# Patient Record
Sex: Female | Born: 1955
Health system: Southern US, Community
[De-identification: ages and names within clinical notes are randomized; demographics above are authoritative.]

## PROBLEM LIST (undated history)

## (undated) DIAGNOSIS — G8929 Other chronic pain: Secondary | ICD-10-CM

## (undated) DIAGNOSIS — I509 Heart failure, unspecified: Secondary | ICD-10-CM

## (undated) DIAGNOSIS — R109 Unspecified abdominal pain: Secondary | ICD-10-CM

## (undated) DIAGNOSIS — E119 Type 2 diabetes mellitus without complications: Secondary | ICD-10-CM

## (undated) DIAGNOSIS — M1990 Unspecified osteoarthritis, unspecified site: Secondary | ICD-10-CM

## (undated) DIAGNOSIS — M545 Low back pain, unspecified: Secondary | ICD-10-CM

## (undated) DIAGNOSIS — M1612 Unilateral primary osteoarthritis, left hip: Secondary | ICD-10-CM

## (undated) DIAGNOSIS — R37 Sexual dysfunction, unspecified: Secondary | ICD-10-CM

## (undated) DIAGNOSIS — I219 Acute myocardial infarction, unspecified: Secondary | ICD-10-CM

## (undated) DIAGNOSIS — Z0000 Encounter for general adult medical examination without abnormal findings: Secondary | ICD-10-CM

## (undated) DIAGNOSIS — I10 Essential (primary) hypertension: Secondary | ICD-10-CM

## (undated) DIAGNOSIS — N183 Chronic kidney disease, stage 3 unspecified: Secondary | ICD-10-CM

## (undated) DIAGNOSIS — IMO0001 Reserved for inherently not codable concepts without codable children: Secondary | ICD-10-CM

## (undated) DIAGNOSIS — J449 Chronic obstructive pulmonary disease, unspecified: Secondary | ICD-10-CM

## (undated) DIAGNOSIS — I739 Peripheral vascular disease, unspecified: Secondary | ICD-10-CM

## (undated) DIAGNOSIS — E7800 Pure hypercholesterolemia, unspecified: Secondary | ICD-10-CM

## (undated) DIAGNOSIS — J101 Influenza due to other identified influenza virus with other respiratory manifestations: Secondary | ICD-10-CM

## (undated) HISTORY — DX: Unilateral primary osteoarthritis, left hip: M16.12

## (undated) HISTORY — DX: Influenza due to other identified influenza virus with other respiratory manifestations: J10.1

## (undated) HISTORY — PX: EYE SURGERY: SHX253

## (undated) HISTORY — DX: Reserved for inherently not codable concepts without codable children: IMO0001

## (undated) HISTORY — PX: JOINT REPLACEMENT: SHX530

## (undated) HISTORY — PX: TUBAL LIGATION: SHX77

## (undated) HISTORY — DX: Chronic kidney disease, stage 3 unspecified: N18.30

## (undated) HISTORY — DX: Unspecified abdominal pain: R10.9

## (undated) HISTORY — DX: Encounter for general adult medical examination without abnormal findings: Z00.00

## (undated) HISTORY — DX: Sexual dysfunction, unspecified: R37

## (undated) HISTORY — PX: PERIPHERAL VASCULAR CATHETERIZATION: SHX172C

---

## 1998-01-31 ENCOUNTER — Emergency Department (HOSPITAL_COMMUNITY): Admission: EM | Admit: 1998-01-31 | Discharge: 1998-01-31 | Payer: Self-pay | Admitting: Emergency Medicine

## 1998-01-31 ENCOUNTER — Encounter: Payer: Self-pay | Admitting: Emergency Medicine

## 1998-02-26 ENCOUNTER — Ambulatory Visit (HOSPITAL_COMMUNITY): Admission: RE | Admit: 1998-02-26 | Discharge: 1998-02-26 | Payer: Self-pay | Admitting: Obstetrics

## 1998-02-26 ENCOUNTER — Other Ambulatory Visit: Admission: RE | Admit: 1998-02-26 | Discharge: 1998-02-26 | Payer: Self-pay | Admitting: Obstetrics

## 1998-03-23 ENCOUNTER — Observation Stay (HOSPITAL_COMMUNITY): Admission: AD | Admit: 1998-03-23 | Discharge: 1998-03-24 | Payer: Self-pay | Admitting: Obstetrics & Gynecology

## 1998-03-24 ENCOUNTER — Encounter: Payer: Self-pay | Admitting: Obstetrics

## 1999-06-22 ENCOUNTER — Other Ambulatory Visit: Admission: RE | Admit: 1999-06-22 | Discharge: 1999-06-22 | Payer: Self-pay | Admitting: Obstetrics

## 1999-08-25 ENCOUNTER — Ambulatory Visit (HOSPITAL_COMMUNITY): Admission: RE | Admit: 1999-08-25 | Discharge: 1999-08-25 | Payer: Self-pay | Admitting: Obstetrics

## 1999-08-25 ENCOUNTER — Encounter: Payer: Self-pay | Admitting: Obstetrics

## 2000-01-06 ENCOUNTER — Encounter (HOSPITAL_COMMUNITY): Admission: RE | Admit: 2000-01-06 | Discharge: 2000-01-21 | Payer: Self-pay | Admitting: Obstetrics

## 2000-01-10 ENCOUNTER — Encounter: Payer: Self-pay | Admitting: Obstetrics

## 2000-01-10 ENCOUNTER — Inpatient Hospital Stay (HOSPITAL_COMMUNITY): Admission: AD | Admit: 2000-01-10 | Discharge: 2000-01-10 | Payer: Self-pay | Admitting: Obstetrics

## 2000-01-20 ENCOUNTER — Inpatient Hospital Stay (HOSPITAL_COMMUNITY): Admission: RE | Admit: 2000-01-20 | Discharge: 2000-01-23 | Payer: Self-pay | Admitting: Obstetrics

## 2000-01-20 ENCOUNTER — Encounter: Payer: Self-pay | Admitting: Obstetrics

## 2000-01-20 ENCOUNTER — Encounter (INDEPENDENT_AMBULATORY_CARE_PROVIDER_SITE_OTHER): Payer: Self-pay | Admitting: Specialist

## 2001-03-08 ENCOUNTER — Emergency Department (HOSPITAL_COMMUNITY): Admission: EM | Admit: 2001-03-08 | Discharge: 2001-03-08 | Payer: Self-pay | Admitting: Emergency Medicine

## 2006-07-02 ENCOUNTER — Emergency Department (HOSPITAL_COMMUNITY): Admission: EM | Admit: 2006-07-02 | Discharge: 2006-07-02 | Payer: Self-pay | Admitting: Emergency Medicine

## 2007-10-02 ENCOUNTER — Inpatient Hospital Stay (HOSPITAL_COMMUNITY): Admission: EM | Admit: 2007-10-02 | Discharge: 2007-10-05 | Payer: Self-pay | Admitting: Emergency Medicine

## 2007-10-22 ENCOUNTER — Ambulatory Visit: Payer: Self-pay | Admitting: Family Medicine

## 2008-11-07 ENCOUNTER — Emergency Department (HOSPITAL_COMMUNITY): Admission: EM | Admit: 2008-11-07 | Discharge: 2008-11-07 | Payer: Self-pay | Admitting: Emergency Medicine

## 2010-08-08 LAB — GLUCOSE, CAPILLARY: Glucose-Capillary: 392 mg/dL — ABNORMAL HIGH (ref 70–99)

## 2010-08-08 LAB — BASIC METABOLIC PANEL
Chloride: 105 mEq/L (ref 96–112)
Creatinine, Ser: 1.04 mg/dL (ref 0.4–1.2)
GFR calc Af Amer: >60 mL/min (ref >60)
Potassium: 5.1 mEq/L (ref 3.5–5.1)
Sodium: 137 mEq/L (ref 135–145)

## 2010-08-08 LAB — URINALYSIS, ROUTINE W REFLEX MICROSCOPIC
Glucose, UA: >1000 mg/dL — AB
Leukocytes, UA: NEGATIVE
Protein, ur: NEGATIVE mg/dL
pH: 5.5 (ref 5.0–8.0)

## 2010-08-08 LAB — URINE MICROSCOPIC-ADD ON

## 2010-09-14 NOTE — H&P (Signed)
[name], [name]                ACCOUNT NO.:  [id number]   MEDICAL RECORD NO.:  [id number]          PATIENT TYPE:  INP   LOCATION:  2928                         FACILITY:  [facility]   PHYSICIAN:  Jana Hakim, M.D. DATE OF BIRTH:  [birth year]   DATE OF ADMISSION:  10/02/2007  DATE OF DISCHARGE:                              HISTORY & PHYSICAL   PRIMARY CARE PHYSICIAN:  Unassigned.   CHIEF COMPLAINT:  Blurred vision.   HISTORY OF PRESENT ILLNESS:  This is a 55 year old female presenting to  the emergency department with complaints of having dizziness and blurry  vision that she reports being worse over the past 24 hours.  The patient  reports having these symptoms off and on and reports having dysuria and  increased urinary frequency for the past month.  The patient does report  having weakness as well.  She denies having any fevers, chills, chest  pain or shortness of breath, nausea, vomiting or diarrhea.   The patient was evaluated in the emergency department and found to have  a blood sugar level of 661.  The patient was referred for admission and  placed on the IV insulin protocol and IV fluids.   PAST MEDICAL HISTORY:  None, per the patient's report.   PAST SURGICAL HISTORY:  None.   MEDICATIONS:  None.   ALLERGIES:  NO KNOWN DRUG ALLERGIES.   SOCIAL HISTORY:  Patient reports smoking rarely.  She reports drinking  three to four drinks on the weekend.  She also reports rare marijuana  usage and denies any illicit IV drug usage.   FAMILY HISTORY:  Noncontributory.   REVIEW OF SYSTEMS:  Pertinents are mentioned above.   PHYSICAL EXAMINATION FINDINGS:  This is a 55 year old well-nourished,  well-developed female in no acute distress.   VITAL SIGNS:  Temperature 98.0, blood pressure 176/91, heart rate 82,  respirations 22, O2 saturations 97% on room air.  HEENT:  Examination  normocephalic, atraumatic.  Pupils equally round and reactive to light.  Extraocular muscles  are intact.  Funduscopic benign.  Oropharynx is  clear.  NECK:  Supple full range of motion.  No thyromegaly, adenopathy or  jugular venous distention.  CARDIOVASCULAR:  Regular rate and rhythm.  No murmurs, gallops or rubs.  LUNGS:  Clear to auscultation bilaterally.  ABDOMEN:  Positive bowel sounds, soft, nontender, nondistended.  EXTREMITIES:  Without cyanosis, clubbing or edema.  NEUROLOGIC:  Nonfocal.   LABORATORY STUDIES:  CBC:  White blood cell count 5.6, hemoglobin 15.2,  hematocrit 45.5, platelets 226, neutrophils 69% lymphocytes 24%.  Sodium  131, potassium 4.0, chloride 100, bicarb 21, BUN 16, creatinine 1.1,  glucose 661.  Urine HCG negative.  Urinalysis:  Specific gravity 1.045,  urine glucose greater than 1000 and otherwise negative.  Serum acetone  level negative.  Cardiac Markers:  Myoglobin 104, CK-MB 4.3, troponin  less than 0.05.   ASSESSMENT:  This is a 55 year old female being admitted with:  1. Osmolar nonketotic hyperglycemia/new diabetes mellitus type 2.  2. Dehydration.  3. Elevated blood pressure.   PLAN:  The patient will be admitted to a  step-down unit area and will  continue on the IV insulin protocol.  IV fluids have been ordered for  fluid resuscitation.  The patient will be n.p.o. for now and her  electrolytes will be monitored.  Diabetic teaching will be ordered and  the patient will be placed on a diabetic diet, once she is able to take  p.o.  The patient will be transitioned to sliding-scale insulin coverage  and Lantus insulin therapy will also be initiated.  DVT and GI  prophylaxis have been ordered as well.  The patient's blood pressures  will be monitored and treated accordingly.      Jana Hakim, M.D.  Electronically Signed     HJ/MEDQ  D:  10/03/2007  T:  10/03/2007  Job:  [id number]

## 2010-09-14 NOTE — Discharge Summary (Signed)
[name], [name]                ACCOUNT NO.:  [id number]   MEDICAL RECORD NO.:  [id number]          PATIENT TYPE:  INP   LOCATION:  2029                         FACILITY:  [facility]   PHYSICIAN:  Rexene Alberts, M.D.    DATE OF BIRTH:  [birth year]   DATE OF ADMISSION:  10/02/2007  DATE OF DISCHARGE:  10/05/2007                               DISCHARGE SUMMARY   DISCHARGE DIAGNOSES:  1. Newly diagnosed uncontrolled type 2 diabetes mellitus.(Nonketotic      hyperglycemia with an admission glucose of 661).  2. Hypertension.  Newly diagnosed.  3. Bilateral hip arthropathy.  4. Dizziness.  5. Tobacco and alcohol use.   DISCHARGE MEDICATIONS:  1. Lantus insulin 15 units subcu each morning.  2. Glipizide 5 mg b.i.d.  3. Metformin 500 mg b.i.d.  4. Hydrochlorothiazide 25 mg daily.  5. Lisinopril 10 mg daily.  6. Trazodone 50 mg half a tablet each night as needed.  7. Vicodin 5 mg every 4 hours as needed for pain.  8. Aspirin 81 mg daily.   DISCHARGE DISPOSITION:  The patient is being discharged to home in  improved and stable condition.  She has a hospital follow-up with the  [facility] on October 22, 2007, at 2:15 p.m..   CONSULTATIONS:  None.   PROCEDURES PERFORMED:  1. CT scan of the head without contrast on October 02, 2007.  The results      revealed a normal noncontrasted head CT.  2. X-ray of the pelvis on October 02, 2007.  The results revealed advanced      bilateral hip arthropathy likely osteoarthritis.  No acute osseous      findings.   HISTORY OF PRESENT ILLNESS:  The patient is a 55 year old woman with no  significant past medical history prior to this hospitalization, who  presented to the emergency department on October 02, 2007, with a chief  complaint of blurred vision, generalized dizziness, and increased  urinary frequency.  When she was evaluated in the emergency department,  her venous glucose was found to be elevated at 661.  Her CO2 was within  normal limits at 21.   The patient was admitted for further evaluation  and management.   For additional details please see the dictated history and physical.   HOSPITAL COURSE:  1. NEWLY DIAGNOSED UNCONTROLLED TYPE 2 DIABETES MELLITUS.  The patient      was started on volume repletion with normal saline.  Subsequently,      the insulin Glucommander was started.  The patient's capillary      blood glucose was monitored frequently.  Over the course of the      first 24 hours, the insulin drip was discontinued and the patient      was started on sliding scale NovoLog as well as Lantus.  Her      CO2/bicarbonate remained within normal limits during the entire      hospital course;  she was not in DKA.  The  patient's electrolytes      remarkably remained stable throughout the entire hospitalization.   In addition to  the Lantus and sliding scale NovoLog, glipizide and  metformin were started.  The dosing of metformin and glipizide was  increased to b.i.d.  The diabetes coordinator and registered nurse  provided the patient with diabetes education and instructions on insulin  injections and home glucose monitoring.  The patient demonstrated good  understanding.  Her hemoglobin A1c was found to be quite elevated at  11.9.  The patient will be discharged to home on the medication regimen  as indicated above.  She was advised to follow up with the physician at  the [facility] as arranged.  1. HYPERTENSION.  The patient's blood pressure at the time of the      initial assessment in the emergency department was 182/93.      Throughout the hospitalization, the patient's blood pressures      remained elevated.  Therefore hydrochlorothiazide and lisinopril      were added for management.  The the lisinopril was titrated up to      10 mg daily.  The hydrochlorothiazide remains at 25 mg daily.      Further management will be deferred to the patient's new primary      care physician at the [facility].  2.  GENERALIZED DIZZINESS AND BLURRED VISION.  The patient complained      of dizziness and blurred vision at the time of the initial hospital      assessment.  These symptoms were thought to be secondary to      uncontrolled diabetes mellitus and possibly to autonomic      dysfuntion.  A CT scan of the head was ordered for evaluation and      it revealed no acute findings.  The patient's blurred vision and      dizziness subsided prior to hospital discharge.  She will need an      eye exam in the outpatient setting once her primary care has been      established.  3. CHRONIC BILATERAL HIP PAIN.  The patient complained of chronic hip      pain during the hospitalization.  She says that she has had hip      pain for a number of years, but never sought medical treatment.  X-      rays of her hips revealed significant degenerative joint changes.      There were no acute fractures or abnormalities.  The patient was      treated with as-needed Vicodin for pain.  She was advised to follow      up with her new primary care physician who may refer her to an      orthopedic physician for further evaluation and management.      Rexene Alberts, M.D.  Electronically Signed     DF/MEDQ  D:  10/05/2007  T:  10/06/2007  Job:  [id number]

## 2010-09-17 NOTE — Op Note (Signed)
[facility]  Patient:    [name], [name]                        MRN: [id number] Proc. Date: 01/22/00 Adm. Date:  [id number] Disc. Date: [id number] Attending:  Beatrix Fetters                           Operative Report  PREOPERATIVE DIAGNOSIS:       Multiparity.  POSTOPERATIVE DIAGNOSIS:      Multiparity.  OPERATION:                    Postpartum tubal ligation.  SURGEON:                      Frederico Hamman, M.D.  ASSISTANT:  ANESTHESIA:                   Epidural anesthesia.  DESCRIPTION OF PROCEDURE:     Using epidural, with the patient in the supine positive, abdomen prepped and draped, bladder emptied with straight catheter. A midline subumbilical incision was made and carried down to the fascia. The fascia clean and grasped with two Kochers. The fascia and the peritoneum opened with Mayo scissors. The left tube grasped in the mid portion with the Babcock clamp and the tube traced to the fimbria.  A 0 plain suture was placed in the mesosalpinx below the portion of tube in the clamp. This was tied and approximately one inch of the tube was transected. The procedure done in the exact fashion on the other side. Abdomen was closed in layers.  Peritoneum and fascia closed with continuous sutures of 0 Dexon and skin closed with subcuticular stitch of 3-0 plain.  The patient tolerated the procedure well and was taken to the recovery room in good condition. DD:  01/22/00 TD:  01/24/00 Job: [id number]

## 2010-09-17 NOTE — Discharge Summary (Signed)
[facility]  Patient:    [name], [name]                        MRN: [id number] Adm. Date:  [id number] Disc. Date: [id number] Attending:  Beatrix Fetters                           Discharge Summary  HISTORY OF PRESENT ILLNESS:   The patient is a 55 year old gravida 7, para 3-1-3-4, EDC of February 03, 2000, with a history of hypertension, chronic, on Norvasc 2.5 q.d.; and diabetic, who was treated with insulin during her pregnancy.  HOSPITAL COURSE:              She was brought in at 38 weeks and amniocentesis was performed which showed fetal lung maturity.  She received Cytotec and Pitocin and had a normal vaginal delivery of a 7 pound 5 ounce female with Apgars of 8 and 9.  Post delivery, the patients blood pressures were normal and her blood sugars were basically normal without insulin. She underwent a postpartum tubal on January 22, 2000, and did well.  She was discharged home on January 23, 2000, Tylenol No. 3 one q.4h. p.r.n. pain.  Hemoglobin 10.1.  FOLLOW-UP:                    She is to see me in six weeks.  DISCHARGE DIAGNOSIS:          Status post normal vaginal delivery, post induction at 38 weeks because of diabetes and postpartum tubal for multiparity. DD:  01/23/00 TD:  01/24/00 Job: [id number]

## 2011-01-27 LAB — POCT I-STAT, CHEM 8
BUN: 16
HCT: 50 — ABNORMAL HIGH
Hemoglobin: 17 — ABNORMAL HIGH
Sodium: 131 — ABNORMAL LOW
TCO2: 21

## 2011-01-27 LAB — CBC
HCT: 45.5
Hemoglobin: 13.2
Hemoglobin: 13.3
MCHC: 34.5
Platelets: 226
RBC: 4.19
RBC: 4.2
RDW: 14
WBC: 7.4

## 2011-01-27 LAB — BASIC METABOLIC PANEL
CO2: 24
CO2: 26
Calcium: 8.6
Calcium: 8.7
Calcium: 9
Calcium: 9.2
Chloride: 105
Chloride: 107
Chloride: 99
Creatinine, Ser: 0.71
Creatinine, Ser: 0.8
GFR calc Af Amer: >60
GFR calc Af Amer: >60
GFR calc Af Amer: >60
GFR calc Af Amer: >60
GFR calc Af Amer: >60
GFR calc non Af Amer: >60
GFR calc non Af Amer: >60
GFR calc non Af Amer: >60
GFR calc non Af Amer: >60
Potassium: 3.8
Potassium: 4.1
Sodium: 136
Sodium: 137
Sodium: 144

## 2011-01-27 LAB — TSH: TSH: 0.929

## 2011-01-27 LAB — DIFFERENTIAL
Basophils Absolute: 0
Basophils Relative: 0
Eosinophils Absolute: 0.1
Eosinophils Relative: 2
Lymphocytes Relative: 24

## 2011-01-27 LAB — HEMOGLOBIN A1C
Hgb A1c MFr Bld: 11.8 — ABNORMAL HIGH
Mean Plasma Glucose: 343
Mean Plasma Glucose: 346

## 2011-01-27 LAB — PHOSPHORUS: Phosphorus: 2.7

## 2011-01-27 LAB — KETONES, QUALITATIVE: Acetone, Bld: NEGATIVE

## 2011-01-27 LAB — URINALYSIS, ROUTINE W REFLEX MICROSCOPIC
Ketones, ur: NEGATIVE
Leukocytes, UA: NEGATIVE
Nitrite: NEGATIVE
Protein, ur: NEGATIVE

## 2011-01-27 LAB — MAGNESIUM: Magnesium: 2.1

## 2011-01-27 LAB — POCT CARDIAC MARKERS: Myoglobin, poc: 104

## 2011-05-10 ENCOUNTER — Emergency Department (HOSPITAL_COMMUNITY)
Admission: EM | Admit: 2011-05-10 | Discharge: 2011-05-10 | Disposition: A | Discharge status: Home / Self Care | Payer: Self-pay | Attending: Emergency Medicine | Admitting: Emergency Medicine

## 2011-05-10 ENCOUNTER — Encounter: Payer: Self-pay | Admitting: *Deleted

## 2011-05-10 ENCOUNTER — Emergency Department (HOSPITAL_COMMUNITY): Payer: Self-pay

## 2011-05-10 DIAGNOSIS — M129 Arthropathy, unspecified: Secondary | ICD-10-CM | POA: Insufficient documentation

## 2011-05-10 DIAGNOSIS — R000 Tachycardia, unspecified: Secondary | ICD-10-CM | POA: Insufficient documentation

## 2011-05-10 DIAGNOSIS — R739 Hyperglycemia, unspecified: Secondary | ICD-10-CM

## 2011-05-10 DIAGNOSIS — I10 Essential (primary) hypertension: Secondary | ICD-10-CM | POA: Insufficient documentation

## 2011-05-10 DIAGNOSIS — R05 Cough: Secondary | ICD-10-CM | POA: Insufficient documentation

## 2011-05-10 DIAGNOSIS — E119 Type 2 diabetes mellitus without complications: Secondary | ICD-10-CM | POA: Insufficient documentation

## 2011-05-10 DIAGNOSIS — R059 Cough, unspecified: Secondary | ICD-10-CM | POA: Insufficient documentation

## 2011-05-10 DIAGNOSIS — R0609 Other forms of dyspnea: Secondary | ICD-10-CM | POA: Insufficient documentation

## 2011-05-10 DIAGNOSIS — R6883 Chills (without fever): Secondary | ICD-10-CM | POA: Insufficient documentation

## 2011-05-10 DIAGNOSIS — J189 Pneumonia, unspecified organism: Secondary | ICD-10-CM | POA: Insufficient documentation

## 2011-05-10 DIAGNOSIS — R0682 Tachypnea, not elsewhere classified: Secondary | ICD-10-CM | POA: Insufficient documentation

## 2011-05-10 DIAGNOSIS — R0989 Other specified symptoms and signs involving the circulatory and respiratory systems: Secondary | ICD-10-CM | POA: Insufficient documentation

## 2011-05-10 HISTORY — DX: Essential (primary) hypertension: I10

## 2011-05-10 HISTORY — DX: Unspecified osteoarthritis, unspecified site: M19.90

## 2011-05-10 LAB — GLUCOSE, CAPILLARY: Glucose-Capillary: 443 mg/dL — ABNORMAL HIGH (ref 70–99)

## 2011-05-10 MED ORDER — METFORMIN HCL 500 MG PO TABS: 500.0000 mg | ORAL_TABLET | Freq: Two times a day (BID) | ORAL | Status: DC

## 2011-05-10 MED ORDER — AMOXICILLIN 500 MG PO CAPS
1000.0000 mg | ORAL_CAPSULE | Freq: Once | ORAL | Status: AC
  Administered 2011-05-10: 1000 mg via ORAL
  Filled 2011-05-10: qty 2

## 2011-05-10 MED ORDER — LISINOPRIL 20 MG PO TABS: 10.0000 mg | ORAL_TABLET | Freq: Every day | ORAL | Status: DC

## 2011-05-10 MED ORDER — INSULIN ASPART 100 UNIT/ML ~~LOC~~ SOLN
10.0000 [IU] | Freq: Once | SUBCUTANEOUS | Status: AC
  Administered 2011-05-10: 10 [IU] via SUBCUTANEOUS
  Filled 2011-05-10: qty 1

## 2011-05-10 MED ORDER — GLIPIZIDE 10 MG PO TABS: 5.0000 mg | ORAL_TABLET | Freq: Two times a day (BID) | ORAL | Status: DC

## 2011-05-10 MED ORDER — GLIPIZIDE 5 MG PO TABS: 5.0000 mg | ORAL_TABLET | Freq: Two times a day (BID) | ORAL | Status: DC

## 2011-05-10 MED ORDER — AMOXICILLIN 500 MG PO CAPS: 1000.0000 mg | ORAL_CAPSULE | Freq: Three times a day (TID) | ORAL | Status: AC

## 2011-05-10 MED ORDER — HYDROCHLOROTHIAZIDE 25 MG PO TABS: 25.0000 mg | ORAL_TABLET | Freq: Every day | ORAL | Status: DC

## 2011-05-10 MED ORDER — ALBUTEROL SULFATE (5 MG/ML) 0.5% IN NEBU
2.5000 mg | INHALATION_SOLUTION | Freq: Once | RESPIRATORY_TRACT | Status: AC
  Administered 2011-05-10: 2.5 mg via RESPIRATORY_TRACT
  Filled 2011-05-10: qty 0.5

## 2011-05-10 MED ORDER — ALBUTEROL SULFATE HFA 108 (90 BASE) MCG/ACT IN AERS
2.0000 | INHALATION_SPRAY | RESPIRATORY_TRACT | Status: DC | PRN
  Administered 2011-05-10: 2 via RESPIRATORY_TRACT
  Filled 2011-05-10: qty 6.7

## 2011-05-10 NOTE — ED Notes (Signed)
Patient completed Neb treatment. Patient states she is breathing better ans is with NAD at this time.

## 2011-05-10 NOTE — ED Notes (Signed)
EDP at bedside speaking with pt and family

## 2011-05-10 NOTE — ED Notes (Addendum)
C/o productive cough with generalized body aches x >81month. States slight sob with ambulation. Pt speaking in complete sentences. NAD. Pt also hypertensive but is not taking medicine.

## 2011-05-10 NOTE — ED Provider Notes (Signed)
History     CSN: [id number]  Arrival date & time 05/10/11  1249   First MD Initiated Contact with Patient 05/10/11 1332      Chief Complaint  Patient presents with  . Cough    (Consider location/radiation/quality/duration/timing/severity/associated sxs/prior treatment) Patient is a 56 y.o. female presenting with cough. The history is provided by the patient.  Cough  Her and present for the last month and is neither getting better nor worse. She initially had a subjective fever but has not had a fever for about the last 4 weeks. She occasionally has some mild chills but has not had any sweats. Cough is occasionally productive of a small amount of green sputum, but is mostly nonproductive. There has been associated dyspnea which is worse when she climbs steps. Her cough is worse at night. She's tried using a variety of over-the-counter cough and cold medications which have not given her any relief. She has not had any chest pain, heaviness, tightness, or pressure. She has not had any nausea, vomiting, diarrhea. She denies arthralgias or myalgias.  She has a history of hypertension and diabetes, but states that she has been off of her medications for over 2 years  related to not being able to afford to see a doctor. She does not report her medications were.  Past Medical History  Diagnosis Date  . Hypertension   . Diabetes mellitus   . Arthritis     No past surgical history on file.  No family history on file.  History  Substance Use Topics  . Smoking status: Not on file  . Smokeless tobacco: Not on file  . Alcohol Use: Yes    OB History    Grav Para Term Preterm Abortions TAB SAB Ect Mult Living                  Review of Systems  Respiratory: Positive for cough.   All other systems reviewed and are negative.    Allergies  Review of patient's allergies indicates no known allergies.  Home Medications   Current Outpatient Rx  Name Route Sig Dispense Refill  .  IBUPROFEN 200 MG PO TABS Oral Take 200 mg by mouth every 6 (six) hours as needed. For hip pain       BP 190/89  Pulse 101  Temp(Src) 98.3 F (36.8 C) (Oral)  Resp 24  SpO2 96%  Physical Exam  Nursing note and vitals reviewed.  56 year old female who is resting comfortably and in no acute distress. Vital signs are abnormal with mild tachycardia of 101, mild tachypnea of 24 and moderate hypertension with blood pressure 190/89. Oxygen saturation is 96% which is normal. She is afebrile. Head is normocephalic and atraumatic. PERRLA, EOMI. Oropharynx is clear. Neck is supple without adenopathy or JVD. Lungs have a few rales at the right base and a prolonged exhalation phase. There no overt wheezes or rhonchi. Heart has regular rate and rhythm without murmur. Back is nontender and chest wall is nontender. Abdomen is soft, flat, nontender without masses or hepatosplenomegaly. Extremities have no cyanosis or edema. Skin is warm and moist without rash. Neurologic: Mental status is normal, cranial nerves are intact, there no focal motor Center deficits. Psychiatric: No abnormalities of mood or affect.  ED Course  Procedures (including critical care time)   Labs Reviewed  POCT CBG MONITORING   No results found. Results for orders placed during the hospital encounter of 05/10/11  GLUCOSE, CAPILLARY  Component Value Range   Glucose-Capillary 443 (*) 70 - 99 (mg/dL)   Dg Chest 2 View  05/10/2011  *RADIOLOGY REPORT*  Clinical Data: Cough; hypertension diabetes  CHEST - 2 VIEW  Comparison: None.  Findings: There is patchy opacity within the left mid lung and right infrahilar region.  The cardiac silhouette, mediastinum, pulmonary vasculature are within normal limits. The osseous structures are unremarkable.a  IMPRESSION: There are patchy opacities within the left mid lung and right infrahilar region which are worrisome for pneumonia.  Original Report Authenticated By: Duayne Cal, M.D.      No  diagnosis found.  She feels much better after an albuterol with nebulizer treatment. Blood sugar is noted to be fairly high, so she is given a dose of insulin in the emergency department. She will need to be restarted on blood pressure and blood sugar medication. Prescriptions are written for metformin, glipizide, lisinopril, and hydrochlorothiazide. She's given an albuterol inhaler to use at home. She is given a prescription for amoxicillin for her pneumonia. Importance of followup for blood pressure and blood sugar was stressed.  MDM  Persistent cough which most likely represents bronchitis. Hypertension related to medication noncompliance. She also has medication noncompliance related to her diabetes. She will need to have a CBC checked to see how high her blood sugar is. Chest x-rays been ordered to rule out pneumonia and she will be given an albuterol nebulizer treatment to assess response. Her old records were reviewed and the last report of her medications was a hospital discharge summary from 2009 which stated that she was on Lantus insulin 15 units daily, glipizide 5 mg twice a day, metformin 500 mg twice a day, hydrochlorothiazide 25 mg daily, and lisinopril 10 mg daily. Patient was asked if this sounded correct and she states that she cannot remember what the medication names were but it sounded accurate to her.        Delora Fuel, MD [id number]

## 2011-09-14 ENCOUNTER — Emergency Department (HOSPITAL_COMMUNITY)
Admission: EM | Admit: 2011-09-14 | Discharge: 2011-09-14 | Disposition: A | Discharge status: Home / Self Care | Payer: Self-pay | Attending: Emergency Medicine | Admitting: Emergency Medicine

## 2011-09-14 ENCOUNTER — Encounter (HOSPITAL_COMMUNITY): Payer: Self-pay | Admitting: Emergency Medicine

## 2011-09-14 DIAGNOSIS — M436 Torticollis: Secondary | ICD-10-CM | POA: Insufficient documentation

## 2011-09-14 DIAGNOSIS — S30860A Insect bite (nonvenomous) of lower back and pelvis, initial encounter: Secondary | ICD-10-CM | POA: Insufficient documentation

## 2011-09-14 DIAGNOSIS — E119 Type 2 diabetes mellitus without complications: Secondary | ICD-10-CM | POA: Insufficient documentation

## 2011-09-14 DIAGNOSIS — M129 Arthropathy, unspecified: Secondary | ICD-10-CM | POA: Insufficient documentation

## 2011-09-14 DIAGNOSIS — Z79899 Other long term (current) drug therapy: Secondary | ICD-10-CM | POA: Insufficient documentation

## 2011-09-14 DIAGNOSIS — I10 Essential (primary) hypertension: Secondary | ICD-10-CM | POA: Insufficient documentation

## 2011-09-14 DIAGNOSIS — M542 Cervicalgia: Secondary | ICD-10-CM | POA: Insufficient documentation

## 2011-09-14 DIAGNOSIS — W57XXXA Bitten or stung by nonvenomous insect and other nonvenomous arthropods, initial encounter: Secondary | ICD-10-CM | POA: Insufficient documentation

## 2011-09-14 MED ORDER — IBUPROFEN 800 MG PO TABS
800.0000 mg | ORAL_TABLET | Freq: Once | ORAL | Status: AC
  Administered 2011-09-14: 800 mg via ORAL
  Filled 2011-09-14: qty 1

## 2011-09-14 MED ORDER — METHOCARBAMOL 500 MG PO TABS: 500.0000 mg | ORAL_TABLET | Freq: Four times a day (QID) | ORAL | Status: AC | PRN

## 2011-09-14 MED ORDER — DIAZEPAM 5 MG PO TABS
5.0000 mg | ORAL_TABLET | Freq: Once | ORAL | Status: AC
  Administered 2011-09-14: 5 mg via ORAL
  Filled 2011-09-14: qty 1

## 2011-09-14 MED ORDER — IBUPROFEN 600 MG PO TABS: 600.0000 mg | ORAL_TABLET | Freq: Four times a day (QID) | ORAL | Status: DC | PRN

## 2011-09-14 NOTE — ED Notes (Signed)
Family at bedside. Area in coccyx that tick was removed, intact. No inflammation noted

## 2011-09-14 NOTE — ED Notes (Signed)
Pt reports neck pain x 2 days. Pain unresponsive to OTC meds Hx of tick removal in coccyx area

## 2011-09-14 NOTE — ED Provider Notes (Signed)
Medical screening examination/treatment/procedure(s) were performed by non-physician practitioner and as supervising physician I was immediately available for consultation/collaboration. Rolland Porter, MD, Abram Sander   Janice Norrie, MD 09/14/11 1257

## 2011-09-14 NOTE — ED Provider Notes (Signed)
History     CSN: [id number]  Arrival date & time 09/14/11  1213   First MD Initiated Contact with Patient 09/14/11 1228      Chief Complaint  Patient presents with  . Tick Removal    Pt removed 2 days ago. Denies pain in coccyx -where tick   . Neck Pain    denies injury, pain x 2 days    (Consider location/radiation/quality/duration/timing/severity/associated sxs/prior treatment) Patient is a 56 y.o. female presenting with neck pain. The history is provided by the patient.  Neck Pain  This is a new problem. The current episode started yesterday. The problem occurs constantly. The problem has not changed since onset.The pain is associated with nothing (awoke with pain). There has been no fever. The pain is present in the left side. The quality of the pain is described as stabbing. The pain does not radiate. The pain is severe. The symptoms are aggravated by bending and twisting. The pain is the same all the time. Pertinent negatives include no photophobia, no visual change, no chest pain, no syncope, no numbness, no headaches, no paresis and no weakness. She has tried nothing for the symptoms.  pt also with report of tick bite to gluteal cleft 2 days ago. Tick removed by husband who reports seeing an intact insect after removal. Pt denies fever, HA, rash. No pain at the site. No prior eval.  Past Medical History  Diagnosis Date  . Hypertension   . Diabetes mellitus   . Arthritis     History reviewed. No pertinent past surgical history.  Family History  Problem Relation Age of Onset  . Diabetes Mother     History  Substance Use Topics  . Smoking status: Never Smoker   . Smokeless tobacco: Not on file  . Alcohol Use: Yes      Review of Systems  Constitutional: Negative for fever and chills.  HENT: Positive for neck pain. Negative for sore throat and neck stiffness.   Eyes: Negative for photophobia and visual disturbance.  Respiratory: Negative for shortness of breath.     Cardiovascular: Negative for chest pain and syncope.  Gastrointestinal: Negative for nausea and abdominal pain.  Musculoskeletal:       See HPI, otherwise negative  Skin: Negative for rash.       See HPI, otherwise neg  Neurological: Negative for weakness, numbness and headaches.  Psychiatric/Behavioral: Negative for confusion.    Allergies  Review of patient's allergies indicates no known allergies.  Home Medications   Current Outpatient Rx  Name Route Sig Dispense Refill  . GLIPIZIDE 5 MG PO TABS Oral Take 5 mg by mouth daily.    Marland Kitchen HYDROCHLOROTHIAZIDE 25 MG PO TABS Oral Take 1 tablet (25 mg total) by mouth daily. 30 tablet 0  . IBUPROFEN 200 MG PO TABS Oral Take 200 mg by mouth every 6 (six) hours as needed. For hip pain     . LISINOPRIL 20 MG PO TABS Oral Take 0.5 tablets (10 mg total) by mouth daily. 30 tablet 0  . METFORMIN HCL 500 MG PO TABS Oral Take 1 tablet (500 mg total) by mouth 2 (two) times daily with a meal. 60 tablet 0    BP 146/86  Pulse 98  Temp 97.7 F (36.5 C)  Resp 18  SpO2 100%  Physical Exam  Nursing note and vitals reviewed. Constitutional: She is oriented to person, place, and time. She appears well-developed and well-nourished.       Uncomfortable appearing.  HENT:  Head: Normocephalic and atraumatic.  Right Ear: External ear normal.  Left Ear: External ear normal.  Mouth/Throat: Oropharynx is clear and moist.  Eyes: Pupils are equal, round, and reactive to light.  Neck: Neck supple. Muscular tenderness present. No spinous process tenderness present. No rigidity. Decreased range of motion present.    Cardiovascular: Normal rate, regular rhythm and normal heart sounds.   No murmur heard.      Bilateral radial pulses 2+  Pulmonary/Chest: Effort normal and breath sounds normal. No respiratory distress. She has no wheezes. She has no rales.  Musculoskeletal: She exhibits no edema. Tenderness: see neck exam.  Neurological: She is alert and  oriented to person, place, and time.       5/5 and symmetric strength in BUE. Sensation intact to light touch in BUE.   Skin: Skin is warm and dry. No rash noted.       69mm area of slight erythema to gluteal cleft, no insect remnants visible. Non-tender.  Psychiatric: She has a normal mood and affect.    ED Course  Procedures (including critical care time)  Labs Reviewed - No data to display No results found.   1. Tick bite   2. Torticollis       MDM  Tick bite 2 days ago, appears to have been completely removed. No HA, fever, rash, joint pain to suggest subsequent infection. Warning signs discussed with pt, advised immediate re-eval with PCP or ED if symptoms arise.  Left sided neck pain, present on waking yesterday. Reproducible, worse with movement. No neuro deficit or midline pain. No imaging indicated. Discussed tx of muscle spasm with pt and spouse. Warning signs discussed.        Orlie Dakin, PA-C 09/14/11 1256

## 2011-09-14 NOTE — Discharge Instructions (Signed)
Deer Tick Bite Deer ticks are brown arachnids (spider family) that vary in size from as small as the head of a pin to 1/4 inch (1/2 cm) diameter. They thrive in wooded areas. Deer are the preferred host of adult deer ticks. Small rodents are the host of young ticks (nymphs). When a person walks in a field or wooded area, young and adult ticks in the surrounding grass and vegetation can attach themselves to the skin. They can suck blood for hours to days if unnoticed. Ticks are found all over the [place] Some ticks carry a specific bacteria (Borrelia burgdorferi) that causes an infection called Lyme disease. The bacteria is typically passed into a person during the blood sucking process. This happens after the tick has been attached for at least a number of hours. While ticks can be found all over the [place] those carrying the bacteria that causes Lyme disease are most common in [place]. Only a small proportion of ticks in these areas carry the Lyme disease bacteria and cause human infections. Ticks usually attach to warm spots on the body, such as the:  Head.   Back.   Neck.   Armpits.   Groin.  SYMPTOMS  Most of the time, a deer tick bite will not be felt. You may or may not see the attached tick. You may notice mild irritation or redness around the bite site. If the deer tick passes the Lyme disease bacteria to a person, a round, red rash may be noticed 2 to 3 days after the bite. The rash may be clear in the middle, like a bull's-eye or target. If not treated, other symptoms may develop several days to weeks after the onset of the rash. These symptoms may include:  New rash lesions.   Fatigue and weakness.   General ill feeling and achiness.   Chills.   Headache and neck pain.   Swollen lymph glands.   Sore muscles and joints.  5 to 15% of untreated people with Lyme disease may develop more severe illnesses after several weeks to months. This may include inflammation  of the brain lining (meningitis), nerve palsies, an abnormal heartbeat, or severe muscle and joint pain and inflammation (myositis or arthritis). DIAGNOSIS   Physical exam and medical history.   Viewing the tick if it was saved for confirmation.   Blood tests (to check or confirm the presence of Lyme disease).  TREATMENT  Most ticks do not carry disease. If found, an attached tick should be removed using tweezers. Tweezers should be placed under the body of the tick so it is removed by its attachment parts (pincers). If there are signs or symptoms of being sick, or Lyme disease is confirmed, medicines (antibiotics) that kill germs are usually prescribed. In more severe cases, antibiotics may be given through an intravenous (IV) access. HOME CARE INSTRUCTIONS   Always remove ticks with tweezers. Do not use petroleum jelly or other methods to kill or remove the tick. Slide the tweezers under the body and pull out as much as you can. If you are not sure what it is, save it in a jar and show your caregiver.   Once you remove the tick, the skin will heal on its own. Wash your hands and the affected area with water and soap. You may place a bandage on the affected area.   Take medicine as directed. You may be advised to take a full course of antibiotics.   Follow up  with your caregiver as recommended.  FINDING OUT THE RESULTS OF YOUR TEST Not all test results are available during your visit. If your test results are not back during the visit, make an appointment with your caregiver to find out the results. Do not assume everything is normal if you have not heard from your caregiver or the medical facility. It is important for you to follow up on all of your test results. PROGNOSIS  If Lyme disease is confirmed, early treatment with antibiotics is very effective. Following preventive guidelines is important since it is possible to get the disease more than once. PREVENTION   Wear long sleeves and  long pants in wooded or grassy areas. Tuck your pants into your socks.   Use an insect repellent while hiking.   Check yourself, your children, and your pets regularly for ticks after playing outside.   Clear piles of leaves or brush from your yard. Ticks might live there.  SEEK MEDICAL CARE IF:   You or your child has an oral temperature above 102 F (38.9 C).   You develop a severe headache following the bite.   You feel generally ill.   You notice a rash.   You are having trouble removing the tick.   The bite area has red skin or yellow drainage.  SEEK IMMEDIATE MEDICAL CARE IF:   Your face is weak and droopy or you have other neurological symptoms.   You have severe joint pain or weakness.  MAKE SURE YOU:   Understand these instructions.   Will watch your condition.   Will get help right away if you are not doing well or get worse.  FOR MORE INFORMATION Centers for Disease Control and Prevention: http://www.wolf.info/ American Academy [employer]icians: www.AromatherapyParty.no Document Released: 07/13/2009 Document Revised: 04/07/2011 Document Reviewed: 07/13/2009 [facility] Patient Information 2012 [place].        Torticollis, Acute You have suddenly (acutely) developed a twisted neck (torticollis). This is usually a self-limited condition. CAUSES  Acute torticollis may be caused by malposition, trauma or infection. Most commonly, acute torticollis is caused by sleeping in an awkward position. Torticollis may also be caused by the flexion, extension or twisting of the neck muscles beyond their normal position. Sometimes, the exact cause may not be known. SYMPTOMS  Usually, there is pain and limited movement of the neck. Your neck may twist to one side. DIAGNOSIS  The diagnosis is often made by physical examination. X-rays, CT scans or MRIs may be done if there is a history of trauma or concern of infection. TREATMENT  For a common, stiff neck that develops during sleep,  treatment is focused on relaxing the contracted neck muscle. Medications (including shots) may be used to treat the problem. Most cases resolve in several days. Torticollis usually responds to conservative physical therapy. If left untreated, the shortened and spastic neck muscle can cause deformities in the face and neck. Rarely, surgery is required. HOME CARE INSTRUCTIONS   Use over-the-counter and prescription medications as directed by your caregiver.   Do stretching exercises and massage the neck as directed by your caregiver.   Follow up with physical therapy if needed and as directed by your caregiver.  SEEK IMMEDIATE MEDICAL CARE IF:   You develop difficulty breathing or noisy breathing (stridor).   You drool, develop trouble swallowing or have pain with swallowing.   You develop numbness or weakness in the hands or feet.   You have changes in speech or vision.  You have problems with urination or bowel movements.   You have difficulty walking.   You have a fever.   You have increased pain.  MAKE SURE YOU:   Understand these instructions.   Will watch your condition.   Will get help right away if you are not doing well or get worse.  Document Released: 04/15/2000 Document Revised: 04/07/2011 Document Reviewed: 05/27/2009 [facility] Patient Information 2012 [place].

## 2011-09-29 ENCOUNTER — Encounter (HOSPITAL_COMMUNITY): Payer: Self-pay | Admitting: Emergency Medicine

## 2011-09-29 ENCOUNTER — Emergency Department (HOSPITAL_COMMUNITY)
Admission: EM | Admit: 2011-09-29 | Discharge: 2011-09-29 | Disposition: A | Discharge status: Home / Self Care | Payer: Self-pay | Attending: Emergency Medicine | Admitting: Emergency Medicine

## 2011-09-29 DIAGNOSIS — E119 Type 2 diabetes mellitus without complications: Secondary | ICD-10-CM | POA: Insufficient documentation

## 2011-09-29 DIAGNOSIS — Z79899 Other long term (current) drug therapy: Secondary | ICD-10-CM | POA: Insufficient documentation

## 2011-09-29 DIAGNOSIS — I10 Essential (primary) hypertension: Secondary | ICD-10-CM | POA: Insufficient documentation

## 2011-09-29 DIAGNOSIS — M129 Arthropathy, unspecified: Secondary | ICD-10-CM | POA: Insufficient documentation

## 2011-09-29 DIAGNOSIS — L0889 Other specified local infections of the skin and subcutaneous tissue: Secondary | ICD-10-CM | POA: Insufficient documentation

## 2011-09-29 DIAGNOSIS — L0390 Cellulitis, unspecified: Secondary | ICD-10-CM

## 2011-09-29 MED ORDER — DOXYCYCLINE HYCLATE 100 MG PO CAPS: 100.0000 mg | ORAL_CAPSULE | Freq: Two times a day (BID) | ORAL | Status: AC

## 2011-09-29 NOTE — Discharge Instructions (Signed)

## 2011-09-29 NOTE — ED Notes (Signed)
Pt presenting to ed with c/o bumps in her scalp pt states "I put a hot oil treatment in it yesterday but I'm not sure what it is" pt states she also got bit by a tick x 2 weeks ago but she has some redness to the area with swelling and pus.

## 2011-09-29 NOTE — ED Provider Notes (Signed)
History     CSN: [id number]  Arrival date & time 09/29/11  1803   First MD Initiated Contact with Patient 09/29/11 1823      6:29 PM HPI Patient has multiple complaints. States she was here 2 weeks ago do to a tic bite. Reports tick was removed and she was advised return for changing symptoms. States within the last few days she has noticed redness around the same area concerned for infection. Denies rash on hands are high fevers.   Also states she's here for a rash just above the bilateral ears. Reports area is extremely itchy and feels swollen. Denies change in shampoo. Reports she did have hot oil therapy in her hair but she has done this for years. Denies ear pain or swollen lymph nodes  Finally she's also here for peeling toes. Reports on her left foot, the skin on her  toes are peeling. Denies pain. Reports her toes seem more red than usual. States that weeks ago her foot was run over it she denied having any pain he did not come to the emergency department. States since then the skin has been peeling. HPI  Past Medical History  Diagnosis Date  . Hypertension   . Diabetes mellitus   . Arthritis     History reviewed. No pertinent past surgical history.  Family History  Problem Relation Age of Onset  . Diabetes Mother     History  Substance Use Topics  . Smoking status: Never Smoker   . Smokeless tobacco: Not on file  . Alcohol Use: No     ocassionally    OB History    Grav Para Term Preterm Abortions TAB SAB Ect Mult Living                  Review of Systems  Constitutional: Negative for fever and chills.  Gastrointestinal: Negative for nausea and vomiting.  Musculoskeletal: Negative for myalgias and arthralgias.  Skin: Positive for color change and rash.       Peeling skin of toes, skin infection due to tick bite, rash above ears  Neurological: Negative for dizziness, light-headedness and headaches.  All other systems reviewed and are  negative.    Allergies  Review of patient's allergies indicates no known allergies.  Home Medications   Current Outpatient Rx  Name Route Sig Dispense Refill  . GLIPIZIDE 5 MG PO TABS Oral Take 5 mg by mouth daily.    Marland Kitchen HYDROCHLOROTHIAZIDE 25 MG PO TABS Oral Take 1 tablet (25 mg total) by mouth daily. 30 tablet 0  . IBUPROFEN 600 MG PO TABS Oral Take 1 tablet (600 mg total) by mouth every 6 (six) hours as needed for pain. For neck pain 30 tablet 0  . LISINOPRIL 20 MG PO TABS Oral Take 0.5 tablets (10 mg total) by mouth daily. 30 tablet 0  . METFORMIN HCL 500 MG PO TABS Oral Take 1 tablet (500 mg total) by mouth 2 (two) times daily with a meal. 60 tablet 0    BP 150/68  Pulse 112  Temp(Src) 98.8 F (37.1 C) (Oral)  Resp 18  Ht 5\' 3"  (1.6 m)  Wt 150 lb (68.04 kg)  BMI 26.57 kg/m2  SpO2 99%  Physical Exam  Vitals reviewed. Constitutional: She is oriented to person, place, and time. Vital signs are normal. She appears well-developed and well-nourished. No distress.  HENT:  Head: Normocephalic and atraumatic.  Right Ear: External ear normal.  Left Ear: External ear normal.  Mouth/Throat: Oropharynx is clear and moist. No oropharyngeal exudate.  Eyes: Pupils are equal, round, and reactive to light.  Neck: Neck supple.  Pulmonary/Chest: Effort normal.  Neurological: She is alert and oriented to person, place, and time.  Skin: Skin is warm and dry. Rash noted. No erythema. No pallor.       Left pilonidal area has a small erythematous bumps. Surrounding erythema approximately the size of a quarter. No fluctuance, no mass, no induration. Potential infection or beginning abscess.   Inflamed area right above the ears. Right ear appears to have dry erythematous changes. Nontender, and no purulent drainage, no mass. TMs normal    Left great toe desquamated but nontender full range of motion mild erythema on plantar surface normal appearance of toe nail. Do not expect a fungal  infection.   Psychiatric: She has a normal mood and affect. Her behavior is normal.    ED Course  Procedures   MDM  Treat patient for skin infection with doxycycline. It should be sufficient to treat rash years however recommend patient followup with her primary care physician who she states she "didn't even think to call"if symptoms fail to improve. Patient voices understanding and is ready for,   Sheliah Mends, PA-C 09/29/11 1851

## 2011-10-05 NOTE — ED Provider Notes (Signed)
Medical screening examination/treatment/procedure(s) were performed by non-physician practitioner and as supervising physician I was immediately available for consultation/collaboration.  Virgel Manifold, MD 10/05/11 [phone]

## 2011-12-20 ENCOUNTER — Emergency Department (HOSPITAL_COMMUNITY): Payer: Medicaid Other

## 2011-12-20 ENCOUNTER — Emergency Department (HOSPITAL_COMMUNITY)
Admission: EM | Admit: 2011-12-20 | Discharge: 2011-12-20 | Disposition: A | Discharge status: Home / Self Care | Payer: Medicaid Other | Attending: Emergency Medicine | Admitting: Emergency Medicine

## 2011-12-20 ENCOUNTER — Encounter (HOSPITAL_COMMUNITY): Payer: Self-pay | Admitting: *Deleted

## 2011-12-20 DIAGNOSIS — R209 Unspecified disturbances of skin sensation: Secondary | ICD-10-CM | POA: Insufficient documentation

## 2011-12-20 DIAGNOSIS — M549 Dorsalgia, unspecified: Secondary | ICD-10-CM | POA: Insufficient documentation

## 2011-12-20 DIAGNOSIS — I10 Essential (primary) hypertension: Secondary | ICD-10-CM | POA: Insufficient documentation

## 2011-12-20 DIAGNOSIS — I739 Peripheral vascular disease, unspecified: Secondary | ICD-10-CM | POA: Insufficient documentation

## 2011-12-20 DIAGNOSIS — M79605 Pain in left leg: Secondary | ICD-10-CM

## 2011-12-20 DIAGNOSIS — R0989 Other specified symptoms and signs involving the circulatory and respiratory systems: Secondary | ICD-10-CM

## 2011-12-20 DIAGNOSIS — I70219 Atherosclerosis of native arteries of extremities with intermittent claudication, unspecified extremity: Secondary | ICD-10-CM

## 2011-12-20 DIAGNOSIS — M79609 Pain in unspecified limb: Secondary | ICD-10-CM | POA: Insufficient documentation

## 2011-12-20 DIAGNOSIS — E119 Type 2 diabetes mellitus without complications: Secondary | ICD-10-CM | POA: Insufficient documentation

## 2011-12-20 LAB — APTT: aPTT: 33 seconds (ref 24–37)

## 2011-12-20 LAB — CBC WITH DIFFERENTIAL/PLATELET
Basophils Absolute: 0 10*3/uL (ref 0.0–0.1)
Basophils Relative: 0 % (ref 0–1)
HCT: 34.8 % — ABNORMAL LOW (ref 36.0–46.0)
Hemoglobin: 11.5 g/dL — ABNORMAL LOW (ref 12.0–15.0)
Lymphocytes Relative: 33 % (ref 12–46)
MCHC: 33 g/dL (ref 30.0–36.0)
Monocytes Absolute: 0.5 10*3/uL (ref 0.1–1.0)
Monocytes Relative: 7 % (ref 3–12)
Neutro Abs: 4.3 10*3/uL (ref 1.7–7.7)
Neutrophils Relative %: 55 % (ref 43–77)
RDW: 14.9 % (ref 11.5–15.5)
WBC: 7.7 10*3/uL (ref 4.0–10.5)

## 2011-12-20 LAB — BASIC METABOLIC PANEL
CO2: 28 mEq/L (ref 19–32)
Chloride: 100 mEq/L (ref 96–112)
Creatinine, Ser: 1.36 mg/dL — ABNORMAL HIGH (ref 0.50–1.10)
GFR calc Af Amer: 50 mL/min — ABNORMAL LOW (ref >90)
Potassium: 4 mEq/L (ref 3.5–5.1)

## 2011-12-20 LAB — PROTIME-INR
INR: 0.99 (ref 0.00–1.49)
Prothrombin Time: 13.3 seconds (ref 11.6–15.2)

## 2011-12-20 NOTE — ED Notes (Signed)
Lt. Great toe is black where her nail was. No bleeding or sign os infection.  Top of her lt. Great toe is red.  Applied 2 X 2 and sterile gauze.   Pt. Is also has a post-op shoe

## 2011-12-20 NOTE — ED Provider Notes (Signed)
History     CSN: [id number]  Arrival date & time 12/20/11  0921   First MD Initiated Contact with Patient 12/20/11 [phone]      Chief Complaint  Patient presents with  . Leg Pain    left    (Consider location/radiation/quality/duration/timing/severity/associated sxs/prior treatment) HPI  Past Medical History  Diagnosis Date  . Hypertension   . Diabetes mellitus   . Arthritis     History reviewed. No pertinent past surgical history.  Family History  Problem Relation Age of Onset  . Diabetes Mother     History  Substance Use Topics  . Smoking status: Never Smoker   . Smokeless tobacco: Not on file  . Alcohol Use: No     ocassionally    OB History    Grav Para Term Preterm Abortions TAB SAB Ect Mult Living                  Review of Systems  Allergies  Review of patient's allergies indicates no known allergies.  Home Medications   Current Outpatient Rx  Name Route Sig Dispense Refill  . GLIPIZIDE 5 MG PO TABS Oral Take 5 mg by mouth daily.    Marland Kitchen HYDROCHLOROTHIAZIDE 25 MG PO TABS Oral Take 1 tablet (25 mg total) by mouth daily. 30 tablet 0  . IBUPROFEN 200 MG PO TABS Oral Take 200 mg by mouth every 6 (six) hours as needed. For pain    . IBUPROFEN 600 MG PO TABS Oral Take 1 tablet (600 mg total) by mouth every 6 (six) hours as needed for pain. For neck pain 30 tablet 0  . LISINOPRIL 20 MG PO TABS Oral Take 0.5 tablets (10 mg total) by mouth daily. 30 tablet 0  . METFORMIN HCL 500 MG PO TABS Oral Take 1 tablet (500 mg total) by mouth 2 (two) times daily with a meal. 60 tablet 0  . NYSTATIN-TRIAMCINOLONE 100000-0.1 UNIT/GM-% EX CREA Topical Apply 1 application topically 2 (two) times daily.    Marland Kitchen OVER THE COUNTER MEDICATION Oral Take 1 tablet by mouth daily as needed. [employer] brand allergy medication For allergies      BP 149/76  Pulse 90  Temp 98.4 F (36.9 C) (Oral)  Resp 22  SpO2 97%  Physical Exam  ED Course  Procedures (including critical  care time)  Labs Reviewed  CBC WITH DIFFERENTIAL - Abnormal; Notable for the following:    Hemoglobin 11.5 (*)     HCT 34.8 (*)     All other components within normal limits  BASIC METABOLIC PANEL - Abnormal; Notable for the following:    Glucose, Bld 193 (*)     BUN 25 (*)     Creatinine, Ser 1.36 (*)     GFR calc non Af Amer 43 (*)     GFR calc Af Amer 50 (*)     All other components within normal limits  PROTIME-INR  APTT   Dg Foot Complete Left  12/20/2011  *RADIOLOGY REPORT*  Clinical Data: Persistent pain in the great toe after injury 8 months ago.  LEFT FOOT - COMPLETE 3+ VIEW  Comparison: None.  Findings: Well corticated bifid appearing medial sesamoid of the great toe noted.  Lucency along the medial distal articular surface of the first metatarsal head is most likely due to a geode, and less likely due to infection or giant cell tumor.  No underlying fracture is observed.  No malalignment at the Lisfranc joint is identified.  Plantar calcaneal spur noted.  There is mild dorsal midfoot spurring.  IMPRESSION:  1.  Plantar calcaneal spur with mild dorsal midfoot spurring. 2.  This lesion in the medial head of the first metatarsal is probably a geode.  Gouty erosion, infectious focus, or giant cell tumor are all considered less likely diagnostic considerations.   Original Report Authenticated By: Carron Curie, M.D.      1. Left leg pain   2. Claudication   3. Peripheral vascular disease    1:47 PM Handoff from Dr. Regenia Skeeter EM resident. Patient to CDU awaiting vascular surgery consult.   Vital signs reviewed and are as follows: Filed Vitals:   12/20/11 1256  BP: 149/76  Pulse: 90  Temp:   Resp: 22   Patient had L great toenail removed recently. Told to go to ED by podiatrist who could not find pedal pulses. Patient also endorses history of claudication.   1:57 PM Exam: Gen NAD; Heart RRR, nml S1,S2, no m/r/g; Lungs CTAB; Abd soft, NT, no rebound or guarding; Ext 0/4  pedal pulses bilaterally however skin is warm, no edema, L toenail absent without signs of cellulitis or infection.   Plan: Vascular surgery to see. Dispo per recommendations.   ABI: 0.49 Right, 0.46 Left.  2:45 PM Dr. Oneida Alar has seen. Will perform angiography 12/30/11.   Patient seen prior to discharge. She is aware she will have procedure 08/30. I instructed her to take an aspirin everyday.   The patient was urged to return to the Emergency Department or see her doctor urgently with worsening pain, swelling, expanding erythema especially if it streaks away from the affected area, fever, or if they have any other concerns. Patient verbalized understanding.   MDM  Claudication, non-infected wound from recent procedure on great toe. Seen by vascular surgery and arrangements made for angiography. Wound care and return instructions given. Patient to start aspirin.         Carlisle Cater, [place] 12/20/11 [phone]

## 2011-12-20 NOTE — ED Provider Notes (Signed)
I saw and evaluated the patient, reviewed the resident's note and I agree with the findings and plan.   Julianne Rice, MD 12/20/11 [phone]

## 2011-12-20 NOTE — Consult Note (Signed)
History and exam details as above.  Pt has subacute, chronic ischemia with claudication.  She may not be able to heal the left nail bed without improved flow  Ok to d/c home.  Will schedule for arteriogram 12/30/11.  Pt given our office number  She should be on one aspirin daily.  Ruta Hinds, MD [facility] Office: [phone] Pager: [phone]

## 2011-12-20 NOTE — ED Provider Notes (Signed)
History     CSN: [id number]  Arrival date & time 12/20/11  0921   First MD Initiated Contact with Patient 12/20/11 [phone]      Chief Complaint  Patient presents with  . Leg Pain    left    (Consider location/radiation/quality/duration/timing/severity/associated sxs/prior treatment) Patient is a 56 y.o. female presenting with leg pain. The history is provided by the patient.  Leg Pain  The incident occurred more than 1 week ago. The incident occurred at home. The pain is present in the left leg and left thigh. The pain is moderate. The pain has been intermittent since onset. Associated symptoms include numbness (left lateral leg). Pertinent negatives include no muscle weakness. She reports no foreign bodies present. The symptoms are aggravated by activity.    Past Medical History  Diagnosis Date  . Hypertension   . Diabetes mellitus   . Arthritis     History reviewed. No pertinent past surgical history.  Family History  Problem Relation Age of Onset  . Diabetes Mother     History  Substance Use Topics  . Smoking status: Never Smoker   . Smokeless tobacco: Not on file  . Alcohol Use: No     ocassionally    OB History    Grav Para Term Preterm Abortions TAB SAB Ect Mult Living                  Review of Systems  Constitutional: Negative for fever and chills.  HENT: Negative.   Eyes: Negative.   Respiratory: Negative for shortness of breath.   Cardiovascular: Negative for chest pain and leg swelling.  Gastrointestinal: Negative for nausea, vomiting and abdominal pain.  Genitourinary: Negative for urgency, decreased urine volume and difficulty urinating.  Musculoskeletal: Positive for back pain (chronic).       Left leg pain when walking after about 5 min  Skin: Positive for wound (to left great toe).  Neurological: Positive for numbness (left lateral leg). Negative for weakness.  All other systems reviewed and are negative.    Allergies  Review of patient's  allergies indicates no known allergies.  Home Medications   Current Outpatient Rx  Name Route Sig Dispense Refill  . GLIPIZIDE 5 MG PO TABS Oral Take 5 mg by mouth daily.    Marland Kitchen HYDROCHLOROTHIAZIDE 25 MG PO TABS Oral Take 1 tablet (25 mg total) by mouth daily. 30 tablet 0  . IBUPROFEN 600 MG PO TABS Oral Take 1 tablet (600 mg total) by mouth every 6 (six) hours as needed for pain. For neck pain 30 tablet 0  . LISINOPRIL 20 MG PO TABS Oral Take 0.5 tablets (10 mg total) by mouth daily. 30 tablet 0  . METFORMIN HCL 500 MG PO TABS Oral Take 1 tablet (500 mg total) by mouth 2 (two) times daily with a meal. 60 tablet 0    BP 132/71  Pulse 96  Temp 98.4 F (36.9 C) (Oral)  Resp 16  SpO2 100%  Physical Exam  Constitutional: She is oriented to person, place, and time. She appears well-developed and well-nourished. No distress.  HENT:  Head: Normocephalic and atraumatic.  Right Ear: External ear normal.  Left Ear: External ear normal.  Nose: Nose normal.  Mouth/Throat: Oropharynx is clear and moist.  Neck: Neck supple.  Cardiovascular: Normal rate, regular rhythm, normal heart sounds and intact distal pulses.   Pulmonary/Chest: Effort normal and breath sounds normal. No respiratory distress. She has no wheezes. She has no rales.  Abdominal: Soft. She exhibits no distension. There is no tenderness.  Musculoskeletal: She exhibits no edema.       Feet:       Unable to doppler pulses in DP/PT in left foot, as well as popliteal. Normal femoral pulse. DP/PT pulses dopplerable in right foot. Foot is warm with normal motor and sensation  Neurological: She is alert and oriented to person, place, and time.  Skin: Skin is warm and dry. She is not diaphoretic.    ED Course  Procedures (including critical care time)  Labs Reviewed  CBC WITH DIFFERENTIAL - Abnormal; Notable for the following:    Hemoglobin 11.5 (*)     HCT 34.8 (*)     All other components within normal limits  BASIC METABOLIC  PANEL - Abnormal; Notable for the following:    Glucose, Bld 193 (*)     BUN 25 (*)     Creatinine, Ser 1.36 (*)     GFR calc non Af Amer 43 (*)     GFR calc Af Amer 50 (*)     All other components within normal limits  PROTIME-INR  APTT   Dg Foot Complete Left  12/20/2011  *RADIOLOGY REPORT*  Clinical Data: Persistent pain in the great toe after injury 8 months ago.  LEFT FOOT - COMPLETE 3+ VIEW  Comparison: None.  Findings: Well corticated bifid appearing medial sesamoid of the great toe noted.  Lucency along the medial distal articular surface of the first metatarsal head is most likely due to a geode, and less likely due to infection or giant cell tumor.  No underlying fracture is observed.  No malalignment at the Lisfranc joint is identified.  Plantar calcaneal spur noted.  There is mild dorsal midfoot spurring.  IMPRESSION:  1.  Plantar calcaneal spur with mild dorsal midfoot spurring. 2.  This lesion in the medial head of the first metatarsal is probably a geode.  Gouty erosion, infectious focus, or giant cell tumor are all considered less likely diagnostic considerations.   Original Report Authenticated By: Carron Curie, M.D.    VASCULAR LAB  PRELIMINARY  ARTERIAL  ABI completed: Bilateral ABIs in the severe loss of flow category. Waveforms on the left worse than on the right.   RIGHT    LEFT     PRESSURE  WAVEFORM   PRESSURE  WAVEFORM   BRACHIAL  164  tri  BRACHIAL  152  tri   DP    DP     AT  80  mono  AT  72  Dampened monophasic   PT  70  mono  PT  76  Dampened monophasic   PER    PER     GREAT TOE   NA  GREAT TOE   NA     RIGHT  LEFT   ABI  0.49 severe  0.46 severe      1. Left leg pain   2. Claudication   3. Peripheral vascular disease       MDM  56 yo female with DM, HTN with intermittent left lateral leg and calf pain for several weeks, only with exertion after approx 5 min. Also feels numbness/tingling. Saw podiatrist yesterday for chronic left great toe  pain/injury s/p getting foot run over 1 year ago and had part of her toenail removed and encouraged to come to ED for concern for decreased blood flow. Unable to doppler pulses in left foot but patient's foot is warm, dry. Ultrasound shows  decreased pulses (severely) but with intact flow. Severe ABIs as above. Discussed over the phone with Dr. Oneida Alar who is in [place]. They will see patient, but will be a while. Will move to CDU to await vascular disposition        Sherwood Gambler, MD 12/20/11 1304

## 2011-12-20 NOTE — ED Notes (Signed)
PT has ulcer on left foot and now has started having pain in left outer thigh with ambulating only 5 minutes.  Sent here for vascular study because doctor could not feel a good pulse.  Foot is warm and feel faint PT pulse

## 2011-12-20 NOTE — ED Notes (Signed)
Pt changed into gown.
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
## 2011-12-20 NOTE — ED Provider Notes (Signed)
Medical screening examination/treatment/procedure(s) were conducted as a shared visit with non-physician practitioner(s) and myself.  I personally evaluated the patient during the encounter   Julianne Rice, MD 12/20/11 [phone]

## 2011-12-20 NOTE — Progress Notes (Signed)
VASCULAR LAB PRELIMINARY  ARTERIAL  ABI completed:  Bilateral ABIs in the severe loss of flow category.  Waveforms on the left worse than on the right.    RIGHT    LEFT    PRESSURE WAVEFORM  PRESSURE WAVEFORM  BRACHIAL 164 tri BRACHIAL 152 tri  DP   DP    AT 80 mono AT 72 Dampened monophasic   PT 70 mono PT 76 Dampened monophasic   PER   PER    GREAT TOE  NA GREAT TOE  NA    RIGHT LEFT  ABI 0.49 severe  0.46 severe     [name], RVT 12/20/2011, 11:56 AM

## 2011-12-21 ENCOUNTER — Encounter (HOSPITAL_COMMUNITY): Payer: Self-pay | Admitting: Pharmacy Technician

## 2011-12-21 ENCOUNTER — Encounter: Payer: Self-pay | Admitting: *Deleted

## 2011-12-21 ENCOUNTER — Other Ambulatory Visit: Payer: Self-pay | Admitting: *Deleted

## 2011-12-29 ENCOUNTER — Ambulatory Visit (HOSPITAL_COMMUNITY): Admission: RE | Admit: 2011-12-29 | Payer: Self-pay | Source: Ambulatory Visit | Admitting: Vascular Surgery

## 2011-12-29 ENCOUNTER — Encounter (HOSPITAL_COMMUNITY)
Admission: RE | Disposition: A | Discharge status: Home / Self Care | Payer: Self-pay | Source: Ambulatory Visit | Attending: Vascular Surgery

## 2011-12-29 ENCOUNTER — Ambulatory Visit (HOSPITAL_COMMUNITY)
Admission: RE | Admit: 2011-12-29 | Discharge: 2011-12-29 | Disposition: A | Discharge status: Home / Self Care | Payer: Medicaid Other | Source: Ambulatory Visit | Attending: Vascular Surgery | Admitting: Vascular Surgery

## 2011-12-29 ENCOUNTER — Encounter (HOSPITAL_COMMUNITY): Admission: RE | Payer: Self-pay | Source: Ambulatory Visit

## 2011-12-29 DIAGNOSIS — E119 Type 2 diabetes mellitus without complications: Secondary | ICD-10-CM | POA: Insufficient documentation

## 2011-12-29 DIAGNOSIS — I70219 Atherosclerosis of native arteries of extremities with intermittent claudication, unspecified extremity: Secondary | ICD-10-CM | POA: Insufficient documentation

## 2011-12-29 DIAGNOSIS — I10 Essential (primary) hypertension: Secondary | ICD-10-CM | POA: Insufficient documentation

## 2011-12-29 HISTORY — PX: ABDOMINAL ANGIOGRAM: SHX5499

## 2011-12-29 LAB — GLUCOSE, CAPILLARY: Glucose-Capillary: 126 mg/dL — ABNORMAL HIGH (ref 70–99)

## 2011-12-29 LAB — POCT ACTIVATED CLOTTING TIME
Activated Clotting Time: 214 seconds
Activated Clotting Time: 219 seconds

## 2011-12-29 SURGERY — ABDOMINAL ANGIOGRAM
Anesthesia: LOCAL

## 2011-12-29 SURGERY — ABDOMINAL AORTAGRAM
Anesthesia: LOCAL

## 2011-12-29 MED ORDER — CLOPIDOGREL BISULFATE 75 MG PO TABS: 75.0000 mg | ORAL_TABLET | Freq: Every day | ORAL | Status: DC

## 2011-12-29 MED ORDER — SODIUM CHLORIDE 0.9 % IV SOLN
INTRAVENOUS | Status: DC
  Administered 2011-12-29: 09:00:00 via INTRAVENOUS

## 2011-12-29 MED ORDER — CLOPIDOGREL BISULFATE 300 MG PO TABS
ORAL_TABLET | ORAL | Status: AC
  Filled 2011-12-29: qty 1

## 2011-12-29 MED ORDER — HEPARIN SODIUM (PORCINE) 1000 UNIT/ML IJ SOLN
INTRAMUSCULAR | Status: AC
  Filled 2011-12-29: qty 1

## 2011-12-29 MED ORDER — MORPHINE SULFATE 4 MG/ML IJ SOLN: 2.0000 mg | INTRAMUSCULAR | Status: DC | PRN

## 2011-12-29 MED ORDER — ACETAMINOPHEN 325 MG PO TABS: 650.0000 mg | ORAL_TABLET | ORAL | Status: DC | PRN

## 2011-12-29 MED ORDER — CLOPIDOGREL BISULFATE 75 MG PO TABS
300.0000 mg | ORAL_TABLET | Freq: Once | ORAL | Status: AC
  Administered 2011-12-29: 300 mg via ORAL

## 2011-12-29 MED ORDER — HYDRALAZINE HCL 20 MG/ML IJ SOLN
INTRAMUSCULAR | Status: AC
  Filled 2011-12-29: qty 1

## 2011-12-29 MED ORDER — SODIUM CHLORIDE 0.9 % IV SOLN: 1.0000 mL/kg/h | INTRAVENOUS | Status: DC

## 2011-12-29 MED ORDER — ONDANSETRON HCL 4 MG/2ML IJ SOLN: 4.0000 mg | Freq: Four times a day (QID) | INTRAMUSCULAR | Status: DC | PRN

## 2011-12-29 MED ORDER — HYDRALAZINE HCL 20 MG/ML IJ SOLN
10.0000 mg | Freq: Once | INTRAMUSCULAR | Status: AC
  Administered 2011-12-29: 10 mg via INTRAVENOUS

## 2011-12-29 MED ORDER — MIDAZOLAM HCL 2 MG/2ML IJ SOLN
INTRAMUSCULAR | Status: AC
  Filled 2011-12-29: qty 2

## 2011-12-29 MED ORDER — FENTANYL CITRATE 0.05 MG/ML IJ SOLN
INTRAMUSCULAR | Status: AC
  Filled 2011-12-29: qty 2

## 2011-12-29 MED ORDER — HEPARIN (PORCINE) IN NACL 2-0.9 UNIT/ML-% IJ SOLN
INTRAMUSCULAR | Status: AC
  Filled 2011-12-29: qty 1000

## 2011-12-29 MED ORDER — LABETALOL HCL 5 MG/ML IV SOLN
10.0000 mg | Freq: Once | INTRAVENOUS | Status: AC
  Administered 2011-12-29: 10 mg via INTRAVENOUS

## 2011-12-29 MED ORDER — LIDOCAINE HCL (PF) 1 % IJ SOLN
INTRAMUSCULAR | Status: AC
  Filled 2011-12-29: qty 30

## 2011-12-29 MED ORDER — HYDRALAZINE HCL 20 MG/ML IJ SOLN
10.0000 mg | INTRAMUSCULAR | Status: DC | PRN
  Administered 2011-12-29 (×2): 10 mg via INTRAVENOUS
  Filled 2011-12-29: qty 0.5

## 2011-12-29 MED ORDER — LABETALOL HCL 5 MG/ML IV SOLN
INTRAVENOUS | Status: AC
  Filled 2011-12-29: qty 4

## 2011-12-29 MED ORDER — OXYCODONE-ACETAMINOPHEN 5-325 MG PO TABS
1.0000 | ORAL_TABLET | ORAL | Status: DC | PRN
  Administered 2011-12-29: 2 via ORAL
  Filled 2011-12-29: qty 2

## 2011-12-29 NOTE — Interval H&P Note (Signed)
[facility] [place]  History and Physical Update  The patient was interviewed and re-examined.  The patient's previous History and Physical has been reviewed and is unchanged from Dr. Nona Dell consult on: 12/20/11.  There is no change in the plan of care: Aortogram, Left leg runoff.  Adele Barthel, MD [facility] [place] Office: [phone] Pager: [phone]  12/29/2011, 9:12 AM

## 2011-12-29 NOTE — Op Note (Signed)
OPERATIVE NOTE   PROCEDURE: 1.  Right common femoral artery cannulation under ultrasound guidance 2.  Aortogram 3.  Bilateral leg runoff 4.  Subintimal angioplasty of left femoropopliteal artery (3 mm x 80 mm)  PRE-OPERATIVE DIAGNOSIS: Bilateral leg claudication  POST-OPERATIVE DIAGNOSIS: same as above   SURGEON: Adele Barthel, MD  ANESTHESIA: conscious sedation  ESTIMATED BLOOD LOSS: 50 cc  CONTRAST: 130 cc  FINDING(S):  Aorta: patent  Superior mesenteric artery: patent Celiac artery: patent Inferior mesenteric artery: patent   Right Left  RA patent patent  CIA patent patent  EIA patent patent  IIA patent patent  CFA patent patent  SFA patent, >50% stenosis patent  PFA patent but severely diseased patent but severely diseased  Pop patent Occluded in above-the-knee segment: recannulated after subintimal angioplasty, no flow limiting dissection  Trif patent Patent but 75% stenosis  AT Diseased and diminutive Diseased and diminutive, dominant runoff  Pero Diseased and diminutive Diseased and diminutive  PT Diseased and diminutive Diseased and diminutive   Both feet with faint diminutive three vessel runoff  SPECIMEN(S):  none  INDICATIONS:   [name] is a 56 y.o. female who presents with bilateral leg claudicaiton: left > right.  The patient presents for: aortogram, bilateral leg runoff, and possible intervention.  I discussed with the patient the nature of angiographic procedures, especially the limited patencies of any endovascular intervention.  The patient is aware of that the risks of an angiographic procedure include but are not limited to: bleeding, infection, access site complications, renal failure, embolization, rupture of vessel, dissection, possible need for emergent surgical intervention, possible need for surgical procedures to treat the patient's pathology, and stroke and death.  The patient is aware of the risks and agrees to  proceed.  DESCRIPTION: After full informed consent was obtained from the patient, the patient was brought back to the angiography suite.  The patient was placed supine upon the angiography table and connected to monitoring equipment.  The patient was then given conscious sedation, the amounts of which are documented in the patient's chart.  The patient was prepped and drape in the standard fashion for an angiographic procedure.  At this point, attention was turned to the right groin.  Under ultrasound guidance, the right common femoral artery will be cannulated with a 18 gauge needle.  The [facility] wire was passed up into the aorta.  The needle was exchanged for a 5-Fr sheath, which was advanced over the wire into the common femoral artery.  The dilator was then removed.  The Omniflush catheter was then loaded over the wire up to the level of L1.  The catheter was connected to the power injector circuit.  After de-airring and de-clotting the circuit, a power injector aortogram was completed.  The findings are listed above.  I pulled the catheter down to just proximal to the aortic bifurcation.  An automated bilateral leg runoff was completed.  The imaging of the left leg was somewhat degraded by pull down of the catheter into the right proximal common iliac artery.  The [facility] wire was replaced in the catheter, and using the [facility] and Crossover catheter, the left common iliac artery was selected.  The catheter and wire were advanced into the external iliac artery.  The catheter was exchanged for an end hole catheter.  A power injection was completed to reimage the above-the-knee popliteal artery.  Based on the images, I felt an attempt at recannulation of the left popliteal artery was indicated.  I loaded a Rosen wire down into the left superficial femoral artery.  The patient's right femoral sheath was exchanged for a 6-Fr Destination sheath, which was lodged in the left superficial femoral artery.  The dilator  was removed. I loaded a Navicross catheter over the wire and then the wire was exchanged for a Glidewire.  Using these two, I was able to traverse the occluded popliteal segment in the subintimal plane and re-enter into the at the knee segment of the popliteal artery.  Hand injections verified re-entry.  I exchanged the wire for a 0.018" Spartacore wire which was lodged into the left tibioperoneal trunk.  I obtained a 3 mm x 80 mm angioplasty balloon and balloon the entire occluded segment, also overlapping the entry and exit sites.  I did a completion angiogram which demonstrated a stenosis in the proximal portion of this artery.  I re-angioplastied this segment with the same 3 mm x 80 mm balloon.  The completion angiogram demonstrated <30% residual stenosis with no non-flow limiting dissection in this newly recannulated popliteal artery.  I also did another injection to demonstrate no embolization to the tibial arteries.  At this point, the balloon and wire were removed.  I pulled the sheath back into the right external iliac artery.  The sheath was aspirated.  No clots were present and the sheath was reloaded with heparinized saline.   Once [name] anticoagulation reverses, the right sheath will be removed in the holding area.    Unfortunately, she has severe tibial disease bilaterally, and I don't think any endovascular or surgical intervention on these tibial arteries will be safe.  Hopefully, the extra blood flow to the tibial arteries on the left will improve [name] chances of rehealing [name] nail bed in this leg.  COMPLICATIONS: none  CONDITION: stable   Adele Barthel, MD [facility] [place] Office: [phone] Pager: [phone]  12/29/2011, 12:08 PM
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
## 2011-12-29 NOTE — Progress Notes (Signed)
DR DICKSON NOTIFIED OF B/P AND HEART RATE AND ORDER NOTED

## 2011-12-30 ENCOUNTER — Telehealth: Payer: Self-pay | Admitting: Vascular Surgery

## 2011-12-30 LAB — POCT ACTIVATED CLOTTING TIME
Activated Clotting Time: 179 seconds
Activated Clotting Time: 194 seconds

## 2011-12-30 NOTE — Telephone Encounter (Addendum)
Message copied by Doristine Section on Fri Dec 30, 2011 10:17 AM ------      Message from: Alfonso Patten      Created: Fri Dec 30, 2011  9:50 AM                   ----- Message -----         From: Conrad Pojoaque, MD         Sent: 12/29/2011  12:28 PM           To: Patrici Ranks, Alfonso Patten, RN            ANALIYAH BOULTON      [id number]      18-Apr-1956            PROCEDURE:      1.  Right common femoral artery cannulation under ultrasound guidance      2.  Aortogram      3.  Bilateral leg runoff      4.  Subintimal angioplasty of left femoropopliteal artery (3 mm x 80 mm)            Follow-up: Dr. Oneida Alar in 2-4 weeks  notified pt. of fu appt. with dr. Oneida Alar 01/19/12 at 12:45

## 2012-01-03 ENCOUNTER — Telehealth: Payer: Self-pay

## 2012-01-03 NOTE — Telephone Encounter (Signed)
Phone call from pt.  Reports onset of a fine rash on back, thighs, and neck on Sunday 01/01/12; reports new medication, Clopidogrel 75 mg. Stated this is the only change in her medication.  States her left foot is swollen today, but denies pain in foot with weight bearing.  Advised pt. to hold the Clopidogrel at this time, and to take Benadryl 25 mg. per package directions, for drug reaction.  Will make Dr. Oneida Alar aware of symptoms.  Advised pt. she is to take Aspirin daily, according to the consult note by Dr. Oneida Alar, from hospital.  Stated she did not receive those instructions when discharged. Questioned about the strength of Aspirin?  Advised pt. will discuss symptoms and recommendation on ASA strengh w/ Dr. Oneida Alar, and call her back.

## 2012-01-04 ENCOUNTER — Telehealth: Payer: Self-pay

## 2012-01-04 NOTE — Telephone Encounter (Signed)
Message copied by Denman George on Wed Jan 04, 2012  9:36 AM ------      Message from: Conrad Falman      Created: Tue Jan 03, 2012  5:50 PM      Regarding: RE: rash after taking Clopidogrel       ASA 81 mg QD            ----- Message -----         From: Sherrye Payor, RN         Sent: 01/03/2012  12:49 PM           To: Conrad [place], MD      Subject: rash after taking Clopidogrel                            Dr. Bridgett Larsson-  This message was sent to [facility].  He responded to check with you re: the Clopidogrel.  Do you feel she should continue to take this, in light of the rash?  Also, Dr. Oneida Alar didn't answer question of ASA strength.  Can you advise?/ Kerry Dory, RN  01/03/2012 10:40 AM  Signed      Phone call from pt.  Reports onset of a fine rash on back, thighs, and neck on Sunday 01/01/12; reports new medication, Clopidogrel 75 mg. Stated this is the only change in her medication.  States her left foot is swollen today, but denies pain in foot with weight bearing.  Advised pt. to hold the Clopidogrel at this time, and to take Benadryl 25 mg. per package directions, for drug reaction.  Will make Dr. Oneida Alar aware of symptoms.  Advised pt. she is to take Aspirin daily, according to the consult note by Dr. Oneida Alar, from hospital.  Stated she did not receive those instructions when discharged. Questioned about the strength of Aspirin?  Advised pt. will discuss symptoms and recommendation on ASA strengh w/ Dr. Oneida Alar, and call her back

## 2012-01-04 NOTE — Telephone Encounter (Signed)
Rec'd order clarification from Dr. Bridgett Larsson for ASA 81 mg qd.  Phone call to pt.  Informed of recommendation for ASA of 81 mg daily.  Verbalized understanding.  Pt. stated she is having difficulty walking.  C/o pain in left hip and foot.  States left foot still swollen.  Denies redness or inflammation of left foot.  Denies fever or chills.  States foot feels warm compared to being cool prior to procedure.  States pain in left foot is in the "instep and great toe area"  Denies any drainage from great toe.  Pt. Has f/u appt. 9/19.  Advised will try to move appt. Up to 01/12/12.  Advised to elevate left foot/leg above level of heart several times/day for the swelling.  Advised to call office or go to ER if symptoms worsen/ adv. to watch for increased pain, swelling, inflammation, or open sores.  Verb. Understanding.

## 2012-01-11 ENCOUNTER — Encounter: Payer: Self-pay | Admitting: Vascular Surgery

## 2012-01-12 ENCOUNTER — Ambulatory Visit (INDEPENDENT_AMBULATORY_CARE_PROVIDER_SITE_OTHER): Payer: Self-pay | Admitting: Vascular Surgery

## 2012-01-12 ENCOUNTER — Encounter: Payer: Self-pay | Admitting: Vascular Surgery

## 2012-01-12 VITALS — BP 158/82 | HR 96 | Resp 16 | Ht 63.0 in | Wt 168.0 lb

## 2012-01-12 DIAGNOSIS — L98499 Non-pressure chronic ulcer of skin of other sites with unspecified severity: Secondary | ICD-10-CM

## 2012-01-12 DIAGNOSIS — I739 Peripheral vascular disease, unspecified: Secondary | ICD-10-CM

## 2012-01-12 DIAGNOSIS — Z48812 Encounter for surgical aftercare following surgery on the circulatory system: Secondary | ICD-10-CM

## 2012-01-12 NOTE — Addendum Note (Signed)
Addended by: Mena Goes on: 01/12/2012 03:09 PM   Modules accepted: Orders

## 2012-01-12 NOTE — Progress Notes (Signed)
Patient is a 56 year old female who recently underwent angioplasty subintimal of her left superficial femoral artery. She was noted on arteriogram to have diffuse unreconstructable tibial disease. Her procedure was done for a nonhealing wound of her left first toe. She states the pain in her left first toe is much better. Chronic medical problems include hypertension diabetes. These are currently stable. However, she currently does not have a primary care physician.  Past Medical History  Diagnosis Date  . Hypertension   . Diabetes mellitus   . Arthritis    Review of systems: She denies shortness of breath. She denies chest pain.  Physical exam:  Blood pressure 158/82, pulse 96, resp. rate 16, height 5\' 3"  (1.6 m), weight 168 lb (76.204 kg), SpO2 100.00%.  Left lower extremity: 2+ left femoral pulse absent pedal pulses left first toe nail bed has dry gangrene which is significantly improved and seems to be healing  Assessment: Healing wound left lower extremity   Plan:  Repeat arterial duplex and ABIs in 6 months. The patient was given the phone number for the [facility] to establish herself a primary care physician.  He was emphasized to the patient that she needs a long-term primary care physician to manage her diabetes.  Ruta Hinds, MD [facility] Office: [phone] Pager: [phone]

## 2012-01-19 ENCOUNTER — Ambulatory Visit: Payer: Self-pay | Admitting: Vascular Surgery

## 2012-07-12 ENCOUNTER — Ambulatory Visit: Payer: Self-pay | Admitting: Vascular Surgery

## 2012-07-19 ENCOUNTER — Ambulatory Visit: Payer: Self-pay | Admitting: Vascular Surgery

## 2012-08-15 ENCOUNTER — Encounter: Payer: Self-pay | Admitting: Vascular Surgery

## 2012-08-16 ENCOUNTER — Encounter (INDEPENDENT_AMBULATORY_CARE_PROVIDER_SITE_OTHER): Payer: Self-pay | Admitting: *Deleted

## 2012-08-16 ENCOUNTER — Encounter: Payer: Self-pay | Admitting: Vascular Surgery

## 2012-08-16 ENCOUNTER — Ambulatory Visit (INDEPENDENT_AMBULATORY_CARE_PROVIDER_SITE_OTHER): Payer: Self-pay | Admitting: Vascular Surgery

## 2012-08-16 ENCOUNTER — Other Ambulatory Visit: Payer: Self-pay | Admitting: *Deleted

## 2012-08-16 VITALS — BP 157/87 | HR 88 | Ht 63.0 in | Wt 173.9 lb

## 2012-08-16 DIAGNOSIS — L98499 Non-pressure chronic ulcer of skin of other sites with unspecified severity: Secondary | ICD-10-CM

## 2012-08-16 DIAGNOSIS — Z48812 Encounter for surgical aftercare following surgery on the circulatory system: Secondary | ICD-10-CM

## 2012-08-16 DIAGNOSIS — I739 Peripheral vascular disease, unspecified: Secondary | ICD-10-CM

## 2012-08-16 DIAGNOSIS — I7025 Atherosclerosis of native arteries of other extremities with ulceration: Secondary | ICD-10-CM | POA: Insufficient documentation

## 2012-08-16 NOTE — Progress Notes (Signed)
Patient is status post left superficial femoral artery angioplasty, partner Dr. Bridgett Larsson September 2013. This was done for nonhealing ulcer left first toe. The ulcer has now healed. She has noticed that the toe nail of her right third toe has pulled loose but so far this seems to be healing. She had been given the recommendation of a followed by primary care physician at her last office visit. Unfortunately she has not scheduled his office visit.  Chronic medical problems include hypertension diabetes and arthritis. She is able to ambulate some get some leg swelling she is on her legs for long period of time. She denies claudication or rest pain.  Past Medical History  Diagnosis Date  . Hypertension   . Diabetes mellitus   . Arthritis    History reviewed. No pertinent past surgical history.  Review of systems: She denies shortness of breath. She denies chest pain.  Physical exam: Filed Vitals:   08/16/12 1425  BP: 157/87  Pulse: 88  Height: 5\' 3"  (1.6 m)  Weight: 173 lb 14.4 oz (78.881 kg)  SpO2: 99%   Vascular: She has 2+ femoral pulses bilaterally, absent popliteal and pedal pulses. Skin: Ulceration left first toe with small area of callus but essentially healed, right third toe nail loose but no obvious ulcer beneath this  Data: She had an arterial duplex exam performed of the left leg today with bilateral ABIs. ABI on the right was 0.71 left was 0.87 the left superficial femoral artery was patent with left than 50% stenosis  Assessment: Peripheral arterial disease with patent left superficial femoral artery angioplasty and healing wound  Plan: Repeat duplex and ABIs in 3 months. Again encouraged patient to seek a primary care physician for followup of her chronic medical problems. She was given a number for [employer] today.  Ruta Hinds, MD [facility] Office: [phone] Pager: [phone]

## 2012-10-30 ENCOUNTER — Emergency Department (HOSPITAL_COMMUNITY)
Admission: EM | Admit: 2012-10-30 | Discharge: 2012-10-30 | Disposition: A | Discharge status: Home / Self Care | Payer: Self-pay | Attending: Emergency Medicine | Admitting: Emergency Medicine

## 2012-10-30 ENCOUNTER — Encounter (HOSPITAL_COMMUNITY): Payer: Self-pay | Admitting: Emergency Medicine

## 2012-10-30 ENCOUNTER — Emergency Department (HOSPITAL_COMMUNITY): Payer: Self-pay

## 2012-10-30 DIAGNOSIS — R799 Abnormal finding of blood chemistry, unspecified: Secondary | ICD-10-CM | POA: Insufficient documentation

## 2012-10-30 DIAGNOSIS — Z8739 Personal history of other diseases of the musculoskeletal system and connective tissue: Secondary | ICD-10-CM | POA: Insufficient documentation

## 2012-10-30 DIAGNOSIS — Z79899 Other long term (current) drug therapy: Secondary | ICD-10-CM | POA: Insufficient documentation

## 2012-10-30 DIAGNOSIS — R0609 Other forms of dyspnea: Secondary | ICD-10-CM | POA: Insufficient documentation

## 2012-10-30 DIAGNOSIS — R0600 Dyspnea, unspecified: Secondary | ICD-10-CM

## 2012-10-30 DIAGNOSIS — E119 Type 2 diabetes mellitus without complications: Secondary | ICD-10-CM | POA: Insufficient documentation

## 2012-10-30 DIAGNOSIS — Z8679 Personal history of other diseases of the circulatory system: Secondary | ICD-10-CM | POA: Insufficient documentation

## 2012-10-30 DIAGNOSIS — I10 Essential (primary) hypertension: Secondary | ICD-10-CM | POA: Insufficient documentation

## 2012-10-30 DIAGNOSIS — R0989 Other specified symptoms and signs involving the circulatory and respiratory systems: Secondary | ICD-10-CM | POA: Insufficient documentation

## 2012-10-30 DIAGNOSIS — R7989 Other specified abnormal findings of blood chemistry: Secondary | ICD-10-CM

## 2012-10-30 DIAGNOSIS — Z7982 Long term (current) use of aspirin: Secondary | ICD-10-CM | POA: Insufficient documentation

## 2012-10-30 HISTORY — DX: Peripheral vascular disease, unspecified: I73.9

## 2012-10-30 LAB — CBC WITH DIFFERENTIAL/PLATELET
Basophils Absolute: 0 10*3/uL (ref 0.0–0.1)
Basophils Relative: 1 % (ref 0–1)
Eosinophils Absolute: 0.1 10*3/uL (ref 0.0–0.7)
HCT: 29.9 % — ABNORMAL LOW (ref 36.0–46.0)
MCH: 28.7 pg (ref 26.0–34.0)
MCHC: 32.8 g/dL (ref 30.0–36.0)
Monocytes Absolute: 0.3 10*3/uL (ref 0.1–1.0)
Neutro Abs: 3.3 10*3/uL (ref 1.7–7.7)
RDW: 15.4 % (ref 11.5–15.5)

## 2012-10-30 LAB — BASIC METABOLIC PANEL
Calcium: 9.2 mg/dL (ref 8.4–10.5)
Chloride: 106 mEq/L (ref 96–112)
Creatinine, Ser: 1.13 mg/dL — ABNORMAL HIGH (ref 0.50–1.10)
GFR calc Af Amer: 62 mL/min — ABNORMAL LOW (ref >90)
GFR calc non Af Amer: 54 mL/min — ABNORMAL LOW (ref >90)

## 2012-10-30 LAB — PRO B NATRIURETIC PEPTIDE: Pro B Natriuretic peptide (BNP): 2655 pg/mL — ABNORMAL HIGH (ref 0–125)

## 2012-10-30 MED ORDER — SPIRONOLACTONE 25 MG PO TABS: 25.0000 mg | ORAL_TABLET | Freq: Every day | ORAL | Status: DC

## 2012-10-30 NOTE — ED Provider Notes (Signed)
History    CSN: [id number] Arrival date & time 10/30/12  1000  First MD Initiated Contact with Patient 10/30/12 1014     Chief Complaint  Patient presents with  . Shortness of Breath   (Consider location/radiation/quality/duration/timing/severity/associated sxs/prior Treatment) Patient is a 57 y.o. female presenting with shortness of breath. The history is provided by the patient. No language interpreter was used.  Shortness of Breath Severity:  Mild Associated symptoms: no abdominal pain, no chest pain, no cough, no fever, no headaches and no vomiting   Associated symptoms comment:  For the past 3 days she has had shortness of breath without cough or fever. It is worse with exertion and with lying flat, although she denies needing/using additional pillows while supine. No swelling of lower extremities. She denies chest pain.  Past Medical History  Diagnosis Date  . Hypertension   . Diabetes mellitus   . Arthritis   . PVD (peripheral vascular disease)    History reviewed. No pertinent past surgical history. Family History  Problem Relation Age of Onset  . Diabetes Mother    History  Substance Use Topics  . Smoking status: Never Smoker   . Smokeless tobacco: Never Used  . Alcohol Use: Yes     Comment: ocassionally   OB History   Grav Para Term Preterm Abortions TAB SAB Ect Mult Living                 Review of Systems  Constitutional: Negative for fever and chills.  HENT: Negative.   Respiratory: Positive for shortness of breath. Negative for cough.   Cardiovascular: Negative.  Negative for chest pain, palpitations and leg swelling.  Gastrointestinal: Negative.  Negative for vomiting and abdominal pain.  Genitourinary: Negative for dysuria.  Musculoskeletal: Negative.   Skin: Negative.   Neurological: Negative.  Negative for weakness and headaches.    Allergies  Review of patient's allergies indicates no known allergies.  Home Medications   Current  Outpatient Rx  Name  Route  Sig  Dispense  Refill  . aspirin 325 MG tablet   Oral   Take 325 mg by mouth daily.         Marland Kitchen glipiZIDE (GLUCOTROL) 5 MG tablet   Oral   Take 5 mg by mouth daily.         . hydrochlorothiazide (HYDRODIURIL) 25 MG tablet   Oral   Take 25 mg by mouth daily.         Marland Kitchen ibuprofen (ADVIL,MOTRIN) 200 MG tablet   Oral   Take 200-600 mg by mouth every 6 (six) hours as needed. For pain         . lisinopril (PRINIVIL,ZESTRIL) 10 MG tablet   Oral   Take 10 mg by mouth daily.         . metFORMIN (GLUCOPHAGE) 500 MG tablet   Oral   Take 1,000 mg by mouth 2 (two) times daily with a meal.          . Ophthalmic Irrigation Solution (EYE [place] OP)   Both Eyes   Place 1 Applicatorful into both eyes as needed (to wash eyes out).         Marland Kitchen OVER THE COUNTER MEDICATION   Oral   Take 1 tablet by mouth daily as needed. [employer] brand allergy medication For allergies          BP 180/93  Pulse 93  Temp(Src) 98.3 F (36.8 C) (Oral)  Resp 24  SpO2 100%  Physical Exam  Constitutional: She is oriented to person, place, and time. She appears well-developed and well-nourished.  HENT:  Head: Normocephalic.  Neck: Normal range of motion. Neck supple.  Cardiovascular: Normal rate and regular rhythm.   Pulmonary/Chest: Effort normal and breath sounds normal.  Abdominal: Soft. Bowel sounds are normal. There is no tenderness. There is no rebound and no guarding.  Musculoskeletal: Normal range of motion.  Neurological: She is alert and oriented to person, place, and time.  Skin: Skin is warm and dry. No rash noted.  Psychiatric: She has a normal mood and affect.    ED Course  Procedures (including critical care time) Labs Reviewed  CBC WITH DIFFERENTIAL  BASIC METABOLIC PANEL  PRO B NATRIURETIC PEPTIDE  TROPONIN I   Results for orders placed during the hospital encounter of 10/30/12  CBC WITH DIFFERENTIAL      Result Value Range   WBC 5.9  4.0  - 10.5 K/uL   RBC 3.41 (*) 3.87 - 5.11 MIL/uL   Hemoglobin 9.8 (*) 12.0 - 15.0 g/dL   HCT 29.9 (*) 36.0 - 46.0 %   MCV 87.7  78.0 - 100.0 fL   MCH 28.7  26.0 - 34.0 pg   MCHC 32.8  30.0 - 36.0 g/dL   RDW 15.4  11.5 - 15.5 %   Platelets 298  150 - 400 K/uL   Neutrophils Relative % 56  43 - 77 %   Neutro Abs 3.3  1.7 - 7.7 K/uL   Lymphocytes Relative 36  12 - 46 %   Lymphs Abs 2.1  0.7 - 4.0 K/uL   Monocytes Relative 6  3 - 12 %   Monocytes Absolute 0.3  0.1 - 1.0 K/uL   Eosinophils Relative 2  0 - 5 %   Eosinophils Absolute 0.1  0.0 - 0.7 K/uL   Basophils Relative 1  0 - 1 %   Basophils Absolute 0.0  0.0 - 0.1 K/uL  BASIC METABOLIC PANEL      Result Value Range   Sodium 143  135 - 145 mEq/L   Potassium 3.6  3.5 - 5.1 mEq/L   Chloride 106  96 - 112 mEq/L   CO2 27  19 - 32 mEq/L   Glucose, Bld 85  70 - 99 mg/dL   BUN 19  6 - 23 mg/dL   Creatinine, Ser 1.13 (*) 0.50 - 1.10 mg/dL   Calcium 9.2  8.4 - 10.5 mg/dL   GFR calc non Af Amer 54 (*) >90 mL/min   GFR calc Af Amer 62 (*) >90 mL/min  PRO B NATRIURETIC PEPTIDE      Result Value Range   Pro B Natriuretic peptide (BNP) 2655.0 (*) 0 - 125 pg/mL  TROPONIN I      Result Value Range   Troponin I <0.30  <0.30 ng/mL   Dg Chest 2 View  10/30/2012   *RADIOLOGY REPORT*  Clinical Data: Shortness of breath, diabetes  CHEST - 2 VIEW  Comparison: 05/10/2011  Findings: Cardiomediastinal silhouette is stable. No segmental infiltrate or pulmonary edema.  Linear atelectasis, scarring or old infiltrate in the lingula.  IMPRESSION: No segmental infiltrate or pulmonary edema.  Linear atelectasis, scarring or residual infiltrate in lingula.   Original Report Authenticated By: Lahoma Crocker, M.D.   No results found. No diagnosis found. 1. Dyspnea 2. Elevated BNP MDM  Patient has been comfortable in ED. NO pain, no complaint of pain at home. No pleuritic pain. Doubt ACS or PE.  Elevated BNP with symptoms that are typical for CHF (no history), although  with no EKG changes. Discussed with Dr. Terrence Dupont who will follow closely in officer. Recommended starting Aldactone. Stable for discharge.   Dewaine Oats, PA-C 10/30/12 1537

## 2012-10-30 NOTE — ED Notes (Signed)
Oxygen saturations remained at 95% (on room air) while ambulating on the unit

## 2012-10-30 NOTE — ED Notes (Signed)
Pt c/o increased SOB x 3 days with exertion and when laying flat; pt sts tightness around abd; pt denies leg swelling

## 2012-10-31 NOTE — ED Provider Notes (Signed)
Medical screening examination/treatment/procedure(s) were performed by non-physician practitioner and as supervising physician I was immediately available for consultation/collaboration.   Malvin Johns, MD 10/31/12 [phone]

## 2012-11-08 ENCOUNTER — Ambulatory Visit: Payer: No Typology Code available for payment source | Attending: Family Medicine | Admitting: Internal Medicine

## 2012-11-08 VITALS — BP 188/97 | HR 94 | Temp 99.0°F | Resp 18 | Ht 64.0 in | Wt 178.0 lb

## 2012-11-08 DIAGNOSIS — E119 Type 2 diabetes mellitus without complications: Secondary | ICD-10-CM

## 2012-11-08 DIAGNOSIS — I10 Essential (primary) hypertension: Secondary | ICD-10-CM

## 2012-11-08 LAB — LIPID PANEL
HDL: 45 mg/dL (ref >39)
LDL Cholesterol: 104 mg/dL — ABNORMAL HIGH (ref 0–99)
Total CHOL/HDL Ratio: 3.7 Ratio
Triglycerides: 95 mg/dL (ref <150)
VLDL: 19 mg/dL (ref 0–40)

## 2012-11-08 LAB — BASIC METABOLIC PANEL
Calcium: 9.2 mg/dL (ref 8.4–10.5)
Creat: 1.29 mg/dL — ABNORMAL HIGH (ref 0.50–1.10)
Sodium: 136 mEq/L (ref 135–145)

## 2012-11-08 LAB — HEMOGLOBIN A1C: Mean Plasma Glucose: 143 mg/dL — ABNORMAL HIGH (ref <117)

## 2012-11-08 MED ORDER — CARVEDILOL 3.125 MG PO TABS: 3.1250 mg | ORAL_TABLET | Freq: Two times a day (BID) | ORAL | Status: DC

## 2012-11-08 NOTE — Progress Notes (Signed)
Patient ID: [name], female   DOB: Jul 27, 1955, 57 y.o.   MRN: [id number]  Patient Demographics  [name], is a 57 y.o. female  CSN: [id number]  MRN: [id number]  DOB - 1955/08/18  Outpatient Primary MD for the patient is Irwin Brakeman, MD   With History of -  Past Medical History  Diagnosis Date  . Hypertension   . Diabetes mellitus   . Arthritis   . PVD (peripheral vascular disease)       No past surgical history on file.  in for   Chief Complaint  Patient presents with  . Establish Care  . Referral    dental, eye     HPI  [name]  is a 57 y.o. female, with history of hypertension, diverticulitis type II, PAD, was recently seen in the ER for shortness of breath and was found to have elevated proBNP, she was started on diuretics and referred to Dr. Terrence Dupont cardiologist who she saw today and got echogram done. She is now symptom-free here to establish care. For her peripheral arterial disease she follows with Dr. Oneida Alar. She is currently symptom-free with no acute issues.    Review of Systems    In addition to the HPI above,   No Fever-chills, No Headache, No changes with Vision or hearing, No problems swallowing food or Liquids, No Chest pain, Cough or Shortness of Breath, No Abdominal pain, No Nausea or Vommitting, Bowel movements are regular, No Blood in stool or Urine, No dysuria, No new skin rashes or bruises, No new joints pains-aches,  No new weakness, tingling, numbness in any extremity, No recent weight gain or loss, No polyuria, polydypsia or polyphagia, No significant Mental Stressors.  A full 10 point Review of Systems was done, except as stated above, all other Review of Systems were negative.   Social History History  Substance Use Topics  . Smoking status: Never Smoker   . Smokeless tobacco: Never Used  . Alcohol Use: Yes     Comment: ocassionally      Family History Family History  Problem Relation Age of Onset   . Diabetes Mother       Prior to Admission medications   Medication Sig Start Date End Date Taking? Authorizing Provider  aspirin 81 MG tablet Take 81 mg by mouth daily.   Yes Historical Provider, MD  glipiZIDE (GLUCOTROL) 5 MG tablet Take 5 mg by mouth daily.   Yes Historical Provider, MD  hydrochlorothiazide (HYDRODIURIL) 25 MG tablet Take 25 mg by mouth daily.   Yes Historical Provider, MD  ibuprofen (ADVIL,MOTRIN) 200 MG tablet Take 200-600 mg by mouth every 6 (six) hours as needed. For pain   Yes Historical Provider, MD  lisinopril (PRINIVIL,ZESTRIL) 10 MG tablet Take 10 mg by mouth daily.   Yes Historical Provider, MD  metFORMIN (GLUCOPHAGE) 500 MG tablet Take 1,000 mg by mouth 2 (two) times daily with a meal.    Yes Historical Provider, MD  spironolactone (ALDACTONE) 25 MG tablet Take 1 tablet (25 mg total) by mouth daily. 10/30/12  Yes Shari A Upstill, PA-C  carvedilol (COREG) 3.125 MG tablet Take 1 tablet (3.125 mg total) by mouth 2 (two) times daily with a meal. 11/08/12   Thurnell Lose, MD  Ophthalmic Irrigation Solution (EYE [place] OP) Place 1 Applicatorful into both eyes as needed (to wash eyes out).    Historical Provider, MD  OVER THE COUNTER MEDICATION Take 1 tablet by mouth daily as needed. [employer] brand  allergy medication For allergies    Historical Provider, MD    No Known Allergies  Physical Exam  Vitals  Blood pressure 188/97, pulse 94, temperature 99 F (37.2 C), temperature source Oral, resp. rate 18, height 5\' 4"  (1.626 m), weight 178 lb (80.74 kg), SpO2 97.00%.   1. General middle-aged African American female sitting on office examination table lying in bed in NAD,     2. Normal affect and insight, Not Suicidal or Homicidal, Awake Alert, Oriented X 3.  3. No F.N deficits, ALL C.Nerves Intact, Strength 5/5 all 4 extremities, Sensation intact all 4 extremities, Plantars down going.  4. Ears and Eyes appear Normal, Conjunctivae clear, PERRLA. Moist Oral  Mucosa. Poor dentition multiple teeth missing.  5. Supple Neck, No JVD, No cervical lymphadenopathy appriciated, No Carotid Bruits.  6. Symmetrical Chest wall movement, Good air movement bilaterally, CTAB.  7. RRR, No Gallops, Rubs or Murmurs, No Parasternal Heave.  8. Positive Bowel Sounds, Abdomen Soft, Non tender, No organomegaly appriciated,No rebound -guarding or rigidity.  9.  No Cyanosis, Normal Skin Turgor, No Skin Rash or Bruise.  10. Good muscle tone,  joints appear normal , no effusions, Normal ROM.  11. No Palpable Lymph Nodes in Neck or Axillae     Data Review  CBC No results found for this basename: WBC, HGB, HCT, PLT, MCV, MCH, MCHC, RDW, NEUTRABS, LYMPHSABS, MONOABS, EOSABS, BASOSABS, BANDABS, BANDSABD,  in the last 168 hours ------------------------------------------------------------------------------------------------------------------  Chemistries  No results found for this basename: NA, K, CL, CO2, GLUCOSE, BUN, CREATININE, GFRCGP, CALCIUM, MG, AST, ALT, ALKPHOS, BILITOT,  in the last 168 hours ------------------------------------------------------------------------------------------------------------------ estimated creatinine clearance is 57.8 ml/min (by C-G formula based on Cr of 1.13). ------------------------------------------------------------------------------------------------------------------ No results found for this basename: TSH, T4TOTAL, FREET3, T3FREE, THYROIDAB,  in the last 72 hours   Coagulation profile No results found for this basename: INR, PROTIME,  in the last 168 hours ------------------------------------------------------------------------------------------------------------------- No results found for this basename: DDIMER,  in the last 72 hours -------------------------------------------------------------------------------------------------------------------  Cardiac Enzymes No results found for this basename: CK, CKMB, TROPONINI,  MYOGLOBIN,  in the last 168 hours ------------------------------------------------------------------------------------------------------------------ No components found with this basename: POCBNP,    ---------------------------------------------------------------------------------------------------------------  Urinalysis    Component Value Date/Time   COLORURINE YELLOW 11/07/2008 1733   APPEARANCEUR CLEAR 11/07/2008 1733   LABSPEC 1.036* 11/07/2008 1733   PHURINE 5.5 11/07/2008 1733   GLUCOSEU >1000* 11/07/2008 [place] 11/07/2008 [place] 11/07/2008 1733   KETONESUR NEGATIVE 11/07/2008 1733   PROTEINUR NEGATIVE 11/07/2008 1733   UROBILINOGEN 0.2 11/07/2008 1733   NITRITE NEGATIVE 11/07/2008 1733   LEUKOCYTESUR NEGATIVE 11/07/2008 1733       Assessment and plan   Diabetes mellitus type 2. Continue home regimen, check A1c.   Hypertension. Started on Coreg. Continue home dose lisinopril, HCTZ and Aldactone.   Recent ED visit with shortness of breath and elevated proBNP much better with diuretics which will be continued, Coreg added, she is following with cardiologist Dr. Terrence Dupont had had an echogram done today we'll try to get the echo report from his office.   Poor dentition referred to dentist.     Routine health maintenance.  Screening labs. CBC noted from last visit, I have ordered BMP, TSH, A1c, needs fasting lipid panel next visit.  Colonoscopy, Mammogram, Pap smear reference made  Immunizations clinic is out of a shot it should be provided to her next visit along with fasting lipid panel next visit  Thurnell Lose M.D on 11/08/2012 at 1:35 PM

## 2012-11-08 NOTE — Progress Notes (Signed)
Patient presents to establish care and have referral for dental work and eye exam.

## 2012-11-09 ENCOUNTER — Telehealth: Payer: Self-pay

## 2012-11-09 NOTE — Telephone Encounter (Signed)
Patient aware to stop medications appt given to follow up in offic

## 2012-11-09 NOTE — Telephone Encounter (Signed)
Message copied by Dorothe Pea on Fri Nov 09, 2012  9:45 AM ------      Message from: [facility], [place]      Created: Fri Nov 09, 2012  9:24 AM       He has patient to stop lisinopril, Aldactone and Motrin immediately. To come back within a week to discuss lab results. ------

## 2012-11-09 NOTE — Progress Notes (Signed)
Quick Note:  He has patient to stop lisinopril, Aldactone and Motrin immediately. To come back within a week to discuss lab results. ______

## 2012-11-15 ENCOUNTER — Encounter (INDEPENDENT_AMBULATORY_CARE_PROVIDER_SITE_OTHER): Payer: No Typology Code available for payment source | Admitting: *Deleted

## 2012-11-15 ENCOUNTER — Ambulatory Visit: Payer: No Typology Code available for payment source | Admitting: Family Medicine

## 2012-11-15 DIAGNOSIS — Z48812 Encounter for surgical aftercare following surgery on the circulatory system: Secondary | ICD-10-CM

## 2012-11-15 DIAGNOSIS — I739 Peripheral vascular disease, unspecified: Secondary | ICD-10-CM

## 2012-11-22 ENCOUNTER — Encounter: Payer: Self-pay | Admitting: Family Medicine

## 2012-11-22 ENCOUNTER — Ambulatory Visit: Payer: No Typology Code available for payment source | Attending: Family Medicine | Admitting: Family Medicine

## 2012-11-22 VITALS — BP 160/89 | HR 91 | Temp 98.2°F | Resp 16 | Ht 63.0 in | Wt 169.2 lb

## 2012-11-22 DIAGNOSIS — N183 Chronic kidney disease, stage 3 unspecified: Secondary | ICD-10-CM | POA: Insufficient documentation

## 2012-11-22 DIAGNOSIS — E119 Type 2 diabetes mellitus without complications: Secondary | ICD-10-CM

## 2012-11-22 DIAGNOSIS — I119 Hypertensive heart disease without heart failure: Secondary | ICD-10-CM

## 2012-11-22 DIAGNOSIS — N189 Chronic kidney disease, unspecified: Secondary | ICD-10-CM

## 2012-11-22 LAB — COMPLETE METABOLIC PANEL WITH GFR
ALT: 8 U/L (ref 0–35)
AST: 11 U/L (ref 0–37)
Albumin: 4 g/dL (ref 3.5–5.2)
Alkaline Phosphatase: 97 U/L (ref 39–117)
Calcium: 9.7 mg/dL (ref 8.4–10.5)
Chloride: 107 mEq/L (ref 96–112)
Potassium: 5 mEq/L (ref 3.5–5.3)
Sodium: 137 mEq/L (ref 135–145)

## 2012-11-22 LAB — GLUCOSE, POCT (MANUAL RESULT ENTRY): POC Glucose: 200 mg/dl — AB (ref 70–99)

## 2012-11-22 NOTE — Progress Notes (Signed)
Pt here for f/u post blood work Cardiology report needs reviewed with pt. Last cbg checked lastTthursay  Last A1C 11/08/12

## 2012-11-22 NOTE — Progress Notes (Signed)
Quick Note:  Please inform patient that kidney function has gotten worse. Have patient drink extra fluids over the next few days and recheck BMP in 1 week. I want patient to cut her metformin dose by 50% for safety. Please have patient reduce metformin dose to 500 mg po BID down from 1000 mg po BID. Please withhold from taking any lisinopril until the kidney function has stabilized and we have told her to take the lisinopril again. Please advise patient to avoid all NSAID medications.   Gerlene Fee, MD, CDE, [place], [place]   ______

## 2012-11-22 NOTE — Progress Notes (Signed)
Patient ID: [name], female   DOB: Feb 07, 1956, 57 y.o.   MRN: [id number]  CC: follow up   HPI: Pt is presenting to follow up . She had a recent ECHO and it showed LVH and EF 45-49%.  Pt is trying to better control her DM. She had a recent CHF exacerbation.  She was temporarily taken off her lisinopril.  PT has a long history of poorly controlled and difficult to control Hypertension.  She says it is better today than it has been in a long time.  She is followed by cardiology Dr. Irven Shelling office.  Pt denies CP, SOB and edema in legs/ feet.    No Known Allergies Past Medical History  Diagnosis Date  . Hypertension   . Diabetes mellitus   . Arthritis   . PVD (peripheral vascular disease)    Current Outpatient Prescriptions on File Prior to Visit  Medication Sig Dispense Refill  . aspirin 81 MG tablet Take 81 mg by mouth daily.      . carvedilol (COREG) 3.125 MG tablet Take 1 tablet (3.125 mg total) by mouth 2 (two) times daily with a meal.  60 tablet  3  . glipiZIDE (GLUCOTROL) 5 MG tablet Take 5 mg by mouth daily.      . metFORMIN (GLUCOPHAGE) 500 MG tablet Take 1,000 mg by mouth 2 (two) times daily with a meal.       . spironolactone (ALDACTONE) 25 MG tablet Take 1 tablet (25 mg total) by mouth daily.  14 tablet  0  . Ophthalmic Irrigation Solution (EYE [place] OP) Place 1 Applicatorful into both eyes as needed (to wash eyes out).       No current facility-administered medications on file prior to visit.   Family History  Problem Relation Age of Onset  . Diabetes Mother    History   Social History  . Marital Status: Married    Spouse Name: N/A    Number of Children: N/A  . Years of Education: N/A   Occupational History  . Not on file.   Social History Main Topics  . Smoking status: Never Smoker   . Smokeless tobacco: Never Used  . Alcohol Use: Yes     Comment: ocassionally  . Drug Use: No  . Sexually Active: Not on file   Other Topics Concern  . Not on file    Social History Narrative  . No narrative on file    Review of Systems  Constitutional: Negative for fever, chills, diaphoresis, activity change, appetite change and fatigue.  HENT: Negative for ear pain, nosebleeds, congestion, facial swelling, rhinorrhea, neck pain, neck stiffness and ear discharge.   Eyes: Negative for pain, discharge, redness, itching and visual disturbance.  Respiratory: Negative for cough, choking, chest tightness, shortness of breath, wheezing and stridor.   Cardiovascular: Negative for chest pain, palpitations and leg swelling.  Gastrointestinal: Negative for abdominal distention.  Genitourinary: Negative for dysuria, urgency, frequency, hematuria, flank pain, decreased urine volume, difficulty urinating and dyspareunia.  Musculoskeletal: Negative for back pain, joint swelling, arthralgias and gait problem.  Neurological: Negative for dizziness, tremors, seizures, syncope, facial asymmetry, speech difficulty, weakness, light-headedness, numbness and headaches.  Hematological: Negative for adenopathy. Does not bruise/bleed easily.  Psychiatric/Behavioral: Negative for hallucinations, behavioral problems, confusion, dysphoric mood, decreased concentration and agitation.    Objective:   Filed Vitals:   11/22/12 1323  BP: 160/89  Pulse: 91  Temp: 98.2 F (36.8 C)  Resp: 16    Physical Exam  Constitutional: Appears well-developed and well-nourished. No distress.  HENT: Normocephalic. External right and left ear normal. Oropharynx is clear and moist.  Eyes: Conjunctivae and EOM are normal. PERRLA, no scleral icterus.  Neck: Normal ROM. Neck supple. No JVD. No tracheal deviation. No thyromegaly.  CVS: RRR, S1/S2 +, no murmurs, no gallops, no carotid bruit.  Pulmonary: Effort and breath sounds normal, no stridor, rhonchi, wheezes, rales.  Abdominal: Soft. BS +,  no distension, tenderness, rebound or guarding.  Musculoskeletal: Normal range of motion. No edema  and no tenderness.  Lymphadenopathy: No lymphadenopathy noted, cervical, inguinal. Neuro: Alert. Normal reflexes, muscle tone coordination. No cranial nerve deficit. Skin: Skin is warm and dry. No rash noted. Not diaphoretic. No erythema. No pallor.  Psychiatric: Normal mood and affect. Behavior, judgment, thought content normal.   Lab Results  Component Value Date   WBC 5.9 10/30/2012   HGB 9.8* 10/30/2012   HCT 29.9* 10/30/2012   MCV 87.7 10/30/2012   PLT 298 10/30/2012   Lab Results  Component Value Date   CREATININE 1.29* 11/08/2012   BUN 25* 11/08/2012   NA 136 11/08/2012   K 4.4 11/08/2012   CL 103 11/08/2012   CO2 24 11/08/2012    Lab Results  Component Value Date   HGBA1C 6.6* 11/08/2012   Lipid Panel     Component Value Date/Time   CHOL 168 11/08/2012 1331   TRIG 95 11/08/2012 1331   HDL 45 11/08/2012 1331   CHOLHDL 3.7 11/08/2012 1331   VLDL 19 11/08/2012 1331   LDLCALC 104* 11/08/2012 1331       Assessment and plan:   Patient Active Problem List   Diagnosis Date Noted  . LVH (left ventricular hypertrophy) due to hypertensive disease 11/22/2012  . DM2 (diabetes mellitus, type 2) 11/08/2012  . HTN (hypertension) 11/08/2012  . Atherosclerosis of native arteries of the extremities with ulceration(440.23) 08/16/2012   Pt is very concerned about her CKD when we discussed her lab results today.  Recheck renal function today.    Pt needs to be restarted on lisinopril eventually when renal function is stable for renal protection and cardiac protection.  Will check renal function and get her restarted on low dose ACEI when appropriate. Monitor diuretic use (currently she is only on spironolatone).     REviewed ECHO report with patient today.  The patient verbalized understanding.    HTN, suboptimally controlled but improving per patient.  Continue working closely with cards to improve.   Discussed metformin contraindications with patient and may have to discontinue if renal  function gets much worse.  Also, may end up decreasing dose by 50% depending on results of today's lab tests. The patient verbalized understanding.    RTC in 3 months  The patient was given clear instructions to go to ER or return to medical center if symptoms don't improve, worsen or new problems develop.  The patient verbalized understanding.  The patient was told to call to get any lab results if not heard anything in the next week.    Gerlene Fee, MD, CDE, [place], [place]

## 2012-11-22 NOTE — Patient Instructions (Addendum)
Hypertension As your heart beats, it forces blood through your arteries. This force is your blood pressure. If the pressure is too high, it is called hypertension (HTN) or high blood pressure. HTN is dangerous because you may have it and not know it. High blood pressure may mean that your heart has to work harder to pump blood. Your arteries may be narrow or stiff. The extra work puts you at risk for heart disease, stroke, and other problems.  Blood pressure consists of two numbers, a higher number over a lower, 110/72, for example. It is stated as "110 over 72." The ideal is below 120 for the top number (systolic) and under 80 for the bottom (diastolic). Write down your blood pressure today. You should pay close attention to your blood pressure if you have certain conditions such as:  Heart failure.  Prior heart attack.  Diabetes  Chronic kidney disease.  Prior stroke.  Multiple risk factors for heart disease. To see if you have HTN, your blood pressure should be measured while you are seated with your arm held at the level of the heart. It should be measured at least twice. A one-time elevated blood pressure reading (especially in the Emergency Department) does not mean that you need treatment. There may be conditions in which the blood pressure is different between your right and left arms. It is important to see your caregiver soon for a recheck. Most people have essential hypertension which means that there is not a specific cause. This type of high blood pressure may be lowered by changing lifestyle factors such as:  Stress.  Smoking.  Lack of exercise.  Excessive weight.  Drug/tobacco/alcohol use.  Eating less salt. Most people do not have symptoms from high blood pressure until it has caused damage to the body. Effective treatment can often prevent, delay or reduce that damage. TREATMENT  When a cause has been identified, treatment for high blood pressure is directed at the  cause. There are a large number of medications to treat HTN. These fall into several categories, and your caregiver will help you select the medicines that are best for you. Medications may have side effects. You should review side effects with your caregiver. If your blood pressure stays high after you have made lifestyle changes or started on medicines,   Your medication(s) may need to be changed.  Other problems may need to be addressed.  Be certain you understand your prescriptions, and know how and when to take your medicine.  Be sure to follow up with your caregiver within the time frame advised (usually within two weeks) to have your blood pressure rechecked and to review your medications.  If you are taking more than one medicine to lower your blood pressure, make sure you know how and at what times they should be taken. Taking two medicines at the same time can result in blood pressure that is too low. SEEK IMMEDIATE MEDICAL CARE IF:  You develop a severe headache, blurred or changing vision, or confusion.  You have unusual weakness or numbness, or a faint feeling.  You have severe chest or abdominal pain, vomiting, or breathing problems. MAKE SURE YOU:   Understand these instructions.  Will watch your condition.  Will get help right away if you are not doing well or get worse. Document Released: 04/18/2005 Document Revised: 07/11/2011 Document Reviewed: 12/07/2007 [facility] Patient Information 2014 [place]. Chronic Kidney Disease Chronic kidney disease occurs when the kidneys are damaged over a long period.  The kidneys are two organs that lie on either side of the spine between the middle of the back and the front of the abdomen. The kidneys:   Remove wastes and extra water from the blood.   Produce important hormones. These help keep bones strong, regulate blood pressure, and help create red blood cells.   Balance the fluids and chemicals in the blood and  tissues. A small amount of kidney damage may not cause problems, but a large amount of damage may make it difficult or impossible for the kidneys to work the way they should. If steps are not taken to slow down the kidney damage or stop it from getting worse, the kidneys may stop working permanently. Most of the time, chronic kidney disease does not go away. However, it can often be controlled, and those with the disease can usually live normal lives. CAUSES  The most common causes of chronic kidney disease are diabetes and high blood pressure (hypertension). Chronic kidney disease may also be caused by:   Diseases that cause kidneys' filters to become inflamed.   Diseases that affect the immune system.   Genetic diseases.   Medicines that damage the kidneys, such as anti-inflammatory medicines.  Poisoning or exposure to toxic substances.   A reoccurring kidney or urinary infection.   A problem with urine flow. This may be caused by:   Cancer.   Kidney stones.   An enlarged prostate in males. SYMPTOMS  Because the kidney damage in chronic kidney disease occurs slowly, symptoms develop slowly and may not be obvious until the kidney damage becomes severe. A person may have a kidney disease for years without showing any symptoms. Symptoms can include:   Swelling (edema) of the legs, ankles, or feet.   Tiredness (lethargy).   Nausea or vomiting.   Confusion.   Problems with urination, such as:   Decreased urine production.   Frequent urination, especially at night.   Frequent accidents in children who are potty trained.   Muscle twitches and cramps.   Shortness of breath.  Weakness.   Persistent itchiness.   Loss of appetite.  Metallic taste in the mouth.  Trouble sleeping.  Slowed development in children.  Short stature in children. DIAGNOSIS  Chronic kidney disease may be detected and diagnosed by tests, including blood, urine, imaging,  or kidney biopsy tests.  TREATMENT  Most chronic kidney diseases cannot be cured. Treatment usually involves relieving symptoms and preventing or slowing the progression of the disease. Treatment may include:   A special diet. You may need to avoid alcohol and foods thatare salty and high in potassium.   Medicines. These may:   Lower blood pressure.   Relieve anemia.   Relieve swelling.   Protect the bones. HOME CARE INSTRUCTIONS   Follow your prescribed diet.   Only take over-the-counter or prescription medicines as directed by your caregiver.  Do not take any new medicines (prescription, over-the-counter, or nutritional supplements) unless approved by your caregiver. Many medicines can worsen your kidney damage or need to have the dose adjusted.   Quit smoking if you are a smoker. Talk to your caregiver about a smoking cessation program.   Keep all follow-up appointments as directed by your caregiver. SEEK IMMEDIATE MEDICAL CARE IF:  Your symptoms get worse or you develop new symptoms.   You develop symptoms of end-stage kidney disease. These include:   Headaches.   Abnormally dark or light skin.   Numbness in the hands or feet.  Easy bruising.   Frequent hiccups.   Menstruation stops.   You have a fever.   You have decreased urine production.   You havepain or bleeding when urinating. MAKE SURE YOU:  Understand these instructions.  Will watch your condition.  Will get help right away if you are not doing well or get worse. FOR MORE INFORMATION  American Association of Kidney Patients: BombTimer.gl National Kidney Foundation: www.kidney.[place]: https://mathis.com/ [facility]: www.lifeoptions.org and www.kidneyschool.org Document Released: 01/26/2008 Document Revised: 04/04/2012 Document Reviewed: 12/16/2011 [facility] Patient Information 2014 [place], [place].

## 2012-11-23 ENCOUNTER — Telehealth: Payer: Self-pay | Admitting: *Deleted

## 2012-11-23 NOTE — Telephone Encounter (Signed)
11/23/12 Patient made aware of lab results cut Metformin dose by 50%. Take Metformin 500mg  Bid and to withhold Lisinopril Per Dr. Wynetta Emery. Appointment schedule for 11/29/12 BMP  P[facility] BSN [facility]

## 2012-11-27 ENCOUNTER — Encounter: Payer: Self-pay | Admitting: Vascular Surgery

## 2012-11-28 ENCOUNTER — Ambulatory Visit (INDEPENDENT_AMBULATORY_CARE_PROVIDER_SITE_OTHER): Payer: No Typology Code available for payment source | Admitting: Vascular Surgery

## 2012-11-28 ENCOUNTER — Encounter: Payer: Self-pay | Admitting: Vascular Surgery

## 2012-11-28 VITALS — BP 172/90 | HR 98 | Resp 20 | Ht 63.0 in | Wt 170.0 lb

## 2012-11-28 DIAGNOSIS — I739 Peripheral vascular disease, unspecified: Secondary | ICD-10-CM

## 2012-11-28 NOTE — Progress Notes (Signed)
Patient is a 58 year old female who previously underwent left superficial femoral artery stenting in August of 2013 by my partner Dr. Bridgett Larsson. This was done for a nonhealing wound of her left first toe. The toe has now completely healed. She is not smoking. She denies claudication or rest pain symptoms. She does complain of some occasional burning and tingling in her left thigh. She has been seeing Dr. Terrence Dupont for what sounds like cardiomegaly and hypertension.  Chronic medical problems include the above plus diabetes. 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 She is on aspirin.  Past Medical History  Diagnosis Date  . Hypertension   . Diabetes mellitus   . Arthritis   . PVD (peripheral vascular disease)    Review of systems: She become short of breath with minimal exertion. She denies chest pain.  Current Outpatient Prescriptions on File Prior to Visit  Medication Sig Dispense Refill  . aspirin 81 MG tablet Take 81 mg by mouth daily.      . carvedilol (COREG) 3.125 MG tablet Take 1 tablet (3.125 mg total) by mouth 2 (two) times daily with a meal.  60 tablet  3  . glipiZIDE (GLUCOTROL) 5 MG tablet Take 5 mg by mouth daily.      . metFORMIN (GLUCOPHAGE) 500 MG tablet Take 1,000 mg by mouth 2 (two) times daily with a meal.       . Ophthalmic Irrigation Solution (EYE [place] OP) Place 1 Applicatorful into both eyes as needed (to wash eyes out).      [place] spironolactone (ALDACTONE) 25 MG tablet Take 1 tablet (25 mg total) by mouth daily.  14 tablet  0  . traMADol (ULTRAM) 50 MG tablet Take 50 mg by mouth every 6 (six) hours as needed for pain.       No current facility-administered medications on file prior to visit.     Physical exam:  Filed Vitals:   11/28/12 1449  BP: 172/90  Pulse: 98  Resp: 20  Height: 5\' 3"  (1.6 m)  Weight: 170 lb (77.111 kg)    Vascular: She has 2+ femoral pulses bilaterally, absent popliteal and pedal pulses.  Skin: Ulceration left first toe completely healed  with jagged irregular toe nail  Data: She had an arterial duplex exam performed of the left leg today with bilateral ABIs. ABI on the right was 0.72 left was 0.92 the left superficial femoral artery was patent with more than 50% stenosis indicating some possible progression of prior 9 stenosis  Assessment: Peripheral arterial disease with patent left superficial femoral artery angioplasty but suggestion of narrowing  Plan: Aortogram with bilateral lower extremity runoff possible intervention 12/21/2012. Risks benefits possible complications and procedure details were explained the patient today including but not limited to bleeding infection vessel injury. She understands and agrees to proceed.  Ruta Hinds, MD  [facility]  Office: [phone]  Pager: [phone]

## 2012-11-29 ENCOUNTER — Other Ambulatory Visit: Payer: No Typology Code available for payment source

## 2012-11-30 ENCOUNTER — Other Ambulatory Visit: Payer: Self-pay

## 2012-12-07 ENCOUNTER — Encounter (HOSPITAL_COMMUNITY): Payer: Self-pay | Admitting: Pharmacy Technician

## 2012-12-17 ENCOUNTER — Ambulatory Visit (INDEPENDENT_AMBULATORY_CARE_PROVIDER_SITE_OTHER): Payer: No Typology Code available for payment source | Admitting: Obstetrics & Gynecology

## 2012-12-17 ENCOUNTER — Encounter: Payer: Self-pay | Admitting: Obstetrics & Gynecology

## 2012-12-17 VITALS — BP 170/95 | HR 81 | Temp 97.9°F | Ht 63.0 in | Wt 166.3 lb

## 2012-12-17 DIAGNOSIS — Z01419 Encounter for gynecological examination (general) (routine) without abnormal findings: Secondary | ICD-10-CM

## 2012-12-17 NOTE — Patient Instructions (Addendum)
Pap Test A Pap test is a procedure done in a clinic office to evaluate cells that are on the surface of the cervix. The cervix is the lower portion of the uterus and upper portion of the vagina. For some women, the cervical region has the potential to form cancer. With consistent evaluations by your caregiver, this type of cancer can be prevented.  If a Pap test is abnormal, it is most often a result of a previous exposure to human papillomavirus (HPV). HPV is a virus that can infect the cells of the cervix and cause dysplasia. Dysplasia is where the cells no longer look normal. If a woman has been diagnosed with high-grade or severe dysplasia, they are at higher risk of developing cervical cancer. People diagnosed with low-grade dysplasia should still be seen by their caregiver because there is a small chance that low-grade dysplasia could develop into cancer.  LET YOUR CAREGIVER KNOW ABOUT:  Recent sexually transmitted infection (STI) you have had.  Any new sex partners you have had.  History of previous abnormal Pap tests results.  History of previous cervical procedures you have had (colposcopy, biopsy, loop electrosurgical excision procedure [LEEP]).  Concerns you have had regarding unusual vaginal discharge.  History of pelvic pain.  Your use of birth control. BEFORE THE PROCEDURE  Ask your caregiver when to schedule your Pap test. It is best not to be on your period if your caregiver uses a wooden spatula to collect cells or applies cells to a glass slide. Newer techniques are not so sensitive to the timing of a menstrual cycle.  Do not douche or have sexual intercourse for 24 hours before the test.   Do not use vaginal creams or tampons for 24 hours before the test.   Empty your bladder just before the test to lessen any discomfort.  PROCEDURE You will lie on an exam table with your feet in stirrups. A warm metal or plastic instrument (speculum) is placed in your vagina. This  instrument allows your caregiver to see the inside of your vagina and look at your cervix. A small, plastic brush or wooden spatula is then used to collect cervical cells. These cells are placed in a lab specimen container. The cells are looked at under a microscope. A specialist will determine if the cells are normal.  AFTER THE PROCEDURE Make sure to get your test results.If your results come back abnormal, you may need further testing.  Document Released: 07/09/2002 Document Revised: 07/11/2011 Document Reviewed: 04/14/2011 [facility] Patient Information 2014 [place], [place]. Mammogram Tips Healthy women should begin getting mammograms every year or two once they reach age 1, and once a year when they reach age 33. Here are tips:  Find an experienced, high-volume center with accomplished radiologists. You can ask for their credentials.  Ask to see the certificate showing the center is approved by the U.S. Food and Drug Administration.  Use the same center regularly, so it is easier to compare your new mammograms with your old ones.  Bring a list of places you have had mammograms, dates, biopsies or other breast treatments. Bring old mammograms with you or have them sent to your primary caregiver.  Describe any breast problems to your caregiver or the person doing the mammogram. Be ready to give past surgeries, birth control pills, hormone use, breast implants, growths, moles, breast scars and family or personal history of breast cancer.  Call your doctor or center to check on the mammogram if you hear nothing  within 10 days. Do not assume everything was normal.  To protect your privacy, the mammogram results cannot be given over the phone or to anyone but you.  Radiation from a mammogram is very low and does not pose a radiation risk.  Mammograms can detect breast problems other than breast cancer.  You may be asked stand or sit in front of the X-ray machine.  Two small plastic or  glass plates are placed around the breast when taking the X-ray.  If you are menstruating, schedule your mammogram a week after your menstrual period.  Do not wear deodorants, powder or perfume when getting a mammogram.  Wash your breasts and under your arms before getting a mammogram.  Wear cloths that are easy for you to undress and dress.  Arrive at the center at least 15 minutes before the mammogram is scheduled.  There may be slight discomfort during the mammogram, but it goes away shortly after the test.  Try to relax as much as possible during the mammogram.  Talk to your caregiver if you do not understand the results of the mammogram.  Follow the recommendations of your caregiver regarding further tests and treatments if needed.  Get a second opinion if you are concerned or question the results of the mammogram, further tests or treatment if needed.  Continue with monthly self-breast exams and yearly caregiver exams even if the mammogram is normal.  Your caregiver may recommend getting a mammogram before age 64 and more often if you are at high risk for developing breast cancer. Document Released: 08/04/2005 Document Revised: 07/11/2011 Document Reviewed: 04/11/2008 [facility] Patient Information 2014 [place].

## 2012-12-17 NOTE — Progress Notes (Signed)
Patient ID: [name], female   DOB: 16-Nov-1955, 57 y.o.   MRN: [id number] Subjective:     [name] is a 57 y.o. female here for a routine exam.  She is s/p SVD x 7 Current complaints: none. Patient reports that her last PAP was greater than 5 years previously. Pt denies incontinence.   Gynecologic History No LMP recorded. Patient is postmenopausal. Contraception: none Last Pap: >5years ago. Results were: normal Last mammogram: unknown. Results were: normal  Obstetric History OB History  No data available     The following portions of the patient's history were reviewed and updated as appropriate: allergies, current medications, past family history, past medical history, past social history, past surgical history and problem list.  Review of Systems Pertinent items are noted in HPI.    Objective:    BP 170/95  Pulse 81  Temp(Src) 97.9 F (36.6 C) (Oral)  Ht 5\' 3"  (1.6 m)  Wt 166 lb 4.8 oz (75.433 kg)  BMI 29.47 kg/m2  General Appearance:    Alert, cooperative, no distress, appears stated age                 Neck:   Supple, symmetrical, trachea midline, no adenopathy;    thyroid:  no enlargement/tenderness/nodules; no carotid   bruit or JVD  Back:     Symmetric, no curvature, ROM normal, no CVA tenderness  Lungs:     Clear to auscultation bilaterally, respirations unlabored  Chest Wall:    No tenderness or deformity   Heart:    Regular rate and rhythm, S1 and S2 normal, no murmur, rub   or gallop  Breast Exam:    No tenderness, masses, or nipple abnormality  Abdomen:     Soft, non-tender, bowel sounds active all four quadrants,    no masses, no organomegaly  Genitalia:    Normal female without lesion, discharge or tenderness  GU: EGBUS: no lesions Vagina: no blood in vault Cervix: no lesion; no mucopurulent d/c Uterus: small, mobile; prolapsed grade II Adnexa: no masses; non tender       Extremities:   Extremities normal, atraumatic, no cyanosis or  edema  Pulses:   2+ and symmetric all extremities  Skin:   Skin color, texture, turgor normal, no rashes or lesions  Lymph nodes:   Cervical, supraclavicular, and axillary nodes normal         Assessment:    Healthy female exam.  Pt scheduled for surgery in the next month    Plan:    Mammogram ordered.   F/u in 1 year or sooner prn

## 2012-12-20 ENCOUNTER — Ambulatory Visit (HOSPITAL_COMMUNITY)
Admission: RE | Admit: 2012-12-20 | Discharge: 2012-12-20 | Disposition: A | Discharge status: Home / Self Care | Payer: No Typology Code available for payment source | Source: Ambulatory Visit | Attending: Obstetrics & Gynecology | Admitting: Obstetrics & Gynecology

## 2012-12-20 ENCOUNTER — Other Ambulatory Visit: Payer: Self-pay | Admitting: Obstetrics & Gynecology

## 2012-12-20 DIAGNOSIS — Z1231 Encounter for screening mammogram for malignant neoplasm of breast: Secondary | ICD-10-CM | POA: Insufficient documentation

## 2012-12-20 DIAGNOSIS — Z01419 Encounter for gynecological examination (general) (routine) without abnormal findings: Secondary | ICD-10-CM

## 2012-12-21 ENCOUNTER — Telehealth: Payer: Self-pay | Admitting: Vascular Surgery

## 2012-12-21 ENCOUNTER — Other Ambulatory Visit: Payer: Self-pay | Admitting: *Deleted

## 2012-12-21 ENCOUNTER — Ambulatory Visit (HOSPITAL_COMMUNITY)
Admission: RE | Admit: 2012-12-21 | Discharge: 2012-12-21 | Disposition: A | Discharge status: Home / Self Care | Payer: No Typology Code available for payment source | Source: Ambulatory Visit | Attending: Vascular Surgery | Admitting: Vascular Surgery

## 2012-12-21 ENCOUNTER — Encounter (HOSPITAL_COMMUNITY)
Admission: RE | Disposition: A | Discharge status: Home / Self Care | Payer: Self-pay | Source: Ambulatory Visit | Attending: Vascular Surgery

## 2012-12-21 DIAGNOSIS — Z79899 Other long term (current) drug therapy: Secondary | ICD-10-CM | POA: Insufficient documentation

## 2012-12-21 DIAGNOSIS — Z7982 Long term (current) use of aspirin: Secondary | ICD-10-CM | POA: Insufficient documentation

## 2012-12-21 DIAGNOSIS — I10 Essential (primary) hypertension: Secondary | ICD-10-CM | POA: Insufficient documentation

## 2012-12-21 DIAGNOSIS — I70219 Atherosclerosis of native arteries of extremities with intermittent claudication, unspecified extremity: Secondary | ICD-10-CM

## 2012-12-21 DIAGNOSIS — Z888 Allergy status to other drugs, medicaments and biological substances status: Secondary | ICD-10-CM | POA: Insufficient documentation

## 2012-12-21 DIAGNOSIS — E119 Type 2 diabetes mellitus without complications: Secondary | ICD-10-CM | POA: Insufficient documentation

## 2012-12-21 DIAGNOSIS — I7092 Chronic total occlusion of artery of the extremities: Secondary | ICD-10-CM | POA: Insufficient documentation

## 2012-12-21 DIAGNOSIS — I739 Peripheral vascular disease, unspecified: Secondary | ICD-10-CM

## 2012-12-21 DIAGNOSIS — M129 Arthropathy, unspecified: Secondary | ICD-10-CM | POA: Insufficient documentation

## 2012-12-21 HISTORY — PX: ABDOMINAL AORTAGRAM: SHX5454

## 2012-12-21 LAB — POCT I-STAT, CHEM 8
BUN: 34 mg/dL — ABNORMAL HIGH (ref 6–23)
Calcium, Ion: 1.24 mmol/L — ABNORMAL HIGH (ref 1.12–1.23)
Glucose, Bld: 159 mg/dL — ABNORMAL HIGH (ref 70–99)
TCO2: 23 mmol/L (ref 0–100)

## 2012-12-21 SURGERY — ABDOMINAL AORTAGRAM
Anesthesia: LOCAL

## 2012-12-21 MED ORDER — DOCUSATE SODIUM 100 MG PO CAPS: 100.0000 mg | ORAL_CAPSULE | Freq: Every day | ORAL | Status: DC

## 2012-12-21 MED ORDER — LABETALOL HCL 5 MG/ML IV SOLN: 10.0000 mg | INTRAVENOUS | Status: DC | PRN

## 2012-12-21 MED ORDER — PHENOL 1.4 % MT LIQD: 1.0000 | OROMUCOSAL | Status: DC | PRN

## 2012-12-21 MED ORDER — SODIUM CHLORIDE 0.9 % IV SOLN
INTRAVENOUS | Status: DC
  Administered 2012-12-21: 1000 mL via INTRAVENOUS

## 2012-12-21 MED ORDER — GUAIFENESIN-DM 100-10 MG/5ML PO SYRP: 15.0000 mL | ORAL_SOLUTION | ORAL | Status: DC | PRN

## 2012-12-21 MED ORDER — MAGNESIUM SULFATE 40 MG/ML IJ SOLN: 2.0000 g | Freq: Once | INTRAMUSCULAR | Status: DC | PRN

## 2012-12-21 MED ORDER — PANTOPRAZOLE SODIUM 40 MG PO TBEC: 40.0000 mg | DELAYED_RELEASE_TABLET | Freq: Every day | ORAL | Status: DC

## 2012-12-21 MED ORDER — METOPROLOL TARTRATE 1 MG/ML IV SOLN: 2.0000 mg | INTRAVENOUS | Status: DC | PRN

## 2012-12-21 MED ORDER — DEXTROSE 5 % IV SOLN: 1.5000 g | Freq: Two times a day (BID) | INTRAVENOUS | Status: DC

## 2012-12-21 MED ORDER — HYDRALAZINE HCL 20 MG/ML IJ SOLN: 10.0000 mg | INTRAMUSCULAR | Status: DC | PRN

## 2012-12-21 MED ORDER — ACETAMINOPHEN 325 MG RE SUPP: 325.0000 mg | RECTAL | Status: DC | PRN

## 2012-12-21 MED ORDER — ACETAMINOPHEN 325 MG PO TABS: 325.0000 mg | ORAL_TABLET | ORAL | Status: DC | PRN

## 2012-12-21 MED ORDER — POTASSIUM CHLORIDE CRYS ER 20 MEQ PO TBCR: 20.0000 meq | EXTENDED_RELEASE_TABLET | Freq: Once | ORAL | Status: DC | PRN

## 2012-12-21 MED ORDER — LIDOCAINE HCL (PF) 1 % IJ SOLN
INTRAMUSCULAR | Status: AC
  Filled 2012-12-21: qty 30

## 2012-12-21 MED ORDER — ONDANSETRON HCL 4 MG/2ML IJ SOLN: 4.0000 mg | Freq: Four times a day (QID) | INTRAMUSCULAR | Status: DC | PRN

## 2012-12-21 MED ORDER — ALUM & MAG HYDROXIDE-SIMETH 200-200-20 MG/5ML PO SUSP: 15.0000 mL | ORAL | Status: DC | PRN

## 2012-12-21 MED ORDER — SODIUM CHLORIDE 0.45 % IV SOLN
INTRAVENOUS | Status: DC
  Administered 2012-12-21: 500 mL via INTRAVENOUS

## 2012-12-21 MED ORDER — LABETALOL HCL 5 MG/ML IV SOLN
INTRAVENOUS | Status: AC
  Filled 2012-12-21: qty 4

## 2012-12-21 MED ORDER — HEPARIN (PORCINE) IN NACL 2-0.9 UNIT/ML-% IJ SOLN
INTRAMUSCULAR | Status: AC
  Filled 2012-12-21: qty 500

## 2012-12-21 MED ORDER — MORPHINE SULFATE 10 MG/ML IJ SOLN: 2.0000 mg | INTRAMUSCULAR | Status: DC | PRN

## 2012-12-21 NOTE — Telephone Encounter (Signed)
Message copied by Georgiann Mccoy on Fri Dec 21, 2012  9:59 AM ------      Message from: Elam Dutch      Created: Fri Dec 21, 2012  8:27 AM       Aortogram with left lower extremity runoff            She needs a follow up office visit with arterial duplex in 6 months.            Charles ------

## 2012-12-21 NOTE — Interval H&P Note (Signed)
History and Physical Interval Note:  12/21/2012 7:33 AM  [name]  has presented today for surgery, with the diagnosis of PVD  The various methods of treatment have been discussed with the patient and family. After consideration of risks, benefits and other options for treatment, the patient has consented to  Procedure(s): ABDOMINAL AORTAGRAM (N/A) as a surgical intervention .  The patient's history has been reviewed, patient examined, no change in status, stable for surgery.  I have reviewed the patient's chart and labs.  Questions were answered to the patient's satisfaction.     Jermisha Hoffart E

## 2012-12-21 NOTE — H&P (View-Only) (Signed)
Patient is a 58 year old female who previously underwent left superficial femoral artery stenting in August of 2013 by my partner Dr. Bridgett Larsson. This was done for a nonhealing wound of her left first toe. The toe has now completely healed. She is not smoking. She denies claudication or rest pain symptoms. She does complain of some occasional burning and tingling in her left thigh. She has been seeing Dr. Terrence Dupont for what sounds like cardiomegaly and hypertension.  Chronic medical problems include the above plus diabetes. 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 She is on aspirin.  Past Medical History  Diagnosis Date  . Hypertension   . Diabetes mellitus   . Arthritis   . PVD (peripheral vascular disease)    Review of systems: She become short of breath with minimal exertion. She denies chest pain.  Current Outpatient Prescriptions on File Prior to Visit  Medication Sig Dispense Refill  . aspirin 81 MG tablet Take 81 mg by mouth daily.      . carvedilol (COREG) 3.125 MG tablet Take 1 tablet (3.125 mg total) by mouth 2 (two) times daily with a meal.  60 tablet  3  . glipiZIDE (GLUCOTROL) 5 MG tablet Take 5 mg by mouth daily.      . metFORMIN (GLUCOPHAGE) 500 MG tablet Take 1,000 mg by mouth 2 (two) times daily with a meal.       . Ophthalmic Irrigation Solution (EYE [facility] OP) Place 1 Applicatorful into both eyes as needed (to wash eyes out).      Marland Kitchen spironolactone (ALDACTONE) 25 MG tablet Take 1 tablet (25 mg total) by mouth daily.  14 tablet  0  . traMADol (ULTRAM) 50 MG tablet Take 50 mg by mouth every 6 (six) hours as needed for pain.       No current facility-administered medications on file prior to visit.     Physical exam:  Filed Vitals:   11/28/12 1449  BP: 172/90  Pulse: 98  Resp: 20  Height: 5\' 3"  (1.6 m)  Weight: 170 lb (77.111 kg)    Vascular: She has 2+ femoral pulses bilaterally, absent popliteal and pedal pulses.  Skin: Ulceration left first toe completely healed  with jagged irregular toe nail  Data: She had an arterial duplex exam performed of the left leg today with bilateral ABIs. ABI on the right was 0.72 left was 0.92 the left superficial femoral artery was patent with more than 50% stenosis indicating some possible progression of prior 9 stenosis  Assessment: Peripheral arterial disease with patent left superficial femoral artery angioplasty but suggestion of narrowing  Plan: Aortogram with bilateral lower extremity runoff possible intervention 12/21/2012. Risks benefits possible complications and procedure details were explained the patient today including but not limited to bleeding infection vessel injury. She understands and agrees to proceed.  Ruta Hinds, MD  [facility]  Office: [phone]  Pager: [phone]

## 2012-12-21 NOTE — Op Note (Signed)
[facility]           [facility]   Procedure: Aortogram with right lower extremity runoff  Preoperative diagnosis: Claudication  Postoperative diagnosis: Same  Anesthesia Local  Operative details: After obtaining informed consent, the patient was taken to the [facility]. The patient was placed in supine position on the Angio table. Both groins were prepped and draped in usual sterile fashion. Local anesthesia was infiltrated over the right common femoral artery. Initially, ultrasound was used to identify the common femoral artery    An introducer needle was used to cannulate the right common femoral artery and 035 versacore wire threaded into the abdominal aorta under fluoroscopic guidance. Next a 5 French sheath is placed over the guidewire in the right common femoral artery. A 5 French pigtail catheter was placed over the guidewire into the abdominal aorta and abdominal aortogram was obtained. The infrarenal abdominal aorta is patent. The left and right common iliac arteries are patent. The left external and internal iliac arteries are patent. The pigtail catheter was pulled in to the aortic bifurcation. A magnified view of the pelvis was obtained in AP and LAO projection to confirm that there was no significant left common iliac stenosis.      In order to get increased opacification of the tibials a 5 Fr crossover catheter was brought up on the field.  The crossover catheter was used to selectively catheterize the left common iliac artery and the guidewire advanced into the left distal external iliac artery. The crossover catheter was removed and replaced with a 5 French straight catheter. Angiogram was then performed the left lower extremity.   Next the 5 French straight catheter was removed over a guidewire. The 5 French sheath was then connected to obtain a left lower extremity runoff.  The left common femoral artery is patent. The left profunda femoris artery is patent  but small and is blunted distally suggesting diabetic disease. The left superficial femoral artery is patent. There is a 90% stenosis at the adductor hiatus. This is short and focal. The distal superficial femoral artery and above-knee popliteal artery is small and diffusely diseased. The below-knee popliteal artery is patent but small. There is severe tibial vessel occlusive disease below this. The posterior tibial and anterior tibial arteries are occluded. The peroneal has several areas of skip type patency. However it is a very diseased vessel but does fill the foot distally.  At this point it was decided not to intervene on the left superficial femoral artery stenosis. The patient currently does not have limb threatening ischemia by symptoms or by loss of tissue. She also has an allergy to Plavix and cannot take this. We will continue to follow her clinically. If she develops significant rest pain or a nonhealing wound consideration could be given for superficial femoral artery angioplasty with stenting would probably not be likely due to the small size of the vessel. The native superficial femoral artery is approximately 5 mm in diameter. She also has very poor runoff.  The 5 Fr sheath was left in place to be pulled in the holding area. The patient tolerated the procedure well and there were no complications. Patient was taken to the holding area in stable condition.  Operative findings: 90% left superficial femoral artery stenosis with severe tibial disease occlusion of the anterior and posterior tibial arteries with segmental occlusion of a very diseased peroneal artery   Operative management: The patient currently does not  have limb threatening ischemia by symptoms or by loss of tissue.  She also has an allergy to Plavix and cannot take this. We will continue to follow her clinically. If she develops significant rest pain or a nonhealing wound consideration could be given for superficial femoral artery  angioplasty with stenting would probably not be likely due to the small size of the vessel. The native superficial femoral artery is approximately 5 mm in diameter. She also has very poor runoff.   Ruta Hinds, MD [facility] Office: [phone] Pager: [phone]

## 2012-12-28 HISTORY — PX: ABDOMINAL ANGIOGRAM: SHX5705

## 2013-01-23 ENCOUNTER — Encounter: Payer: Self-pay | Admitting: *Deleted

## 2013-02-22 ENCOUNTER — Encounter: Payer: Self-pay | Admitting: Internal Medicine

## 2013-02-22 ENCOUNTER — Ambulatory Visit: Payer: No Typology Code available for payment source | Attending: Family Medicine | Admitting: Internal Medicine

## 2013-02-22 VITALS — BP 201/113 | HR 87 | Temp 98.0°F | Resp 16 | Ht 63.0 in | Wt 163.0 lb

## 2013-02-22 DIAGNOSIS — E119 Type 2 diabetes mellitus without complications: Secondary | ICD-10-CM

## 2013-02-22 DIAGNOSIS — N189 Chronic kidney disease, unspecified: Secondary | ICD-10-CM

## 2013-02-22 DIAGNOSIS — M1610 Unilateral primary osteoarthritis, unspecified hip: Secondary | ICD-10-CM | POA: Insufficient documentation

## 2013-02-22 DIAGNOSIS — M1612 Unilateral primary osteoarthritis, left hip: Secondary | ICD-10-CM | POA: Insufficient documentation

## 2013-02-22 DIAGNOSIS — I10 Essential (primary) hypertension: Secondary | ICD-10-CM

## 2013-02-22 DIAGNOSIS — M169 Osteoarthritis of hip, unspecified: Secondary | ICD-10-CM

## 2013-02-22 LAB — CMP AND LIVER
Alkaline Phosphatase: 182 U/L — ABNORMAL HIGH (ref 39–117)
BUN: 23 mg/dL (ref 6–23)
Bilirubin, Direct: 0.1 mg/dL (ref 0.0–0.3)
Creat: 1.18 mg/dL — ABNORMAL HIGH (ref 0.50–1.10)
Glucose, Bld: 171 mg/dL — ABNORMAL HIGH (ref 70–99)
Indirect Bilirubin: 0.3 mg/dL (ref 0.0–0.9)
Sodium: 138 mEq/L (ref 135–145)
Total Bilirubin: 0.4 mg/dL (ref 0.3–1.2)
Total Protein: 8.4 g/dL — ABNORMAL HIGH (ref 6.0–8.3)

## 2013-02-22 LAB — HEMOGLOBIN A1C: Hgb A1c MFr Bld: 8.5 % — ABNORMAL HIGH (ref <5.7)

## 2013-02-22 MED ORDER — ACETAMINOPHEN-CODEINE #3 300-30 MG PO TABS: 1.0000 | ORAL_TABLET | ORAL | Status: DC | PRN

## 2013-02-22 MED ORDER — CLONIDINE HCL 0.1 MG PO TABS
0.1000 mg | ORAL_TABLET | Freq: Once | ORAL | Status: AC
  Administered 2013-02-22: 0.1 mg via ORAL

## 2013-02-22 MED ORDER — CARVEDILOL 6.25 MG PO TABS: 6.2500 mg | ORAL_TABLET | Freq: Two times a day (BID) | ORAL | Status: DC

## 2013-02-22 NOTE — Progress Notes (Signed)
Patient ID: RACQUEL LANZO, female   DOB: 07/20/1955, 57 y.o.   MRN: [id number] Patient Demographics  Lateshia Tomlinson, is a 57 y.o. female  [id number]  [id number]  DOB - 1955/12/18  Chief Complaint  Patient presents with  . Follow-up        Subjective:   Arkie Martucci is a 57 y.o. female here today for a follow up visit. Patient claims she continues to have pain in her left hip, she said it feels like bone-on-bone, and this makes her blood pressure go up because she's been very severe pain. No history of fall. She wonders if we could add additional pain meds.  Patient has No headache, No chest pain, No abdominal pain - No Nausea, No new weakness tingling or numbness, No Cough - SOB.  ALLERGIES: Allergies  Allergen Reactions  . Plavix [Clopidogrel Bisulfate] Rash    PAST MEDICAL HISTORY: Past Medical History  Diagnosis Date  . Hypertension   . Diabetes mellitus   . Arthritis   . PVD (peripheral vascular disease)     MEDICATIONS AT HOME: Prior to Admission medications   Medication Sig Start Date End Date Taking? Authorizing Provider  aspirin 81 MG tablet Take 81 mg by mouth daily.   Yes Historical Provider, MD  carvedilol (COREG) 6.25 MG tablet Take 1 tablet (6.25 mg total) by mouth 2 (two) times daily with a meal. 02/22/13  Yes Angelica Chessman, MD  glipiZIDE (GLUCOTROL) 5 MG tablet Take 5 mg by mouth daily.   Yes Historical Provider, MD  metFORMIN (GLUCOPHAGE) 500 MG tablet Take 500 mg by mouth 2 (two) times daily with a meal.    Yes Historical Provider, MD  spironolactone (ALDACTONE) 25 MG tablet Take 1 tablet (25 mg total) by mouth daily. 10/30/12  Yes Shari A Upstill, PA-C  traMADol (ULTRAM) 50 MG tablet Take 50 mg by mouth every 6 (six) hours as needed for pain.   Yes Historical Provider, MD  acetaminophen-codeine (TYLENOL #3) 300-30 MG per tablet Take 1 tablet by mouth every 4 (four) hours as needed for pain. 02/22/13   Angelica Chessman, MD  OVER THE COUNTER  MEDICATION Apply 1 drop to eye daily as needed. Eye drops for redness    Historical Provider, MD     Objective:   Filed Vitals:   02/22/13 1013  BP: 201/113  Pulse: 87  Temp: 98 F (36.7 C)  TempSrc: Oral  Resp: 16  Height: 5\' 3"  (1.6 m)  Weight: 163 lb (73.936 kg)  SpO2: 100%    Exam General appearance : Awake, alert, not in any distress. Speech Clear. Not toxic looking HEENT: Atraumatic and Normocephalic, pupils equally reactive to light and accomodation Neck: supple, no JVD. No cervical lymphadenopathy.  Chest:Good air entry bilaterally, no added sounds  CVS: S1 S2 regular, no murmurs.  Abdomen: Bowel sounds present, Non tender and not distended with no gaurding, rigidity or rebound. Extremities: B/L Lower Ext shows no edema, both legs are warm to touch Neurology: Awake alert, and oriented X 3, CN II-XII intact, Non focal Skin:No Rash Wounds:N/A   Data Review   CBC No results found for this basename: WBC, HGB, HCT, PLT, MCV, MCH, MCHC, RDW, NEUTRABS, LYMPHSABS, MONOABS, EOSABS, BASOSABS, BANDABS, BANDSABD,  in the last 168 hours  Chemistries   No results found for this basename: NA, K, CL, CO2, GLUCOSE, BUN, CREATININE, GFRCGP, CALCIUM, MG, AST, ALT, ALKPHOS, BILITOT,  in the last 168 hours ------------------------------------------------------------------------------------------------------------------ No results found for this basename: HGBA1C,  in the last 72 hours ------------------------------------------------------------------------------------------------------------------ No results found for this basename: CHOL, HDL, LDLCALC, TRIG, CHOLHDL, LDLDIRECT,  in the last 72 hours ------------------------------------------------------------------------------------------------------------------ No results found for this basename: TSH, T4TOTAL, FREET3, T3FREE, THYROIDAB,  in the last 72  hours ------------------------------------------------------------------------------------------------------------------ No results found for this basename: VITAMINB12, FOLATE, FERRITIN, TIBC, IRON, RETICCTPCT,  in the last 72 hours  Coagulation profile  No results found for this basename: INR, PROTIME,  in the last 168 hours    Assessment & Plan   Patient Active Problem List   Diagnosis Date Noted  . Osteoarthritis of left hip 02/22/2013  . PVD (peripheral vascular disease) 11/28/2012  . LVH (left ventricular hypertrophy) due to hypertensive disease 11/22/2012  . CKD (chronic kidney disease) 11/22/2012  . DM2 (diabetes mellitus, type 2) 11/08/2012  . HTN (hypertension) 11/08/2012  . Atherosclerosis of native arteries of the extremities with ulceration(440.23) 08/16/2012     Plan: Repeat the following tests:  Comprehensive metabolic panel  Hemoglobin A1c  Urine microalbumin  Urine microalbumin/creatinine ratio  Add Tylenol No. 3 one tablet by mouth every 4 hour when necessary pain to tramadol for arthritis Patient has been extensively counseled about pain  Accelerated hypertension   Give patient clonidine tablet 0.1 mg by mouth now, repeat blood pressure in 30 minutes  Increase carvedilol tablet to 6.25 mg by mouth twice a day  Patient has been extensively counseled about hypertension and blood pressure goal, we discussed complications of uncontrolled hypertension Patient was counseled extensively about medication compliance Patient may benefit from orthopedic evaluation and possible hip replacement, but patient has no insurance Patient advised to avoid NSAIDs in view of the kidney failure  Health Maintenance -Pap Smear: Done recently normal -Mammogram: Done recently normal -Vaccinations:  -Influenza already given  Follow up in 2 weeks for blood pressure check  The patient was given clear instructions to go to ER or return to medical center if symptoms don't  improve, worsen or new problems develop. The patient verbalized understanding. The patient was told to call to get lab results if they haven't heard anything in the next week.    Angelica Chessman, MD, [place], [place], [place] [facility] [place], [place]   02/22/2013, 10:43 AM

## 2013-02-22 NOTE — Patient Instructions (Signed)
Diabetes and Exercise Regular exercise is important and can help:   Control blood glucose (sugar).  Decrease blood pressure.    Control blood lipids (cholesterol, triglycerides).  Improve overall health. BENEFITS FROM EXERCISE Improved fitness.DASH Diet The DASH diet stands for "Dietary Approaches to Stop Hypertension." It is a healthy eating plan that has been shown to reduce high blood pressure (hypertension) in as little as 14 days, while also possibly providing other significant health benefits. These other health benefits include reducing the risk of breast cancer after menopause and reducing the risk of type 2 diabetes, heart disease, colon cancer, and stroke. Health benefits also include weight loss and slowing kidney failure in patients with chronic kidney disease.  DIET GUIDELINES Limit salt (sodium). Your diet should contain less than 1500 mg of sodium daily. Limit refined or processed carbohydrates. Your diet should include mostly whole grains. Desserts and added sugars should be used sparingly. Include small amounts of heart-healthy fats. These types of fats include nuts, oils, and tub margarine. Limit saturated and trans fats. These fats have been shown to be harmful in the body. CHOOSING FOODS  The following food groups are based on a 2000 calorie diet. See your Registered Dietitian for individual calorie needs. Grains and Grain Products (6 to 8 servings daily) Eat More Often: Whole-wheat bread, brown rice, whole-grain or wheat pasta, quinoa, popcorn without added fat or salt (air popped). Eat Less Often: White bread, white pasta, white rice, cornbread. Vegetables (4 to 5 servings daily) Eat More Often: Fresh, frozen, and canned vegetables. Vegetables may be raw, steamed, roasted, or grilled with a minimal amount of fat. Eat Less Often/Avoid: Creamed or fried vegetables. Vegetables in a cheese sauce. Fruit (4 to 5 servings daily) Eat More Often: All fresh, canned (in natural  juice), or frozen fruits. Dried fruits without added sugar. One hundred percent fruit juice ( cup [237 mL] daily). Eat Less Often: Dried fruits with added sugar. Canned fruit in light or heavy syrup. [employer], Fish, and Poultry (2 servings or less daily. One serving is 3 to 4 oz [85-114 g]). Eat More Often: Ninety percent or leaner ground beef, tenderloin, sirloin. Round cuts of beef, chicken breast, [place] breast. All fish. Grill, bake, or broil your meat. Nothing should be fried. Eat Less Often/Avoid: Fatty cuts of meat, [place], or chicken leg, thigh, or wing. Fried cuts of meat or fish. Dairy (2 to 3 servings) Eat More Often: Low-fat or fat-free milk, low-fat plain or light yogurt, reduced-fat or part-skim cheese. Eat Less Often/Avoid: Milk (whole, 2%).Whole milk yogurt. Full-fat cheeses. Nuts, Seeds, and Legumes (4 to 5 servings per week) Eat More Often: All without added salt. Eat Less Often/Avoid: Salted nuts and seeds, canned beans with added salt. Fats and Sweets (limited) Eat More Often: Vegetable oils, tub margarines without trans fats, sugar-free gelatin. Mayonnaise and salad dressings. Eat Less Often/Avoid: Coconut oils, palm oils, butter, stick margarine, cream, half and half, cookies, candy, pie. FOR MORE INFORMATION The Dash Diet Eating Plan: www.dashdiet.org Document Released: 04/07/2011 Document Revised: 07/11/2011 Document Reviewed: 04/07/2011 [facility] Patient Information 2014 [place], [place]. Hypertension As your heart beats, it forces blood through your arteries. This force is your blood pressure. If the pressure is too high, it is called hypertension (HTN) or high blood pressure. HTN is dangerous because you may have it and not know it. High blood pressure may mean that your heart has to work harder to pump blood. Your arteries may be narrow or stiff. The extra work  puts you at risk for heart disease, stroke, and other problems.  Blood pressure consists of two numbers, a  higher number over a lower, 110/72, for example. It is stated as "110 over 72." The ideal is below 120 for the top number (systolic) and under 80 for the bottom (diastolic). Write down your blood pressure today. You should pay close attention to your blood pressure if you have certain conditions such as: Heart failure. Prior heart attack. Diabetes Chronic kidney disease. Prior stroke. Multiple risk factors for heart disease. To see if you have HTN, your blood pressure should be measured while you are seated with your arm held at the level of the heart. It should be measured at least twice. A one-time elevated blood pressure reading (especially in the Emergency Department) does not mean that you need treatment. There may be conditions in which the blood pressure is different between your right and left arms. It is important to see your caregiver soon for a recheck. Most people have essential hypertension which means that there is not a specific cause. This type of high blood pressure may be lowered by changing lifestyle factors such as: Stress. Smoking. Lack of exercise. Excessive weight. Drug/tobacco/alcohol use. Eating less salt. Most people do not have symptoms from high blood pressure until it has caused damage to the body. Effective treatment can often prevent, delay or reduce that damage. TREATMENT  When a cause has been identified, treatment for high blood pressure is directed at the cause. There are a large number of medications to treat HTN. These fall into several categories, and your caregiver will help you select the medicines that are best for you. Medications may have side effects. You should review side effects with your caregiver. If your blood pressure stays high after you have made lifestyle changes or started on medicines,  Your medication(s) may need to be changed. Other problems may need to be addressed. Be certain you understand your prescriptions, and know how and when to  take your medicine. Be sure to follow up with your caregiver within the time frame advised (usually within two weeks) to have your blood pressure rechecked and to review your medications. If you are taking more than one medicine to lower your blood pressure, make sure you know how and at what times they should be taken. Taking two medicines at the same time can result in blood pressure that is too low. SEEK IMMEDIATE MEDICAL CARE IF: You develop a severe headache, blurred or changing vision, or confusion. You have unusual weakness or numbness, or a faint feeling. You have severe chest or abdominal pain, vomiting, or breathing problems. MAKE SURE YOU:  Understand these instructions. Will watch your condition. Will get help right away if you are not doing well or get worse. Document Released: 04/18/2005 Document Revised: 07/11/2011 Document Reviewed: 12/07/2007 [facility] Patient Information 2014 [place].    Improved flexibility.  Improved endurance.  Increased bone density.  Weight control.  Increased muscle strength.  Decreased body fat.  Improvement of the body's use of insulin, a hormone.  Increased insulin sensitivity.  Reduction of insulin needs.  Reduced stress and tension.  Helps you feel better. People with diabetes who add exercise to their lifestyle gain additional benefits, including:  Weight loss.  Reduced appetite.  Improvement of the body's use of blood glucose.  Decreased risk factors for heart disease:  Lowering of cholesterol and triglycerides.  Raising the level of good cholesterol (high-density lipoproteins, HDL).  Lowering blood sugar.  Decreased blood pressure. TYPE 1 DIABETES AND EXERCISE  Exercise will usually lower your blood glucose.  If blood glucose is greater than 240 mg/dl, check urine ketones. If ketones are present, do not exercise.  Location of the insulin injection sites may need to be adjusted with exercise. Avoid  injecting insulin into areas of the body that will be exercised. For example, avoid injecting insulin into:  The arms when playing tennis.  The legs when jogging. For more information, discuss this with your caregiver.  Keep a record of:  Food intake.  Type and amount of exercise.  Expected peak times of insulin action.  Blood glucose levels. Do this before, during, and after exercise. Review your records with your caregiver. This will help you to develop guidelines for adjusting food intake and insulin amounts.  TYPE 2 DIABETES AND EXERCISE  Regular physical activity can help control blood glucose.  Exercise is important because it may:  Increase the body's sensitivity to insulin.  Improve blood glucose control.  Exercise reduces the risk of heart disease. It decreases serum cholesterol and triglycerides. It also lowers blood pressure.  Those who take insulin or oral hypoglycemic agents should watch for signs of hypoglycemia. These signs include dizziness, shaking, sweating, chills, and confusion.  Body water is lost during exercise. It must be replaced. This will help to avoid loss of body fluids (dehydration) or heat stroke. Be sure to talk to your caregiver before starting an exercise program to make sure it is safe for you. Remember, any activity is better than none.  Document Released: 07/09/2003 Document Revised: 07/11/2011 Document Reviewed: 10/23/2008 [facility] Patient Information 2014 [place], [place].

## 2013-02-22 NOTE — Progress Notes (Signed)
Pt is here for a f/u visit Pt is here for diabetes and HTN management. Pt reports that she is still having hip and leg pain. Pain is a 10, nagging, aching, constant and she has been dealing with this pain for 10 years.

## 2013-02-23 LAB — MICROALBUMIN / CREATININE URINE RATIO
Creatinine, Urine: 114.4 mg/dL
Microalb Creat Ratio: 27.8 mg/g (ref 0.0–30.0)
Microalb, Ur: 3.18 mg/dL — ABNORMAL HIGH (ref 0.00–1.89)

## 2013-04-18 ENCOUNTER — Other Ambulatory Visit: Payer: Self-pay | Admitting: Vascular Surgery

## 2013-04-18 DIAGNOSIS — I739 Peripheral vascular disease, unspecified: Secondary | ICD-10-CM

## 2013-05-16 ENCOUNTER — Other Ambulatory Visit (HOSPITAL_COMMUNITY): Payer: Self-pay | Admitting: Cardiology

## 2013-05-16 DIAGNOSIS — R079 Chest pain, unspecified: Secondary | ICD-10-CM

## 2013-05-17 ENCOUNTER — Encounter (HOSPITAL_COMMUNITY)
Admission: RE | Admit: 2013-05-17 | Discharge: 2013-05-17 | Disposition: A | Discharge status: Home / Self Care | Payer: No Typology Code available for payment source | Source: Ambulatory Visit | Attending: Cardiology | Admitting: Cardiology

## 2013-05-17 ENCOUNTER — Other Ambulatory Visit: Payer: Self-pay

## 2013-05-17 DIAGNOSIS — R079 Chest pain, unspecified: Secondary | ICD-10-CM | POA: Insufficient documentation

## 2013-05-17 MED ORDER — TECHNETIUM TC 99M SESTAMIBI - CARDIOLITE
30.0000 | Freq: Once | INTRAVENOUS | Status: AC | PRN
  Administered 2013-05-17: 11:00:00 30 via INTRAVENOUS

## 2013-05-17 MED ORDER — TECHNETIUM TC 99M SESTAMIBI - CARDIOLITE
10.0000 | Freq: Once | INTRAVENOUS | Status: AC | PRN
  Administered 2013-05-17: 10:00:00 10 via INTRAVENOUS

## 2013-05-17 MED ORDER — REGADENOSON 0.4 MG/5ML IV SOLN: 0.4000 mg | Freq: Once | INTRAVENOUS | Status: AC

## 2013-05-17 MED ORDER — REGADENOSON 0.4 MG/5ML IV SOLN
INTRAVENOUS | Status: AC
  Administered 2013-05-17: 0.4 mg via INTRAVENOUS
  Filled 2013-05-17: qty 5

## 2013-05-29 ENCOUNTER — Ambulatory Visit: Payer: Medicaid Other | Admitting: Internal Medicine

## 2013-06-26 ENCOUNTER — Encounter: Payer: Self-pay | Admitting: Vascular Surgery

## 2013-06-27 ENCOUNTER — Ambulatory Visit: Payer: Self-pay | Admitting: Vascular Surgery

## 2013-06-27 ENCOUNTER — Encounter (HOSPITAL_COMMUNITY): Payer: No Typology Code available for payment source

## 2013-06-27 ENCOUNTER — Other Ambulatory Visit (HOSPITAL_COMMUNITY): Payer: Self-pay

## 2013-07-06 ENCOUNTER — Emergency Department (HOSPITAL_COMMUNITY): Payer: No Typology Code available for payment source

## 2013-07-06 ENCOUNTER — Encounter (HOSPITAL_COMMUNITY): Payer: Self-pay | Admitting: Emergency Medicine

## 2013-07-06 DIAGNOSIS — M129 Arthropathy, unspecified: Secondary | ICD-10-CM | POA: Diagnosis present

## 2013-07-06 DIAGNOSIS — J189 Pneumonia, unspecified organism: Secondary | ICD-10-CM | POA: Diagnosis present

## 2013-07-06 DIAGNOSIS — J9600 Acute respiratory failure, unspecified whether with hypoxia or hypercapnia: Secondary | ICD-10-CM | POA: Diagnosis not present

## 2013-07-06 DIAGNOSIS — Z7982 Long term (current) use of aspirin: Secondary | ICD-10-CM

## 2013-07-06 DIAGNOSIS — E119 Type 2 diabetes mellitus without complications: Secondary | ICD-10-CM | POA: Diagnosis present

## 2013-07-06 DIAGNOSIS — I5043 Acute on chronic combined systolic (congestive) and diastolic (congestive) heart failure: Principal | ICD-10-CM | POA: Diagnosis present

## 2013-07-06 DIAGNOSIS — I509 Heart failure, unspecified: Secondary | ICD-10-CM | POA: Diagnosis present

## 2013-07-06 DIAGNOSIS — I10 Essential (primary) hypertension: Secondary | ICD-10-CM | POA: Diagnosis present

## 2013-07-06 DIAGNOSIS — I739 Peripheral vascular disease, unspecified: Secondary | ICD-10-CM | POA: Diagnosis present

## 2013-07-06 LAB — BASIC METABOLIC PANEL
BUN: 18 mg/dL (ref 6–23)
CALCIUM: 9.4 mg/dL (ref 8.4–10.5)
CHLORIDE: 103 meq/L (ref 96–112)
CO2: 22 meq/L (ref 19–32)
Creatinine, Ser: 1.11 mg/dL — ABNORMAL HIGH (ref 0.50–1.10)
GFR calc Af Amer: 63 mL/min — ABNORMAL LOW (ref >90)
GFR calc non Af Amer: 54 mL/min — ABNORMAL LOW (ref >90)
Glucose, Bld: 208 mg/dL — ABNORMAL HIGH (ref 70–99)
POTASSIUM: 4.2 meq/L (ref 3.7–5.3)
SODIUM: 138 meq/L (ref 137–147)

## 2013-07-06 LAB — CBC
HEMATOCRIT: 28.5 % — AB (ref 36.0–46.0)
HEMOGLOBIN: 9.1 g/dL — AB (ref 12.0–15.0)
MCH: 28.4 pg (ref 26.0–34.0)
MCHC: 31.9 g/dL (ref 30.0–36.0)
MCV: 89.1 fL (ref 78.0–100.0)
Platelets: 235 10*3/uL (ref 150–400)
RBC: 3.2 MIL/uL — AB (ref 3.87–5.11)
RDW: 15.5 % (ref 11.5–15.5)
WBC: 7.8 10*3/uL (ref 4.0–10.5)

## 2013-07-06 LAB — I-STAT TROPONIN, ED: Troponin i, poc: 0.01 ng/mL (ref 0.00–0.08)

## 2013-07-06 NOTE — ED Notes (Signed)
Pt placed on 2L O2 

## 2013-07-06 NOTE — ED Notes (Signed)
Pt c/o difficulty breathing, cp, coughing x 2 wks.  sts just getting worse.

## 2013-07-07 ENCOUNTER — Encounter (HOSPITAL_COMMUNITY): Payer: Self-pay | Admitting: *Deleted

## 2013-07-07 ENCOUNTER — Inpatient Hospital Stay (HOSPITAL_COMMUNITY)
Admission: EM | Admit: 2013-07-07 | Discharge: 2013-07-09 | DRG: 291 | Disposition: A | Discharge status: Home / Self Care | Payer: No Typology Code available for payment source | Attending: Internal Medicine | Admitting: Internal Medicine

## 2013-07-07 DIAGNOSIS — I160 Hypertensive urgency: Secondary | ICD-10-CM

## 2013-07-07 DIAGNOSIS — M1612 Unilateral primary osteoarthritis, left hip: Secondary | ICD-10-CM

## 2013-07-07 DIAGNOSIS — I739 Peripheral vascular disease, unspecified: Secondary | ICD-10-CM

## 2013-07-07 DIAGNOSIS — N189 Chronic kidney disease, unspecified: Secondary | ICD-10-CM

## 2013-07-07 DIAGNOSIS — I5042 Chronic combined systolic (congestive) and diastolic (congestive) heart failure: Secondary | ICD-10-CM | POA: Diagnosis present

## 2013-07-07 DIAGNOSIS — I5023 Acute on chronic systolic (congestive) heart failure: Secondary | ICD-10-CM

## 2013-07-07 DIAGNOSIS — I369 Nonrheumatic tricuspid valve disorder, unspecified: Secondary | ICD-10-CM

## 2013-07-07 DIAGNOSIS — I5041 Acute combined systolic (congestive) and diastolic (congestive) heart failure: Secondary | ICD-10-CM

## 2013-07-07 DIAGNOSIS — I509 Heart failure, unspecified: Secondary | ICD-10-CM

## 2013-07-07 DIAGNOSIS — R509 Fever, unspecified: Secondary | ICD-10-CM

## 2013-07-07 DIAGNOSIS — I10 Essential (primary) hypertension: Secondary | ICD-10-CM | POA: Diagnosis present

## 2013-07-07 DIAGNOSIS — L98499 Non-pressure chronic ulcer of skin of other sites with unspecified severity: Secondary | ICD-10-CM

## 2013-07-07 DIAGNOSIS — J189 Pneumonia, unspecified organism: Secondary | ICD-10-CM

## 2013-07-07 DIAGNOSIS — E119 Type 2 diabetes mellitus without complications: Secondary | ICD-10-CM | POA: Diagnosis present

## 2013-07-07 DIAGNOSIS — I119 Hypertensive heart disease without heart failure: Secondary | ICD-10-CM

## 2013-07-07 LAB — URINE MICROSCOPIC-ADD ON

## 2013-07-07 LAB — URINALYSIS, ROUTINE W REFLEX MICROSCOPIC
Bilirubin Urine: NEGATIVE
GLUCOSE, UA: NEGATIVE mg/dL
KETONES UR: NEGATIVE mg/dL
Nitrite: NEGATIVE
PH: 5 (ref 5.0–8.0)
PROTEIN: NEGATIVE mg/dL
Specific Gravity, Urine: 1.012 (ref 1.005–1.030)
Urobilinogen, UA: 0.2 mg/dL (ref 0.0–1.0)

## 2013-07-07 LAB — GLUCOSE, CAPILLARY
GLUCOSE-CAPILLARY: 251 mg/dL — AB (ref 70–99)
GLUCOSE-CAPILLARY: 132 mg/dL — AB (ref 70–99)
Glucose-Capillary: 135 mg/dL — ABNORMAL HIGH (ref 70–99)
Glucose-Capillary: 90 mg/dL (ref 70–99)

## 2013-07-07 LAB — HEMOGLOBIN A1C
HEMOGLOBIN A1C: 7.5 % — AB (ref <5.7)
Mean Plasma Glucose: 169 mg/dL — ABNORMAL HIGH (ref <117)

## 2013-07-07 LAB — PRO B NATRIURETIC PEPTIDE: Pro B Natriuretic peptide (BNP): 4184 pg/mL — ABNORMAL HIGH (ref 0–125)

## 2013-07-07 MED ORDER — FUROSEMIDE 10 MG/ML IJ SOLN
40.0000 mg | Freq: Once | INTRAMUSCULAR | Status: AC
  Administered 2013-07-07: 40 mg via INTRAVENOUS
  Filled 2013-07-07: qty 4

## 2013-07-07 MED ORDER — TRAMADOL HCL 50 MG PO TABS: 50.0000 mg | ORAL_TABLET | Freq: Four times a day (QID) | ORAL | Status: DC | PRN

## 2013-07-07 MED ORDER — PNEUMOCOCCAL VAC POLYVALENT 25 MCG/0.5ML IJ INJ
0.5000 mL | INJECTION | INTRAMUSCULAR | Status: AC
  Administered 2013-07-08: 0.5 mL via INTRAMUSCULAR
  Filled 2013-07-07 (×2): qty 0.5

## 2013-07-07 MED ORDER — SODIUM CHLORIDE 0.9 % IV SOLN
Freq: Once | INTRAVENOUS | Status: AC
  Administered 2013-07-07: 01:00:00 via INTRAVENOUS

## 2013-07-07 MED ORDER — IPRATROPIUM BROMIDE 0.02 % IN SOLN
0.5000 mg | Freq: Once | RESPIRATORY_TRACT | Status: AC
  Administered 2013-07-07: 0.5 mg via RESPIRATORY_TRACT
  Filled 2013-07-07: qty 2.5

## 2013-07-07 MED ORDER — CARVEDILOL 6.25 MG PO TABS
6.2500 mg | ORAL_TABLET | Freq: Two times a day (BID) | ORAL | Status: DC
  Administered 2013-07-07 – 2013-07-08 (×3): 6.25 mg via ORAL
  Filled 2013-07-07 (×5): qty 1

## 2013-07-07 MED ORDER — ONDANSETRON HCL 4 MG/2ML IJ SOLN: 4.0000 mg | Freq: Four times a day (QID) | INTRAMUSCULAR | Status: DC | PRN

## 2013-07-07 MED ORDER — DEXTROSE 5 % IV SOLN
4.0000 mg/h | INTRAVENOUS | Status: DC
  Administered 2013-07-07: 4 mg/h via INTRAVENOUS
  Filled 2013-07-07: qty 25

## 2013-07-07 MED ORDER — SODIUM CHLORIDE 0.9 % IJ SOLN: 3.0000 mL | Freq: Two times a day (BID) | INTRAMUSCULAR | Status: DC

## 2013-07-07 MED ORDER — ACETAMINOPHEN-CODEINE #3 300-30 MG PO TABS: 1.0000 | ORAL_TABLET | ORAL | Status: DC | PRN

## 2013-07-07 MED ORDER — SODIUM CHLORIDE 0.9 % IJ SOLN: 3.0000 mL | INTRAMUSCULAR | Status: DC | PRN

## 2013-07-07 MED ORDER — ASPIRIN EC 81 MG PO TBEC
81.0000 mg | DELAYED_RELEASE_TABLET | Freq: Every day | ORAL | Status: DC
  Administered 2013-07-07 – 2013-07-09 (×3): 81 mg via ORAL
  Filled 2013-07-07 (×3): qty 1

## 2013-07-07 MED ORDER — SPIRONOLACTONE 25 MG PO TABS
25.0000 mg | ORAL_TABLET | Freq: Every day | ORAL | Status: DC
  Administered 2013-07-07 – 2013-07-09 (×3): 25 mg via ORAL
  Filled 2013-07-07 (×3): qty 1

## 2013-07-07 MED ORDER — LISINOPRIL 10 MG PO TABS
10.0000 mg | ORAL_TABLET | Freq: Every day | ORAL | Status: DC
  Administered 2013-07-07 – 2013-07-08 (×2): 10 mg via ORAL
  Filled 2013-07-07 (×3): qty 1

## 2013-07-07 MED ORDER — GLIPIZIDE 5 MG PO TABS
5.0000 mg | ORAL_TABLET | Freq: Every day | ORAL | Status: DC
  Administered 2013-07-07 – 2013-07-09 (×3): 5 mg via ORAL
  Filled 2013-07-07 (×4): qty 1

## 2013-07-07 MED ORDER — ACETAMINOPHEN 325 MG PO TABS: 650.0000 mg | ORAL_TABLET | ORAL | Status: DC | PRN

## 2013-07-07 MED ORDER — SODIUM CHLORIDE 0.9 % IV SOLN: 250.0000 mL | INTRAVENOUS | Status: DC | PRN

## 2013-07-07 MED ORDER — INSULIN ASPART 100 UNIT/ML ~~LOC~~ SOLN
0.0000 [IU] | Freq: Three times a day (TID) | SUBCUTANEOUS | Status: DC
  Administered 2013-07-07: 2 [IU] via SUBCUTANEOUS
  Administered 2013-07-08 – 2013-07-09 (×2): 3 [IU] via SUBCUTANEOUS

## 2013-07-07 MED ORDER — FUROSEMIDE 10 MG/ML IJ SOLN
40.0000 mg | Freq: Two times a day (BID) | INTRAMUSCULAR | Status: DC
  Administered 2013-07-07: 40 mg via INTRAVENOUS
  Filled 2013-07-07 (×3): qty 4

## 2013-07-07 MED ORDER — ALBUTEROL SULFATE (2.5 MG/3ML) 0.083% IN NEBU
2.5000 mg | INHALATION_SOLUTION | Freq: Once | RESPIRATORY_TRACT | Status: AC
  Administered 2013-07-07: 2.5 mg via RESPIRATORY_TRACT
  Filled 2013-07-07: qty 3

## 2013-07-07 MED ORDER — ACETAMINOPHEN 325 MG PO TABS
650.0000 mg | ORAL_TABLET | Freq: Once | ORAL | Status: AC
  Administered 2013-07-07: 650 mg via ORAL
  Filled 2013-07-07: qty 2

## 2013-07-07 MED ORDER — INSULIN DETEMIR 100 UNIT/ML ~~LOC~~ SOLN
10.0000 [IU] | Freq: Every day | SUBCUTANEOUS | Status: DC
  Administered 2013-07-07 – 2013-07-08 (×2): 10 [IU] via SUBCUTANEOUS
  Filled 2013-07-07 (×3): qty 0.1

## 2013-07-07 MED ORDER — DEXTROSE 5 % IV SOLN
500.0000 mg | Freq: Every day | INTRAVENOUS | Status: DC
  Administered 2013-07-07: 500 mg via INTRAVENOUS
  Filled 2013-07-07 (×2): qty 500

## 2013-07-07 MED ORDER — DEXTROSE 5 % IV SOLN
1.0000 g | Freq: Every day | INTRAVENOUS | Status: DC
  Administered 2013-07-07: 1 g via INTRAVENOUS
  Filled 2013-07-07 (×2): qty 10

## 2013-07-07 MED ORDER — HEPARIN SODIUM (PORCINE) 5000 UNIT/ML IJ SOLN
5000.0000 [IU] | Freq: Three times a day (TID) | INTRAMUSCULAR | Status: DC
  Administered 2013-07-07 – 2013-07-09 (×7): 5000 [IU] via SUBCUTANEOUS
  Filled 2013-07-07 (×10): qty 1

## 2013-07-07 MED ORDER — DEXTROSE 5 % IV SOLN
1.0000 g | Freq: Once | INTRAVENOUS | Status: AC
  Administered 2013-07-07: 1 g via INTRAVENOUS
  Filled 2013-07-07: qty 10

## 2013-07-07 MED ORDER — METFORMIN HCL 500 MG PO TABS
500.0000 mg | ORAL_TABLET | Freq: Two times a day (BID) | ORAL | Status: DC
  Administered 2013-07-07: 500 mg via ORAL
  Filled 2013-07-07 (×3): qty 1

## 2013-07-07 MED ORDER — NITROGLYCERIN IN D5W 200-5 MCG/ML-% IV SOLN: 5.0000 ug/min | INTRAVENOUS | Status: DC

## 2013-07-07 MED ORDER — INSULIN ASPART 100 UNIT/ML ~~LOC~~ SOLN
3.0000 [IU] | Freq: Three times a day (TID) | SUBCUTANEOUS | Status: DC
  Administered 2013-07-07 – 2013-07-09 (×3): 3 [IU] via SUBCUTANEOUS

## 2013-07-07 MED ORDER — INFLUENZA VAC SPLIT QUAD 0.5 ML IM SUSP
0.5000 mL | INTRAMUSCULAR | Status: AC
  Administered 2013-07-08: 0.5 mL via INTRAMUSCULAR
  Filled 2013-07-07: qty 0.5

## 2013-07-07 MED ORDER — DEXTROSE 5 % IV SOLN
500.0000 mg | Freq: Once | INTRAVENOUS | Status: DC
  Administered 2013-07-07: 500 mg via INTRAVENOUS

## 2013-07-07 MED ORDER — LABETALOL HCL 5 MG/ML IV SOLN
20.0000 mg | Freq: Once | INTRAVENOUS | Status: AC
  Administered 2013-07-07: 20 mg via INTRAVENOUS
  Filled 2013-07-07: qty 4

## 2013-07-07 MED ORDER — INSULIN ASPART 100 UNIT/ML ~~LOC~~ SOLN
0.0000 [IU] | Freq: Every day | SUBCUTANEOUS | Status: DC
  Administered 2013-07-08: 3 [IU] via SUBCUTANEOUS

## 2013-07-07 NOTE — H&P (Signed)
[facility] History and Physical  [name] [id number] DOB: October 03, 1955 DOA: 07/07/2013  Referring physician: EDP PCP: Irwin Brakeman, MD   Chief Complaint: SOB   HPI: [name] is a 58 y.o. female who presents to the ED with 2 week history of worsening cough and SOB.  Symptoms have been associated with worsening peripheral edema as well patient states.  Patient has a history of CHF with admissions for this in the past.  Last echo was late last year with slightly reduced EF.  In ED her BP was quite high initially at 197/96, this improved after labetalol.  Her SOB improved after lasix and O2 therapy.  She was noted to be febrile to 102.5, but   Review of Systems: Systems reviewed.  As above, otherwise negative  Past Medical History  Diagnosis Date  . Hypertension   . Diabetes mellitus   . Arthritis   . PVD (peripheral vascular disease)    Past Surgical History  Procedure Laterality Date  . Abdominal angiogram  12/28/2012   Social History:  reports that she has never smoked. She has never used smokeless tobacco. She reports that she does not drink alcohol or use illicit drugs.  Allergies  Allergen Reactions  . Plavix [Clopidogrel Bisulfate] Rash    Family History  Problem Relation Age of Onset  . Diabetes Mother      Prior to Admission medications   Medication Sig Start Date End Date Taking? Authorizing Provider  acetaminophen-codeine (TYLENOL #3) 300-30 MG per tablet Take 1 tablet by mouth every 4 (four) hours as needed for pain. 02/22/13   Angelica Chessman, MD  aspirin 81 MG tablet Take 81 mg by mouth daily.    Historical Provider, MD  carvedilol (COREG) 6.25 MG tablet Take 1 tablet (6.25 mg total) by mouth 2 (two) times daily with a meal. 02/22/13   Angelica Chessman, MD  glipiZIDE (GLUCOTROL) 5 MG tablet Take 5 mg by mouth daily.    Historical Provider, MD  metFORMIN (GLUCOPHAGE) 500 MG tablet Take 500 mg by mouth 2 (two) times daily with a meal.      Historical Provider, MD  OVER THE COUNTER MEDICATION Apply 1 drop to eye daily as needed. Eye drops for redness    Historical Provider, MD  spironolactone (ALDACTONE) 25 MG tablet Take 1 tablet (25 mg total) by mouth daily. 10/30/12   Shari A Upstill, PA-C  traMADol (ULTRAM) 50 MG tablet Take 50 mg by mouth every 6 (six) hours as needed for pain.    Historical Provider, MD   Physical Exam: Filed Vitals:   07/07/13 0314  BP: 170/77  Pulse: 85  Temp: 99 F (37.2 C)  Resp: 18    BP 170/77  Pulse 85  Temp(Src) 99 F (37.2 C) (Oral)  Resp 18  Ht 5\' 3"  (1.6 m)  Wt 80.2 kg (176 lb 12.9 oz)  BMI 31.33 kg/m2  SpO2 96%  General Appearance:    Alert, oriented, no distress, appears stated age  Head:    Normocephalic, atraumatic  Eyes:    PERRL, EOMI, sclera non-icteric        Nose:   Nares without drainage or epistaxis. Mucosa, turbinates normal  Throat:   Moist mucous membranes. Oropharynx without erythema or exudate.  Neck:   Supple. No carotid bruits.  No thyromegaly.  No lymphadenopathy.   Back:     No CVA tenderness, no spinal tenderness  Lungs:     b rhonchi  Chest wall:  No tenderness to palpitation  Heart:    Regular rate and rhythm without murmurs, gallops, rubs  Abdomen:     Soft, non-tender, nondistended, normal bowel sounds, no organomegaly  Genitalia:    deferred  Rectal:    deferred  Extremities:   No clubbing, cyanosis or edema.  Pulses:   2+ and symmetric all extremities  Skin:   Skin color, texture, turgor normal, no rashes or lesions  Lymph nodes:   Cervical, supraclavicular, and axillary nodes normal  Neurologic:   CNII-XII intact. Normal strength, sensation and reflexes      throughout    Labs on Admission:  Basic Metabolic Panel:  Recent Labs Lab 07/06/13 2205  NA 138  K 4.2  CL 103  CO2 22  GLUCOSE 208*  BUN 18  CREATININE 1.11*  CALCIUM 9.4   Liver Function Tests: No results found for this basename: AST, ALT, ALKPHOS, BILITOT, PROT, ALBUMIN,   in the last 168 hours No results found for this basename: LIPASE, AMYLASE,  in the last 168 hours No results found for this basename: AMMONIA,  in the last 168 hours CBC:  Recent Labs Lab 07/06/13 2205  WBC 7.8  HGB 9.1*  HCT 28.5*  MCV 89.1  PLT 235   Cardiac Enzymes: No results found for this basename: CKTOTAL, CKMB, CKMBINDEX, TROPONINI,  in the last 168 hours  BNP (last 3 results)  Recent Labs  10/30/12 1207 07/07/13 0008  PROBNP 2655.0* 4184.0*   CBG:  Recent Labs Lab 07/07/13 0230  GLUCAP 251*    Radiological Exams on Admission: Dg Chest 2 View (if Patient Has Fever And/or Copd)  07/06/2013   CLINICAL DATA:  Cough with shortness of breath for 2 weeks.  EXAM: CHEST  2 VIEW  COMPARISON:  NM MYOCAR MULTI w/SPECT w/WALL MOTION / EF dated 05/17/2013; DG CHEST 2 VIEW dated 10/30/2012  FINDINGS: Cardiomegaly. Bilateral pulmonary opacities associated with small effusions and fluid in the fissures consistent with congestive heart failure. No pneumothorax. Negative osseous structures. Worsening aeration from priors.  IMPRESSION: Cardiomegaly with early CHF.  Bilateral pneumonia is less favored.   Electronically Signed   By: [name] M.D.   On: 07/06/2013 23:00    EKG: Independently reviewed.  Assessment/Plan Principal Problem:   Acute on chronic systolic CHF (congestive heart failure) Active Problems:   DM2 (diabetes mellitus, type 2)   HTN (hypertension)   Fever, unspecified   1. Acute on chronic systolic CHF - patient improved with BP control and lasix, continue lasix BID, on heart failure pathway.  Given that her SBP is nearly 200 and she has worsening peripheral edema as well as the radiologists read of the X ray, do believe that the patient's primary cause of SOB is CHF. 2. Fever - Because we cannot rule out PNA completely however and the patient has an impressive fever of 102.5.  Have started her on CAP coverage for now.  Sputum cultures ordered.  UA is also  pending at this time. 3. DM2 - continue home PO meds 4. HTN - continue home meds, used 1 dose of labetalol to bring it under control this evening, add PRN labetalol or hydralazine if needed.    Code Status: Full Code  Family Communication: Family is sleeping at bedside right now Disposition Plan: Admit to inpatient   Time spent: 70 min  Thanvi Blincoe M. [facility] Pager [phone]  If 7AM-7PM, please contact the day team taking care of the patient [email or web address] Password [facility] 07/07/2013,  4:18 AM

## 2013-07-07 NOTE — Progress Notes (Signed)
Echo Lab  2D Echocardiogram completed.  [name], RDCS 07/07/2013 11:14 AM

## 2013-07-07 NOTE — Progress Notes (Signed)
[facility] PROGRESS NOTE Interim History: 58 y.o. female who presents to the ED with 2 week history of worsening cough and SOB. Symptoms have been associated with worsening peripheral edema as well patient states  [facility] Weights   07/07/13 0314 07/07/13 0417  Weight: 80.2 kg (176 lb 12.9 oz) 80.2 kg (176 lb 12.9 oz)        Intake/Output Summary (Last 24 hours) at 07/07/13 1015 Last data filed at 07/07/13 [id number]  Gross per 24 hour  Intake    240 ml  Output   1200 ml  Net   -960 ml     Assessment/Plan: Acute respiratory failure due to Acute on chronic systolic CHF (congestive heart failure) and CAP: - start lasix ggt, cont spironolactone.  - monitor electrolytes, strict I and O's low sodium diet. - cont rocephin and azithro started on 3.7.2015. - cultures pending.  HTN (hypertension) - high due to vol overload. - cont coreg, start ACE-I. - cont spironolactone.  DM2 (diabetes mellitus, type 2) - d/c metformin start lantus Plus SSI.    Code Status: Full Code  Family Communication: Family is sleeping at bedside right now  Disposition Plan: Admit to inpatient     Consultants:  none  Procedures: ECHO: pending  Antibiotics:  Rocephin and azithro 3.8.2015  HPI/Subjective: complaining of cough and SOB.  Objective: Filed Vitals:   07/07/13 0129 07/07/13 0149 07/07/13 0314 07/07/13 0417  BP: 166/86 126/102 170/77   Pulse: 86 86 85   Temp:   99 F (37.2 C)   TempSrc:   Oral   Resp: 16  18   Height:   5\' 3"  (1.6 m)   Weight:   80.2 kg (176 lb 12.9 oz) 80.2 kg (176 lb 12.9 oz)  SpO2: 98% 95% 96%      Exam:  General: Alert, awake, oriented x3, in no acute distress.  HEENT: No bruits, no goiter. +JVD Heart: Regular rate and rhythm, 1+ lower extremity edema. Lungs: Good air movement, crackles diffuse Abdomen: Soft, nontender, nondistended, positive bowel sounds.    Data Reviewed: Basic Metabolic Panel:  Recent Labs Lab 07/06/13 2205  NA 138    K 4.2  CL 103  CO2 22  GLUCOSE 208*  BUN 18  CREATININE 1.11*  CALCIUM 9.4   Liver Function Tests: No results found for this basename: AST, ALT, ALKPHOS, BILITOT, PROT, ALBUMIN,  in the last 168 hours No results found for this basename: LIPASE, AMYLASE,  in the last 168 hours No results found for this basename: AMMONIA,  in the last 168 hours CBC:  Recent Labs Lab 07/06/13 2205  WBC 7.8  HGB 9.1*  HCT 28.5*  MCV 89.1  PLT 235   Cardiac Enzymes: No results found for this basename: CKTOTAL, CKMB, CKMBINDEX, TROPONINI,  in the last 168 hours BNP (last 3 results)  Recent Labs  10/30/12 1207 07/07/13 0008  PROBNP 2655.0* 4184.0*   CBG:  Recent Labs Lab 07/07/13 0230  GLUCAP 251*    No results found for this or any previous visit (from the past 240 hour(s)).   Studies: Dg Chest 2 View (if Patient Has Fever And/or Copd)  07/06/2013   CLINICAL DATA:  Cough with shortness of breath for 2 weeks.  EXAM: CHEST  2 VIEW  COMPARISON:  NM MYOCAR MULTI w/SPECT w/WALL MOTION / EF dated 05/17/2013; DG CHEST 2 VIEW dated 10/30/2012  FINDINGS: Cardiomegaly. Bilateral pulmonary opacities associated with small effusions and fluid in the fissures consistent with congestive  heart failure. No pneumothorax. Negative osseous structures. Worsening aeration from priors.  IMPRESSION: Cardiomegaly with early CHF.  Bilateral pneumonia is less favored.   Electronically Signed   By: Rolla Flatten M.D.   On: 07/06/2013 23:00    Scheduled Meds: . aspirin EC  81 mg Oral Daily  . azithromycin (ZITHROMAX) 500 MG IVPB  500 mg Intravenous Once  . azithromycin  500 mg Intravenous QHS  . carvedilol  6.25 mg Oral BID WC  . cefTRIAXone (ROCEPHIN)  IV  1 g Intravenous QHS  . furosemide  40 mg Intravenous BID  . glipiZIDE  5 mg Oral Q breakfast  . heparin  5,000 Units Subcutaneous 3 times per day  . [START ON 07/08/2013] influenza vac split quadrivalent PF  0.5 mL Intramuscular Tomorrow-1000  . metFORMIN  500  mg Oral BID WC  . [START ON 07/08/2013] pneumococcal 23 valent vaccine  0.5 mL Intramuscular Tomorrow-1000  . sodium chloride  3 mL Intravenous Q12H  . spironolactone  25 mg Oral Daily   Continuous Infusions:    Charlynne Cousins  [facility] Pager [phone] If 8PM-8AM, please contact night-coverage at [email or web address], password [facility] 07/07/2013, 10:15 AM  LOS: 0 days

## 2013-07-07 NOTE — ED Notes (Signed)
Care handoff was at 0150, but due to time change timestamp is an hour ahead.

## 2013-07-07 NOTE — ED Notes (Signed)
RN verified students assessment.

## 2013-07-07 NOTE — ED Provider Notes (Signed)
CSN: [id number]     Arrival date & time 07/06/13  2125 History   First MD Initiated Contact with Patient 07/07/13 0021     Chief Complaint  Patient presents with  . Shortness of Breath  . Cough     (Consider location/radiation/quality/duration/timing/severity/associated sxs/prior Treatment) HPI 58 year old female presents to emergency room from home with complaint of shortness of breath.  She reports that she has had dyspnea on exertion, worsening cough over the last 2 weeks.  Tonight she is noted to have fever of 102.  She is unsure if she has had fever up to this day.  Patient has history of hypertension, diabetes, and peripheral vascular disease.  She, reports she's taking her medications as prescribed.  She reports that she has been told by Dr. Terrence Dupont that she has congestive heart failure.  She is on Aldactone for this.  She denies any chest pain.  Cough is nonproductive.  No sick contacts.  She reports she's has increasing swelling in her legs and abdomen over the last 2 weeks.  She has some mild orthopnea. Past Medical History  Diagnosis Date  . Hypertension   . Diabetes mellitus   . Arthritis   . PVD (peripheral vascular disease)    Past Surgical History  Procedure Laterality Date  . Abdominal angiogram  12/28/2012   Family History  Problem Relation Age of Onset  . Diabetes Mother    History  Substance Use Topics  . Smoking status: Never Smoker   . Smokeless tobacco: Never Used  . Alcohol Use: No     Comment: ocassionally   OB History   Grav Para Term Preterm Abortions TAB SAB Ect Mult Living                 Review of Systems   See History of Present Illness; otherwise all other systems are reviewed and negative  Allergies  Plavix  Home Medications   Current Outpatient Rx  Name  Route  Sig  Dispense  Refill  . acetaminophen-codeine (TYLENOL #3) 300-30 MG per tablet   Oral   Take 1 tablet by mouth every 4 (four) hours as needed for pain.   30 tablet   0    . aspirin 81 MG tablet   Oral   Take 81 mg by mouth daily.         . carvedilol (COREG) 6.25 MG tablet   Oral   Take 1 tablet (6.25 mg total) by mouth 2 (two) times daily with a meal.   60 tablet   3   . glipiZIDE (GLUCOTROL) 5 MG tablet   Oral   Take 5 mg by mouth daily.         . metFORMIN (GLUCOPHAGE) 500 MG tablet   Oral   Take 500 mg by mouth 2 (two) times daily with a meal.          . OVER THE COUNTER MEDICATION   Ophthalmic   Apply 1 drop to eye daily as needed. Eye drops for redness         . spironolactone (ALDACTONE) 25 MG tablet   Oral   Take 1 tablet (25 mg total) by mouth daily.   14 tablet   0   . traMADol (ULTRAM) 50 MG tablet   Oral   Take 50 mg by mouth every 6 (six) hours as needed for pain.          BP 197/96  Pulse 87  Temp(Src) 100.4 F (38  C) (Oral)  Resp 22  SpO2 99% Physical Exam  Nursing note and vitals reviewed. Constitutional: She is oriented to person, place, and time. She appears well-developed and well-nourished.  HENT:  Head: Normocephalic and atraumatic.  Nose: Nose normal.  Mouth/Throat: Oropharynx is clear and moist.  Eyes: Conjunctivae and EOM are normal. Pupils are equal, round, and reactive to light.  Neck: Normal range of motion. Neck supple. No JVD present. No tracheal deviation present. No thyromegaly present.  Cardiovascular: Normal rate, regular rhythm, normal heart sounds and intact distal pulses.  Exam reveals no gallop and no friction rub.   No murmur heard. Pulmonary/Chest: Effort normal. No stridor. No respiratory distress. She has wheezes. She has rales. She exhibits no tenderness.  Abdominal: Soft. Bowel sounds are normal. She exhibits no distension and no mass. There is no tenderness. There is no rebound and no guarding.  Musculoskeletal: Normal range of motion. She exhibits edema (1+ to shin). She exhibits no tenderness.  Lymphadenopathy:    She has no cervical adenopathy.  Neurological: She is alert  and oriented to person, place, and time. She exhibits normal muscle tone. Coordination normal.  Skin: Skin is warm and dry. No rash noted. No erythema. No pallor.  Psychiatric: She has a normal mood and affect. Her behavior is normal. Judgment and thought content normal.    ED Course  Procedures (including critical care time) Labs Review Labs Reviewed  CBC - Abnormal; Notable for the following:    RBC 3.20 (*)    Hemoglobin 9.1 (*)    HCT 28.5 (*)    All other components within normal limits  BASIC METABOLIC PANEL - Abnormal; Notable for the following:    Glucose, Bld 208 (*)    Creatinine, Ser 1.11 (*)    GFR calc non Af Amer 54 (*)    GFR calc Af Amer 63 (*)    All other components within normal limits  PRO B NATRIURETIC PEPTIDE - Abnormal; Notable for the following:    Pro B Natriuretic peptide (BNP) 4184.0 (*)    All other components within normal limits  URINALYSIS, ROUTINE W REFLEX MICROSCOPIC  I-STAT TROPOININ, ED   Imaging Review Dg Chest 2 View (if Patient Has Fever And/or Copd)  07/06/2013   CLINICAL DATA:  Cough with shortness of breath for 2 weeks.  EXAM: CHEST  2 VIEW  COMPARISON:  NM MYOCAR MULTI w/SPECT w/WALL MOTION / EF dated 05/17/2013; DG CHEST 2 VIEW dated 10/30/2012  FINDINGS: Cardiomegaly. Bilateral pulmonary opacities associated with small effusions and fluid in the fissures consistent with congestive heart failure. No pneumothorax. Negative osseous structures. Worsening aeration from priors.  IMPRESSION: Cardiomegaly with early CHF.  Bilateral pneumonia is less favored.   Electronically Signed   By: Rolla Flatten M.D.   On: 07/06/2013 23:00     EKG Interpretation   Date/Time:  Saturday July 06 2013 21:35:17 EST Ventricular Rate:  88 PR Interval:  154 QRS Duration: 98 QT Interval:  366 QTC Calculation: 442 R Axis:   32 Text Interpretation:  Sinus rhythm with Premature atrial complexes Left  ventricular hypertrophy with repolarization abnormality Abnormal  ECG Since  last tracing rate slower Confirmed by Baily Hovanec  MD, Emanuele Mcwhirter ([id number]) on 07/07/2013  12:43:54 AM      MDM   Final diagnoses:  CAP (community acquired pneumonia)  CHF (congestive heart failure)  Hypertensive urgency    58 year old female with worsening shortness of breath and cough.  Chest x-ray suggestive of congestive heart  failure, but patient also with fever of 102, and may have atypical presentation of bilateral pneumonia.  BNP is elevated.  She is hypertensive.  Will treat both possible causes, getting antibiotics for an underlying infection and also start Lasix and give labetalol for her hypertensive urgency.  Case discussed with hospitalist who will admit    Kalman Drape, MD 07/07/13 [phone]

## 2013-07-07 NOTE — Progress Notes (Signed)
See nursing notes.

## 2013-07-08 DIAGNOSIS — N189 Chronic kidney disease, unspecified: Secondary | ICD-10-CM

## 2013-07-08 LAB — GLUCOSE, CAPILLARY
GLUCOSE-CAPILLARY: 114 mg/dL — AB (ref 70–99)
GLUCOSE-CAPILLARY: 98 mg/dL (ref 70–99)
Glucose-Capillary: 173 mg/dL — ABNORMAL HIGH (ref 70–99)
Glucose-Capillary: 255 mg/dL — ABNORMAL HIGH (ref 70–99)

## 2013-07-08 LAB — BASIC METABOLIC PANEL
BUN: 19 mg/dL (ref 6–23)
CHLORIDE: 98 meq/L (ref 96–112)
CO2: 28 meq/L (ref 19–32)
CREATININE: 1.32 mg/dL — AB (ref 0.50–1.10)
Calcium: 9 mg/dL (ref 8.4–10.5)
GFR calc Af Amer: 51 mL/min — ABNORMAL LOW (ref >90)
GFR calc non Af Amer: 44 mL/min — ABNORMAL LOW (ref >90)
GLUCOSE: 111 mg/dL — AB (ref 70–99)
POTASSIUM: 3.2 meq/L — AB (ref 3.7–5.3)
Sodium: 142 mEq/L (ref 137–147)

## 2013-07-08 MED ORDER — CARVEDILOL 12.5 MG PO TABS
12.5000 mg | ORAL_TABLET | Freq: Two times a day (BID) | ORAL | Status: DC
  Administered 2013-07-08 – 2013-07-09 (×2): 12.5 mg via ORAL
  Filled 2013-07-08 (×4): qty 1

## 2013-07-08 MED ORDER — LEVOFLOXACIN 750 MG PO TABS
750.0000 mg | ORAL_TABLET | Freq: Every day | ORAL | Status: DC
Start: 2013-07-08 — End: 2013-07-09
  Administered 2013-07-08: 750 mg via ORAL
  Filled 2013-07-08 (×2): qty 1

## 2013-07-08 MED ORDER — FUROSEMIDE 40 MG PO TABS: 40.0000 mg | ORAL_TABLET | Freq: Every day | ORAL | Status: DC

## 2013-07-08 MED ORDER — FUROSEMIDE 20 MG PO TABS
20.0000 mg | ORAL_TABLET | Freq: Every day | ORAL | Status: DC
  Administered 2013-07-08: 20 mg via ORAL
  Filled 2013-07-08 (×2): qty 1

## 2013-07-08 MED ORDER — POTASSIUM CHLORIDE CRYS ER 20 MEQ PO TBCR
40.0000 meq | EXTENDED_RELEASE_TABLET | Freq: Two times a day (BID) | ORAL | Status: AC
  Administered 2013-07-08 (×2): 40 meq via ORAL
  Filled 2013-07-08 (×2): qty 2

## 2013-07-08 NOTE — Progress Notes (Signed)
Pt continues on IV Lasix drip with large amount of U.O., tolerating well, VSS, NAD noted. CHF education given and patient needs more education about CHF, remains Alert and oriented times 4, able to communicate needs, daughter at bedside, will continue to monitor

## 2013-07-08 NOTE — Progress Notes (Signed)
Pt a/o, no c/o pain, pt watched heart failure video, education done on fluid intake and sodium, lasix gtt d/c'd no on po lasix, Foley d/c'd and tele d/c'd, pt stable

## 2013-07-08 NOTE — Progress Notes (Signed)
[facility] PROGRESS NOTE Interim History: 58 y.o. female who presents to the ED with 2 week history of worsening cough and SOB. Symptoms have been associated with worsening peripheral edema as well patient states  Filed Weights   07/07/13 0417 07/08/13 0551 07/08/13 0558  Weight: 80.2 kg (176 lb 12.9 oz) 78.68 kg (173 lb 7.3 oz) 78.26 kg (172 lb 8.5 oz)        Intake/Output Summary (Last 24 hours) at 07/08/13 0951 Last data filed at 07/08/13 [id number]  Gross per 24 hour  Intake 1352.13 ml  Output   3425 ml  Net -2072.87 ml     Assessment/Plan: Acute respiratory failure due to Acute on chronic systolic CHF (congestive heart failure) and CAP: - change to oral alsix, cont spironolactone.  - monitor electrolytes as needed, strict I and O's low sodium diet. - cont rocephin and azithro started on 3.7.2015. Deescalate to levaquin. - cultures negative. D/c foley and telemtry.  HTN (hypertension) - high due to vol overload. - cont coreg, start ACE-I. - cont spironolactone.  DM2 (diabetes mellitus, type 2) - d/c metformin start lantus Plus SSI.    Code Status: Full Code  Family Communication: Family is sleeping at bedside right now  Disposition Plan: Admit to inpatient     Consultants:  none  Procedures: ECHO: pending  Antibiotics:  Rocephin and azithro 3.8.2015  HPI/Subjective: SOB improved.  Objective: Filed Vitals:   07/07/13 1940 07/08/13 0551 07/08/13 0558 07/08/13 0945  BP: 156/75 167/75 143/88 134/64  Pulse: 90 84 78 81  Temp: 99.6 F (37.6 C) 98.6 F (37 C) 97.3 F (36.3 C)   TempSrc: Oral Oral Oral   Resp: 18 18 18    Height:      Weight:  78.68 kg (173 lb 7.3 oz) 78.26 kg (172 lb 8.5 oz)   SpO2: 96% 97% 94% 97%     Exam:  General: Alert, awake, oriented x3, in no acute distress.  HEENT: No bruits, no goiter. - JVD Heart: Regular rate and rhythm. Lungs: Good air movement, clear. Abdomen: Soft, nontender, nondistended, positive bowel  sounds.    Data Reviewed: Basic Metabolic Panel:  Recent Labs Lab 07/06/13 2205 07/08/13 0536  NA 138 142  K 4.2 3.2*  CL 103 98  CO2 22 28  GLUCOSE 208* 111*  BUN 18 19  CREATININE 1.11* 1.32*  CALCIUM 9.4 9.0   Liver Function Tests: No results found for this basename: AST, ALT, ALKPHOS, BILITOT, PROT, ALBUMIN,  in the last 168 hours No results found for this basename: LIPASE, AMYLASE,  in the last 168 hours No results found for this basename: AMMONIA,  in the last 168 hours CBC:  Recent Labs Lab 07/06/13 2205  WBC 7.8  HGB 9.1*  HCT 28.5*  MCV 89.1  PLT 235   Cardiac Enzymes: No results found for this basename: CKTOTAL, CKMB, CKMBINDEX, TROPONINI,  in the last 168 hours BNP (last 3 results)  Recent Labs  10/30/12 1207 07/07/13 0008  PROBNP 2655.0* 4184.0*   CBG:  Recent Labs Lab 07/07/13 0230 07/07/13 1214 07/07/13 1603 07/07/13 2116 07/08/13 0556  GLUCAP 251* 135* 90 132* 114*    No results found for this or any previous visit (from the past 240 hour(s)).   Studies: Dg Chest 2 View (if Patient Has Fever And/or Copd)  07/06/2013   CLINICAL DATA:  Cough with shortness of breath for 2 weeks.  EXAM: CHEST  2 VIEW  COMPARISON:  NM MYOCAR MULTI w/SPECT  w/WALL MOTION / EF dated 05/17/2013; DG CHEST 2 VIEW dated 10/30/2012  FINDINGS: Cardiomegaly. Bilateral pulmonary opacities associated with small effusions and fluid in the fissures consistent with congestive heart failure. No pneumothorax. Negative osseous structures. Worsening aeration from priors.  IMPRESSION: Cardiomegaly with early CHF.  Bilateral pneumonia is less favored.   Electronically Signed   By: Rolla Flatten M.D.   On: 07/06/2013 23:00    Scheduled Meds: . aspirin EC  81 mg Oral Daily  . azithromycin (ZITHROMAX) 500 MG IVPB  500 mg Intravenous Once  . azithromycin  500 mg Intravenous QHS  . carvedilol  6.25 mg Oral BID WC  . cefTRIAXone (ROCEPHIN)  IV  1 g Intravenous QHS  . glipiZIDE  5 mg  Oral Q breakfast  . heparin  5,000 Units Subcutaneous 3 times per day  . influenza vac split quadrivalent PF  0.5 mL Intramuscular Tomorrow-1000  . insulin aspart  0-15 Units Subcutaneous TID WC  . insulin aspart  0-5 Units Subcutaneous QHS  . insulin aspart  3 Units Subcutaneous TID WC  . insulin detemir  10 Units Subcutaneous QHS  . lisinopril  10 mg Oral Daily  . pneumococcal 23 valent vaccine  0.5 mL Intramuscular Tomorrow-1000  . potassium chloride  40 mEq Oral BID  . spironolactone  25 mg Oral Daily   Continuous Infusions: . furosemide (LASIX) infusion 4 mg/hr (07/07/13 1158)     Charlynne Cousins  [facility] Pager [phone] If 8PM-8AM, please contact night-coverage at [email or web address], password [facility] 07/08/2013, 9:51 AM  LOS: 1 day

## 2013-07-08 NOTE — Care Management Note (Signed)
    Page 1 of 1   07/08/2013     2:08:17 PM   CARE MANAGEMENT NOTE 07/08/2013  Patient:  [name], [name]   Account Number:  [id number]  Date Initiated:  07/08/2013  Documentation initiated by:  [facility]  Subjective/Objective Assessment:   58 y.o. female who presents to the ED with 2 week history of worsening cough and SOB.  Symptoms have been associated with worsening peripheral edema as well patient states. //Home with spouse     Action/Plan:   Lasix gtts//Offer HH services   Anticipated DC Date:  07/11/2013   Anticipated DC Plan:  [place]  CM consult      Choice offered to / List presented to:             Status of service:  In process, will continue to follow Medicare Important Message given?   (If response is "NO", the following Medicare IM given date fields will be blank) Date Medicare IM given:   Date Additional Medicare IM given:    Discharge Disposition:    Per UR Regulation:    If discussed at Long Length of Stay Meetings, dates discussed:    Comments:

## 2013-07-08 NOTE — Progress Notes (Signed)
Pt c/o tingling in left leg, Dr. Venetia Constable notified, pt up ambulating with family with walker, pt stable

## 2013-07-09 DIAGNOSIS — I5041 Acute combined systolic (congestive) and diastolic (congestive) heart failure: Secondary | ICD-10-CM

## 2013-07-09 LAB — GLUCOSE, CAPILLARY
GLUCOSE-CAPILLARY: 190 mg/dL — AB (ref 70–99)
GLUCOSE-CAPILLARY: 107 mg/dL — AB (ref 70–99)

## 2013-07-09 LAB — BASIC METABOLIC PANEL
BUN: 31 mg/dL — AB (ref 6–23)
CO2: 24 mEq/L (ref 19–32)
CREATININE: 1.75 mg/dL — AB (ref 0.50–1.10)
Calcium: 9 mg/dL (ref 8.4–10.5)
Chloride: 100 mEq/L (ref 96–112)
GFR calc Af Amer: 36 mL/min — ABNORMAL LOW (ref >90)
GFR, EST NON AFRICAN AMERICAN: 31 mL/min — AB (ref >90)
Glucose, Bld: 240 mg/dL — ABNORMAL HIGH (ref 70–99)
POTASSIUM: 4.4 meq/L (ref 3.7–5.3)
Sodium: 140 mEq/L (ref 137–147)

## 2013-07-09 MED ORDER — LEVOFLOXACIN 750 MG PO TABS: 750.0000 mg | ORAL_TABLET | ORAL | Status: DC

## 2013-07-09 MED ORDER — LISINOPRIL 10 MG PO TABS: 10.0000 mg | ORAL_TABLET | Freq: Every day | ORAL | Status: DC

## 2013-07-09 NOTE — Progress Notes (Signed)
Pt being dc to home with family, pt given dc instructions, pt verbalized understanding, pt given educational material on diabetic diet and heart failure, pt leaving via wheelchair with family, pt stable

## 2013-07-09 NOTE — Plan of Care (Signed)
Problem: Food- and Nutrition-Related Knowledge Deficit (NB-1.1) Goal: Nutrition education Formal process to instruct or train a patient/client in a skill or to impart knowledge to help patients/clients voluntarily manage or modify food choices and eating behavior to maintain or improve health. Outcome: Completed/Met Date Met:  07/09/13  RD consulted for nutrition education regarding diabetes and CHF.  Per pt she was not following any diet guidelines PTA, but is ready to make changes to her diet. Pt plans to attend education classes at [facility] after discharge.     Lab Results  Component Value Date    HGBA1C 7.5* 07/07/2013    RD provided "Carbohydrate Counting for People with Diabetes" handout from the Academy of Nutrition and Dietetics. Discussed different food groups and their effects on blood sugar, emphasizing carbohydrate-containing foods. Provided list of carbohydrates and recommended serving sizes of common foods.  Discussed importance of controlled and consistent carbohydrate intake throughout the day. Provided examples of ways to balance meals/snacks and encouraged intake of high-fiber, whole grain complex carbohydrates. Teach back method used.    RD provided "Low Sodium Nutrition Therapy" handout from the Academy of Nutrition and Dietetics. Reviewed patient's dietary recall. Provided examples on ways to decrease sodium intake in diet. Discouraged intake of processed foods and use of salt shaker. Encouraged fresh fruits and vegetables as well as whole grain sources of carbohydrates to maximize fiber intake.   RD discussed why it is important for patient to adhere to diet recommendations, and emphasized the role of fluids, foods to avoid, and importance of weighing self daily.   Teach back method used.   Expect good compliance.  Body mass index is 29.02 kg/(m^2). Pt meets criteria for overweight based on current BMI.   Current diet order is Heart Health/CHO Modified, patient is  consuming approximately 75% of meals at this time. Labs and medications reviewed. No further nutrition interventions warranted at this time. RD contact information provided. If additional nutrition issues arise, please re-consult RD.  [place], [place], [place] Pager [phone] After Hours Pager

## 2013-07-09 NOTE — Progress Notes (Signed)
Inpatient Diabetes Program Recommendations  AACE/ADA: New Consensus Statement on Inpatient Glycemic Control (2013)  Target Ranges:  Prepandial:   less than 140 mg/dL      Peak postprandial:   less than 180 mg/dL (1-2 hours)      Critically ill patients:  140 - 180 mg/dL   Reason for Visit: Consult received from MD for Outpatient Diabetes Education  Diabetes history: Type 2 diabetes Outpatient Diabetes medications: Metformin Current orders for Inpatient glycemic control: Levemir 10 units, Meal coverage 3 units tid, Moderate correction tid, and HS correction  Note:  Diabetes Coordinator consult received to arrange for OP Diabetes Education follow-up after discharge.  Patient receptive to participating in Outpatient Diabetes Education.  Referral [facility] to [facility].  Patient aware that she will receive a telephone call to arrange appointment for outpatient diabetes education.  Thank you.  Bettyjo Lundblad S. Marcelline Mates, RN, CNS, CDE Inpatient Diabetes Program, team pager [phone]

## 2013-07-09 NOTE — Progress Notes (Signed)
The patient was given dietary tips for the purpose of managing her diabetes and heart failure.  She also requested a dietary consult.  The RN placed the per protocol order.

## 2013-07-09 NOTE — Discharge Summary (Signed)
.   Physician Discharge Summary  [name] RAQ:[id number] DOB: [birth year] DOA: 07/07/2013  PCP: Angelica Chessman, MD  Admit date: 07/07/2013 Discharge date: 07/09/2013  Time spent:35 minutes  Recommendations for Outpatient Follow-up:  1. Follow up with cardiology in 1 week, b-met at that time. 2. Titrate medications as needed. 3. Follow up with PCP in 2 week. Titrate diabetes medication as tolerate it.  BNP    Component Value Date/Time   PROBNP 4184.0* 07/07/2013 0008   Filed Weights   07/08/13 0551 07/08/13 0558 07/09/13 0551  Weight: 78.68 kg (173 lb 7.3 oz) 78.26 kg (172 lb 8.5 oz) 74.299 kg (163 lb 12.8 oz)     Discharge Diagnoses:  Principal Problem:   Acute on chronic systolic CHF (congestive heart failure) Active Problems:   DM2 (diabetes mellitus, type 2)   HTN (hypertension)   Fever, unspecified   CAP (community acquired pneumonia)   Discharge Condition: Stable  Diet recommendation: low sodium diet    History of present illness:  58 y.o. female who presents to the ED with 2 week history of worsening cough and SOB. Symptoms have been associated with worsening peripheral edema as well patient states. Patient has a history of CHF with admissions for this in the past. Last echo was late last year with slightly reduced EF   Hospital Course:  Acute respiratory failure due to Acute on chronic combined CHF (congestive heart failure) and CAP:  - WStarted on IV lasix, one her breathing improved and JVD resolved. - Change to oral lasix, cont spironolactone.  - Placed her on Strict I and O's low sodium diet.  - will follow up with cardiology check b-met at time of appointment. - started on  rocephin and azithro started on 3.7.2015. Deescalate to Levaquin, will cont for 3 additional days. - cultures negative.  HTN (hypertension)  - High due to vol overload. As she diureses her Bp improved. - Cont coreg and ACE-I.  Titrate as tolerate it as an inpatinet, will  need further titration as an outpatient. - Cont spironolactone. b-me tin 1 week.  DM2 (diabetes mellitus, type 2)  - d/c metformin start lantus Plus SSI on admission. - once stable change to her oral metformin. - will follow up with PCP and re-evalate CBG's monitoring. - OP diabetes education.   Procedures:  CXR  Consultations:  none  Discharge Exam: Filed Vitals:   07/09/13 0938  BP: 174/81  Pulse: 80  Temp:   Resp:     General: A&O x3 Cardiovascular: RRR Respiratory: good air movement CTA B/L  Discharge Instructions      Discharge Orders   Future Appointments Provider Department Dept Phone   07/17/2013 4:00 PM Candee Furbish, MD [place] [phone]   08/01/2013 9:30 AM [facility] [place] [facility] [address] [phone]   08/01/2013 10:00 AM [facility] [place] [facility] [address] [phone]   08/01/2013 10:45 AM Elam Dutch, MD [facility] [phone]   Future Orders Complete By Expires   Diet - low sodium heart healthy  As directed    Increase activity slowly  As directed        Medication List         aspirin 81 MG tablet  Take 81 mg by mouth daily.     carvedilol 25 MG tablet  Commonly known as:  COREG  Take 25 mg by mouth 2 (two) times daily with a meal.  glipiZIDE 5 MG tablet  Commonly known as:  GLUCOTROL  Take 5 mg by mouth daily.     HYDROcodone-acetaminophen 5-325 MG per tablet  Commonly known as:  NORCO/VICODIN  Take 1-2 tablets by mouth every 6 (six) hours as needed for moderate pain.     hydroxypropyl methylcellulose 2.5 % ophthalmic solution  Commonly known as:  ISOPTO TEARS  Place 1 drop into both eyes daily as needed for dry eyes.     levofloxacin 750 MG tablet  Commonly known as:  LEVAQUIN  Take 1 tablet (750 mg total) by mouth every other day.  Start taking on:  07/11/2013     lisinopril 20 MG tablet  Commonly known as:  PRINIVIL,ZESTRIL  Take  20 mg by mouth 2 (two) times daily.     metFORMIN 500 MG tablet  Commonly known as:  GLUCOPHAGE  Take 1,000 mg by mouth 2 (two) times daily with a meal.     NYQUIL COLD & FLU PO  Take 10 mLs by mouth 2 (two) times daily as needed (For sympoms of cough and congestion).     spironolactone 25 MG tablet  Commonly known as:  ALDACTONE  Take 1 tablet (25 mg total) by mouth daily.       Allergies  Allergen Reactions  . Plavix [Clopidogrel Bisulfate] Rash   Follow-up Information   Follow up with Kirk Ruths, MD In 1 week. (hospital follow up)    Specialty:  Cardiology   Contact information:   [address] [place] [address] [phone]        The results of significant diagnostics from this hospitalization (including imaging, microbiology, ancillary and laboratory) are listed below for reference.    Significant Diagnostic Studies: Dg Chest 2 View (if Patient Has Fever And/or Copd)  07/06/2013   CLINICAL DATA:  Cough with shortness of breath for 2 weeks.  EXAM: CHEST  2 VIEW  COMPARISON:  NM MYOCAR MULTI w/SPECT w/WALL MOTION / EF dated 05/17/2013; DG CHEST 2 VIEW dated 10/30/2012  FINDINGS: Cardiomegaly. Bilateral pulmonary opacities associated with small effusions and fluid in the fissures consistent with congestive heart failure. No pneumothorax. Negative osseous structures. Worsening aeration from priors.  IMPRESSION: Cardiomegaly with early CHF.  Bilateral pneumonia is less favored.   Electronically Signed   By: Rolla Flatten M.D.   On: 07/06/2013 23:00    Microbiology: No results found for this or any previous visit (from the past 240 hour(s)).   Labs: Basic Metabolic Panel:  Recent Labs Lab 07/06/13 2205 07/08/13 0536 07/09/13 0338  NA 138 142 140  K 4.2 3.2* 4.4  CL 103 98 100  CO2 '22 28 24  ' GLUCOSE 208* 111* 240*  BUN 18 19 31*  CREATININE 1.11* 1.32* 1.75*  CALCIUM 9.4 9.0 9.0   Liver Function Tests: No results found for this basename: AST,  ALT, ALKPHOS, BILITOT, PROT, ALBUMIN,  in the last 168 hours No results found for this basename: LIPASE, AMYLASE,  in the last 168 hours No results found for this basename: AMMONIA,  in the last 168 hours CBC:  Recent Labs Lab 07/06/13 2205  WBC 7.8  HGB 9.1*  HCT 28.5*  MCV 89.1  PLT 235   Cardiac Enzymes: No results found for this basename: CKTOTAL, CKMB, CKMBINDEX, TROPONINI,  in the last 168 hours BNP: BNP (last 3 results)  Recent Labs  10/30/12 1207 07/07/13 0008  PROBNP 2655.0* 4184.0*   CBG:  Recent Labs Lab 07/08/13 0556  07/08/13 1041 07/08/13 1617 07/08/13 2104 07/09/13 0541  GLUCAP 114* 173* 98 255* 190*       Signed:  Charlynne Cousins  [facility] 07/09/2013, 9:41 AM

## 2013-07-12 ENCOUNTER — Ambulatory Visit: Payer: No Typology Code available for payment source | Admitting: Internal Medicine

## 2013-07-17 ENCOUNTER — Encounter: Payer: Self-pay | Admitting: Cardiology

## 2013-07-17 ENCOUNTER — Ambulatory Visit (INDEPENDENT_AMBULATORY_CARE_PROVIDER_SITE_OTHER): Payer: No Typology Code available for payment source | Admitting: Cardiology

## 2013-07-17 VITALS — BP 120/70 | HR 78 | Ht 63.0 in | Wt 158.0 lb

## 2013-07-17 DIAGNOSIS — I739 Peripheral vascular disease, unspecified: Secondary | ICD-10-CM

## 2013-07-17 DIAGNOSIS — E1165 Type 2 diabetes mellitus with hyperglycemia: Secondary | ICD-10-CM

## 2013-07-17 DIAGNOSIS — E1122 Type 2 diabetes mellitus with diabetic chronic kidney disease: Secondary | ICD-10-CM | POA: Insufficient documentation

## 2013-07-17 DIAGNOSIS — E1151 Type 2 diabetes mellitus with diabetic peripheral angiopathy without gangrene: Secondary | ICD-10-CM

## 2013-07-17 DIAGNOSIS — E1159 Type 2 diabetes mellitus with other circulatory complications: Secondary | ICD-10-CM

## 2013-07-17 DIAGNOSIS — I509 Heart failure, unspecified: Secondary | ICD-10-CM

## 2013-07-17 DIAGNOSIS — I798 Other disorders of arteries, arterioles and capillaries in diseases classified elsewhere: Secondary | ICD-10-CM

## 2013-07-17 DIAGNOSIS — I5022 Chronic systolic (congestive) heart failure: Secondary | ICD-10-CM | POA: Insufficient documentation

## 2013-07-17 DIAGNOSIS — D649 Anemia, unspecified: Secondary | ICD-10-CM | POA: Insufficient documentation

## 2013-07-17 HISTORY — DX: Anemia, unspecified: D64.9

## 2013-07-17 LAB — BASIC METABOLIC PANEL
BUN: 59 mg/dL — ABNORMAL HIGH (ref 6–23)
CO2: 21 mEq/L (ref 19–32)
CREATININE: 3.32 mg/dL — AB (ref 0.50–1.10)
Calcium: 10.1 mg/dL (ref 8.4–10.5)
Chloride: 104 mEq/L (ref 96–112)
Glucose, Bld: 112 mg/dL — ABNORMAL HIGH (ref 70–99)
Potassium: 6 mEq/L — ABNORMAL HIGH (ref 3.5–5.3)
Sodium: 136 mEq/L (ref 135–145)

## 2013-07-17 NOTE — Patient Instructions (Signed)
Your physician recommends that you return for lab work today for bmet.  Your physician recommends that you schedule a follow-up appointment with Dr. Terrence Dupont in 2 weeks. He is your cardiologist.

## 2013-07-17 NOTE — Progress Notes (Signed)
Cardiologist: Dr. Terrence Dupont  [address] [place], [place]  [address] Phone: [phone] Fax:  [phone]  Date:  07/17/2013   ID:  [name], DOB 06/26/1955, MRN [id number]  PCP:  Angelica Chessman, MD   History of Present Illness: [name] is a 58 y.o. female here for the evaluation of shortness of breath. She was in the hospital on 07/09/13 and was admitted with a presumed diagnosis of congestive heart failure and community acquired pneumonia. Antibiotics were given. IV Lasix was utilized and breathing was improved.  An echocardiogram was performed on 07/07/13 and demonstrated an ejection fraction of 30-35%. She has comorbidities of diabetes, hypertension, PVD - Dr. Oneida Alar.   Her breathing is much better. She does feel tired overall. She had some minor nausea yesterday. She finished her antibiotic yesterday. She is here today likely by accidental scheduling. I'm sure her hospital team did not realize that she sees Dr. Terrence Dupont. He has been following her for some time. He has been titrating her medications. I have not personally met her previously.  Wt Readings from Last 3 Encounters:  07/17/13 158 lb (71.668 kg)  07/09/13 163 lb 12.8 oz (74.299 kg)  02/22/13 163 lb (73.936 kg)     Past Medical History  Diagnosis Date  . Hypertension   . Diabetes mellitus   . Arthritis   . PVD (peripheral vascular disease)     Past Surgical History  Procedure Laterality Date  . Abdominal angiogram  12/28/2012    Current Outpatient Prescriptions  Medication Sig Dispense Refill  . aspirin 81 MG tablet Take 81 mg by mouth daily.      . carvedilol (COREG) 25 MG tablet Take 25 mg by mouth 2 (two) times daily with a meal.      . DM-Doxylamine-Acetaminophen (NYQUIL COLD & FLU PO) Take 10 mLs by mouth 2 (two) times daily as needed (For sympoms of cough and congestion).      Marland Kitchen glipiZIDE (GLUCOTROL) 5 MG tablet Take 5 mg by mouth daily.      Marland Kitchen HYDROcodone-acetaminophen  (NORCO/VICODIN) 5-325 MG per tablet Take 1-2 tablets by mouth every 6 (six) hours as needed for moderate pain.      . hydroxypropyl methylcellulose (ISOPTO TEARS) 2.5 % ophthalmic solution Place 1 drop into both eyes daily as needed for dry eyes.      Marland Kitchen levofloxacin (LEVAQUIN) 750 MG tablet Take 1 tablet (750 mg total) by mouth every other day.  3 tablet  0  . lisinopril (PRINIVIL,ZESTRIL) 20 MG tablet Take 20 mg by mouth 2 (two) times daily.      . metFORMIN (GLUCOPHAGE) 500 MG tablet Take 1,000 mg by mouth 2 (two) times daily with a meal.       . spironolactone (ALDACTONE) 25 MG tablet Take 1 tablet (25 mg total) by mouth daily.  14 tablet  0   No current facility-administered medications for this visit.    Allergies:    Allergies  Allergen Reactions  . Plavix [Clopidogrel Bisulfate] Rash    Social History:  The patient  reports that she has never smoked. She has never used smokeless tobacco. She reports that she does not drink alcohol or use illicit drugs.   Family History  Problem Relation Age of Onset  . Diabetes Mother     ROS:  Please see the history of present illness.   Minor nausea, improved shortness of breath, occasional chest tightness.  All other systems reviewed  and negative.   PHYSICAL EXAM: VS:  BP 120/70  Pulse 78  Ht _0  (1.6 m)  Wt 158 lb (71.668 kg)  BMI 28.00 kg/m2 Well nourished, well developed, in no acute distress HEENT: normal, [place]/AT, EOMI Neck: no JVD, normal carotid upstroke, no bruit Cardiac:  normal S1, S2; RRR; no murmur Lungs:  clear to auscultation bilaterally, no wheezing, rhonchi or rales Abd: soft, nontender, no hepatomegaly, no bruits Ext: no edema, 2+ distal pulses Skin: warm and dry GU: deferred Neuro: no focal abnormalities noted, AAO x 3 mildly tired appearing BNP (last 3 results)  Recent Labs  10/30/12 1207 07/07/13 0008  PROBNP 2655.0* 4184.0*    EKG:  None today Echocardiogram 07/07/13: - Left ventricle: The cavity size  was normal. Wall thickness was increased in a pattern of moderate LVH. Systolic function was moderately to severely reduced. The estimated ejection fraction was in the range of 30% to 35%. Diffuse hypokinesis. Moderate hypokinesis. Features are consistent with a pseudonormal left ventricular filling pattern, with concomitant abnormal relaxation and increased filling pressure (grade 2 diastolic dysfunction). - Aortic valve: Mild to moderate regurgitation. - Mitral valve: Moderately calcified annulus. Normal thickness leaflets . Mild regurgitation. - Left atrium: The atrium was mildly to moderately dilated. - Right atrium: The atrium was mildly dilated. - Tricuspid valve: Moderate regurgitation. - Pulmonary arteries: Systolic pressure was mildly to moderately increased. PA peak pressure: 4m Hg (S).   Nuclear stress test 05/17/13-EF calculated 46%, limited exam due to gut uptake.  ASSESSMENT AND PLAN:  1. Chronic systolic heart failure-her creatinine was 1.1 on 07/06/13 and 1.75 on 07/09/13. Checking a basic metabolic profile today. She is not on furosemide. She is on Aldactone. She has had prior workup with Dr. HTerrence Dupont 2. Anemia-hemoglobin previously 9.1. Continue to monitor. 3. Peripheral vascular disease-she sees Dr. FOneida Alar 4. I will make sure that she has a followup appointment with Dr. HTerrence Dupont  Signed, MCandee Furbish MD [facility] 07/17/2013 5:06 PM

## 2013-07-18 ENCOUNTER — Telehealth: Payer: Self-pay | Admitting: *Deleted

## 2013-07-18 ENCOUNTER — Telehealth: Payer: Self-pay | Admitting: Cardiology

## 2013-07-18 MED ORDER — SODIUM POLYSTYRENE SULFONATE PO POWD: Freq: Once | ORAL | Status: DC

## 2013-07-18 NOTE — Telephone Encounter (Signed)
Colletta Maryland returns call with much better price & she has found pt can get medication today She will call pt with this information Horton Chin RN

## 2013-07-18 NOTE — Telephone Encounter (Signed)
Pharmacist, Colletta Maryland, calls b/c of the price for kayexalate & availability. Price: powder form is  $90.00           Liquid    Form is $75.40  Availability: not until tomorrow.  Will forward to Dr. Marlou Porch & return call to pharmacy  Horton Chin RN

## 2013-07-18 NOTE — Telephone Encounter (Signed)
[employer] called stating that she found a better option for the kayexalate that would only cost the patient $26.09. Please call them back to let them know if this would work. Thanks, MI

## 2013-07-18 NOTE — Telephone Encounter (Signed)
New message    Sodium polystyrene is 90.00 for the pt.  Is there something cheaper she can get?   Pt is also on spironolactone and lisinopril---which both raise calcium.  Are you aware of this?

## 2013-07-18 NOTE — Telephone Encounter (Signed)
Dr. Marlou Porch is aware of the current bmp results & would like pt to stop spironolactone & lisinopril. Send lab results to Dr. Terrence Dupont & have pt follow-up in next couple of days with Dr. Terrence Dupont.  I have called pt & she will stop lisinopril & spironolactone today. She has not yet taken meds. She has not called Dr. Zenia Resides office for an appt.  Dr. Marlou Porch has talked with Dr. Terrence Dupont. They will see her tomorrow & recheck labs. Dr. Zenia Resides office will call her with the appt. He would also like for her to take a 1 time dose of Kaexyelate 30gm today.  Pt is aware & will pick up kayexalate today. Horton Chin RN

## 2013-07-31 ENCOUNTER — Encounter: Payer: Self-pay | Admitting: Vascular Surgery

## 2013-08-01 ENCOUNTER — Ambulatory Visit (INDEPENDENT_AMBULATORY_CARE_PROVIDER_SITE_OTHER): Payer: No Typology Code available for payment source | Admitting: Vascular Surgery

## 2013-08-01 ENCOUNTER — Encounter: Payer: Self-pay | Admitting: Vascular Surgery

## 2013-08-01 ENCOUNTER — Ambulatory Visit (INDEPENDENT_AMBULATORY_CARE_PROVIDER_SITE_OTHER)
Admission: RE | Admit: 2013-08-01 | Discharge: 2013-08-01 | Disposition: A | Discharge status: Home / Self Care | Payer: No Typology Code available for payment source | Source: Ambulatory Visit | Attending: Vascular Surgery | Admitting: Vascular Surgery

## 2013-08-01 ENCOUNTER — Ambulatory Visit (HOSPITAL_COMMUNITY)
Admission: RE | Admit: 2013-08-01 | Discharge: 2013-08-01 | Disposition: A | Discharge status: Home / Self Care | Payer: No Typology Code available for payment source | Source: Ambulatory Visit | Attending: Vascular Surgery | Admitting: Vascular Surgery

## 2013-08-01 VITALS — BP 176/75 | HR 72 | Ht 63.0 in | Wt 158.0 lb

## 2013-08-01 DIAGNOSIS — I739 Peripheral vascular disease, unspecified: Secondary | ICD-10-CM

## 2013-08-01 NOTE — Progress Notes (Signed)
Patient is a 58 year old female who previously underwent left superficial femoral artery stenting in August of 2013 by my partner Dr. Bridgett Larsson. This was done for a nonhealing wound of her left first toe. The toe has now completely healed. She is not smoking. She denies claudication or rest pain symptoms. She does complain of some occasional burning and tingling in her left thigh. She has been seeing Dr. Terrence Dupont for what sounds like cardiomegaly and hypertension.  Chronic medical problems include the above plus diabetes. 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 She is on aspirin. She is awaiting hip replacement in the near future.    Past Medical History   Diagnosis  Date   .  Hypertension     .  Diabetes mellitus     .  Arthritis     .  PVD (peripheral vascular disease)      Review of systems: She become short of breath with minimal exertion. She denies chest pain.    Current Outpatient Prescriptions on File Prior to Visit   Medication  Sig  Dispense  Refill   .  aspirin 81 MG tablet  Take 81 mg by mouth daily.         .  carvedilol (COREG) 3.125 MG tablet  Take 1 tablet (3.125 mg total) by mouth 2 (two) times daily with a meal.   60 tablet   3   .  glipiZIDE (GLUCOTROL) 5 MG tablet  Take 5 mg by mouth daily.         .  metFORMIN (GLUCOPHAGE) 500 MG tablet  Take 1,000 mg by mouth 2 (two) times daily with a meal.          .  Ophthalmic Irrigation Solution (EYE [place] OP)  Place 1 Applicatorful into both eyes as needed (to wash eyes out).         Marland Kitchen  spironolactone (ALDACTONE) 25 MG tablet  Take 1 tablet (25 mg total) by mouth daily.   14 tablet   0   .  traMADol (ULTRAM) 50 MG tablet  Take 50 mg by mouth every 6 (six) hours as needed for pain.            No current facility-administered medications on file prior to visit.      Physical exam:     Filed Vitals:   08/01/13 1118  BP: 176/75  Pulse: 72  Height: 5\' 3"  (1.6 m)  Weight: 158 lb (71.668 kg)  SpO2: 100%   Vascular: She has  2+ femoral pulses bilaterally, absent popliteal and pedal pulses.   Skin: Ulceration left first toe completely healed with jagged irregular toe nail Neck: Left-sided carotid bruit no right bruit Chest: Clear to auscultation bilaterally Cardiac: Regular rate rhythm without murmur  Data: She had an arterial duplex exam performed of the left leg today with bilateral ABIs. ABI on the right was 0.6 left was 0.7 the left superficial femoral artery was patent with more than 50% stenosis indicating some possible progression of prior stenosis.    Assessment: Peripheral arterial disease with patent left superficial femoral artery angioplasty but suggestion of narrowing  Plan: Aortogram with bilateral lower extremity runoff from 12/21/2012. She has unreconstructable tibial artery occlusive disease. She did have some narrowing of the superficial femoral artery. However, I think any further interventions in the left superficial femoral artery was not really have benefit to her downstream circulation. We'll continue to see her intermittently for repeat ABIs in 6 months. New left  side carotid bruit we'll evaluate with carotid duplex next week. If moderate carotid stenosis we'll get serial followup carotid duplex scans. If severe stenosis we'll consider intervention for that.  Ruta Hinds, MD   [facility]   Office: [phone]   Pager: [phone]

## 2013-08-06 ENCOUNTER — Other Ambulatory Visit: Payer: Self-pay | Admitting: Orthopedic Surgery

## 2013-08-08 ENCOUNTER — Ambulatory Visit (HOSPITAL_COMMUNITY)
Admission: RE | Admit: 2013-08-08 | Discharge: 2013-08-08 | Disposition: A | Discharge status: Home / Self Care | Payer: No Typology Code available for payment source | Source: Ambulatory Visit | Attending: Vascular Surgery | Admitting: Vascular Surgery

## 2013-08-08 ENCOUNTER — Other Ambulatory Visit: Payer: Self-pay | Admitting: Vascular Surgery

## 2013-08-08 DIAGNOSIS — R0989 Other specified symptoms and signs involving the circulatory and respiratory systems: Secondary | ICD-10-CM

## 2013-08-08 DIAGNOSIS — I739 Peripheral vascular disease, unspecified: Secondary | ICD-10-CM | POA: Insufficient documentation

## 2013-08-09 ENCOUNTER — Encounter (HOSPITAL_COMMUNITY): Payer: Self-pay | Admitting: Pharmacy Technician

## 2013-08-13 ENCOUNTER — Encounter (HOSPITAL_COMMUNITY): Payer: Self-pay

## 2013-08-13 ENCOUNTER — Ambulatory Visit (HOSPITAL_COMMUNITY)
Admission: RE | Admit: 2013-08-13 | Discharge: 2013-08-13 | Disposition: A | Discharge status: Home / Self Care | Payer: No Typology Code available for payment source | Source: Ambulatory Visit | Attending: Orthopedic Surgery | Admitting: Orthopedic Surgery

## 2013-08-13 ENCOUNTER — Ambulatory Visit: Payer: No Typology Code available for payment source

## 2013-08-13 ENCOUNTER — Encounter (HOSPITAL_COMMUNITY)
Admission: RE | Admit: 2013-08-13 | Discharge: 2013-08-13 | Disposition: A | Discharge status: Home / Self Care | Payer: No Typology Code available for payment source | Source: Ambulatory Visit | Attending: Orthopedic Surgery | Admitting: Orthopedic Surgery

## 2013-08-13 DIAGNOSIS — Z01818 Encounter for other preprocedural examination: Secondary | ICD-10-CM | POA: Insufficient documentation

## 2013-08-13 HISTORY — DX: Heart failure, unspecified: I50.9

## 2013-08-13 LAB — URINE MICROSCOPIC-ADD ON

## 2013-08-13 LAB — BASIC METABOLIC PANEL
BUN: 30 mg/dL — AB (ref 6–23)
CALCIUM: 9.9 mg/dL (ref 8.4–10.5)
CHLORIDE: 100 meq/L (ref 96–112)
CO2: 22 mEq/L (ref 19–32)
CREATININE: 1.18 mg/dL — AB (ref 0.50–1.10)
GFR calc non Af Amer: 51 mL/min — ABNORMAL LOW (ref >90)
GFR, EST AFRICAN AMERICAN: 59 mL/min — AB (ref >90)
Glucose, Bld: 238 mg/dL — ABNORMAL HIGH (ref 70–99)
Potassium: 4.5 mEq/L (ref 3.7–5.3)
Sodium: 137 mEq/L (ref 137–147)

## 2013-08-13 LAB — SURGICAL PCR SCREEN
MRSA, PCR: NEGATIVE
STAPHYLOCOCCUS AUREUS: NEGATIVE

## 2013-08-13 LAB — CBC WITH DIFFERENTIAL/PLATELET
BASOS ABS: 0 10*3/uL (ref 0.0–0.1)
BASOS PCT: 0 % (ref 0–1)
Eosinophils Absolute: 0.2 10*3/uL (ref 0.0–0.7)
Eosinophils Relative: 3 % (ref 0–5)
HEMATOCRIT: 36.6 % (ref 36.0–46.0)
Hemoglobin: 12.1 g/dL (ref 12.0–15.0)
LYMPHS PCT: 37 % (ref 12–46)
Lymphs Abs: 2.5 10*3/uL (ref 0.7–4.0)
MCH: 28.3 pg (ref 26.0–34.0)
MCHC: 33.1 g/dL (ref 30.0–36.0)
MCV: 85.7 fL (ref 78.0–100.0)
MONO ABS: 0.4 10*3/uL (ref 0.1–1.0)
Monocytes Relative: 5 % (ref 3–12)
Neutro Abs: 3.8 10*3/uL (ref 1.7–7.7)
Neutrophils Relative %: 55 % (ref 43–77)
PLATELETS: 296 10*3/uL (ref 150–400)
RBC: 4.27 MIL/uL (ref 3.87–5.11)
RDW: 14.7 % (ref 11.5–15.5)
WBC: 6.9 10*3/uL (ref 4.0–10.5)

## 2013-08-13 LAB — URINALYSIS, ROUTINE W REFLEX MICROSCOPIC
BILIRUBIN URINE: NEGATIVE
HGB URINE DIPSTICK: NEGATIVE
Ketones, ur: NEGATIVE mg/dL
Nitrite: NEGATIVE
Protein, ur: NEGATIVE mg/dL
Specific Gravity, Urine: 1.03 (ref 1.005–1.030)
Urobilinogen, UA: 0.2 mg/dL (ref 0.0–1.0)
pH: 5 (ref 5.0–8.0)

## 2013-08-13 LAB — PROTIME-INR
INR: 0.93 (ref 0.00–1.49)
PROTHROMBIN TIME: 12.3 s (ref 11.6–15.2)

## 2013-08-13 LAB — TYPE AND SCREEN
ABO/RH(D): A POS
ANTIBODY SCREEN: NEGATIVE

## 2013-08-13 LAB — ABO/RH: ABO/RH(D): A POS

## 2013-08-13 LAB — APTT: aPTT: 31 seconds (ref 24–37)

## 2013-08-13 NOTE — Pre-Procedure Instructions (Signed)
[name]  08/13/2013   Your procedure is scheduled on:  Monday, April 20th   Report to [place]  2 * 3 at 8:00 AM.   Call this number if you have problems the morning of surgery: [phone]   Remember:   Do not eat food or drink liquids after midnight Sunday.   Take these medicines the morning of surgery with A SIP OF WATER: Carvedilol, Hydrocodone   Do not wear jewelry, make-up or nail polish.  Do not wear lotions, powders, or perfumes. You may NOT wear deodorant.  Do not shave underarms & legs 48 hours prior to surgery.   Do not bring valuables to the hospital.  [facility] is not responsible for any belongings or valuables.               Contacts, dentures or bridgework may not be worn into surgery.  Leave suitcase in the car. After surgery it may be brought to your room.  For patients admitted to the hospital, discharge time is determined by your treatment team.   Name and phone number of your driver:    Special Instructions: "Preparing for Surgery" instruction sheet   Please read over the following fact sheets that you were given: Pain Booklet, Coughing and Deep Breathing, Blood Transfusion Information, MRSA Information and Surgical Site Infection Prevention

## 2013-08-13 NOTE — Progress Notes (Signed)
Tried calling Dr. Damita Dunnings office (5:18)  To make him aware of UA results.  [id number].  Receptionist took message to have him look into Epic and ua.  He may try calling [facility] tomorrow (wed).Marland KitchenMarland KitchenDA

## 2013-08-14 NOTE — Progress Notes (Signed)
Spoke with Amy at Dr. Shaune Spittle office regarding abnormal UA.

## 2013-08-16 NOTE — H&P (Signed)
TOTAL HIP ADMISSION H&P  Patient is admitted for left total hip arthroplasty.  Subjective:  Chief Complaint: left hip pain  HPI: [name], 58 y.o. female, has a history of pain and functional disability in the left hip(s) due to arthritis and patient has failed non-surgical conservative treatments for greater than 12 weeks to include NSAID's and/or analgesics, flexibility and strengthening excercises, use of assistive devices and activity modification.  Onset of symptoms was gradual starting >10 years ago with gradually worsening course since that time.The patient noted no past surgery on the left hip(s).  Patient currently rates pain in the left hip at 10 out of 10 with activity. Patient has night pain, worsening of pain with activity and weight bearing, pain that interfers with activities of daily living and pain with passive range of motion. Patient has evidence of subchondral sclerosis, periarticular osteophytes and joint space narrowing by imaging studies. This condition presents safety issues increasing the risk of falls.   There is no current active infection.  Patient Active Problem List   Diagnosis Date Noted  . Diabetes mellitus with peripheral vascular disease 07/17/2013  . Anemia 07/17/2013  . Chronic systolic heart failure [id number]  . Acute combined systolic and diastolic CHF, NYHA class 2 07/07/2013  . Fever, unspecified 07/07/2013  . CAP (community acquired pneumonia) 07/07/2013  . Osteoarthritis of left hip 02/22/2013  . PVD (peripheral vascular disease) 11/28/2012  . LVH (left ventricular hypertrophy) due to hypertensive disease 11/22/2012  . CKD (chronic kidney disease) 11/22/2012  . DM2 (diabetes mellitus, type 2) 11/08/2012  . HTN (hypertension) 11/08/2012  . Atherosclerosis of native arteries of the extremities with ulceration(440.23) 08/16/2012   Past Medical History  Diagnosis Date  . Hypertension   . Diabetes mellitus   . Arthritis   . PVD (peripheral  vascular disease)   . CHF (congestive heart failure)     2014...@ [facility]    Past Surgical History  Procedure Laterality Date  . Abdominal angiogram  12/28/2012  . Tubal ligation      No prescriptions prior to admission   Allergies  Allergen Reactions  . Plavix [Clopidogrel Bisulfate] Rash    History  Substance Use Topics  . Smoking status: Never Smoker   . Smokeless tobacco: Never Used  . Alcohol Use: No     Comment: ocassionally    Family History  Problem Relation Age of Onset  . Diabetes Mother      Review of Systems  HENT: Negative.   Eyes: Negative.   Respiratory: Negative.   Cardiovascular: Negative.   Gastrointestinal: Negative.   Genitourinary: Negative.   Musculoskeletal: Positive for joint pain.  Skin: Negative.   Neurological: Negative.   Psychiatric/Behavioral: Positive for depression.    Objective:  Physical Exam  Constitutional: She is oriented to person, place, and time. She appears well-developed and well-nourished.  HENT:  Head: Normocephalic and atraumatic.  Eyes: Pupils are equal, round, and reactive to light.  Neck: Normal range of motion. Neck supple.  Cardiovascular: Intact distal pulses.   Respiratory: Effort normal.  Musculoskeletal:  The patient has maybe 5 of internal, external rotation of each hip, 10 flexion contractures and pain to any attempts of range of motion of the hips.  Her skin is intact.  She has normal sensation to her legs minutes sensation of the foot pads.  Neurological: She is alert and oriented to person, place, and time.  Skin: Skin is warm and dry.  Psychiatric: She has a normal mood and affect.  Her behavior is normal. Judgment and thought content normal.    Vital signs in last 24 hours:    Labs:   Estimated body mass index is 28.00 kg/(m^2) as calculated from the following:   Height as of 08/01/13: 5\' 3"  (1.6 m).   Weight as of 08/01/13: 71.668 kg (158 lb).   Imaging Review Radiographs:  X-rays were  ordered, performed, and interpreted by me today included; AP pelvis and crosstable lateral of both hips shows complete obliteration of the joint spaces bone-on-bone with 20% collapse of both femoral heads.  Assessment/Plan:  End stage arthritis, left hip(s)  The patient history, physical examination, clinical judgement of the provider and imaging studies are consistent with end stage degenerative joint disease of the left hip(s) and total hip arthroplasty is deemed medically necessary. The treatment options including medical management, injection therapy, arthroscopy and arthroplasty were discussed at length. The risks and benefits of total hip arthroplasty were presented and reviewed. The risks due to aseptic loosening, infection, stiffness, dislocation/subluxation,  thromboembolic complications and other imponderables were discussed.  The patient acknowledged the explanation, agreed to proceed with the plan and consent was signed. Patient is being admitted for inpatient treatment for surgery, pain control, PT, OT, prophylactic antibiotics, VTE prophylaxis, progressive ambulation and ADL's and discharge planning.The patient is planning to be discharged to skilled nursing facility

## 2013-08-18 DIAGNOSIS — M1612 Unilateral primary osteoarthritis, left hip: Secondary | ICD-10-CM

## 2013-08-18 HISTORY — DX: Unilateral primary osteoarthritis, left hip: M16.12

## 2013-08-18 MED ORDER — CEFAZOLIN SODIUM-DEXTROSE 2-3 GM-% IV SOLR: 2.0000 g | INTRAVENOUS | Status: DC

## 2013-08-18 MED ORDER — CHLORHEXIDINE GLUCONATE 4 % EX LIQD
60.0000 mL | Freq: Once | CUTANEOUS | Status: DC
  Filled 2013-08-18: qty 60

## 2013-08-18 MED ORDER — DEXTROSE-NACL 5-0.45 % IV SOLN: INTRAVENOUS | Status: DC

## 2013-08-19 ENCOUNTER — Encounter (HOSPITAL_COMMUNITY)
Admission: RE | Disposition: A | Discharge status: Skilled Nursing Facility | Payer: Self-pay | Source: Ambulatory Visit | Attending: Orthopedic Surgery

## 2013-08-19 ENCOUNTER — Encounter (HOSPITAL_COMMUNITY): Payer: Self-pay | Admitting: *Deleted

## 2013-08-19 ENCOUNTER — Inpatient Hospital Stay (HOSPITAL_COMMUNITY)
Admission: RE | Admit: 2013-08-19 | Discharge: 2013-08-22 | DRG: 469 | Disposition: A | Discharge status: Skilled Nursing Facility | Payer: No Typology Code available for payment source | Source: Ambulatory Visit | Attending: Orthopedic Surgery | Admitting: Orthopedic Surgery

## 2013-08-19 ENCOUNTER — Inpatient Hospital Stay (HOSPITAL_COMMUNITY): Payer: No Typology Code available for payment source | Admitting: Anesthesiology

## 2013-08-19 ENCOUNTER — Inpatient Hospital Stay (HOSPITAL_COMMUNITY): Payer: No Typology Code available for payment source

## 2013-08-19 ENCOUNTER — Encounter (HOSPITAL_COMMUNITY): Payer: No Typology Code available for payment source | Admitting: Anesthesiology

## 2013-08-19 DIAGNOSIS — M169 Osteoarthritis of hip, unspecified: Principal | ICD-10-CM | POA: Diagnosis present

## 2013-08-19 DIAGNOSIS — I798 Other disorders of arteries, arterioles and capillaries in diseases classified elsewhere: Secondary | ICD-10-CM | POA: Diagnosis present

## 2013-08-19 DIAGNOSIS — I509 Heart failure, unspecified: Secondary | ICD-10-CM | POA: Diagnosis present

## 2013-08-19 DIAGNOSIS — N189 Chronic kidney disease, unspecified: Secondary | ICD-10-CM | POA: Diagnosis present

## 2013-08-19 DIAGNOSIS — I5041 Acute combined systolic (congestive) and diastolic (congestive) heart failure: Secondary | ICD-10-CM | POA: Diagnosis present

## 2013-08-19 DIAGNOSIS — I739 Peripheral vascular disease, unspecified: Secondary | ICD-10-CM | POA: Diagnosis present

## 2013-08-19 DIAGNOSIS — M1610 Unilateral primary osteoarthritis, unspecified hip: Principal | ICD-10-CM | POA: Diagnosis present

## 2013-08-19 DIAGNOSIS — I130 Hypertensive heart and chronic kidney disease with heart failure and stage 1 through stage 4 chronic kidney disease, or unspecified chronic kidney disease: Secondary | ICD-10-CM | POA: Diagnosis present

## 2013-08-19 DIAGNOSIS — M1612 Unilateral primary osteoarthritis, left hip: Secondary | ICD-10-CM | POA: Diagnosis present

## 2013-08-19 DIAGNOSIS — E119 Type 2 diabetes mellitus without complications: Secondary | ICD-10-CM | POA: Diagnosis present

## 2013-08-19 DIAGNOSIS — E1159 Type 2 diabetes mellitus with other circulatory complications: Secondary | ICD-10-CM | POA: Diagnosis present

## 2013-08-19 DIAGNOSIS — Z888 Allergy status to other drugs, medicaments and biological substances status: Secondary | ICD-10-CM

## 2013-08-19 DIAGNOSIS — I2510 Atherosclerotic heart disease of native coronary artery without angina pectoris: Secondary | ICD-10-CM | POA: Diagnosis present

## 2013-08-19 DIAGNOSIS — D649 Anemia, unspecified: Secondary | ICD-10-CM | POA: Diagnosis present

## 2013-08-19 HISTORY — PX: TOTAL HIP ARTHROPLASTY: SHX124

## 2013-08-19 LAB — HEMOGLOBIN A1C
HEMOGLOBIN A1C: 9.3 % — AB (ref <5.7)
Mean Plasma Glucose: 220 mg/dL — ABNORMAL HIGH (ref <117)

## 2013-08-19 LAB — GLUCOSE, CAPILLARY
GLUCOSE-CAPILLARY: 182 mg/dL — AB (ref 70–99)
Glucose-Capillary: 191 mg/dL — ABNORMAL HIGH (ref 70–99)
Glucose-Capillary: 223 mg/dL — ABNORMAL HIGH (ref 70–99)
Glucose-Capillary: 172 mg/dL — ABNORMAL HIGH (ref 70–99)

## 2013-08-19 SURGERY — ARTHROPLASTY, HIP, TOTAL,POSTERIOR APPROACH
Anesthesia: General | Site: Hip | Laterality: Left

## 2013-08-19 MED ORDER — METOCLOPRAMIDE HCL 10 MG PO TABS: 5.0000 mg | ORAL_TABLET | Freq: Three times a day (TID) | ORAL | Status: DC | PRN

## 2013-08-19 MED ORDER — PHENYLEPHRINE HCL 10 MG/ML IJ SOLN
10.0000 mg | INTRAVENOUS | Status: DC | PRN
  Administered 2013-08-19: 25 ug/min via INTRAVENOUS

## 2013-08-19 MED ORDER — HYDROMORPHONE HCL PF 1 MG/ML IJ SOLN
0.5000 mg | Freq: Once | INTRAMUSCULAR | Status: AC
  Administered 2013-08-19: 0.5 mg via INTRAVENOUS

## 2013-08-19 MED ORDER — ROCURONIUM BROMIDE 50 MG/5ML IV SOLN
INTRAVENOUS | Status: AC
  Filled 2013-08-19: qty 1

## 2013-08-19 MED ORDER — GLYCOPYRROLATE 0.2 MG/ML IJ SOLN
INTRAMUSCULAR | Status: AC
  Filled 2013-08-19: qty 2

## 2013-08-19 MED ORDER — GLIPIZIDE 5 MG PO TABS
5.0000 mg | ORAL_TABLET | Freq: Two times a day (BID) | ORAL | Status: DC
  Administered 2013-08-19 – 2013-08-22 (×6): 5 mg via ORAL
  Filled 2013-08-19 (×8): qty 1

## 2013-08-19 MED ORDER — HYPROMELLOSE (GONIOSCOPIC) 2.5 % OP SOLN: 1.0000 [drp] | Freq: Every day | OPHTHALMIC | Status: DC | PRN

## 2013-08-19 MED ORDER — MORPHINE SULFATE 10 MG/ML IJ SOLN
INTRAMUSCULAR | Status: DC | PRN
  Administered 2013-08-19 (×3): 2.5 mg via INTRAVENOUS

## 2013-08-19 MED ORDER — ROCURONIUM BROMIDE 100 MG/10ML IV SOLN
INTRAVENOUS | Status: DC | PRN
  Administered 2013-08-19: 40 mg via INTRAVENOUS

## 2013-08-19 MED ORDER — ONDANSETRON HCL 4 MG/2ML IJ SOLN
INTRAMUSCULAR | Status: AC
  Filled 2013-08-19: qty 2

## 2013-08-19 MED ORDER — ASPIRIN EC 325 MG PO TBEC
325.0000 mg | DELAYED_RELEASE_TABLET | Freq: Every day | ORAL | Status: DC
  Administered 2013-08-20 – 2013-08-22 (×3): 325 mg via ORAL
  Filled 2013-08-19 (×5): qty 1

## 2013-08-19 MED ORDER — 0.9 % SODIUM CHLORIDE (POUR BTL) OPTIME
TOPICAL | Status: DC | PRN
  Administered 2013-08-19: 1000 mL

## 2013-08-19 MED ORDER — METHOCARBAMOL 100 MG/ML IJ SOLN
500.0000 mg | Freq: Once | INTRAVENOUS | Status: AC
  Administered 2013-08-19: 500 mg via INTRAVENOUS
  Filled 2013-08-19: qty 5

## 2013-08-19 MED ORDER — LIDOCAINE HCL (CARDIAC) 20 MG/ML IV SOLN
INTRAVENOUS | Status: AC
  Filled 2013-08-19: qty 5

## 2013-08-19 MED ORDER — MENTHOL 3 MG MT LOZG: 1.0000 | LOZENGE | OROMUCOSAL | Status: DC | PRN

## 2013-08-19 MED ORDER — FENTANYL CITRATE 0.05 MG/ML IJ SOLN
INTRAMUSCULAR | Status: DC | PRN
  Administered 2013-08-19: 100 ug via INTRAVENOUS
  Administered 2013-08-19: 50 ug via INTRAVENOUS
  Administered 2013-08-19: 100 ug via INTRAVENOUS

## 2013-08-19 MED ORDER — BUPIVACAINE-EPINEPHRINE 0.5% -1:200000 IJ SOLN
INTRAMUSCULAR | Status: DC | PRN
  Administered 2013-08-19: 10 mL

## 2013-08-19 MED ORDER — ACETAMINOPHEN 650 MG RE SUPP: 650.0000 mg | Freq: Four times a day (QID) | RECTAL | Status: DC | PRN

## 2013-08-19 MED ORDER — MIDAZOLAM HCL 5 MG/5ML IJ SOLN
INTRAMUSCULAR | Status: DC | PRN
  Administered 2013-08-19: 1 mg via INTRAVENOUS

## 2013-08-19 MED ORDER — ONDANSETRON HCL 4 MG PO TABS: 4.0000 mg | ORAL_TABLET | Freq: Four times a day (QID) | ORAL | Status: DC | PRN

## 2013-08-19 MED ORDER — MAGNESIUM CITRATE PO SOLN
1.0000 | Freq: Once | ORAL | Status: AC | PRN
Start: 2013-08-19 — End: 2013-08-19

## 2013-08-19 MED ORDER — LACTATED RINGERS IV SOLN
INTRAVENOUS | Status: DC | PRN
  Administered 2013-08-19: 09:00:00 via INTRAVENOUS

## 2013-08-19 MED ORDER — PHENOL 1.4 % MT LIQD: 1.0000 | OROMUCOSAL | Status: DC | PRN

## 2013-08-19 MED ORDER — PROPOFOL 10 MG/ML IV BOLUS
INTRAVENOUS | Status: DC | PRN
  Administered 2013-08-19: 110 mg via INTRAVENOUS

## 2013-08-19 MED ORDER — PROPOFOL 10 MG/ML IV BOLUS
INTRAVENOUS | Status: AC
  Filled 2013-08-19: qty 20

## 2013-08-19 MED ORDER — NEOSTIGMINE METHYLSULFATE 1 MG/ML IJ SOLN
INTRAMUSCULAR | Status: AC
  Filled 2013-08-19: qty 10

## 2013-08-19 MED ORDER — ONDANSETRON HCL 4 MG/2ML IJ SOLN: 4.0000 mg | Freq: Four times a day (QID) | INTRAMUSCULAR | Status: DC | PRN

## 2013-08-19 MED ORDER — DIPHENHYDRAMINE HCL 12.5 MG/5ML PO ELIX: 12.5000 mg | ORAL_SOLUTION | ORAL | Status: DC | PRN

## 2013-08-19 MED ORDER — MORPHINE SULFATE 10 MG/ML IJ SOLN
INTRAMUSCULAR | Status: AC
  Filled 2013-08-19: qty 1

## 2013-08-19 MED ORDER — BUPIVACAINE-EPINEPHRINE (PF) 0.5% -1:200000 IJ SOLN
INTRAMUSCULAR | Status: AC
  Filled 2013-08-19: qty 10

## 2013-08-19 MED ORDER — LIDOCAINE HCL (CARDIAC) 20 MG/ML IV SOLN
INTRAVENOUS | Status: DC | PRN
  Administered 2013-08-19: 40 mg via INTRAVENOUS

## 2013-08-19 MED ORDER — LACTATED RINGERS IV SOLN
INTRAVENOUS | Status: DC
  Administered 2013-08-19: 10 mL/h via INTRAVENOUS

## 2013-08-19 MED ORDER — METOCLOPRAMIDE HCL 5 MG/ML IJ SOLN: 5.0000 mg | Freq: Three times a day (TID) | INTRAMUSCULAR | Status: DC | PRN

## 2013-08-19 MED ORDER — DOCUSATE SODIUM 100 MG PO CAPS
100.0000 mg | ORAL_CAPSULE | Freq: Two times a day (BID) | ORAL | Status: DC
  Administered 2013-08-20 – 2013-08-22 (×5): 100 mg via ORAL
  Filled 2013-08-19 (×8): qty 1

## 2013-08-19 MED ORDER — BISACODYL 5 MG PO TBEC: 5.0000 mg | DELAYED_RELEASE_TABLET | Freq: Every day | ORAL | Status: DC | PRN

## 2013-08-19 MED ORDER — CARVEDILOL 25 MG PO TABS
25.0000 mg | ORAL_TABLET | Freq: Two times a day (BID) | ORAL | Status: DC
  Administered 2013-08-19 – 2013-08-22 (×6): 25 mg via ORAL
  Filled 2013-08-19 (×8): qty 1

## 2013-08-19 MED ORDER — MIDAZOLAM HCL 2 MG/2ML IJ SOLN
INTRAMUSCULAR | Status: AC
  Filled 2013-08-19: qty 2

## 2013-08-19 MED ORDER — PHENYLEPHRINE HCL 10 MG/ML IJ SOLN
INTRAMUSCULAR | Status: AC
  Filled 2013-08-19: qty 1

## 2013-08-19 MED ORDER — INSULIN ASPART 100 UNIT/ML ~~LOC~~ SOLN
0.0000 [IU] | Freq: Three times a day (TID) | SUBCUTANEOUS | Status: DC
  Administered 2013-08-19: 3 [IU] via SUBCUTANEOUS
  Administered 2013-08-20: 15 [IU] via SUBCUTANEOUS
  Administered 2013-08-20: 2 [IU] via SUBCUTANEOUS
  Administered 2013-08-21: 3 [IU] via SUBCUTANEOUS
  Administered 2013-08-21: 11 [IU] via SUBCUTANEOUS
  Administered 2013-08-21: 8 [IU] via SUBCUTANEOUS
  Administered 2013-08-22 (×2): 5 [IU] via SUBCUTANEOUS

## 2013-08-19 MED ORDER — HYDROMORPHONE HCL PF 1 MG/ML IJ SOLN
INTRAMUSCULAR | Status: AC
  Administered 2013-08-19: 13:00:00
  Filled 2013-08-19: qty 1

## 2013-08-19 MED ORDER — METHOCARBAMOL 100 MG/ML IJ SOLN
500.0000 mg | Freq: Four times a day (QID) | INTRAVENOUS | Status: DC | PRN
  Filled 2013-08-19: qty 5

## 2013-08-19 MED ORDER — METHOCARBAMOL 500 MG PO TABS
500.0000 mg | ORAL_TABLET | Freq: Four times a day (QID) | ORAL | Status: DC | PRN
  Administered 2013-08-19 – 2013-08-21 (×3): 500 mg via ORAL
  Filled 2013-08-19 (×4): qty 1

## 2013-08-19 MED ORDER — NEOSTIGMINE METHYLSULFATE 1 MG/ML IJ SOLN
INTRAMUSCULAR | Status: DC | PRN
  Administered 2013-08-19: 3 mg via INTRAVENOUS

## 2013-08-19 MED ORDER — OXYCODONE HCL 5 MG PO TABS
5.0000 mg | ORAL_TABLET | ORAL | Status: DC | PRN
  Administered 2013-08-19 – 2013-08-21 (×8): 10 mg via ORAL
  Filled 2013-08-19 (×8): qty 2

## 2013-08-19 MED ORDER — KCL IN DEXTROSE-NACL 20-5-0.45 MEQ/L-%-% IV SOLN
INTRAVENOUS | Status: DC
  Administered 2013-08-19: 125 mL/h via INTRAVENOUS
  Administered 2013-08-20 (×2): via INTRAVENOUS
  Filled 2013-08-19 (×12): qty 1000

## 2013-08-19 MED ORDER — HYDROMORPHONE HCL PF 1 MG/ML IJ SOLN
1.0000 mg | INTRAMUSCULAR | Status: DC | PRN
  Administered 2013-08-19 (×2): 1 mg via INTRAVENOUS
  Filled 2013-08-19 (×2): qty 1

## 2013-08-19 MED ORDER — ACETAMINOPHEN 325 MG PO TABS
650.0000 mg | ORAL_TABLET | Freq: Four times a day (QID) | ORAL | Status: DC | PRN
  Administered 2013-08-21: 650 mg via ORAL
  Filled 2013-08-19: qty 2

## 2013-08-19 MED ORDER — ONDANSETRON HCL 4 MG/2ML IJ SOLN
INTRAMUSCULAR | Status: DC | PRN
  Administered 2013-08-19: 4 mg via INTRAVENOUS

## 2013-08-19 MED ORDER — FENTANYL CITRATE 0.05 MG/ML IJ SOLN
INTRAMUSCULAR | Status: AC
  Filled 2013-08-19: qty 5

## 2013-08-19 MED ORDER — GLYCOPYRROLATE 0.2 MG/ML IJ SOLN
INTRAMUSCULAR | Status: DC | PRN
  Administered 2013-08-19: 0.4 mg via INTRAVENOUS

## 2013-08-19 MED ORDER — ARTIFICIAL TEARS OP OINT
TOPICAL_OINTMENT | OPHTHALMIC | Status: DC | PRN
  Administered 2013-08-19: 1 via OPHTHALMIC

## 2013-08-19 MED ORDER — ARTIFICIAL TEARS OP OINT
TOPICAL_OINTMENT | OPHTHALMIC | Status: AC
  Filled 2013-08-19: qty 3.5

## 2013-08-19 MED ORDER — SENNOSIDES-DOCUSATE SODIUM 8.6-50 MG PO TABS: 1.0000 | ORAL_TABLET | Freq: Every evening | ORAL | Status: DC | PRN

## 2013-08-19 MED ORDER — CEFAZOLIN SODIUM-DEXTROSE 2-3 GM-% IV SOLR
INTRAVENOUS | Status: AC
  Administered 2013-08-19: 2 g via INTRAVENOUS
  Filled 2013-08-19: qty 50

## 2013-08-19 SURGICAL SUPPLY — 52 items
BLADE SAW SGTL 18X1.27X75 (BLADE) ×2 IMPLANT
BRUSH FEMORAL CANAL (MISCELLANEOUS) IMPLANT
CAPT HIP PF COP ×1 IMPLANT
COVER BACK TABLE 24X17X13 BIG (DRAPES) IMPLANT
COVER SURGICAL LIGHT HANDLE (MISCELLANEOUS) ×4 IMPLANT
DRAPE ORTHO SPLIT 77X108 STRL (DRAPES) ×2
DRAPE PROXIMA HALF (DRAPES) ×2 IMPLANT
DRAPE SURG ORHT 6 SPLT 77X108 (DRAPES) ×1 IMPLANT
DRAPE U-SHAPE 47X51 STRL (DRAPES) ×2 IMPLANT
DRILL BIT 7/64X5 (BIT) ×2 IMPLANT
DRSG AQUACEL AG ADV 3.5X10 (GAUZE/BANDAGES/DRESSINGS) ×2 IMPLANT
DURAPREP 26ML APPLICATOR (WOUND CARE) ×2 IMPLANT
ELECT BLADE 4.0 EZ CLEAN MEGAD (MISCELLANEOUS)
ELECT REM PT RETURN 9FT ADLT (ELECTROSURGICAL) ×2
ELECTRODE BLDE 4.0 EZ CLN MEGD (MISCELLANEOUS) IMPLANT
ELECTRODE REM PT RTRN 9FT ADLT (ELECTROSURGICAL) ×1 IMPLANT
GAUZE XEROFORM 1X8 LF (GAUZE/BANDAGES/DRESSINGS) ×2 IMPLANT
GLOVE BIO SURGEON STRL SZ7.5 (GLOVE) ×2 IMPLANT
GLOVE BIO SURGEON STRL SZ8.5 (GLOVE) ×4 IMPLANT
GLOVE BIOGEL PI IND STRL 8 (GLOVE) ×2 IMPLANT
GLOVE BIOGEL PI IND STRL 9 (GLOVE) ×1 IMPLANT
GLOVE BIOGEL PI INDICATOR 8 (GLOVE) ×2
GLOVE BIOGEL PI INDICATOR 9 (GLOVE) ×1
GOWN STRL REUS W/ TWL LRG LVL3 (GOWN DISPOSABLE) ×2 IMPLANT
GOWN STRL REUS W/ TWL XL LVL3 (GOWN DISPOSABLE) ×3 IMPLANT
GOWN STRL REUS W/TWL LRG LVL3 (GOWN DISPOSABLE) ×4
GOWN STRL REUS W/TWL XL LVL3 (GOWN DISPOSABLE) ×6
HANDPIECE INTERPULSE COAX TIP (DISPOSABLE)
HOOD PEEL AWAY FACE SHEILD DIS (HOOD) ×4 IMPLANT
KIT BASIN OR (CUSTOM PROCEDURE TRAY) ×2 IMPLANT
KIT ROOM TURNOVER OR (KITS) ×2 IMPLANT
MANIFOLD NEPTUNE II (INSTRUMENTS) ×2 IMPLANT
NEEDLE 22X1 1/2 (OR ONLY) (NEEDLE) ×2 IMPLANT
NS IRRIG 1000ML POUR BTL (IV SOLUTION) ×2 IMPLANT
PACK TOTAL JOINT (CUSTOM PROCEDURE TRAY) ×2 IMPLANT
PAD ARMBOARD 7.5X6 YLW CONV (MISCELLANEOUS) ×4 IMPLANT
PASSER SUT SWANSON 36MM LOOP (INSTRUMENTS) ×2 IMPLANT
PRESSURIZER FEMORAL UNIV (MISCELLANEOUS) IMPLANT
SET HNDPC FAN SPRY TIP SCT (DISPOSABLE) IMPLANT
SUT ETHIBOND 2 V 37 (SUTURE) ×2 IMPLANT
SUT VIC AB 0 CTB1 27 (SUTURE) ×2 IMPLANT
SUT VIC AB 1 CTX 36 (SUTURE) ×2
SUT VIC AB 1 CTX36XBRD ANBCTR (SUTURE) ×1 IMPLANT
SUT VIC AB 2-0 CTB1 (SUTURE) ×2 IMPLANT
SUT VIC AB 3-0 SH 27 (SUTURE) ×2
SUT VIC AB 3-0 SH 27X BRD (SUTURE) ×1 IMPLANT
SYR CONTROL 10ML LL (SYRINGE) ×2 IMPLANT
TOWEL OR 17X24 6PK STRL BLUE (TOWEL DISPOSABLE) ×2 IMPLANT
TOWEL OR 17X26 10 PK STRL BLUE (TOWEL DISPOSABLE) ×2 IMPLANT
TOWER CARTRIDGE SMART MIX (DISPOSABLE) IMPLANT
TRAY FOLEY CATH 14FR (SET/KITS/TRAYS/PACK) IMPLANT
WATER STERILE IRR 1000ML POUR (IV SOLUTION) ×8 IMPLANT

## 2013-08-19 NOTE — Anesthesia Postprocedure Evaluation (Signed)
  Anesthesia Post-op Note  Patient: [name]  Procedure(s) Performed: Procedure(s): TOTAL HIP ARTHROPLASTY (Left)  Patient Location: PACU  Anesthesia Type:General  Level of Consciousness: awake, alert  and oriented  Airway and Oxygen Therapy: Patient Spontanous Breathing and Patient connected to nasal cannula oxygen  Post-op Pain: mild  Post-op Assessment: Post-op Vital signs reviewed and Patient's Cardiovascular Status Stable  Post-op Vital Signs: stable  Last Vitals:  Filed Vitals:   08/19/13 1147  BP:   Pulse:   Temp: 36.3 C  Resp:     Complications: No apparent anesthesia complications

## 2013-08-19 NOTE — Progress Notes (Signed)
Dr Zenia Resides office called and fax request sent for last office visit.

## 2013-08-19 NOTE — Progress Notes (Signed)
Utilization review completed.  

## 2013-08-19 NOTE — Progress Notes (Addendum)
Dr Zenia Resides office called for last office visit.Sates to send fax request over.

## 2013-08-19 NOTE — Interval H&P Note (Signed)
History and Physical Interval Note:  08/19/2013 8:46 AM  Lonia Blood  has presented today for surgery, with the diagnosis of OSTEOARTHRITIS LEFT HIP  The various methods of treatment have been discussed with the patient and family. After consideration of risks, benefits and other options for treatment, the patient has consented to  Procedure(s): TOTAL HIP ARTHROPLASTY (Left) as a surgical intervention .  The patient's history has been reviewed, patient examined, no change in status, stable for surgery.  I have reviewed the patient's chart and labs.  Questions were answered to the patient's satisfaction.     Kerin Salen

## 2013-08-19 NOTE — Op Note (Signed)
OPERATIVE REPORT    DATE OF PROCEDURE:  08/19/2013       PREOPERATIVE DIAGNOSIS:  OSTEOARTHRITIS LEFT HIP                                                          POSTOPERATIVE DIAGNOSIS:  OSTEOARTHRITIS LEFT HIP                                                           PROCEDURE:  L total hip arthroplasty using a 52 mm DePuy Pinnacle  Cup, [employer], 10-degree polyethylene liner index superior  and posterior, a +0 36 mm ceramic head, a 20x15x36x150 SROM stem, 20Fsm Sleeve   SURGEON: Kerin Salen    ASSISTANT:   Kerry Hough. [employer]  (present throughout entire procedure and necessary for timely completion of the procedure)   ANESTHESIA: General BLOOD LOSS: 200 FLUID REPLACEMENT: 1500 crystalloid DRAINS: Foley Catheter URINE OUTPUT: 0000000 COMPLICATIONS: none    INDICATIONS FOR PROCEDURE: A 58 y.o. year-old With  [place]   for 5 years, x-rays show bone-on-bone arthritic changes. Despite conservative measures with observation, anti-inflammatory medicine, narcotics, use of a cane, has severe unremitting pain and can ambulate only a few blocks before resting.  Patient desires elective L total hip arthroplasty to decrease pain and increase function. The risks, benefits, and alternatives were discussed at length including but not limited to the risks of infection, bleeding, nerve injury, stiffness, blood clots, the need for revision surgery, cardiopulmonary complications, among others, and they were willing to proceed. Questions answered     PROCEDURE IN DETAIL: The patient was identified by armband,  received preoperative IV antibiotics in the holding area at [facility], taken to the operating room , appropriate anesthetic monitors  were attached and general endotracheal anesthesia induced. Foley catheter was inserted. Pt was rolled into the R lateral decubitus position and fixed there with a Stulberg Mark II pelvic clamp.  The L lower extremity was  then prepped and draped  in the usual sterile fashion from the ankle to the hemipelvis. A time-out  procedure was performed. The skin along the lateral hip and thigh  infiltrated with 10 mL of 0.5% Marcaine and epinephrine solution. We  then made a posterolateral approach to the hip. With a #10 blade, a 17 cm  incision was made through the skin and subcutaneous tissue down to the level of the  IT band. Small bleeders were identified and cauterized. The IT band was cut in  line with skin incision exposing the greater trochanter. A Cobra retractor was placed between the gluteus minimus and the superior hip joint capsule, and a spiked Cobra between the quadratus femoris and the inferior hip joint capsule. This isolated the short  external rotators and piriformis tendons. These were tagged with a #2 Ethibond  suture and cut off their insertion on the intertrochanteric crest. The posterior  capsule was then developed into an acetabular-based flap from Posterior Superior off of the acetabulum out over the femoral neck and back posterior inferior to the acetabular rim. This flap was tagged with two #2  Ethibond sutures and retracted protecting the sciatic nerve. This exposed the arthritic femoral head and osteophytes. The hip was then flexed and internally rotated, dislocating the femoral head and a standard neck cut performed 1 fingerbreadth above the lesser trochanter.  A spiked Cobra was placed in the cotyloid notch and a Hohmann retractor was then used to lever the femur anteriorly off of the anterior pelvic column. A posterior-inferior wing retractor was placed at the junction of the acetabulum and the ischium completing the acetabular exposure.We then removed the peripheral osteophytes and labrum from the acetabulum. We then reamed the acetabulum up to 51 mm with basket reamers obtaining good coverage in all quadrants. We then irrigated with normal  saline solution and hammered into place a 52 mm pinnacle  cup in 45  degrees of abduction and about 20 degrees of anteversion. More  peripheral osteophytes removed and a trial 10-degree liner placed with the  index superior-posterior. The hip was then flexed and internally rotated exposing the  proximal femur, which was entered with the initiating reamer followed by  the axial reamers up to a 15.5 mm full depth and 65mm partial depth. We then conically reamed to 41F to the correct depth for a 42 base neck. The calcar was milled to 41Fsm. A trial cone and stem was inserted in the 25 degrees anteversion, with a +0 9mm trial head. Trial reduction was then performed and required a +36 neck, excellent stability was noted with at 90 of flexion with 75 of internal rotation and then full extension with maximal external rotation. The hip could not be dislocated in full extension. The knee could easily flex  to about 130 degrees. We also stretched the abductors at this point,  because of the preexisting adductor contractures. All trial components  were then removed. The acetabulum was irrigated out with normal saline  solution. A titanium Apex [facility] was then screwed into place  followed by a 10-degree polyethylene liner index superior-posterior. On  the femoral side a 41Fsm ZTT1 sleeve was hammered into place, followed by a 20x15x36x160 SROM stem in 25 degrees of anteversion. At this point, a +0 36 mm ceramic head was  hammered on the stem. The hip was reduced. We checked our stability  one more time and found it to be excellent. The wound was once again  thoroughly irrigated out with normal saline solution pulse lavage. The  capsular flap and short external rotators were repaired back to the  intertrochanteric crest through drill holes with a #2 Ethibond suture.  The IT band was closed with running 1 Vicryl suture. The subcutaneous  tissue with 0 and 2-0 undyed Vicryl suture and the skin with running  interlocking 3-0 nylon suture. Dressing of Xeroform  and Mepilex was  then applied. The patient was then unclamped, rolled supine, awaken extubated and taken to recovery room without difficulty in stable condition.   Kerin Salen 08/19/2013, 11:10 AM

## 2013-08-19 NOTE — Transfer of Care (Signed)
Immediate Anesthesia Transfer of Care Note  Patient: [name]  Procedure(s) Performed: Procedure(s): TOTAL HIP ARTHROPLASTY (Left)  Patient Location: PACU  Anesthesia Type:General  Level of Consciousness: awake, alert , oriented and patient cooperative  Airway & Oxygen Therapy: Patient Spontanous Breathing and Patient connected to nasal cannula oxygen  Post-op Assessment: Report given to PACU RN and Post -op Vital signs reviewed and stable  Post vital signs: Reviewed and stable  Complications: No apparent anesthesia complications

## 2013-08-19 NOTE — Anesthesia Preprocedure Evaluation (Addendum)
Anesthesia Evaluation  Patient identified by MRN, date of birth, ID band Patient awake    Reviewed: Allergy & Precautions, H&P , NPO status , Patient's Chart, lab work & pertinent test results  Airway Mallampati: II TM Distance: >3 FB Neck ROM: Full    Dental  (+) Teeth Intact, Poor Dentition, Dental Advisory Given   Pulmonary    Pulmonary exam normal       Cardiovascular hypertension, Pt. on medications and Pt. on home beta blockers + Peripheral Vascular Disease and +CHF Rate:Normal  07-07-13 2D ECHO Left ventricle:  The cavity size was normal. Wall thickness was increased in a pattern of moderate LVH. Systolic function was moderately to severely reduced. The estimated ejection fraction was in the range of 30% to 35%. Diffuse hypokinesis.  Regional wall motion abnormalities:   Moderate hypokinesis. Early diastolic septal annular tissue Doppler  06-Jul-2013 21:35:17 [facility]  Sinus rhythm with Premature atrial complexes Left ventricular hypertrophy with repolarization abnormality  velocities Ea were abnormal. Features are consistent with a pseudonormal left ventricular filling pattern, with concomitant abnormal relaxation and increased filling pressure (grade 2 diastolic dysfunction).   Neuro/Psych    GI/Hepatic   Endo/Other  diabetes, Poorly Controlled, Type 2, Oral Hypoglycemic Agents  Renal/GU Renal InsufficiencyRenal disease     Musculoskeletal  (+) Arthritis -, Osteoarthritis,    Abdominal   Peds  Hematology  (+) anemia ,   Anesthesia Other Findings   Reproductive/Obstetrics                      Anesthesia Physical Anesthesia Plan  ASA: III  Anesthesia Plan: General   Post-op Pain Management:    Induction: Intravenous  Airway Management Planned: Oral ETT  Additional Equipment:   Intra-op Plan:   Post-operative Plan: Extubation in OR  Informed Consent:   Plan  Discussed with:   Anesthesia Plan Comments:         Anesthesia Quick Evaluation

## 2013-08-19 NOTE — Progress Notes (Signed)
Dr Zenia Resides office called with request for last office visit. Office staff states that they will remind the Dr.

## 2013-08-20 ENCOUNTER — Encounter (HOSPITAL_COMMUNITY): Payer: Self-pay | Admitting: Orthopedic Surgery

## 2013-08-20 ENCOUNTER — Encounter: Payer: No Typology Code available for payment source | Attending: Internal Medicine

## 2013-08-20 LAB — CBC
HCT: 27.7 % — ABNORMAL LOW (ref 36.0–46.0)
Hemoglobin: 9 g/dL — ABNORMAL LOW (ref 12.0–15.0)
MCH: 28 pg (ref 26.0–34.0)
MCHC: 32.5 g/dL (ref 30.0–36.0)
MCV: 86.3 fL (ref 78.0–100.0)
Platelets: 223 10*3/uL (ref 150–400)
RBC: 3.21 MIL/uL — ABNORMAL LOW (ref 3.87–5.11)
RDW: 14.8 % (ref 11.5–15.5)
WBC: 8.1 10*3/uL (ref 4.0–10.5)

## 2013-08-20 LAB — BASIC METABOLIC PANEL
BUN: 26 mg/dL — ABNORMAL HIGH (ref 6–23)
CO2: 21 mEq/L (ref 19–32)
CREATININE: 1.35 mg/dL — AB (ref 0.50–1.10)
Calcium: 8.6 mg/dL (ref 8.4–10.5)
Chloride: 99 mEq/L (ref 96–112)
GFR calc Af Amer: 50 mL/min — ABNORMAL LOW (ref >90)
GFR, EST NON AFRICAN AMERICAN: 43 mL/min — AB (ref >90)
GLUCOSE: 340 mg/dL — AB (ref 70–99)
Potassium: 5.3 mEq/L (ref 3.7–5.3)
SODIUM: 133 meq/L — AB (ref 137–147)

## 2013-08-20 LAB — GLUCOSE, CAPILLARY
GLUCOSE-CAPILLARY: 99 mg/dL (ref 70–99)
Glucose-Capillary: 363 mg/dL — ABNORMAL HIGH (ref 70–99)
Glucose-Capillary: 145 mg/dL — ABNORMAL HIGH (ref 70–99)

## 2013-08-20 NOTE — Progress Notes (Addendum)
Pt has only voided 30 mL since foley removal from 0700 this morning. Previous shift RN performed bladder scan which showed 425 mL urine in bladder at 1900. Pt requested to use bedside commode at this time in order to avoid in and out cath, but was unable to void. Educated pt on reason/need to perform in and out cath. Pt compliant with this intervention. 500 mL urine removed from bladder at this time. IV previously saline locked. Fluids restarted at [facility]. PO fluids encouraged. Nursing will continue to monitor.

## 2013-08-20 NOTE — Evaluation (Signed)
Physical Therapy Evaluation Patient Details Name: [name] MRN: [id number] DOB: 1955/08/23 Today's Date: 08/20/2013   History of Present Illness  Pt is s/r Left posterior THR.  Clinical Impression  Pt presents with dependencies in mobility and Independence secondary to L THR. Pt is limited today due to significant pain. Pt agreeable to work with therapy. Pt reported she may not be able to count on her family to assist her at home. Discussed the possibility of SNF placement if patient is slow to progress. Right now my recommendation is for D/C home with HHPT and PT to reassess d/c recommendation on the next visit. Pt would benefit from continued skilled PT in the acute setting to focus on increasing independence with mobility.    Follow Up Recommendations Home health PT    Equipment Recommendations  Rolling walker with 5" wheels    Recommendations for Other Services OT consult     Precautions / Restrictions Precautions Precautions: Posterior Hip Precaution Booklet Issued: Yes (comment) Precaution Comments: Reviewed Posterior THP. Pt distracted by pain so will need reinforcement. Restrictions LLE Weight Bearing: Weight bearing as tolerated      Mobility  Bed Mobility Overal bed mobility: Needs Assistance Bed Mobility: Supine to Sit     Supine to sit: Mod assist     General bed mobility comments: cues for technique, multiple breaks to recover from pain with movement, cues to look at therapist to redirect attention and calm pt down with pursed lip breathing. therapist assisted with movement of L LE.  Transfers Overall transfer level: Needs assistance Equipment used: Rolling walker (2 wheeled) Transfers: Sit to/from [facility] Sit to Stand: Mod assist Stand pivot transfers: Min assist       General transfer comment: cues for hand placement for improved safety and independence. cues for sequencing with RW for pivot transfer to  chair.  Ambulation/Gait                Stairs            Wheelchair Mobility    Modified Rankin (Stroke Patients Only)       Balance Overall balance assessment: Needs assistance   Sitting balance-Leahy Scale: Good     Standing balance support: Bilateral upper extremity supported Standing balance-Leahy Scale: Fair                               Pertinent Vitals/Pain     Home Living Family/patient expects to be discharged to:: Private residence Living Arrangements: Spouse/significant other;Children Available Help at Discharge: Available PRN/intermittently Type of Home: House Home Access: Level entry     Home Layout: One level Home Equipment: Bedside commode;Walker - 2 wheels Additional Comments: has a RW, but pt reports she needs a better one.    Prior Function Level of Independence: Independent               Hand Dominance        Extremity/Trunk Assessment   Upper Extremity Assessment: Defer to OT evaluation           Lower Extremity Assessment: LLE deficits/detail   LLE Deficits / Details: significant pain limiting LE movement during functional mobility. strength 2/5 grossly  Cervical / Trunk Assessment: Normal  Communication   Communication: No difficulties  Cognition Arousal/Alertness: Awake/alert Behavior During Therapy: Restless (due to pain) Overall Cognitive Status: Within Functional Limits for tasks assessed  General Comments      Exercises Total Joint Exercises Ankle Circles/Pumps: AROM;Strengthening;Left;10 reps;Supine      Assessment/Plan    PT Assessment Patient needs continued PT services  PT Diagnosis Difficulty walking;Acute pain   PT Problem List Decreased strength;Decreased activity tolerance;Decreased balance;Decreased mobility;Decreased knowledge of use of DME;Decreased safety awareness;Decreased knowledge of precautions;Pain  PT Treatment Interventions DME  instruction;Gait training;Functional mobility training;Therapeutic activities;Therapeutic exercise;Balance training;Patient/family education   PT Goals (Current goals can be found in the Care Plan section) Acute Rehab PT Goals Patient Stated Goal: To have less pain and go home. PT Goal Formulation: With patient Time For Goal Achievement: 08/27/13 Potential to Achieve Goals: Good    Frequency 7X/week   Barriers to discharge        Co-evaluation               End of Session Equipment Utilized During Treatment: Gait belt Activity Tolerance: Patient limited by pain Patient left: in chair;with call bell/phone within reach;with family/visitor present Nurse Communication: Mobility status         Time: [id number] PT Time Calculation (min): 29 min   Charges:   PT Evaluation $Initial PT Evaluation Tier I: 1 Procedure PT Treatments $Therapeutic Activity: 23-37 mins   PT G Codes:          Lelon Mast 08/20/2013, 10:17 AM

## 2013-08-20 NOTE — Progress Notes (Signed)
Inpatient Diabetes Program Recommendations  AACE/ADA: New Consensus Statement on Inpatient Glycemic Control (2013)  Target Ranges:  Prepandial:   less than 140 mg/dL      Peak postprandial:   less than 180 mg/dL (1-2 hours)      Critically ill patients:  140 - 180 mg/dL   Reason for Visit: Hyperglycemia  Diabetes history: Type 2 Outpatient Diabetes medications: Glucotrol 5 mg BID Current orders for Inpatient glycemic control: Glucotrol 5 mg BID, Moderate Novolog correction tid  Results for MIXTLI, TOWLES (MRN [id number]) as of 08/20/2013 10:48  Ref. Range 08/19/2013 08:21 08/19/2013 12:07 08/19/2013 16:16 08/19/2013 21:53 08/20/2013 06:47  Glucose-Capillary Latest Range: 70-99 mg/dL 191 (H) 223 (H) 182 (H) 172 (H) 363 (H)   Note:  A1C is 9.3 indicating mean glucose of 220 mg/dl prior to admission.  (Previous A1C was 7.5 on 07/17/13).  Brief visit with patient in room.  Patient states she has just had pain medication and obviously trying to rest.   States she was supposed to go to classes at [facility], but had to cancel because of surgery.  States she needs to call to reschedule as soon as she can.  When asked regarding the name of her PCP, states that she needs another one in the [facility].  Told her I would request a Care Management consult regarding this.  CBG's sub-optimal.  Request MD consider adding Lantus 15 units either daily or at [facility] and consider consulting a Hospitalist regarding glucose management.  Also needs to follow-up with a medical physician as soon as possible after discharge regarding glycemic control.  May need to take insulin at discharge to manage diabetes.  Thank you.  Konni Kesinger S. Marcelline Mates, RN, MSN, CDE Inpatient Diabetes Program, team pager [phone]

## 2013-08-20 NOTE — Plan of Care (Signed)
Problem: Consults Goal: Diagnosis- Total Joint Replacement Outcome: Completed/Met Date Met:  08/20/13 Primary Total Hip Left

## 2013-08-20 NOTE — Progress Notes (Signed)
Patient ID: [name], female   DOB: 08-17-1955, 58 y.o.   MRN: [id number] PATIENT ID: [name]  MRN: [id number]  DOB/AGE:  22-Mar-1956 / 58 y.o.  1 Day Post-Op Procedure(s) (LRB): TOTAL HIP ARTHROPLASTY (Left)    PROGRESS NOTE Subjective: Patient is alert, oriented,no Nausea, no Vomiting, yes passing gas, no Bowel Movement. Taking PO well. Denies SOB, Chest or Calf Pain. Using Incentive Spirometer, PAS in place. Ambulate WBAT in room Patient reports pain as 3 on 0-10 scale  .    Objective: Vital signs in last 24 hours: Filed Vitals:   08/19/13 2336 08/20/13 0000 08/20/13 0400 08/20/13 0524  BP: 126/54   121/47  Pulse: 88   88  Temp:    99.2 F (37.3 C)  TempSrc:      Resp:  16 16 16   Height:      Weight:      SpO2:  98% 100% 94%      Intake/Output from previous day: I/O last 3 completed shifts: In: [id number] [P.O.:240; I.V.:1300] Out: 525 [Urine:325; Blood:200]   Intake/Output this shift: Total I/O In: -  Out: 350 [Urine:350]   LABORATORY DATA:  Recent Labs  08/19/13 1207 08/19/13 1616 08/19/13 2153 08/20/13 0555  WBC  --   --   --  8.1  HGB  --   --   --  9.0*  HCT  --   --   --  27.7*  PLT  --   --   --  223  GLUCAP 223* 182* 172*  --     Examination: Neurologically intact ABD soft Neurovascular intact Sensation intact distally Intact pulses distally Dorsiflexion/Plantar flexion intact Incision: no drainage No cellulitis present Compartment soft} XR AP&Lat of hip shows well placed\fixed THA  Assessment:   1 Day Post-Op Procedure(s) (LRB): TOTAL HIP ARTHROPLASTY (Left) ADDITIONAL DIAGNOSIS:  Diabetes, Hypertension, Renal Insufficiency Chronic and PVD, CAD, CHF  Plan: PT/OT WBAT, THA  posterior precautions  DVT Prophylaxis: SCDx72 hrs, ASA 325 mg BID x 2 weeks  DISCHARGE PLAN: Home  DISCHARGE NEEDS: HHPT, HHRN, CPM, Walker and 3-in-1 comode seat

## 2013-08-20 NOTE — Evaluation (Signed)
Occupational Therapy Evaluation Patient Details Name: [name] MRN: [id number] DOB: 11/16/1955 Today's Date: 08/20/2013    History of Present Illness Pt is s/p Left posterior THR.   Clinical Impression   Pt is a 58 y/o female s/p Left posterior THR w/ pain as a limiting factor. She demonstrates deficits in areas of ADL, functional mobility and transfers related to this. She was educated in Role of OT and should benefit from acute rehab to assist in maximizing independence w/ ADL's prior to returning home w/ PRN family assist.     Follow Up Recommendations  Home health OT;Supervision/Assistance - 24 hour    Equipment Recommendations  Other (comment) (Cont to assess in functional context)    Recommendations for Other Services       Precautions / Restrictions Precautions Precautions: Posterior Hip Precaution Booklet Issued: Yes (comment) Precaution Comments: Reviewed Posterior THP. Pt distracted by pain so will need reinforcement. Restrictions Weight Bearing Restrictions: Yes LLE Weight Bearing: Weight bearing as tolerated      Mobility Bed Mobility Overal bed mobility: Needs Assistance Bed Mobility: Sit to Supine     Supine to sit: Mod assist Sit to supine: Mod assist   General bed mobility comments: cues for technique, multiple breaks to recover from pain with movement, therapist assisted with movement of L LE.  Transfers Overall transfer level: Needs assistance Equipment used: Rolling walker (2 wheeled) Transfers: Sit to/from [facility] Sit to Stand: Min assist Stand pivot transfers: Min assist       General transfer comment: cues for hand placement for improved safety and independence. cues for sequencing with RW for transfer from chair back to bed.    Balance Overall balance assessment: Needs assistance Sitting-balance support: Feet supported Sitting balance-Leahy Scale: Good     Standing balance support: Bilateral upper extremity  supported Standing balance-Leahy Scale: Fair                              ADL Overall ADL's : Needs assistance/impaired     Grooming: Wash/dry hands;Wash/dry face;Oral care;Set up;Sitting (Simulated)   Upper Body Bathing: Set up;Sitting (Simulated)   Lower Body Bathing: Minimal assistance;Moderate assistance;Sit to/from stand (Simulated) Lower Body Bathing Details (indicate cue type and reason):  (Pt reports that she required PRN assist from her daughter prior to this surgery, pain currently limiting performance.) Upper Body Dressing : Set up;Sitting   Lower Body Dressing: Adhering to hip precautions;Sit to/from stand;Minimal assistance;Moderate assistance   Toilet Transfer: Minimal assistance;BSC;Cueing for sequencing;Adhering to hip precautions;RW;Cueing for safety (Simulated toilet transfer (transferred from chair took multiple steps w/ RW to EOB.)   Toileting- Clothing Manipulation and Hygiene: Minimal assistance;Sit to/from stand;Adhering to hip precautions       Functional mobility during ADLs: Minimal assistance;Cueing for safety;Cueing for sequencing;Rolling walker General ADL Comments:  (Pt overall min-mod A LB ADL's. pt reports that she required PRN A from her daughter prior to this surgery and should benefit from a/e education to assist with increased independence.)     Vision  No changes from baseline per pt report.                   Perception     Praxis      Pertinent Vitals/Pain Pt reports 9/10 L hip pain, and states that RN gave medication prior to OT arrival. Repositioned in bed and resting after assessment.     Hand Dominance Left   Extremity/Trunk  Assessment Upper Extremity Assessment Upper Extremity Assessment: Overall WFL for tasks assessed   Lower Extremity Assessment Lower Extremity Assessment: Defer to PT evaluation LLE Deficits / Details: significant pain limiting LE movement during functional mobility. strength 2/5  grossly LLE: Unable to fully assess due to pain   Cervical / Trunk Assessment Cervical / Trunk Assessment: Normal   Communication Communication Communication: No difficulties   Cognition Arousal/Alertness: Awake/alert Behavior During Therapy: Restless (due to pain) Overall Cognitive Status: Within Functional Limits for tasks assessed                     General Comments       Exercises       Shoulder Instructions      Home Living Family/patient expects to be discharged to:: Private residence Living Arrangements: Spouse/significant other;Children Available Help at Discharge: Available PRN/intermittently Type of Home: House Home Access: Level entry     Home Layout: One level     Bathroom Shower/Tub: Tub/shower unit Shower/tub characteristics: Architectural technologist: Standard Bathroom Accessibility: Yes How Accessible: Accessible via walker Home Equipment: Bedside commode;Walker - 2 wheels   Additional Comments: has a RW, but pt reports she needs a better one.      Prior Functioning/Environment Level of Independence: Needs assistance  Gait / Transfers Assistance Needed: Pt reports that she used RW at home and amb short distances I'ly, used scooter in stores (when shopping/grocery stores etc). ADL's / Homemaking Assistance Needed: Pt reports that her daughter assisted w/ LB dressing PRN prior to this surgery. Daughter is in school.        OT Diagnosis: Generalized weakness;Acute pain   OT Problem List: Decreased activity tolerance;Decreased knowledge of precautions;Decreased knowledge of use of DME or AE;Pain   OT Treatment/Interventions: Self-care/ADL training;DME and/or AE instruction;Patient/family education;Therapeutic activities    OT Goals(Current goals can be found in the care plan section) Acute Rehab OT Goals Patient Stated Goal: Decreased pain and return home Time For Goal Achievement: 08/27/13 Potential to Achieve Goals: Good  OT Frequency:  Min 2X/week   Barriers to D/C:            Co-evaluation              End of Session Equipment Utilized During Treatment: Gait belt;Rolling walker  Activity Tolerance: Patient limited by pain Patient left: in bed;with call bell/phone within reach;with bed alarm set;with family/visitor present   Time: [id number] OT Time Calculation (min): 21 min Charges:  OT General Charges $OT Visit: 1 Procedure OT Evaluation $Initial OT Evaluation Tier I: 1 Procedure OT Treatments $Therapeutic Activity: 8-22 mins G-Codes:    Amy B Barnhill 05-Sep-2013, 11:21 AM

## 2013-08-21 LAB — CBC
HEMATOCRIT: 25.4 % — AB (ref 36.0–46.0)
HEMOGLOBIN: 8.2 g/dL — AB (ref 12.0–15.0)
MCH: 27.7 pg (ref 26.0–34.0)
MCHC: 32.3 g/dL (ref 30.0–36.0)
MCV: 85.8 fL (ref 78.0–100.0)
Platelets: 190 10*3/uL (ref 150–400)
RBC: 2.96 MIL/uL — AB (ref 3.87–5.11)
RDW: 14.9 % (ref 11.5–15.5)
WBC: 10.4 10*3/uL (ref 4.0–10.5)

## 2013-08-21 LAB — GLUCOSE, CAPILLARY
GLUCOSE-CAPILLARY: 293 mg/dL — AB (ref 70–99)
Glucose-Capillary: 339 mg/dL — ABNORMAL HIGH (ref 70–99)
Glucose-Capillary: 196 mg/dL — ABNORMAL HIGH (ref 70–99)
Glucose-Capillary: 166 mg/dL — ABNORMAL HIGH (ref 70–99)

## 2013-08-21 MED ORDER — ASPIRIN EC 325 MG PO TBEC: 325.0000 mg | DELAYED_RELEASE_TABLET | Freq: Two times a day (BID) | ORAL | Status: DC

## 2013-08-21 MED ORDER — METHOCARBAMOL 500 MG PO TABS: 500.0000 mg | ORAL_TABLET | Freq: Two times a day (BID) | ORAL | Status: DC

## 2013-08-21 MED ORDER — OXYCODONE-ACETAMINOPHEN 5-325 MG PO TABS: 1.0000 | ORAL_TABLET | ORAL | Status: DC | PRN

## 2013-08-21 MED ORDER — HYDROCODONE-ACETAMINOPHEN 5-325 MG PO TABS
1.0000 | ORAL_TABLET | ORAL | Status: DC | PRN
  Administered 2013-08-21: 2 via ORAL
  Administered 2013-08-21: 1 via ORAL
  Administered 2013-08-21: 2 via ORAL
  Filled 2013-08-21: qty 1
  Filled 2013-08-21 (×2): qty 2

## 2013-08-21 NOTE — Care Management Note (Signed)
CARE MANAGEMENT NOTE 08/21/2013  Patient:  [name], [name]   Account Number:  [id number]  Date Initiated:  08/21/2013  Documentation initiated by:  Ricki Miller  Subjective/Objective Assessment:   58 yr old female s/p left total hip arthroplasty.     Action/Plan:   Case manager spoke with patient concerning Home Health needs at discharge. patient preoperatively setup with [facility]. Patient wants SNF, social worker is aware. CM will follow   Anticipated DC Date:     Anticipated DC Plan:           Choice offered to / List presented to:             Status of service:  In process, will continue to follow

## 2013-08-21 NOTE — Progress Notes (Signed)
PATIENT ID: [name]  MRN: [id number]  DOB/AGE:  12-02-1955 / 58 y.o.  2 Days Post-Op Procedure(s) (LRB): TOTAL HIP ARTHROPLASTY (Left)    PROGRESS NOTE Subjective: Patient is alert, oriented,no Nausea, no Vomiting, yes passing gas, no Bowel Movement. Taking PO well. Denies SOB, Chest or Calf Pain. Using Incentive Spirometer, PAS in place. Ambulate WBAT with pt working on sit to stand and moving to seat in room Patient reports pain as 9 on 0-10 scale . Pt has had difficulty voiding and in and out cath was used.  Pt was encouraged to drink fluids.       Objective: Vital signs in last 24 hours: Filed Vitals:   08/20/13 2000 08/21/13 0000 08/21/13 0400 08/21/13 0520  BP:    123/62  Pulse:    103  Temp:    101.1 F (38.4 C)  TempSrc:      Resp: $Remo'18 18 18 18  'FCbsd$ Height:      Weight:      SpO2: 95% 95% 95% 96%      Intake/Output from previous day: I/O last 3 completed shifts: In: 1968.8 [P.O.:300; I.V.:1668.8] Out: 1580 [Urine:1580]   Intake/Output this shift:     LABORATORY DATA:  Recent Labs  08/20/13 0555  08/20/13 1143 08/20/13 1604 08/21/13 0445 08/21/13 0627  WBC 8.1  --   --   --  10.4  --   HGB 9.0*  --   --   --  8.2*  --   HCT 27.7*  --   --   --  25.4*  --   PLT 223  --   --   --  190  --   NA 133*  --   --   --   --   --   K 5.3  --   --   --   --   --   CL 99  --   --   --   --   --   CO2 21  --   --   --   --   --   BUN 26*  --   --   --   --   --   CREATININE 1.35*  --   --   --   --   --   GLUCOSE 340*  --   --   --   --   --   GLUCAP  --   < > 99 145*  --  339*  CALCIUM 8.6  --   --   --   --   --   < > = values in this interval not displayed.  Examination: Neurologically intact Neurovascular intact Sensation intact distally Intact pulses distally Dorsiflexion/Plantar flexion intact Incision: dressing C/D/I and no drainage No cellulitis present Compartment soft} XR AP&Lat of hip shows well placed\fixed THA  Assessment:   2 Days  Post-Op Procedure(s) (LRB): TOTAL HIP ARTHROPLASTY (Left) ADDITIONAL DIAGNOSIS:Diabetes, Hypertension, Renal Insufficiency Chronic and PVD, CAD, CHF  Plan: PT/OT WBAT, THA  posterior precautions  DVT Prophylaxis: SCDx72 hrs, ASA 325 mg BID x 2 weeks  DISCHARGE PLAN: Home when pt able to void and PT goals met  DISCHARGE NEEDS: HHPT, HHRN, Walker and 3-in-1 comode seat

## 2013-08-21 NOTE — Progress Notes (Addendum)
Clinical Social Work Department CLINICAL SOCIAL WORK PLACEMENT NOTE 08/21/2013  Patient:  [name], [name]  Account Number:  [id number] Admit date:  08/19/2013  Clinical Social Worker:  Berton Mount, Latanya Presser  Date/time:  08/21/2013 03:11 PM  Clinical Social Work is seeking post-discharge placement for this patient at the following level of care:   SKILLED NURSING   (*CSW will update this form in Epic as items are completed)   08/21/2013  Patient/family provided with [place] Department of Clinical Social Work's list of facilities offering this level of care within the geographic area requested by the patient (or if unable, by the patient's family).  08/21/2013  Patient/family informed of their freedom to choose among providers that offer the needed level of care, that participate in Medicare, Medicaid or managed care program needed by the patient, have an available bed and are willing to accept the patient.  08/21/2013  Patient/family informed of [facility]' ownership interest in [facility], as well as of the fact that they are under no obligation to receive care at this facility.  PASARR submitted to EDS on 08/21/2013 PASARR number received from EDS on 08/21/2013  FL2 transmitted to all facilities in geographic area requested by pt/family on  08/21/2013 FL2 transmitted to all facilities within larger geographic area on   Patient informed that his/her managed care company has contracts with or will negotiate with  certain facilities, including the following:     Patient/family informed of bed offers received:  08/22/2013 Patient chooses bed at [facility] Physician recommends and patient chooses bed at    Patient to be transferred to [facility] on  08/22/2013 Patient to be transferred to facility by [facility]  The following physician request were entered in Epic:   Additional Comments:   [place], [place]

## 2013-08-21 NOTE — Progress Notes (Signed)
Inpatient Diabetes Program Recommendations  AACE/ADA: New Consensus Statement on Inpatient Glycemic Control (2013)  Target Ranges:  Prepandial:   less than 140 mg/dL      Peak postprandial:   less than 180 mg/dL (1-2 hours)      Critically ill patients:  140 - 180 mg/dL  Inpatient Diabetes Program Recommendations Insulin - Basal: Please consider addiiton of lantus starting with 15 units daily or HS. Also please add the HS correction scale to regimen. Glucose at 0500 this am at 359 mg/dl.  Thank you, Rosita Kea, RN, CNS, Diabetes Coordinator [phone])

## 2013-08-21 NOTE — Progress Notes (Signed)
Clinical Social Work Department BRIEF PSYCHOSOCIAL ASSESSMENT 08/21/2013  Patient:  [name], [name]     Account Number:  [id number]     Admit date:  08/19/2013  Clinical Social Worker:  Adair Laundry  Date/Time:  08/21/2013 03:45 AM  Referred by:  Physician  Date Referred:  08/21/2013 Referred for  SNF Placement   Other Referral:   Interview type:  Patient Other interview type:    PSYCHOSOCIAL DATA Living Status:  HUSBAND Admitted from facility:   Level of care:   Primary support name:  [name] [phone] Primary support relationship to patient:  SPOUSE Degree of support available:   Pt reports having relatively good support    CURRENT CONCERNS Current Concerns  Post-Acute Placement   Other Concerns:    SOCIAL WORK ASSESSMENT / PLAN CSW aware of PT recommendation for [facility] with 24 hr supervision. CSW visited pt room and spoke briefly with pt. During visit, pt was conversating appropriately but was extremely tired and continued to drift in and out of sleep. CSW spoke with pt about recommendation. Pt reports that her husband is home with her however she is not sure he will be able to provide the assistance needed. Pt also had a 57 year old daughter at home. CSW spoke with pt about SNF referral process including insurance authorization. Pt verbalized understanding and informed she would like to dc to facility. CSW explained that CSW would send referral to all of [place], but it is not guaranteed that insurance will provide authorization. Pt was understanding of this.    CSW aware that pt possibly being impacted by medication during conversation. Plan will be to refer pt to SNF today but to also speak with pt about SNF again tomorrow morning in hopes that pt is more alert during conversation.   Assessment/plan status:  Psychosocial Support/Ongoing Assessment of Needs Other assessment/ plan:   Information/referral to community resources:   SNF list to be provided with  bed offers    PATIENT'S/FAMILY'S RESPONSE TO PLAN OF CARE: Pt is wanting to dc to SNF at this time for ST rehab       Sharon, [place]

## 2013-08-21 NOTE — Progress Notes (Signed)
Pt still has not voided since last bladder scan and in and out cath. Bladder scan showed 441 mL urine in bladder. In and out cath performed; 500 mL urine removed. Fluids encouraged. Nursing will continue to monitor.

## 2013-08-21 NOTE — Progress Notes (Addendum)
Physical Therapy Treatment Patient Details Name: [name] MRN: [id number] DOB: 02/28/1956 Today's Date: 08-22-13    History of Present Illness Pt is s/r Left posterior THR.    PT Comments    Pt able to perform multiple SPT today at min-mod A level, continues to be limited by L hip pain, unable to perform gait this session due to pain.  Will need 24 hour physical assistance at d/c, if this is unavailable SNF may be appropriate.  Follow Up Recommendations  Home health PT, SNF     Equipment Recommendations  Rolling walker with 5" wheels    Recommendations for Other Services       Precautions / Restrictions Precautions Precautions: Posterior Hip Precaution Booklet Issued: Yes (comment) Restrictions LLE Weight Bearing: Weight bearing as tolerated    Mobility  Bed Mobility Overal bed mobility: Needs Assistance       Supine to sit: Max assist     General bed mobility comments: needs assist for B LEs and trunk, increased time and cuing throughout  Transfers Overall transfer level: Needs assistance Equipment used: Rolling walker (2 wheeled)   Sit to Stand: Mod assist Stand pivot transfers: Min assist       General transfer comment: pt performed sit to stand with mod A for bed and BSC, SPT x 2 for training with min A, cues for safety with RW use  Ambulation/Gait                 Stairs            Wheelchair Mobility    Modified Rankin (Stroke Patients Only)       Balance     Sitting balance-Leahy Scale: Good       Standing balance-Leahy Scale: Fair Standing balance comment: requires min A for standing to perform hygiene after toileting, cues for maintaining precautions (not bending too far)                    Cognition Arousal/Alertness: Awake/alert Behavior During Therapy: Anxious Overall Cognitive Status: Within Functional Limits for tasks assessed                      Exercises      General Comments        Pertinent Vitals/Pain Pt c/o L hip pain with mobility, eases with rest and repositioning    Home Living                      Prior Function            PT Goals (current goals can now be found in the care plan section) Progress towards PT goals: Progressing toward goals    Frequency  7X/week    PT Plan Current plan remains appropriate    Co-evaluation             End of Session Equipment Utilized During Treatment: Gait belt Activity Tolerance: Patient tolerated treatment well Patient left: in chair;with call bell/phone within reach;with nursing/sitter in room     Time: [id number] PT Time Calculation (min): 26 min  Charges:  $Therapeutic Activity: 23-37 mins                    G Codes:      Kennith Gain 2013/08/22, 11:11 AM

## 2013-08-21 NOTE — Progress Notes (Signed)
OT Cancellation Note  Patient Details Name: [name] MRN: [id number] DOB: 30-Oct-1955   Cancelled Treatment:    Reason Eval/Treat Not Completed: Patient declined, no reason specified. Pt asleep when OT arrived. OT woke pt to complete session and pt declined due to fatigue. OT will follow-up with pt for safety prior to d/c.   Juluis Rainier [id number] 08/21/2013, 1:25 PM

## 2013-08-22 DIAGNOSIS — E119 Type 2 diabetes mellitus without complications: Secondary | ICD-10-CM | POA: Diagnosis present

## 2013-08-22 LAB — GLUCOSE, CAPILLARY
GLUCOSE-CAPILLARY: 211 mg/dL — AB (ref 70–99)
Glucose-Capillary: 213 mg/dL — ABNORMAL HIGH (ref 70–99)

## 2013-08-22 LAB — CBC
HCT: 24.2 % — ABNORMAL LOW (ref 36.0–46.0)
Hemoglobin: 7.9 g/dL — ABNORMAL LOW (ref 12.0–15.0)
MCH: 28 pg (ref 26.0–34.0)
MCHC: 32.6 g/dL (ref 30.0–36.0)
MCV: 85.8 fL (ref 78.0–100.0)
PLATELETS: 189 10*3/uL (ref 150–400)
RBC: 2.82 MIL/uL — ABNORMAL LOW (ref 3.87–5.11)
RDW: 14.8 % (ref 11.5–15.5)
WBC: 10.6 10*3/uL — ABNORMAL HIGH (ref 4.0–10.5)

## 2013-08-22 NOTE — Care Management Note (Signed)
CARE MANAGEMENT NOTE 08/22/2013  Patient:  [name], [name]   Account Number:  [id number]  Date Initiated:  08/21/2013  Documentation initiated by:  Ricki Miller  Subjective/Objective Assessment:   58 yr old female s/p left total hip arthroplasty.     Action/Plan:   Case manager spoke with patient concerning Home Health needs at discharge. patient preoperatively setup with [facility]. Patient wants SNF, social worker is aware. CM will follow  Patient going to [facility].   Anticipated DC Date:  08/22/2013   Anticipated DC Plan:  SKILLED NURSING FACILITY  In-house referral  Clinical Social Worker      DC Planning Services  CM consult      Choice offered to / List presented to:             Status of service:  Completed, signed off Medicare Important Message given?   (If response is "NO", the following Medicare IM given date fields will be blank) Date Medicare IM given:   Date Additional Medicare IM given:    Discharge Disposition:  [place]  Per UR Regulation:    If discussed at Long Length of Stay Meetings, dates discussed

## 2013-08-22 NOTE — Progress Notes (Signed)
Attempted to see pt. Pt going to SNF at 1:00 per nursing by way of ambulance and is currently eating lunch in prep to leave.  Will check back of d/c does not happen. Jinger Neighbors, [place] [phone]

## 2013-08-22 NOTE — Progress Notes (Signed)
CSW Armed forces technical officer) spoke with pt and provided with bed offers. Pt has chosen to [employer] [facility] to [facility] and requesting non-emergent ambulance transport. CSW prepared pt dc packet and placed with shadow chart. CSW arranged non-emergent ambulance transport at 1pm. Pt, pt family, pt nurse, and facility informed. CSW signing off.   [name], [place]

## 2013-08-22 NOTE — Progress Notes (Signed)
Agree with SPTA.    Jeydan Barner, PT [phone]  

## 2013-08-22 NOTE — Discharge Summary (Signed)
Patient ID: [name] MRN: [id number] DOB/AGE: 01-Nov-1955 58 y.o.  Admit date: 08/19/2013 Discharge date: 08/22/2013  Admission Diagnoses:  Principal Problem:   Arthritis of left hip Active Problems:   Diabetes   Discharge Diagnoses:  Same  Past Medical History  Diagnosis Date  . Hypertension   . Diabetes mellitus   . Arthritis   . PVD (peripheral vascular disease)   . CHF (congestive heart failure)     2014...@ [facility]    Surgeries: Procedure(s): [place] on 08/19/2013   Consultants:    Discharged Condition: Improved  Hospital Course: HYDEIA OSHITA is an 58 y.o. female who was admitted 08/19/2013 for operative treatment ofArthritis of left hip. Patient has severe unremitting pain that affects sleep, daily activities, and work/hobbies. After pre-op clearance the patient was taken to the operating room on 08/19/2013 and underwent  Procedure(s): TOTAL HIP ARTHROPLASTY.    Patient was given perioperative antibiotics: Anti-infectives   Start     Dose/Rate Route Frequency Ordered Stop   08/19/13 0821  ceFAZolin (ANCEF) 2-3 GM-% IVPB SOLR    Comments:  Scronce, Trina   : cabinet override      08/19/13 0821 08/19/13 0946   08/18/13 1138  ceFAZolin (ANCEF) IVPB 2 g/50 mL premix  Status:  Discontinued     2 g 100 mL/hr over 30 Minutes Intravenous On call to O.R. 08/18/13 1138 08/19/13 1519       Patient was given sequential compression devices, early ambulation, and chemoprophylaxis to prevent DVT.  Patient benefited maximally from hospital stay and there were no complications.    Recent vital signs: Patient Vitals for the past 24 hrs:  BP Temp Temp src Pulse Resp SpO2  08/22/13 0516 134/55 mmHg 97.7 F (36.5 C) - 81 18 96 %  08/21/13 2027 130/55 mmHg 99.4 F (37.4 C) - 86 18 98 %  08/21/13 1600 - - - - 18 100 %  08/21/13 1330 138/55 mmHg 98 F (36.7 C) - 87 16 -  08/21/13 1159 - - - - 16 -  08/21/13 0759 - - - - 18 -  08/21/13 0757 117/64 mmHg  99.3 F (37.4 C) Oral 84 16 96 %     Recent laboratory studies:  Recent Labs  08/20/13 0555 08/21/13 0445 08/22/13 0500  WBC 8.1 10.4 10.6*  HGB 9.0* 8.2* 7.9*  HCT 27.7* 25.4* 24.2*  PLT 223 190 189  NA 133*  --   --   K 5.3  --   --   CL 99  --   --   CO2 21  --   --   BUN 26*  --   --   CREATININE 1.35*  --   --   GLUCOSE 340*  --   --   CALCIUM 8.6  --   --      Discharge Medications:     Medication List    STOP taking these medications       aspirin 81 MG tablet  Replaced by:  aspirin EC 325 MG tablet     HYDROcodone-acetaminophen 5-325 MG per tablet  Commonly known as:  NORCO/VICODIN      TAKE these medications       aspirin EC 325 MG tablet  Take 1 tablet (325 mg total) by mouth 2 (two) times daily.     carvedilol 25 MG tablet  Commonly known as:  COREG  Take 25 mg by mouth 2 (two) times daily with a meal.  glipiZIDE 5 MG tablet  Commonly known as:  GLUCOTROL  Take 5 mg by mouth 2 (two) times daily.     hydroxypropyl methylcellulose 2.5 % ophthalmic solution  Commonly known as:  ISOPTO TEARS  Place 1 drop into both eyes daily as needed for dry eyes.     methocarbamol 500 MG tablet  Commonly known as:  ROBAXIN  Take 1 tablet (500 mg total) by mouth 2 (two) times daily with a meal.     oxyCODONE-acetaminophen 5-325 MG per tablet  Commonly known as:  ROXICET  Take 1 tablet by mouth every 4 (four) hours as needed.        Diagnostic Studies: Dg Chest 2 View  08/13/2013   CLINICAL DATA:  Preop for hip replacement surgery.  Hypertension.  EXAM: CHEST  2 VIEW  COMPARISON:  07/06/2013  FINDINGS: Cardiac silhouette is normal in size. Mediastinum is normal in contour and caliber. There is discoid atelectasis in the left upper lobe lingula. Lungs are otherwise clear. No pleural effusion. No pneumothorax.  The bony thorax is intact.  IMPRESSION: No active cardiopulmonary disease.   Electronically Signed   By: Lajean Manes M.D.   On: 08/13/2013 16:26    Dg Pelvis Portable  08/19/2013   CLINICAL DATA:  Postop left hip.  EXAM: PORTABLE PELVIS 1-2 VIEWS  COMPARISON:  None.  FINDINGS: Patient status post total left hip replacement with good anatomic alignment. DJD and/or avascular right hip noted. Peripheral vascular calcification present.  IMPRESSION: Status post left hip replacement with good anatomic alignment on both AP views.   Electronically Signed   By: Marcello Moores  Register   On: 08/19/2013 14:26    Disposition: 01-Home or Self Care      Discharge Orders   Future Appointments Provider Department Dept Phone   08/27/2013 5:30 PM Ndm-Nmch Dm Core Class 3 [facility] [phone]   01/31/2014 10:00 AM Mc-Cv Us4 [place] [facility] [address] [id number]   01/31/2014 10:40 AM Sharmon Leyden Nickel, NP [facility] [phone]   Future Orders Complete By Expires   Call MD / Call 911  As directed    Change dressing  As directed    Constipation Prevention  As directed    Diet - low sodium heart healthy  As directed    Driving restrictions  As directed    Follow the hip precautions as taught in Physical Therapy  As directed    Increase activity slowly as tolerated  As directed    Patient may shower  As directed       Follow-up Information   Follow up with Kerin Salen, MD.   Specialty:  Orthopedic Surgery   Contact information:   [place] [address] [phone]       Follow up with Kerin Salen, MD In 1 week.   Specialty:  Orthopedic Surgery   Contact information:   [place] [id number] [phone]        Signed: Kerin Salen 08/22/2013, 7:35 AM

## 2013-08-22 NOTE — Progress Notes (Signed)
Physical Therapy Treatment Patient Details Name: [name] MRN: [id number] DOB: 03/01/1956 Today's Date: 08/22/2013    History of Present Illness Pt is s/r Left posterior THR.    PT Comments    Patient was on EOB prior to therapy. Daughter present in room. Patient c/o dizziness and appeared lethargic. Spoke to nurse about status and continue recommend d/c to  ST SNF this afternoon for supervision with safety so that patient can progress with goals. Patient required increased time to complete tasks due to lethargy and fatigue. Patient would benefit from increased skilled interventions to maximize independence.    Follow Up Recommendations  SNF     Equipment Recommendations  Rolling walker with 5" wheels    Recommendations for Other Services       Precautions / Restrictions Precautions Precautions: Posterior Hip Restrictions LLE Weight Bearing: Weight bearing as tolerated    Mobility  Bed Mobility               General bed mobility comments: Patient was EOB prior to therapy.  Transfers Overall transfer level: Needs assistance Equipment used: Rolling walker (2 wheeled) Transfers: Sit to/from [facility] Sit to Stand: Mod assist Stand pivot transfers: Min assist       General transfer comment: Patient required cues for hand placement to upright posture. Sit to stand x 2 after first attempt unsuccessful.   Ambulation/Gait                 Stairs            Wheelchair Mobility    Modified Rankin (Stroke Patients Only)       Balance                                    Cognition Arousal/Alertness: Lethargic Behavior During Therapy: Flat affect Overall Cognitive Status: Impaired/Different from baseline Area of Impairment: Safety/judgement;Awareness               General Comments: Patient lethargic throughout session, unable to complete sentences. Daughter present states different from baseline.     Exercises Total Joint Exercises Ankle Circles/Pumps: AROM;Strengthening;10 reps;Seated;Both Long Arc Quad: AROM;Seated;Both;10 reps    General Comments        Pertinent Vitals/Pain Patient did not appear in acute distress.     Home Living                      Prior Function            PT Goals (current goals can now be found in the care plan section) Progress towards PT goals: Not progressing toward goals - comment    Frequency       PT Plan Discharge plan needs to be updated    Co-evaluation             End of Session Equipment Utilized During Treatment: Gait belt Activity Tolerance: Patient limited by lethargy Patient left: in chair;with call bell/phone within reach;with family/visitor present     Time: [id number] PT Time Calculation (min): 23 min  Charges:                       G Codes:      [place], SPTA 08/22/2013, 12:18 PM

## 2013-08-22 NOTE — Progress Notes (Signed)
Patient ID: [name], female   DOB: January 14, 1956, 58 y.o.   MRN: [id number] PATIENT ID: [name]  MRN: [id number]  DOB/AGE:  [birth year] / 58 y.o.  3 Days Post-Op Procedure(s) (LRB): TOTAL HIP ARTHROPLASTY (Left)    PROGRESS NOTE Subjective: Patient is alert, oriented,no Nausea, no Vomiting, yes passing gas, no Bowel Movement. Taking PO well. Denies SOB, Chest or Calf Pain. Using Incentive Spirometer, PAS in place. Ambulate Bed to chair transfers, weightbearing as tolerated.  So far Patient reports pain as 3 on 0-10 scale  .    Objective: Vital signs in last 24 hours: Filed Vitals:   08/21/13 1330 08/21/13 1600 08/21/13 2027 08/22/13 0516  BP: 138/55  130/55 134/55  Pulse: 87  86 81  Temp: 98 F (36.7 C)  99.4 F (37.4 C) 97.7 F (36.5 C)  TempSrc:      Resp: 16 18 18 18   Height:      Weight:      SpO2:  100% 98% 96%      Intake/Output from previous day: I/O last 3 completed shifts: In: 2941.5 [P.O.:600; I.V.:2341.5] Out: 2900 [Urine:2900]   Intake/Output this shift:     LABORATORY DATA:  Recent Labs  08/20/13 0555  08/21/13 0445  08/21/13 1625 08/21/13 2136 08/22/13 0500 08/22/13 0624  WBC 8.1  --  10.4  --   --   --  10.6*  --   HGB 9.0*  --  8.2*  --   --   --  7.9*  --   HCT 27.7*  --  25.4*  --   --   --  24.2*  --   PLT 223  --  190  --   --   --  189  --   NA 133*  --   --   --   --   --   --   --   K 5.3  --   --   --   --   --   --   --   CL 99  --   --   --   --   --   --   --   CO2 21  --   --   --   --   --   --   --   BUN 26*  --   --   --   --   --   --   --   CREATININE 1.35*  --   --   --   --   --   --   --   GLUCOSE 340*  --   --   --   --   --   --   --   GLUCAP  --   < >  --   < > 293* 166*  --  211*  CALCIUM 8.6  --   --   --   --   --   --   --   < > = values in this interval not displayed.  Examination: Neurologically intact ABD soft Neurovascular intact Sensation intact distally Intact pulses  distally Dorsiflexion/Plantar flexion intact Incision: no drainage No cellulitis present Compartment soft} XR AP&Lat of hip shows well placed\fixed THA, No pretibial edema  Assessment:   3 Days Post-Op Procedure(s) (LRB): TOTAL HIP ARTHROPLASTY (Left) ADDITIONAL DIAGNOSIS:  Acute Blood Loss Anemia, Diabetes, Hypertension and congestive heart failure, chronic kidney disease, peripheral vascular disease  Plan: PT/OT WBAT,  THA  posterior precautions  DVT Prophylaxis: SCDx72 hrs, ASA 325 mg BID x 2 weeks  DISCHARGE PLAN: Skilled Nursing Facility/Rehab,Should be able to transfer to skilled nursing facility today  DISCHARGE NEEDS: HHPT, HHRN, CPM, Walker and 3-in-1 comode seat

## 2013-08-27 ENCOUNTER — Other Ambulatory Visit: Payer: Self-pay | Admitting: *Deleted

## 2013-08-27 ENCOUNTER — Encounter: Payer: Self-pay | Admitting: Vascular Surgery

## 2013-08-27 ENCOUNTER — Ambulatory Visit: Payer: No Typology Code available for payment source

## 2013-08-27 ENCOUNTER — Telehealth: Payer: Self-pay | Admitting: Vascular Surgery

## 2013-08-27 DIAGNOSIS — I6529 Occlusion and stenosis of unspecified carotid artery: Secondary | ICD-10-CM

## 2013-08-27 NOTE — Telephone Encounter (Signed)
Message copied by Gena Fray on Tue Aug 27, 2013 10:39 AM ------      Message from: Elam Dutch      Created: Tue Aug 27, 2013  7:34 AM      Regarding: RE: Results Report       She can get a follow up carotid duplex bilaterally at her next visit when we look at her legs            Thanks            Juanda Crumble      ----- Message -----         From: Gena Fray         Sent: 08/26/2013   3:49 PM           To: Elam Dutch, MD      Subject: Results Report                                           Dr Oneida Alar,            Ms Gohde saw you on 08/01/13, at that visit you ordered a carotid duplex, and indicated to put the results on your desk for review. Ms Suleman had the duplex on 08/08/13. Do we need to send her a results letter and schedule a protocol appointment? I did not see any indication that she has been contacted with results in EPIC.            Thanks,      Hinton Dyer       ------

## 2013-10-11 ENCOUNTER — Ambulatory Visit: Payer: No Typology Code available for payment source | Attending: Orthopedic Surgery

## 2013-10-28 ENCOUNTER — Ambulatory Visit: Payer: No Typology Code available for payment source | Admitting: Physical Therapy

## 2013-11-06 ENCOUNTER — Ambulatory Visit: Payer: No Typology Code available for payment source | Attending: Orthopedic Surgery

## 2013-11-19 ENCOUNTER — Other Ambulatory Visit: Payer: Self-pay | Admitting: Orthopedic Surgery

## 2013-12-02 ENCOUNTER — Encounter (HOSPITAL_COMMUNITY): Payer: Self-pay | Admitting: Pharmacy Technician

## 2013-12-06 ENCOUNTER — Other Ambulatory Visit (HOSPITAL_COMMUNITY): Payer: Self-pay | Admitting: *Deleted

## 2013-12-06 ENCOUNTER — Inpatient Hospital Stay (HOSPITAL_COMMUNITY)
Admission: RE | Admit: 2013-12-06 | Discharge: 2013-12-06 | Disposition: A | Discharge status: Home / Self Care | Payer: No Typology Code available for payment source | Source: Ambulatory Visit

## 2013-12-06 NOTE — Progress Notes (Signed)
Pt did not show up for PAT appt, called her and she stated that she forgot that she had this appt and asked to be rescheduled for next week. I told her that I would have one of the schedulers to call her to reschedule.

## 2013-12-06 NOTE — Pre-Procedure Instructions (Signed)
[name]  12/06/2013   Your procedure is scheduled on:  Monday, December 16, 2013 at 10:05 AM.   Report to [facility] Entrance "A" Admitting Office at 8:00 AM.   Call this number if you have problems the morning of surgery: [phone]   Remember:   Do not eat food or drink liquids after midnight Sunday, 12/15/13.   Take these medicines the morning of surgery with A SIP OF WATER: carvedilol (COREG),  HYDROcodone-acetaminophen (NORCO/VICODIN) - if needed.  Stop Aspirin as of Monday, 12/09/13.  Do not take your diabetic medicine day of surgery.    Do not wear jewelry, make-up or nail polish.  Do not wear lotions, powders, or perfumes. You may wear deodorant.  Do not shave 48 hours prior to surgery.   Do not bring valuables to the hospital.  [facility] is not responsible                  for any belongings or valuables.               Contacts, dentures or bridgework may not be worn into surgery.  Leave suitcase in the car. After surgery it may be brought to your room.  For patients admitted to the hospital, discharge time is determined by your                treatment team.         Special Instructions: [name] - Preparing for Surgery  Before surgery, you can play an important role.  Because skin is not sterile, your skin needs to be as free of germs as possible.  You can reduce the number of germs on you skin by washing with CHG (chlorahexidine gluconate) soap before surgery.  CHG is an antiseptic cleaner which kills germs and bonds with the skin to continue killing germs even after washing.  Please DO NOT use if you have an allergy to CHG or antibacterial soaps.  If your skin becomes reddened/irritated stop using the CHG and inform your nurse when you arrive at Short Stay.  Do not shave (including legs and underarms) for at least 48 hours prior to the first CHG shower.  You may shave your face.  Please follow these instructions carefully:   1.  Shower with CHG  Soap the night before surgery and the                                morning of Surgery.  2.  If you choose to wash your hair, wash your hair first as usual with your       normal shampoo.  3.  After you shampoo, rinse your hair and body thoroughly to remove the                      Shampoo.  4.  Use CHG as you would any other liquid soap.  You can apply chg directly       to the skin and wash gently with scrungie or a clean washcloth.  5.  Apply the CHG Soap to your body ONLY FROM THE NECK DOWN.        Do not use on open wounds or open sores.  Avoid contact with your eyes, ears, mouth and genitals (private parts).  Wash genitals (private parts) with your normal soap.  6.  Wash thoroughly, paying special attention to the  area where your surgery        will be performed.  7.  Thoroughly rinse your body with warm water from the neck down.  8.  DO NOT shower/wash with your normal soap after using and rinsing off       the CHG Soap.  9.  Pat yourself dry with a clean towel.            10.  Wear clean pajamas.            11.  Place clean sheets on your bed the night of your first shower and do not        sleep with pets.  Day of Surgery  Do not apply any lotions the morning of surgery.  Please wear clean clothes to the hospital/surgery center.     Please read over the following fact sheets that you were given: Pain Booklet, Coughing and Deep Breathing, Blood Transfusion Information, MRSA Information and Surgical Site Infection Prevention

## 2013-12-09 ENCOUNTER — Other Ambulatory Visit: Payer: Self-pay | Admitting: Obstetrics & Gynecology

## 2013-12-09 DIAGNOSIS — Z1231 Encounter for screening mammogram for malignant neoplasm of breast: Secondary | ICD-10-CM

## 2013-12-16 ENCOUNTER — Inpatient Hospital Stay (HOSPITAL_COMMUNITY)
Admission: RE | Admit: 2013-12-16 | Payer: No Typology Code available for payment source | Source: Ambulatory Visit | Admitting: Orthopedic Surgery

## 2013-12-16 ENCOUNTER — Encounter (HOSPITAL_COMMUNITY): Admission: RE | Payer: Self-pay | Source: Ambulatory Visit

## 2013-12-16 SURGERY — ARTHROPLASTY, HIP, TOTAL,POSTERIOR APPROACH
Anesthesia: Choice | Laterality: Right

## 2013-12-24 ENCOUNTER — Ambulatory Visit (HOSPITAL_COMMUNITY)
Admission: RE | Admit: 2013-12-24 | Discharge: 2013-12-24 | Disposition: A | Discharge status: Home / Self Care | Payer: No Typology Code available for payment source | Source: Ambulatory Visit | Attending: Obstetrics & Gynecology | Admitting: Obstetrics & Gynecology

## 2013-12-24 DIAGNOSIS — Z1231 Encounter for screening mammogram for malignant neoplasm of breast: Secondary | ICD-10-CM | POA: Insufficient documentation

## 2014-01-30 ENCOUNTER — Encounter: Payer: Self-pay | Admitting: Family

## 2014-01-31 ENCOUNTER — Ambulatory Visit: Payer: No Typology Code available for payment source | Admitting: Family

## 2014-01-31 ENCOUNTER — Other Ambulatory Visit (HOSPITAL_COMMUNITY): Payer: No Typology Code available for payment source

## 2014-01-31 ENCOUNTER — Encounter (HOSPITAL_COMMUNITY): Payer: No Typology Code available for payment source

## 2014-04-10 ENCOUNTER — Encounter (HOSPITAL_COMMUNITY): Payer: Self-pay | Admitting: Vascular Surgery

## 2015-01-16 ENCOUNTER — Other Ambulatory Visit: Payer: Self-pay | Admitting: Obstetrics & Gynecology

## 2015-01-16 DIAGNOSIS — Z1231 Encounter for screening mammogram for malignant neoplasm of breast: Secondary | ICD-10-CM

## 2015-01-22 ENCOUNTER — Ambulatory Visit (HOSPITAL_COMMUNITY): Payer: PRIVATE HEALTH INSURANCE | Attending: Obstetrics & Gynecology

## 2015-02-04 ENCOUNTER — Inpatient Hospital Stay: Admission: RE | Admit: 2015-02-04 | Payer: No Typology Code available for payment source | Source: Ambulatory Visit

## 2015-02-24 IMAGING — CR DG PORTABLE PELVIS
2 series · 2 of 2 positions shown · non-contrast
Comparison: None.

CLINICAL DATA: Postop left hip.

EXAM:
PORTABLE PELVIS 1-2 VIEWS

[AP (1 of 2)]
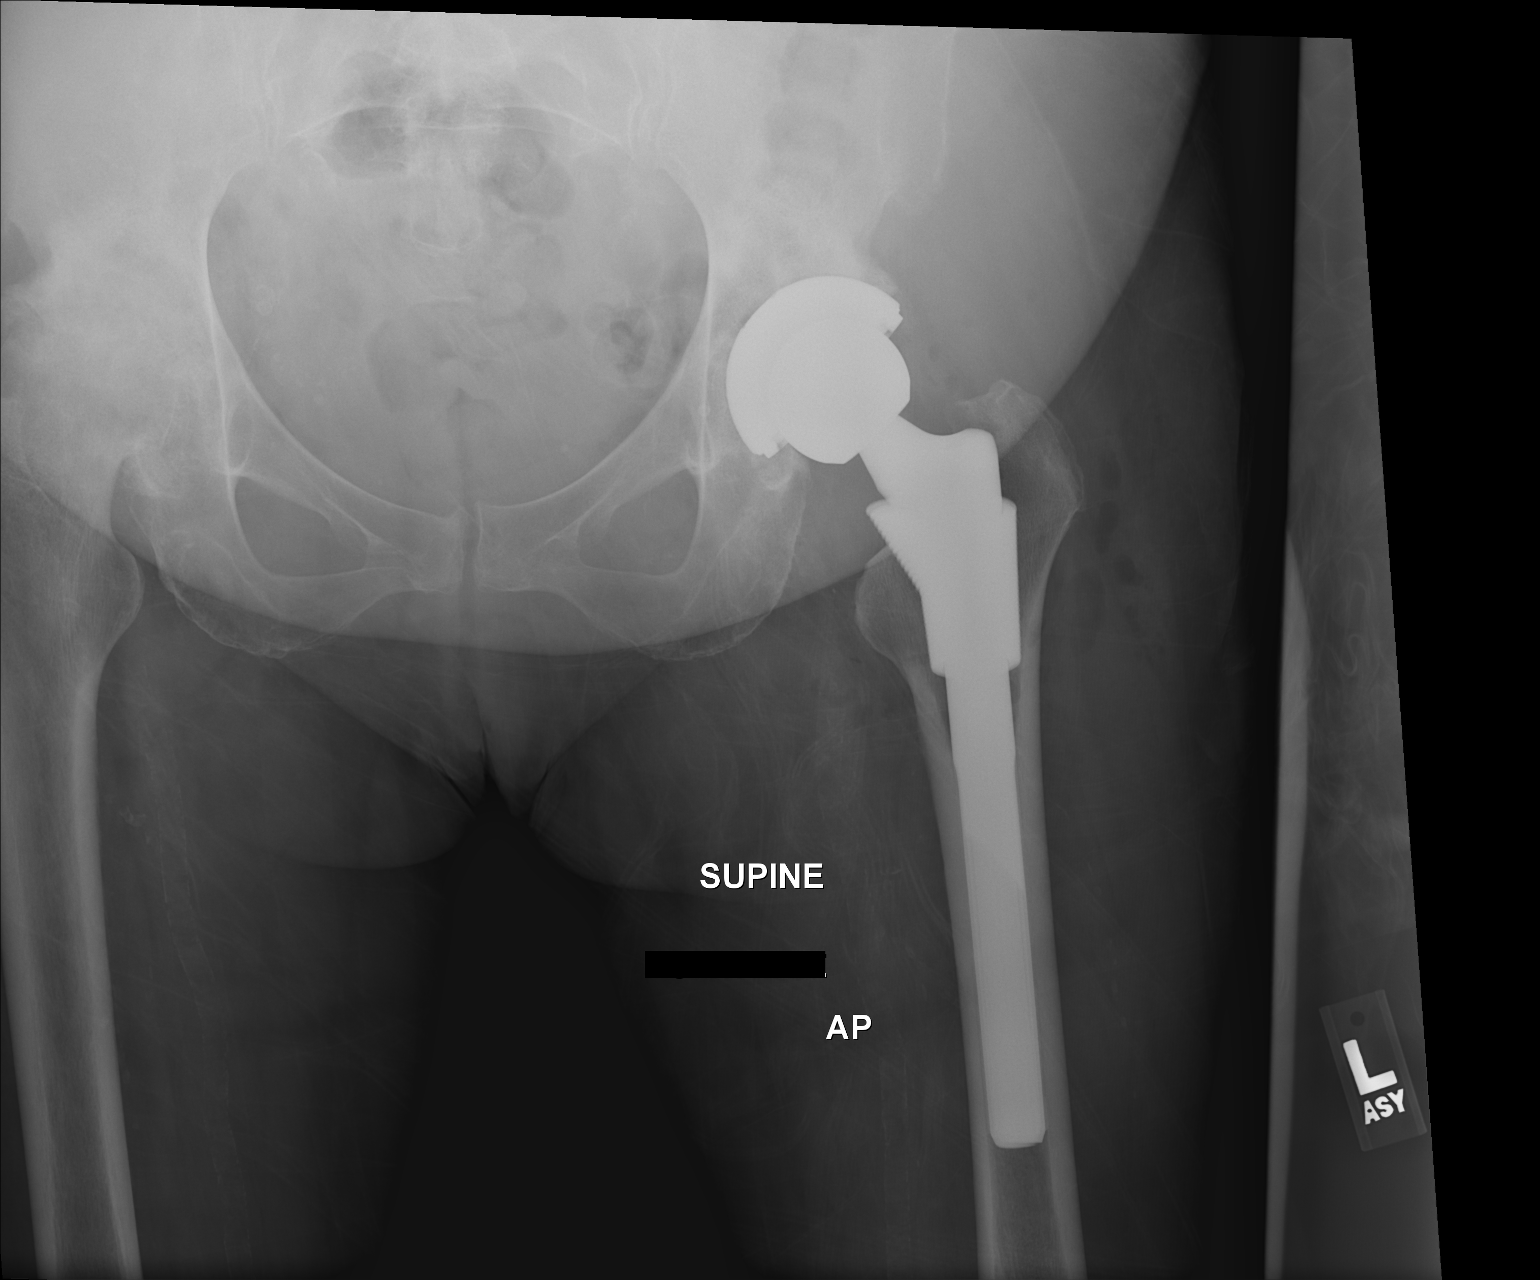

[AP (2 of 2)]
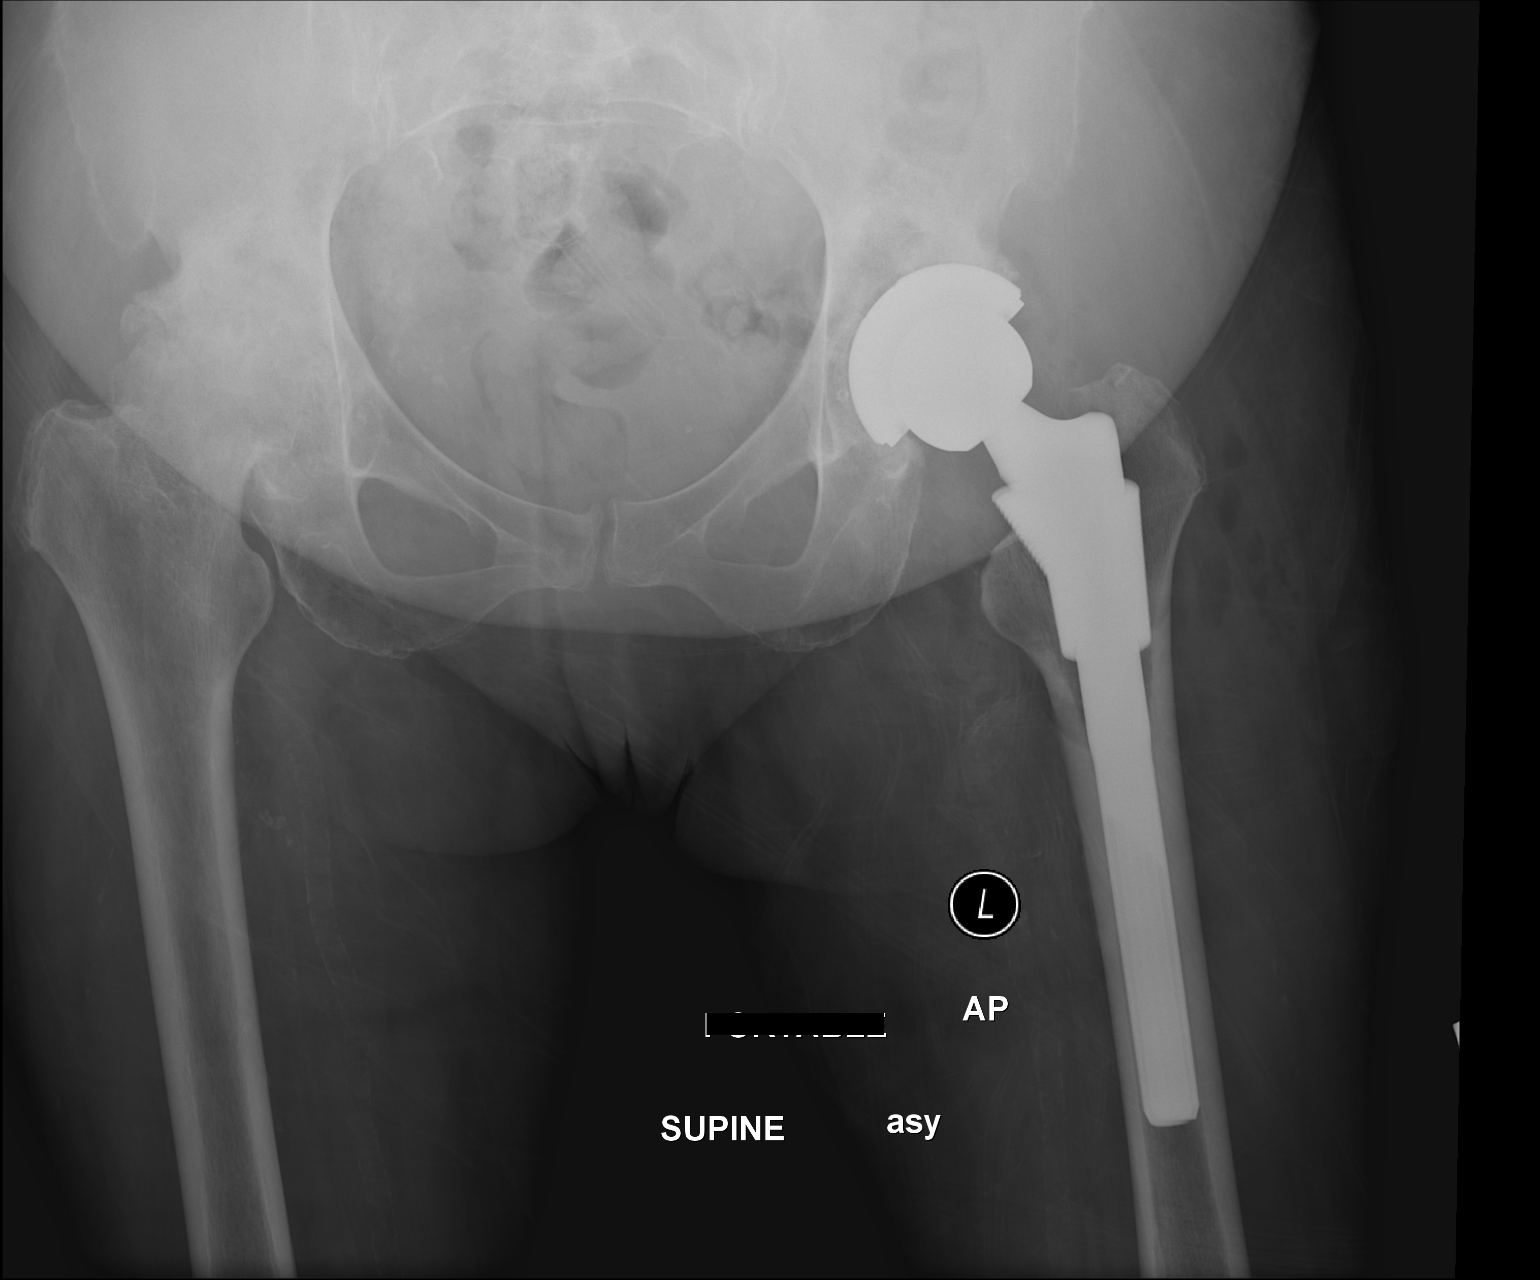

[2 of 2 positions shown; findings below may reference images not displayed]

FINDINGS: Patient status post total left hip replacement with good anatomic
alignment. DJD and/or avascular right hip noted. Peripheral vascular
calcification present.
IMPRESSION: Status post left hip replacement with good anatomic alignment on
both AP views.

## 2015-06-08 ENCOUNTER — Encounter: Payer: Self-pay | Admitting: Internal Medicine

## 2015-06-08 ENCOUNTER — Ambulatory Visit (INDEPENDENT_AMBULATORY_CARE_PROVIDER_SITE_OTHER): Payer: BLUE CROSS/BLUE SHIELD | Admitting: Internal Medicine

## 2015-06-08 VITALS — BP 151/72 | HR 72 | Temp 97.5°F | Ht 63.0 in | Wt 177.1 lb

## 2015-06-08 DIAGNOSIS — I130 Hypertensive heart and chronic kidney disease with heart failure and stage 1 through stage 4 chronic kidney disease, or unspecified chronic kidney disease: Secondary | ICD-10-CM

## 2015-06-08 DIAGNOSIS — IMO0002 Reserved for concepts with insufficient information to code with codable children: Secondary | ICD-10-CM

## 2015-06-08 DIAGNOSIS — I10 Essential (primary) hypertension: Secondary | ICD-10-CM

## 2015-06-08 DIAGNOSIS — E1122 Type 2 diabetes mellitus with diabetic chronic kidney disease: Secondary | ICD-10-CM | POA: Diagnosis not present

## 2015-06-08 DIAGNOSIS — N183 Chronic kidney disease, stage 3 (moderate): Secondary | ICD-10-CM

## 2015-06-08 DIAGNOSIS — E1151 Type 2 diabetes mellitus with diabetic peripheral angiopathy without gangrene: Secondary | ICD-10-CM | POA: Diagnosis not present

## 2015-06-08 DIAGNOSIS — E113413 Type 2 diabetes mellitus with severe nonproliferative diabetic retinopathy with macular edema, bilateral: Secondary | ICD-10-CM

## 2015-06-08 DIAGNOSIS — E1165 Type 2 diabetes mellitus with hyperglycemia: Secondary | ICD-10-CM

## 2015-06-08 DIAGNOSIS — I5042 Chronic combined systolic (congestive) and diastolic (congestive) heart failure: Secondary | ICD-10-CM

## 2015-06-08 DIAGNOSIS — Z7984 Long term (current) use of oral hypoglycemic drugs: Secondary | ICD-10-CM

## 2015-06-08 DIAGNOSIS — J0100 Acute maxillary sinusitis, unspecified: Secondary | ICD-10-CM

## 2015-06-08 DIAGNOSIS — R5381 Other malaise: Secondary | ICD-10-CM

## 2015-06-08 DIAGNOSIS — R05 Cough: Secondary | ICD-10-CM

## 2015-06-08 DIAGNOSIS — J3489 Other specified disorders of nose and nasal sinuses: Secondary | ICD-10-CM

## 2015-06-08 LAB — GLUCOSE, CAPILLARY
GLUCOSE-CAPILLARY: 598 mg/dL — AB (ref 65–99)
Glucose-Capillary: 501 mg/dL — ABNORMAL HIGH (ref 65–99)

## 2015-06-08 LAB — BASIC METABOLIC PANEL
Anion gap: 14 (ref 5–15)
BUN: 26 mg/dL — ABNORMAL HIGH (ref 6–20)
CHLORIDE: 98 mmol/L — AB (ref 101–111)
CO2: 24 mmol/L (ref 22–32)
CREATININE: 1.6 mg/dL — AB (ref 0.44–1.00)
Calcium: 9.2 mg/dL (ref 8.9–10.3)
GFR calc non Af Amer: 34 mL/min — ABNORMAL LOW (ref >60)
GFR, EST AFRICAN AMERICAN: 40 mL/min — AB (ref >60)
GLUCOSE: 695 mg/dL — AB (ref 65–99)
Potassium: 4.2 mmol/L (ref 3.5–5.1)
Sodium: 136 mmol/L (ref 135–145)

## 2015-06-08 LAB — HM DIABETES EYE EXAM

## 2015-06-08 LAB — POCT GLYCOSYLATED HEMOGLOBIN (HGB A1C): Hemoglobin A1C: 12.8

## 2015-06-08 MED ORDER — AMLODIPINE BESYLATE 5 MG PO TABS: 5.0000 mg | ORAL_TABLET | Freq: Every day | ORAL | Status: DC

## 2015-06-08 MED ORDER — SODIUM CHLORIDE 0.9 % IV BOLUS (SEPSIS)
500.0000 mL | Freq: Once | INTRAVENOUS | Status: AC
  Administered 2015-06-08: 500 mL via INTRAVENOUS

## 2015-06-08 MED ORDER — INSULIN ASPART 100 UNIT/ML ~~LOC~~ SOLN
5.0000 [IU] | Freq: Once | SUBCUTANEOUS | Status: AC
  Administered 2015-06-08: 5 [IU] via SUBCUTANEOUS

## 2015-06-08 MED ORDER — METFORMIN HCL 500 MG PO TABS: 500.0000 mg | ORAL_TABLET | Freq: Every day | ORAL | Status: DC

## 2015-06-08 NOTE — Progress Notes (Signed)
Subjective:   Patient ID: [name] female   DOB: July 12, 1955 60 y.o.   MRN: [id number]  HPI: [name] is a 60 y.o. female w/ PMHx of HTN, DM type II, PVD, and chronic combined CHF, presents to the clinic today for a new patient visit, mostly for management of her DM type II. Patient has not seen a PCP in some time and states that her cardiologist has previously been managing her other medical issues. Today, she complains of symptoms of sinus congestion, post-nasal drip, and mild dry cough. No recent fever, chills, or vomiting, has had some intermittent nausea. States that her husband was recently treated for pneumonia and now she is worried she has it as well. No SOB. SpO2 100% on RA.   Has only been taking glipizide for her DM and states that her CBG's have been in the 200-300's at home when she checks every other day or so. She denies every having symptoms of hypoglycemia (no tremulousness, diaphoresis, nausea, palpitations, etc). On exam today, patient had CBG in the 500's, did admit to some mild blurry vision, but otherwise felt okay.   Patient does not smoke, drink alcohol, or use recreational drugs. Lives with her husband who accompanied her to her visit.   Past Medical History  Diagnosis Date  . Hypertension   . Arthritis   . PVD (peripheral vascular disease) ([place])   . CHF (congestive heart failure) ([place])     2014...@ [facility]  . Diabetes mellitus     diagnosed 2001   Family History  Problem Relation Age of Onset  . Diabetes Mother     Current Outpatient Prescriptions  Medication Sig Dispense Refill  . amLODipine (NORVASC) 5 MG tablet Take 1 tablet (5 mg total) by mouth daily.    Marland Kitchen aspirin EC 81 MG tablet Take 81 mg by mouth daily.    . carvedilol (COREG) 25 MG tablet Take 25 mg by mouth 2 (two) times daily with a meal.    . glipiZIDE (GLUCOTROL) 10 MG tablet Take 10 mg by mouth 2 (two) times daily.    Marland Kitchen HYDROcodone-acetaminophen (NORCO/VICODIN) 5-325 MG per  tablet Take 1 tablet by mouth 4 (four) times daily as needed for moderate pain.    . metFORMIN (GLUCOPHAGE) 500 MG tablet Take 1 tablet (500 mg total) by mouth daily with breakfast. 30 tablet 2   No current facility-administered medications for this visit.    Review of Systems: General: Positive for blurry vision. Denies fever, chills, diaphoresis, appetite change and fatigue.  Respiratory: Positive for dry cough, sinus congestion. Denies SOB, DOE, and wheezing.   Cardiovascular: Denies chest pain and palpitations.  Gastrointestinal: Positive for nausea. Denies vomiting, abdominal pain, and diarrhea.  Genitourinary: Denies dysuria, increased frequency, and flank pain. Endocrine: Positive for polyuria. Denies hot or cold intolerance, and polydipsia. Musculoskeletal: Denies myalgias, back pain, joint swelling, arthralgias and gait problem.  Skin: Denies pallor, rash and wounds.  Neurological: Denies dizziness, seizures, syncope, weakness, lightheadedness, numbness and headaches.  Psychiatric/Behavioral: Denies mood changes, and sleep disturbances.  Objective:   Physical Exam: Filed Vitals:   06/08/15 1034  BP: 151/72  Pulse: 72  Temp: 97.5 F (36.4 C)  TempSrc: Oral  Height: 5\' 3"  (1.6 m)  Weight: 177 lb 1.6 oz (80.332 kg)  SpO2: 100%    General: Overweight AA female, alert, cooperative, NAD. HEENT: PERRL, EOMI. Moist mucus membranes Neck: Full range of motion without pain, supple, no lymphadenopathy or carotid bruits Lungs:  Clear to ascultation bilaterally, normal work of respiration, no wheezes, rales, rhonchi Heart: RRR, no murmurs, gallops, or rubs Abdomen: Soft, non-tender, non-distended, BS + Extremities: No cyanosis, clubbing, or edema Neurologic: Alert & oriented x3, cranial nerves II-XII intact, strength grossly intact, sensation intact to light touch   Assessment & Plan:   Please see problem based assessment and plan.

## 2015-06-08 NOTE — Patient Instructions (Addendum)
1. Please make a follow up appointment for 1 week.   2. Please take all medications as previously prescribed with the following changes:  Start taking Metformin 500 mg daily.   Check your blood sugars 3 times daily and don't forget to bring your meter next time you come to clinic.   You will also meet with Debera Lat, the diabetes educator, during your next appointment  We will likely need to add Lantus insulin as well to better control your blood sugar.   Continue taking your Coreg + Amlodipine for blood pressure.   BRING YOUR MEDICATIONS WITH YOU NEXT TIME YOU COME TO CLINIC.   3. If you have worsening of your symptoms or new symptoms arise, please call the clinic [phone]), or go to the ER immediately if symptoms are severe.

## 2015-06-09 DIAGNOSIS — J019 Acute sinusitis, unspecified: Secondary | ICD-10-CM | POA: Insufficient documentation

## 2015-06-09 DIAGNOSIS — M1611 Unilateral primary osteoarthritis, right hip: Secondary | ICD-10-CM | POA: Insufficient documentation

## 2015-06-09 NOTE — Assessment & Plan Note (Signed)
Patient with sinus pressure, congestion, post nasal drip, dry cough, and general malaise. Suspect that she has sinusitis. Patient was concerned that she has a pneumonia given that her husband recently had a pneumonia, however, patient with non-productive cough, no fever, SOB. Chest clear to auscultation. Sinusitis may also be contributing to severe elevation in blood sugars as well.  -OTC symptomatic relief for now -If patient not improved and worsened cough and pulmonary type symptoms, will consider further pneumonia workup.

## 2015-06-09 NOTE — Assessment & Plan Note (Addendum)
Patient with ECHO as recently as 06/2013 shows EF of 30-35% and grade 2 diastolic dysfunction. Has not seen cardiologist or any physician in some time. Continues to take Coreg, ASA, and Norvasc. Was previously taken off of her ARB (was previously taking Losartan 100 mg daily). Given her worsening renal function. Patient only has symptoms of mild DOE with exertion, however, her severe right hip arthritis interferes with her activity more than her CHF symptoms. Appears euvolemic on exam, lungs sound clear. Did not address her CHF much today given her significant elevation of her glucose, however, future plan as below: -Will address further at next visit. Will need a repeat ECHO (have not ordered this yet). After establishing a baseline Cr, may need to add back at least low dose ARB. Is also candidate for Spironolactone given her previous ECHO, but will determine after repeat ECHO. May also need low dose Lasix as well.  -Would advise follow up with cardiologist

## 2015-06-09 NOTE — Assessment & Plan Note (Addendum)
BP Readings from Last 3 Encounters:  06/08/15 151/72  08/22/13 134/55  08/13/13 162/90    Lab Results  Component Value Date   NA 136 06/08/2015   K 4.2 06/08/2015   CREATININE 1.60* 06/08/2015    Assessment: Blood pressure control:  BP mildly elevated  Comments: BP elevated today, did not bring her medications, is not aware of the dosages of her medications. It appears that she takes Coreg 25 mg bid + Norvasc 5 mg daily, but she does not really know. Will address further at her next visit in 1 week.   Plan: Medications:  Will likely need to increase Norvasc to 10 mg at next visit if she is not taking this already. Would also probably benefit from even a very low dose ARB, but will determine if this is reasonable based on her repeat BMP.  Educational resources provided: brochure Self management tools provided:   Other plans: RTC in 1 week, repeat BMP.

## 2015-06-09 NOTE — Assessment & Plan Note (Addendum)
Lab Results  Component Value Date   HGBA1C 12.8 06/08/2015   HGBA1C 9.3* 08/19/2013   HGBA1C 7.5* 07/07/2013     Assessment: Diabetes control:  POOR control Comments: Patient has not been to PCP in some time, only taking glipizide 10 mg bid with meals. Says she checks her CBG's intermittently but they usually run in the 200-300 range. No symptoms of hypoglycemia. On exam today, CBG of 598 initially, 695 on BMP. No acidosis, normal AG. Patient took her glipizide during her visit. Given 5 units of Novolog (insulin naive), and 1L NS bolus. CBG improved to 500 after 45 minutes of receiving Novolog. BMP shows Cr of 1.60, GFR of 40. Suspect she has mild AKI, but is borderline for Metformin use.   Plan: Medications:  continue current medications; Glipizide 10 mg bid. ADD Metformin 500 mg daily for now. IS GOING TO NEED BASAL INSULIN, will likely start at next clinic visit.  Home glucose monitoring: Frequency:  every other day Instruction/counseling given: reminded to bring blood glucose meter & log to each visit, reminded to bring medications to each visit, discussed foot care, discussed the need for weight loss and discussed diet Educational resources provided: brochure (denies ) Self management tools provided:   Other plans: Patient to return in 1 week. Advised her to check her blood sugars 3 times daily prior to her next visit. Will meet with diabetes educator, repeat BMP. If Cr improved, can increase Metformin to 500 mg bid. Add Lantus (or 70/30) at next visit, will likely start with 10 units. Will also have to slowly decrease Glipizide at a much later time. If Cr does not improve (ie: if 1.6 is her newest baseline), will leave Metformin at 500 mg daily, add higher dose of Lantus. Will need very close follow up for renal function given borderline GFR. Received eye exam today.

## 2015-06-09 NOTE — Assessment & Plan Note (Signed)
Unclear what baseline Cr is, but more likely in the 1.3 range. Cr 1.6 today. HCO3 normal, no other significant electrolyte abnormalities.  -Follow closely, repeat BMP in 1 week. If Cr back to previous baseline, can consider adding back low dose ARB for renal benefit, sCHF, and BP control.

## 2015-06-11 NOTE — Progress Notes (Signed)
Internal Medicine Clinic Attending  Case discussed with Dr. Jones at the time of the visit.  We reviewed the resident's history and exam and pertinent patient test results.  I agree with the assessment, diagnosis, and plan of care documented in the resident's note.  

## 2015-06-12 ENCOUNTER — Encounter: Payer: Self-pay | Admitting: Dietician

## 2015-06-12 NOTE — Addendum Note (Signed)
Addended byCorky Sox on: 06/12/2015 04:53 PM   Modules accepted: Orders

## 2015-06-15 ENCOUNTER — Ambulatory Visit (INDEPENDENT_AMBULATORY_CARE_PROVIDER_SITE_OTHER): Payer: BLUE CROSS/BLUE SHIELD | Admitting: Dietician

## 2015-06-15 ENCOUNTER — Encounter: Payer: Self-pay | Admitting: Internal Medicine

## 2015-06-15 ENCOUNTER — Ambulatory Visit (INDEPENDENT_AMBULATORY_CARE_PROVIDER_SITE_OTHER): Payer: BLUE CROSS/BLUE SHIELD | Admitting: Internal Medicine

## 2015-06-15 VITALS — BP 157/71 | HR 72 | Temp 97.9°F | Ht 63.0 in | Wt 177.8 lb

## 2015-06-15 DIAGNOSIS — E1165 Type 2 diabetes mellitus with hyperglycemia: Secondary | ICD-10-CM | POA: Diagnosis not present

## 2015-06-15 DIAGNOSIS — I10 Essential (primary) hypertension: Secondary | ICD-10-CM

## 2015-06-15 DIAGNOSIS — Z7984 Long term (current) use of oral hypoglycemic drugs: Secondary | ICD-10-CM

## 2015-06-15 DIAGNOSIS — E1151 Type 2 diabetes mellitus with diabetic peripheral angiopathy without gangrene: Secondary | ICD-10-CM

## 2015-06-15 DIAGNOSIS — IMO0002 Reserved for concepts with insufficient information to code with codable children: Secondary | ICD-10-CM

## 2015-06-15 DIAGNOSIS — Z713 Dietary counseling and surveillance: Secondary | ICD-10-CM

## 2015-06-15 DIAGNOSIS — E1122 Type 2 diabetes mellitus with diabetic chronic kidney disease: Secondary | ICD-10-CM | POA: Diagnosis not present

## 2015-06-15 DIAGNOSIS — N189 Chronic kidney disease, unspecified: Secondary | ICD-10-CM | POA: Diagnosis not present

## 2015-06-15 DIAGNOSIS — I5042 Chronic combined systolic (congestive) and diastolic (congestive) heart failure: Secondary | ICD-10-CM | POA: Diagnosis not present

## 2015-06-15 DIAGNOSIS — N183 Chronic kidney disease, stage 3 (moderate): Secondary | ICD-10-CM

## 2015-06-15 DIAGNOSIS — I130 Hypertensive heart and chronic kidney disease with heart failure and stage 1 through stage 4 chronic kidney disease, or unspecified chronic kidney disease: Secondary | ICD-10-CM

## 2015-06-15 DIAGNOSIS — Z0000 Encounter for general adult medical examination without abnormal findings: Secondary | ICD-10-CM

## 2015-06-15 DIAGNOSIS — E113299 Type 2 diabetes mellitus with mild nonproliferative diabetic retinopathy without macular edema, unspecified eye: Secondary | ICD-10-CM | POA: Insufficient documentation

## 2015-06-15 LAB — GLUCOSE, CAPILLARY: Glucose-Capillary: 213 mg/dL — ABNORMAL HIGH (ref 65–99)

## 2015-06-15 MED ORDER — GLUCOSE BLOOD VI STRP: ORAL_STRIP | Status: DC

## 2015-06-15 MED ORDER — SPIRONOLACTONE 25 MG PO TABS: 12.5000 mg | ORAL_TABLET | Freq: Every day | ORAL | Status: DC

## 2015-06-15 MED ORDER — METFORMIN HCL 500 MG PO TABS: 500.0000 mg | ORAL_TABLET | Freq: Two times a day (BID) | ORAL | Status: DC

## 2015-06-15 MED ORDER — AMLODIPINE BESYLATE 10 MG PO TABS: 10.0000 mg | ORAL_TABLET | Freq: Every day | ORAL | Status: DC

## 2015-06-15 NOTE — Patient Instructions (Signed)
Make a follow up in 4 weeks  Try to eat 3 times a day and limit the size of your serving of starch and sweets including fruit and dairy.   Eat more non starchy vegetables than starchy ones like potatoes, dried beans, sweet potatoes, rice, grits.   Call me with any questions.  Butch Penny [phone]

## 2015-06-15 NOTE — Progress Notes (Signed)
  Medical Nutrition Therapy:  Appt start time: 1500 end time:  1600. Visit # 1  Assessment:  Primary concerns today: meter and meal planning   Preferred Learning Style: No preference indicated  Learning Readiness: Ready vs. Contemplating  ANTHROPOMETRICS: weight-177#, height-63", BMI-31.5- obese class 1 WEIGHT HISTORY:need to assess at future visit SLEEP:need to assess at future visit MEDICATIONS:metformin and glipizide at maximum doses as of today. Patient able to state this BLOOD SUGAR: not A1C is 12.8 DIETARY INTAKE: Usual eating pattern includes 2-3 meals and 2-3 snacks per day. Everyday foods include grits, potatoes.  Avoided foods include tries to limit fried foods   24-hr recall not done today as patient had many questions she wanted answered Beverages: need to assess at future visit   Usual physical activity: need to assess at future visit   Progress Towards Goal(s):  In progress.   Nutritional Diagnosis:  NB-1.6 Limited adherence to nutrition-related recommendations As related to lack of support, self confidence and possilby competing priorities.  As evidenced by her verbalized knowledge and indication that she does not have the support she needs. .    Intervention:  Nutrition education about meter and meal planning Coordination of care: needs strips, meter and  and lancet Rxs sent in for ascencia contour next meter,   agree with starting insulin   Teaching Method Utilized: Visual,,Auditory, Hands on Handouts given during visit include:sample meter, what to eat  Barriers to learning/adherence to lifestyle change: support, she says it is hard to do what you know is right when others are eating things you shouldn't;t eat in front of you Demonstrated degree of understanding via:  Teach Back   Monitoring/Evaluation:  Dietary intake, exercise, meter, and body weight in 4 week(s).

## 2015-06-15 NOTE — Progress Notes (Signed)
   Subjective:   Patient ID: NYKEISHA SHUMATE female   DOB: August 20, 1955 60 y.o.   MRN: [id number]  HPI: Ms. SHAPARIS HERBOLD is a 60 y.o. female w/ PMHx of HTN, DM type II, PVD, and chronic combined CHF, presents to the clinic today for follow up regarding her DM type II. Patient was seen for her first visit in [facility] clinic last week at which time she had CBG in the 500's. Today, she is feeling better, has been taking Metformin 500 mg daily and Glipizide 10 mg bid. Has been checking her blood sugars at home 2-3 times daily and her range has typically been from the 180's to 300. This is a decent improvement from before. Still reluctant about starting insulin. Denies any symptoms of hypoglycemia. No blurry vision, polyuria, or polydipsia. Has also checked her BP at home, has been in the [id number] systolic.    Current Outpatient Prescriptions  Medication Sig Dispense Refill  . amLODipine (NORVASC) 5 MG tablet Take 1 tablet (5 mg total) by mouth daily.    Marland Kitchen aspirin EC 81 MG tablet Take 81 mg by mouth daily.    . carvedilol (COREG) 25 MG tablet Take 25 mg by mouth 2 (two) times daily with a meal.    . glipiZIDE (GLUCOTROL) 10 MG tablet Take 10 mg by mouth 2 (two) times daily.    Marland Kitchen HYDROcodone-acetaminophen (NORCO/VICODIN) 5-325 MG per tablet Take 1 tablet by mouth 4 (four) times daily as needed for moderate pain.    . metFORMIN (GLUCOPHAGE) 500 MG tablet Take 1 tablet (500 mg total) by mouth daily with breakfast. 30 tablet 2   No current facility-administered medications for this visit.   Review of Systems  General: Denies fever, diaphoresis, appetite change, and fatigue.  Respiratory: Denies SOB, cough, and wheezing.   Cardiovascular: Denies chest pain and palpitations.  Gastrointestinal: Denies nausea, vomiting, abdominal pain, and diarrhea Musculoskeletal: Denies myalgias, arthralgias, back pain, and gait problem.  Neurological: Denies dizziness, syncope, weakness, lightheadedness, and headaches.    Psychiatric/Behavioral: Denies mood changes, sleep disturbance, and agitation.   Objective:   Physical Exam: Filed Vitals:   06/15/15 1417  BP: 157/71  Pulse: 72  Temp: 97.9 F (36.6 C)  TempSrc: Oral  Height: 5\' 3"  (1.6 m)  Weight: 177 lb 12.8 oz (80.65 kg)  SpO2: 100%    General: Overweight AA female, alert, cooperative, NAD. HEENT: PERRL, EOMI. Moist mucus membranes Neck: Full range of motion without pain, supple, no lymphadenopathy or carotid bruits Lungs: Clear to ascultation bilaterally, normal work of respiration, no wheezes, rales, rhonchi Heart: RRR, no murmurs, gallops, or rubs Abdomen: Soft, non-tender, non-distended, BS + Extremities: No cyanosis, clubbing, or edema Neurologic: Alert & oriented x3, cranial nerves II-XII intact, strength grossly intact, sensation intact to light touch   Assessment & Plan:   Please see problem based assessment and plan.

## 2015-06-15 NOTE — Patient Instructions (Signed)
1. Please follow up in 1 week.   2. Please take all medications as previously prescribed with the following changes:  Increase Metformin to 500 mg TWICE DAILY  Start Spironolactone 12.5 mg daily.   PLEASE KEEP TRACK OF BLOOD SUGARS 3 TIMES DAILY.   3. If you have worsening of your symptoms or new symptoms arise, please call the clinic [phone]), or go to the ER immediately if symptoms are severe.

## 2015-06-16 ENCOUNTER — Other Ambulatory Visit: Payer: Self-pay

## 2015-06-16 DIAGNOSIS — Z0000 Encounter for general adult medical examination without abnormal findings: Secondary | ICD-10-CM | POA: Insufficient documentation

## 2015-06-16 HISTORY — DX: Encounter for general adult medical examination without abnormal findings: Z00.00

## 2015-06-16 LAB — BMP8+ANION GAP
ANION GAP: 20 mmol/L — AB (ref 10.0–18.0)
BUN/Creatinine Ratio: 18 (ref 9–23)
BUN: 20 mg/dL (ref 6–24)
CALCIUM: 9.6 mg/dL (ref 8.7–10.2)
CO2: 19 mmol/L (ref 18–29)
CREATININE: 1.1 mg/dL — AB (ref 0.57–1.00)
Chloride: 99 mmol/L (ref 96–106)
GFR calc Af Amer: 64 mL/min/{1.73_m2} (ref >59)
GFR, EST NON AFRICAN AMERICAN: 55 mL/min/{1.73_m2} — AB (ref >59)
Glucose: 185 mg/dL — ABNORMAL HIGH (ref 65–99)
Potassium: 4.3 mmol/L (ref 3.5–5.2)
SODIUM: 138 mmol/L (ref 134–144)

## 2015-06-16 MED ORDER — BAYER CONTOUR NEXT EZ W/DEVICE KIT: PACK | Status: DC

## 2015-06-16 MED ORDER — BAYER MICROLET LANCETS MISC: Status: DC

## 2015-06-16 MED ORDER — GLUCOSE BLOOD VI STRP: ORAL_STRIP | Status: DC

## 2015-06-16 NOTE — Assessment & Plan Note (Signed)
Plan for Urine microalb/Cr, foot exam, Hep C, HIV, and discuss flu shot at next clinic visit.

## 2015-06-16 NOTE — Telephone Encounter (Signed)
Mix up with supplies and meter sent yesterday, per Butch Penny these are correct testing supplies.  Can you send update to [facility]?

## 2015-06-16 NOTE — Assessment & Plan Note (Signed)
Referral given for Ophtho. Will see Dr. Katy Fitch later this month.

## 2015-06-16 NOTE — Assessment & Plan Note (Signed)
BP continues to be elevated.  -Given her EF of 30% and elevated blood pressure, will start Spironolactone 12/5 mg daily for BP control and mortality benefit given her systolic dysfunction. Cr improved today, 1.10, K 4.3.  -Return in 1 week for follow up, check BMP again at that time, can likely increase Spironolactone to 25 mg daily.

## 2015-06-16 NOTE — Assessment & Plan Note (Addendum)
Lab Results  Component Value Date   HGBA1C 12.8 06/08/2015   HGBA1C 9.3* 08/19/2013   HGBA1C 7.5* 07/07/2013     Assessment: Diabetes control:  CBG's improved since last visit.  Comments: Taking Glipizide 10 mg bid with meals. Started Metformin 500 mg daily. Chose low dose given her Cr of 1.6 on her visit last week. Looks as if her blood sugars have improved somewhat given her log book readings at home. Average is about 210. Needs new strips for her meter.   Plan: Medications:  Increase Metformin to 500 mg bid. Cr improved to 1.1, this is a normal GFR. Continue Glipizide as above.  Home glucose monitoring: Frequency:  tid Instruction/counseling given: reminded to get eye exam, reminded to bring blood glucose meter & log to each visit, reminded to bring medications to each visit, discussed foot care, discussed the need for weight loss and discussed diet Educational resources provided: brochure, other (see comments) (appointment with Diabetes Educator) Self management tools provided:   Other plans: Scheduled to see diabetes educator today to further discuss diet.  -Retinal camera suggestive of NPDR. Referral given for ophthalmology, will see Dr. Katy Fitch later this month.  -Return in 1 week -Recheck BMP at that visit. If Cr continues to be normal, can likely increase Metformin to 1000 mg bid if patient is tolerating well.  -Patient somewhat reluctant to start insulin, however, still a likely possibility that she may require at least basal insulin. Will hold off for now given good compliance with checking sugars, slight improvement with Metformin 500 daily, and patient motivation to improve diet and physical activity.  -Check urine microalbumin/Cr + foot exam at next clinic visit.

## 2015-06-16 NOTE — Assessment & Plan Note (Signed)
BP Readings from Last 3 Encounters:  06/15/15 157/71  06/08/15 151/72  08/22/13 134/55    Lab Results  Component Value Date   NA 138 06/15/2015   K 4.3 06/15/2015   CREATININE 1.10* 06/15/2015    Assessment: Blood pressure control:  Elevated Progress toward BP goal:  unable to assess Comments: Patient brought her medications today, appears that she takes Norvasc 10 mg daily and Coreg 25 mg bid. Given her relatively labile renal function, don't think that ACEI/ARB is reasonable choice at this time, although she would gain significant benefit given CHF, DM type II, and CKD. Will have to follow renal function closely and considering adding low dose ARB in the future.   Plan: Medications:  continue current medications; Norvasc 10 mg daily + Coreg 25 mg bid. ADD Spironolactone 12.5 mg daily for now given BP and systolic CHF benefit (EF [id number]).  Educational resources provided: brochure Self management tools provided:   Other plans: RTC in 1 week. Repeat BMP. Plan to start low dose ARB at some point in the future if renal function stabilizes and a good baseline is established.

## 2015-06-16 NOTE — Assessment & Plan Note (Signed)
Cr improved to 1.1, likely due to improvement in blood sugars and less osmotic diuresis related to very high blood sugars. Will need close monitoring of renal function while no Metformin. -RTC in 1 week, check BMP at that time.

## 2015-06-18 NOTE — Progress Notes (Signed)
Internal Medicine Clinic Attending  Case discussed with Dr. Jones soon after the resident saw the patient.  We reviewed the resident's history and exam and pertinent patient test results.  I agree with the assessment, diagnosis, and plan of care documented in the resident's note. 

## 2015-06-22 ENCOUNTER — Ambulatory Visit (INDEPENDENT_AMBULATORY_CARE_PROVIDER_SITE_OTHER): Payer: BLUE CROSS/BLUE SHIELD | Admitting: Internal Medicine

## 2015-06-22 ENCOUNTER — Encounter: Payer: Self-pay | Admitting: Internal Medicine

## 2015-06-22 VITALS — BP 150/74 | HR 67 | Temp 97.9°F | Ht 63.0 in | Wt 175.4 lb

## 2015-06-22 DIAGNOSIS — E113299 Type 2 diabetes mellitus with mild nonproliferative diabetic retinopathy without macular edema, unspecified eye: Secondary | ICD-10-CM

## 2015-06-22 DIAGNOSIS — E1165 Type 2 diabetes mellitus with hyperglycemia: Principal | ICD-10-CM

## 2015-06-22 DIAGNOSIS — I130 Hypertensive heart and chronic kidney disease with heart failure and stage 1 through stage 4 chronic kidney disease, or unspecified chronic kidney disease: Secondary | ICD-10-CM | POA: Diagnosis not present

## 2015-06-22 DIAGNOSIS — N189 Chronic kidney disease, unspecified: Secondary | ICD-10-CM

## 2015-06-22 DIAGNOSIS — Z0000 Encounter for general adult medical examination without abnormal findings: Secondary | ICD-10-CM

## 2015-06-22 DIAGNOSIS — E1151 Type 2 diabetes mellitus with diabetic peripheral angiopathy without gangrene: Secondary | ICD-10-CM | POA: Diagnosis not present

## 2015-06-22 DIAGNOSIS — E1122 Type 2 diabetes mellitus with diabetic chronic kidney disease: Secondary | ICD-10-CM

## 2015-06-22 DIAGNOSIS — Z7984 Long term (current) use of oral hypoglycemic drugs: Secondary | ICD-10-CM

## 2015-06-22 DIAGNOSIS — I10 Essential (primary) hypertension: Secondary | ICD-10-CM

## 2015-06-22 DIAGNOSIS — IMO0002 Reserved for concepts with insufficient information to code with codable children: Secondary | ICD-10-CM

## 2015-06-22 DIAGNOSIS — Z23 Encounter for immunization: Secondary | ICD-10-CM

## 2015-06-22 DIAGNOSIS — I5042 Chronic combined systolic (congestive) and diastolic (congestive) heart failure: Secondary | ICD-10-CM

## 2015-06-22 LAB — GLUCOSE, CAPILLARY: Glucose-Capillary: 100 mg/dL — ABNORMAL HIGH (ref 65–99)

## 2015-06-22 NOTE — Patient Instructions (Signed)
1. Please schedule a follow up appointment for 2 weeks.   2. Please take all medications as previously prescribed.  GOOD JOB!  3. If you have worsening of your symptoms or new symptoms arise, please call the clinic [phone]), or go to the ER immediately if symptoms are severe.  You have done a great job in taking all your medications. Please continue to do this.

## 2015-06-22 NOTE — Progress Notes (Signed)
   Subjective:   Patient ID: [name] female   DOB: [birth year] 60 y.o.   MRN: [id number]  HPI: [name] is a 60 y.o. female w/ PMHx of HTN, DM type II, PVD, and chronic combined CHF, presents to the clinic today for follow up regarding her DM type II. Patient is doing extremely well today, says her sugars are controlled for the first time she can remember. Average CBG's are in the low 100's, she feels great, eating better, tolerating her medications. No symptoms of hypoglycemia, no CBG's measured that were low. Taking medications as prescribed. Scheduled for ophthalmology appointment tomorrow.   Current Outpatient Prescriptions  Medication Sig Dispense Refill  . amLODipine (NORVASC) 10 MG tablet Take 1 tablet (10 mg total) by mouth daily.    Marland Kitchen aspirin EC 81 MG tablet Take 81 mg by mouth daily.    Marland Kitchen BAYER MICROLET LANCETS lancets Check blood sugar three times a day. ICD 10: E11.51 100 each 12  . Blood Glucose Monitoring Suppl (CONTOUR NEXT EZ MONITOR) w/Device KIT Check blood sugar three times a day. ICD 10: E11.51 1 kit 0  . carvedilol (COREG) 25 MG tablet Take 25 mg by mouth 2 (two) times daily with a meal.    . glipiZIDE (GLUCOTROL) 10 MG tablet Take 10 mg by mouth 2 (two) times daily.    Marland Kitchen glucose blood (BAYER CONTOUR NEXT TEST) test strip Check blood sugar three times a day. ICD 10: E11.51 100 each 12  . glucose blood (RELION CONFIRM/MICRO TEST) test strip Please check blood sugar 3 times daily ICD10 code: E11.51 100 each 12  . HYDROcodone-acetaminophen (NORCO/VICODIN) 5-325 MG per tablet Take 1 tablet by mouth 4 (four) times daily as needed for moderate pain.    . metFORMIN (GLUCOPHAGE) 500 MG tablet Take 1 tablet (500 mg total) by mouth 2 (two) times daily with a meal. 30 tablet 2  . spironolactone (ALDACTONE) 25 MG tablet Take 0.5 tablets (12.5 mg total) by mouth daily. 30 tablet 2   No current facility-administered medications for this visit.   Review of  Systems  General: Denies fever, diaphoresis, appetite change, and fatigue.  Respiratory: Denies SOB, cough, and wheezing.   Cardiovascular: Denies chest pain and palpitations.  Gastrointestinal: Denies nausea, vomiting, abdominal pain, and diarrhea Musculoskeletal: Denies myalgias, arthralgias, back pain, and gait problem.  Neurological: Denies dizziness, syncope, weakness, lightheadedness, and headaches.  Psychiatric/Behavioral: Denies mood changes, sleep disturbance, and agitation.   Objective:   Physical Exam: Filed Vitals:   06/22/15 1456  BP: 150/74  Pulse: 67  Temp: 97.9 F (36.6 C)  TempSrc: Oral  Height: '5\' 3"'$  (1.6 m)  Weight: 175 lb 6.4 oz (79.561 kg)  SpO2: 100%    General: Overweight AA female, alert, cooperative, NAD. HEENT: PERRL, EOMI. Moist mucus membranes Neck: Full range of motion without pain, supple, no lymphadenopathy or carotid bruits Lungs: Clear to ascultation bilaterally, normal work of respiration, no wheezes, rales, rhonchi Heart: RRR, no murmurs, gallops, or rubs Abdomen: Soft, non-tender, non-distended, BS + Extremities: No cyanosis, clubbing, or edema Neurologic: Alert & oriented x3, cranial nerves II-XII intact, strength grossly intact, sensation intact to light touch   Assessment & Plan:   Please see problem based assessment and plan.

## 2015-06-23 LAB — MICROALBUMIN / CREATININE URINE RATIO
CREATININE, UR: 219 mg/dL
MICROALB/CREAT RATIO: 8.7 mg/g creat (ref 0.0–30.0)
Microalbumin, Urine: 19 ug/mL

## 2015-06-23 LAB — HM DIABETES EYE EXAM

## 2015-06-23 NOTE — Progress Notes (Signed)
Internal Medicine Clinic Attending  Case discussed with Dr. Jones soon after the resident saw the patient.  We reviewed the resident's history and exam and pertinent patient test results.  I agree with the assessment, diagnosis, and plan of care documented in the resident's note. 

## 2015-06-23 NOTE — Assessment & Plan Note (Signed)
Given flu shot today, urine microalb/cr.

## 2015-06-23 NOTE — Assessment & Plan Note (Signed)
Scheduled for eye appointment on 06/23/15 to further assess NPDR.

## 2015-06-23 NOTE — Assessment & Plan Note (Signed)
No changes today given significant improvement in blood sugar control. Patient tolerating medications, CBG's in the low 100's on average. Feels great.  -Continue Metformin 500 mg bid. Will hopefully be able to maximize this dose in the future based on renal function.  -Continue Glipizide 10 mg bid -RTC in 2 weeks for follow up. Check BMP at that time.

## 2015-06-23 NOTE — Assessment & Plan Note (Signed)
BP Readings from Last 3 Encounters:  06/22/15 150/74  06/15/15 157/71  06/08/15 151/72    Lab Results  Component Value Date   NA 138 06/15/2015   K 4.3 06/15/2015   CREATININE 1.10* 06/15/2015    Assessment: Blood pressure control:  Mild elevation Progress toward BP goal:   Improved Comments: Started Spironolactone 12.5 mg daily last visit given low EF and poor control on previous regimen.   Plan: Medications:  continue current medications today; Spironolactone 12.5 mg daily, Coreg 25 mg bid, and Norvasc 10 mg daily.  Other plans: May be able to increase Spironolactone to 25 mg daily at next visit. Will check BMP at that time. Ideally, she should be on an ACEI or ARB given her combined CHF, however, her CKD has been rather labile in the recent past. No changes today given significant improvement in other medical problems, will optimize BP control at next visit.

## 2015-06-29 MED ORDER — METFORMIN HCL 500 MG PO TABS: 500.0000 mg | ORAL_TABLET | Freq: Two times a day (BID) | ORAL | Status: DC

## 2015-06-29 NOTE — Addendum Note (Signed)
Addended byCorky Sox on: 06/29/2015 11:40 AM   Modules accepted: Orders

## 2015-07-01 ENCOUNTER — Encounter: Payer: Self-pay | Admitting: *Deleted

## 2015-07-13 ENCOUNTER — Ambulatory Visit (INDEPENDENT_AMBULATORY_CARE_PROVIDER_SITE_OTHER): Payer: BLUE CROSS/BLUE SHIELD | Admitting: Dietician

## 2015-07-13 VITALS — Wt 171.4 lb

## 2015-07-13 DIAGNOSIS — E1165 Type 2 diabetes mellitus with hyperglycemia: Secondary | ICD-10-CM | POA: Diagnosis not present

## 2015-07-13 DIAGNOSIS — E1151 Type 2 diabetes mellitus with diabetic peripheral angiopathy without gangrene: Secondary | ICD-10-CM

## 2015-07-13 DIAGNOSIS — Z713 Dietary counseling and surveillance: Secondary | ICD-10-CM

## 2015-07-13 DIAGNOSIS — Z7984 Long term (current) use of oral hypoglycemic drugs: Secondary | ICD-10-CM

## 2015-07-13 DIAGNOSIS — IMO0002 Reserved for concepts with insufficient information to code with codable children: Secondary | ICD-10-CM

## 2015-07-13 DIAGNOSIS — Z6830 Body mass index (BMI) 30.0-30.9, adult: Secondary | ICD-10-CM | POA: Diagnosis not present

## 2015-07-13 NOTE — Patient Instructions (Signed)
Please make a follow up appointment in late April or May.   Your blood sugars are excellent.  Your weight is decreased by 4 pound sin 3 weeks! Way to go!!  See if you can find something to help you eat less french fries at [facility]- can you take a snack- like applesauces, lite peaches.  Call anytime for questions or concerns.  Butch Penny  [phone]

## 2015-07-13 NOTE — Progress Notes (Signed)
  Medical Nutrition Therapy:  Appt start time: [id number] end time:  1615. Visit # 2  Assessment:  Primary concerns today: meter and meal planning  [name] is here with her 59 year old daughter. She has been chekcing her blood sugar regulary and trying to eat smaller portions and more fruits and vegetables. Her children and spouse like to eat fast food, so she eats it with them. She thinks maybe the french fries are what increase her blood sugar later in the day.   ANTHROPOMETRICS: weight-171.4#, height-63", BMI-30.4- obese class 1 WEIGHT HISTORY:need to assess at future visit SLEEP:need to assess at future visit MEDICATIONS:metformin and glipizide at maximum doses as of today.  BLOOD SUGAR: 142 today in the am.  DIETARY INTAKE: Usual eating pattern includes 2-3 meals and 2-3 snacks per day. Everyday foods include grits, potatoes.  Avoided foods include tries to limit fried foods   1 fruit per day,  Lunch fast food- [employer]- chicken sandwich or double cheese burger and small fry that she doesn't;t finish  with water  a salad for an afternoon snack  Dinner yesterday: Oxtails, with gravy, brown rice and green beans, water Beverages: water  Usual physical activity: more activity now that her blood sugars are not high and she feels better, but her right hip is painful so this limits her weight bearing activity.   Progress Towards Goal(s):  In progress.   Nutritional Diagnosis:  NB-1.6 Limited adherence to nutrition-related recommendations As related to lack of support, self confidence and possilby competing priorities is improving  As evidenced by her verbalized knowledge and indication that she is willing to say "no" more, find ways to care for herself and her blood sugar improvements and weight loss.    Intervention:  Nutrition education about how to interpret meter download, how to care for feet, kidney, lower blood pressure and reinforcement of meal planning. Began conversation about  support- Coordination of care: thrid medication may be indicated if future A1C > 7%  Teaching Method Utilized: Visual,,Auditory, Hands on Handouts given during visit include:sample meter, what to eat  Barriers to learning/adherence to lifestyle change: support.  Demonstrated degree of understanding via:  Teach Back   Monitoring/Evaluation:  Dietary intake, exercise, meter, and body weight 4-6 weeks.

## 2015-07-20 ENCOUNTER — Encounter: Payer: Self-pay | Admitting: Internal Medicine

## 2015-07-20 ENCOUNTER — Ambulatory Visit: Payer: BLUE CROSS/BLUE SHIELD | Admitting: Internal Medicine

## 2015-07-20 ENCOUNTER — Ambulatory Visit (INDEPENDENT_AMBULATORY_CARE_PROVIDER_SITE_OTHER): Payer: BLUE CROSS/BLUE SHIELD | Admitting: Internal Medicine

## 2015-07-20 VITALS — BP 119/55 | HR 77 | Temp 98.8°F | Ht 63.0 in | Wt 176.7 lb

## 2015-07-20 DIAGNOSIS — I10 Essential (primary) hypertension: Secondary | ICD-10-CM

## 2015-07-20 DIAGNOSIS — I5042 Chronic combined systolic (congestive) and diastolic (congestive) heart failure: Secondary | ICD-10-CM

## 2015-07-20 DIAGNOSIS — I110 Hypertensive heart disease with heart failure: Secondary | ICD-10-CM | POA: Diagnosis not present

## 2015-07-20 DIAGNOSIS — E1151 Type 2 diabetes mellitus with diabetic peripheral angiopathy without gangrene: Secondary | ICD-10-CM

## 2015-07-20 DIAGNOSIS — E113292 Type 2 diabetes mellitus with mild nonproliferative diabetic retinopathy without macular edema, left eye: Secondary | ICD-10-CM | POA: Diagnosis not present

## 2015-07-20 DIAGNOSIS — R0981 Nasal congestion: Secondary | ICD-10-CM | POA: Insufficient documentation

## 2015-07-20 DIAGNOSIS — E1136 Type 2 diabetes mellitus with diabetic cataract: Secondary | ICD-10-CM

## 2015-07-20 DIAGNOSIS — E1165 Type 2 diabetes mellitus with hyperglycemia: Principal | ICD-10-CM

## 2015-07-20 DIAGNOSIS — Z0000 Encounter for general adult medical examination without abnormal findings: Secondary | ICD-10-CM

## 2015-07-20 DIAGNOSIS — IMO0002 Reserved for concepts with insufficient information to code with codable children: Secondary | ICD-10-CM

## 2015-07-20 DIAGNOSIS — Z7984 Long term (current) use of oral hypoglycemic drugs: Secondary | ICD-10-CM

## 2015-07-20 DIAGNOSIS — E113299 Type 2 diabetes mellitus with mild nonproliferative diabetic retinopathy without macular edema, unspecified eye: Secondary | ICD-10-CM

## 2015-07-20 LAB — GLUCOSE, CAPILLARY: GLUCOSE-CAPILLARY: 70 mg/dL (ref 65–99)

## 2015-07-20 MED ORDER — GUAIFENESIN ER 600 MG PO TB12: 600.0000 mg | ORAL_TABLET | Freq: Two times a day (BID) | ORAL | Status: DC

## 2015-07-20 MED ORDER — ATORVASTATIN CALCIUM 40 MG PO TABS: 40.0000 mg | ORAL_TABLET | Freq: Every day | ORAL | Status: DC

## 2015-07-20 NOTE — Progress Notes (Signed)
Subjective:   Patient ID: [name] female   DOB: [birth year] 60 y.o.   MRN: [id number]  HPI: [name] is a 60 y.o. female w/ PMHx of HTN, DM type II, PVD, and chronic combined CHF, presents to the clinic today for follow up regarding her DM type II. Patient says she thinks she is getting a bit of a cold form her daughter who was recently ill. She admits to some mild nasal congestion and a dry cough, but otherwise has no significant complaints. Says her DM is well controlled currently, her CBG's are better than they have been in years. Per review of her blood glucose, her average sugars are 114 in the AM (fasting), 135 midday, and 148 in the evening. She is currently taking glipizide 10 mg bid + Metformin 500 mg bid. 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 She has been taking her BP medications without any issues. BP well controlled today.   Current Outpatient Prescriptions  Medication Sig Dispense Refill  . amLODipine (NORVASC) 10 MG tablet Take 1 tablet (10 mg total) by mouth daily.    Marland Kitchen aspirin EC 81 MG tablet Take 81 mg by mouth daily.    Marland Kitchen BAYER MICROLET LANCETS lancets Check blood sugar three times a day. ICD 10: E11.51 100 each 12  . Blood Glucose Monitoring Suppl (CONTOUR NEXT EZ MONITOR) w/Device KIT Check blood sugar three times a day. ICD 10: E11.51 1 kit 0  . carvedilol (COREG) 25 MG tablet Take 25 mg by mouth 2 (two) times daily with a meal.    . glipiZIDE (GLUCOTROL) 10 MG tablet Take 10 mg by mouth 2 (two) times daily.    Marland Kitchen glucose blood (BAYER CONTOUR NEXT TEST) test strip Check blood sugar three times a day. ICD 10: E11.51 100 each 12  . glucose blood (RELION CONFIRM/MICRO TEST) test strip Please check blood sugar 3 times daily ICD10 code: E11.51 100 each 12  . HYDROcodone-acetaminophen (NORCO/VICODIN) 5-325 MG per tablet Take 1 tablet by  mouth 4 (four) times daily as needed for moderate pain.    . metFORMIN (GLUCOPHAGE) 500 MG tablet Take 1 tablet (500 mg total) by mouth 2 (two) times daily with a meal. 60 tablet 5  . spironolactone (ALDACTONE) 25 MG tablet Take 0.5 tablets (12.5 mg total) by mouth daily. 30 tablet 2   No current facility-administered medications for this visit.   Review of Systems  General: Denies fever, diaphoresis, appetite change, and fatigue.  Respiratory: Positive for mild dry cough. Denies SOB, and wheezing.   Cardiovascular: Denies chest pain and palpitations.  Gastrointestinal: Denies nausea, vomiting, abdominal pain, and diarrhea Musculoskeletal: Denies myalgias, arthralgias, back pain, and gait problem.  Neurological: Denies dizziness, syncope, weakness, lightheadedness, and headaches.  Psychiatric/Behavioral: Denies mood changes, sleep disturbance, and agitation.   Objective:   Physical Exam: Filed Vitals:   07/20/15 1611  BP: 119/55  Pulse: 77  Temp: 98.8 F (37.1 C)  TempSrc: Oral  Height: '5\' 3"'  (1.6 m)  Weight: 176 lb 11.2 oz (80.151 kg)  SpO2: 97%    General: Overweight AA female, alert, cooperative, NAD. HEENT: PERRL, EOMI. Moist mucus membranes Neck: Full range of motion without pain, supple, no lymphadenopathy or carotid bruits Lungs: Clear to ascultation bilaterally, normal work of respiration, no wheezes, rales, rhonchi Heart: RRR, no murmurs, gallops, or  rubs Abdomen: Soft, non-tender, non-distended, BS + Extremities: No cyanosis, clubbing, or edema Neurologic: Alert & oriented x3, cranial nerves II-XII intact, strength grossly intact, sensation intact to light touch   Assessment & Plan:   Please see problem based assessment and plan.

## 2015-07-20 NOTE — Patient Instructions (Signed)
1. Please make a follow up appointment for 6 weeks.   2. Please take all medications as previously prescribed with the following changes:  Start taking Lipitor 40 mg once at night.   Make sure you eat with your Glipizide.   Take Mucinex for your congestion.   3. If you have worsening of your symptoms or new symptoms arise, please call the clinic [phone]), or go to the ER immediately if symptoms are severe.  You have done a great job in taking all your medications. Please continue to do this.

## 2015-07-21 LAB — BMP8+ANION GAP
ANION GAP: 16 mmol/L (ref 10.0–18.0)
BUN/Creatinine Ratio: 22 (ref 9–23)
BUN: 31 mg/dL — ABNORMAL HIGH (ref 6–24)
CALCIUM: 9.2 mg/dL (ref 8.7–10.2)
CO2: 19 mmol/L (ref 18–29)
CREATININE: 1.39 mg/dL — AB (ref 0.57–1.00)
Chloride: 104 mmol/L (ref 96–106)
GFR calc Af Amer: 48 mL/min/{1.73_m2} — ABNORMAL LOW (ref >59)
GFR, EST NON AFRICAN AMERICAN: 42 mL/min/{1.73_m2} — AB (ref >59)
Glucose: 84 mg/dL (ref 65–99)
Potassium: 4.3 mmol/L (ref 3.5–5.2)
Sodium: 139 mmol/L (ref 134–144)

## 2015-07-21 NOTE — Assessment & Plan Note (Signed)
Please referral for screening colonoscopy (has never had). Also, ASCVD risk calls for high intensity statin. Started Lipitor 40 mg qhs.

## 2015-07-21 NOTE — Assessment & Plan Note (Signed)
CBG's very well controlled currently.  -Continue Metformin 500 mg bid. Will need to closely monitor renal function while she is no this given her borderline GFR. No AG on BMP, Cr at baseline.  -Continue Glipizide 10 mg bid. May need to decrease dose in the near future. Had one CBG of 68 in the evening over the past 2-3 weeks, says it was because she didn't eat a big meal. May be appropriate to change to 10 in AM 5 in PM if she has any more lows.  -RTC in 6 weeks for follow up

## 2015-07-21 NOTE — Assessment & Plan Note (Signed)
Has some sinus congestion and a mild dry cough. Says she thinks she caught a cold from her daughter. No other complaints.  -Guaifenesin bid prn

## 2015-07-21 NOTE — Assessment & Plan Note (Signed)
BP Readings from Last 3 Encounters:  07/20/15 119/55  06/22/15 150/74  06/15/15 157/71    Lab Results  Component Value Date   NA 139 07/20/2015   K 4.3 07/20/2015   CREATININE 1.39* 07/20/2015    Assessment: Blood pressure control:  Well controlled Progress toward BP goal:   At goal Comments: Current regimen: Coreg 25 mg bid, Norvasc 10 mg daily, and Spironolactone 12.5 mg daily. Cr stable, K normal, BP controlled. Added spironolactone at last visit.   Plan: Medications:  continue current medications Other plans: Recheck BMP at next visit to ensure normal electrolytes and renal function

## 2015-07-21 NOTE — Progress Notes (Signed)
Internal Medicine Clinic Attending  Case discussed with Dr. Jones at the time of the visit.  We reviewed the resident's history and exam and pertinent patient test results.  I agree with the assessment, diagnosis, and plan of care documented in the resident's note.  

## 2015-07-21 NOTE — Assessment & Plan Note (Signed)
Appears euvolemic on exam. Taking Coreg 25 mg bid, Norvasc 10 mg daily, Lipitor, ASA, and Spironolactone 12.5 mg daily. Cr slightly increased from previous but still at baseline. BP controlled.  -Continue medications as above.

## 2015-07-21 NOTE — Assessment & Plan Note (Signed)
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 Following with Dr. Herbert Deaner. Also having cataracts repaired by Dr. Katy Fitch in the near future as well. Requested records.

## 2015-08-17 ENCOUNTER — Telehealth: Payer: Self-pay | Admitting: Pharmacist

## 2015-08-17 NOTE — Telephone Encounter (Signed)
Calling to follow up after starting atorvastatin 07/20/15. Patient reports tolerability and states he has no concerns at this time. Consistent refills per [facility]

## 2015-08-26 ENCOUNTER — Encounter: Payer: Self-pay | Admitting: *Deleted

## 2015-09-03 ENCOUNTER — Encounter: Payer: BLUE CROSS/BLUE SHIELD | Admitting: Internal Medicine

## 2015-09-08 ENCOUNTER — Telehealth: Payer: Self-pay | Admitting: Dietician

## 2015-09-08 NOTE — Telephone Encounter (Signed)
I think all of those things sound okay. She is trying very hard and she should be proud of herself for that.   Natasha Bence, MD PGY-3, Internal Medicine Pager: [phone]

## 2015-09-08 NOTE — Telephone Encounter (Signed)
[name] called to ask if the detox "program she started (tea and healthy eating) is okay to do: Using Tea yogi dandelion cleansing tea- 2 bags 2-3 times a day with no sugar, is eating some (Ate a kale salad with ken's [place] dressing, guacamole) Sugars were in the 200s,  "a little high", had been eating differently due to stress causing her blood sugars to be higher, this am it was 140s, she hopes to keep it mostly in the 100s  per Dr. Ronnald Ramp recommendation.  She is also taking Desma Mcgregor brand turmeric 538 mg one daily that the pharmacist told her was okay and also taking cinnamon daily in coffee or tea or as a capsule- this seems to be helping with pain, tylenol was not helping...  Cut back testing because of cost 64$.  Discussed importance of eating well balanced meals three time a day during her detox,  checking blood sugars especially if she feels funny, carrying treatment for low blood sugar with her at all times ( last A1C was > 12%) and bringing in the bottles of supplements to her next office visit. She verbalized understanding.

## 2015-10-14 ENCOUNTER — Encounter: Payer: BLUE CROSS/BLUE SHIELD | Admitting: Internal Medicine

## 2015-10-14 ENCOUNTER — Encounter: Payer: BLUE CROSS/BLUE SHIELD | Admitting: Dietician

## 2015-10-28 ENCOUNTER — Encounter: Payer: BLUE CROSS/BLUE SHIELD | Admitting: Internal Medicine

## 2015-10-28 ENCOUNTER — Encounter: Payer: Self-pay | Admitting: Internal Medicine

## 2015-12-17 ENCOUNTER — Other Ambulatory Visit: Payer: Self-pay | Admitting: *Deleted

## 2015-12-17 DIAGNOSIS — I5042 Chronic combined systolic (congestive) and diastolic (congestive) heart failure: Secondary | ICD-10-CM

## 2015-12-17 DIAGNOSIS — I10 Essential (primary) hypertension: Secondary | ICD-10-CM

## 2015-12-21 MED ORDER — SPIRONOLACTONE 25 MG PO TABS: 12.5000 mg | ORAL_TABLET | Freq: Every day | ORAL | 2 refills | Status: DC

## 2016-01-14 ENCOUNTER — Encounter: Payer: Self-pay | Admitting: Internal Medicine

## 2016-01-19 ENCOUNTER — Telehealth: Payer: Self-pay | Admitting: Dietician

## 2016-01-19 NOTE — Telephone Encounter (Signed)
Spoke with patient: she does not want to come in to see a doctor and is not caring for herself because she lost her insurance. She is sharing some of her husband's diabetes medicines and would like to work something out so she can take care of her diabetes. She plans to apply for Medicaid soon. I informed her that we have a hospital write off that is like insurances for those without insurance and a new low cost medication plan  Diabetes pay needs to apply for medicaid, no social security anymore.   Will ask financial counselor to call her and mail her the financial counselor information.

## 2016-01-27 ENCOUNTER — Ambulatory Visit: Payer: BLUE CROSS/BLUE SHIELD

## 2016-02-01 ENCOUNTER — Ambulatory Visit: Payer: Self-pay

## 2016-02-02 ENCOUNTER — Encounter: Payer: Self-pay | Admitting: *Deleted

## 2016-02-22 ENCOUNTER — Telehealth: Payer: Self-pay | Admitting: Internal Medicine

## 2016-02-22 NOTE — Telephone Encounter (Signed)
APT. REMINDER CALL, LMTCB °

## 2016-02-23 ENCOUNTER — Ambulatory Visit: Payer: Self-pay

## 2016-03-08 ENCOUNTER — Ambulatory Visit (INDEPENDENT_AMBULATORY_CARE_PROVIDER_SITE_OTHER): Payer: Self-pay | Admitting: Internal Medicine

## 2016-03-08 ENCOUNTER — Encounter: Payer: Self-pay | Admitting: Internal Medicine

## 2016-03-08 ENCOUNTER — Ambulatory Visit: Payer: Self-pay | Admitting: Pharmacist

## 2016-03-08 ENCOUNTER — Telehealth: Payer: Self-pay

## 2016-03-08 VITALS — BP 120/77 | HR 71 | Temp 98.2°F | Wt 167.8 lb

## 2016-03-08 DIAGNOSIS — E1151 Type 2 diabetes mellitus with diabetic peripheral angiopathy without gangrene: Secondary | ICD-10-CM

## 2016-03-08 DIAGNOSIS — IMO0002 Reserved for concepts with insufficient information to code with codable children: Secondary | ICD-10-CM

## 2016-03-08 DIAGNOSIS — I10 Essential (primary) hypertension: Secondary | ICD-10-CM

## 2016-03-08 DIAGNOSIS — L989 Disorder of the skin and subcutaneous tissue, unspecified: Secondary | ICD-10-CM

## 2016-03-08 DIAGNOSIS — I5042 Chronic combined systolic (congestive) and diastolic (congestive) heart failure: Secondary | ICD-10-CM

## 2016-03-08 DIAGNOSIS — Z598 Other problems related to housing and economic circumstances: Secondary | ICD-10-CM

## 2016-03-08 DIAGNOSIS — Z0000 Encounter for general adult medical examination without abnormal findings: Secondary | ICD-10-CM

## 2016-03-08 DIAGNOSIS — Z23 Encounter for immunization: Secondary | ICD-10-CM

## 2016-03-08 DIAGNOSIS — M25571 Pain in right ankle and joints of right foot: Secondary | ICD-10-CM

## 2016-03-08 DIAGNOSIS — E1165 Type 2 diabetes mellitus with hyperglycemia: Secondary | ICD-10-CM

## 2016-03-08 DIAGNOSIS — Z599 Problem related to housing and economic circumstances, unspecified: Secondary | ICD-10-CM | POA: Insufficient documentation

## 2016-03-08 MED ORDER — CARVEDILOL 25 MG PO TABS: 25.0000 mg | ORAL_TABLET | Freq: Two times a day (BID) | ORAL | 0 refills | Status: DC

## 2016-03-08 MED ORDER — ATORVASTATIN CALCIUM 40 MG PO TABS: 40.0000 mg | ORAL_TABLET | Freq: Every day | ORAL | 0 refills | Status: DC

## 2016-03-08 MED ORDER — METFORMIN HCL 500 MG PO TABS: 500.0000 mg | ORAL_TABLET | Freq: Every day | ORAL | 0 refills | Status: DC

## 2016-03-08 MED ORDER — TRAMADOL HCL 50 MG PO TABS: 50.0000 mg | ORAL_TABLET | Freq: Two times a day (BID) | ORAL | 0 refills | Status: DC | PRN

## 2016-03-08 MED FILL — CARVEDILOL 25 MG TABLET: 25 | 30 days supply | Qty: 60 | Fill #0

## 2016-03-08 MED FILL — traMADol HCL 50 MG TABS: 50 | 5 days supply | Qty: 10 | Fill #0

## 2016-03-08 MED FILL — ATORVASTATIN 40 MG TABLET: 40 | 30 days supply | Qty: 30 | Fill #0

## 2016-03-08 MED FILL — metFORMIN HCL 500 MG TABS: 500 | 30 days supply | Qty: 30 | Fill #0

## 2016-03-08 NOTE — Progress Notes (Signed)
   CC: right fifth toe pain  HPI:  [name] is a 60 y.o. woman with history noted below that presents to the internal medicine clinic for right fifth toe pain that started 3 months ago. She denies any trauma to the area and first noticed swelling over her right fifth toe 3 months ago that opened up and developed a scab. The wound has not been draining since the scab appeared. She has tried Neosporin and epsom salt bath with little benefit.  She has associated pain to the toe that is intermittent and throbbing in sensation.  She also states she has not been taking her medications for 1 month due to cost.  Past Medical History:  Diagnosis Date  . Arthritis   . CHF (congestive heart failure) ([place])    2014...@ [facility]  . Diabetes mellitus    diagnosed 2001  . Hypertension   . PVD (peripheral vascular disease) ([place])     Review of Systems:  Review of Systems  Constitutional: Negative for fever.  Cardiovascular: Negative for leg swelling.  Musculoskeletal: Negative for falls.  Skin: Negative for itching.  All other systems reviewed and are negative.    Physical Exam:  There were no vitals filed for this visit. Physical Exam  Constitutional: She is well-developed, well-nourished, and in no distress.  Cardiovascular: Normal rate, regular rhythm and normal heart sounds.  Exam reveals no gallop and no friction rub.   No murmur heard. Pulmonary/Chest: Effort normal and breath sounds normal. No respiratory distress. She has no wheezes. She has no rales.  Musculoskeletal: She exhibits no edema.  Skin:  Brown irregular overgrowth of the 5th right toe.  No drainage.  Tender to palpation     Assessment & Plan:   See encounters tab for problem based medical decision making.   Patient seen with Dr. Daryll Drown

## 2016-03-08 NOTE — Patient Instructions (Addendum)
[name], It was a pleasure meeting you today. A referral to dermatology has been made for your toe lesion Please follow up with your primary care doctor for your appointment on 11/29

## 2016-03-08 NOTE — Progress Notes (Signed)
Internal Medicine Clinic Attending  I saw and evaluated the patient.  I personally confirmed the key portions of the history and exam documented by Dr. Heber Cornwall and I reviewed pertinent patient test results.  The assessment, diagnosis, and plan were formulated together and I agree with the documentation in the resident's note.  The area on her toe is very interesting.  My main differential is non healing wound due to her vascular disease and uncontrolled DM.  This could also be an atypical wart vs a skin cancer.  She has limited access to specialists, will refer to dermatology at this time for possible biopsy.

## 2016-03-08 NOTE — Telephone Encounter (Signed)
Pt called upset that she had to pay $21.00 for tramadol at [facility], states she has an orange card but has not rec'd the actual card yet, she states she had to borrow the $21.00 from her husband and does not have the money to pay him back. Dr Maudie Mercury, do we need to reimburse her?

## 2016-03-08 NOTE — Assessment & Plan Note (Signed)
Assessment: Financial difficulties in obtaining prescription medications Patient has the orange card. She states that she has been out of her medications for 1 month due to cost.  Plan -In office visit with Dr. Maudie Mercury to help with financial options for medications.

## 2016-03-08 NOTE — Telephone Encounter (Signed)
Please call pt back regarding meds.  

## 2016-03-08 NOTE — Assessment & Plan Note (Addendum)
Assessment: Right fifth toe lesion Patient has a 3 month history of toe lesion. She stated that she had swelling in the right fifth toe that opened up, drained that later scabbed over. The area is brown in color with irregular borders. She has associated pain that is intermittent. Patient has a history of diabetes and peripheral vascular disease. With her chronic medical conditions there is concern that she might be having a non healing wound.  However, the area is dry, non draining or erythematous.  Other thoughts for the differential is a wart and melanoma.    Plan - Referral to dermatology - Tramadol 50mg  BID for 5 days

## 2016-03-10 NOTE — Progress Notes (Signed)
S: [name] is a 60 y.o. female reports for help with medication management. Patient did not bring medication bottles. Patient is accompanied by family, who assist at home with medication management.  Allergies  Allergen Reactions  . Plavix [Clopidogrel Bisulfate] Rash   Medication Sig  aspirin EC 81 MG tablet Take 81 mg by mouth daily.  atorvastatin (LIPITOR) 40 MG tablet Take 1 tablet (40 mg total) by mouth daily. IM program  BAYER MICROLET LANCETS lancets Check blood sugar three times a day. ICD 10: E11.51  Blood Glucose Monitoring Suppl (CONTOUR NEXT EZ MONITOR) w/Device KIT Check blood sugar three times a day. ICD 10: E11.51  carvedilol (COREG) 25 MG tablet Take 1 tablet (25 mg total) by mouth 2 (two) times daily with a meal. IM program  Cinnamon 500 MG capsule Take 500 mg by mouth daily.  glucose blood (BAYER CONTOUR NEXT TEST) test strip Check blood sugar three times a day. ICD 10: E11.51  glucose blood (RELION CONFIRM/MICRO TEST) test strip Please check blood sugar 3 times daily ICD10 code: E11.51  guaiFENesin (MUCINEX) 600 MG 12 hr tablet Take 1 tablet (600 mg total) by mouth 2 (two) times daily.  metFORMIN (GLUCOPHAGE) 500 MG tablet Take 1 tablet (500 mg total) by mouth daily. IM program  traMADol (ULTRAM) 50 MG tablet Take 1 tablet (50 mg total) by mouth every 12 (twelve) hours as needed.  Turmeric 500 MG TABS Take by mouth.   Past Medical History:  Diagnosis Date  . Arthritis   . CHF (congestive heart failure) ([place])    2014...@ [facility]  . Diabetes mellitus    diagnosed 2001  . Hypertension   . PVD (peripheral vascular disease) ([place])    Social History   Social History  . Marital status: Married    Spouse name: N/A  . Number of children: N/A  . Years of education: 12,college   Social History Main Topics  . Smoking status: Never Smoker  . Smokeless tobacco: Never Used  . Alcohol use No     Comment: ocassionally  . Drug use: No  . Sexual activity: Yes   Other  Topics Concern  . Not on file   Social History Narrative  . No narrative on file   Family History  Problem Relation Age of Onset  . Diabetes Mother     O:    Component Value Date/Time   CHOL 168 11/08/2012 1331   HDL 45 11/08/2012 1331   TRIG 95 11/08/2012 1331   AST 13 02/22/2013 1046   ALT 10 02/22/2013 1046   NA 139 07/20/2015 1647   K 4.3 07/20/2015 1647   CL 104 07/20/2015 1647   CO2 19 07/20/2015 1647   GLUCOSE 84 07/20/2015 1647   GLUCOSE 695 (HH) 06/08/2015 1100   HGBA1C 12.8 06/08/2015 1105   HGBA1C 9.3 (H) 08/19/2013 1712   BUN 31 (H) 07/20/2015 1647   CREATININE 1.39 (H) 07/20/2015 1647   CREATININE 3.32 (H) 07/17/2013 1717   CALCIUM 9.2 07/20/2015 1647   GFRAA 48 (L) 07/20/2015 1647   GFRAA 49 (L) 11/22/2012 1342   WBC 10.6 (H) 08/22/2013 0500   HGB 7.9 (L) 08/22/2013 0500   HCT 24.2 (L) 08/22/2013 0500   PLT 189 08/22/2013 0500   TSH 0.492 11/08/2012 1331   Ht Readings from Last 2 Encounters:  07/20/15 5' 3" (1.6 m)  06/22/15 5' 3" (1.6 m)   Wt Readings from Last 2 Encounters:  03/08/16 167 lb 12.8 oz (76.1  kg)  07/20/15 176 lb 11.2 oz (80.2 kg)   There is no height or weight on file to calculate BMI. BP Readings from Last 3 Encounters:  03/08/16 120/77  07/20/15 (!) 119/55  06/22/15 (!) 150/74   A/P:  Patient states she has not been taking any medications.  Last medication taken was carvedilol: ran out of this medication x 2 weeks ago.  Restarting all medications with exception of spironolactone, amlodipine, glipizide, and hydrocodone/APAP pending follow-up lab monitoring and upcoming PCP appointment (BP today 120/77 on no medications and history of wavering renal function). Metformin was re-started at 500 mg daily.  Patient states she has been working on dietary modification for HTN and DM management.  Would recommend adding an ACE-I or ARB at follow up if BMET WNL/stable for CHF and if tolerated.  An after visit summary was provided and  patient advised to follow up with PCP or sooner if any changes in condition or questions regarding medications arise. I will continue to assist in the care of this patient as needed   The patient and family verbalized understanding of information provided by repeating back concepts discussed.   30 minutes spent face-to-face with the patient during the encounter. 50% of time spent on education. 50% of time was spent on assessment and plan.

## 2016-03-16 ENCOUNTER — Encounter: Payer: BLUE CROSS/BLUE SHIELD | Admitting: Internal Medicine

## 2016-03-29 ENCOUNTER — Telehealth: Payer: Self-pay | Admitting: Internal Medicine

## 2016-03-29 NOTE — Telephone Encounter (Signed)
APT. REMINDER CALL, LMTCB °

## 2016-03-30 ENCOUNTER — Encounter: Payer: Self-pay | Admitting: Internal Medicine

## 2016-03-30 ENCOUNTER — Ambulatory Visit (INDEPENDENT_AMBULATORY_CARE_PROVIDER_SITE_OTHER): Payer: Self-pay | Admitting: Internal Medicine

## 2016-03-30 ENCOUNTER — Ambulatory Visit (INDEPENDENT_AMBULATORY_CARE_PROVIDER_SITE_OTHER): Payer: Self-pay | Admitting: Dietician

## 2016-03-30 VITALS — BP 150/79 | HR 79 | Temp 97.5°F | Wt 167.6 lb

## 2016-03-30 DIAGNOSIS — L989 Disorder of the skin and subcutaneous tissue, unspecified: Secondary | ICD-10-CM

## 2016-03-30 DIAGNOSIS — E11621 Type 2 diabetes mellitus with foot ulcer: Secondary | ICD-10-CM

## 2016-03-30 DIAGNOSIS — E1165 Type 2 diabetes mellitus with hyperglycemia: Secondary | ICD-10-CM

## 2016-03-30 DIAGNOSIS — IMO0002 Reserved for concepts with insufficient information to code with codable children: Secondary | ICD-10-CM

## 2016-03-30 DIAGNOSIS — E1151 Type 2 diabetes mellitus with diabetic peripheral angiopathy without gangrene: Secondary | ICD-10-CM

## 2016-03-30 DIAGNOSIS — L97511 Non-pressure chronic ulcer of other part of right foot limited to breakdown of skin: Secondary | ICD-10-CM

## 2016-03-30 DIAGNOSIS — Z7984 Long term (current) use of oral hypoglycemic drugs: Secondary | ICD-10-CM

## 2016-03-30 DIAGNOSIS — Z713 Dietary counseling and surveillance: Secondary | ICD-10-CM

## 2016-03-30 DIAGNOSIS — Z9114 Patient's other noncompliance with medication regimen: Secondary | ICD-10-CM

## 2016-03-30 DIAGNOSIS — Z794 Long term (current) use of insulin: Secondary | ICD-10-CM

## 2016-03-30 DIAGNOSIS — I10 Essential (primary) hypertension: Secondary | ICD-10-CM

## 2016-03-30 DIAGNOSIS — Z0000 Encounter for general adult medical examination without abnormal findings: Secondary | ICD-10-CM

## 2016-03-30 LAB — POCT GLYCOSYLATED HEMOGLOBIN (HGB A1C)

## 2016-03-30 LAB — GLUCOSE, CAPILLARY: GLUCOSE-CAPILLARY: 290 mg/dL — AB (ref 65–99)

## 2016-03-30 MED ORDER — AMLODIPINE-ATORVASTATIN 10-40 MG PO TABS: 1.0000 | ORAL_TABLET | Freq: Every day | ORAL | 6 refills | Status: DC

## 2016-03-30 MED ORDER — INSULIN DETEMIR 100 UNIT/ML ~~LOC~~ SOLN: 15.0000 [IU] | Freq: Every day | SUBCUTANEOUS | 11 refills | Status: DC

## 2016-03-30 MED ORDER — TRAMADOL HCL 50 MG PO TABS: 50.0000 mg | ORAL_TABLET | Freq: Two times a day (BID) | ORAL | 0 refills | Status: DC | PRN

## 2016-03-30 MED ORDER — INSULIN DETEMIR 100 UNIT/ML ~~LOC~~ SOLN: 15.0000 [IU] | Freq: Every day | SUBCUTANEOUS | Status: DC

## 2016-03-30 MED ORDER — METFORMIN HCL 1000 MG PO TABS: 1000.0000 mg | ORAL_TABLET | Freq: Two times a day (BID) | ORAL | 6 refills | Status: DC

## 2016-03-30 MED ORDER — CARVEDILOL 12.5 MG PO TABS
12.5000 mg | ORAL_TABLET | Freq: Two times a day (BID) | ORAL | 6 refills | Status: DC
Start: 2016-03-30 — End: 2016-04-22

## 2016-03-30 MED FILL — CARVEDILOL 12.5 MG TABLET: 12.5 | 30 days supply | Qty: 60 | Fill #0

## 2016-03-30 MED FILL — ATORVASTATIN 40 MG TABLET: 40 | 30 days supply | Qty: 30 | Fill #0

## 2016-03-30 MED FILL — AMLODIPINE BESYLATE 10 MG T: 10 | 30 days supply | Qty: 30 | Fill #0

## 2016-03-30 NOTE — Progress Notes (Signed)
   CC: diabetes and htn  HPI:  [name] is a 60 y.o. with past medical history as outlined below who presents to clinic for DM and hypertension follow-up. She has not been compliant with all of her meds secondary to cost. She just obtained the [facility] chart however states that she cannot afford the $4 or $10 co-pay. She does not work and lives with her husband and daughter. She was also last seen in clinic for a right fifth toe ulcer that has remained unchanged. She was not able to follow up with wound care clinic secondary to co-pay cost. Please see problem list for further details of her chronic medical conditions.  Past Medical History:  Diagnosis Date  . Arthritis   . CHF (congestive heart failure) ([place])    2014...@ [facility]  . Diabetes mellitus    diagnosed 2001  . Hypertension   . PVD (peripheral vascular disease) ([place])     Review of Systems:  Denies nausea, vomiting, fevers, orthopnea, leg swelling, blood in her stool, diarrhea. She does have constipation and her last bowel movement was yesterday.  Physical Exam:  Vitals:   03/30/16 0938  BP: (!) 150/79  Pulse: 79  Temp: 97.5 F (36.4 C)  TempSrc: Oral  SpO2: 100%  Weight: 167 lb 9.6 oz (76 kg)   Physical Exam  Constitutional: She is oriented to person, place, and time. She appears well-developed and well-nourished. No distress.  HENT:  Head: Normocephalic and atraumatic.  Eyes: EOM are normal.  Cardiovascular: Normal rate, regular rhythm and normal heart sounds.  Exam reveals no gallop and no friction rub.   No murmur heard. Pulmonary/Chest: Effort normal and breath sounds normal. No respiratory distress. She has no wheezes. She has no rales.  Abdominal: Soft. Bowel sounds are normal. She exhibits no distension. There is no tenderness. There is no rebound and no guarding.  Musculoskeletal: She exhibits no edema.  Neurological: She is alert and oriented to person, place, and time.  Skin: Skin is warm and dry. No  rash noted. No erythema. No pallor.    Assessment & Plan:   See Encounters Tab for problem based charting.  Patient discussed with Dr. Angelia Mould

## 2016-03-30 NOTE — Assessment & Plan Note (Signed)
Patient was seen on November 7 for a right fifth toe ulcer that has been present for the past 4 months. Since she was last seen her ulcer has remained stable without any purulent drainage. Tramadol that was prescribed last visit help control pain. On exam ulcer has a clean erythematous base with white patches and no signs of infection. She denies fevers and chills. She does have onychomycosis of her fifth toe.  - Refill tramadol 50 mg twice a day - Educated on better diabetic control for improved wound healing - Advised to make an appointment with wound care clinic now that she has the [facility] card -Advised to wash ulcer daily with Dial soap and apply Neosporin daily.

## 2016-03-30 NOTE — Assessment & Plan Note (Signed)
Will need to get Tdap at next Visit in one month.

## 2016-03-30 NOTE — Progress Notes (Signed)
Diabetes Self-Management Education  Visit Type:  Follow-up  Appt. Start Time: 1100 Appt. End Time: 1275  03/30/2016  [name], identified by name and date of birth, is a 60 y.o. female with a diagnosis of Diabetes:  .Type 2   ASSESSMENT  There were no vitals taken for this visit. There is no height or weight on file to calculate BMI.       Diabetes Self-Management Education - 03/30/16 1300      Health Coping   How would you rate your overall health? Fair     Psychosocial Assessment   Patient Belief/Attitude about Diabetes Motivated to manage diabetes   Self-care barriers Lack of material resources   Self-management support Doctor's office;Family;CDE visits   Patient Concerns Medication;Monitoring   Special Needs None   Preferred Learning Style No preference indicated   Learning Readiness Ready     Pre-Education Assessment   Patient understands the diabetes disease and treatment process. Demonstrates understanding / competency   Patient understands incorporating nutritional management into lifestyle. Needs Review   Patient undertands incorporating physical activity into lifestyle. Demonstrates understanding / competency   Patient understands using medications safely. Needs Instruction   Patient understands monitoring blood glucose, interpreting and using results Demonstrates understanding / competency   Patient understands prevention, detection, and treatment of acute complications. Needs Review   Patient understands prevention, detection, and treatment of chronic complications. Demonstrates understanding / competency   Patient understands how to develop strategies to address psychosocial issues. Demonstrates understanding / competency   Patient understands how to develop strategies to promote health/change behavior. Demonstrates understanding / competency     Complications   Last HgB A1C per patient/outside source 14 %   How often do you check your blood sugar? 0  times/day (not testing)   Number of hypoglycemic episodes per month 0   Number of hyperglycemic episodes per week 100   Can you tell when your blood sugar is high? Yes   What do you do if your blood sugar is high? --  drink water, eat less   Have you had a dilated eye exam in the past 12 months? Yes   Have you had a dental exam in the past 12 months? No   Are you checking your feet? Yes     Patient Education   Medications Taught/reviewed insulin injection, site rotation, insulin storage and needle disposal.;Reviewed patients medication for diabetes, action, purpose, timing of dose and side effects.   Monitoring Taught/discussed recording of test results and interpretation of SMBG.   Acute complications Taught treatment of hypoglycemia - the 15 rule.     Individualized Goals (developed by patient)   Medications take my medication as prescribed     Outcomes   Program Status Not Completed     Subsequent Visit   Since your last visit have you continued or begun to take your medications as prescribed? No   Since your last visit have you had your blood pressure checked? No   Since your last visit have you experienced any weight changes? No change   Since your last visit, are you checking your blood glucose at least once a day? No      Learning Objective:  Patient will have a greater understanding of diabetes self-management. Patient education plan is to attend individual and/or group sessions per assessed needs and concerens  My plan to support myself in continuing these changes to care for my diabetes is to attend or contact:   Diabetes  Support Groups ?Type 1 diabetes support group Type 2 diabetes support group : 2nd Monday of every month from 6-7 PM at [address] [facility] conference room [phone]  Other ? Add other local support resources -doctor's office, CDE, Dietitian, pharmacist, church Journals ? Diabetes Forecast- [phone]-  Shin[employer].fr ? Diabetes Self-Management- [phone]- www.diabetesselfmanagement.com ? Other:   Let your educator know if you need help accessing the websites.  Plan:   Patient Instructions  Please inject 15 units LEVEMIR daily about the same time   Check your blood sugar  every morning to know how the medicine is working, can check your blood sugar  Later in the day but those readings may be affected by what you eat and your activity.   Store the insulin you are NOT using in the refrigerator where it will not freeze.(Do not store the insulin you are using in the refrigerator.)  Throw your vial of  Levemir away after 42 days  It is important to be consistent with where you inject insulin: Inject in the same part of the body most fo the time, inject about 1 " apart within this part of the body.    Throw away sharpes (pen needles, lancets and syringes) in a hard plastic( best if not see through) container with a screw on lid.   Call for questions- Butch Penny [phone]    Expected Outcomes:  Demonstrated interest in learning. Expect positive outcomesGood  Air traffic controller provided: Support group flyer  If problems or questions, patient to contact team via:  Phone  Future DSME appointment: - 4-6 wks4 weeks

## 2016-03-30 NOTE — Assessment & Plan Note (Signed)
Lab Results  Component Value Date   HGBA1C >14.0 03/30/2016   HGBA1C 12.8 06/08/2015   HGBA1C 9.3 (H) 08/19/2013     Assessment: Diabetes control:  uncontrolled Progress toward A1C goal:   not at goal Comments: Patient does not have her glucometer with her. She has been noncompliant with her medications. She has previously been on insulin during her prior pregnancy.  Plan: Medications: Increase metformin from 500 mg to 1000 mg twice a day (cautioned on GI side effects ) and started on Levemir 15 units daily. She was given 1 month supply of syringes. Insulin education was provided by Butch Penny. Other plans: Follow-up in one month

## 2016-03-30 NOTE — Patient Instructions (Signed)
Please inject 15 units LEVEMIR daily about the same time   Check your blood sugar  every morning to know how the medicine is working, can check your blood sugar  Later in the day but those readings may be affected by what you eat and your activity.   Store the insulin you are NOT using in the refrigerator where it will not freeze.(Do not store the insulin you are using in the refrigerator.)  Throw your vial of  Levemir away after 42 days  It is important to be consistent with where you inject insulin: Inject in the same part of the body most fo the time, inject about 1 " apart within this part of the body.    Throw away sharpes (pen needles, lancets and syringes) in a hard plastic( best if not see through) container with a screw on lid.   Call for questions- Butch Penny [phone]

## 2016-03-30 NOTE — Assessment & Plan Note (Signed)
BP Readings from Last 3 Encounters:  03/30/16 (!) 150/79  03/08/16 120/77  07/20/15 (!) 119/55    Lab Results  Component Value Date   NA 139 07/20/2015   K 4.3 07/20/2015   CREATININE 1.39 (H) 07/20/2015    Assessment: Blood pressure control:  uncontrolled Progress toward BP goal:   not goal Comments:Patient has not been taking any of her blood pressure medications. She stopped taking Coreg 25 mg twice a day because it caused her fatigue. She follows with Dr. Terrence Dupont w cardiology. Plan: Medications: Condensed medications to improve affordability. Started on caduet 10-40mg  and decrease Coreg to 12.5 mg twice a day. She brought in her bottle Coreg 25 mg and pharmacy was able to cut all the pills for. Other plans: Follow up in 1 month

## 2016-04-04 ENCOUNTER — Telehealth: Payer: Self-pay

## 2016-04-04 NOTE — Telephone Encounter (Signed)
Could not sleep last night because of foot pain. Wants to pick up all of her medicine today at one time [facility]. She has the money to pick them up and wants to be sure she gets them.   Also, Levemir is helping her sugar gradually decrease. She is not as tired and is feeling better. It is much better  It was 260s last week first thing in the morning and today was 206.

## 2016-04-04 NOTE — Telephone Encounter (Signed)
I have called it to pharm

## 2016-04-04 NOTE — Telephone Encounter (Signed)
Pt states tramadol is not at the pharmacy. Please call pt back.  

## 2016-04-04 NOTE — Telephone Encounter (Signed)
[facility] does not have a new Rx for tramadol. Will ask Triage nurse to call it in and notify patiet when done.

## 2016-04-04 NOTE — Telephone Encounter (Signed)
I placed an order for tramadol to be called in to her pharmacy at her visit. It may not have been called in. Can you please check. Thank you!  Dr. Hulen Luster

## 2016-04-05 NOTE — Progress Notes (Signed)
Internal Medicine Clinic Attending  Case discussed with Dr. Truong soon after the resident saw the patient.  We reviewed the resident's history and exam and pertinent patient test results.  I agree with the assessment, diagnosis, and plan of care documented in the resident's note. 

## 2016-04-13 ENCOUNTER — Other Ambulatory Visit: Payer: Self-pay | Admitting: Dietician

## 2016-04-13 NOTE — Telephone Encounter (Signed)
Called patient about injecting insulin. She says it is going well and her blood sugar is in the 100s and some 200s. She says she needs a refill of metformin, but her chart shows 6 refills.  called [facility] pharmacy- they have no refills- her chart shows 6 refills to be phoned in. Will ask triage nurse to phone them in and have them ready for patient.

## 2016-04-14 ENCOUNTER — Other Ambulatory Visit: Payer: Self-pay | Admitting: Internal Medicine

## 2016-04-14 DIAGNOSIS — E1165 Type 2 diabetes mellitus with hyperglycemia: Principal | ICD-10-CM

## 2016-04-14 DIAGNOSIS — IMO0002 Reserved for concepts with insufficient information to code with codable children: Secondary | ICD-10-CM

## 2016-04-14 DIAGNOSIS — E1151 Type 2 diabetes mellitus with diabetic peripheral angiopathy without gangrene: Secondary | ICD-10-CM

## 2016-04-14 MED FILL — metFORMIN HCL 1000 MG TABS: 1000 | 30 days supply | Qty: 60 | Fill #0

## 2016-04-14 MED FILL — traMADol HCL 50 MG TABS: 50 | 5 days supply | Qty: 10 | Fill #0

## 2016-04-14 NOTE — Telephone Encounter (Signed)
Rx phoned into pharmacy, pt aware.Alyssa Hidden Cassady12/14/20172:28 PM

## 2016-04-14 NOTE — Telephone Encounter (Signed)
Patient is asking about her prescription for metformin.Note a request pending for Dr. Hulen Luster to refill it. Her prescription written 03/30/16 had 6 refills  Will ask triage if they can call that in.

## 2016-04-14 NOTE — Telephone Encounter (Signed)
Metformin 500mg  once daily- was changed to Metformin 1000mg  twice a day by pcp, but pharmacy never received the order.  Rx phoned into pharmacy, pt aware, phone call complete.Alyssa Hidden Cassady12/14/20172:26 PM

## 2016-04-19 ENCOUNTER — Telehealth: Payer: Self-pay | Admitting: Internal Medicine

## 2016-04-19 NOTE — Telephone Encounter (Signed)
APT. REMINDER CALL, LMTCB °

## 2016-04-20 ENCOUNTER — Ambulatory Visit (HOSPITAL_COMMUNITY)
Admission: RE | Admit: 2016-04-20 | Discharge: 2016-04-20 | Disposition: A | Discharge status: Home / Self Care | Payer: Self-pay | Source: Ambulatory Visit | Attending: Internal Medicine | Admitting: Internal Medicine

## 2016-04-20 ENCOUNTER — Encounter: Payer: Self-pay | Admitting: Internal Medicine

## 2016-04-20 ENCOUNTER — Encounter (HOSPITAL_COMMUNITY): Payer: Self-pay

## 2016-04-20 ENCOUNTER — Ambulatory Visit (INDEPENDENT_AMBULATORY_CARE_PROVIDER_SITE_OTHER): Payer: Self-pay | Admitting: Internal Medicine

## 2016-04-20 ENCOUNTER — Encounter (INDEPENDENT_AMBULATORY_CARE_PROVIDER_SITE_OTHER): Payer: Self-pay

## 2016-04-20 ENCOUNTER — Other Ambulatory Visit: Payer: Self-pay | Admitting: Internal Medicine

## 2016-04-20 VITALS — BP 135/68 | HR 80 | Temp 97.8°F | Ht 63.0 in | Wt 169.4 lb

## 2016-04-20 DIAGNOSIS — E1151 Type 2 diabetes mellitus with diabetic peripheral angiopathy without gangrene: Secondary | ICD-10-CM

## 2016-04-20 DIAGNOSIS — L989 Disorder of the skin and subcutaneous tissue, unspecified: Secondary | ICD-10-CM

## 2016-04-20 DIAGNOSIS — Z0000 Encounter for general adult medical examination without abnormal findings: Secondary | ICD-10-CM

## 2016-04-20 DIAGNOSIS — Z794 Long term (current) use of insulin: Secondary | ICD-10-CM

## 2016-04-20 DIAGNOSIS — E1165 Type 2 diabetes mellitus with hyperglycemia: Secondary | ICD-10-CM

## 2016-04-20 DIAGNOSIS — IMO0002 Reserved for concepts with insufficient information to code with codable children: Secondary | ICD-10-CM

## 2016-04-20 DIAGNOSIS — M7989 Other specified soft tissue disorders: Secondary | ICD-10-CM | POA: Insufficient documentation

## 2016-04-20 DIAGNOSIS — Z23 Encounter for immunization: Secondary | ICD-10-CM

## 2016-04-20 MED ORDER — TRAMADOL HCL 50 MG PO TABS: 50.0000 mg | ORAL_TABLET | Freq: Two times a day (BID) | ORAL | 0 refills | Status: DC | PRN

## 2016-04-20 MED ORDER — INSULIN DETEMIR 100 UNIT/ML ~~LOC~~ SOLN: 20.0000 [IU] | Freq: Every day | SUBCUTANEOUS | 11 refills | Status: DC

## 2016-04-20 MED FILL — LEVEMIR 100 UNITS/ML VIAL: 100 | 28 days supply | Qty: 10 | Fill #0

## 2016-04-20 MED FILL — traMADol HCL 50 MG TABS: 50 | 5 days supply | Qty: 10 | Fill #0

## 2016-04-20 NOTE — Progress Notes (Signed)
   CC: rt toe ulcer  HPI:  [name] is a 60 y.o. with past medical history as outlined below who presents to clinic for follow-up of her right toe ulcer.  Past Medical History:  Diagnosis Date  . Arthritis   . CHF (congestive heart failure) ([place])    2014...@ [facility]  . Diabetes mellitus    diagnosed 2001  . Hypertension   . PVD (peripheral vascular disease) ([place])     Review of Systems:  Denies fevers, chills, dizziness.  Physical Exam:  Vitals:   04/20/16 1442  BP: 135/68  Weight: 169 lb 6.4 oz (76.8 kg)   Physical Exam  Constitutional: appears well-developed and well-nourished. No distress.  HENT:  Head: Normocephalic and atraumatic.  Nose: Nose normal.  Cardiovascular: Normal rate, regular rhythm and normal heart sounds.  Exam reveals no gallop and no friction rub.   No murmur heard. Pulmonary/Chest: Effort normal and breath sounds normal. No respiratory distress.  has no wheezes.no rales.  Abdominal: Soft. Bowel sounds are normal.  exhibits no distension. There is no tenderness. There is no rebound and no guarding.  Neurological: alert and oriented to person, place, and time.  Skin: Right fourth toe digit with dark brown/black scab on the medial side that goes above her toenail. Able to express pus when pressing down on the scab. Edges appear clean. Negative for surrounding erythema.   Assessment & Plan:   See Encounters Tab for problem based charting.  Patient seen with Dr. Dareen Piano

## 2016-04-20 NOTE — Patient Instructions (Signed)
Start taking 20 units of Levemir every night.

## 2016-04-21 NOTE — Assessment & Plan Note (Signed)
Assessment: pt's rt 5th toe wound appears to be worsening as she has scab like debris extending over her toe nail and pus beneath it. She is not having any systemic systems. She has been soaking her foot daily and washing it with dial soap and using neosporin. She has not been able to get into wound clinic yet.   Plan: Xray done today negative for osteomyelitis. Allgyn dressing applied to wound, she was instructed to leave it on for one week and then change dressing. She was given enough for 2 weeks. RTC in 2 weeks to re evaluate wound.

## 2016-04-21 NOTE — Assessment & Plan Note (Signed)
Assessment: Patient currently on metformin 1000 mg twice a day and Levemir 15 units. Glucometer report ranges from 154-394.  Plan: Increase Levemir to 20 units daily. Continue metformin 1000 twice a day

## 2016-04-21 NOTE — Progress Notes (Signed)
Internal Medicine Clinic Attending  I saw and evaluated the patient.  I personally confirmed the key portions of the history and exam documented by Dr. Truong and I reviewed pertinent patient test results.  The assessment, diagnosis, and plan were formulated together and I agree with the documentation in the resident's note.  

## 2016-04-21 NOTE — Assessment & Plan Note (Signed)
Given Tdap Vaccination today.

## 2016-04-22 ENCOUNTER — Other Ambulatory Visit: Payer: Self-pay | Admitting: Pharmacist

## 2016-04-22 DIAGNOSIS — I10 Essential (primary) hypertension: Secondary | ICD-10-CM

## 2016-04-22 DIAGNOSIS — E1165 Type 2 diabetes mellitus with hyperglycemia: Secondary | ICD-10-CM

## 2016-04-22 DIAGNOSIS — E1151 Type 2 diabetes mellitus with diabetic peripheral angiopathy without gangrene: Secondary | ICD-10-CM

## 2016-04-22 DIAGNOSIS — IMO0002 Reserved for concepts with insufficient information to code with codable children: Secondary | ICD-10-CM

## 2016-04-22 MED ORDER — METFORMIN HCL 1000 MG PO TABS: 1000.0000 mg | ORAL_TABLET | Freq: Two times a day (BID) | ORAL | 6 refills | Status: DC

## 2016-04-22 MED ORDER — INSULIN DETEMIR 100 UNIT/ML ~~LOC~~ SOLN: 20.0000 [IU] | Freq: Every day | SUBCUTANEOUS | 11 refills | Status: DC

## 2016-04-22 MED ORDER — ASPIRIN EC 81 MG PO TBEC: 81.0000 mg | DELAYED_RELEASE_TABLET | Freq: Every day | ORAL | 11 refills | Status: DC

## 2016-04-22 MED ORDER — AMLODIPINE-ATORVASTATIN 10-40 MG PO TABS: 1.0000 | ORAL_TABLET | Freq: Every day | ORAL | 6 refills | Status: DC

## 2016-04-22 MED ORDER — CARVEDILOL 12.5 MG PO TABS: 12.5000 mg | ORAL_TABLET | Freq: Two times a day (BID) | ORAL | 6 refills | Status: DC

## 2016-04-26 MED FILL — AMLODIPINE BESYLATE 10 MG T: 10 | 30 days supply | Qty: 30 | Fill #0

## 2016-04-26 MED FILL — ATORVASTATIN 40 MG TABLET: 40 | 30 days supply | Qty: 30 | Fill #0

## 2016-04-26 MED FILL — CARVEDILOL 12.5 MG TABLET: 12.5 | 30 days supply | Qty: 60 | Fill #0

## 2016-04-26 MED FILL — metFORMIN HCL 1000 MG TABS: 1000 | 30 days supply | Qty: 60 | Fill #0

## 2016-04-26 MED FILL — LEVEMIR 100 UNITS/ML VIAL: 100 | 50 days supply | Qty: 10 | Fill #0

## 2016-04-27 ENCOUNTER — Other Ambulatory Visit: Payer: Self-pay | Admitting: Internal Medicine

## 2016-04-27 DIAGNOSIS — L989 Disorder of the skin and subcutaneous tissue, unspecified: Secondary | ICD-10-CM

## 2016-05-06 MED FILL — traMADol HCL 50 MG TABS: 50 | 5 days supply | Qty: 10 | Fill #0

## 2016-05-10 MED FILL — TRIAMCINOLONE 0.1% CREAM: 0.1 | 10 days supply | Qty: 15 | Fill #0

## 2016-05-10 MED FILL — DOXYCYCLINE HYCLATE 100 MG: 100 | 14 days supply | Qty: 28 | Fill #0

## 2016-05-17 ENCOUNTER — Telehealth: Payer: Self-pay | Admitting: Internal Medicine

## 2016-05-17 DIAGNOSIS — E1165 Type 2 diabetes mellitus with hyperglycemia: Principal | ICD-10-CM

## 2016-05-17 DIAGNOSIS — E1151 Type 2 diabetes mellitus with diabetic peripheral angiopathy without gangrene: Secondary | ICD-10-CM

## 2016-05-17 DIAGNOSIS — IMO0002 Reserved for concepts with insufficient information to code with codable children: Secondary | ICD-10-CM

## 2016-05-17 NOTE — Telephone Encounter (Signed)
Pls call patient back about where she can get her Strips and Needles from @ a cheap price.  Patient had spoke to Butch Penny about this but, she is out of the office today and needs help.

## 2016-05-20 NOTE — Telephone Encounter (Signed)
Spoke with [name], she just got off the phone with the health department who will provide her with a meter and strips and syringes today. Prescriptions for these were phoned in so she can get them right away.  Request faxed prescriptions

## 2016-05-25 ENCOUNTER — Ambulatory Visit (INDEPENDENT_AMBULATORY_CARE_PROVIDER_SITE_OTHER): Payer: Self-pay | Admitting: Internal Medicine

## 2016-05-25 ENCOUNTER — Encounter: Payer: Self-pay | Admitting: Internal Medicine

## 2016-05-25 VITALS — BP 148/77 | HR 78 | Temp 98.1°F | Wt 170.1 lb

## 2016-05-25 DIAGNOSIS — E1165 Type 2 diabetes mellitus with hyperglycemia: Secondary | ICD-10-CM

## 2016-05-25 DIAGNOSIS — Z79899 Other long term (current) drug therapy: Secondary | ICD-10-CM

## 2016-05-25 DIAGNOSIS — E1151 Type 2 diabetes mellitus with diabetic peripheral angiopathy without gangrene: Secondary | ICD-10-CM

## 2016-05-25 DIAGNOSIS — IMO0002 Reserved for concepts with insufficient information to code with codable children: Secondary | ICD-10-CM

## 2016-05-25 DIAGNOSIS — E11621 Type 2 diabetes mellitus with foot ulcer: Secondary | ICD-10-CM

## 2016-05-25 DIAGNOSIS — L989 Disorder of the skin and subcutaneous tissue, unspecified: Secondary | ICD-10-CM

## 2016-05-25 DIAGNOSIS — I10 Essential (primary) hypertension: Secondary | ICD-10-CM

## 2016-05-25 DIAGNOSIS — L97519 Non-pressure chronic ulcer of other part of right foot with unspecified severity: Secondary | ICD-10-CM

## 2016-05-25 MED ORDER — LISINOPRIL 20 MG PO TABS: 20.0000 mg | ORAL_TABLET | Freq: Every day | ORAL | 11 refills | Status: DC

## 2016-05-25 MED ORDER — "INSULIN SYRINGE-NEEDLE U-100 31G X 15/64"" 0.3 ML MISC": 12 refills | Status: DC

## 2016-05-25 MED ORDER — BLOOD GLUCOSE METER KIT: PACK | 0 refills | Status: DC

## 2016-05-25 MED FILL — LISINOPRIL 20 MG TABLET: 20 | 30 days supply | Qty: 30 | Fill #0

## 2016-05-25 NOTE — Assessment & Plan Note (Signed)
Assessment: Patient did not bring in her glucometer. States CBGs ranging 79- 110's. Last a1c >14 two months ago. States she has been eating healthier.   Plan: f/u 1 month for a1c. Counseled on diet. Instructed to bring glucometer to next visit.

## 2016-05-25 NOTE — Patient Instructions (Signed)
Start taking lisinopril 20mg  once daily for your blood pressure

## 2016-05-25 NOTE — Assessment & Plan Note (Signed)
Assessment: Patient has been seen by Dr. Allyson Sabal with dermatology. She was given a course of antibiotics which helped heel ulcer. She is no longer requiring tramadol for pain.  Plan: Right fifth digit ulcer is healing well without any signs of infection. We'll continue to monitor. She has an appointment with Dr. Allyson Sabal next week.

## 2016-05-25 NOTE — Progress Notes (Signed)
   CC: foot ulcer  HPI:  [name] is a 61 y.o. with past medical history as outlined below who presents to clinic for follow-up of right foot ulcer. Please see problem list for further details.  Past Medical History:  Diagnosis Date  . Arthritis   . CHF (congestive heart failure) ([place])    2014...@ [facility]  . Diabetes mellitus    diagnosed 2001  . Hypertension   . PVD (peripheral vascular disease) ([place])     Review of Systems:  Positive for acid reflux. Negative for foot pain.   Physical Exam:  Vitals:   05/25/16 1352  BP: (!) 148/77  Pulse: 78  Temp: 98.1 F (36.7 C)  TempSrc: Oral  SpO2: 99%  Weight: 170 lb 1.6 oz (77.2 kg)   Physical Exam  Constitutional: oriented to person, place, and time. appears well-developed and well-nourished. No distress.  HENT:  Head: Normocephalic and atraumatic.  Nose: Nose normal.  Cardiovascular: Normal rate, regular rhythm and normal heart sounds.  Exam reveals no gallop and no friction rub.   No murmur heard. Pulmonary/Chest: Effort normal and breath sounds normal. No respiratory distress.  has no wheezes.no rales.  Skin: rt 5th digit with healing ulcer around nail base, no d/c expressed, non tender to palpation, necrotic debris from prior visit is gone, 5th digit toe nail is healthy  Assessment & Plan:   See Encounters Tab for problem based charting.  Patient discussed with Dr. Lynnae January

## 2016-05-25 NOTE — Assessment & Plan Note (Signed)
Assessment: BP today is elevated. She is on norvasc 10mg  and Coreg 12.5 mg twice a day.  Plan: We'll start lisinopril 20 mg daily. Follow-up in one month for BMP.

## 2016-05-28 NOTE — Progress Notes (Signed)
Internal Medicine Clinic Attending  Case discussed with Dr. Truong at the time of the visit.  We reviewed the resident's history and exam and pertinent patient test results.  I agree with the assessment, diagnosis, and plan of care documented in the resident's note.  

## 2016-05-31 MED FILL — DOXYCYCLINE HYCLATE 100 MG: 100 | 14 days supply | Qty: 28 | Fill #0

## 2016-06-09 MED FILL — AMLODIPINE BESYLATE 10 MG T: 10 | 30 days supply | Qty: 30 | Fill #1

## 2016-06-22 ENCOUNTER — Telehealth: Payer: Self-pay | Admitting: Dietician

## 2016-06-22 NOTE — Telephone Encounter (Signed)
[name] says she does not have money to buy insulin syringes. She requests samples to get her by until next month. She hopes to pick them up today. 30 syringes were put at front desk for her.

## 2016-06-29 ENCOUNTER — Encounter: Payer: Self-pay | Admitting: Internal Medicine

## 2016-06-29 ENCOUNTER — Ambulatory Visit (INDEPENDENT_AMBULATORY_CARE_PROVIDER_SITE_OTHER): Payer: Self-pay | Admitting: Internal Medicine

## 2016-06-29 VITALS — BP 160/67 | HR 80 | Temp 98.2°F | Wt 171.7 lb

## 2016-06-29 DIAGNOSIS — IMO0002 Reserved for concepts with insufficient information to code with codable children: Secondary | ICD-10-CM

## 2016-06-29 DIAGNOSIS — Z79899 Other long term (current) drug therapy: Secondary | ICD-10-CM

## 2016-06-29 DIAGNOSIS — E1151 Type 2 diabetes mellitus with diabetic peripheral angiopathy without gangrene: Secondary | ICD-10-CM

## 2016-06-29 DIAGNOSIS — J302 Other seasonal allergic rhinitis: Secondary | ICD-10-CM

## 2016-06-29 DIAGNOSIS — I10 Essential (primary) hypertension: Secondary | ICD-10-CM

## 2016-06-29 DIAGNOSIS — E1165 Type 2 diabetes mellitus with hyperglycemia: Secondary | ICD-10-CM

## 2016-06-29 DIAGNOSIS — Z794 Long term (current) use of insulin: Secondary | ICD-10-CM

## 2016-06-29 LAB — POCT GLYCOSYLATED HEMOGLOBIN (HGB A1C): Hemoglobin A1C: 9

## 2016-06-29 LAB — GLUCOSE, CAPILLARY: Glucose-Capillary: 147 mg/dL — ABNORMAL HIGH (ref 65–99)

## 2016-06-29 MED ORDER — LORATADINE 10 MG PO TABS: 10.0000 mg | ORAL_TABLET | Freq: Every day | ORAL | 2 refills | Status: DC

## 2016-06-29 MED ORDER — LISINOPRIL 40 MG PO TABS: 40.0000 mg | ORAL_TABLET | Freq: Every day | ORAL | 11 refills | Status: DC

## 2016-06-29 MED ORDER — SITAGLIPTIN PHOS-METFORMIN HCL 50-1000 MG PO TABS: 1.0000 | ORAL_TABLET | Freq: Two times a day (BID) | ORAL | 11 refills | Status: DC

## 2016-06-29 MED FILL — LISINOPRIL 40 MG TABLET: 40 | 30 days supply | Qty: 30 | Fill #0

## 2016-06-29 MED FILL — JANUMET 50-1,000 MG TABLET: 50-1000 | 30 days supply | Qty: 60 | Fill #0

## 2016-06-29 NOTE — Progress Notes (Signed)
   CC: DM   HPI:  [name] is a 61 y.o. with PMHx as outlined below who presents to clinic for diabetes follow up. Please see problem list for further details of patient's chronic medical issues.   Past Medical History:  Diagnosis Date  . Arthritis   . CHF (congestive heart failure) ([place])    2014...@ [facility]  . Diabetes mellitus    diagnosed 2001  . Hypertension   . PVD (peripheral vascular disease) ([place])     Review of Systems:  Positive for foot numbness, rhinorrhea, teary eyes, scratchy throat, sneezing, and cough. Denies fevers, chills, night sweats.  Physical Exam:  Vitals:   06/29/16 1431  BP: (!) 160/67  Pulse: 80  Temp: 98.2 F (36.8 C)  TempSrc: Oral  SpO2: 98%  Weight: 171 lb 11.2 oz (77.9 kg)   Physical Exam  Constitutional: appears well-developed and well-nourished. No distress.  HENT:  Head: Normocephalic and atraumatic.  Nose: Nose normal.  Cardiovascular: Normal rate, regular rhythm and normal heart sounds.  Exam reveals no gallop and no friction rub.   No murmur heard. Pulmonary/Chest: Effort normal and breath sounds normal. No respiratory distress.  has no wheezes.no rales.  Abdominal: Soft. Bowel sounds are normal.  exhibits no distension. There is no tenderness. There is no rebound and no guarding.  Neurological: alert and oriented to person, place, and time.  Skin: rt 5th digit healing well, some very minimal/scant light green pus around wound edge easily wiped off and unable to express anymore when pressing down, non TTP, 1+ DP pulses b/l.    Assessment & Plan:   See Encounters Tab for problem based charting.  Patient discussed with Dr. Evette Doffing

## 2016-06-29 NOTE — Patient Instructions (Signed)
Start taking lisinopril 40mg  daily for your blood pressure.   Start taking Janumet 50-1000mg  daily for your diabetes. Once you pick this medication up stop taking the large metformin pill.   Start taking Claritin for allergies.

## 2016-06-30 DIAGNOSIS — J302 Other seasonal allergic rhinitis: Secondary | ICD-10-CM | POA: Insufficient documentation

## 2016-06-30 LAB — BMP8+ANION GAP
ANION GAP: 19 mmol/L — AB (ref 10.0–18.0)
BUN/Creatinine Ratio: 19 (ref 9–23)
BUN: 22 mg/dL (ref 6–24)
CALCIUM: 9.6 mg/dL (ref 8.7–10.2)
CO2: 21 mmol/L (ref 18–29)
CREATININE: 1.18 mg/dL — AB (ref 0.57–1.00)
Chloride: 102 mmol/L (ref 96–106)
GFR calc Af Amer: 58 mL/min/{1.73_m2} — ABNORMAL LOW (ref >59)
GFR, EST NON AFRICAN AMERICAN: 51 mL/min/{1.73_m2} — AB (ref >59)
Glucose: 149 mg/dL — ABNORMAL HIGH (ref 65–99)
Potassium: 4.6 mmol/L (ref 3.5–5.2)
Sodium: 142 mmol/L (ref 134–144)

## 2016-06-30 NOTE — Assessment & Plan Note (Signed)
Assessment: Patient's blood pressure is elevated. She is on Norvasc 10 milligrams, Coreg 12.5 mg twice a day, and lisinopril 20 mg.     plan: Increase lisinopril to 40 mg daily. Checking bmet today was stable. Follow-up in for recheck his metabolic panel again at that visit.

## 2016-06-30 NOTE — Progress Notes (Signed)
Internal Medicine Clinic Attending  Case discussed with Dr. Truong at the time of the visit.  We reviewed the resident's history and exam and pertinent patient test results.  I agree with the assessment, diagnosis, and plan of care documented in the resident's note.  

## 2016-06-30 NOTE — Assessment & Plan Note (Signed)
Assessment: Patient reporting symptoms of seasonal allergies sneezing, rhinorrhea, tearing of her eye, and cough. Denies any fevers night sweats or chills.   Plan: Given Rx for Claritin

## 2016-06-30 NOTE — Assessment & Plan Note (Addendum)
Assessment: Hemoglobin A1c is 9 today. She is on Levemir 20 units daily and metformin 1000 mg twice a day. Her glucometer ranges from 106 257. Her creatinine clearance is 63.  Plan: Start on Januvia 100 mg once daily. Follow-up in 3 months. Foot exam done today.

## 2016-07-06 ENCOUNTER — Telehealth: Payer: Self-pay

## 2016-07-06 MED FILL — AMLODIPINE BESYLATE 10 MG T: 10 | 30 days supply | Qty: 30 | Fill #2

## 2016-07-06 MED FILL — LEVEMIR 100 UNITS/ML VIAL: 100 | 42 days supply | Qty: 10 | Fill #1

## 2016-07-06 MED FILL — CARVEDILOL 12.5 MG TABLET: 12.5 | 30 days supply | Qty: 60 | Fill #1

## 2016-07-06 MED FILL — ATORVASTATIN 40 MG TABLET: 40 | 30 days supply | Qty: 30 | Fill #1

## 2016-07-06 NOTE — Telephone Encounter (Signed)
Patient was told at pharmacy that her insulin should be discarded after 3 weeks. She was calling to verify as she was told by this writer that levemir should be discarded after 42 days after opening. She wanted to know what happened if she took it past the 42 days. We discussed that she may see a rise in her blood sugar but otherwise, injecting insulin that is past its discard date does not usually cause problems. She verbalized understanding and is having her levemir refilled and says he blood sugars have been great.

## 2016-07-06 NOTE — Telephone Encounter (Signed)
Sending to donnap. For advice

## 2016-07-06 NOTE — Telephone Encounter (Signed)
Needs to speak with a nurse about meds. Please call pt back.  

## 2016-07-25 MED FILL — LISINOPRIL 40 MG TABLET: 40 | 30 days supply | Qty: 30 | Fill #1

## 2016-07-25 MED FILL — JANUMET 50-1,000 MG TABLET: 50-1000 | 30 days supply | Qty: 60 | Fill #1

## 2016-07-30 ENCOUNTER — Emergency Department (HOSPITAL_COMMUNITY): Payer: Self-pay

## 2016-07-30 ENCOUNTER — Encounter (HOSPITAL_COMMUNITY): Payer: Self-pay | Admitting: Emergency Medicine

## 2016-07-30 ENCOUNTER — Emergency Department (HOSPITAL_COMMUNITY)
Admission: EM | Admit: 2016-07-30 | Discharge: 2016-07-30 | Disposition: A | Discharge status: Home / Self Care | Payer: Self-pay | Attending: Emergency Medicine | Admitting: Emergency Medicine

## 2016-07-30 DIAGNOSIS — Y92511 Restaurant or cafe as the place of occurrence of the external cause: Secondary | ICD-10-CM | POA: Insufficient documentation

## 2016-07-30 DIAGNOSIS — W108XXA Fall (on) (from) other stairs and steps, initial encounter: Secondary | ICD-10-CM | POA: Insufficient documentation

## 2016-07-30 DIAGNOSIS — Y999 Unspecified external cause status: Secondary | ICD-10-CM | POA: Insufficient documentation

## 2016-07-30 DIAGNOSIS — I5042 Chronic combined systolic (congestive) and diastolic (congestive) heart failure: Secondary | ICD-10-CM | POA: Insufficient documentation

## 2016-07-30 DIAGNOSIS — Z7982 Long term (current) use of aspirin: Secondary | ICD-10-CM | POA: Insufficient documentation

## 2016-07-30 DIAGNOSIS — T148XXA Other injury of unspecified body region, initial encounter: Secondary | ICD-10-CM

## 2016-07-30 DIAGNOSIS — E1122 Type 2 diabetes mellitus with diabetic chronic kidney disease: Secondary | ICD-10-CM | POA: Insufficient documentation

## 2016-07-30 DIAGNOSIS — I130 Hypertensive heart and chronic kidney disease with heart failure and stage 1 through stage 4 chronic kidney disease, or unspecified chronic kidney disease: Secondary | ICD-10-CM | POA: Insufficient documentation

## 2016-07-30 DIAGNOSIS — Z794 Long term (current) use of insulin: Secondary | ICD-10-CM | POA: Insufficient documentation

## 2016-07-30 DIAGNOSIS — W19XXXA Unspecified fall, initial encounter: Secondary | ICD-10-CM

## 2016-07-30 DIAGNOSIS — S39012A Strain of muscle, fascia and tendon of lower back, initial encounter: Secondary | ICD-10-CM | POA: Insufficient documentation

## 2016-07-30 DIAGNOSIS — Y939 Activity, unspecified: Secondary | ICD-10-CM | POA: Insufficient documentation

## 2016-07-30 DIAGNOSIS — Z96642 Presence of left artificial hip joint: Secondary | ICD-10-CM | POA: Insufficient documentation

## 2016-07-30 DIAGNOSIS — E11319 Type 2 diabetes mellitus with unspecified diabetic retinopathy without macular edema: Secondary | ICD-10-CM | POA: Insufficient documentation

## 2016-07-30 DIAGNOSIS — N189 Chronic kidney disease, unspecified: Secondary | ICD-10-CM | POA: Insufficient documentation

## 2016-07-30 MED ORDER — HYDROCODONE-ACETAMINOPHEN 5-325 MG PO TABS
1.0000 | ORAL_TABLET | Freq: Once | ORAL | Status: AC
  Administered 2016-07-30: 1 via ORAL
  Filled 2016-07-30: qty 1

## 2016-07-30 NOTE — ED Provider Notes (Signed)
[facility] DEPT Provider Note   CSN: [id number] Arrival date & time: 07/30/16  1638     History   Chief Complaint Chief Complaint  Patient presents with  . Fall    HPI [name] is a 61 y.o. female.  HPI  61 year old female with history of insulin-dependent DM 2, HTN, CHF, who presents status post mechanical fall that occurred yesterday. Patient states that she was leaving a [place] when she missed the curb and fell, landing on her right side. Endorses hitting her head but denies loss of consciousness. Reports pain in her right hip, that is exacerbated by walking, as well as in her lower back. Denies headache, neck pain, pain in the remainder of her extremities. She is on 81 mg aspirin daily but does not take blood thinners. She has been able to ambulate but with discomfort.  Past Medical History:  Diagnosis Date  . Arthritis   . CHF (congestive heart failure) ([place])    2014...@ [facility]  . Diabetes mellitus    diagnosed 2001  . Hypertension   . PVD (peripheral vascular disease) [facility])     Patient Active Problem List   Diagnosis Date Noted  . Seasonal allergies 06/30/2016  . Skin lesion of foot 03/08/2016  . Financial difficulties 03/08/2016  . Healthcare maintenance 06/16/2015  . Nonproliferative diabetic retinopathy associated with type 2 diabetes mellitus ([place]) 06/15/2015  . Osteoarthritis of right hip 06/09/2015  . Arthritis of left hip 08/18/2013  . DM (diabetes mellitus) type II uncontrolled, periph vascular disorder ([place]) 07/17/2013  . Anemia 07/17/2013  . Chronic combined systolic and diastolic CHF, NYHA class 2 ([place]) 07/07/2013  . PVD (peripheral vascular disease) 11/28/2012  . CKD (chronic kidney disease) 11/22/2012  . HTN (hypertension) 11/08/2012  . Atherosclerosis of native arteries of the extremities with ulceration ([place]) 08/16/2012    Past Surgical History:  Procedure Laterality Date  . ABDOMINAL ANGIOGRAM  12/28/2012  . ABDOMINAL ANGIOGRAM  N/A 12/29/2011   Procedure: ABDOMINAL ANGIOGRAM;  Surgeon: Conrad Waucoma, MD;  Location: [facility] C[facility] LAB;  Service: Cardiovascular;  Laterality: N/A;  . ABDOMINAL AORTAGRAM N/A 12/21/2012   Procedure: ABDOMINAL Maxcine Ham;  Surgeon: Elam Dutch, MD;  Location: [facility] CATH LAB;  Service: Cardiovascular;  Laterality: N/A;  . TOTAL HIP ARTHROPLASTY Left 08/19/2013   Procedure: TOTAL HIP ARTHROPLASTY;  Surgeon: Kerin Salen, MD;  Location: [facility];  Service: Orthopedics;  Laterality: Left;  . TUBAL LIGATION      OB History    No data available       Home Medications    Prior to Admission medications   Medication Sig Start Date End Date Taking? Authorizing Provider  amLODipine (NORVASC) 10 MG tablet Take 10 mg by mouth daily. 07/06/16  Yes Historical Provider, MD  aspirin EC 81 MG tablet Take 1 tablet (81 mg total) by mouth daily. IM program, [facility] 04/22/16  Yes Norman Herrlich, MD  atorvastatin (LIPITOR) 40 MG tablet Take 40 mg by mouth daily. 07/06/16  Yes Historical Provider, MD  BAYER MICROLET LANCETS lancets Check blood sugar three times a day. ICD 10: E11.51 06/16/15  Yes Corky Sox, MD  blood glucose meter kit and supplies Check blood sugar two times a day 05/25/16  Yes Norman Herrlich, MD  Blood Glucose Monitoring Suppl (CONTOUR NEXT EZ MONITOR) w/Device KIT Check blood sugar three times a day. ICD 10: E11.51 06/16/15  Yes Corky Sox, MD  carvedilol (COREG) 12.5 MG tablet Take  1 tablet (12.5 mg total) by mouth 2 (two) times daily with a meal. IM program, [facility] 04/22/16  Yes Denton Brick, MD  Cinnamon 500 MG capsule Take 500 mg by mouth 3 (three) times a week.    Yes Historical Provider, MD  glucose blood (BAYER CONTOUR NEXT TEST) test strip Check blood sugar three times a day. ICD 10: E11.51 06/16/15  Yes Courtney Paris, MD  glucose blood (RELION CONFIRM/MICRO TEST) test strip Please check blood sugar 3 times daily ICD10 code: E11.51 06/15/15  Yes Courtney Paris, MD  insulin detemir (LEVEMIR)  100 UNIT/ML injection Inject 0.2 mLs (20 Units total) into the skin at bedtime. IM program, [facility] 04/22/16  Yes Denton Brick, MD  Insulin Syringe-Needle U-100 31G X 15/64" 0.3 ML MISC Use to take insulin one time a day 05/25/16  Yes Denton Brick, MD  lisinopril (PRINIVIL,ZESTRIL) 40 MG tablet Take 1 tablet (40 mg total) by mouth daily. 06/29/16 06/29/17 Yes Denton Brick, MD  loratadine (CLARITIN) 10 MG tablet Take 1 tablet (10 mg total) by mouth daily. Patient taking differently: Take 10 mg by mouth daily as needed for allergies.  06/29/16 06/29/17 Yes Denton Brick, MD  sitaGLIPtin-metformin (JANUMET) 50-1000 MG tablet Take 1 tablet by mouth 2 (two) times daily with a meal. 06/29/16  Yes Denton Brick, MD  triamcinolone cream (KENALOG) 0.1 % Apply 1 application topically daily as needed for irritation. 05/10/16  Yes Historical Provider, MD  amLODipine-atorvastatin (CADUET) 10-40 MG tablet Take 1 tablet by mouth daily. IM program, [facility] Patient not taking: Reported on 07/30/2016 04/22/16   Denton Brick, MD  metFORMIN (GLUCOPHAGE) 1000 MG tablet Take 1 tablet (1,000 mg total) by mouth 2 (two) times daily with a meal. IM program, [facility] Patient not taking: Reported on 07/30/2016 04/22/16   Denton Brick, MD    Family History Family History  Problem Relation Age of Onset  . Diabetes Mother     Social History Social History  Substance Use Topics  . Smoking status: Never Smoker  . Smokeless tobacco: Never Used  . Alcohol use No     Comment: ocassionally     Allergies   Plavix [clopidogrel bisulfate]   Review of Systems Review of Systems  Constitutional: Negative for chills and fever.  HENT: Negative for facial swelling.   Eyes: Negative for visual disturbance.  Respiratory: Negative for cough and shortness of breath.   Cardiovascular: Negative for chest pain.  Gastrointestinal: Negative for abdominal pain, nausea and vomiting.  Genitourinary: Negative for flank pain.    Musculoskeletal: Positive for back pain. Negative for arthralgias (R hip), gait problem, joint swelling, myalgias, neck pain and neck stiffness.  Skin: Negative for wound.  Neurological: Negative for dizziness, syncope, weakness, light-headedness, numbness and headaches.  Psychiatric/Behavioral: Negative for agitation, behavioral problems and confusion.     Physical Exam Updated Vital Signs BP 137/70   Pulse 80   Temp 98.1 F (36.7 C) (Oral)   Resp (!) 22   Ht 5\' 3"  (1.6 m)   Wt 75.8 kg   SpO2 99%   BMI 29.58 kg/m   Physical Exam  Constitutional: She is oriented to person, place, and time. She appears well-developed and well-nourished. No distress.  HENT:  Head: Normocephalic and atraumatic.  Right Ear: External ear normal.  Left Ear: External ear normal.  Mouth/Throat: Oropharynx is clear and moist.  Eyes: Conjunctivae and EOM are normal. Pupils are equal, round,  and reactive to light.  Neck: Normal range of motion. Neck supple.  Cardiovascular: Normal rate, regular rhythm, normal heart sounds and intact distal pulses.   No murmur heard. Pulmonary/Chest: Effort normal and breath sounds normal. No respiratory distress. She has no wheezes. She has no rales. She exhibits no tenderness.  Abdominal: Soft. Bowel sounds are normal. She exhibits no distension. There is no tenderness. There is no guarding.  Musculoskeletal: She exhibits no edema.  TTP of the midline lower lumbar spine. No midline TTP of the C or T spine. TTP of the R hip, though ROM is intact but with some discomfort. Extremities otherwise atraumatic. Pelvis stable.   Neurological: She is alert and oriented to person, place, and time. She exhibits normal muscle tone.  Face symmetric, tongue midline. 5/5 strength in the proximal and distal upper and lower extremities bilaterally, with intact sensation to light touch.   Skin: Skin is warm and dry.  Psychiatric: She has a normal mood and affect.  Nursing note and vitals  reviewed.    ED Treatments / Results  Labs (all labs ordered are listed, but only abnormal results are displayed) Labs Reviewed - No data to display  EKG  EKG Interpretation None       Radiology Dg Lumbar Spine Complete  Result Date: 07/30/2016 CLINICAL DATA:  Pt states she was coming out of the door down the steps of a restaurant yesterday afternoon when something was on the steps which caused her to slip and fall. Pt states she fell backwards and fell down the steps and injured right side of her body and her back. Pt having right hip pain and lower back pain. Pt was able to successfully invert her feet for pelvis images. EXAM: LUMBAR SPINE - COMPLETE 4+ VIEW COMPARISON:  None. FINDINGS: No fracture. No spondylolisthesis. Mild loss of disc height from the lower thoracic spine through L3-L4. Moderate loss disc height at L4-L5. Small endplate osteophytes noted throughout the visualized spine. The bones are demineralized. There is a partly imaged left hip arthroplasty, well aligned. There are scattered vascular calcifications. Soft tissues are otherwise unremarkable. IMPRESSION: 1. No fracture, spondylolisthesis or acute finding. Electronically Signed   By: Lajean Manes M.D.   On: 07/30/2016 17:41   Dg Pelvis 1-2 Views  Result Date: 07/30/2016 CLINICAL DATA:  [place] backwards down steps. Right hip and low back pain. Initial encounter. EXAM: PELVIS - 1-2 VIEW COMPARISON:  08/19/2013 FINDINGS: A left total hip arthroplasty is again seen and appears normally located on these AP images without evidence of periprosthetic fracture. The right femoral head is also approximated with the acetabulum. Advanced right hip osteoarthrosis is again seen. No acute pelvic fracture or diastasis is identified. Vascular calcifications are noted. IMPRESSION: 1. No acute osseous abnormality identified. 2. Advanced right hip osteoarthrosis.  Prior left hip arthroplasty. Electronically Signed   By: Logan Bores M.D.   On:  07/30/2016 17:41    Procedures Procedures (including critical care time)  Medications Ordered in ED Medications  HYDROcodone-acetaminophen (NORCO/VICODIN) 5-325 MG per tablet 1 tablet (1 tablet Oral Given 07/30/16 1803)     Initial Impression / Assessment and Plan / ED Course  I have reviewed the triage vital signs and the nursing notes.  Pertinent labs & imaging results that were available during my care of the patient were reviewed by me and considered in my medical decision making (see chart for details).     Generally well appearing. Afebrile and hemodynamically stable. Patient endorses hitting  her head but did not lose consciousness. She is now over 24 hours out from her time of injury and has no neurologic deficits, so no indication for CT head at this time. She denies headache.  Patient has tenderness to palpation over the lumbar spine and right hip, but with full range of motion. X-rays of the pelvis and lumbar spine obtained that show significant osteoarthritis of the right hip, but no fractures or malalignment.  Patient is able to bear weight and ambulate. Suspect muscle strain. Patient counseled to perform range of motion exercises, take ibuprofen and Tylenol for pain relief, and apply heating packs for symptomatic relief. Recommend follow-up with her PCP in one week if symptoms have not improved. She was discharged in stable condition.  Care of patient overseen by my attending, Dr. Tomi Bamberger.  Final Clinical Impressions(s) / ED Diagnoses   Final diagnoses:  Fall, initial encounter  Muscle strain    New Prescriptions New Prescriptions   No medications on file     Zipporah Plants, MD 07/30/16 1805    Dorie Rank, MD 08/01/16 [phone]

## 2016-07-30 NOTE — ED Triage Notes (Addendum)
Pt states she was coming out of the door down the steps of a restaurant yesterday afternoon when something was on the steps which caused her to slip and fall. Pt states she fell backwards and fell down the steps and injured right side of her body and her back. Pt reports when she fell back, the bun on her head protected her from hitting her head hard. Pt reports pain in her back and right hip.

## 2016-07-30 NOTE — ED Notes (Signed)
Patient transported to X-ray 

## 2016-08-03 ENCOUNTER — Encounter: Payer: Self-pay | Admitting: Dietician

## 2016-08-03 ENCOUNTER — Telehealth: Payer: Self-pay | Admitting: Dietician

## 2016-08-03 MED FILL — ATORVASTATIN 40 MG TABLET: 40 | 30 days supply | Qty: 30 | Fill #2

## 2016-08-03 MED FILL — AMLODIPINE BESYLATE 10 MG T: 10 | 30 days supply | Qty: 30 | Fill #3

## 2016-08-03 NOTE — Telephone Encounter (Signed)
Alls asking for syringes to take her levemir. She cannot afford them in addition to her other medications. Will leave at least 30 syringes at front desk for her to pick up

## 2016-08-29 MED FILL — LISINOPRIL 40 MG TABLET: 40 | 30 days supply | Qty: 30 | Fill #2

## 2016-09-02 MED FILL — JANUMET 50-1,000 MG TABLET: 50-1000 | 30 days supply | Qty: 60 | Fill #2

## 2016-09-02 MED FILL — ATORVASTATIN 40 MG TABLET: 40 | 30 days supply | Qty: 30 | Fill #3

## 2016-09-07 ENCOUNTER — Encounter: Payer: Self-pay | Admitting: Internal Medicine

## 2016-09-09 ENCOUNTER — Ambulatory Visit: Payer: Self-pay

## 2016-09-09 MED FILL — AMLODIPINE BESYLATE 10 MG T: 10 | 30 days supply | Qty: 30 | Fill #4

## 2016-09-19 MED FILL — CARVEDILOL 12.5 MG TABLET: 12.5 | 30 days supply | Qty: 60 | Fill #2

## 2016-09-29 ENCOUNTER — Ambulatory Visit: Payer: Self-pay

## 2016-09-29 MED FILL — ATORVASTATIN 40 MG TABLET: 40 | 30 days supply | Qty: 30 | Fill #4

## 2016-09-29 MED FILL — LISINOPRIL 40 MG TAB: 40 | 30 days supply | Qty: 30 | Fill #3

## 2016-09-29 MED FILL — LEVEMIR 100 UNITS/ML VIAL: 100 | 42 days supply | Qty: 10 | Fill #2

## 2016-10-11 ENCOUNTER — Encounter: Payer: Self-pay | Admitting: Internal Medicine

## 2016-10-11 ENCOUNTER — Ambulatory Visit (INDEPENDENT_AMBULATORY_CARE_PROVIDER_SITE_OTHER): Payer: Self-pay | Admitting: Internal Medicine

## 2016-10-11 ENCOUNTER — Encounter: Payer: Self-pay | Admitting: *Deleted

## 2016-10-11 VITALS — BP 133/67 | HR 83 | Temp 98.1°F | Wt 173.8 lb

## 2016-10-11 DIAGNOSIS — E1165 Type 2 diabetes mellitus with hyperglycemia: Principal | ICD-10-CM

## 2016-10-11 DIAGNOSIS — IMO0002 Reserved for concepts with insufficient information to code with codable children: Secondary | ICD-10-CM

## 2016-10-11 DIAGNOSIS — I5042 Chronic combined systolic (congestive) and diastolic (congestive) heart failure: Secondary | ICD-10-CM

## 2016-10-11 DIAGNOSIS — R109 Unspecified abdominal pain: Secondary | ICD-10-CM | POA: Insufficient documentation

## 2016-10-11 DIAGNOSIS — Z794 Long term (current) use of insulin: Secondary | ICD-10-CM

## 2016-10-11 DIAGNOSIS — G8929 Other chronic pain: Secondary | ICD-10-CM

## 2016-10-11 DIAGNOSIS — Z96642 Presence of left artificial hip joint: Secondary | ICD-10-CM

## 2016-10-11 DIAGNOSIS — E1151 Type 2 diabetes mellitus with diabetic peripheral angiopathy without gangrene: Secondary | ICD-10-CM

## 2016-10-11 DIAGNOSIS — Z0000 Encounter for general adult medical examination without abnormal findings: Secondary | ICD-10-CM

## 2016-10-11 HISTORY — DX: Unspecified abdominal pain: R10.9

## 2016-10-11 LAB — GLUCOSE, CAPILLARY: Glucose-Capillary: 145 mg/dL — ABNORMAL HIGH (ref 65–99)

## 2016-10-11 LAB — POCT GLYCOSYLATED HEMOGLOBIN (HGB A1C): Hemoglobin A1C: 7.1

## 2016-10-11 NOTE — Assessment & Plan Note (Signed)
Due for hep c and HIV but prefers to wait until her next BMET to do this. Referral for mammogram placed. She states she had an eye exam this year already.

## 2016-10-11 NOTE — Assessment & Plan Note (Signed)
Pt has chronic left flank pain. She does not have associated dysuria or fever. She had a left hip replacement in 2015 and a left sided limp. She occasionally uses a cane. On exam she does not have any spinal tenderness and her left leg is shorter than her right.   - referral for PT and podiatry for foot insoles.

## 2016-10-11 NOTE — Assessment & Plan Note (Signed)
A1c 7.1 today. She is on levemir 20 units and janumet BID. She checks her CBGs daily and only has a few reading from this month in the mid 100's to low 200's. She reports around 4 hypoglycemic episodes but none of them were recent this month or last.   - continue current DM regimen - referral placed for podiatry for nail trimmings - f/u in 1 month

## 2016-10-11 NOTE — Assessment & Plan Note (Signed)
Pt has hx of CHF w/ last ECHO on file EF of 30-35%. She previously paid out of pocket to see Dr. Terrence Dupont. She is asymptomatic and euvolemic today. Will refer to cardiology for ischemic workup.

## 2016-10-11 NOTE — Progress Notes (Signed)
   CC: DM  HPI:  [name] is a 61 y.o. with PMHx as outlined below who presents to clinic for diabetes follow up. Please see problem list for further details of patient's chronic medical issues.   Past Medical History:  Diagnosis Date  . Arthritis   . CHF (congestive heart failure) ([place])    2014...@ [facility]  . Diabetes mellitus    diagnosed 2001  . Hypertension   . PVD (peripheral vascular disease) ([place])     Review of Systems:  + for left flank pain that is chronic, denies dysuria, abd pain, fevers, and saddle anesthesia.   Physical Exam:  Vitals:   10/11/16 1527  BP: 133/67  Pulse: 83  Temp: 98.1 F (36.7 C)  TempSrc: Oral  SpO2: 100%  Weight: 173 lb 12.8 oz (78.8 kg)   General: well nourished, no acute distress Lungs: CTAB, no wheeizng Cardiac: RRR, no murmurs/r/g GI: abd non TTP, active bowel sounds MSK: TTP of left flank region, left sided limp, left LE shorter than right  Assessment & Plan:   See Encounters Tab for problem based charting.  Patient discussed with Dr. Lynnae January

## 2016-10-14 NOTE — Progress Notes (Signed)
Internal Medicine Clinic Attending  Case discussed with Dr. Truong at the time of the visit.  We reviewed the resident's history and exam and pertinent patient test results.  I agree with the assessment, diagnosis, and plan of care documented in the resident's note.  

## 2016-10-17 MED FILL — AMLODIPINE BESYLATE 10 MG T: 10 | 30 days supply | Qty: 30 | Fill #5

## 2016-10-19 ENCOUNTER — Encounter: Payer: Self-pay | Admitting: *Deleted

## 2016-10-20 ENCOUNTER — Other Ambulatory Visit: Payer: Self-pay | Admitting: Pharmacist

## 2016-10-20 DIAGNOSIS — E1165 Type 2 diabetes mellitus with hyperglycemia: Principal | ICD-10-CM

## 2016-10-20 DIAGNOSIS — E1151 Type 2 diabetes mellitus with diabetic peripheral angiopathy without gangrene: Secondary | ICD-10-CM

## 2016-10-20 DIAGNOSIS — IMO0002 Reserved for concepts with insufficient information to code with codable children: Secondary | ICD-10-CM

## 2016-10-20 MED ORDER — SITAGLIPTIN PHOS-METFORMIN HCL 50-1000 MG PO TABS: 1.0000 | ORAL_TABLET | Freq: Two times a day (BID) | ORAL | 11 refills | Status: DC

## 2016-10-20 MED FILL — JANUMET 50-1,000 MG TABLET: 50-1000 | 30 days supply | Qty: 60 | Fill #0

## 2016-10-20 NOTE — Progress Notes (Signed)
Re-sent Janumet through IM program.

## 2016-10-24 ENCOUNTER — Ambulatory Visit: Payer: Self-pay | Attending: Podiatry | Admitting: Podiatry

## 2016-10-24 DIAGNOSIS — B359 Dermatophytosis, unspecified: Secondary | ICD-10-CM

## 2016-10-24 DIAGNOSIS — M79674 Pain in right toe(s): Secondary | ICD-10-CM

## 2016-10-24 DIAGNOSIS — E119 Type 2 diabetes mellitus without complications: Secondary | ICD-10-CM

## 2016-10-24 DIAGNOSIS — M79675 Pain in left toe(s): Secondary | ICD-10-CM

## 2016-10-26 ENCOUNTER — Encounter: Payer: Self-pay | Admitting: Physical Therapy

## 2016-10-26 ENCOUNTER — Ambulatory Visit: Payer: No Typology Code available for payment source | Attending: Internal Medicine | Admitting: Physical Therapy

## 2016-10-26 DIAGNOSIS — M545 Low back pain, unspecified: Secondary | ICD-10-CM

## 2016-10-26 DIAGNOSIS — M25552 Pain in left hip: Secondary | ICD-10-CM | POA: Insufficient documentation

## 2016-10-26 DIAGNOSIS — G8929 Other chronic pain: Secondary | ICD-10-CM | POA: Insufficient documentation

## 2016-10-26 DIAGNOSIS — M25551 Pain in right hip: Secondary | ICD-10-CM

## 2016-10-26 DIAGNOSIS — R262 Difficulty in walking, not elsewhere classified: Secondary | ICD-10-CM | POA: Insufficient documentation

## 2016-10-26 DIAGNOSIS — M25651 Stiffness of right hip, not elsewhere classified: Secondary | ICD-10-CM

## 2016-10-26 MED FILL — CARVEDILOL 12.5 MG TABLET: 12.5 | 30 days supply | Qty: 60 | Fill #3

## 2016-10-27 NOTE — Progress Notes (Signed)
Subjective:    Patient ID: [name], female   DOB: 61 y.o.   MRN: [id number]   **Seen at the [facility]**  HPI 61 year old female presents the office today for concerns of thick, painful, elongated tenderness that she cannot trim herself and they're getting irritated with shoes. She is in the nails are curling in. She denies any recent injury or trauma to her feet denies any open sores other small scrape on the right fourth toe which she put Neosporin on and this helped. She denies a numbness or tingling. She has no other complaints.   Review of Systems  All other systems reviewed and are negative.       Objective:  Physical Exam General: AAO x3, NAD  Dermatological: Nails are hypertrophic, dystrophic, brittle, discolored, elongated 10. No surrounding redness or drainage. Tenderness nails 1-5 bilaterally.Small superficial abrasion to the dorsal aspect of the right fourth toe which is almost completely healed. There is no edema, erythema, drainage or pus any clinical signs of infection this area. No open lesions or pre-ulcerative lesions are identified today.  Vascular: Dorsalis Pedis artery and Posterior Tibial artery pedal pulses are 1/4 bilateral. There is no pain with calf compression, swelling, warmth, erythema.   Neruologic: Grossly intact via light touch bilateral.  Protective threshold with Semmes Wienstein monofilament intact to all pedal sites bilateral.   Musculoskeletal: Hammertoes are present. Muscular strength 5/5 in all groups tested bilateral.     Assessment:     61 year old female with symptomatic onychomycosis    Plan:     -Treatment options discussed including all alternatives, risks, and complications -Etiology of symptoms were discussed -Nails debrided 10 without complications or bleeding. -Daily foot inspection -Continue small amount of interbody ointment to the right fourth toe daily. Nonhealed next 2 weeks to call the  office. Monitor for any signs or symptoms of infection. -Follow-up in 3 months or sooner if any problems arise. In the meantime, encouraged to call the office with any questions, concerns, change in symptoms.   Celesta Gentile, DPM

## 2016-10-27 NOTE — Therapy (Signed)
[place], [place], [address] Phone: [phone]   Fax:  [phone]  Physical Therapy Evaluation  Patient Details  Name: [name] MRN: [id number] Date of Birth: 1956/01/07 Referring Provider: Dr Georga Kaufmann   Encounter Date: 10/26/2016      PT End of Session - 10/27/16 1116    Visit Number 1   Number of Visits 16   Date for PT Re-Evaluation 12/22/16   Authorization Type CAFA    PT Start Time 1415   PT Stop Time 1500   PT Time Calculation (min) 45 min   Activity Tolerance Patient tolerated treatment well   Behavior During Therapy [facility] for tasks assessed/performed      Past Medical History:  Diagnosis Date  . Arthritis   . CHF (congestive heart failure) ([place])    2014...@ [facility]  . Diabetes mellitus    diagnosed 2001  . Hypertension   . PVD (peripheral vascular disease) ([place])     Past Surgical History:  Procedure Laterality Date  . ABDOMINAL ANGIOGRAM  12/28/2012  . ABDOMINAL ANGIOGRAM N/A 12/29/2011   Procedure: ABDOMINAL ANGIOGRAM;  Surgeon: Conrad Waurika, MD;  [facility] C[facility] LAB;  Service: Cardiovascular;  Laterality: N/A;  . ABDOMINAL AORTAGRAM N/A 12/21/2012   Procedure: ABDOMINAL Maxcine Ham;  Surgeon: Elam Dutch, MD;  Location: [facility] CATH LAB;  Service: Cardiovascular;  Laterality: N/A;  . TOTAL HIP ARTHROPLASTY Left 08/19/2013   Procedure: TOTAL HIP ARTHROPLASTY;  Surgeon: Kerin Salen, MD;  Location: [place];  Service: Orthopedics;  Laterality: Left;  . TUBAL LIGATION      There were no vitals filed for this visit.       Subjective Assessment - 10/26/16 1431    Currently in Pain? Yes   Pain Score 6    Pain Location Hip   Pain Orientation Right   Pain Descriptors / Indicators Aching   Pain Type Acute pain   Pain Radiating Towards into the buttcock    Pain Onset More than a month ago   Pain Frequency Constant   Aggravating Factors  standing and walking    Pain Relieving  Factors rest, standing and walkin g   Effect of Pain on Daily Activities difficulty perfroming ADL's             [facility] PT Assessment - 10/27/16 0001      Assessment   Medical Diagnosis left hip and back pain    Referring Provider Dr Georga Kaufmann    Onset Date/Surgical Date --  imncreased over the past few years    Hand Dominance Right   Next MD Visit Nothing Scheduled    Prior Therapy Had rehab for her left hip      Precautions   Precautions Fall   Precaution Comments Patient feels like at times she has to hold the walls. She has a cane but preffers not to use it      Restrictions   Weight Bearing Restrictions No   Other Position/Activity Restrictions N     Balance Screen   Has the patient fallen in the past 6 months No   Has the patient had a decrease in activity level because of a fear of falling?  No   Is the patient reluctant to leave their home because of a fear of falling?  No     Home Environment   Living Environment Private residence   Living Arrangements Spouse/significant other   Additional Comments No steps  into the house     Prior Function   Level of Independence Independent   Vocation Unemployed   Leisure walking      Cognition   Overall Cognitive Status Within Functional Limits for tasks assessed   Attention Focused   Focused Attention Appears intact   Memory Appears intact   Awareness Appears intact   Problem Solving Appears intact     Observation/Other Assessments   Focus on Therapeutic Outcomes (FOTO)  Not given      Sensation   Light Touch Appears Intact     Coordination   Gross Motor Movements are Fluid and Coordinated Yes   Fine Motor Movements are Fluid and Coordinated Yes     Posture/Postural Control   Posture Comments Right hip elevation;      AROM   Overall AROM Comments significant pain with active right hip flexion and left     PROM   Left Hip Extension 80 degrees but pain at end range      Strength   Strength Assessment  Site Hip;Knee   Right/Left Hip Right;Left   Right Hip Flexion 3/5   Right Hip ABduction 3/5   Right Hip ADduction 3+/5   Left Hip Flexion 3+/5   Left Hip ABduction 4/5   Left Hip ADduction 4/5     Right Hip   Right Hip Flexion 50  degrees with pain      Palpation   Palpation comment tenderness to palpation in the right and left hip      Transfers   Comments needs to use hands to stand up      Ambulation/Gait   Gait Comments abduction of the right hi. Decreased single leg stance and stability on the right. Right hip trendelenburg; Decreased right hip flexion            Objective measurements completed on examination: See above findings.          [place] Adult PT Treatment/Exercise - 10/27/16 0001      Exercises   Exercises Knee/Hip     Lumbar Exercises: Stretches   Single Knee to Chest Stretch Limitations with a towel 3x15 seconds      Knee/Hip Exercises: Seated   Long Arc Quad Limitations 2x10      Knee/Hip Exercises: Supine   Quad Sets Limitations 2x10      Manual Therapy   Manual therapy comments gentle LAD on the right side                PT Education - 10/27/16 1116    Education provided Yes   Education Details improtance of improving movements and strength, HEP    Person(s) Educated Patient   Methods Explanation;Demonstration;Verbal cues;Tactile cues;Handout   Comprehension Verbalized understanding;Returned demonstration;Verbal cues required;Tactile cues required;Need further instruction          PT Short Term Goals - 10/27/16 1129      PT SHORT TERM GOAL #1   Title Patient will demsotrate a good core contraction    Time 4   Period Weeks   Status New     PT SHORT TERM GOAL #2   Title Patient will increase right and left hip flexion by 20 degrees bilateral   Time 4   Period Weeks   Status New     PT SHORT TERM GOAL #3   Title Patient will demsotrate 4/4 gross bilateral lower extremity strength    Time 4   Period Weeks    Status New  PT SHORT TERM GOAL #4   Title Patient will be independent with initial HEP    Time 4   Period Weeks   Status New           PT Long Term Goals - 10/27/16 1130      PT LONG TERM GOAL #1   Title Patient will transfer sit to stand without hands and without pain    Time 8   Period Weeks   Status New     PT LONG TERM GOAL #2   Title Patient will ambualte 3000' with LRAD without pain    Time 8   Period Weeks   Status New     PT LONG TERM GOAL #3   Title Patient will stand for 30 minutes without pain    Time 8   Period Weeks   Status New                Plan - 10/27/16 1119    Clinical Impression Statement Patient is a 61 year old female with Left sided hip pain and low back pain. She needs a hip replacement on the right side but can not get it. She presents with right and left leg weakness. She has signiifcant limitations in right LE single leg stance stability. She also had a functional leg length discrepency with the right being significantly shorter then the left. Therapy put a lift in her shoe. She had improved gait pattern using the left and a cane on the left side. She was encouraged to do so at home. She would benefit from skilled therapy to improve the mobility and stability of both legs. Caustion will be taken secondary to degneration of the right hip. She was seen for a moderate complexity evlaluation.    History and Personal Factors relevant to plan of care: CHF, diabetes, left hip replacement; needs right hip replacement    Clinical Presentation Evolving   Clinical Presentation due to: increasing pain on the left; evolving instability on the right    Clinical Decision Making Moderate   Rehab Potential Good   PT Frequency 2x / week   PT Duration 8 weeks   PT Treatment/Interventions ADLs/Self Care Home Management;Gait training;Stair training;Iontophoresis 4mg /ml Dexamethasone;Electrical Stimulation;Cryotherapy;Moist Heat;Ultrasound;Therapeutic  activities;Therapeutic exercise;Patient/family education;Neuromuscular re-education;Splinting;Taping;Manual techniques;Passive range of motion   PT Next Visit Plan assess tolerance to heel lift; PROM of both hips; soft tissue mobilization of the left side of the back; add light clamshells; add balkl squeeze; work on abdominal bracing;    PT Home Exercise Plan single knee to chest with towel; lateral trunk rotation; quad sets, glut sets; LAQ    Consulted and Agree with Plan of Care Patient      Patient will benefit from skilled therapeutic intervention in order to improve the following deficits and impairments:  Abnormal gait, Decreased range of motion, Difficulty walking, Increased muscle spasms, Decreased endurance, Decreased activity tolerance, Pain, Decreased strength, Postural dysfunction  Visit Diagnosis: Pain in left hip - Plan: PT plan of care cert/re-cert  Chronic left-sided low back pain without sciatica - Plan: PT plan of care cert/re-cert  Pain in right hip - Plan: PT plan of care cert/re-cert  Stiffness of right hip, not elsewhere classified - Plan: PT plan of care cert/re-cert  Difficulty in walking, not elsewhere classified - Plan: PT plan of care cert/re-cert     Problem List Patient Active Problem List   Diagnosis Date Noted  . Left flank pain 10/11/2016  . Seasonal allergies 06/30/2016  .  Skin lesion of foot 03/08/2016  . Financial difficulties 03/08/2016  . Healthcare maintenance 06/16/2015  . Nonproliferative diabetic retinopathy associated with type 2 diabetes mellitus ([place]) 06/15/2015  . Osteoarthritis of right hip 06/09/2015  . Arthritis of left hip 08/18/2013  . DM (diabetes mellitus) type II uncontrolled, periph vascular disorder ([place]) 07/17/2013  . Anemia 07/17/2013  . Chronic combined systolic and diastolic CHF, NYHA class 2 ([place]) 07/07/2013  . PVD (peripheral vascular disease) 11/28/2012  . CKD (chronic kidney disease) 11/22/2012  . HTN (hypertension)  11/08/2012  . Atherosclerosis of native arteries of the extremities with ulceration ([place]) 08/16/2012    Carney Living PT DPT  10/27/2016, 11:36 AM  [facility] [address] [place], [place], [address] Phone: [phone]   Fax:  [phone]  Name: [name] MRN: [id number] Date of Birth: Oct 17, 1955

## 2016-11-01 ENCOUNTER — Ambulatory Visit: Payer: No Typology Code available for payment source | Attending: Internal Medicine | Admitting: Physical Therapy

## 2016-11-01 ENCOUNTER — Encounter: Payer: Self-pay | Admitting: Physical Therapy

## 2016-11-01 DIAGNOSIS — M545 Low back pain, unspecified: Secondary | ICD-10-CM

## 2016-11-01 DIAGNOSIS — M25552 Pain in left hip: Secondary | ICD-10-CM | POA: Insufficient documentation

## 2016-11-01 DIAGNOSIS — M25651 Stiffness of right hip, not elsewhere classified: Secondary | ICD-10-CM | POA: Insufficient documentation

## 2016-11-01 DIAGNOSIS — M25551 Pain in right hip: Secondary | ICD-10-CM | POA: Insufficient documentation

## 2016-11-01 DIAGNOSIS — R262 Difficulty in walking, not elsewhere classified: Secondary | ICD-10-CM | POA: Insufficient documentation

## 2016-11-01 DIAGNOSIS — G8929 Other chronic pain: Secondary | ICD-10-CM | POA: Insufficient documentation

## 2016-11-01 NOTE — Patient Instructions (Signed)
Issued from exercise drawer Pelvic tilt 5-10 x 1 second to 5 second holds Clams with tilt 5-10 X Pause vs holding Keep breathing

## 2016-11-01 NOTE — Therapy (Signed)
[place], [place], [address] Phone: [phone]   Fax:  [phone]  Physical Therapy Treatment  Patient Details  Name: [name] MRN: [id number] Date of Birth: [birth year] Referring Provider: Dr Georga Kaufmann   Encounter Date: 11/01/2016      PT End of Session - 11/01/16 0937    Visit Number 2   Number of Visits 16   Date for PT Re-Evaluation 12/22/16   PT Start Time 0848   PT Stop Time 0930   PT Time Calculation (min) 42 min   Activity Tolerance Treatment limited secondary to medical complications (Comment)   Behavior During Therapy [facility] for tasks assessed/performed      Past Medical History:  Diagnosis Date  . Arthritis   . CHF (congestive heart failure) ([place])    2014...@ [facility]  . Diabetes mellitus    diagnosed 2001  . Hypertension   . PVD (peripheral vascular disease) ([place])     Past Surgical History:  Procedure Laterality Date  . ABDOMINAL ANGIOGRAM  12/28/2012  . ABDOMINAL ANGIOGRAM N/A 12/29/2011   Procedure: ABDOMINAL ANGIOGRAM;  Surgeon: Conrad [place], MD;  Location: [facility] CATH LAB;  Service: Cardiovascular;  Laterality: N/A;  . ABDOMINAL AORTAGRAM N/A 12/21/2012   Procedure: ABDOMINAL Maxcine Ham;  Surgeon: Elam Dutch, MD;  Location: [facility] CATH LAB;  Service: Cardiovascular;  Laterality: N/A;  . TOTAL HIP ARTHROPLASTY Left 08/19/2013   Procedure: TOTAL HIP ARTHROPLASTY;  Surgeon: Kerin Salen, MD;  Location: [place];  Service: Orthopedics;  Laterality: Left;  . TUBAL LIGATION      There were no vitals filed for this visit.      Subjective Assessment - 11/01/16 0851    Subjective Heel lift helped decrease pain and is more steady.   Currently in Pain? Yes   Pain Score 6    Pain Location Hip   Pain Orientation Right;Left   Pain Descriptors / Indicators Numbness;Aching   Pain Type Acute pain   Pain Radiating Towards into buttocks   Pain Frequency Constant   Aggravating Factors   walking standing , sitting or laying in one position too long   Pain Relieving Factors changing position, heel lift,  tries rubs but they do not get deep enough.   Effect of Pain on Daily Activities difficulty with ADL's, limited walking                         [facility] Adult PT Treatment/Exercise - 11/01/16 0001      Ambulation/Gait   Gait Comments knee buckeled with gait X1 at end of session,  she says it used to happen more often.      Self-Care   Self-Care --  sleeping with pillows between knees. Log roll with pillow     Lumbar Exercises: Stretches   Pelvic Tilt 5 reps   Pelvic Tilt Limitations HEP, good contraction abdominals     Lumbar Exercises: Supine   Glut Set 5 reps   Clam 10 reps   Clam Limitations 1 set with red band,  HEO no bands   Other Supine Lumbar Exercises right leg lengthener 5 X     Knee/Hip Exercises: Supine   Heel Slides 10 reps   Heel Slides Limitations limited ROM   Other Supine Knee/Hip Exercises clams with and without band 10 x each,  band uncomfortable  HEP     Knee/Hip Exercises: Sidelying   Clams 10 X  with  pillow between knees     Manual Therapy   Manual Therapy --  PROMright hip in sidelying, gentle   Manual therapy comments illiopsoas released both.,  tension relaxed in back, taught patient how to do                PT Education - 11/01/16 0918    Education provided Yes   Education Details HEP   Person(s) Educated Patient   Methods Explanation;Tactile cues;Verbal cues;Handout   Comprehension Verbalized understanding;Returned demonstration          PT Short Term Goals - 11/01/16 0942      PT SHORT TERM GOAL #1   Title Patient will demsotrate a good core contraction    Baseline nice pelvic tilt   Time 4   Period Weeks   Status On-going     PT SHORT TERM GOAL #2   Title Patient will increase right and left hip flexion by 20 degrees bilateral   Baseline not yet improved    Time 4   Period Weeks    Status On-going     PT SHORT TERM GOAL #3   Title Patient will demsotrate 4/4 gross bilateral lower extremity strength    Time 4   Period Weeks   Status Unable to assess     PT SHORT TERM GOAL #4   Title Patient will be independent with initial HEP    Baseline independent with exercises so far   Time 4   Period Weeks   Status On-going           PT Long Term Goals - 10/27/16 1130      PT LONG TERM GOAL #1   Title Patient will transfer sit to stand without hands and without pain    Time 8   Period Weeks   Status New     PT LONG TERM GOAL #2   Title Patient will ambualte 3000' with LRAD without pain    Time 8   Period Weeks   Status New     PT LONG TERM GOAL #3   Title Patient will stand for 30 minutes without pain    Time 8   Period Weeks   Status New               Plan - 11/01/16 0937    Clinical Impression Statement Heel lift helpful.  Less pain with walking post session.  Right knee buckeled X1 after session,  Patient able to recover independently. No pain in supine post session.  care takent to avoid right hip pain increase during session.   PT Treatment/Interventions ADLs/Self Care Home Management;Gait training;Stair training;Iontophoresis 4mg /ml Dexamethasone;Electrical Stimulation;Cryotherapy;Moist Heat;Ultrasound;Therapeutic activities;Therapeutic exercise;Patient/family education;Neuromuscular re-education;Splinting;Taping;Manual techniques;Passive range of motion   PT Next Visit Plan ; PROM of both hips; soft tissue mobilization of the left side of the back; add light clamshells; add balkl squeeze; work on abdominal bracing;    PT Home Exercise Plan single knee to chest with towel; lateral trunk rotation; quad sets, glut sets; LAQ clams, tilt   Consulted and Agree with Plan of Care Patient      Patient will benefit from skilled therapeutic intervention in order to improve the following deficits and impairments:  Abnormal gait, Decreased range of  motion, Difficulty walking, Increased muscle spasms, Decreased endurance, Decreased activity tolerance, Pain, Decreased strength, Postural dysfunction  Visit Diagnosis: Pain in left hip  Chronic left-sided low back pain without sciatica  Pain in right hip  Stiffness of right hip,  not elsewhere classified  Difficulty in walking, not elsewhere classified     Problem List Patient Active Problem List   Diagnosis Date Noted  . Left flank pain 10/11/2016  . Seasonal allergies 06/30/2016  . Skin lesion of foot 03/08/2016  . Financial difficulties 03/08/2016  . Healthcare maintenance 06/16/2015  . Nonproliferative diabetic retinopathy associated with type 2 diabetes mellitus ([place]) 06/15/2015  . Osteoarthritis of right hip 06/09/2015  . Arthritis of left hip 08/18/2013  . DM (diabetes mellitus) type II uncontrolled, periph vascular disorder ([place]) 07/17/2013  . Anemia 07/17/2013  . Chronic combined systolic and diastolic CHF, NYHA class 2 ([place]) 07/07/2013  . PVD (peripheral vascular disease) 11/28/2012  . CKD (chronic kidney disease) 11/22/2012  . HTN (hypertension) 11/08/2012  . Atherosclerosis of native arteries of the extremities with ulceration ([place]) 08/16/2012    HARRIS,KAREN PTA 11/01/2016, 9:45 AM  [place], [place], [address] Phone: [phone]   Fax:  [phone]  Name: JANALEE GROBE MRN: [id number] Date of Birth: 1955/10/07

## 2016-11-07 MED FILL — LISINOPRIL 40 MG TAB: 40 | 30 days supply | Qty: 30 | Fill #4

## 2016-11-10 ENCOUNTER — Ambulatory Visit (INDEPENDENT_AMBULATORY_CARE_PROVIDER_SITE_OTHER): Payer: No Typology Code available for payment source | Admitting: Interventional Cardiology

## 2016-11-10 ENCOUNTER — Ambulatory Visit: Payer: No Typology Code available for payment source | Admitting: Physical Therapy

## 2016-11-10 ENCOUNTER — Encounter: Payer: Self-pay | Admitting: Interventional Cardiology

## 2016-11-10 VITALS — BP 126/72 | HR 82 | Ht 63.0 in | Wt 172.2 lb

## 2016-11-10 DIAGNOSIS — M25651 Stiffness of right hip, not elsewhere classified: Secondary | ICD-10-CM

## 2016-11-10 DIAGNOSIS — I10 Essential (primary) hypertension: Secondary | ICD-10-CM

## 2016-11-10 DIAGNOSIS — R262 Difficulty in walking, not elsewhere classified: Secondary | ICD-10-CM

## 2016-11-10 DIAGNOSIS — M545 Low back pain, unspecified: Secondary | ICD-10-CM

## 2016-11-10 DIAGNOSIS — M25552 Pain in left hip: Secondary | ICD-10-CM

## 2016-11-10 DIAGNOSIS — I739 Peripheral vascular disease, unspecified: Secondary | ICD-10-CM

## 2016-11-10 DIAGNOSIS — I5042 Chronic combined systolic (congestive) and diastolic (congestive) heart failure: Secondary | ICD-10-CM

## 2016-11-10 DIAGNOSIS — I779 Disorder of arteries and arterioles, unspecified: Secondary | ICD-10-CM

## 2016-11-10 DIAGNOSIS — M25551 Pain in right hip: Secondary | ICD-10-CM

## 2016-11-10 DIAGNOSIS — G8929 Other chronic pain: Secondary | ICD-10-CM

## 2016-11-10 DIAGNOSIS — I7025 Atherosclerosis of native arteries of other extremities with ulceration: Secondary | ICD-10-CM

## 2016-11-10 NOTE — Progress Notes (Signed)
Cardiology Office Note    Date:  11/10/2016   ID:  [name], DOB [birth year], MRN [id number]  PCP:  Melanee Spry, MD  Cardiologist: Sinclair Grooms, MD   Chief Complaint  Patient presents with  . Congestive Heart Failure    History of Present Illness:  [name] is a 61 y.o. female former patient of Dr. Terrence Dupont and referred by Dr. Julious Oka for ischemic evaluation in the setting of chronic combined systolic and diastolic heart failure and diabetes mellitus.  The patient most to have longitudinally care for her already diagnosed cardiac condition. She has a history of chronic combined systolic and diastolic heart failure. She has been previously cared for by Dr. Terrence Dupont. She has peripheral arterial disease with occlusion of left lower extremity and right lower extremity vessels with reconstitution. She has been seen by [facility]. Also known to have less than 60% stenosis in both carotids.  She denies orthopnea, PND, and exertional chest pain. She is not able to walk very for our with much pace because of leg pain that she attributes to arthritis in her hips. She has not had palpitations or syncope.  Greater than   Past Medical History:  Diagnosis Date  . Arthritis   . CHF (congestive heart failure) ([place])    2014...@ [facility]  . Diabetes mellitus    diagnosed 2001  . Hypertension   . PVD (peripheral vascular disease) ([place])     Past Surgical History:  Procedure Laterality Date  . ABDOMINAL ANGIOGRAM  12/28/2012  . ABDOMINAL ANGIOGRAM N/A 12/29/2011   Procedure: ABDOMINAL ANGIOGRAM;  Surgeon: Conrad Poweshiek, MD;  Location: [facility] CATH LAB;  Service: Cardiovascular;  Laterality: N/A;  . ABDOMINAL AORTAGRAM N/A 12/21/2012   Procedure: ABDOMINAL Maxcine Ham;  Surgeon: Elam Dutch, MD;  Location: [facility] CATH LAB;  Service: Cardiovascular;  Laterality: N/A;  . TOTAL HIP ARTHROPLASTY Left 08/19/2013   Procedure: TOTAL HIP ARTHROPLASTY;  Surgeon: Kerin Salen, MD;  Location: [facility];  Service: Orthopedics;  Laterality: Left;  . TUBAL LIGATION      Current Medications: Outpatient Medications Prior to Visit  Medication Sig Dispense Refill  . amLODipine (NORVASC) 10 MG tablet Take 10 mg by mouth daily.  6  . aspirin EC 81 MG tablet Take 1 tablet (81 mg total) by mouth daily. IM program, hope fund 30 tablet 11  . atorvastatin (LIPITOR) 40 MG tablet Take 40 mg by mouth daily.  6  . BAYER MICROLET LANCETS lancets Check blood sugar three times a day. ICD 10: E11.51 100 each 12  . blood glucose meter kit and supplies Check blood sugar two times a day 1 each 0  . Blood Glucose Monitoring Suppl (CONTOUR NEXT EZ MONITOR) w/Device KIT Check blood sugar three times a day. ICD 10: E11.51 1 kit 0  . carvedilol (COREG) 12.5 MG tablet Take 1 tablet (12.5 mg total) by mouth 2 (two) times daily with a meal. IM program, hope fund 60 tablet 6  . glucose blood (BAYER CONTOUR NEXT TEST) test strip Check blood sugar three times a day. ICD 10: E11.51 100 each 12  . insulin detemir (LEVEMIR) 100 UNIT/ML injection Inject 0.2 mLs (20 Units total) into the skin at bedtime. IM program, hope fund 10 mL 11  . Insulin Syringe-Needle U-100 31G X 15/64" 0.3 ML MISC Use to take insulin one time a day 100 each 12  . lisinopril (PRINIVIL,ZESTRIL) 40 MG tablet Take 1 tablet (40  mg total) by mouth daily. 30 tablet 11  . loratadine (CLARITIN) 10 MG tablet Take 1 tablet (10 mg total) by mouth daily. (Patient taking differently: Take 10 mg by mouth daily as needed for allergies. ) 30 tablet 2  . sitaGLIPtin-metformin (JANUMET) 50-1000 MG tablet Take 1 tablet by mouth 2 (two) times daily with a meal. IM program (Patient taking differently: Take 2 tablets by mouth 2 (two) times daily with a meal. IM program) 60 tablet 11  . triamcinolone cream (KENALOG) 0.1 % Apply 1 application topically daily as needed for irritation.  1  . Cinnamon 500 MG capsule Take 500 mg by mouth 3 (three) times a week.     Marland Kitchen glucose blood  (RELION CONFIRM/MICRO TEST) test strip Please check blood sugar 3 times daily ICD10 code: E11.51 (Patient not taking: Reported on 11/10/2016) 100 each 12   No facility-administered medications prior to visit.      Allergies:   Plavix [clopidogrel bisulfate]   Social History   Social History  . Marital status: Married    Spouse name: N/A  . Number of children: N/A  . Years of education: 12,college   Social History Main Topics  . Smoking status: Never Smoker  . Smokeless tobacco: Never Used  . Alcohol use No     Comment: ocassionally  . Drug use: No  . Sexual activity: Yes   Other Topics Concern  . None   Social History Narrative  . None     Family History:  The patient's family history includes Diabetes in her mother.   ROS:   Please see the history of present illness.    She complains of cough, abdominal pain, back pain, left and right hip arthritis, recent chills, excessive fatigue, constipation, anxiety, difficulty walking, and balance issues.  All other systems reviewed and are negative.   PHYSICAL EXAM:   VS:  BP 126/72 (BP Location: Right Arm)   Pulse 82   Ht _0  (1.6 m)   Wt 172 lb 3.2 oz (78.1 kg)   BMI 30.50 kg/m    GEN: Well nourished, well developed, in no acute distress  HEENT: normal  Neck: no JVD, carotid bruits, or masses Cardiac: RRR; no murmurs, rubs, or gallops,no edema  Respiratory:  clear to auscultation bilaterally, normal work of breathing GI: soft, nontender, nondistended, + BS MS: no deformity or atrophy  Skin: warm and dry, no rash Neuro:  Alert and Oriented x 3, Strength and sensation are intact Psych: euthymic mood, full affect  Wt Readings from Last 3 Encounters:  11/10/16 172 lb 3.2 oz (78.1 kg)  10/11/16 173 lb 12.8 oz (78.8 kg)  07/30/16 167 lb (75.8 kg)      Studies/Labs Reviewed:   EKG:  EKG  Normal sinus rhythm with left ventricular hypertrophy and strain. Poor wave progression. When compared to prior tracings, no  significant change has occurred.  Recent Labs: 06/29/2016: BUN 22; Creatinine, Ser 1.18; Potassium 4.6; Sodium 142   Lipid Panel    Component Value Date/Time   CHOL 168 11/08/2012 1331   TRIG 95 11/08/2012 1331   HDL 45 11/08/2012 1331   CHOLHDL 3.7 11/08/2012 1331   VLDL 19 11/08/2012 1331   LDLCALC 104 (H) 11/08/2012 1331    Additional studies/ records that were reviewed today include:  Echocardiogram 06/2013:  Study Conclusions  - Left ventricle: The cavity size was normal. Wall thickness was increased in a pattern of moderate LVH. Systolic function was moderately to severely reduced.  The estimated ejection fraction was in the range of 30% to 35%. Diffuse hypokinesis. Moderate hypokinesis. Features are consistent with a pseudonormal left ventricular filling pattern, with concomitant abnormal relaxation and increased filling pressure (grade 2 diastolic dysfunction). - Aortic valve: Mild to moderate regurgitation. - Mitral valve: Moderately calcified annulus. Normal thickness leaflets . Mild regurgitation. - Left atrium: The atrium was mildly to moderately dilated. - Right atrium: The atrium was mildly dilated. - Tricuspid valve: Moderate regurgitation. - Pulmonary arteries: Systolic pressure was mildly to moderately increased. PA peak pressure: 42m Hg (S).  ASSESSMENT:    1. Chronic combined systolic and diastolic CHF, NYHA class 2 ([place]   2. Essential hypertension   3. Atherosclerosis of native arteries of the extremities with ulceration ([place]   4. Bilateral carotid artery disease (HCC)      PLAN:  In order of problems listed above:  1. 2-D Doppler echocardiogram will be done to reassess LV function and we will perform a Lexiscan Myoview to exclude CAD as a cause for the patient's systolic dysfunction. 2. Excellent control. 3. I recommended walking to help improve collateral flow. The claudication is followed by [facility]. 4. 3 years ago less than  60% bilateral carotid obstruction was noted. Will repeat imaging.  No change in current medical regimen is recommended. We will obtain the above studies and have the patient back in 4-6 weeks to discuss long-term management.    Medication Adjustments/Labs and Tests Ordered: Current medicines are reviewed at length with the patient today.  Concerns regarding medicines are outlined above.  Medication changes, Labs and Tests ordered today are listed in the Patient Instructions below. There are no Patient Instructions on file for this visit.   Signed, HSinclair Grooms MD  11/10/2016 4:01 PM    [facility] [address] [place]   [id number]: [phone] Fax: [phone]

## 2016-11-10 NOTE — Patient Instructions (Signed)
Medication Instructions:  None  Labwork: None  Testing/Procedures: Your physician has requested that you have an echocardiogram. Echocardiography is a painless test that uses sound waves to create images of your heart. It provides your doctor with information about the size and shape of your heart and how well your heart's chambers and valves are working. This procedure takes approximately one hour. There are no restrictions for this procedure.  Your physician has requested that you have a lexiscan myoview. For further information please visit [email or web address]. Please follow instruction sheet, as given.  Your physician has requested that you have a carotid duplex. This test is an ultrasound of the carotid arteries in your neck. It looks at blood flow through these arteries that supply the brain with blood. Allow one hour for this exam. There are no restrictions or special instructions.   Follow-Up: Your physician recommends that you schedule a follow-up appointment in: 4-6 weeks with Dr. Tamala Julian.    Any Other Special Instructions Will Be Listed Below (If Applicable).     If you need a refill on your cardiac medications before your next appointment, please call your pharmacy.

## 2016-11-10 NOTE — Therapy (Signed)
[place], [place], [address] Phone: [phone]   Fax:  [phone]  Physical Therapy Treatment  Patient Details  Name: [name] MRN: [id number] Date of Birth: [birth year] Referring Provider: Dr Georga Kaufmann   Encounter Date: 11/10/2016      PT End of Session - 11/10/16 0937    Visit Number 3   Number of Visits 16   Date for PT Re-Evaluation 12/22/16   Authorization Type CAFA    PT Start Time 0930   PT Stop Time 1015   PT Time Calculation (min) 45 min      Past Medical History:  Diagnosis Date  . Arthritis   . CHF (congestive heart failure) ([place])    2014...@ [facility]  . Diabetes mellitus    diagnosed 2001  . Hypertension   . PVD (peripheral vascular disease) ([place])     Past Surgical History:  Procedure Laterality Date  . ABDOMINAL ANGIOGRAM  12/28/2012  . ABDOMINAL ANGIOGRAM N/A 12/29/2011   Procedure: ABDOMINAL ANGIOGRAM;  Surgeon: Conrad Royston, MD;  Location: [facility] CATH LAB;  Service: Cardiovascular;  Laterality: N/A;  . ABDOMINAL AORTAGRAM N/A 12/21/2012   Procedure: ABDOMINAL Maxcine Ham;  Surgeon: Elam Dutch, MD;  Location: [facility] CATH LAB;  Service: Cardiovascular;  Laterality: N/A;  . TOTAL HIP ARTHROPLASTY Left 08/19/2013   Procedure: TOTAL HIP ARTHROPLASTY;  Surgeon: Kerin Salen, MD;  Location: [facility];  Service: Orthopedics;  Laterality: Left;  . TUBAL LIGATION      There were no vitals filed for this visit.      Subjective Assessment - 11/10/16 0936    Subjective I want to walk better    Currently in Pain? Yes   Pain Score 6    Pain Location Hip   Pain Orientation Right;Left   Pain Descriptors / Indicators Aching   Aggravating Factors  prolonged on feet or positions   Pain Relieving Factors change positions, rest after being on feet             [facility] PT Assessment - 11/10/16 0001      Right Hip   Right Hip Flexion 78  left 90                       [facility] Adult PT Treatment/Exercise - 11/10/16 0001      Lumbar Exercises: Stretches   Single Knee to Chest Stretch Limitations with a towel 3x15 seconds      Lumbar Exercises: Supine   Ab Set 10 reps   AB Set Limitations Transverse abdominal training.    Glut Set 10 reps   Glut Set Limitations hook lying into pelvic tilt    Clam 20 reps   Clam Limitations red band, abdominal draw in    Bent Knee Raise 20 reps   Bent Knee Raise Limitations with abdominal draw in   Bridge Limitations glute set with mini bridge x 10    Other Supine Lumbar Exercises ball squueze with abdominal draw in x 10      Knee/Hip Exercises: Stretches   Other Knee/Hip Stretches W stretch for IR, Butterfly stretch for ER 3 x 20 sec each      Knee/Hip Exercises: Seated   Long Arc Quad Limitations 2x10      Knee/Hip Exercises: Supine   Quad Sets Limitations 10 reps each    Short Arc [employer] 20 reps;Both   Straight Leg Raises 10 reps   Straight  Leg Raises Limitations left only      Manual Therapy   Manual therapy comments PROM right hip flexion, Ir, Er                 PT Education - 11/10/16 1007    Education provided Yes   Education Details HEP   Person(s) Educated Patient   Methods Explanation;Handout   Comprehension Verbalized understanding          PT Short Term Goals - 11/10/16 0945      PT SHORT TERM GOAL #1   Title Patient will demsotrate a good core contraction    Baseline began Tra training today   Time 4   Period Weeks   Status On-going     PT SHORT TERM GOAL #2   Title Patient will increase right and left hip flexion by 20 degrees bilateral   Baseline improved from 50 to 78 supine   Time 4   Period Weeks   Status Achieved     PT SHORT TERM GOAL #3   Title Patient will demsotrate 4/5 gross bilateral lower extremity strength    Time 4   Period Weeks   Status Unable to assess     PT SHORT TERM GOAL #4   Title Patient will be independent with initial HEP    Baseline  independent with exercises so far   Time 4   Period Weeks   Status Achieved           PT Long Term Goals - 10/27/16 1130      PT LONG TERM GOAL #1   Title Patient will transfer sit to stand without hands and without pain    Time 8   Period Weeks   Status New     PT LONG TERM GOAL #2   Title Patient will ambualte 3000' with LRAD without pain    Time 8   Period Weeks   Status New     PT LONG TERM GOAL #3   Title Patient will stand for 30 minutes without pain    Time 8   Period Weeks   Status New               Plan - 11/10/16 1194    Clinical Impression Statement Independent with intial HEP. Hip flexion improved. STG #2, #4 met. Began Transverse abdominal training with good contraction. Updated HEP to include core. Pt felt looser at end of session. Still needs to steady herself to prevent right knee buckling before attempting to walk after therex.    PT Next Visit Plan ; PROM of both hips; soft tissue mobilization of the left side of the back; add light clamshells; add balkl squeeze; work on abdominal bracing;    PT Home Exercise Plan single knee to chest with towel; lateral trunk rotation; quad sets, glut sets; LAQ clams, tilt, Transverse abdominal contraction with clams and bent knee raises.    Consulted and Agree with Plan of Care Patient      Patient will benefit from skilled therapeutic intervention in order to improve the following deficits and impairments:     Visit Diagnosis: Pain in left hip  Chronic left-sided low back pain without sciatica  Pain in right hip  Stiffness of right hip, not elsewhere classified  Difficulty in walking, not elsewhere classified     Problem List Patient Active Problem List   Diagnosis Date Noted  . Left flank pain 10/11/2016  . Seasonal allergies 06/30/2016  . Skin  lesion of foot 03/08/2016  . Financial difficulties 03/08/2016  . Healthcare maintenance 06/16/2015  . Nonproliferative diabetic retinopathy  associated with type 2 diabetes mellitus ([place]) 06/15/2015  . Osteoarthritis of right hip 06/09/2015  . Arthritis of left hip 08/18/2013  . DM (diabetes mellitus) type II uncontrolled, periph vascular disorder ([place]) 07/17/2013  . Anemia 07/17/2013  . Chronic combined systolic and diastolic CHF, NYHA class 2 ([place]) 07/07/2013  . PVD (peripheral vascular disease) 11/28/2012  . CKD (chronic kidney disease) 11/22/2012  . HTN (hypertension) 11/08/2012  . Atherosclerosis of native arteries of the extremities with ulceration ([place]) 08/16/2012    Dorene Ar, PTA 11/10/2016, 10:28 AM  [place], [place], [address] Phone: [phone]   Fax:  [phone]  Name: [name] MRN: [id number] Date of Birth: [birth year]

## 2016-11-14 MED FILL — ATORVASTATIN 40 MG TABLET: 40 | 30 days supply | Qty: 30 | Fill #5

## 2016-11-15 ENCOUNTER — Other Ambulatory Visit: Payer: Self-pay | Admitting: Internal Medicine

## 2016-11-15 ENCOUNTER — Ambulatory Visit (HOSPITAL_COMMUNITY)
Admission: RE | Admit: 2016-11-15 | Discharge: 2016-11-15 | Disposition: A | Discharge status: Home / Self Care | Payer: No Typology Code available for payment source | Source: Ambulatory Visit | Attending: Cardiovascular Disease | Admitting: Cardiovascular Disease

## 2016-11-15 DIAGNOSIS — I739 Peripheral vascular disease, unspecified: Secondary | ICD-10-CM

## 2016-11-15 DIAGNOSIS — Z1231 Encounter for screening mammogram for malignant neoplasm of breast: Secondary | ICD-10-CM

## 2016-11-15 DIAGNOSIS — I6523 Occlusion and stenosis of bilateral carotid arteries: Secondary | ICD-10-CM

## 2016-11-15 DIAGNOSIS — I779 Disorder of arteries and arterioles, unspecified: Secondary | ICD-10-CM | POA: Insufficient documentation

## 2016-11-16 ENCOUNTER — Ambulatory Visit: Payer: No Typology Code available for payment source | Admitting: Physical Therapy

## 2016-11-16 DIAGNOSIS — M25551 Pain in right hip: Secondary | ICD-10-CM

## 2016-11-16 DIAGNOSIS — M25651 Stiffness of right hip, not elsewhere classified: Secondary | ICD-10-CM

## 2016-11-16 DIAGNOSIS — M25552 Pain in left hip: Secondary | ICD-10-CM

## 2016-11-16 DIAGNOSIS — M545 Low back pain, unspecified: Secondary | ICD-10-CM

## 2016-11-16 DIAGNOSIS — G8929 Other chronic pain: Secondary | ICD-10-CM

## 2016-11-16 MED FILL — AMLODIPINE BESYLATE 10 MG T: 10 | 30 days supply | Qty: 30 | Fill #6

## 2016-11-16 NOTE — Therapy (Signed)
[place], [place], [address] Phone: [phone]   Fax:  [phone]  Physical Therapy Treatment  Patient Details  Name: [name] MRN: [id number] Date of Birth: [birth year] Referring Provider: Dr Georga Kaufmann   Encounter Date: 11/16/2016      PT End of Session - 11/16/16 1028    Visit Number 4   Number of Visits 16   Date for PT Re-Evaluation 12/22/16   Authorization Type CAFA    PT Start Time 1016   PT Stop Time 1059   PT Time Calculation (min) 43 min   Activity Tolerance Patient tolerated treatment well   Behavior During Therapy [facility] for tasks assessed/performed      Past Medical History:  Diagnosis Date  . Arthritis   . CHF (congestive heart failure) ([place])    2014...@ [facility]  . Diabetes mellitus    diagnosed 2001  . Hypertension   . PVD (peripheral vascular disease) ([place])     Past Surgical History:  Procedure Laterality Date  . ABDOMINAL ANGIOGRAM  12/28/2012  . ABDOMINAL ANGIOGRAM N/A 12/29/2011   Procedure: ABDOMINAL ANGIOGRAM;  Surgeon: Conrad [place], MD;  Location: [facility] CATH LAB;  Service: Cardiovascular;  Laterality: N/A;  . ABDOMINAL AORTAGRAM N/A 12/21/2012   Procedure: ABDOMINAL Maxcine Ham;  Surgeon: Elam Dutch, MD;  Location: [facility] CATH LAB;  Service: Cardiovascular;  Laterality: N/A;  . TOTAL HIP ARTHROPLASTY Left 08/19/2013   Procedure: TOTAL HIP ARTHROPLASTY;  Surgeon: Kerin Salen, MD;  Location: [facility];  Service: Orthopedics;  Laterality: Left;  . TUBAL LIGATION      There were no vitals filed for this visit.      Subjective Assessment - 11/16/16 1021    Subjective Patient feels like it is getting a little better. She really feels a deifference when she does not wear the heel lift. She contiues to use a cane.    Pertinent History Left total hip replacement, DMII, CHF,    Limitations Standing;Walking;Lifting   Currently in Pain? Yes   Pain Score 6    Pain Location Hip    Pain Descriptors / Indicators Aching   Pain Type Acute pain   Pain Onset More than a month ago   Pain Frequency Constant   Aggravating Factors  standing, walking, movement of the hip    Pain Relieving Factors changing positions   Effect of Pain on Daily Activities Difficulty with ADL's                                  PT Education - 11/16/16 1023    Education provided Yes   Education Details continue with HEP    Person(s) Educated Patient   Methods Explanation;Handout   Comprehension Verbalized understanding          PT Short Term Goals - 11/10/16 0945      PT SHORT TERM GOAL #1   Title Patient will demsotrate a good core contraction    Baseline began Tra training today   Time 4   Period Weeks   Status On-going     PT SHORT TERM GOAL #2   Title Patient will increase right and left hip flexion by 20 degrees bilateral   Baseline improved from 50 to 78 supine   Time 4   Period Weeks   Status Achieved     PT SHORT TERM GOAL #3   Title  Patient will demsotrate 4/5 gross bilateral lower extremity strength    Time 4   Period Weeks   Status Unable to assess     PT SHORT TERM GOAL #4   Title Patient will be independent with initial HEP    Baseline independent with exercises so far   Time 4   Period Weeks   Status Achieved           PT Long Term Goals - 10/27/16 1130      PT LONG TERM GOAL #1   Title Patient will transfer sit to stand without hands and without pain    Time 8   Period Weeks   Status New     PT LONG TERM GOAL #2   Title Patient will ambualte 3000' with LRAD without pain    Time 8   Period Weeks   Status New     PT LONG TERM GOAL #3   Title Patient will stand for 30 minutes without pain    Time 8   Period Weeks   Status New               Plan - 11/16/16 2022    History and Personal Factors relevant to plan of care: CHF diabetes, left hip replacement, needs right hip replacement    Clinical  Presentation Evolving   Clinical Presentation due to: increasing pain on the left; eveloving pain on the right    Clinical Decision Making Moderate   Rehab Potential Good   PT Frequency 2x / week   PT Duration 8 weeks   PT Treatment/Interventions ADLs/Self Care Home Management;Gait training;Stair training;Iontophoresis 4mg /ml Dexamethasone;Electrical Stimulation;Cryotherapy;Moist Heat;Ultrasound;Therapeutic activities;Therapeutic exercise;Patient/family education;Neuromuscular re-education;Splinting;Taping;Manual techniques;Passive range of motion   PT Next Visit Plan Continue to work on exercises to stregthen bilateral lower extremitys. Continue to work on hip mobility.     PT Home Exercise Plan single knee to chest with towel; lateral trunk rotation; quad sets, glut sets; LAQ clams, tilt, Transverse abdominal contraction with clams and bent knee raises.    Consulted and Agree with Plan of Care Patient      Patient will benefit from skilled therapeutic intervention in order to improve the following deficits and impairments:  Abnormal gait, Decreased range of motion, Difficulty walking, Increased muscle spasms, Decreased endurance, Decreased activity tolerance, Pain, Decreased strength, Postural dysfunction  Visit Diagnosis: Pain in right hip  Chronic left-sided low back pain without sciatica  Pain in left hip  Stiffness of right hip, not elsewhere classified     Problem List Patient Active Problem List   Diagnosis Date Noted  . Bilateral carotid artery disease ([place]) 11/10/2016  . Left flank pain 10/11/2016  . Seasonal allergies 06/30/2016  . Skin lesion of foot 03/08/2016  . Financial difficulties 03/08/2016  . Healthcare maintenance 06/16/2015  . Nonproliferative diabetic retinopathy associated with type 2 diabetes mellitus ([place]) 06/15/2015  . Osteoarthritis of right hip 06/09/2015  . Arthritis of left hip 08/18/2013  . DM (diabetes mellitus) type II uncontrolled, periph  vascular disorder ([place]) 07/17/2013  . Anemia 07/17/2013  . Chronic combined systolic and diastolic CHF, NYHA class 2 ([place]) 07/07/2013  . PVD (peripheral vascular disease) 11/28/2012  . CKD (chronic kidney disease) 11/22/2012  . HTN (hypertension) 11/08/2012  . Atherosclerosis of native arteries of the extremities with ulceration ([place]) 08/16/2012    Carney Living PT DPT  11/16/2016, 8:24 PM  [facility] [address] [place], [place], [address] Phone: [phone]  Fax:  [phone]  Name: [name] MRN: [id number] Date of Birth: Nov 12, 1955

## 2016-11-22 ENCOUNTER — Telehealth (HOSPITAL_COMMUNITY): Payer: Self-pay | Admitting: *Deleted

## 2016-11-22 NOTE — Telephone Encounter (Signed)
Patient given detailed instructions per Myocardial Perfusion Study Information Sheet for the test on 11/23/16. Patient notified to arrive 15 minutes early and that it is imperative to arrive on time for appointment to keep from having the test rescheduled.  If you need to cancel or reschedule your appointment, please call the office within 24 hours of your appointment. . Patient verbalized understanding. Kirstie Peri

## 2016-11-23 ENCOUNTER — Telehealth: Payer: Self-pay | Admitting: Interventional Cardiology

## 2016-11-23 ENCOUNTER — Ambulatory Visit: Payer: No Typology Code available for payment source | Admitting: Physical Therapy

## 2016-11-23 ENCOUNTER — Encounter: Payer: Self-pay | Admitting: Physical Therapy

## 2016-11-23 DIAGNOSIS — M545 Low back pain, unspecified: Secondary | ICD-10-CM

## 2016-11-23 DIAGNOSIS — M25651 Stiffness of right hip, not elsewhere classified: Secondary | ICD-10-CM

## 2016-11-23 DIAGNOSIS — M25552 Pain in left hip: Secondary | ICD-10-CM

## 2016-11-23 DIAGNOSIS — M25551 Pain in right hip: Secondary | ICD-10-CM

## 2016-11-23 DIAGNOSIS — G8929 Other chronic pain: Secondary | ICD-10-CM

## 2016-11-23 NOTE — Therapy (Signed)
[place], [place], [address] Phone: [phone]   Fax:  [phone]  Physical Therapy Treatment  Patient Details  Name: [name] MRN: [id number] Date of Birth: Feb 26, 1956 Referring Provider: Dr Georga Kaufmann   Encounter Date: 11/23/2016      PT End of Session - 11/23/16 1045    Visit Number 5   Number of Visits 16   Date for PT Re-Evaluation 12/22/16   Authorization Type CAFA    PT Start Time 1015   PT Stop Time 1111   PT Time Calculation (min) 56 min   Activity Tolerance Patient tolerated treatment well   Behavior During Therapy [facility] for tasks assessed/performed      Past Medical History:  Diagnosis Date  . Arthritis   . CHF (congestive heart failure) ([place])    2014...@ [facility]  . Diabetes mellitus    diagnosed 2001  . Hypertension   . PVD (peripheral vascular disease) ([place])     Past Surgical History:  Procedure Laterality Date  . ABDOMINAL ANGIOGRAM  12/28/2012  . ABDOMINAL ANGIOGRAM N/A 12/29/2011   Procedure: ABDOMINAL ANGIOGRAM;  Surgeon: Conrad Penuelas, MD;  [facility];  Service: Cardiovascular;  Laterality: N/A;  . ABDOMINAL AORTAGRAM N/A 12/21/2012   Procedure: ABDOMINAL Maxcine Ham;  Surgeon: Elam Dutch, MD;  Location: [facility] CATH LAB;  Service: Cardiovascular;  Laterality: N/A;  . TOTAL HIP ARTHROPLASTY Left 08/19/2013   Procedure: TOTAL HIP ARTHROPLASTY;  Surgeon: Kerin Salen, MD;  Location: [place];  Service: Orthopedics;  Laterality: Left;  . TUBAL LIGATION      There were no vitals filed for this visit.      Subjective Assessment - 11/23/16 1023    Subjective Patient had been holding her grandson the last few days and it has made her hip sore. She feels like the lift in her she may be breaking down but she has only had it for a few weeks. Therapy will assess.    Pertinent History Left total hip replacement, DMII, CHF,    Limitations Standing;Walking;Lifting    Currently in Pain? Yes   Pain Location Hip   Pain Orientation Right   Pain Descriptors / Indicators Aching   Pain Radiating Towards raidaiting form anterior hip to the lateral hip    Pain Onset More than a month ago   Pain Frequency Constant   Aggravating Factors  standing, ealking, movement of the hip    Pain Relieving Factors changing positions    Effect of Pain on Daily Activities difficutly with ADL;s                          [facility] Adult PT Treatment/Exercise - 11/23/16 0001      Lumbar Exercises: Stretches   Single Knee to Chest Stretch Limitations with a towel 3x15 seconds tried prior to manual therapy and the patient had pain; tried after with improved range and decreased pain. Patient advised to try to keep new range at home.    Pelvic Tilt Limitations 10 x 5 sec hold good technique noted.      Lumbar Exercises: Supine   Ab Set 10 reps   AB Set Limitations Transverse abdominal training.    Glut Set 10 reps   Glut Set Limitations hook lying into pelvic tilt    Clam 20 reps   Clam Limitations red band, abdominal draw in    Bent Knee Raise 20 reps  Bent Knee Raise Limitations with abdominal draw in   Bridge Limitations glute set with mini bridge x 10    Other Supine Lumbar Exercises ball squueze with abdominal draw in x 10    Other Supine Lumbar Exercises right leg lengthener 5 X     Manual Therapy   Manual therapy comments PROM right hip flexion, Ir, Er; posterior hip mobilization and  LAD to improve knee mobility.                 PT Education - 11/23/16 1039    Education provided Yes   Education Details symptom mangement, improtance of strengthening   Person(s) Educated Patient   Methods Explanation;Handout   Comprehension Verbalized understanding          PT Short Term Goals - 11/23/16 1311      PT SHORT TERM GOAL #1   Title Patient will demsotrate a good core contraction    Baseline excellent posteriro pelvic tilt demonstrated  today    Time 4   Period Weeks   Status On-going     PT SHORT TERM GOAL #2   Title Patient will increase right and left hip flexion by 20 degrees bilateral   Baseline improved from 50 to 78 supine   Time 4   Period Weeks   Status On-going     PT SHORT TERM GOAL #3   Title Patient will demsotrate 4/5 gross bilateral lower extremity strength    Time 4   Period Weeks   Status On-going     PT SHORT TERM GOAL #4   Title Patient will be independent with initial HEP    Baseline independent with exercises so far   Time 4   Period Weeks   Status On-going           PT Long Term Goals - 10/27/16 1130      PT LONG TERM GOAL #1   Title Patient will transfer sit to stand without hands and without pain    Time 8   Period Weeks   Status New     PT LONG TERM GOAL #2   Title Patient will ambualte 3000' with LRAD without pain    Time 8   Period Weeks   Status New     PT LONG TERM GOAL #3   Title Patient will stand for 30 minutes without pain    Time 8   Period Weeks   Status New               Plan - 11/23/16 1302    Clinical Impression Statement Therapy checked the patients heel lift. The height of the lift is intact butit appears her foot is twisting the front portion. It is still doing its job. Shecontinues to have anterior hip pain with straight flexion and lateral hip pain when her hip is lfexed with abduction, which is how it wasnts to go. Therapy continues to work on manual therapy to improve her hip mobility.    History and Personal Factors relevant to plan of care: CHF    Clinical Presentation Evolving   Clinical Presentation due to: increasing left hip pain    Clinical Decision Making Moderate   Rehab Potential Good   PT Frequency 2x / week   PT Duration 8 weeks   PT Treatment/Interventions ADLs/Self Care Home Management;Gait training;Stair training;Iontophoresis 4mg /ml Dexamethasone;Electrical Stimulation;Cryotherapy;Moist Heat;Ultrasound;Therapeutic  activities;Therapeutic exercise;Patient/family education;Neuromuscular re-education;Splinting;Taping;Manual techniques;Passive range of motion   PT Next Visit Plan Continue to work on  exercises to stregthen bilateral lower extremitys. Continue to work on hip mobility.     PT Home Exercise Plan single knee to chest with towel; lateral trunk rotation; quad sets, glut sets; LAQ clams, tilt, Transverse abdominal contraction with clams and bent knee raises.    Consulted and Agree with Plan of Care Patient      Patient will benefit from skilled therapeutic intervention in order to improve the following deficits and impairments:  Abnormal gait, Decreased range of motion, Difficulty walking, Increased muscle spasms, Decreased endurance, Decreased activity tolerance, Pain, Decreased strength, Postural dysfunction  Visit Diagnosis: Pain in right hip  Chronic left-sided low back pain without sciatica  Pain in left hip  Stiffness of right hip, not elsewhere classified     Problem List Patient Active Problem List   Diagnosis Date Noted  . Bilateral carotid artery disease ([place]) 11/10/2016  . Left flank pain 10/11/2016  . Seasonal allergies 06/30/2016  . Skin lesion of foot 03/08/2016  . Financial difficulties 03/08/2016  . Healthcare maintenance 06/16/2015  . Nonproliferative diabetic retinopathy associated with type 2 diabetes mellitus ([place]) 06/15/2015  . Osteoarthritis of right hip 06/09/2015  . Arthritis of left hip 08/18/2013  . DM (diabetes mellitus) type II uncontrolled, periph vascular disorder ([place]) 07/17/2013  . Anemia 07/17/2013  . Chronic combined systolic and diastolic CHF, NYHA class 2 ([place]) 07/07/2013  . PVD (peripheral vascular disease) 11/28/2012  . CKD (chronic kidney disease) 11/22/2012  . HTN (hypertension) 11/08/2012  . Atherosclerosis of native arteries of the extremities with ulceration ([place]) 08/16/2012    Carney Living PT DPT  11/23/2016, 1:13 PM  [facility] [address] [place], [place], [address] Phone: [phone]   Fax:  [phone]  Name: [name] MRN: [id number] Date of Birth: June 19, 1955

## 2016-11-23 NOTE — Telephone Encounter (Signed)
Pt calling about medication instructions for Myoview tomorrow.  States she received call yesterday but she was driving so she just wanted to be sure of instructions.  Reviewed these with her.  Pt verbalized understanding and was appreciative for call.

## 2016-11-23 NOTE — Telephone Encounter (Signed)
New message    Pt is calling about appt tomorrow. She has some questions about her medication before her appt.

## 2016-11-24 ENCOUNTER — Ambulatory Visit (HOSPITAL_COMMUNITY): Payer: No Typology Code available for payment source | Attending: Cardiology

## 2016-11-24 ENCOUNTER — Ambulatory Visit (HOSPITAL_BASED_OUTPATIENT_CLINIC_OR_DEPARTMENT_OTHER): Payer: No Typology Code available for payment source

## 2016-11-24 DIAGNOSIS — I083 Combined rheumatic disorders of mitral, aortic and tricuspid valves: Secondary | ICD-10-CM | POA: Insufficient documentation

## 2016-11-24 DIAGNOSIS — I5042 Chronic combined systolic (congestive) and diastolic (congestive) heart failure: Secondary | ICD-10-CM

## 2016-11-24 DIAGNOSIS — I110 Hypertensive heart disease with heart failure: Secondary | ICD-10-CM | POA: Insufficient documentation

## 2016-11-24 DIAGNOSIS — R0609 Other forms of dyspnea: Secondary | ICD-10-CM | POA: Insufficient documentation

## 2016-11-24 DIAGNOSIS — R079 Chest pain, unspecified: Secondary | ICD-10-CM | POA: Insufficient documentation

## 2016-11-24 DIAGNOSIS — R9439 Abnormal result of other cardiovascular function study: Secondary | ICD-10-CM | POA: Insufficient documentation

## 2016-11-24 LAB — MYOCARDIAL PERFUSION IMAGING
CHL CUP NUCLEAR SDS: 8
CHL CUP RESTING HR STRESS: 71 {beats}/min
CSEPPHR: 88 {beats}/min
LHR: 0.31
LV dias vol: 82 mL (ref 46–106)
LVSYSVOL: 36 mL
SRS: 5
SSS: 13
TID: 1.09

## 2016-11-24 LAB — ECHOCARDIOGRAM COMPLETE
HEIGHTINCHES: 63 in
WEIGHTICAEL: 2752 [oz_av]

## 2016-11-24 MED ORDER — REGADENOSON 0.4 MG/5ML IV SOLN
0.4000 mg | Freq: Once | INTRAVENOUS | Status: AC
  Administered 2016-11-24: 0.4 mg via INTRAVENOUS

## 2016-11-24 MED ORDER — TECHNETIUM TC 99M TETROFOSMIN IV KIT
33.0000 | PACK | Freq: Once | INTRAVENOUS | Status: AC | PRN
  Administered 2016-11-24: 33 via INTRAVENOUS
  Filled 2016-11-24: qty 33

## 2016-11-24 MED ORDER — TECHNETIUM TC 99M TETROFOSMIN IV KIT
10.8000 | PACK | Freq: Once | INTRAVENOUS | Status: AC | PRN
  Administered 2016-11-24: 10.8 via INTRAVENOUS
  Filled 2016-11-24: qty 11

## 2016-11-25 ENCOUNTER — Telehealth: Payer: Self-pay | Admitting: Interventional Cardiology

## 2016-11-25 NOTE — Telephone Encounter (Signed)
Follow up    Pt is returning call about test results.

## 2016-11-25 NOTE — Telephone Encounter (Signed)
Follow Up:     Returning your call, concerning her test results. 

## 2016-11-28 MED FILL — LEVEMIR 100 UNITS/ML VIAL: 100 | 42 days supply | Qty: 10 | Fill #3

## 2016-11-28 MED FILL — JANUMET 50-1,000 MG TABLET: 50-1000 | 30 days supply | Qty: 60 | Fill #1

## 2016-11-28 NOTE — Telephone Encounter (Signed)
Left message for patient to call back  

## 2016-11-28 NOTE — Telephone Encounter (Signed)
Follow Up:    Returning [name]'s call from [place] her Stress Test and Echo results.

## 2016-11-29 NOTE — Telephone Encounter (Signed)
Patient made aware of results. Patient verbalizes understanding.  

## 2016-11-29 NOTE — Telephone Encounter (Signed)
-----   Message from [name], MD sent at 11/25/2016 11:35 AM EDT ----- Let the patient know the heart pumping function has improved. The ejection fraction is now greater than 50%. There is mild pulmonary hypertension. The aortic valve has calcification but no significant obstruction. There is mild pulmonary hypertension. Overall the study is improved compared to prior study done in 2015. A copy will be sent to Melanee Spry, MD

## 2016-11-29 NOTE — Telephone Encounter (Signed)
-----   Message from [name], MD sent at 11/25/2016 11:36 AM EDT ----- Let the patient know the nuclear study suggests mild reduction in blood flow on the lateral inferior wall. With improvement in heart function, this can be easily treated with medication. Overall good news. A copy will be sent to Melanee Spry, MD

## 2016-11-30 ENCOUNTER — Ambulatory Visit: Payer: No Typology Code available for payment source | Attending: Internal Medicine | Admitting: Physical Therapy

## 2016-11-30 ENCOUNTER — Encounter: Payer: Self-pay | Admitting: Physical Therapy

## 2016-11-30 DIAGNOSIS — G8929 Other chronic pain: Secondary | ICD-10-CM | POA: Insufficient documentation

## 2016-11-30 DIAGNOSIS — R262 Difficulty in walking, not elsewhere classified: Secondary | ICD-10-CM | POA: Insufficient documentation

## 2016-11-30 DIAGNOSIS — M25651 Stiffness of right hip, not elsewhere classified: Secondary | ICD-10-CM | POA: Insufficient documentation

## 2016-11-30 DIAGNOSIS — M545 Low back pain, unspecified: Secondary | ICD-10-CM

## 2016-11-30 DIAGNOSIS — M25551 Pain in right hip: Secondary | ICD-10-CM

## 2016-11-30 DIAGNOSIS — M25552 Pain in left hip: Secondary | ICD-10-CM | POA: Insufficient documentation

## 2016-11-30 NOTE — Therapy (Addendum)
[place], [place], [address] Phone: [phone]   Fax:  [phone]  Physical Therapy Treatment  Patient Details  Name: [name] MRN: [id number] Date of Birth: [birth year] Referring Provider: Dr Georga Kaufmann   Encounter Date: 11/30/2016      PT End of Session - 11/30/16 1529    Visit Number 6   Number of Visits 16   Date for PT Re-Evaluation 12/22/16   Authorization Type CAFA    PT Start Time 1013   PT Stop Time 1107   PT Time Calculation (min) 54 min   Activity Tolerance Patient tolerated treatment well   Behavior During Therapy [facility] for tasks assessed/performed      Past Medical History:  Diagnosis Date  . Arthritis   . CHF (congestive heart failure) ([place])    2014...@ [facility]  . Diabetes mellitus    diagnosed 2001  . Hypertension   . PVD (peripheral vascular disease) ([place])     Past Surgical History:  Procedure Laterality Date  . ABDOMINAL ANGIOGRAM  12/28/2012  . ABDOMINAL ANGIOGRAM N/A 12/29/2011   Procedure: ABDOMINAL ANGIOGRAM;  Surgeon: Conrad Acme, MD;  Location: [facility] CATH LAB;  Service: Cardiovascular;  Laterality: N/A;  . ABDOMINAL AORTAGRAM N/A 12/21/2012   Procedure: ABDOMINAL Maxcine Ham;  Surgeon: Elam Dutch, MD;  Location: [facility] CATH LAB;  Service: Cardiovascular;  Laterality: N/A;  . TOTAL HIP ARTHROPLASTY Left 08/19/2013   Procedure: TOTAL HIP ARTHROPLASTY;  Surgeon: Kerin Salen, MD;  Location: [facility];  Service: Orthopedics;  Laterality: Left;  . TUBAL LIGATION      There were no vitals filed for this visit.      Subjective Assessment - 11/30/16 1047    Subjective Patient has been lifting her grandson over the weekend. She is now having increased pain in her left lower back and into her hip. She feels like now that she is sore she is having more problems with her back.    Pertinent History Left total hip replacement, DMII, CHF,    Limitations Standing;Walking;Lifting    Currently in Pain? Yes   Pain Score 7    Pain Location Back   Pain Orientation Left   Pain Descriptors / Indicators Aching   Pain Type Acute pain   Pain Onset More than a month ago   Pain Frequency Constant   Aggravating Factors  standing, walking, movemenmtn of the hip.    Pain Relieving Factors changing positions    Effect of Pain on Daily Activities difficulty with ADL's                          [facility] Adult PT Treatment/Exercise - 11/30/16 0001      Lumbar Exercises: Stretches   Single Knee to Chest Stretch Limitations with a towel 3x15 seconds tried prior to manual therapy and the patient had pain; tried after with improved range and decreased pain. Patient advised to try to keep new range at home.    Pelvic Tilt Limitations 10 x 5 sec hold good technique noted.    Piriformis Stretch Limitations left 2x30 sec      Lumbar Exercises: Seated   Other Seated Lumbar Exercises seated PPT 2x10; PPT with ball squeeze 2x10; PPT with clam shell;      Knee/Hip Exercises: Seated   Long Arc Quad Limitations 3x10      Manual Therapy   Manual therapy comments PROM right hip flexion,  Ir, Er; posterior hip mobilization and  LAD to improve knee mobility. Soft tissue mobilization to the lumbar spine; Gluteal roll out.                 PT Education - 11/30/16 1524    Education provided Yes   Education Details reviewed exercises she can do in sitting    Person(s) Educated Patient   Methods Explanation;Demonstration;Verbal cues;Tactile cues;Handout   Comprehension Verbalized understanding;Returned demonstration;Need further instruction          PT Short Term Goals - 11/23/16 1311      PT SHORT TERM GOAL #1   Title Patient will demsotrate a good core contraction    Baseline excellent posteriro pelvic tilt demonstrated today    Time 4   Period Weeks   Status On-going     PT SHORT TERM GOAL #2   Title Patient will increase right and left hip flexion by 20 degrees  bilateral   Baseline improved from 50 to 78 supine   Time 4   Period Weeks   Status On-going     PT SHORT TERM GOAL #3   Title Patient will demsotrate 4/5 gross bilateral lower extremity strength    Time 4   Period Weeks   Status On-going     PT SHORT TERM GOAL #4   Title Patient will be independent with initial HEP    Baseline independent with exercises so far   Time 4   Period Weeks   Status On-going           PT Long Term Goals - 10/27/16 1130      PT LONG TERM GOAL #1   Title Patient will transfer sit to stand without hands and without pain    Time 8   Period Weeks   Status New     PT LONG TERM GOAL #2   Title Patient will ambualte 3000' with LRAD without pain    Time 8   Period Weeks   Status New     PT LONG TERM GOAL #3   Title Patient will stand for 30 minutes without pain    Time 8   Period Weeks   Status New               Plan - 11/30/16 1530    Clinical Impression Statement Patient was limited by left back and hip pain today. Therapy reviewed light strething and self soft tissue mobilization activity with the patient. She flet better after the session. She is somewqhat discouraged by the fact she is not walking much better with her right leg. With her advanced arthritis therapy educated her on the fact that she will have difficulty walking perfectly. All we are trying to do is improve her gait and pain leve;l with gait . She was given exercises she can do seated at home and therapy reviewed core stabilization and breathing.    Clinical Presentation Evolving   Clinical Decision Making Moderate   Rehab Potential Good   PT Frequency 2x / week   PT Duration 8 weeks   PT Treatment/Interventions ADLs/Self Care Home Management;Gait training;Stair training;Iontophoresis 4mg /ml Dexamethasone;Electrical Stimulation;Cryotherapy;Moist Heat;Ultrasound;Therapeutic activities;Therapeutic exercise;Patient/family education;Neuromuscular  re-education;Splinting;Taping;Manual techniques;Passive range of motion   PT Next Visit Plan Continue to work on exercises to stregthen bilateral lower extremitys. Continue to work on hip mobility.     PT Home Exercise Plan single knee to chest with towel; lateral trunk rotation; quad sets, glut sets; LAQ clams, tilt, Transverse abdominal  contraction with clams and bent knee raises.    Consulted and Agree with Plan of Care Patient      Patient will benefit from skilled therapeutic intervention in order to improve the following deficits and impairments:  Abnormal gait, Decreased range of motion, Difficulty walking, Increased muscle spasms, Decreased endurance, Decreased activity tolerance, Pain, Decreased strength, Postural dysfunction  Visit Diagnosis: Pain in right hip  Chronic left-sided low back pain without sciatica  Pain in left hip  Stiffness of right hip, not elsewhere classified  Difficulty in walking, not elsewhere classified     Problem List Patient Active Problem List   Diagnosis Date Noted  . Bilateral carotid artery disease ([place]) 11/10/2016  . Left flank pain 10/11/2016  . Seasonal allergies 06/30/2016  . Skin lesion of foot 03/08/2016  . Financial difficulties 03/08/2016  . Healthcare maintenance 06/16/2015  . Nonproliferative diabetic retinopathy associated with type 2 diabetes mellitus ([place]) 06/15/2015  . Osteoarthritis of right hip 06/09/2015  . Arthritis of left hip 08/18/2013  . DM (diabetes mellitus) type II uncontrolled, periph vascular disorder ([place]) 07/17/2013  . Anemia 07/17/2013  . Chronic combined systolic and diastolic CHF, NYHA class 2 ([place]) 07/07/2013  . PVD (peripheral vascular disease) 11/28/2012  . CKD (chronic kidney disease) 11/22/2012  . HTN (hypertension) 11/08/2012  . Atherosclerosis of native arteries of the extremities with ulceration ([place]) 08/16/2012    Carney Living PT DPT  11/30/2016, 3:58 PM  [facility] [address] [place], [place], [address] Phone: [phone]   Fax:  [phone]  Name: [name] MRN: [id number] Date of Birth: 14-Oct-1955

## 2016-12-02 MED FILL — CARVEDILOL 12.5 MG TABLET: 12.5 | 30 days supply | Qty: 60 | Fill #4

## 2016-12-05 ENCOUNTER — Telehealth: Payer: Self-pay | Admitting: General Practice

## 2016-12-05 NOTE — Telephone Encounter (Signed)
Pt called requesting information to how schedule an appt with Dr. Earleen Newport (podiatry) please call her back when you get chance

## 2016-12-06 NOTE — Telephone Encounter (Signed)
I spoke to patient she is going to contact her [facility] so they ca refer them to Podiatry clinic

## 2016-12-07 ENCOUNTER — Encounter: Payer: No Typology Code available for payment source | Admitting: Physical Therapy

## 2016-12-08 ENCOUNTER — Encounter: Payer: Self-pay | Admitting: Physical Therapy

## 2016-12-08 ENCOUNTER — Ambulatory Visit: Payer: No Typology Code available for payment source | Admitting: Physical Therapy

## 2016-12-08 DIAGNOSIS — M545 Low back pain, unspecified: Secondary | ICD-10-CM

## 2016-12-08 DIAGNOSIS — M25651 Stiffness of right hip, not elsewhere classified: Secondary | ICD-10-CM

## 2016-12-08 DIAGNOSIS — M25552 Pain in left hip: Secondary | ICD-10-CM

## 2016-12-08 DIAGNOSIS — G8929 Other chronic pain: Secondary | ICD-10-CM

## 2016-12-08 DIAGNOSIS — R262 Difficulty in walking, not elsewhere classified: Secondary | ICD-10-CM

## 2016-12-08 DIAGNOSIS — M25551 Pain in right hip: Secondary | ICD-10-CM

## 2016-12-08 NOTE — Therapy (Signed)
[place], [place], [address] Phone: [phone]   Fax:  [phone]  Physical Therapy Treatment  Patient Details  Name: [name] MRN: [id number] Date of Birth: Dec 03, 1955 Referring Provider: Dr Georga Kaufmann   Encounter Date: 12/08/2016      PT End of Session - 12/08/16 1608    Visit Number 7   Number of Visits 16   Date for PT Re-Evaluation 12/22/16   Authorization Type CAFA    PT Start Time 0800   PT Stop Time 0842   PT Time Calculation (min) 42 min   Activity Tolerance Patient tolerated treatment well   Behavior During Therapy [facility] for tasks assessed/performed      Past Medical History:  Diagnosis Date  . Arthritis   . CHF (congestive heart failure) ([place])    2014...@ [facility]  . Diabetes mellitus    diagnosed 2001  . Hypertension   . PVD (peripheral vascular disease) ([place])     Past Surgical History:  Procedure Laterality Date  . ABDOMINAL ANGIOGRAM  12/28/2012  . ABDOMINAL ANGIOGRAM N/A 12/29/2011   Procedure: ABDOMINAL ANGIOGRAM;  Surgeon: Conrad Wells Branch, MD;  Location: [facility] CATH LAB;  Service: Cardiovascular;  Laterality: N/A;  . ABDOMINAL AORTAGRAM N/A 12/21/2012   Procedure: ABDOMINAL Maxcine Ham;  Surgeon: Elam Dutch, MD;  Location: [facility] CATH LAB;  Service: Cardiovascular;  Laterality: N/A;  . TOTAL HIP ARTHROPLASTY Left 08/19/2013   Procedure: TOTAL HIP ARTHROPLASTY;  Surgeon: Kerin Salen, MD;  Location: [place];  Service: Orthopedics;  Laterality: Left;  . TUBAL LIGATION      There were no vitals filed for this visit.      Subjective Assessment - 12/08/16 0805    Subjective Patient reports her pain in her hip is at about a 6/10 today. she still has some pain when she moves certain ways. She has been using the tennis ball for soft tissue relelase.    Pertinent History Left total hip replacement, DMII, CHF,    Limitations Standing;Walking;Lifting                          [facility] Adult PT Treatment/Exercise - 12/08/16 0001      Lumbar Exercises: Stretches   Single Knee to Chest Stretch Limitations with a towel 3x15 seconds tried prior to manual therapy and the patient had pain; tried after with improved range and decreased pain. Patient advised to try to keep new range at home.    Lower Trunk Rotation Limitations x10    Pelvic Tilt Limitations 10 x 5 sec hold good technique noted.    Piriformis Stretch Limitations bilateral 2x30 sec      Lumbar Exercises: Seated   Other Seated Lumbar Exercises seated PPT 2x10; PPT with ball squeeze 2x10; PPT with clam shell;      Lumbar Exercises: Supine   Ab Set 10 reps   AB Set Limitations Transverse abdominal training.    Clam 20 reps   Clam Limitations red band, abdominal draw in    Bent Knee Raise 20 reps   Bent Knee Raise Limitations with abdominal draw in   Bridge Limitations glute set with mini bridge x 10      Manual Therapy   Manual therapy comments PROM right hip flexion, Ir, Er; posterior hip mobilization and  LAD to improve knee mobility. Soft tissue mobilization to the lumbar spine; Gluteal roll out.  PT Short Term Goals - 11/23/16 1311      PT SHORT TERM GOAL #1   Title Patient will demsotrate a good core contraction    Baseline excellent posteriro pelvic tilt demonstrated today    Time 4   Period Weeks   Status On-going     PT SHORT TERM GOAL #2   Title Patient will increase right and left hip flexion by 20 degrees bilateral   Baseline improved from 50 to 78 supine   Time 4   Period Weeks   Status On-going     PT SHORT TERM GOAL #3   Title Patient will demsotrate 4/5 gross bilateral lower extremity strength    Time 4   Period Weeks   Status On-going     PT SHORT TERM GOAL #4   Title Patient will be independent with initial HEP    Baseline independent with exercises so far   Time 4   Period Weeks   Status On-going            PT Long Term Goals - 10/27/16 1130      PT LONG TERM GOAL #1   Title Patient will transfer sit to stand without hands and without pain    Time 8   Period Weeks   Status New     PT LONG TERM GOAL #2   Title Patient will ambualte 3000' with LRAD without pain    Time 8   Period Weeks   Status New     PT LONG TERM GOAL #3   Title Patient will stand for 30 minutes without pain    Time 8   Period Weeks   Status New               Plan - 12/08/16 1603    Clinical Impression Statement Significant improvement in back pain compared to last visit.  The patient continues to have some right hip pain but it has improved. Hip flexion and ER have improved hip IR is still very limited.    Clinical Presentation Evolving   Clinical Decision Making Moderate   Rehab Potential Good   PT Frequency 2x / week   PT Duration 8 weeks   PT Treatment/Interventions ADLs/Self Care Home Management;Gait training;Stair training;Iontophoresis 4mg /ml Dexamethasone;Electrical Stimulation;Cryotherapy;Moist Heat;Ultrasound;Therapeutic activities;Therapeutic exercise;Patient/family education;Neuromuscular re-education;Splinting;Taping;Manual techniques;Passive range of motion   PT Next Visit Plan Continue to work on exercises to stregthen bilateral lower extremitys. Continue to work on hip mobility.     PT Home Exercise Plan single knee to chest with towel; lateral trunk rotation; quad sets, glut sets; LAQ clams, tilt, Transverse abdominal contraction with clams and bent knee raises.    Consulted and Agree with Plan of Care Patient      Patient will benefit from skilled therapeutic intervention in order to improve the following deficits and impairments:  Abnormal gait, Decreased range of motion, Difficulty walking, Increased muscle spasms, Decreased endurance, Decreased activity tolerance, Pain, Decreased strength, Postural dysfunction  Visit Diagnosis: Pain in right hip  Chronic left-sided low  back pain without sciatica  Pain in left hip  Stiffness of right hip, not elsewhere classified  Difficulty in walking, not elsewhere classified     Problem List Patient Active Problem List   Diagnosis Date Noted  . Bilateral carotid artery disease ([place]) 11/10/2016  . Left flank pain 10/11/2016  . Seasonal allergies 06/30/2016  . Skin lesion of foot 03/08/2016  . Financial difficulties 03/08/2016  . Healthcare maintenance 06/16/2015  . Nonproliferative  diabetic retinopathy associated with type 2 diabetes mellitus ([place]) 06/15/2015  . Osteoarthritis of right hip 06/09/2015  . Arthritis of left hip 08/18/2013  . DM (diabetes mellitus) type II uncontrolled, periph vascular disorder ([place]) 07/17/2013  . Anemia 07/17/2013  . Chronic combined systolic and diastolic CHF, NYHA class 2 ([place]) 07/07/2013  . PVD (peripheral vascular disease) 11/28/2012  . CKD (chronic kidney disease) 11/22/2012  . HTN (hypertension) 11/08/2012  . Atherosclerosis of native arteries of the extremities with ulceration ([place]) 08/16/2012    Carney Living  PT DPT  12/08/2016, 4:09 PM  [facility] [address] [place], [place], [address] Phone: [phone]   Fax:  [phone]  Name: REED EIFERT MRN: [id number] Date of Birth: 1955-12-25

## 2016-12-12 ENCOUNTER — Ambulatory Visit: Payer: No Typology Code available for payment source | Admitting: Physical Therapy

## 2016-12-12 DIAGNOSIS — M545 Low back pain, unspecified: Secondary | ICD-10-CM

## 2016-12-12 DIAGNOSIS — M25551 Pain in right hip: Secondary | ICD-10-CM

## 2016-12-12 DIAGNOSIS — M25552 Pain in left hip: Secondary | ICD-10-CM

## 2016-12-12 DIAGNOSIS — R262 Difficulty in walking, not elsewhere classified: Secondary | ICD-10-CM

## 2016-12-12 DIAGNOSIS — G8929 Other chronic pain: Secondary | ICD-10-CM

## 2016-12-12 DIAGNOSIS — M25651 Stiffness of right hip, not elsewhere classified: Secondary | ICD-10-CM

## 2016-12-12 MED FILL — LISINOPRIL 40 MG TAB: 40 | 30 days supply | Qty: 30 | Fill #5

## 2016-12-12 MED FILL — ATORVASTATIN 40 MG TABLET: 40 | 30 days supply | Qty: 30 | Fill #6

## 2016-12-12 NOTE — Therapy (Signed)
[place], [place], [address] Phone: [phone]   Fax:  [phone]  Physical Therapy Treatment  Patient Details  Name: [name] MRN: [id number] Date of Birth: 31-May-1955 Referring Provider: Dr Georga Kaufmann   Encounter Date: 12/12/2016      PT End of Session - 12/12/16 1225    Visit Number 8   Number of Visits 16   Date for PT Re-Evaluation 12/22/16   Authorization Type CAFA    PT Start Time 0935   PT Stop Time 1015   PT Time Calculation (min) 40 min   Activity Tolerance Patient tolerated treatment well   Behavior During Therapy [facility] for tasks assessed/performed      Past Medical History:  Diagnosis Date  . Arthritis   . CHF (congestive heart failure) ([place])    2014...@ [facility]  . Diabetes mellitus    diagnosed 2001  . Hypertension   . PVD (peripheral vascular disease) ([place])     Past Surgical History:  Procedure Laterality Date  . ABDOMINAL ANGIOGRAM  12/28/2012  . ABDOMINAL ANGIOGRAM N/A 12/29/2011   Procedure: ABDOMINAL ANGIOGRAM;  Surgeon: Conrad Fitzgerald, MD;  Location: [facility] CATH LAB;  Service: Cardiovascular;  Laterality: N/A;  . ABDOMINAL AORTAGRAM N/A 12/21/2012   Procedure: ABDOMINAL Maxcine Ham;  Surgeon: Elam Dutch, MD;  Location: [facility] CATH LAB;  Service: Cardiovascular;  Laterality: N/A;  . TOTAL HIP ARTHROPLASTY Left 08/19/2013   Procedure: TOTAL HIP ARTHROPLASTY;  Surgeon: Kerin Salen, MD;  Location: [place];  Service: Orthopedics;  Laterality: Left;  . TUBAL LIGATION      There were no vitals filed for this visit.      Subjective Assessment - 12/12/16 0939    Subjective This morning my back is about 7/10.    Currently in Pain? Yes   Pain Score 7    Pain Location Back   Pain Orientation Left   Pain Descriptors / Indicators Aching   Pain Type Chronic pain   Pain Onset More than a month ago   Pain Frequency Constant   Aggravating Factors  stand, walk, activity    Pain  Relieving Factors hard to get any relief, rest    Pain Score 7   Pain Location Hip   Pain Orientation Right   Pain Type Chronic pain   Pain Onset More than a month ago   Pain Frequency Constant            [facility] PT Assessment - 12/12/16 0001      Strength   Right Hip Flexion 3+/5   Right Hip ABduction 3/5   Left Hip Flexion 4/5   Left Hip ABduction 3+/5                     [facility] Adult PT Treatment/Exercise - 12/12/16 0001      Self-Care   Self-Care Posture;Other Self-Care Comments   Posture seated   Other Self-Care Comments  sexual positons for back pain      Lumbar Exercises: Stretches   Single Knee to Chest Stretch 2 reps;20 seconds   Lower Trunk Rotation 10 seconds   Lower Trunk Rotation Limitations x10 incr ROM    Pelvic Tilt Limitations 10 x 5 sec hold good technique noted.      Lumbar Exercises: Supine   Clam 20 reps   Clam Limitations red band    Bent Knee Raise 20 reps   Bent Knee Raise Limitations red band  Knee/Hip Exercises: Seated   Knee/Hip Flexion march on balance disc    Other Seated Knee/Hip Exercises seated pelvic tilts x 10 on disc ,upperr trunk rotation x 3 each side core stability and stretching , lateral flexion each side x 3      Knee/Hip Exercises: Sidelying   Hip ABduction Strengthening;Both;2 sets;10 reps   Clams x 20 each leg                 PT Education - 12/12/16 1012    Education provided Yes   Education Details sex with back pain, hip strengthening    Person(s) Educated Patient   Methods Handout;Explanation   Comprehension Verbalized understanding          PT Short Term Goals - 12/12/16 1226      PT SHORT TERM GOAL #1   Title Patient will demsotrate a good core contraction    Baseline with PPT    Status Achieved     PT SHORT TERM GOAL #2   Title Patient will increase right and left hip flexion by 20 degrees bilateral   Status Unable to assess     PT SHORT TERM GOAL #3   Title Patient will  demsotrate 4/5 gross bilateral lower extremity strength    Baseline hip abd 3/5 - 3+/5 and flex 4-/5   Status Partially Met     PT SHORT TERM GOAL #4   Title Patient will be independent with initial HEP    Status Achieved           PT Long Term Goals - 12/12/16 1228      PT LONG TERM GOAL #1   Title Patient will transfer sit to stand without hands and without pain    Status On-going     PT LONG TERM GOAL #2   Title Patient will ambualte 3000' with LRAD without pain    Status On-going     PT LONG TERM GOAL #3   Title Patient will stand for 30 minutes without pain    Status On-going               Plan - 12/12/16 0941    Clinical Impression Statement Patient is 1/2 muscle grade stronger in hips, although still very limited 3/5 in abduction. She could benefit from using a walked to avoid minimize back pain.    PT Next Visit Plan Continue to work on exercises to stregthen bilateral lower extremitys. Continue to work on hip mobility.  Try walker.    PT Home Exercise Plan single knee to chest with towel; lateral trunk rotation; quad sets, glut sets; LAQ clams, tilt, Transverse abdominal contraction with clams and bent knee raises.    Consulted and Agree with Plan of Care Patient      Patient will benefit from skilled therapeutic intervention in order to improve the following deficits and impairments:  Abnormal gait, Decreased range of motion, Difficulty walking, Increased muscle spasms, Decreased endurance, Decreased activity tolerance, Pain, Decreased strength, Postural dysfunction  Visit Diagnosis: Pain in right hip  Pain in left hip  Chronic left-sided low back pain without sciatica  Stiffness of right hip, not elsewhere classified  Difficulty in walking, not elsewhere classified     Problem List Patient Active Problem List   Diagnosis Date Noted  . Bilateral carotid artery disease ([place]) 11/10/2016  . Left flank pain 10/11/2016  . Seasonal allergies  06/30/2016  . Skin lesion of foot 03/08/2016  . Financial difficulties 03/08/2016  .  Healthcare maintenance 06/16/2015  . Nonproliferative diabetic retinopathy associated with type 2 diabetes mellitus ([place]) 06/15/2015  . Osteoarthritis of right hip 06/09/2015  . Arthritis of left hip 08/18/2013  . DM (diabetes mellitus) type II uncontrolled, periph vascular disorder ([place]) 07/17/2013  . Anemia 07/17/2013  . Chronic combined systolic and diastolic CHF, NYHA class 2 ([place]) 07/07/2013  . PVD (peripheral vascular disease) 11/28/2012  . CKD (chronic kidney disease) 11/22/2012  . HTN (hypertension) 11/08/2012  . Atherosclerosis of native arteries of the extremities with ulceration ([place]) 08/16/2012    PAA,[name] 12/12/2016, 12:46 PM  [place], [place], [address] Phone: [phone]   Fax:  [phone]  Name: [name] MRN: [id number] Date of Birth: 01-05-1956  Raeford Razor, PT 12/12/16 12:47 PM Phone: [phone] Fax: [phone]

## 2016-12-12 NOTE — Patient Instructions (Signed)
Abduction: Clam (Eccentric) - Side-Lying    Lie on side with knees bent. Lift top knee, keeping feet together. Keep trunk steady. Slowly lower for 3-5 seconds. _10__ reps per set, _2-3__ sets per day, __5 days per week. Add ___ lbs when you achieve ___ repetitions.  http://ecce.exer.us/65   Copyright  VHI. All rights reserved.    Abduction: Side Leg Lift (Eccentric) - Side-Lying    Lie on side. Lift top leg slightly higher than shoulder level. Keep top leg straight with body, toes pointing forward. Slowly lower for 3-5 seconds. _10__ reps per set, __2-3_ sets per day, ___5 days per week.   http://ecce.exer.us/63   Copyright  VHI. All rights reserved.

## 2016-12-14 ENCOUNTER — Other Ambulatory Visit: Payer: Self-pay | Admitting: *Deleted

## 2016-12-14 ENCOUNTER — Encounter: Payer: Self-pay | Admitting: Physical Therapy

## 2016-12-14 ENCOUNTER — Ambulatory Visit: Payer: No Typology Code available for payment source | Admitting: Physical Therapy

## 2016-12-14 DIAGNOSIS — M545 Low back pain, unspecified: Secondary | ICD-10-CM

## 2016-12-14 DIAGNOSIS — G8929 Other chronic pain: Secondary | ICD-10-CM

## 2016-12-14 DIAGNOSIS — R262 Difficulty in walking, not elsewhere classified: Secondary | ICD-10-CM

## 2016-12-14 DIAGNOSIS — M25651 Stiffness of right hip, not elsewhere classified: Secondary | ICD-10-CM

## 2016-12-14 DIAGNOSIS — M25551 Pain in right hip: Secondary | ICD-10-CM

## 2016-12-14 DIAGNOSIS — M25552 Pain in left hip: Secondary | ICD-10-CM

## 2016-12-14 MED ORDER — AMLODIPINE BESYLATE 10 MG PO TABS: 10.0000 mg | ORAL_TABLET | Freq: Every day | ORAL | 1 refills | Status: DC

## 2016-12-14 NOTE — Therapy (Signed)
[place], [place], [address] Phone: [phone]   Fax:  [phone]  Physical Therapy Treatment  Patient Details  Name: [name] MRN: [id number] Date of Birth: Feb 10, 1956 Referring Provider: Dr Georga Kaufmann   Encounter Date: 12/14/2016      PT End of Session - 12/14/16 1306    Visit Number 9   Number of Visits 16   Date for PT Re-Evaluation 12/22/16   PT Start Time 1017   PT Stop Time 1100   PT Time Calculation (min) 43 min   Activity Tolerance Patient tolerated treatment well   Behavior During Therapy [facility] for tasks assessed/performed      Past Medical History:  Diagnosis Date  . Arthritis   . CHF (congestive heart failure) ([place])    2014...@ [facility]  . Diabetes mellitus    diagnosed 2001  . Hypertension   . PVD (peripheral vascular disease) ([place])     Past Surgical History:  Procedure Laterality Date  . ABDOMINAL ANGIOGRAM  12/28/2012  . ABDOMINAL ANGIOGRAM N/A 12/29/2011   Procedure: ABDOMINAL ANGIOGRAM;  Surgeon: Conrad [place], MD;  Location: [facility] CATH LAB;  Service: Cardiovascular;  Laterality: N/A;  . ABDOMINAL AORTAGRAM N/A 12/21/2012   Procedure: ABDOMINAL Maxcine Ham;  Surgeon: Elam Dutch, MD;  Location: [facility] CATH LAB;  Service: Cardiovascular;  Laterality: N/A;  . TOTAL HIP ARTHROPLASTY Left 08/19/2013   Procedure: TOTAL HIP ARTHROPLASTY;  Surgeon: Kerin Salen, MD;  Location: [place];  Service: Orthopedics;  Laterality: Left;  . TUBAL LIGATION      There were no vitals filed for this visit.      Subjective Assessment - 12/14/16 1211    Subjective My back is OK. My hip is 8/10   Currently in Pain? No/denies   Pain Location Back   Pain Score 8   Pain Location Hip   Pain Orientation Right   Pain Type Chronic pain   Pain Frequency Constant   Aggravating Factors  getting up from soft low surfaces.  walking                         [facility] Adult PT  Treatment/Exercise - 12/14/16 0001      Ambulation/Gait   Ambulation/Gait Yes   Assistive device --  front wheeled walker   Gait Pattern Step-to pattern;Step-through pattern   Gait Comments transistion for sit to stand education,  use of walker,  adjusted walker     Knee/Hip Exercises: Aerobic   Nustep 6 minutes     Knee/Hip Exercises: Seated   Ball Squeeze 10 X 2 sets, pillow   Clamshell with TheraBand Red  10 x 2 sets   Knee/Hip Flexion march   Other Seated Knee/Hip Exercises seated / good posture practice.   Hamstring Curl 10 reps;2 sets   Hamstring Limitations red band                PT Education - 12/14/16 1304    Education provided Yes   Education Details Gait training - walker   Person(s) Educated Patient;Child(ren)   Methods Explanation;Demonstration;Verbal cues   Comprehension Verbalized understanding;Returned demonstration;Need further instruction          PT Short Term Goals - 12/12/16 1226      PT SHORT TERM GOAL #1   Title Patient will demsotrate a good core contraction    Baseline with PPT    Status Achieved  PT SHORT TERM GOAL #2   Title Patient will increase right and left hip flexion by 20 degrees bilateral   Status Unable to assess     PT SHORT TERM GOAL #3   Title Patient will demsotrate 4/5 gross bilateral lower extremity strength    Baseline hip abd 3/5 - 3+/5 and flex 4-/5   Status Partially Met     PT SHORT TERM GOAL #4   Title Patient will be independent with initial HEP    Status Achieved           PT Long Term Goals - 12/12/16 1228      PT LONG TERM GOAL #1   Title Patient will transfer sit to stand without hands and without pain    Status On-going     PT LONG TERM GOAL #2   Title Patient will ambualte 3000' with LRAD without pain    Status On-going     PT LONG TERM GOAL #3   Title Patient will stand for 30 minutes without pain    Status On-going               Plan - 12/14/16 1306    Clinical  Impression Statement Patient has less antalgic gait with use of walker.  A walker that was donated was given to patient.  She was so happy and thankful she had tears of joy and gave me a big hug.  No back pain today .  Patient noted increased walking speed with decreased pain in hip.  She continues to need gait instruction for sit to stand techniques ( hand placements)   PT Treatment/Interventions ADLs/Self Care Home Management;Gait training;Stair training;Iontophoresis 27m/ml Dexamethasone;Electrical Stimulation;Cryotherapy;Moist Heat;Ultrasound;Therapeutic activities;Therapeutic exercise;Patient/family education;Neuromuscular re-education;Splinting;Taping;Manual techniques;Passive range of motion   PT Next Visit Plan Continue to work on exercises to stregthen bilateral lower extremitys. Continue to work on hip mobility. Continue walker training. .    PT Home Exercise Plan single knee to chest with towel; lateral trunk rotation; quad sets, glut sets; LAQ clams, tilt, Transverse abdominal contraction with clams and bent knee raises.    Consulted and Agree with Plan of Care Patient;Family member/caregiver   Family Member Consulted Daughter      Patient will benefit from skilled therapeutic intervention in order to improve the following deficits and impairments:  Abnormal gait, Decreased range of motion, Difficulty walking, Increased muscle spasms, Decreased endurance, Decreased activity tolerance, Pain, Decreased strength, Postural dysfunction  Visit Diagnosis: Pain in right hip  Pain in left hip  Chronic left-sided low back pain without sciatica  Stiffness of right hip, not elsewhere classified  Difficulty in walking, not elsewhere classified     Problem List Patient Active Problem List   Diagnosis Date Noted  . Bilateral carotid artery disease ([place] 11/10/2016  . Left flank pain 10/11/2016  . Seasonal allergies 06/30/2016  . Skin lesion of foot 03/08/2016  . Financial difficulties  03/08/2016  . Healthcare maintenance 06/16/2015  . Nonproliferative diabetic retinopathy associated with type 2 diabetes mellitus ([place] 06/15/2015  . Osteoarthritis of right hip 06/09/2015  . Arthritis of left hip 08/18/2013  . DM (diabetes mellitus) type II uncontrolled, periph vascular disorder ([place] 07/17/2013  . Anemia 07/17/2013  . Chronic combined systolic and diastolic CHF, NYHA class 2 ([place] 07/07/2013  . PVD (peripheral vascular disease) 11/28/2012  . CKD (chronic kidney disease) 11/22/2012  . HTN (hypertension) 11/08/2012  . Atherosclerosis of native arteries of the extremities with ulceration ([place] 08/16/2012    HARRIS,KAREN PTA  12/14/2016, 1:10 PM  [place], [place], [address] Phone: [phone]   Fax:  [phone]  Name: [name] MRN: [id number] Date of Birth: 11/18/1955

## 2016-12-15 MED FILL — AMLODIPINE BESYLATE 10 MG T: 10 | 90 days supply | Qty: 90 | Fill #0

## 2016-12-15 NOTE — Progress Notes (Signed)
Cardiology Office Note    Date:  12/16/2016   ID:  [name], DOB [birth year], MRN [id number]  PCP:  Alyssa Spry, MD  Cardiologist: Sinclair Grooms, MD   Chief Complaint  Patient presents with  . Congestive Heart Failure  . Coronary Artery Disease    History of Present Illness:  [name] is a 61 y.o. female chronic diastolic heart failure, PAD with claudication, and essential hypertension.  Reevaluation with echocardiography and myocardial scintigraphy demonstrates improvement in left ventricular function back to the normal range. Her ejection fraction is greater than or equal to 60%. Pulmonary pressures have decreased from nearly 50 mmHg down to the mid 30 to meter mercury range. She has a small region of ischemia on her myocardial perfusion images. This correlates with a recommendations of under perfusion and wall motion abnormality noted on the January 2015 study.  She has no specific cardiac complaints. Her only issue today is that she has a dry hacking cough that is gone on now for nearly 3 months. She denies orthopnea, PND, and chest pain. She has not had hemoptysis.  Past Medical History:  Diagnosis Date  . Arthritis   . CHF (congestive heart failure) ([place])    2014...@ [facility]  . Diabetes mellitus    diagnosed 2001  . Hypertension   . PVD (peripheral vascular disease) ([place])     Past Surgical History:  Procedure Laterality Date  . ABDOMINAL ANGIOGRAM  12/28/2012  . ABDOMINAL ANGIOGRAM N/A 12/29/2011   Procedure: ABDOMINAL ANGIOGRAM;  Surgeon: Conrad [place], MD;  Location: [facility] CATH LAB;  Service: Cardiovascular;  Laterality: N/A;  . ABDOMINAL AORTAGRAM N/A 12/21/2012   Procedure: ABDOMINAL Maxcine Ham;  Surgeon: Elam Dutch, MD;  Location: [facility] CATH LAB;  Service: Cardiovascular;  Laterality: N/A;  . TOTAL HIP ARTHROPLASTY Left 08/19/2013   Procedure: TOTAL HIP ARTHROPLASTY;  Surgeon: Kerin Salen, MD;  Location: [facility];  Service: Orthopedics;   Laterality: Left;  . TUBAL LIGATION      Current Medications: Outpatient Medications Prior to Visit  Medication Sig Dispense Refill  . amLODipine (NORVASC) 10 MG tablet Take 1 tablet (10 mg total) by mouth daily. 90 tablet 1  . aspirin EC 81 MG tablet Take 1 tablet (81 mg total) by mouth daily. IM program, hope fund 30 tablet 11  . atorvastatin (LIPITOR) 40 MG tablet Take 40 mg by mouth daily.  6  . BAYER MICROLET LANCETS lancets Check blood sugar three times a day. ICD 10: E11.51 100 each 12  . blood glucose meter kit and supplies Check blood sugar two times a day 1 each 0  . Blood Glucose Monitoring Suppl (CONTOUR NEXT EZ MONITOR) w/Device KIT Check blood sugar three times a day. ICD 10: E11.51 1 kit 0  . carvedilol (COREG) 12.5 MG tablet Take 1 tablet (12.5 mg total) by mouth 2 (two) times daily with a meal. IM program, hope fund 60 tablet 6  . glucose blood (BAYER CONTOUR NEXT TEST) test strip Check blood sugar three times a day. ICD 10: E11.51 100 each 12  . insulin detemir (LEVEMIR) 100 UNIT/ML injection Inject 0.2 mLs (20 Units total) into the skin at bedtime. IM program, hope fund 10 mL 11  . Insulin Syringe-Needle U-100 31G X 15/64" 0.3 ML MISC Use to take insulin one time a day 100 each 12  . loratadine (CLARITIN) 10 MG tablet Take 1 tablet (10 mg total) by mouth daily. (Patient taking differently: Take  10 mg by mouth daily as needed for allergies. ) 30 tablet 2  . sitaGLIPtin-metformin (JANUMET) 50-1000 MG tablet Take 1 tablet by mouth 2 (two) times daily with a meal. IM program (Patient taking differently: Take 2 tablets by mouth 2 (two) times daily with a meal. IM program) 60 tablet 11  . triamcinolone cream (KENALOG) 0.1 % Apply 1 application topically daily as needed for irritation.  1  . lisinopril (PRINIVIL,ZESTRIL) 40 MG tablet Take 1 tablet (40 mg total) by mouth daily. 30 tablet 11   No facility-administered medications prior to visit.      Allergies:   Plavix  [clopidogrel bisulfate]   Social History   Social History  . Marital status: Married    Spouse name: N/A  . Number of children: N/A  . Years of education: 12,college   Social History Main Topics  . Smoking status: Never Smoker  . Smokeless tobacco: Never Used  . Alcohol use No     Comment: ocassionally  . Drug use: No  . Sexual activity: Yes   Other Topics Concern  . None   Social History Narrative  . None     Family History:  The patient's family history includes Diabetes in her mother.   ROS:   Please see the history of present illness.    Recent chills, leg swelling, PND, orthopnea, shortness of breath with activity, dizziness, easy bruising, difficulty with balance, constipation, nausea, vomiting, irregular heartbeat, skipped heartbeat, chest pressure, and fatigue.  All other systems reviewed and are negative.   PHYSICAL EXAM:   VS:  BP 120/66 (BP Location: Left Arm)   Pulse 80   Ht '5\' 3"'  (1.6 m)   Wt 171 lb 9.6 oz (77.8 kg)   BMI 30.40 kg/m    GEN: Well nourished, well developed, in no acute distress  HEENT: normal  Neck: no JVD, carotid bruits, or masses Cardiac: RRR; no murmurs, rubs, or gallops,no edema  Respiratory:  clear to auscultation bilaterally, normal work of breathing GI: soft, nontender, nondistended, + BS MS: no deformity or atrophy  Skin: warm and dry, no rash Neuro:  Alert and Oriented x 3, Strength and sensation are intact Psych: euthymic mood, full affect  Wt Readings from Last 3 Encounters:  12/16/16 171 lb 9.6 oz (77.8 kg)  11/24/16 172 lb (78 kg)  11/10/16 172 lb 3.2 oz (78.1 kg)      Studies/Labs Reviewed:   EKG:  EKG  Not repeated.  Recent Labs: 06/29/2016: BUN 22; Creatinine, Ser 1.18; Potassium 4.6; Sodium 142   Lipid Panel    Component Value Date/Time   CHOL 168 11/08/2012 1331   TRIG 95 11/08/2012 1331   HDL 45 11/08/2012 1331   CHOLHDL 3.7 11/08/2012 1331   VLDL 19 11/08/2012 1331   LDLCALC 104 (H) 11/08/2012  1331    Additional studies/ records that were reviewed today include:  Stress nuclear study 11/26/16: Study Highlights     Nuclear stress EF: 57%.  There was no ST segment deviation noted during stress.  Defect 1: There is a medium defect of mild severity present in the basal inferolateral, mid inferolateral and apical lateral location.  This is a low risk study.  Findings consistent with ischemia.   Low risk stress nuclear study with a moderate area of ischemia in the distribution of a posterolateral ventricular branch of the RCA or LCx. Normal left ventricular regional and global systolic function.     Echocardiography 11/24/2016:  Study Conclusions  -  Left ventricle: The cavity size was normal. Systolic function was   normal. Wall motion was normal; there were no regional wall   motion abnormalities. Features are consistent with a pseudonormal   left ventricular filling pattern, with concomitant abnormal   relaxation and increased filling pressure (grade 2 diastolic   dysfunction). Doppler parameters are consistent with high   ventricular filling pressure. - Aortic valve: Moderate focal calcification involving the   noncoronary cusp. There was mild regurgitation. Valve area (VTI):   2.34 cm^2. Valve area (Vmax): 1.61 cm^2. Valve area (Vmean): 1.91   cm^2. Regurgitation pressure half-time: 442 ms. - Mitral valve: Severely calcified annulus. Mildly thickened   leaflets . There was mild regurgitation. - Left atrium: The atrium was mildly dilated. - Tricuspid valve: There was mild regurgitation. - Pulmonic valve: There was trivial regurgitation. - Pulmonary arteries: PA peak pressure: 39 mm Hg (S).  Impressions:  - Compared to prior echo, LVF has normalized and PASP has decreased   from 55mHg to 367mg. The right ventricular systolic pressure   was increased consistent with mild pulmonary hypertension.   ASSESSMENT:    1. Essential hypertension   2. Chronic  combined systolic and diastolic CHF, NYHA class 2 ([facility]  3. ACE-inhibitor cough   4. DM (diabetes mellitus) type II uncontrolled, periph vascular disorder ([facility]  5. Bilateral carotid artery disease ([facility]  6. Atherosclerosis of native arteries of the extremities with ulceration ([facility]     PLAN:  In order of problems listed above:  1. Excellent blood pressure control on current regimen although as noted on problem #6, the patient has a chronic cough and nasal drainage progressing over the past 3 months. Lisinopril was started earlier this she. This is probably related to ACE inhibitor therapy. We will discontinue the Ace and start losartan 100 mg per day. Basic metabolic panel in 2-3 weeks when she has an appointment in the hypertension clinic to ensure that we have continued blood pressure control. 2. No evidence of volume overload. Echo and recent myocardial perfusion imaging demonstrated LV function is now back in the normal range with EF of 60%. Continue current medical regimen although we are switching from an ACE to an ARB. 3. As already noted, no indication changes being made. 4. The patient does likely have coronary obstructive disease. The nuclear study shows evidence of inferior ischemia. This distribution previously have wall motion abnormality 3 years ago. That has now improved. In absence of symptoms, no further evaluation is indicated. It is possible that there is total occlusion of either the right or distal circumflex with collaterals. Risk factor modification and daily aspirin is recommended 5. [facility] in 4 months. Blood pressure clinic in 2-4 weeks to assure continued blood pressure control after switching ACE--> ARB. A be met will be done at that time.  Medication Adjustments/Labs and Tests Ordered: Current medicines are reviewed at length with the patient today.  Concerns regarding medicines are outlined  above.  Medication changes, Labs and Tests ordered today are listed in the Patient Instructions below. Patient Instructions  Medication Instructions:  1) STOP Lisinopril 2) START Losartan 10048mnce daily  Labwork: Your physician recommends that you return for lab work at time of your Hypertension Clinic appointment.   Testing/Procedures: None  Follow-Up: Your physician recommends that you schedule a follow-up appointment in: 2-3 weeks with Hypertension Clinic.  Your physician recommends that you schedule a follow-up appointment  in: 4 months with Dr. Tamala Julian.     Any Other Special Instructions Will Be Listed Below (If Applicable).     If you need a refill on your cardiac medications before your next appointment, please call your pharmacy.      Signed, Sinclair Grooms, MD  12/16/2016 11:31 AM    [facility] [place], [place], [place]  [address] Phone: [phone]; Fax: [phone]

## 2016-12-16 ENCOUNTER — Ambulatory Visit (INDEPENDENT_AMBULATORY_CARE_PROVIDER_SITE_OTHER): Payer: No Typology Code available for payment source | Admitting: Interventional Cardiology

## 2016-12-16 ENCOUNTER — Encounter (INDEPENDENT_AMBULATORY_CARE_PROVIDER_SITE_OTHER): Payer: Self-pay

## 2016-12-16 ENCOUNTER — Encounter: Payer: Self-pay | Admitting: Interventional Cardiology

## 2016-12-16 VITALS — BP 120/66 | HR 80 | Ht 63.0 in | Wt 171.6 lb

## 2016-12-16 DIAGNOSIS — I10 Essential (primary) hypertension: Secondary | ICD-10-CM

## 2016-12-16 DIAGNOSIS — E1165 Type 2 diabetes mellitus with hyperglycemia: Secondary | ICD-10-CM

## 2016-12-16 DIAGNOSIS — R05 Cough: Secondary | ICD-10-CM

## 2016-12-16 DIAGNOSIS — IMO0002 Reserved for concepts with insufficient information to code with codable children: Secondary | ICD-10-CM

## 2016-12-16 DIAGNOSIS — I779 Disorder of arteries and arterioles, unspecified: Secondary | ICD-10-CM

## 2016-12-16 DIAGNOSIS — I5042 Chronic combined systolic (congestive) and diastolic (congestive) heart failure: Secondary | ICD-10-CM

## 2016-12-16 DIAGNOSIS — I739 Peripheral vascular disease, unspecified: Secondary | ICD-10-CM

## 2016-12-16 DIAGNOSIS — I2510 Atherosclerotic heart disease of native coronary artery without angina pectoris: Secondary | ICD-10-CM

## 2016-12-16 DIAGNOSIS — E1151 Type 2 diabetes mellitus with diabetic peripheral angiopathy without gangrene: Secondary | ICD-10-CM

## 2016-12-16 DIAGNOSIS — T464X5A Adverse effect of angiotensin-converting-enzyme inhibitors, initial encounter: Secondary | ICD-10-CM

## 2016-12-16 MED ORDER — LOSARTAN POTASSIUM 100 MG PO TABS: 100.0000 mg | ORAL_TABLET | Freq: Every day | ORAL | 3 refills | Status: DC

## 2016-12-16 MED FILL — LOSARTAN POTASSIUM 100 MG T: 100 | 30 days supply | Qty: 30 | Fill #0

## 2016-12-16 NOTE — Patient Instructions (Signed)
Medication Instructions:  1) STOP Lisinopril 2) START Losartan 100mg  once daily  Labwork: Your physician recommends that you return for lab work at time of your Hypertension Clinic appointment.   Testing/Procedures: None  Follow-Up: Your physician recommends that you schedule a follow-up appointment in: 2-3 weeks with Hypertension Clinic.  Your physician recommends that you schedule a follow-up appointment in: 4 months with Dr. Tamala Julian.     Any Other Special Instructions Will Be Listed Below (If Applicable).     If you need a refill on your cardiac medications before your next appointment, please call your pharmacy.

## 2016-12-19 ENCOUNTER — Ambulatory Visit: Payer: No Typology Code available for payment source | Admitting: Physical Therapy

## 2016-12-19 DIAGNOSIS — G8929 Other chronic pain: Secondary | ICD-10-CM

## 2016-12-19 DIAGNOSIS — M25552 Pain in left hip: Secondary | ICD-10-CM

## 2016-12-19 DIAGNOSIS — M545 Low back pain, unspecified: Secondary | ICD-10-CM

## 2016-12-19 DIAGNOSIS — M25551 Pain in right hip: Secondary | ICD-10-CM

## 2016-12-19 DIAGNOSIS — R262 Difficulty in walking, not elsewhere classified: Secondary | ICD-10-CM

## 2016-12-19 DIAGNOSIS — M25651 Stiffness of right hip, not elsewhere classified: Secondary | ICD-10-CM

## 2016-12-19 NOTE — Therapy (Signed)
[place], [place], [address] Phone: [phone]   Fax:  [phone]  Physical Therapy Treatment  Patient Details  Name: [name] MRN: [id number] Date of Birth: 02/12/1956 Referring Provider: Dr Georga Kaufmann   Encounter Date: 12/19/2016      PT End of Session - 12/19/16 1022    Visit Number 10   Number of Visits 16   Date for PT Re-Evaluation 12/22/16   Authorization Type CAFA    PT Start Time 1015   PT Stop Time 1100   PT Time Calculation (min) 45 min      Past Medical History:  Diagnosis Date  . Arthritis   . CHF (congestive heart failure) ([place])    2014...@ [facility]  . Diabetes mellitus    diagnosed 2001  . Hypertension   . PVD (peripheral vascular disease) ([place])     Past Surgical History:  Procedure Laterality Date  . ABDOMINAL ANGIOGRAM  12/28/2012  . ABDOMINAL ANGIOGRAM N/A 12/29/2011   Procedure: ABDOMINAL ANGIOGRAM;  Surgeon: Conrad [place], MD;  Location: [facility] CATH LAB;  Service: Cardiovascular;  Laterality: N/A;  . ABDOMINAL AORTAGRAM N/A 12/21/2012   Procedure: ABDOMINAL Maxcine Ham;  Surgeon: Elam Dutch, MD;  Location: [facility] CATH LAB;  Service: Cardiovascular;  Laterality: N/A;  . TOTAL HIP ARTHROPLASTY Left 08/19/2013   Procedure: TOTAL HIP ARTHROPLASTY;  Surgeon: Kerin Salen, MD;  Location: [facility];  Service: Orthopedics;  Laterality: Left;  . TUBAL LIGATION      There were no vitals filed for this visit.      Subjective Assessment - 12/19/16 1020    Subjective Back is much better with the walker.    Currently in Pain? Yes   Pain Score 0-No pain   Pain Location Back   Pain Score 6   Pain Location Hip   Pain Orientation Right   Pain Descriptors / Indicators Sore   Aggravating Factors  everytime I move    Pain Relieving Factors exercises, laying down             [facility] PT Assessment - 12/19/16 0001      Observation/Other Assessments   Focus on Therapeutic Outcomes  (FOTO)  NT     AROM   Overall AROM Comments c/o pain with right hip flexion AROM    Right/Left Hip Right;Left   Right Hip Flexion 80  85 PROM   Left Hip Flexion 92  95 PROM                              [facility] Adult PT Treatment/Exercise - 12/19/16 0001      Lumbar Exercises: Stretches   Pelvic Tilt Limitations 10 x 5 sec hold good technique noted.      Lumbar Exercises: Supine   Clam 20 reps   Clam Limitations red band an abdominal draw in    Bent Knee Raise 20 reps   Bent Knee Raise Limitations with abdominal draw in   Bridge Limitations glute set with mini bridge x 10    Straight Leg Raise 10 reps   Straight Leg Raises Limitations both   Other Supine Lumbar Exercises ball squueze with abdominal draw in x 10      Knee/Hip Exercises: Aerobic   Nustep 6 minutes     Knee/Hip Exercises: Seated   Sit to Sand without UE support;10 reps  2 sets  Knee/Hip Exercises: Sidelying   Clams x 20 each leg , reverse x 20                   PT Short Term Goals - 12/19/16 1037      PT SHORT TERM GOAL #1   Title Patient will demsotrate a good core contraction    Baseline with PPT    Time 4   Period Weeks   Status Achieved     PT SHORT TERM GOAL #2   Baseline improved from 50 to 80 supine   Time 4   Period Weeks   Status Achieved     PT SHORT TERM GOAL #3   Title Patient will demsotrate 4/5 gross bilateral lower extremity strength    Baseline hip abd 3/5 - 3+/5 and flex 4-/5   Time 4   Period Weeks   Status Partially Met     PT SHORT TERM GOAL #4   Title Patient will be independent with initial HEP    Baseline independent with exercises so far 3 x per week    Time 4   Period Weeks   Status Achieved           PT Long Term Goals - 12/19/16 1040      PT LONG TERM GOAL #1   Title Patient will transfer sit to stand without hands and without pain    Baseline a little pain, able to rise without hands, with difficulty   Time 8   Period  Weeks   Status Partially Met     PT LONG TERM GOAL #2   Title Patient will ambualte 3000' with LRAD without pain    Time 8   Period Weeks   Status Unable to assess     PT LONG TERM GOAL #3   Title Patient will stand for 30 minutes without pain    Baseline 8 minutes   Time 8   Period Weeks   Status On-going               Plan - 12/19/16 1026    Clinical Impression Statement Pt reports standing tolerance 8 minutes. She is able to rise from mat table without UE but has some difficulty. She notes a decrease in back pain since having RW and reports 0/10 back pain today. Rifght hip AROM improved. STG#2 met. She was able to rise from mat table without UE support however has some pain in hip. LTG# 1 partially met. SHe is interested in continuing Physical Therapy and will see primary PT next visit to discuss renewal of POC.    PT Next Visit Plan Continue to work on exercises to stregthen bilateral lower extremitys. Continue to work on hip mobility. Continue walker training. .    PT Home Exercise Plan single knee to chest with towel; lateral trunk rotation; quad sets, glut sets; LAQ clams, tilt, Transverse abdominal contraction with clams and bent knee raises.    Consulted and Agree with Plan of Care Patient;Family member/caregiver   Family Member Consulted Daughter      Patient will benefit from skilled therapeutic intervention in order to improve the following deficits and impairments:  Abnormal gait, Decreased range of motion, Difficulty walking, Increased muscle spasms, Decreased endurance, Decreased activity tolerance, Pain, Decreased strength, Postural dysfunction  Visit Diagnosis: Pain in right hip  Pain in left hip  Chronic left-sided low back pain without sciatica  Stiffness of right hip, not elsewhere classified  Difficulty in walking, not  elsewhere classified     Problem List Patient Active Problem List   Diagnosis Date Noted  . Bilateral carotid artery disease  ([place]) 11/10/2016  . Left flank pain 10/11/2016  . Seasonal allergies 06/30/2016  . Skin lesion of foot 03/08/2016  . Financial difficulties 03/08/2016  . Healthcare maintenance 06/16/2015  . Nonproliferative diabetic retinopathy associated with type 2 diabetes mellitus ([place]) 06/15/2015  . Osteoarthritis of right hip 06/09/2015  . Arthritis of left hip 08/18/2013  . DM (diabetes mellitus) type II uncontrolled, periph vascular disorder ([place]) 07/17/2013  . Anemia 07/17/2013  . Chronic combined systolic and diastolic CHF, NYHA class 2 ([place]) 07/07/2013  . CKD (chronic kidney disease) 11/22/2012  . HTN (hypertension) 11/08/2012  . Atherosclerosis of native arteries of the extremities with ulceration ([place]) 08/16/2012    Dorene Ar, PTA 12/19/2016, 1:27 PM  [facility] [address] [place], [place], [address] Phone: [phone]   Fax:  [phone]  Name: [name] MRN: [id number] Date of Birth: 1955/12/01

## 2016-12-21 ENCOUNTER — Encounter: Payer: Self-pay | Admitting: Physical Therapy

## 2016-12-21 ENCOUNTER — Ambulatory Visit: Payer: No Typology Code available for payment source | Admitting: Physical Therapy

## 2016-12-21 DIAGNOSIS — R262 Difficulty in walking, not elsewhere classified: Secondary | ICD-10-CM

## 2016-12-21 DIAGNOSIS — M25552 Pain in left hip: Secondary | ICD-10-CM

## 2016-12-21 DIAGNOSIS — M545 Low back pain, unspecified: Secondary | ICD-10-CM

## 2016-12-21 DIAGNOSIS — M25651 Stiffness of right hip, not elsewhere classified: Secondary | ICD-10-CM

## 2016-12-21 DIAGNOSIS — G8929 Other chronic pain: Secondary | ICD-10-CM

## 2016-12-21 DIAGNOSIS — M25551 Pain in right hip: Secondary | ICD-10-CM

## 2016-12-21 NOTE — Therapy (Signed)
[place], [place], [address] Phone: [phone]   Fax:  [phone]  Physical Therapy Treatment  Patient Details  Name: [name] MRN: [id number] Date of Birth: February 16, 1956 Referring Provider: Dr Georga Kaufmann   Encounter Date: 12/21/2016      PT End of Session - 12/21/16 1017    Visit Number 11   Number of Visits 16   Date for PT Re-Evaluation 01/18/17   Authorization Type CAFA    PT Start Time 1015   PT Stop Time 1055   PT Time Calculation (min) 40 min   Activity Tolerance Patient tolerated treatment well   Behavior During Therapy [facility] for tasks assessed/performed      Past Medical History:  Diagnosis Date  . Arthritis   . CHF (congestive heart failure) ([place])    2014...@ [facility]  . Diabetes mellitus    diagnosed 2001  . Hypertension   . PVD (peripheral vascular disease) ([place])     Past Surgical History:  Procedure Laterality Date  . ABDOMINAL ANGIOGRAM  12/28/2012  . ABDOMINAL ANGIOGRAM N/A 12/29/2011   Procedure: ABDOMINAL ANGIOGRAM;  Surgeon: Conrad Northmoor, MD;  Location: [facility] CATH LAB;  Service: Cardiovascular;  Laterality: N/A;  . ABDOMINAL AORTAGRAM N/A 12/21/2012   Procedure: ABDOMINAL Maxcine Ham;  Surgeon: Elam Dutch, MD;  Location: [facility] CATH LAB;  Service: Cardiovascular;  Laterality: N/A;  . TOTAL HIP ARTHROPLASTY Left 08/19/2013   Procedure: TOTAL HIP ARTHROPLASTY;  Surgeon: Kerin Salen, MD;  Location: [facility];  Service: Orthopedics;  Laterality: Left;  . TUBAL LIGATION      There were no vitals filed for this visit.      Subjective Assessment - 12/21/16 1019    Subjective Patient feels like she can take some weight off her legs using the walker. She has been able to walk further without pain.    Pertinent History Left total hip replacement, DMII, CHF,    Limitations Standing;Walking;Lifting   Currently in Pain? Yes   Pain Score 0-No pain   Pain Location Back   Pain  Descriptors / Indicators Aching   Pain Onset More than a month ago            [facility] PT Assessment - 12/21/16 0001      Observation/Other Assessments   Focus on Therapeutic Outcomes (FOTO)  NT     AROM   Overall AROM Comments c/o pain with right hip flexion AROM    Right Hip Flexion 80  85 PROM   Left Hip Flexion 92  95 PROM                     [facility] Adult PT Treatment/Exercise - 12/21/16 0001      Lumbar Exercises: Stretches   Lower Trunk Rotation Limitations x10    Pelvic Tilt Limitations 10 x 5 sec hold good technique noted.      Lumbar Exercises: Supine   Clam 20 reps   Clam Limitations red band an abdominal draw in    Bent Knee Raise 20 reps   Bent Knee Raise Limitations with abdominal draw in   Other Supine Lumbar Exercises ball squeeze with abdominal draw in x 10      Knee/Hip Exercises: Aerobic   Nustep 5 minutes     Knee/Hip Exercises: Sidelying   Clams x 20 each leg , reverse x 20  PT Education - 12/21/16 1041    Education provided Yes   Education Details reviewed HEP and technique with exercises    Person(s) Educated Patient;Child(ren)   Methods Explanation;Demonstration;Verbal cues   Comprehension Verbalized understanding;Verbal cues required;Returned demonstration          PT Short Term Goals - 12/21/16 1526      PT SHORT TERM GOAL #1   Title Patient will demsotrate a good core contraction    Baseline with PPT    Time 4   Period Weeks   Status Achieved     PT SHORT TERM GOAL #2   Title Patient will increase right and left hip flexion by 20 degrees bilateral   Baseline improved from 50 to 80 supine   Time 4   Period Weeks   Status Achieved     PT SHORT TERM GOAL #3   Title Patient will demsotrate 4/5 gross bilateral lower extremity strength    Baseline hip abd 3/5 - 3+/5 and flex 4-/5   Time 4   Period Weeks   Status Partially Met     PT SHORT TERM GOAL #4   Title Patient will be independent  with initial HEP    Baseline independent with exercises so far 3 x per week    Time 4   Period Weeks   Status Achieved           PT Long Term Goals - 12/21/16 1526      PT LONG TERM GOAL #1   Title Patient will transfer sit to stand without hands and without pain    Baseline a little pain, able to rise without hands, with difficulty   Time 8   Status Partially Met     PT LONG TERM GOAL #2   Title Patient will ambualte 3000' with LRAD without pain    Baseline walking with minimal pain with walker    Time 8   Period Weeks   Status On-going     PT LONG TERM GOAL #3   Title Patient will stand for 30 minutes without pain    Baseline 15   Time 8   Period Weeks   Status On-going               Plan - 12/21/16 1403    Clinical Impression Statement Patient has made good progress considering how much pain she was in when she first came in. She is walking with a walker and that seems to be giving therapy a good window to strengthen without significant pain. She had no pain today.    Clinical Presentation Evolving   Clinical Decision Making Moderate   Rehab Potential Good   PT Frequency 2x / week   PT Duration 8 weeks   PT Treatment/Interventions ADLs/Self Care Home Management;Gait training;Stair training;Iontophoresis 27m/ml Dexamethasone;Electrical Stimulation;Cryotherapy;Moist Heat;Ultrasound;Therapeutic activities;Therapeutic exercise;Patient/family education;Neuromuscular re-education;Splinting;Taping;Manual techniques;Passive range of motion   PT Next Visit Plan Continue to work on exercises to stregthen bilateral lower extremitys. Continue to work on hip mobility. Continue walker training. .    PT Home Exercise Plan single knee to chest with towel; lateral trunk rotation; quad sets, glut sets; LAQ clams, tilt, Transverse abdominal contraction with clams and bent knee raises.    Consulted and Agree with Plan of Care Patient   Family Member Consulted Daughter       Patient will benefit from skilled therapeutic intervention in order to improve the following deficits and impairments:  Abnormal gait, Decreased range of motion, Difficulty  walking, Increased muscle spasms, Decreased endurance, Decreased activity tolerance, Pain, Decreased strength, Postural dysfunction  Visit Diagnosis: Pain in right hip - Plan: PT plan of care cert/re-cert  Pain in left hip - Plan: PT plan of care cert/re-cert  Chronic left-sided low back pain without sciatica - Plan: PT plan of care cert/re-cert  Stiffness of right hip, not elsewhere classified - Plan: PT plan of care cert/re-cert  Difficulty in walking, not elsewhere classified - Plan: PT plan of care cert/re-cert     Problem List Patient Active Problem List   Diagnosis Date Noted  . Bilateral carotid artery disease ([place]) 11/10/2016  . Left flank pain 10/11/2016  . Seasonal allergies 06/30/2016  . Skin lesion of foot 03/08/2016  . Financial difficulties 03/08/2016  . Healthcare maintenance 06/16/2015  . Nonproliferative diabetic retinopathy associated with type 2 diabetes mellitus ([place]) 06/15/2015  . Osteoarthritis of right hip 06/09/2015  . Arthritis of left hip 08/18/2013  . DM (diabetes mellitus) type II uncontrolled, periph vascular disorder ([place]) 07/17/2013  . Anemia 07/17/2013  . Chronic combined systolic and diastolic CHF, NYHA class 2 ([place]) 07/07/2013  . CKD (chronic kidney disease) 11/22/2012  . HTN (hypertension) 11/08/2012  . Atherosclerosis of native arteries of the extremities with ulceration ([place]) 08/16/2012    Carney Living PT DPT  12/21/2016, 3:31 PM  [facility] [address] [place], [place], [address] Phone: [phone]   Fax:  [phone]  Name: CHALESE PEACH MRN: [id number] Date of Birth: Sep 07, 1955

## 2016-12-26 ENCOUNTER — Ambulatory Visit: Payer: No Typology Code available for payment source | Admitting: Physical Therapy

## 2016-12-26 MED FILL — JANUMET 50-1,000 MG TABLET: 50-1000 | 30 days supply | Qty: 60 | Fill #2

## 2016-12-28 ENCOUNTER — Encounter: Payer: No Typology Code available for payment source | Admitting: Physical Therapy

## 2016-12-28 ENCOUNTER — Ambulatory Visit: Payer: No Typology Code available for payment source | Admitting: Physical Therapy

## 2016-12-28 DIAGNOSIS — M25651 Stiffness of right hip, not elsewhere classified: Secondary | ICD-10-CM

## 2016-12-28 DIAGNOSIS — R262 Difficulty in walking, not elsewhere classified: Secondary | ICD-10-CM

## 2016-12-28 DIAGNOSIS — M545 Low back pain: Secondary | ICD-10-CM

## 2016-12-28 DIAGNOSIS — G8929 Other chronic pain: Secondary | ICD-10-CM

## 2016-12-28 DIAGNOSIS — M25551 Pain in right hip: Secondary | ICD-10-CM

## 2016-12-28 DIAGNOSIS — M25552 Pain in left hip: Secondary | ICD-10-CM

## 2016-12-29 NOTE — Therapy (Addendum)
[facility] [address] [place], [place], [address] Phone: [phone]   Fax:  [phone]  Physical Therapy Treatment  Patient Details  Name: [name] MRN: [id number] Date of Birth: 12/19/1955 Referring Provider: Dr Dian Truong   Encounter Date: 12/28/2016      PT End of Session - 12/29/16 0913    Visit Number 12   Number of Visits 16   Date for PT Re-Evaluation 01/18/17   Authorization Type CAFA    PT Start Time 1632   PT Stop Time 1713   PT Time Calculation (min) 41 min   Activity Tolerance Patient tolerated treatment well   Behavior During Therapy [facility] for tasks assessed/performed      Past Medical History:  Diagnosis Date  . Arthritis   . CHF (congestive heart failure) ([facility])    2014...@ [facility]  . Diabetes mellitus    diagnosed 2001  . Hypertension   . PVD (peripheral vascular disease) ([facility])     Past Surgical History:  Procedure Laterality Date  . ABDOMINAL ANGIOGRAM  12/28/2012  . ABDOMINAL ANGIOGRAM N/A 12/29/2011   Procedure: ABDOMINAL ANGIOGRAM;  Surgeon: Brian L Chen, MD;  Location: [facility] CATH LAB;  Service: Cardiovascular;  Laterality: N/A;  . ABDOMINAL AORTAGRAM N/A 12/21/2012   Procedure: ABDOMINAL AORTAGRAM;  Surgeon: Charles E Fields, MD;  Location: [facility] CATH LAB;  Service: Cardiovascular;  Laterality: N/A;  . TOTAL HIP ARTHROPLASTY Left 08/19/2013   Procedure: TOTAL HIP ARTHROPLASTY;  Surgeon: Frank J Rowan, MD;  Location: [facility] OR;  Service: Orthopedics;  Laterality: Left;  . TUBAL LIGATION      There were no vitals filed for this visit.      Subjective Assessment - 12/29/16 0903    Subjective Patient comes in today with her cane. She reports she is feleing so good that she has not had touse her walker today. Minimal pain in her right hip today and no pain right now. Her Cafa will expore on 9/8. She was advised to apply for more if she can.    Limitations Standing;Walking;Lifting   Currently in  Pain? Yes   Pain Score 0-No pain                         [facility] Adult PT Treatment/Exercise - 12/29/16 0001      Lumbar Exercises: Stretches   Pelvic Tilt Limitations 10 x 5 sec hold good technique noted.      Lumbar Exercises: Supine   Clam 20 reps   Clam Limitations red band an abdominal draw in    Bent Knee Raise 20 reps   Bent Knee Raise Limitations with abdominal draw in   Bridge Limitations glute set with mini bridge x 10    Straight Leg Raise 10 reps   Straight Leg Raises Limitations both   Other Supine Lumbar Exercises ball squeeze with abdominal draw in x 10      Knee/Hip Exercises: Aerobic   Nustep 5 minutes     Knee/Hip Exercises: Sidelying   Clams x 20 each leg , reverse x 20      Manual Therapy   Manual therapy comments PROM right hip flexion, Ir, Er; posterior hip mobilization and  LAD to improve knee mobility. Soft tissue mobilization to the lumbar spine; Gluteal roll out.                 PT Education - 12/29/16 0911    Education provided Yes     Education Details reviewed HEP    Person(s) Educated Patient;Child(ren);Spouse   Methods Explanation;Demonstration   Comprehension Verbalized understanding;Returned demonstration;Verbal cues required          PT Short Term Goals - 12/21/16 1526      PT SHORT TERM GOAL #1   Title Patient will demsotrate a good core contraction    Baseline with PPT    Time 4   Period Weeks   Status Achieved     PT SHORT TERM GOAL #2   Title Patient will increase right and left hip flexion by 20 degrees bilateral   Baseline improved from 50 to 80 supine   Time 4   Period Weeks   Status Achieved     PT SHORT TERM GOAL #3   Title Patient will demsotrate 4/5 gross bilateral lower extremity strength    Baseline hip abd 3/5 - 3+/5 and flex 4-/5   Time 4   Period Weeks   Status Partially Met     PT SHORT TERM GOAL #4   Title Patient will be independent with initial HEP    Baseline independent with  exercises so far 3 x per week    Time 4   Period Weeks   Status Achieved           PT Long Term Goals - 12/21/16 1526      PT LONG TERM GOAL #1   Title Patient will transfer sit to stand without hands and without pain    Baseline a little pain, able to rise without hands, with difficulty   Time 8   Status Partially Met     PT LONG TERM GOAL #2   Title Patient will ambualte 3000' with LRAD without pain    Baseline walking with minimal pain with walker    Time 8   Period Weeks   Status On-going     PT LONG TERM GOAL #3   Title Patient will stand for 30 minutes without pain    Baseline 15   Time 8   Period Weeks   Status On-going               Plan - 12/29/16 0913    Clinical Impression Statement Patient will have 1 more visit beofre her CAFA expires. She was advised if she hopes to continue to make sure she fills out the paper work. She is making good progress. She had no pain with exercises today. She is back to walking with the cane but was advised if she needs to to use the walker. Therapy will continue with manual therapy to decrease compression in her hip and back. She has severe arthritis in her right hip. She is likely nearing max potential but may benefit from 2-3 more weeks of therapy.    Clinical Presentation Evolving   Clinical Decision Making Moderate   PT Frequency 2x / week   PT Duration 8 weeks   PT Treatment/Interventions ADLs/Self Care Home Management;Gait training;Stair training;Iontophoresis 76m/ml Dexamethasone;Electrical Stimulation;Cryotherapy;Moist Heat;Ultrasound;Therapeutic activities;Therapeutic exercise;Patient/family education;Neuromuscular re-education;Splinting;Taping;Manual techniques;Passive range of motion   PT Next Visit Plan Continue to work on exercises to stregthen bilateral lower extremitys. Continue to work on hip mobility. Continue walker training. .    PT Home Exercise Plan single knee to chest with towel; lateral trunk rotation;  quad sets, glut sets; LAQ clams, tilt, Transverse abdominal contraction with clams and bent knee raises.    Consulted and Agree with Plan of Care Patient   Family Member Consulted Daughter  Patient will benefit from skilled therapeutic intervention in order to improve the following deficits and impairments:  Abnormal gait, Decreased range of motion, Difficulty walking, Increased muscle spasms, Decreased endurance, Decreased activity tolerance, Pain, Decreased strength, Postural dysfunction  Visit Diagnosis: Pain in right hip  Pain in left hip  Chronic left-sided low back pain without sciatica  Stiffness of right hip, not elsewhere classified  Difficulty in walking, not elsewhere classified     Problem List Patient Active Problem List   Diagnosis Date Noted  . Bilateral carotid artery disease ([facility]) 11/10/2016  . Left flank pain 10/11/2016  . Seasonal allergies 06/30/2016  . Skin lesion of foot 03/08/2016  . Financial difficulties 03/08/2016  . Healthcare maintenance 06/16/2015  . Nonproliferative diabetic retinopathy associated with type 2 diabetes mellitus ([facility]) 06/15/2015  . Osteoarthritis of right hip 06/09/2015  . Arthritis of left hip 08/18/2013  . DM (diabetes mellitus) type II uncontrolled, periph vascular disorder ([facility]) 07/17/2013  . Anemia 07/17/2013  . Chronic combined systolic and diastolic CHF, NYHA class 2 ([facility]) 07/07/2013  . CKD (chronic kidney disease) 11/22/2012  . HTN (hypertension) 11/08/2012  . Atherosclerosis of native arteries of the extremities with ulceration ([facility]) 08/16/2012     J  PT DPT 12/29/2016, 10:23 AM  [facility] [address] [place], [place], [address] Phone: [phone]   Fax:  [phone]  Name: Joslynne L Hertenstein MRN: [id number] Date of Birth: 11/08/1955   

## 2017-01-03 ENCOUNTER — Encounter: Payer: No Typology Code available for payment source | Admitting: Physical Therapy

## 2017-01-05 ENCOUNTER — Ambulatory Visit: Payer: Self-pay | Attending: Internal Medicine | Admitting: Physical Therapy

## 2017-01-05 ENCOUNTER — Ambulatory Visit (INDEPENDENT_AMBULATORY_CARE_PROVIDER_SITE_OTHER): Payer: No Typology Code available for payment source | Admitting: Pharmacist

## 2017-01-05 ENCOUNTER — Encounter: Payer: No Typology Code available for payment source | Admitting: Physical Therapy

## 2017-01-05 ENCOUNTER — Other Ambulatory Visit: Payer: No Typology Code available for payment source | Admitting: *Deleted

## 2017-01-05 VITALS — BP 120/68 | Wt 169.0 lb

## 2017-01-05 DIAGNOSIS — I5042 Chronic combined systolic (congestive) and diastolic (congestive) heart failure: Secondary | ICD-10-CM

## 2017-01-05 DIAGNOSIS — I10 Essential (primary) hypertension: Secondary | ICD-10-CM

## 2017-01-05 LAB — BASIC METABOLIC PANEL
BUN/Creatinine Ratio: 16 (ref 9–23)
BUN: 20 mg/dL (ref 6–24)
CALCIUM: 9.2 mg/dL (ref 8.7–10.2)
CO2: 20 mmol/L (ref 20–29)
CREATININE: 1.23 mg/dL — AB (ref 0.57–1.00)
Chloride: 106 mmol/L (ref 96–106)
GFR calc Af Amer: 55 mL/min/{1.73_m2} — ABNORMAL LOW (ref >59)
GFR calc non Af Amer: 48 mL/min/{1.73_m2} — ABNORMAL LOW (ref >59)
GLUCOSE: 127 mg/dL — AB (ref 65–99)
POTASSIUM: 4.2 mmol/L (ref 3.5–5.2)
SODIUM: 144 mmol/L (ref 134–144)

## 2017-01-05 MED FILL — CARVEDILOL 12.5 MG TABLET: 12.5 | 30 days supply | Qty: 60 | Fill #5

## 2017-01-05 NOTE — Progress Notes (Signed)
Patient ID: [name]                 DOB: [birth year]                      MRN: [id number]      HPI: [name] is a 61 y.o. female referred by Dr. Tamala Julian to HTN clinic. PMH is significant for chronic diastolic heart failure, PAD with claudication, DM, CAD, and essential hypertension.   Recent stress nuclear study revealed a moderate area of ischemia in the distribution of a posterolateral ventricular branch of the RCA or LCx and normal left ventricular regional and global systolic function. 11/24/16 Echo demonstrates LV improvement and normalization of >/= 60.   At her last OV with Dr. Tamala Julian, her blood pressure was well-controlled at 120/66 mmHg. At this time, patient developed a dry cough after initiating lisinopril earlier this year. Lisinopril was discontinued and Losartan 100 mg was initiated. BMET scheduled for today at HTN clinic.  --- Today in clinic she has no specific complaints. She states that her cough has almost completely subsided, and is improving everyday. She is able to sleep better at night and the itching in her throat has subsided. She denies dizziness, headaches, or falls/unsteady gait since the medication change.  Current HTN meds:  Amlodipine 10 mg daily Carvedilol 12.43m twice daily   Losartan 100 mg daily  Previously tried: lisinopril 40 mg daily (cough)  BP goal: <130/80 mmHg  Family History: The patient's family history includes Diabetes in her grandmother (Dad's mother). Mother significant for epilepsy. Father had hypertension.  Social History: Married, Social smoker in 20's, never smokeless tobacco user.  Diet: Very rarely adds salt to her foods, states she adds spices to her foods instead of salt. Has stopped drinking coffee within the last 6 months. Lean meats (baked chicken), fish, and vegetables (green beans, zucchini, squash), white rice. Very rarely eats fast food.  Exercise: Limited with right hip pain, attends physical therapy 1-2x per week  (left hip replacement, with plans to replace right hip when get coverage)  Home BP readings: Does not take blood pressure at home due to not having a cuff.  Wt Readings from Last 3 Encounters:  12/16/16 171 lb 9.6 oz (77.8 kg)  11/24/16 172 lb (78 kg)  11/10/16 172 lb 3.2 oz (78.1 kg)   BP Readings from Last 3 Encounters:  12/16/16 120/66  11/10/16 126/72  10/11/16 133/67   Pulse Readings from Last 3 Encounters:  12/16/16 80  11/10/16 82  10/11/16 83    Renal function: CrCl cannot be calculated (Patient's most recent lab result is older than the maximum 21 days allowed.).  Past Medical History:  Diagnosis Date  . Arthritis   . [place] ([place]) [place]    2014...@ [facility]  . Diabetes mellitus    diagnosed 2001  . Hypertension   . PVD (peripheral vascular disease) [place]     Current Outpatient Prescriptions on File Prior to Visit  Medication Sig Dispense Refill  . amLODipine (NORVASC) 10 MG tablet Take 1 tablet (10 mg total) by mouth daily. 90 tablet 1  . aspirin EC 81 MG tablet Take 1 tablet (81 mg total) by mouth daily. IM program, hope fund 30 tablet 11  . atorvastatin (LIPITOR) 40 MG tablet Take 40 mg by mouth daily.  6  . BAYER MICROLET LANCETS lancets Check blood sugar three times a day. ICD 10: E11.51 100 each 12  .  blood glucose meter kit and supplies Check blood sugar two times a day 1 each 0  . Blood Glucose Monitoring Suppl (CONTOUR NEXT EZ MONITOR) w/Device KIT Check blood sugar three times a day. ICD 10: E11.51 1 kit 0  . carvedilol (COREG) 12.5 MG tablet Take 1 tablet (12.5 mg total) by mouth 2 (two) times daily with a meal. IM program, hope fund 60 tablet 6  . glucose blood (BAYER CONTOUR NEXT TEST) test strip Check blood sugar three times a day. ICD 10: E11.51 100 each 12  . insulin detemir (LEVEMIR) 100 UNIT/ML injection Inject 0.2 mLs (20 Units total) into the skin at bedtime. IM program, hope fund 10 mL 11  . Insulin Syringe-Needle U-100 31G X  15/64" 0.3 ML MISC Use to take insulin one time a day 100 each 12  . loratadine (CLARITIN) 10 MG tablet Take 1 tablet (10 mg total) by mouth daily. (Patient taking differently: Take 10 mg by mouth daily as needed for allergies. ) 30 tablet 2  . losartan (COZAAR) 100 MG tablet Take 1 tablet (100 mg total) by mouth daily. 90 tablet 3  . sitaGLIPtin-metformin (JANUMET) 50-1000 MG tablet Take 1 tablet by mouth 2 (two) times daily with a meal. IM program (Patient taking differently: Take 2 tablets by mouth 2 (two) times daily with a meal. IM program) 60 tablet 11  . triamcinolone cream (KENALOG) 0.1 % Apply 1 application topically daily as needed for irritation.  1   No current facility-administered medications on file prior to visit.     Allergies  Allergen Reactions  . Plavix [Clopidogrel Bisulfate] Rash     Assessment/Plan: Hypertension: Blood pressure of 120/68 mmHg is at goal of </= 130/80 mmHg today in HTN Clinic. She is tolerating the Losartan 100 mg well and her cough has subsided. With no complaints and no issues with compliance, will continue current regimen.   Patient was encouraged to continue limiting her salt intake, and to continue going to physical therapy throughout the week. She will continue to follow-up with Dr. Tamala Julian.    L. Kyung Rudd, PharmD, [place] PGY1 Pharmacy Resident [place]

## 2017-01-05 NOTE — Patient Instructions (Addendum)
It was nice to meet you today. Your blood pressure today was 120/68 mmHg and is at goal of less than or equal to 130/80 mmHg. Please continue your current mediations.  Continue to watch your salt intake in your diet and continue your exercise with physical therapy. Follow-up with Dr. Tamala Julian and HTN Clinic as needed.

## 2017-01-06 NOTE — Progress Notes (Signed)
Patient was seen with [name], [facility] resident and I agree with the above assessment and plan.   Thank you, Lelan Pons. [place], [place]  [address] [place],  [address]  Phone: [phone]; Fax: [phone] 01/06/2017 7:37 AM

## 2017-01-09 ENCOUNTER — Encounter: Payer: No Typology Code available for payment source | Admitting: Physical Therapy

## 2017-01-10 ENCOUNTER — Encounter: Payer: No Typology Code available for payment source | Admitting: Physical Therapy

## 2017-01-10 MED FILL — LEVEMIR 100 UNITS/ML VIAL: 100 | 30 days supply | Qty: 10 | Fill #4

## 2017-01-11 ENCOUNTER — Encounter: Payer: No Typology Code available for payment source | Admitting: Physical Therapy

## 2017-01-12 ENCOUNTER — Ambulatory Visit: Payer: Self-pay | Admitting: Physical Therapy

## 2017-01-16 ENCOUNTER — Encounter: Payer: No Typology Code available for payment source | Admitting: Physical Therapy

## 2017-01-17 ENCOUNTER — Encounter: Payer: No Typology Code available for payment source | Admitting: Physical Therapy

## 2017-01-18 ENCOUNTER — Encounter: Payer: No Typology Code available for payment source | Admitting: Physical Therapy

## 2017-01-18 ENCOUNTER — Encounter: Payer: Self-pay | Admitting: Interventional Cardiology

## 2017-01-19 ENCOUNTER — Encounter: Payer: No Typology Code available for payment source | Admitting: Physical Therapy

## 2017-01-23 ENCOUNTER — Encounter: Payer: No Typology Code available for payment source | Admitting: Physical Therapy

## 2017-01-24 ENCOUNTER — Encounter: Payer: No Typology Code available for payment source | Admitting: Physical Therapy

## 2017-01-25 ENCOUNTER — Encounter: Payer: No Typology Code available for payment source | Admitting: Physical Therapy

## 2017-01-26 ENCOUNTER — Ambulatory Visit (INDEPENDENT_AMBULATORY_CARE_PROVIDER_SITE_OTHER): Payer: No Typology Code available for payment source | Admitting: Internal Medicine

## 2017-01-26 ENCOUNTER — Ambulatory Visit: Payer: No Typology Code available for payment source | Admitting: Pharmacist

## 2017-01-26 ENCOUNTER — Encounter: Payer: Self-pay | Admitting: Internal Medicine

## 2017-01-26 VITALS — BP 127/67 | HR 78 | Temp 98.1°F | Ht 63.0 in | Wt 170.8 lb

## 2017-01-26 DIAGNOSIS — E1165 Type 2 diabetes mellitus with hyperglycemia: Secondary | ICD-10-CM

## 2017-01-26 DIAGNOSIS — I10 Essential (primary) hypertension: Secondary | ICD-10-CM

## 2017-01-26 DIAGNOSIS — E1151 Type 2 diabetes mellitus with diabetic peripheral angiopathy without gangrene: Secondary | ICD-10-CM

## 2017-01-26 DIAGNOSIS — M79675 Pain in left toe(s): Secondary | ICD-10-CM

## 2017-01-26 DIAGNOSIS — Z1211 Encounter for screening for malignant neoplasm of colon: Secondary | ICD-10-CM

## 2017-01-26 DIAGNOSIS — M79674 Pain in right toe(s): Secondary | ICD-10-CM

## 2017-01-26 DIAGNOSIS — IMO0002 Reserved for concepts with insufficient information to code with codable children: Secondary | ICD-10-CM

## 2017-01-26 DIAGNOSIS — M1612 Unilateral primary osteoarthritis, left hip: Secondary | ICD-10-CM

## 2017-01-26 DIAGNOSIS — E113299 Type 2 diabetes mellitus with mild nonproliferative diabetic retinopathy without macular edema, unspecified eye: Secondary | ICD-10-CM

## 2017-01-26 DIAGNOSIS — Z23 Encounter for immunization: Secondary | ICD-10-CM

## 2017-01-26 DIAGNOSIS — Z0000 Encounter for general adult medical examination without abnormal findings: Secondary | ICD-10-CM

## 2017-01-26 LAB — GLUCOSE, CAPILLARY: Glucose-Capillary: 187 mg/dL — ABNORMAL HIGH (ref 65–99)

## 2017-01-26 LAB — POCT GLYCOSYLATED HEMOGLOBIN (HGB A1C): Hemoglobin A1C: 6.5

## 2017-01-26 MED ORDER — CARVEDILOL 12.5 MG PO TABS: 12.5000 mg | ORAL_TABLET | Freq: Two times a day (BID) | ORAL | 6 refills | Status: DC

## 2017-01-26 MED ORDER — GLUCOSE BLOOD VI STRP: ORAL_STRIP | 12 refills | Status: DC

## 2017-01-26 MED ORDER — ATORVASTATIN CALCIUM 40 MG PO TABS: 40.0000 mg | ORAL_TABLET | Freq: Every day | ORAL | 3 refills | Status: DC

## 2017-01-26 MED ORDER — DICLOFENAC SODIUM 1 % TD GEL: 2.0000 g | Freq: Four times a day (QID) | TRANSDERMAL | 1 refills | Status: DC

## 2017-01-26 MED FILL — CARVEDILOL 12.5 MG TABLET: 12.5 | 30 days supply | Qty: 60 | Fill #0

## 2017-01-26 MED FILL — ATORVASTATIN 40 MG TABLET: 40 | 30 days supply | Qty: 30 | Fill #0

## 2017-01-26 MED FILL — DICLOFENAC SODIUM 1% GEL: 1 | 12 days supply | Qty: 100 | Fill #0

## 2017-01-26 NOTE — Assessment & Plan Note (Addendum)
Patient participating in PT for right and left hip pain/arthritis. Has completed a total of 12 sessions. She states the PT has helped her pain and she enjoys it and will continue therapy.   Plan: -Voltaren gel QID as needed for pain -Aspercreme OTC with trolamine salicylate if Voltaren gel is too expensive

## 2017-01-26 NOTE — Assessment & Plan Note (Addendum)
Patient states she has been experiencing pain in her toes due to nail overgrowth and nail thickness. She is requesting referral to podiatry.   Plan: -Podiatry referral

## 2017-01-26 NOTE — Assessment & Plan Note (Signed)
Most recent ECHO 10/2016: - Left ventricle: The cavity size was normal. Systolic function was  normal. Wall motion was normal; there were no regional wall motion abnormalities. Features are consistent with a pseudonormal   left ventricular filling pattern, with concomitant abnormal relaxation and increased filling pressure (grade 2 diastolic dysfunction). Doppler parameters are consistent with high ventricular filling pressure. - Aortic valve: Moderate focal calcification involving the noncoronary cusp. There was mild regurgitation. - Mitral valve: Severely calcified annulus. Mildly thickened leaflets . There was mild regurgitation. - Left atrium: The atrium was mildly dilated. - Tricuspid valve: There was mild regurgitation. - Pulmonic valve: There was trivial regurgitation. - Pulmonary arteries: PA peak pressure: 39 mm Hg (S). Impressions: - Compared to prior echo, LVF has normalized and PASP has decreased   from 2mmHg to 51mmHg. The right ventricular systolic pressure   was increased consistent with mild pulmonary hypertension. Lexiscan 10/2016: There is a medium defect of mild severity present in the basal inferolateral, mid inferolateral and apical lateral location. The defect is reversible. Consistent with ischemia in the distribution of a posterolateral ventricular branch of the RCA or LCx    Overall Study Impression Myocardial perfusion is abnormal. Findings consistent with ischemia. This is a low risk study. Overall left ventricular systolic function was normal. LV cavity size is normal. Nuclear stress EF: 57%. that was performed on 1/16/2015Compared to the prior study, there are changes.

## 2017-01-26 NOTE — Assessment & Plan Note (Signed)
U/S Carotid Duplex 10/2016: Heterogeneous plaque, bilaterally. 1-39% bilateral ICA stenosis. >50% stenosis in the ECA, bilaterally. Normal subclavian arteries, bilaterally. Patent vertebral arteries with antegrade flow.

## 2017-01-26 NOTE — Patient Instructions (Signed)
[name],   It was a pleasure meeting you today.   Congratulations on your great A1C and blood pressure!  I have refilled your requested medications.   Decrease Levemir to 15 units at night and continue to check you blood sugar in the morning and at night before meals.

## 2017-01-26 NOTE — Assessment & Plan Note (Signed)
Yearly eye exam had no evidence of diabetic retinopathy.

## 2017-01-26 NOTE — Assessment & Plan Note (Addendum)
Received flu vaccination 01/26/2017. Mammogram referral is placed. GI referral placed for colonoscopy. Patient has never had colonoscopy.

## 2017-01-26 NOTE — Assessment & Plan Note (Signed)
A1c 6.5 from 7.1. Patient reports AM BG between 80-90, with a few occurrences of BG in the 70s with symptoms of jitteriness and weakness, which is relieved by eating something. She is currently taking Levemir 20 units at bed time and Janumet BID.   Plan: -Decrease Levemir to 15 units at bed time -Continue Janumet BID -RTC 3-4 months

## 2017-01-26 NOTE — Assessment & Plan Note (Signed)
BP Readings from Last 3 Encounters:  01/26/17 127/67  01/05/17 120/68  12/16/16 120/66  Patient's BP at goal on current regimen: Losartan 100 mg, Amlodipine 10 mg, Carvedilol 12.5 mg BID.   Continue Losartan 100 mg, Amlodipine 10 mg, Carvedilol 12.5 mg BID.

## 2017-01-26 NOTE — Progress Notes (Signed)
   CC: Type 2 diabetes, Hypertension  HPI:  [name] is a 61 y.o. female with past medical history as documented below presenting for type 2 diabetes and hypertension follow up. She has no acute complaints today. Please see encounter based charting for in depth description of the patients chronic medical problems.   Past Medical History:  Diagnosis Date  . Arthritis   . CHF (congestive heart failure) ([place])    2014...@ [facility]  . Diabetes mellitus    diagnosed 2001  . Hypertension   . PVD (peripheral vascular disease) ([place])    Review of Systems:   Review of Systems  Constitutional: Negative for chills and fever.  Respiratory: Negative for shortness of breath.   Cardiovascular: Negative for chest pain.  Gastrointestinal: Negative for nausea and vomiting.  Musculoskeletal: Positive for joint pain.    Physical Exam:  Vitals:   01/26/17 1600 01/26/17 1604  BP: (!) 148/65 127/67  Pulse: 81 78  Temp: 98.1 F (36.7 C)   TempSrc: Oral   SpO2: 100%   Weight: 170 lb 12.8 oz (77.5 kg)   Height: 5\' 3"  (1.6 m)    General: Sitting comfortably, NAD HEENT: [place]/AT, EOMI, no scleral icterus, no cervical adenopathy, no thyromegaly  Cardiac: RRR, No R/M/G appreciated Pulm: normal effort, CTAB, no rales/wheezes/rhonci Ext: extremities well perfused, no peripheral edema Neuro: alert and oriented X3, cranial nerves II-XII grossly intact   Assessment & Plan:   See Encounters Tab for problem based charting.  Patient seen with Dr. Rebeca Alert

## 2017-01-27 NOTE — Progress Notes (Signed)
Internal Medicine Clinic Attending  I saw and evaluated the patient.  I personally confirmed the key portions of the history and exam documented by Dr. Aggie Hacker and I reviewed pertinent patient test results.  The assessment, diagnosis, and plan were formulated together and I agree with the documentation in the resident's note.  A1C now down below 7, having some lows in the mornings down into the 70s, will decrease levemir from 20 to 15, otherwise doing well. Congratulated her on her success.  Oda Kilts, MD

## 2017-01-27 NOTE — Addendum Note (Signed)
Addended by: Hulan Fray on: 01/27/2017 10:32 AM   Modules accepted: Orders

## 2017-01-30 MED FILL — JANUMET 50-1,000 MG TABLET: 50-1000 | 30 days supply | Qty: 60 | Fill #3

## 2017-02-01 ENCOUNTER — Ambulatory Visit: Payer: No Typology Code available for payment source | Admitting: Pharmacist

## 2017-02-01 ENCOUNTER — Ambulatory Visit: Payer: Self-pay | Admitting: Physical Therapy

## 2017-02-02 ENCOUNTER — Ambulatory Visit: Payer: No Typology Code available for payment source | Admitting: Pharmacist

## 2017-02-06 ENCOUNTER — Ambulatory Visit: Payer: Self-pay | Attending: Internal Medicine | Admitting: Sports Medicine

## 2017-02-06 DIAGNOSIS — B351 Tinea unguium: Secondary | ICD-10-CM

## 2017-02-06 DIAGNOSIS — M79675 Pain in left toe(s): Secondary | ICD-10-CM

## 2017-02-06 DIAGNOSIS — E1151 Type 2 diabetes mellitus with diabetic peripheral angiopathy without gangrene: Secondary | ICD-10-CM

## 2017-02-06 DIAGNOSIS — M79674 Pain in right toe(s): Secondary | ICD-10-CM

## 2017-02-06 DIAGNOSIS — IMO0002 Reserved for concepts with insufficient information to code with codable children: Secondary | ICD-10-CM

## 2017-02-06 DIAGNOSIS — E1165 Type 2 diabetes mellitus with hyperglycemia: Secondary | ICD-10-CM

## 2017-02-06 NOTE — Progress Notes (Signed)
Subjective: [name] is a 61 y.o. female patient with history of diabetes who presents to [facility] today complaining of long, painful nails  while ambulating in shoes; unable to trim. Patient states that the glucose reading this morning was 114 mg/dl. Patient denies any new changes in medication or new problems. Patient was diagnosed over 10 years ago with diabetes and states that occasionally she gets some numbness and tingling to toes. Denies any other pedal complaints.  Patient Active Problem List   Diagnosis Date Noted  . Pain in toes of both feet 01/26/2017  . Bilateral carotid artery disease ([place]) 11/10/2016  . Left flank pain 10/11/2016  . Seasonal allergies 06/30/2016  . Skin lesion of foot 03/08/2016  . Financial difficulties 03/08/2016  . Healthcare maintenance 06/16/2015  . Nonproliferative diabetic retinopathy associated with type 2 diabetes mellitus ([place]) 06/15/2015  . Osteoarthritis of right hip 06/09/2015  . Arthritis of left hip 08/18/2013  . DM (diabetes mellitus) type II uncontrolled, periph vascular disorder ([place]) 07/17/2013  . Anemia 07/17/2013  . Chronic combined systolic and diastolic CHF, NYHA class 2 ([place]) 07/07/2013  . CKD (chronic kidney disease) 11/22/2012  . HTN (hypertension) 11/08/2012  . Atherosclerosis of native arteries of the extremities with ulceration ([place]) 08/16/2012   Current Outpatient Prescriptions on File Prior to Visit  Medication Sig Dispense Refill  . amLODipine (NORVASC) 10 MG tablet Take 1 tablet (10 mg total) by mouth daily. 90 tablet 1  . aspirin EC 81 MG tablet Take 1 tablet (81 mg total) by mouth daily. IM program, hope fund 30 tablet 11  . atorvastatin (LIPITOR) 40 MG tablet Take 1 tablet (40 mg total) by mouth daily. IM program 30 tablet 3  . BAYER MICROLET LANCETS lancets Check blood sugar three times a day. ICD 10: E11.51 100 each 12  . blood glucose meter kit and supplies Check blood sugar two times a day 1 each 0  . Blood  Glucose Monitoring Suppl (CONTOUR NEXT EZ MONITOR) w/Device KIT Check blood sugar three times a day. ICD 10: E11.51 1 kit 0  . carvedilol (COREG) 12.5 MG tablet Take 1 tablet (12.5 mg total) by mouth 2 (two) times daily with a meal. IM program, hope fund 60 tablet 6  . diclofenac sodium (VOLTAREN) 1 % GEL Apply 2 g topically 4 (four) times daily. 1 Tube 1  . glucose blood (BAYER CONTOUR NEXT TEST) test strip Check blood sugar three times a day. ICD 10: E11.51 100 each 12  . insulin detemir (LEVEMIR) 100 UNIT/ML injection Inject 0.2 mLs (20 Units total) into the skin at bedtime. IM program, hope fund 10 mL 11  . Insulin Syringe-Needle U-100 31G X 15/64" 0.3 ML MISC Use to take insulin one time a day 100 each 12  . loratadine (CLARITIN) 10 MG tablet Take 1 tablet (10 mg total) by mouth daily. (Patient taking differently: Take 10 mg by mouth daily as needed for allergies. ) 30 tablet 2  . losartan (COZAAR) 100 MG tablet Take 1 tablet (100 mg total) by mouth daily. 90 tablet 3  . sitaGLIPtin-metformin (JANUMET) 50-1000 MG tablet Take 1 tablet by mouth 2 (two) times daily with a meal. IM program (Patient taking differently: Take 2 tablets by mouth 2 (two) times daily with a meal. IM program) 60 tablet 11  . triamcinolone cream (KENALOG) 0.1 % Apply 1 application topically daily as needed for irritation.  1   No current facility-administered medications on file prior to visit.  Allergies  Allergen Reactions  . Plavix [Clopidogrel Bisulfate] Rash    Recent Results (from the past 2160 hour(s))  ECHOCARDIOGRAM COMPLETE     Status: None   Collection Time: 11/24/16 10:47 AM  Result Value Ref Range   Weight 2,752 oz   Height 63.000 in  Myocardial Perfusion Imaging     Status: None   Collection Time: 11/24/16 12:32 PM  Result Value Ref Range   Rest HR 71 bpm   Rest BP 147/80 mmHg   Peak HR 88 bpm   Peak BP 159/76 mmHg   SSS 13    SRS 5    SDS 8    LHR 0.31    TID 1.09    LV sys vol 36 mL    LV dias vol 82 46 - 106 mL  Basic Metabolic Panel (BMET)     Status: Abnormal   Collection Time: 01/05/17 11:03 AM  Result Value Ref Range   Glucose 127 (H) 65 - 99 mg/dL   BUN 20 6 - 24 mg/dL   Creatinine, Ser 1.23 (H) 0.57 - 1.00 mg/dL   GFR calc non Af Amer 48 (L) >59 mL/min/1.73   GFR calc Af Amer 55 (L) >59 mL/min/1.73   BUN/Creatinine Ratio 16 9 - 23   Sodium 144 134 - 144 mmol/L   Potassium 4.2 3.5 - 5.2 mmol/L   Chloride 106 96 - 106 mmol/L   CO2 20 20 - 29 mmol/L   Calcium 9.2 8.7 - 10.2 mg/dL  Glucose, capillary     Status: Abnormal   Collection Time: 01/26/17  4:18 PM  Result Value Ref Range   Glucose-Capillary 187 (H) 65 - 99 mg/dL  POCT HgB A1C (CPT 83036)     Status: None   Collection Time: 01/26/17  4:26 PM  Result Value Ref Range   Hemoglobin A1C 6.5     Objective: General: Patient is awake, alert, and oriented x 3 and in no acute distress.  Integument: Skin is warm, dry and supple bilateral. Nails are tender, long, thickened and  dystrophic with subungual debris, consistent with onychomycosis, 1-5 bilateral. No signs of infection. No open lesions or preulcerative lesions present bilateral. Remaining integument unremarkable.  Vasculature:  Dorsalis Pedis pulse 2/4 bilateral. Posterior Tibial pulse  1/4 bilateral.  Capillary fill time <3 sec 1-5 bilateral. Positive hair growth to the level of the digits. Temperature gradient within normal limits. No varicosities present bilateral. No edema present bilateral.   Neurology: The patient has intact sensation via light touch. No Babinski sign present bilateral.   Musculoskeletal: No symptomatic pedal deformities noted bilateral. Muscular strength 5/5 in all lower extremity muscular groups bilateral without pain on range of motion . No tenderness with calf compression bilateral.  Assessment and Plan: Problem List Items Addressed This Visit      Cardiovascular and Mediastinum   DM (diabetes mellitus) type II  uncontrolled, periph vascular disorder ([place]) - Primary    Other Visit Diagnoses    Pain due to onychomycosis of toenails of both feet          -Examined patient. -Discussed and educated patient on diabetic foot care, especially with  regards to the vascular, neurological and musculoskeletal systems.  -Stressed the importance of good glycemic control and the detriment of not  controlling glucose levels in relation to the foot. -Mechanically debrided all nails 1-5 bilateral using sterile nail nipper and filed with dremel without incident  -Answered all patient questions -Patient to return  in  3 months for at risk foot care -Recommend OTC fungi-nail for thick nails and Aspercreme and vit B complex for tingling and burning to feet.  Landis Martins, DPM

## 2017-02-07 ENCOUNTER — Ambulatory Visit: Payer: No Typology Code available for payment source | Admitting: Pharmacist

## 2017-02-08 ENCOUNTER — Ambulatory Visit: Payer: Self-pay | Attending: Internal Medicine | Admitting: Physical Therapy

## 2017-02-08 DIAGNOSIS — M25551 Pain in right hip: Secondary | ICD-10-CM | POA: Insufficient documentation

## 2017-02-08 DIAGNOSIS — G8929 Other chronic pain: Secondary | ICD-10-CM | POA: Insufficient documentation

## 2017-02-08 DIAGNOSIS — R262 Difficulty in walking, not elsewhere classified: Secondary | ICD-10-CM | POA: Insufficient documentation

## 2017-02-08 DIAGNOSIS — M25552 Pain in left hip: Secondary | ICD-10-CM | POA: Insufficient documentation

## 2017-02-08 DIAGNOSIS — M545 Low back pain: Secondary | ICD-10-CM | POA: Insufficient documentation

## 2017-02-08 DIAGNOSIS — M25651 Stiffness of right hip, not elsewhere classified: Secondary | ICD-10-CM | POA: Insufficient documentation
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
## 2017-02-14 ENCOUNTER — Ambulatory Visit: Payer: Self-pay | Admitting: Physical Therapy

## 2017-02-14 DIAGNOSIS — R262 Difficulty in walking, not elsewhere classified: Secondary | ICD-10-CM

## 2017-02-14 DIAGNOSIS — G8929 Other chronic pain: Secondary | ICD-10-CM

## 2017-02-14 DIAGNOSIS — M545 Low back pain, unspecified: Secondary | ICD-10-CM

## 2017-02-14 DIAGNOSIS — M25551 Pain in right hip: Secondary | ICD-10-CM

## 2017-02-14 DIAGNOSIS — M25651 Stiffness of right hip, not elsewhere classified: Secondary | ICD-10-CM

## 2017-02-14 DIAGNOSIS — M25552 Pain in left hip: Secondary | ICD-10-CM

## 2017-02-14 MED FILL — LOSARTAN POTASSIUM 100 MG T: 100 | 30 days supply | Qty: 30 | Fill #1

## 2017-02-14 NOTE — Therapy (Signed)
[place], [place], [address] Phone: [phone]   Fax:  [phone]  Physical Therapy Treatment  Patient Details  Name: [name] MRN: [id number] Date of Birth: [birth year] Referring Provider: Dr Georga Kaufmann   Encounter Date: 02/14/2017      PT End of Session - 02/14/17 1513    Visit Number 14   Number of Visits 25   Date for PT Re-Evaluation 03/22/17   Authorization Type CAFA    PT Start Time 1445   Activity Tolerance Patient tolerated treatment well   Behavior During Therapy [facility] for tasks assessed/performed      Past Medical History:  Diagnosis Date  . Arthritis   . CHF (congestive heart failure) ([place])    2014...@ [facility]  . Diabetes mellitus    diagnosed 2001  . Hypertension   . PVD (peripheral vascular disease) ([place])     Past Surgical History:  Procedure Laterality Date  . ABDOMINAL ANGIOGRAM  12/28/2012  . ABDOMINAL ANGIOGRAM N/A 12/29/2011   Procedure: ABDOMINAL ANGIOGRAM;  Surgeon: Conrad Perry, MD;  Location: [facility] CATH LAB;  Service: Cardiovascular;  Laterality: N/A;  . ABDOMINAL AORTAGRAM N/A 12/21/2012   Procedure: ABDOMINAL Maxcine Ham;  Surgeon: Elam Dutch, MD;  Location: [facility] CATH LAB;  Service: Cardiovascular;  Laterality: N/A;  . TOTAL HIP ARTHROPLASTY Left 08/19/2013   Procedure: TOTAL HIP ARTHROPLASTY;  Surgeon: Kerin Salen, MD;  Location: [place];  Service: Orthopedics;  Laterality: Left;  . TUBAL LIGATION      There were no vitals filed for this visit.      Subjective Assessment - 02/14/17 1442    Subjective Still staying with daughter.  She is walking a bit better.     Currently in Pain? Yes   Pain Score 6    Pain Location Back   Pain Orientation Lower   Pain Type Chronic pain   Pain Onset More than a month ago   Pain Frequency Intermittent   Aggravating Factors  lifting, standing, walking, sitting    Pain Relieving Factors changing positions , Tylenol                           [facility] Adult PT Treatment/Exercise - 02/14/17 0001      Lumbar Exercises: Seated   Other Seated Lumbar Exercises Core exercises on stability ball: pelvic tilt, arm lifts, leg lifts, trunk rotation, and heel/toe taps      Lumbar Exercises: Supine   Heel Slides 10 reps   Heel Slides Limitations hamstring curl with black ball    Bridge 10 reps   Bridge Limitations x 2 sets with ball    Straight Leg Raise 10 reps   Straight Leg Raises Limitations both with legs on ball    Other Supine Lumbar Exercises LTR with ball twists x 10      Knee/Hip Exercises: Aerobic   Nustep 6 min L 1, UE and LE      Knee/Hip Exercises: Seated   Sit to Sand 2 sets;without UE support  1 set with band, 1 set with ball      Modalities   Modalities --     Moist Heat Therapy   Number Minutes Moist Heat --   Moist Heat Location --     Manual Therapy   Manual Therapy Passive ROM   Passive ROM Rt. hip ER and IR, flexion to tolerance    Manual Traction Rt.  LE long axis distraction                 PT Education - 02/14/17 1449    Education provided Yes   Education Details core strength and stability with ball    Person(s) Educated Patient   Methods Explanation   Comprehension Verbalized understanding          PT Short Term Goals - 02/08/17 1500      PT SHORT TERM GOAL #1   Title Patient will demonstrate a good core contraction for further core training.    Time 4   Period Weeks   Status Achieved     PT SHORT TERM GOAL #2   Title Patient will increase right and left hip flexion by 20 degrees bilateral   Status Achieved     PT SHORT TERM GOAL #3   Title Patient will demonstrate 4/5 gross bilateral lower extremity strength    Status Partially Met     PT SHORT TERM GOAL #4   Title Patient will be independent with initial HEP    Status Achieved           PT Long Term Goals - 02/14/17 1514      PT LONG TERM GOAL #1   Title Patient will  transfer sit to stand without hands and without pain    Status Partially Met     PT LONG TERM GOAL #2   Title Patient will ambulate  3000' with LRAD with pain < 4/10.     Status On-going     PT LONG TERM GOAL #3   Title Patient will be able to walk for 20 min with min increase in pain, min fatigue.    Status On-going     PT LONG TERM GOAL #4   Title Pt will be able to demo hip abduction to 4/5 on each leg for improved gait stabiilty.    Status On-going               Plan - 02/14/17 1529    Clinical Impression Statement Pt worked on core and hip strength using therapy props.  Tightness in bilateral hips is significant.  She did not complain of worsnening pain during session.     PT Next Visit Plan Strengthen hips, core, Continue to work on hip mobility. Continue gait.    PT Home Exercise Plan single knee to chest with towel; lateral trunk rotation; quad sets, glut sets; LAQ clams, tilt, Transverse abdominal contraction with clams and bent knee raises.    Consulted and Agree with Plan of Care Patient      Patient will benefit from skilled therapeutic intervention in order to improve the following deficits and impairments:  Abnormal gait, Decreased range of motion, Difficulty walking, Increased muscle spasms, Decreased endurance, Decreased activity tolerance, Pain, Decreased strength, Postural dysfunction  Visit Diagnosis: Pain in right hip  Pain in left hip  Chronic left-sided low back pain without sciatica  Stiffness of right hip, not elsewhere classified  Difficulty in walking, not elsewhere classified     Problem List Patient Active Problem List   Diagnosis Date Noted  . Pain in toes of both feet 01/26/2017  . Bilateral carotid artery disease ([place]) 11/10/2016  . Left flank pain 10/11/2016  . Seasonal allergies 06/30/2016  . Skin lesion of foot 03/08/2016  . Financial difficulties 03/08/2016  . Healthcare maintenance 06/16/2015  . Nonproliferative diabetic  retinopathy associated with type 2 diabetes mellitus ([place]) 06/15/2015  . Osteoarthritis  of right hip 06/09/2015  . Arthritis of left hip 08/18/2013  . DM (diabetes mellitus) type II uncontrolled, periph vascular disorder ([place]) 07/17/2013  . Anemia 07/17/2013  . Chronic combined systolic and diastolic CHF, NYHA class 2 ([place]) 07/07/2013  . CKD (chronic kidney disease) 11/22/2012  . HTN (hypertension) 11/08/2012  . Atherosclerosis of native arteries of the extremities with ulceration ([place]) 08/16/2012    Isys Tietje 02/14/2017, 3:32 PM  [facility] [address] [place], [place], [address] Phone: [phone]   Fax:  [phone]  Name: [name] MRN: [id number] Date of Birth: August 14, 1955  Raeford Razor, PT 02/14/17 3:33 PM Phone: [phone] Fax: [phone]

## 2017-02-21 ENCOUNTER — Ambulatory Visit: Payer: Self-pay | Admitting: Physical Therapy

## 2017-02-21 ENCOUNTER — Encounter: Payer: Self-pay | Admitting: Physical Therapy

## 2017-02-21 DIAGNOSIS — M25551 Pain in right hip: Secondary | ICD-10-CM

## 2017-02-21 DIAGNOSIS — G8929 Other chronic pain: Secondary | ICD-10-CM

## 2017-02-21 DIAGNOSIS — M25651 Stiffness of right hip, not elsewhere classified: Secondary | ICD-10-CM

## 2017-02-21 DIAGNOSIS — M545 Low back pain, unspecified: Secondary | ICD-10-CM

## 2017-02-21 DIAGNOSIS — R262 Difficulty in walking, not elsewhere classified: Secondary | ICD-10-CM

## 2017-02-21 DIAGNOSIS — M25552 Pain in left hip: Secondary | ICD-10-CM

## 2017-02-21 NOTE — Therapy (Signed)
[place], [place], [address] Phone: [phone]   Fax:  [phone]  Physical Therapy Treatment  Patient Details  Name: [name] MRN: [id number] Date of Birth: May 26, 1955 Referring Provider: Dr Georga Kaufmann   Encounter Date: 02/21/2017      PT End of Session - 02/21/17 1620    Visit Number 15   Number of Visits 25   Date for PT Re-Evaluation 03/22/17   PT Start Time 6962   PT Stop Time 1615   PT Time Calculation (min) 45 min   Activity Tolerance Patient tolerated treatment well   Behavior During Therapy [facility] for tasks assessed/performed      Past Medical History:  Diagnosis Date  . Arthritis   . CHF (congestive heart failure) ([place])    2014...@ [facility]  . Diabetes mellitus    diagnosed 2001  . Hypertension   . PVD (peripheral vascular disease) ([place])     Past Surgical History:  Procedure Laterality Date  . ABDOMINAL ANGIOGRAM  12/28/2012  . ABDOMINAL ANGIOGRAM N/A 12/29/2011   Procedure: ABDOMINAL ANGIOGRAM;  Surgeon: Conrad Holyrood, MD;  Location: [facility] CATH LAB;  Service: Cardiovascular;  Laterality: N/A;  . ABDOMINAL AORTAGRAM N/A 12/21/2012   Procedure: ABDOMINAL Maxcine Ham;  Surgeon: Elam Dutch, MD;  Location: [facility] CATH LAB;  Service: Cardiovascular;  Laterality: N/A;  . TOTAL HIP ARTHROPLASTY Left 08/19/2013   Procedure: TOTAL HIP ARTHROPLASTY;  Surgeon: Kerin Salen, MD;  Location: [place];  Service: Orthopedics;  Laterality: Left;  . TUBAL LIGATION      There were no vitals filed for this visit.      Subjective Assessment - 02/21/17 1527    Subjective I am sleeping on the couch for now.  My new place should be here by Halloween.  My hip on the right is not going to get better until I get the surgery.  Uses less Tylenol. ,  She was using more when she first started PT.   Currently in Pain? Yes   Pain Score 6   varies   Pain Location --  tense   Pain Descriptors / Indicators  Tightness  stiff   Pain Radiating Towards both hips and thighs   Aggravating Factors  getting up from sitting , i have to loosen. ,  laying in bed   Pain Relieving Factors change of position,  Tylenol   Effect of Pain on Daily Activities difficulty ADL's,  sleeping.   Pain Location Hip   Pain Orientation Right   Pain Descriptors / Indicators Sore;Numbness   Pain Radiating Towards lateral thighs   Aggravating Factors  laying down at night,  sitting longer during the day,     Pain Relieving Factors Tylenol   Effect of Pain on Daily Activities sleeping difficult. ,  sitting  sometimes limited to 10 minutes(  not consistant)            [facility] PT Assessment - 02/21/17 0001      AROM   Right Hip Flexion 98   Left Hip Flexion 105                     [facility] Adult PT Treatment/Exercise - 02/21/17 0001      Lumbar Exercises: Seated   Other Seated Lumbar Exercises Core exercises on stability ball: pelvic tilt, arm lifts, leg lifts, trunk rotation, and heel/toe taps   and bouncing     Lumbar Exercises: Supine  Heel Slides 10 reps   Heel Slides Limitations hamstring curl with black ball    Bridge 10 reps   Bridge Limitations x 2 sets with ball   Work out burn   Straight Leg Raise 10 reps   Straight Leg Raises Limitations legs on black ball left  with lift off,  right with LAQ,     Other Supine Lumbar Exercises LTR with ball twists x 10   close SBA      Knee/Hip Exercises: Aerobic   Nustep 6 .5  min L 1, UE and LE                   PT Short Term Goals - 02/21/17 1623      PT SHORT TERM GOAL #3   Title Patient will demonstrate 4/5 gross bilateral lower extremity strength    Time 4   Period Weeks   Status Unable to assess     PT SHORT TERM GOAL #4   Title Patient will be independent with initial HEP    Time 4   Period Weeks   Status Achieved           PT Long Term Goals - 02/21/17 1624      PT LONG TERM GOAL #1   Title Patient will transfer  sit to stand without hands and without pain    Baseline a little pain, able to rise without hands, with min difficulty   Time 8   Period Weeks   Status Partially Met     PT LONG TERM GOAL #2   Title Patient will ambulate  3000' with LRAD with pain < 4/10.     Baseline no walker today,  , walking painful.   Time 8   Period Weeks   Status On-going     PT LONG TERM GOAL #3   Title Patient will be able to walk for 20 min with min increase in pain, min fatigue.    Time 6   Period Weeks   Status Unable to assess     PT LONG TERM GOAL #4   Title Pt will be able to demo hip abduction to 4/5 on each leg for improved gait stabiilty.    Time 6   Period Weeks   Status Unable to assess               Plan - 02/21/17 1621    Clinical Impression Statement Paient continues to make gradual gains with hip ROM:   98 right,  105 Left.  patient " felt the exercise burn" intermitantly during exercise.  She declined the need for modalities at end of session.     PT Treatment/Interventions ADLs/Self Care Home Management;Gait training;Stair training;Iontophoresis 67m/ml Dexamethasone;Electrical Stimulation;Cryotherapy;Moist Heat;Ultrasound;Therapeutic activities;Therapeutic exercise;Patient/family education;Neuromuscular re-education;Splinting;Taping;Manual techniques;Passive range of motion;Balance training;DME Instruction   PT Next Visit Plan Strengthen hips, core, Continue to work on hip mobility. Continue gait. Check specific goals,  MMT. hips   PT Home Exercise Plan single knee to chest with towel; lateral trunk rotation; quad sets, glut sets; LAQ clams, tilt, Transverse abdominal contraction with clams and bent knee raises.    Consulted and Agree with Plan of Care Patient      Patient will benefit from skilled therapeutic intervention in order to improve the following deficits and impairments:     Visit Diagnosis: Pain in right hip  Pain in left hip  Chronic left-sided low back pain  without sciatica  Stiffness of right hip, not elsewhere  classified  Difficulty in walking, not elsewhere classified     Problem List Patient Active Problem List   Diagnosis Date Noted  . Pain in toes of both feet 01/26/2017  . Bilateral carotid artery disease ([place]) 11/10/2016  . Left flank pain 10/11/2016  . Seasonal allergies 06/30/2016  . Skin lesion of foot 03/08/2016  . Financial difficulties 03/08/2016  . Healthcare maintenance 06/16/2015  . Nonproliferative diabetic retinopathy associated with type 2 diabetes mellitus ([place]) 06/15/2015  . Osteoarthritis of right hip 06/09/2015  . Arthritis of left hip 08/18/2013  . DM (diabetes mellitus) type II uncontrolled, periph vascular disorder ([place]) 07/17/2013  . Anemia 07/17/2013  . Chronic combined systolic and diastolic CHF, NYHA class 2 ([place]) 07/07/2013  . CKD (chronic kidney disease) 11/22/2012  . HTN (hypertension) 11/08/2012  . Atherosclerosis of native arteries of the extremities with ulceration ([place]) 08/16/2012    Mashanda Ishibashi PTA 02/21/2017, 4:26 PM  [place], [place], [address] Phone: [phone]   Fax:  [phone]  Name: MACAIAH MANGAL MRN: [id number] Date of Birth: [birth year]

## 2017-02-23 ENCOUNTER — Ambulatory Visit: Payer: Self-pay | Admitting: Physical Therapy

## 2017-02-23 MED FILL — CARVEDILOL 12.5 MG TABLET: 12.5 | 30 days supply | Qty: 60 | Fill #1

## 2017-02-23 MED FILL — ATORVASTATIN 40 MG TABLET: 40 | 30 days supply | Qty: 30 | Fill #1

## 2017-02-27 ENCOUNTER — Encounter: Payer: Self-pay | Admitting: Physical Therapy

## 2017-02-27 ENCOUNTER — Ambulatory Visit: Payer: Self-pay | Admitting: Physical Therapy

## 2017-02-27 DIAGNOSIS — M545 Low back pain, unspecified: Secondary | ICD-10-CM

## 2017-02-27 DIAGNOSIS — G8929 Other chronic pain: Secondary | ICD-10-CM

## 2017-02-27 DIAGNOSIS — M25551 Pain in right hip: Secondary | ICD-10-CM

## 2017-02-27 DIAGNOSIS — M25651 Stiffness of right hip, not elsewhere classified: Secondary | ICD-10-CM

## 2017-02-27 DIAGNOSIS — R262 Difficulty in walking, not elsewhere classified: Secondary | ICD-10-CM

## 2017-02-27 DIAGNOSIS — M25552 Pain in left hip: Secondary | ICD-10-CM

## 2017-02-27 NOTE — Therapy (Signed)
[facility] [address] [place], [place], [address] Phone: [phone]   Fax:  [phone]  Physical Therapy Treatment  Patient Details  Name: [name] MRN: [id number] Date of Birth: 07/24/1955 Referring Provider: Dr Dian Truong   Encounter Date: 02/27/2017      PT End of Session - 02/27/17 1601    Visit Number 16   Number of Visits 25   Date for PT Re-Evaluation 03/22/17   PT Start Time 1503   PT Stop Time 1547   PT Time Calculation (min) 44 min   Activity Tolerance Patient tolerated treatment well   Behavior During Therapy WFL for tasks assessed/performed      Past Medical History:  Diagnosis Date  . Arthritis   . CHF (congestive heart failure) ([facility])    2014...@ [facility]  . Diabetes mellitus    diagnosed 2001  . Hypertension   . PVD (peripheral vascular disease) ([facility])     Past Surgical History:  Procedure Laterality Date  . ABDOMINAL ANGIOGRAM  12/28/2012  . ABDOMINAL ANGIOGRAM N/A 12/29/2011   Procedure: ABDOMINAL ANGIOGRAM;  Surgeon: Brian L Chen, MD;  Location: [facility] CATH LAB;  Service: Cardiovascular;  Laterality: N/A;  . ABDOMINAL AORTAGRAM N/A 12/21/2012   Procedure: ABDOMINAL AORTAGRAM;  Surgeon: Charles E Fields, MD;  Location: [facility] CATH LAB;  Service: Cardiovascular;  Laterality: N/A;  . TOTAL HIP ARTHROPLASTY Left 08/19/2013   Procedure: TOTAL HIP ARTHROPLASTY;  Surgeon: Frank J Rowan, MD;  Location: [facility] OR;  Service: Orthopedics;  Laterality: Left;  . TUBAL LIGATION      There were no vitals filed for this visit.      Subjective Assessment - 02/27/17 1508    Subjective I am about the same.  I want to do what we did last time with the ball.  Less numbness noted.   Currently in Pain? Yes   Pain Score 6    Pain Location Back   Pain Orientation Lower   Pain Descriptors / Indicators Tightness   Pain Type Chronic pain   Pain Radiating Towards both hips and thighs.   Pain Frequency Intermittent    Aggravating Factors  getting up from sitting,  Moving around after getting up,  laying in bed   Pain Relieving Factors change of position   Effect of Pain on Daily Activities Difficult with ADL's   Pain Score 6   Pain Location Hip   Pain Orientation Right   Pain Descriptors / Indicators Sore   Pain Type Chronic pain   Pain Frequency Constant   Aggravating Factors  laying on them,  longer sitting   Pain Relieving Factors Tylenol   Effect of Pain on Daily Activities sleeping difficult,  limited sitting                         [facility] Adult PT Treatment/Exercise - 02/27/17 0001      Ambulation/Gait   Pre-Gait Activities wall slides facing wall,  single and double weight bearing,  Moderate cues and mod assist for terminal knee control.  She was able to control knee after cues/ practice.   Gait Comments gait training with / without cane.  her cane is 5 inches too tall,  Yoga walking.  shorter steps,  keeping knees SLIGHTLY FLEXED.  Cue to slow her speed.  She wore a pair of wedges, 1 inch heel with open heel.  Heel would slip on occasion.  Advised to wear tennis shoes   for safety.    gait after instruction showed less upper trunk lean from side to side.  She had no pain with walking. at end of session.     Lumbar Exercises: Stretches   Double Knee to Chest Stretch Limitations 10 x legs on ball.   Lower Trunk Rotation Limitations 10 x 5 seconds     Lumbar Exercises: Supine   Bent Knee Raise 10 reps   Bent Knee Raise Limitations lifted from ball, monitored for low bck posture,  cued to engage abdominals for entire movement.   Bridge 10 reps   Straight Leg Raises Limitations legs on red ball                PT Education - 02/27/17 1601    Education provided Yes   Education Details gait training   Person(s) Educated Patient   Methods Explanation;Demonstration;Tactile cues;Verbal cues   Comprehension Verbalized understanding;Returned demonstration;Need further  instruction          PT Short Term Goals - 02/27/17 1605      PT SHORT TERM GOAL #1   Title Patient will demonstrate a good core contraction for further core training.    Time 4   Period Weeks   Status Achieved     PT SHORT TERM GOAL #2   Title Patient will increase right and left hip flexion by 20 degrees bilateral   Time 4   Period Weeks   Status Achieved     PT SHORT TERM GOAL #3   Title Patient will demonstrate 4/5 gross bilateral lower extremity strength    Baseline hip extension improving.  Able to lift hips off mat 4 inches.   Time 4   Period Weeks   Status On-going     PT SHORT TERM GOAL #4   Title Patient will be independent with initial HEP    Baseline independent with exercises.     Time 4   Period Weeks   Status Achieved           PT Long Term Goals - 02/21/17 1624      PT LONG TERM GOAL #1   Title Patient will transfer sit to stand without hands and without pain    Baseline a little pain, able to rise without hands, with min difficulty   Time 8   Period Weeks   Status Partially Met     PT LONG TERM GOAL #2   Title Patient will ambulate  3000' with LRAD with pain < 4/10.     Baseline no Alyssa today,  , walking painful.   Time 8   Period Weeks   Status On-going     PT LONG TERM GOAL #3   Title Patient will be able to walk for 20 min with min increase in pain, min fatigue.    Time 6   Period Weeks   Status Unable to assess     PT LONG TERM GOAL #4   Title Pt will be able to demo hip abduction to 4/5 on each leg for improved gait stabiilty.    Time 6   Period Weeks   Status Unable to assess               Plan - 02/27/17 1601    Clinical Impression Statement Gait training emphasis today.  Upper trunk lateral sway with gait decreased at least 80 % after instruction.  She was able to walk without pain.  Her cane is 5 inches too tall.  PT Treatment/Interventions ADLs/Self Care Home Management;Gait training;Stair  training;Iontophoresis 4mg/ml Dexamethasone;Electrical Stimulation;Cryotherapy;Moist Heat;Ultrasound;Therapeutic activities;Therapeutic exercise;Patient/family education;Neuromuscular re-education;Splinting;Taping;Manual techniques;Passive range of motion;Balance training;DME Instruction   PT Next Visit Plan Strengthen hips, core, Continue to work on hip mobility. Continue gait.  Work on terminal knee control.  She will wear tennis shoes vs heeled slipons.   PT Home Exercise Plan single knee to chest with towel; lateral trunk rotation; quad sets, glut sets; LAQ clams, tilt, Transverse abdominal contraction with clams and bent knee raises.    Consulted and Agree with Plan of Care Patient      Patient will benefit from skilled therapeutic intervention in order to improve the following deficits and impairments:     Visit Diagnosis: Pain in right hip  Pain in left hip  Chronic left-sided low back pain without sciatica  Stiffness of right hip, not elsewhere classified  Difficulty in walking, not elsewhere classified     Problem List Patient Active Problem List   Diagnosis Date Noted  . Pain in toes of both feet 01/26/2017  . Bilateral carotid artery disease ([facility]) 11/10/2016  . Left flank pain 10/11/2016  . Seasonal allergies 06/30/2016  . Skin lesion of foot 03/08/2016  . Financial difficulties 03/08/2016  . Healthcare maintenance 06/16/2015  . Nonproliferative diabetic retinopathy associated with type 2 diabetes mellitus ([facility]) 06/15/2015  . Osteoarthritis of right hip 06/09/2015  . Arthritis of left hip 08/18/2013  . DM (diabetes mellitus) type II uncontrolled, periph vascular disorder ([facility]) 07/17/2013  . Anemia 07/17/2013  . Chronic combined systolic and diastolic CHF, NYHA class 2 ([facility]) 07/07/2013  . CKD (chronic kidney disease) 11/22/2012  . HTN (hypertension) 11/08/2012  . Atherosclerosis of native arteries of the extremities with ulceration ([facility]) 08/16/2012     HARRIS,KAREN PTA 02/27/2017, 4:07 PM  [facility] [address] , [place], [address] Phone: [phone]   Fax:  [phone]  Name: Fareeha L Kratky MRN: [id number] Date of Birth: 12/18/1955   

## 2017-02-27 NOTE — Patient Instructions (Signed)
Wear tennis shoes.

## 2017-03-01 ENCOUNTER — Ambulatory Visit: Payer: Self-pay | Admitting: Physical Therapy

## 2017-03-01 ENCOUNTER — Encounter: Payer: Self-pay | Admitting: Physical Therapy

## 2017-03-01 DIAGNOSIS — M25651 Stiffness of right hip, not elsewhere classified: Secondary | ICD-10-CM

## 2017-03-01 DIAGNOSIS — G8929 Other chronic pain: Secondary | ICD-10-CM

## 2017-03-01 DIAGNOSIS — R262 Difficulty in walking, not elsewhere classified: Secondary | ICD-10-CM

## 2017-03-01 DIAGNOSIS — M545 Low back pain, unspecified: Secondary | ICD-10-CM

## 2017-03-01 DIAGNOSIS — M25551 Pain in right hip: Secondary | ICD-10-CM

## 2017-03-01 DIAGNOSIS — M25552 Pain in left hip: Secondary | ICD-10-CM

## 2017-03-01 NOTE — Therapy (Signed)
[place], [place], [address] Phone: [phone]   Fax:  [phone]  Physical Therapy Treatment  Patient Details  Name: [name] MRN: [id number] Date of Birth: [birth year] Referring Provider: Dr Georga Kaufmann   Encounter Date: 03/01/2017      PT End of Session - 03/01/17 1640    Visit Number 17   Number of Visits 25   Date for PT Re-Evaluation 03/22/17   PT Start Time 2010   PT Stop Time 1628   PT Time Calculation (min) 39 min   Activity Tolerance Patient tolerated treatment well   Behavior During Therapy [facility] for tasks assessed/performed      Past Medical History:  Diagnosis Date  . Arthritis   . CHF (congestive heart failure) ([place])    2014...@ [facility]  . Diabetes mellitus    diagnosed 2001  . Hypertension   . PVD (peripheral vascular disease) ([place])     Past Surgical History:  Procedure Laterality Date  . ABDOMINAL ANGIOGRAM  12/28/2012  . ABDOMINAL ANGIOGRAM N/A 12/29/2011   Procedure: ABDOMINAL ANGIOGRAM;  Surgeon: Conrad Gardner, MD;  Location: [facility] CATH LAB;  Service: Cardiovascular;  Laterality: N/A;  . ABDOMINAL AORTAGRAM N/A 12/21/2012   Procedure: ABDOMINAL Maxcine Ham;  Surgeon: Elam Dutch, MD;  Location: [facility] CATH LAB;  Service: Cardiovascular;  Laterality: N/A;  . TOTAL HIP ARTHROPLASTY Left 08/19/2013   Procedure: TOTAL HIP ARTHROPLASTY;  Surgeon: Kerin Salen, MD;  Location: [facility];  Service: Orthopedics;  Laterality: Left;  . TUBAL LIGATION      There were no vitals filed for this visit.      Subjective Assessment - 03/01/17 1554    Subjective I hit right on door and pain shot up mid thigh,  ,  pain 5/10.  with walking,   I have tried to walk correctly.  I keep reminding mysef to do it correct.  It will take awhile to do it without thinking .    i am moving into the apartment.  I am going to get help.   Currently in Pain? Yes   Pain Relieving Factors Back pain                          [facility] Adult PT Treatment/Exercise - 03/01/17 0001      Ambulation/Gait   Gait Comments on level,  min cues.  technique / comfort improving.  tension noted in right hip.      Lumbar Exercises: Standing   Other Standing Lumbar Exercises pillof press 10 X each red band,  knees flexed     Lumbar Exercises: Seated   Other Seated Lumbar Exercises sitting ball diagonals 10 X each     Knee/Hip Exercises: Standing   Terminal Knee Extension Limitations red band 10 x 3 different positions each leg,  red band.     Other Standing Knee Exercises small marches 10 X 2 sets.  moderate use of hands (on mat and on cane)     Knee/Hip Exercises: Seated   Ball Squeeze 10  X 2 sets   Marching Limitations 10 X marching,  right hip 5/10   Hamstring Curl 10 reps;2 sets   Hamstring Limitations red band,  harder right     Cryotherapy   Number Minutes Cryotherapy 10 Minutes   Cryotherapy Location Knee  right   Type of Cryotherapy --  cold pack.  Concurrent with sitting exercises.  PT Short Term Goals - 02/27/17 1605      PT SHORT TERM GOAL #1   Title Patient will demonstrate a good core contraction for further core training.    Time 4   Period Weeks   Status Achieved     PT SHORT TERM GOAL #2   Title Patient will increase right and left hip flexion by 20 degrees bilateral   Time 4   Period Weeks   Status Achieved     PT SHORT TERM GOAL #3   Title Patient will demonstrate 4/5 gross bilateral lower extremity strength    Baseline hip extension improving.  Able to lift hips off mat 4 inches.   Time 4   Period Weeks   Status On-going     PT SHORT TERM GOAL #4   Title Patient will be independent with initial HEP    Baseline independent with exercises.     Time 4   Period Weeks   Status Achieved           PT Long Term Goals - 02/21/17 1624      PT LONG TERM GOAL #1   Title Patient will transfer sit to stand without  hands and without pain    Baseline a little pain, able to rise without hands, with min difficulty   Time 8   Period Weeks   Status Partially Met     PT LONG TERM GOAL #2   Title Patient will ambulate  3000' with LRAD with pain < 4/10.     Baseline no walker today,  , walking painful.   Time 8   Period Weeks   Status On-going     PT LONG TERM GOAL #3   Title Patient will be able to walk for 20 min with min increase in pain, min fatigue.    Time 6   Period Weeks   Status Unable to assess     PT LONG TERM GOAL #4   Title Pt will be able to demo hip abduction to 4/5 on each leg for improved gait stabiilty.    Time 6   Period Weeks   Status Unable to assess               Plan - 03/01/17 1641    Clinical Impression Statement Gait continues to improve.  She was able to walk and exercise with minimum back pain due to wearing a elastic  back brace.    PT Treatment/Interventions ADLs/Self Care Home Management;Gait training;Stair training;Iontophoresis 30m/ml Dexamethasone;Electrical Stimulation;Cryotherapy;Moist Heat;Ultrasound;Therapeutic activities;Therapeutic exercise;Patient/family education;Neuromuscular re-education;Splinting;Taping;Manual techniques;Passive range of motion;Balance training;DME Instruction   PT Next Visit Plan Strengthen hips, core, Continue to work on hip mobility. Continue gait.  Work on terminal knee control.  She will wear tennis shoes vs heeled slipons.   PT Home Exercise Plan single knee to chest with towel; lateral trunk rotation; quad sets, glut sets; LAQ clams, tilt, Transverse abdominal contraction with clams and bent knee raises. issue band exercises.   Consulted and Agree with Plan of Care Patient      Patient will benefit from skilled therapeutic intervention in order to improve the following deficits and impairments:  Abnormal gait, Decreased range of motion, Difficulty walking, Increased muscle spasms, Decreased endurance, Decreased activity  tolerance, Pain, Decreased strength, Postural dysfunction  Visit Diagnosis: Pain in right hip  Pain in left hip  Chronic left-sided low back pain without sciatica  Stiffness of right hip, not elsewhere classified  Difficulty in walking, not elsewhere  classified     Problem List Patient Active Problem List   Diagnosis Date Noted  . Pain in toes of both feet 01/26/2017  . Bilateral carotid artery disease ([place]) 11/10/2016  . Left flank pain 10/11/2016  . Seasonal allergies 06/30/2016  . Skin lesion of foot 03/08/2016  . Financial difficulties 03/08/2016  . Healthcare maintenance 06/16/2015  . Nonproliferative diabetic retinopathy associated with type 2 diabetes mellitus ([place]) 06/15/2015  . Osteoarthritis of right hip 06/09/2015  . Arthritis of left hip 08/18/2013  . DM (diabetes mellitus) type II uncontrolled, periph vascular disorder ([place]) 07/17/2013  . Anemia 07/17/2013  . Chronic combined systolic and diastolic CHF, NYHA class 2 ([place]) 07/07/2013  . CKD (chronic kidney disease) 11/22/2012  . HTN (hypertension) 11/08/2012  . Atherosclerosis of native arteries of the extremities with ulceration ([place]) 08/16/2012    Brittnay Pigman PTA 03/01/2017, 4:45 PM  [facility] [address] [place], [place], [address] Phone: [phone]   Fax:  [phone]  Name: [name] MRN: [id number] Date of Birth: Oct 25, 1955

## 2017-03-06 ENCOUNTER — Encounter: Payer: Self-pay | Admitting: Physical Therapy

## 2017-03-06 ENCOUNTER — Ambulatory Visit: Payer: Self-pay | Attending: Internal Medicine | Admitting: Physical Therapy

## 2017-03-06 DIAGNOSIS — M25552 Pain in left hip: Secondary | ICD-10-CM | POA: Insufficient documentation

## 2017-03-06 DIAGNOSIS — R262 Difficulty in walking, not elsewhere classified: Secondary | ICD-10-CM | POA: Insufficient documentation

## 2017-03-06 DIAGNOSIS — M25651 Stiffness of right hip, not elsewhere classified: Secondary | ICD-10-CM | POA: Insufficient documentation

## 2017-03-06 NOTE — Therapy (Addendum)
[place], [place], [address] Phone: [phone]   Fax:  [phone]  Physical Therapy Treatment/Discharge  Patient Details  Name: [name] MRN: [id number] Date of Birth: 1955/12/19 Referring Provider: Dr Georga Kaufmann    Encounter Date: 03/06/2017  PT End of Session - 03/06/17 1648    Visit Number  18    Number of Visits  25    Date for PT Re-Evaluation  03/22/17    PT Start Time  1550    PT Stop Time  1630    PT Time Calculation (min)  40 min    Behavior During Therapy  [facility] for tasks assessed/performed       Past Medical History:  Diagnosis Date  . Arthritis   . CHF (congestive heart failure) ([place])    2014...@ [facility]  . Diabetes mellitus    diagnosed 2001  . Hypertension   . PVD (peripheral vascular disease) ([place])     Past Surgical History:  Procedure Laterality Date  . ABDOMINAL ANGIOGRAM  12/28/2012  . TUBAL LIGATION      There were no vitals filed for this visit.  Subjective Assessment - 03/06/17 1555    Subjective  Pain in right knee is fine.  the ice helped.   Pain is 6/10 after sitting in the car a long time, i hips    Currently in Pain?  Yes    Pain Score  -- just a little    just a little    Pain Location  Back    Pain Orientation  Lower    Pain Descriptors / Indicators  Tightness    Pain Type  Chronic pain    Pain Radiating Towards  hips and thighs    Pain Frequency  Intermittent    Aggravating Factors   getting up from sitting,  longer walking    Pain Relieving Factors  rest.     Effect of Pain on Daily Activities  Longer walking limited.    Pain Score  6    Pain Location  Hip    Pain Orientation  Right    Pain Descriptors / Indicators  Sore    Pain Type  Chronic pain    Pain Radiating Towards  lateral thighs    Aggravating Factors   longer sitting,  layng on them    Pain Relieving Factors  Tylenol    Effect of Pain on Daily Activities  limited sleeping,  sitting,   walking                      [facility] Adult PT Treatment/Exercise - 03/06/17 0001      Self-Care   Other Self-Care Comments   cane adjusted to correct size.      Lumbar Exercises: Stretches   Double Knee to Chest Stretch Limitations  10 x legs on  ball    Lower Trunk Rotation Limitations  10 x 5 seconds      Lumbar Exercises: Seated   Other Seated Lumbar Exercises  on ball,  marches,  LAQ,  bounch,  reach.,  bounce and reach   .  paloff press  and shoulder diagonals yellow band      Knee/Hip Exercises: Aerobic   Elliptical  2 minutes ramp 1,  CGA and footstool to get on/off      Knee/Hip Exercises: Standing   Heel Raises  10 reps toe lifts 10 x too   toe lifts 10 x  too     Knee/Hip Exercises: Seated   Sit to Sand  10 reps from high low table, set a little higher.   from high low table, set a little higher.              PT Short Term Goals - 03/06/17 1652      PT SHORT TERM GOAL #1   Title  Patient will demonstrate a good core contraction for further core training.     Time  4    Period  Weeks    Status  Achieved      PT SHORT TERM GOAL #2   Title  Patient will increase right and left hip flexion by 20 degrees bilateral    Time  4    Period  Weeks    Status  Achieved      PT SHORT TERM GOAL #3   Title  Patient will demonstrate 4/5 gross bilateral lower extremity strength     Baseline  Hip flexion right 4-/5,  Left 4/5.    Time  4    Period  Weeks    Status  On-going      PT SHORT TERM GOAL #4   Title  Patient will be independent with initial HEP     Baseline  independent with exercises.      Time  4    Period  Weeks    Status  Achieved        PT Long Term Goals - 02/21/17 1624      PT LONG TERM GOAL #1   Title  Patient will transfer sit to stand without hands and without pain     Baseline  a little pain, able to rise without hands, with min difficulty    Time  8    Period  Weeks    Status  Partially Met      PT LONG TERM GOAL #2    Title  Patient will ambulate  3000' with LRAD with pain < 4/10.      Baseline  no walker today,  , walking painful.    Time  8    Period  Weeks    Status  On-going      PT LONG TERM GOAL #3   Title  Patient will be able to walk for 20 min with min increase in pain, min fatigue.     Time  6    Period  Weeks    Status  Unable to assess      PT LONG TERM GOAL #4   Title  Pt will be able to demo hip abduction to 4/5 on each leg for improved gait stabiilty.     Time  6    Period  Weeks    Status  Unable to assess            Plan - 03/06/17 1648    Clinical Impression Statement  Balance exercises on ball sitting reveal increased strength in core.  Hip flexion right 4-/5,  left 4/5.  Progress toward strength goals.  Patient now has an adjustable cane that is the right size.  Walking requires less energy.     PT Next Visit Plan  Strengthen hips, core, Continue to work on hip mobility. Continue gait.  Work on terminal knee control.  She will wear tennis shoes vs heeled slipons.  Try elliptical again.    PT Home Exercise Plan  single knee to chest with towel; lateral trunk rotation;  quad sets, glut sets; LAQ clams, tilt, Transverse abdominal contraction with clams and bent knee raises. issue band exercises.    Consulted and Agree with Plan of Care  Patient       Patient will benefit from skilled therapeutic intervention in order to improve the following deficits and impairments:     Visit Diagnosis: Pain in left hip  Stiffness of right hip, not elsewhere classified  Difficulty in walking, not elsewhere classified     Problem List Patient Active Problem List   Diagnosis Date Noted  . Pain in toes of both feet 01/26/2017  . Bilateral carotid artery disease ([place]) 11/10/2016  . Left flank pain 10/11/2016  . Seasonal allergies 06/30/2016  . Skin lesion of foot 03/08/2016  . Financial difficulties 03/08/2016  . Healthcare maintenance 06/16/2015  . 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 Osteoarthritis of right hip 06/09/2015  . Arthritis of left hip 08/18/2013  . DM (diabetes mellitus) type II uncontrolled, periph vascular disorder ([place]) 07/17/2013  . Anemia 07/17/2013  . Chronic combined systolic and diastolic CHF, NYHA class 2 ([place]) 07/07/2013  . CKD (chronic kidney disease) 11/22/2012  . HTN (hypertension) 11/08/2012  . Atherosclerosis of native arteries of the extremities with ulceration ([place]) 08/16/2012    Satara Virella  PTA  03/06/2017, 4:54 PM  [place] [facility] [address] [place], [place], [address] Phone: [phone]   Fax:  [phone]  Name: FAITH PATRICELLI MRN: [id number] Date of Birth: 1955-07-02  PHYSICAL THERAPY DISCHARGE SUMMARY  Visits from Start of Care: 18  Current functional level related to goals / functional outcomes: See above    Remaining deficits: Unknown   Education / Equipment: HEP, posture, body mechanics  Plan: Patient agrees to discharge.  Patient goals were partially met. Patient is being discharged due to not returning since the last visit.  ?????    Raeford Razor, PT 11/03/17 10:13 AM Phone: [phone] Fax: [phone]

## 2017-03-07 MED FILL — JANUMET 50-1,000 MG TABLET: 50-1000 | 30 days supply | Qty: 60 | Fill #4

## 2017-03-08 ENCOUNTER — Ambulatory Visit: Payer: Self-pay | Admitting: Physical Therapy

## 2017-03-13 ENCOUNTER — Ambulatory Visit: Payer: Self-pay | Admitting: Physical Therapy

## 2017-03-15 ENCOUNTER — Ambulatory Visit: Payer: Self-pay | Admitting: Physical Therapy

## 2017-03-15 ENCOUNTER — Telehealth: Payer: Self-pay | Admitting: Physical Therapy

## 2017-03-15 MED FILL — LEVEMIR 100 UNITS/ML VIAL: 100 | 30 days supply | Qty: 10 | Fill #5

## 2017-03-15 NOTE — Telephone Encounter (Signed)
Left message regarding NS today. Advised pt that she does not have any more appointments scheduled and to please contact us to schedule. Evonte Prestage C. Elinda Bunten PT, DPT 03/15/17 4:22 PM

## 2017-03-21 MED FILL — LOSARTAN POTASSIUM 100 MG T: 100 | 30 days supply | Qty: 30 | Fill #2

## 2017-03-21 MED FILL — AMLODIPINE BESYLATE 10 MG T: 10 | 90 days supply | Qty: 90 | Fill #1

## 2017-04-04 MED FILL — ATORVASTATIN 40 MG TABLET: 40 | 30 days supply | Qty: 30 | Fill #2

## 2017-04-07 MED FILL — JANUMET 50-1,000 MG TABLET: 50-1000 | 30 days supply | Qty: 60 | Fill #5

## 2017-04-07 MED FILL — CARVEDILOL 12.5 MG TABLET: 12.5 | 30 days supply | Qty: 60 | Fill #6

## 2017-04-17 ENCOUNTER — Ambulatory Visit: Payer: No Typology Code available for payment source | Admitting: Interventional Cardiology

## 2017-04-21 ENCOUNTER — Ambulatory Visit: Payer: Self-pay

## 2017-04-27 MED FILL — LOSARTAN POTASSIUM 100 MG T: 100 | 30 days supply | Qty: 30 | Fill #3

## 2017-05-10 NOTE — Progress Notes (Deleted)
Cardiology Office Note    Date:  05/10/2017   ID:  [name], DOB Sep 22, 1955, MRN [id number]  PCP:  Melanee Spry, MD  Cardiologist: Sinclair Grooms, MD   No chief complaint on file.   History of Present Illness:  [name] is a 62 y.o. female chronic diastolic heart failure, PAD with claudication, and essential hypertension.      Past Medical History:  Diagnosis Date  . Arthritis   . CHF (congestive heart failure) ([place])    2014...@ [facility]  . Diabetes mellitus    diagnosed 2001  . Hypertension   . PVD (peripheral vascular disease) ([place])     Past Surgical History:  Procedure Laterality Date  . ABDOMINAL ANGIOGRAM  12/28/2012  . ABDOMINAL ANGIOGRAM N/A 12/29/2011   Procedure: ABDOMINAL ANGIOGRAM;  Surgeon: Conrad Boley, MD;  Location: [facility] CATH LAB;  Service: Cardiovascular;  Laterality: N/A;  . ABDOMINAL AORTAGRAM N/A 12/21/2012   Procedure: ABDOMINAL Maxcine Ham;  Surgeon: Elam Dutch, MD;  Location: [facility] CATH LAB;  Service: Cardiovascular;  Laterality: N/A;  . TOTAL HIP ARTHROPLASTY Left 08/19/2013   Procedure: TOTAL HIP ARTHROPLASTY;  Surgeon: Kerin Salen, MD;  Location: [place];  Service: Orthopedics;  Laterality: Left;  . TUBAL LIGATION      Current Medications: Outpatient Medications Prior to Visit  Medication Sig Dispense Refill  . amLODipine (NORVASC) 10 MG tablet Take 1 tablet (10 mg total) by mouth daily. 90 tablet 1  . aspirin EC 81 MG tablet Take 1 tablet (81 mg total) by mouth daily. IM program, hope fund 30 tablet 11  . atorvastatin (LIPITOR) 40 MG tablet Take 1 tablet (40 mg total) by mouth daily. IM program 30 tablet 3  . BAYER MICROLET LANCETS lancets Check blood sugar three times a day. ICD 10: E11.51 100 each 12  . blood glucose meter kit and supplies Check blood sugar two times a day 1 each 0  . Blood Glucose Monitoring Suppl (CONTOUR NEXT EZ MONITOR) w/Device KIT Check blood sugar three times a day. ICD 10: E11.51 1 kit 0  .  carvedilol (COREG) 12.5 MG tablet Take 1 tablet (12.5 mg total) by mouth 2 (two) times daily with a meal. IM program, hope fund 60 tablet 6  . diclofenac sodium (VOLTAREN) 1 % GEL Apply 2 g topically 4 (four) times daily. 1 Tube 1  . glucose blood (BAYER CONTOUR NEXT TEST) test strip Check blood sugar three times a day. ICD 10: E11.51 100 each 12  . insulin detemir (LEVEMIR) 100 UNIT/ML injection Inject 0.2 mLs (20 Units total) into the skin at bedtime. IM program, hope fund 10 mL 11  . Insulin Syringe-Needle U-100 31G X 15/64" 0.3 ML MISC Use to take insulin one time a day 100 each 12  . loratadine (CLARITIN) 10 MG tablet Take 1 tablet (10 mg total) by mouth daily. (Patient taking differently: Take 10 mg by mouth daily as needed for allergies. ) 30 tablet 2  . losartan (COZAAR) 100 MG tablet Take 1 tablet (100 mg total) by mouth daily. 90 tablet 3  . sitaGLIPtin-metformin (JANUMET) 50-1000 MG tablet Take 1 tablet by mouth 2 (two) times daily with a meal. IM program (Patient taking differently: Take 2 tablets by mouth 2 (two) times daily with a meal. IM program) 60 tablet 11  . triamcinolone cream (KENALOG) 0.1 % Apply 1 application topically daily as needed for irritation.  1   No facility-administered medications prior to  visit.      Allergies:   Plavix [clopidogrel bisulfate]   Social History   Socioeconomic History  . Marital status: Married    Spouse name: Not on file  . Number of children: Not on file  . Years of education: 12,college  . Highest education level: Not on file  Social Needs  . Financial resource strain: Not on file  . Food insecurity - worry: Not on file  . Food insecurity - inability: Not on file  . Transportation needs - medical: Not on file  . Transportation needs - non-medical: Not on file  Occupational History  . Not on file  Tobacco Use  . Smoking status: Never Smoker  . Smokeless tobacco: Never Used  Substance and Sexual Activity  . Alcohol use: No     Alcohol/week: 0.0 oz    Comment: ocassionally  . Drug use: No  . Sexual activity: Yes  Other Topics Concern  . Not on file  Social History Narrative  . Not on file     Family History:  The patient's ***family history includes Diabetes in her mother.   ROS:   Please see the history of present illness.    ***  All other systems reviewed and are negative.   PHYSICAL EXAM:   VS:  There were no vitals taken for this visit.   GEN: Well nourished, well developed, in no acute distress  HEENT: normal  Neck: no JVD, carotid bruits, or masses Cardiac: ***RRR; no murmurs, rubs, or gallops,no edema  Respiratory:  clear to auscultation bilaterally, normal work of breathing GI: soft, nontender, nondistended, + BS MS: no deformity or atrophy  Skin: warm and dry, no rash Neuro:  Alert and Oriented x 3, Strength and sensation are intact Psych: euthymic mood, full affect  Wt Readings from Last 3 Encounters:  01/26/17 170 lb 12.8 oz (77.5 kg)  01/05/17 169 lb (76.7 kg)  12/16/16 171 lb 9.6 oz (77.8 kg)      Studies/Labs Reviewed:   EKG:  EKG  ***  Recent Labs: 01/05/2017: BUN 20; Creatinine, Ser 1.23; Potassium 4.2; Sodium 144   Lipid Panel    Component Value Date/Time   CHOL 168 11/08/2012 1331   TRIG 95 11/08/2012 1331   HDL 45 11/08/2012 1331   CHOLHDL 3.7 11/08/2012 1331   VLDL 19 11/08/2012 1331   LDLCALC 104 (H) 11/08/2012 1331    Additional studies/ records that were reviewed today include:  ***    ASSESSMENT:    1. Chronic combined systolic and diastolic CHF, NYHA class 2 ([place])   2. Essential hypertension   3. Bilateral carotid artery stenosis   4. Atherosclerosis of native arteries of the extremities with ulceration ([facility])      PLAN:  In order of problems listed above:  1. ***    Medication Adjustments/Labs and Tests Ordered: Current medicines are reviewed at length with the patient today.  Concerns regarding medicines are outlined above.  Medication  changes, Labs and Tests ordered today are listed in the Patient Instructions below. There are no Patient Instructions on file for this visit.   Signed, Sinclair Grooms, MD  05/10/2017 8:00 PM    [place], [place], [place]  [address] Phone: [phone]; Fax: [phone]

## 2017-05-11 ENCOUNTER — Ambulatory Visit: Payer: No Typology Code available for payment source | Admitting: Interventional Cardiology

## 2017-05-12 ENCOUNTER — Encounter: Payer: Self-pay | Admitting: Interventional Cardiology

## 2017-05-12 MED FILL — JANUMET 50-1,000 MG TABLET: 50-1000 | 30 days supply | Qty: 60 | Fill #6

## 2017-05-12 MED FILL — ATORVASTATIN 40 MG TABLET: 40 | 30 days supply | Qty: 30 | Fill #3

## 2017-05-15 ENCOUNTER — Encounter: Payer: Self-pay | Admitting: Dietician

## 2017-05-15 ENCOUNTER — Telehealth: Payer: Self-pay | Admitting: Dietician

## 2017-05-15 DIAGNOSIS — E1151 Type 2 diabetes mellitus with diabetic peripheral angiopathy without gangrene: Secondary | ICD-10-CM

## 2017-05-15 DIAGNOSIS — E1165 Type 2 diabetes mellitus with hyperglycemia: Principal | ICD-10-CM

## 2017-05-15 DIAGNOSIS — IMO0002 Reserved for concepts with insufficient information to code with codable children: Secondary | ICD-10-CM

## 2017-05-15 MED ORDER — "INSULIN SYRINGE-NEEDLE U-100 31G X 15/64"" 0.3 ML MISC": 12 refills | Status: DC

## 2017-05-15 NOTE — Telephone Encounter (Signed)
Signed Rx on box in front of your door.

## 2017-05-15 NOTE — Telephone Encounter (Signed)
Called [facility] on [name]'s behalf. [name] qualifies for pt assistance for insulin syringes.    Needs prescription to mail to them. Information regarding the pt assistance and a printed prescription mailed to [name].  She was notified that she should hear from [facility] to learn how to obtain the syringes by mail from [facility].

## 2017-05-16 ENCOUNTER — Encounter: Payer: Self-pay | Admitting: Interventional Cardiology

## 2017-05-16 MED FILL — CARVEDILOL 12.5 MG TABLET: 12.5 | 30 days supply | Qty: 60 | Fill #2

## 2017-05-25 ENCOUNTER — Other Ambulatory Visit: Payer: Self-pay | Admitting: *Deleted

## 2017-05-25 DIAGNOSIS — E1151 Type 2 diabetes mellitus with diabetic peripheral angiopathy without gangrene: Secondary | ICD-10-CM

## 2017-05-25 DIAGNOSIS — E1165 Type 2 diabetes mellitus with hyperglycemia: Principal | ICD-10-CM

## 2017-05-25 DIAGNOSIS — IMO0002 Reserved for concepts with insufficient information to code with codable children: Secondary | ICD-10-CM

## 2017-05-25 MED ORDER — INSULIN DETEMIR 100 UNIT/ML ~~LOC~~ SOLN: 20.0000 [IU] | Freq: Every day | SUBCUTANEOUS | 11 refills | Status: DC

## 2017-05-26 MED FILL — LEVEMIR 100 UNITS/ML VIAL: 100 | 28 days supply | Qty: 10 | Fill #0

## 2017-06-01 ENCOUNTER — Emergency Department (HOSPITAL_COMMUNITY): Payer: Self-pay

## 2017-06-01 ENCOUNTER — Other Ambulatory Visit: Payer: Self-pay

## 2017-06-01 ENCOUNTER — Encounter (HOSPITAL_COMMUNITY): Payer: Self-pay

## 2017-06-01 ENCOUNTER — Inpatient Hospital Stay (HOSPITAL_COMMUNITY)
Admission: EM | Admit: 2017-06-01 | Discharge: 2017-06-05 | DRG: 193 | Disposition: A | Discharge status: Home / Self Care | Payer: Self-pay | Attending: Oncology | Admitting: Oncology

## 2017-06-01 ENCOUNTER — Observation Stay (HOSPITAL_COMMUNITY): Payer: Self-pay

## 2017-06-01 DIAGNOSIS — Z597 Insufficient social insurance and welfare support: Secondary | ICD-10-CM

## 2017-06-01 DIAGNOSIS — Z794 Long term (current) use of insulin: Secondary | ICD-10-CM | POA: Diagnosis present

## 2017-06-01 DIAGNOSIS — I10 Essential (primary) hypertension: Secondary | ICD-10-CM | POA: Diagnosis present

## 2017-06-01 DIAGNOSIS — Z9119 Patient's noncompliance with other medical treatment and regimen: Secondary | ICD-10-CM

## 2017-06-01 DIAGNOSIS — IMO0001 Reserved for inherently not codable concepts without codable children: Secondary | ICD-10-CM | POA: Diagnosis present

## 2017-06-01 DIAGNOSIS — I5042 Chronic combined systolic (congestive) and diastolic (congestive) heart failure: Secondary | ICD-10-CM | POA: Diagnosis present

## 2017-06-01 DIAGNOSIS — R0902 Hypoxemia: Secondary | ICD-10-CM

## 2017-06-01 DIAGNOSIS — R000 Tachycardia, unspecified: Secondary | ICD-10-CM

## 2017-06-01 DIAGNOSIS — J09X2 Influenza due to identified novel influenza A virus with other respiratory manifestations: Secondary | ICD-10-CM

## 2017-06-01 DIAGNOSIS — J101 Influenza due to other identified influenza virus with other respiratory manifestations: Secondary | ICD-10-CM

## 2017-06-01 DIAGNOSIS — Z96642 Presence of left artificial hip joint: Secondary | ICD-10-CM | POA: Diagnosis present

## 2017-06-01 DIAGNOSIS — Z79899 Other long term (current) drug therapy: Secondary | ICD-10-CM

## 2017-06-01 DIAGNOSIS — J9601 Acute respiratory failure with hypoxia: Secondary | ICD-10-CM

## 2017-06-01 DIAGNOSIS — J129 Viral pneumonia, unspecified: Secondary | ICD-10-CM | POA: Diagnosis present

## 2017-06-01 DIAGNOSIS — Z95828 Presence of other vascular implants and grafts: Secondary | ICD-10-CM

## 2017-06-01 DIAGNOSIS — N179 Acute kidney failure, unspecified: Secondary | ICD-10-CM | POA: Diagnosis present

## 2017-06-01 DIAGNOSIS — E1165 Type 2 diabetes mellitus with hyperglycemia: Secondary | ICD-10-CM | POA: Diagnosis present

## 2017-06-01 DIAGNOSIS — I429 Cardiomyopathy, unspecified: Secondary | ICD-10-CM | POA: Diagnosis present

## 2017-06-01 DIAGNOSIS — Z833 Family history of diabetes mellitus: Secondary | ICD-10-CM

## 2017-06-01 DIAGNOSIS — E1151 Type 2 diabetes mellitus with diabetic peripheral angiopathy without gangrene: Secondary | ICD-10-CM | POA: Diagnosis present

## 2017-06-01 DIAGNOSIS — J189 Pneumonia, unspecified organism: Secondary | ICD-10-CM

## 2017-06-01 DIAGNOSIS — I5032 Chronic diastolic (congestive) heart failure: Secondary | ICD-10-CM

## 2017-06-01 DIAGNOSIS — N189 Chronic kidney disease, unspecified: Secondary | ICD-10-CM | POA: Diagnosis present

## 2017-06-01 DIAGNOSIS — J1008 Influenza due to other identified influenza virus with other specified pneumonia: Principal | ICD-10-CM | POA: Diagnosis present

## 2017-06-01 DIAGNOSIS — I5043 Acute on chronic combined systolic (congestive) and diastolic (congestive) heart failure: Secondary | ICD-10-CM | POA: Diagnosis present

## 2017-06-01 DIAGNOSIS — Z7982 Long term (current) use of aspirin: Secondary | ICD-10-CM

## 2017-06-01 DIAGNOSIS — E1122 Type 2 diabetes mellitus with diabetic chronic kidney disease: Secondary | ICD-10-CM | POA: Diagnosis present

## 2017-06-01 DIAGNOSIS — N183 Chronic kidney disease, stage 3 unspecified: Secondary | ICD-10-CM | POA: Diagnosis present

## 2017-06-01 DIAGNOSIS — Z888 Allergy status to other drugs, medicaments and biological substances status: Secondary | ICD-10-CM

## 2017-06-01 DIAGNOSIS — J9600 Acute respiratory failure, unspecified whether with hypoxia or hypercapnia: Secondary | ICD-10-CM

## 2017-06-01 DIAGNOSIS — R0682 Tachypnea, not elsewhere classified: Secondary | ICD-10-CM

## 2017-06-01 DIAGNOSIS — I130 Hypertensive heart and chronic kidney disease with heart failure and stage 1 through stage 4 chronic kidney disease, or unspecified chronic kidney disease: Secondary | ICD-10-CM | POA: Diagnosis present

## 2017-06-01 DIAGNOSIS — R918 Other nonspecific abnormal finding of lung field: Secondary | ICD-10-CM

## 2017-06-01 DIAGNOSIS — R0602 Shortness of breath: Secondary | ICD-10-CM

## 2017-06-01 HISTORY — DX: Influenza due to other identified influenza virus with other respiratory manifestations: J10.1

## 2017-06-01 HISTORY — DX: Reserved for inherently not codable concepts without codable children: IMO0001

## 2017-06-01 LAB — CBG MONITORING, ED
GLUCOSE-CAPILLARY: 164 mg/dL — AB (ref 65–99)
Glucose-Capillary: 246 mg/dL — ABNORMAL HIGH (ref 65–99)
Glucose-Capillary: 195 mg/dL — ABNORMAL HIGH (ref 65–99)

## 2017-06-01 LAB — CBC
HCT: 34.6 % — ABNORMAL LOW (ref 36.0–46.0)
Hemoglobin: 11.2 g/dL — ABNORMAL LOW (ref 12.0–15.0)
MCH: 28.3 pg (ref 26.0–34.0)
MCHC: 32.4 g/dL (ref 30.0–36.0)
MCV: 87.4 fL (ref 78.0–100.0)
PLATELETS: 200 10*3/uL (ref 150–400)
RBC: 3.96 MIL/uL (ref 3.87–5.11)
RDW: 15.9 % — ABNORMAL HIGH (ref 11.5–15.5)
WBC: 7.6 10*3/uL (ref 4.0–10.5)

## 2017-06-01 LAB — BASIC METABOLIC PANEL
ANION GAP: 12 (ref 5–15)
Anion gap: 12 (ref 5–15)
BUN: 21 mg/dL — ABNORMAL HIGH (ref 6–20)
BUN: 21 mg/dL — ABNORMAL HIGH (ref 6–20)
CO2: 21 mmol/L — ABNORMAL LOW (ref 22–32)
CO2: 20 mmol/L — ABNORMAL LOW (ref 22–32)
CREATININE: 1.64 mg/dL — AB (ref 0.44–1.00)
Calcium: 8.7 mg/dL — ABNORMAL LOW (ref 8.9–10.3)
Calcium: 8.3 mg/dL — ABNORMAL LOW (ref 8.9–10.3)
Chloride: 107 mmol/L (ref 101–111)
Chloride: 105 mmol/L (ref 101–111)
Creatinine, Ser: 1.6 mg/dL — ABNORMAL HIGH (ref 0.44–1.00)
GFR, EST AFRICAN AMERICAN: 38 mL/min — AB (ref >60)
GFR, EST AFRICAN AMERICAN: 39 mL/min — AB (ref >60)
GFR, EST NON AFRICAN AMERICAN: 33 mL/min — AB (ref >60)
GFR, EST NON AFRICAN AMERICAN: 34 mL/min — AB (ref >60)
Glucose, Bld: 251 mg/dL — ABNORMAL HIGH (ref 65–99)
Glucose, Bld: 314 mg/dL — ABNORMAL HIGH (ref 65–99)
POTASSIUM: 3.9 mmol/L (ref 3.5–5.1)
Potassium: 3.9 mmol/L (ref 3.5–5.1)
SODIUM: 140 mmol/L (ref 135–145)
Sodium: 137 mmol/L (ref 135–145)

## 2017-06-01 LAB — I-STAT ARTERIAL BLOOD GAS, ED
Acid-base deficit: 2 mmol/L (ref 0.0–2.0)
Bicarbonate: 22.1 mmol/L (ref 20.0–28.0)
O2 SAT: 82 %
PCO2 ART: 39.9 mmHg (ref 32.0–48.0)
PH ART: 7.36 (ref 7.350–7.450)
PO2 ART: 54 mmHg — AB (ref 83.0–108.0)
Patient temperature: 102.7
TCO2: 23 mmol/L (ref 22–32)

## 2017-06-01 LAB — I-STAT TROPONIN, ED: Troponin i, poc: 0.01 ng/mL (ref 0.00–0.08)

## 2017-06-01 LAB — INFLUENZA PANEL BY PCR (TYPE A & B)
Influenza A By PCR: POSITIVE — AB
Influenza B By PCR: NEGATIVE

## 2017-06-01 LAB — MRSA PCR SCREENING: MRSA BY PCR: NEGATIVE

## 2017-06-01 LAB — GLUCOSE, CAPILLARY: GLUCOSE-CAPILLARY: 267 mg/dL — AB (ref 65–99)

## 2017-06-01 LAB — HIV ANTIBODY (ROUTINE TESTING W REFLEX): HIV Screen 4th Generation wRfx: NONREACTIVE

## 2017-06-01 LAB — I-STAT BETA HCG BLOOD, ED (MC, WL, AP ONLY)

## 2017-06-01 LAB — BRAIN NATRIURETIC PEPTIDE: B Natriuretic Peptide: 436.4 pg/mL — ABNORMAL HIGH (ref 0.0–100.0)

## 2017-06-01 LAB — PROCALCITONIN: PROCALCITONIN: 0.11 ng/mL

## 2017-06-01 MED ORDER — ACETAMINOPHEN 500 MG PO TABS
1000.0000 mg | ORAL_TABLET | Freq: Once | ORAL | Status: AC
  Administered 2017-06-01: 1000 mg via ORAL
  Filled 2017-06-01: qty 2

## 2017-06-01 MED ORDER — INSULIN ASPART 100 UNIT/ML ~~LOC~~ SOLN
0.0000 [IU] | Freq: Three times a day (TID) | SUBCUTANEOUS | Status: DC
  Administered 2017-06-01 – 2017-06-02 (×2): 2 [IU] via SUBCUTANEOUS
  Administered 2017-06-02: 3 [IU] via SUBCUTANEOUS
  Administered 2017-06-02: 5 [IU] via SUBCUTANEOUS
  Administered 2017-06-03: 3 [IU] via SUBCUTANEOUS
  Administered 2017-06-03 – 2017-06-04 (×3): 2 [IU] via SUBCUTANEOUS
  Administered 2017-06-04: 3 [IU] via SUBCUTANEOUS
  Administered 2017-06-04: 2 [IU] via SUBCUTANEOUS
  Administered 2017-06-05: 3 [IU] via SUBCUTANEOUS
  Administered 2017-06-05: 8 [IU] via SUBCUTANEOUS
  Filled 2017-06-01: qty 1

## 2017-06-01 MED ORDER — IPRATROPIUM-ALBUTEROL 0.5-2.5 (3) MG/3ML IN SOLN
3.0000 mL | Freq: Once | RESPIRATORY_TRACT | Status: AC
  Administered 2017-06-01: 3 mL via RESPIRATORY_TRACT
  Filled 2017-06-01: qty 3

## 2017-06-01 MED ORDER — INSULIN DETEMIR 100 UNIT/ML ~~LOC~~ SOLN
15.0000 [IU] | Freq: Every day | SUBCUTANEOUS | Status: DC
  Administered 2017-06-01 – 2017-06-03 (×3): 15 [IU] via SUBCUTANEOUS
  Filled 2017-06-01 (×3): qty 0.15

## 2017-06-01 MED ORDER — ALBUTEROL SULFATE (2.5 MG/3ML) 0.083% IN NEBU: 2.5000 mg | INHALATION_SOLUTION | RESPIRATORY_TRACT | Status: DC | PRN

## 2017-06-01 MED ORDER — ACETAMINOPHEN 650 MG RE SUPP: 650.0000 mg | Freq: Four times a day (QID) | RECTAL | Status: DC | PRN

## 2017-06-01 MED ORDER — AZITHROMYCIN 500 MG IV SOLR
500.0000 mg | Freq: Once | INTRAVENOUS | Status: AC
  Administered 2017-06-01: 500 mg via INTRAVENOUS
  Filled 2017-06-01: qty 500

## 2017-06-01 MED ORDER — AMLODIPINE BESYLATE 10 MG PO TABS
10.0000 mg | ORAL_TABLET | Freq: Every day | ORAL | Status: DC
  Administered 2017-06-01 – 2017-06-05 (×5): 10 mg via ORAL
  Filled 2017-06-01: qty 2
  Filled 2017-06-01 (×4): qty 1

## 2017-06-01 MED ORDER — VANCOMYCIN HCL IN DEXTROSE 1-5 GM/200ML-% IV SOLN
1000.0000 mg | INTRAVENOUS | Status: DC
  Administered 2017-06-01: 1000 mg via INTRAVENOUS
  Filled 2017-06-01 (×2): qty 200

## 2017-06-01 MED ORDER — ACETAMINOPHEN 325 MG PO TABS: 650.0000 mg | ORAL_TABLET | Freq: Four times a day (QID) | ORAL | Status: DC | PRN

## 2017-06-01 MED ORDER — IPRATROPIUM-ALBUTEROL 0.5-2.5 (3) MG/3ML IN SOLN
3.0000 mL | RESPIRATORY_TRACT | Status: DC
  Administered 2017-06-01: 3 mL via RESPIRATORY_TRACT
  Filled 2017-06-01: qty 3

## 2017-06-01 MED ORDER — DEXTROSE 5 % IV SOLN
2.0000 g | Freq: Once | INTRAVENOUS | Status: AC
  Administered 2017-06-01: 2 g via INTRAVENOUS
  Filled 2017-06-01: qty 2

## 2017-06-01 MED ORDER — DEXTROSE 5 % IV SOLN: 1.0000 g | INTRAVENOUS | Status: DC

## 2017-06-01 MED ORDER — CARVEDILOL 12.5 MG PO TABS
12.5000 mg | ORAL_TABLET | Freq: Two times a day (BID) | ORAL | Status: DC
  Administered 2017-06-01 – 2017-06-05 (×9): 12.5 mg via ORAL
  Filled 2017-06-01 (×9): qty 1

## 2017-06-01 MED ORDER — DEXTROSE 5 % IV SOLN
250.0000 mg | INTRAVENOUS | Status: DC
  Administered 2017-06-02 – 2017-06-03 (×2): 250 mg via INTRAVENOUS
  Filled 2017-06-01 (×2): qty 250

## 2017-06-01 MED ORDER — SODIUM CHLORIDE 0.9 % IV BOLUS (SEPSIS)
500.0000 mL | Freq: Once | INTRAVENOUS | Status: AC
  Administered 2017-06-01: 500 mL via INTRAVENOUS

## 2017-06-01 MED ORDER — LORATADINE 10 MG PO TABS: 10.0000 mg | ORAL_TABLET | Freq: Every day | ORAL | Status: DC | PRN

## 2017-06-01 MED ORDER — AZITHROMYCIN 250 MG PO TABS: 250.0000 mg | ORAL_TABLET | Freq: Every day | ORAL | Status: DC

## 2017-06-01 MED ORDER — IPRATROPIUM-ALBUTEROL 0.5-2.5 (3) MG/3ML IN SOLN
3.0000 mL | Freq: Four times a day (QID) | RESPIRATORY_TRACT | Status: DC
  Administered 2017-06-02 – 2017-06-03 (×8): 3 mL via RESPIRATORY_TRACT
  Filled 2017-06-01 (×8): qty 3

## 2017-06-01 MED ORDER — IPRATROPIUM-ALBUTEROL 0.5-2.5 (3) MG/3ML IN SOLN
3.0000 mL | Freq: Four times a day (QID) | RESPIRATORY_TRACT | Status: DC | PRN
  Administered 2017-06-01: 3 mL via RESPIRATORY_TRACT
  Filled 2017-06-01: qty 3

## 2017-06-01 MED ORDER — DEXTROSE 5 % IV SOLN: 250.0000 mg | INTRAVENOUS | Status: DC

## 2017-06-01 MED ORDER — ASPIRIN EC 81 MG PO TBEC
81.0000 mg | DELAYED_RELEASE_TABLET | Freq: Every day | ORAL | Status: DC
  Administered 2017-06-01 – 2017-06-05 (×5): 81 mg via ORAL
  Filled 2017-06-01 (×5): qty 1

## 2017-06-01 MED ORDER — ENOXAPARIN SODIUM 40 MG/0.4ML ~~LOC~~ SOLN
40.0000 mg | SUBCUTANEOUS | Status: DC
  Administered 2017-06-02 – 2017-06-04 (×3): 40 mg via SUBCUTANEOUS
  Filled 2017-06-01 (×4): qty 0.4

## 2017-06-01 MED ORDER — ATORVASTATIN CALCIUM 40 MG PO TABS
40.0000 mg | ORAL_TABLET | Freq: Every day | ORAL | Status: DC
  Administered 2017-06-01 – 2017-06-05 (×5): 40 mg via ORAL
  Filled 2017-06-01 (×6): qty 1

## 2017-06-01 MED ORDER — OSELTAMIVIR PHOSPHATE 30 MG PO CAPS
30.0000 mg | ORAL_CAPSULE | Freq: Two times a day (BID) | ORAL | Status: DC
  Administered 2017-06-01 – 2017-06-05 (×9): 30 mg via ORAL
  Filled 2017-06-01 (×10): qty 1

## 2017-06-01 MED ORDER — FUROSEMIDE 10 MG/ML IJ SOLN
40.0000 mg | Freq: Once | INTRAMUSCULAR | Status: AC
  Administered 2017-06-01: 40 mg via INTRAVENOUS
  Filled 2017-06-01: qty 4

## 2017-06-01 MED ORDER — DEXTROSE 5 % IV SOLN
1.0000 g | Freq: Once | INTRAVENOUS | Status: AC
  Administered 2017-06-01: 1 g via INTRAVENOUS
  Filled 2017-06-01: qty 10

## 2017-06-01 MED ORDER — INSULIN ASPART 100 UNIT/ML ~~LOC~~ SOLN
0.0000 [IU] | Freq: Three times a day (TID) | SUBCUTANEOUS | Status: DC
Start: 2017-06-01 — End: 2017-06-01
  Administered 2017-06-01: 3 [IU] via SUBCUTANEOUS
  Filled 2017-06-01: qty 1

## 2017-06-01 MED ORDER — SODIUM CHLORIDE 0.9 % IV SOLN
INTRAVENOUS | Status: DC
  Administered 2017-06-01: 17:00:00 via INTRAVENOUS

## 2017-06-01 MED ORDER — DEXTROSE 5 % IV SOLN
1.0000 g | INTRAVENOUS | Status: DC
  Administered 2017-06-02: 1 g via INTRAVENOUS
  Filled 2017-06-01 (×2): qty 1

## 2017-06-01 NOTE — H&P (Signed)
Date: 06/01/2017               Patient Name:  [name] MRN: [id number]  DOB: Sep 29, 1955 Age / Sex: 62 y.o., female   PCP: Melanee Spry, MD         Medical Service: Internal Medicine Teaching Service         Attending Physician: Dr. Aldine Contes, MD    First Contact: Dr. Frederico Hamman Pager: [phone]  Second Contact: Dr. Philipp Ovens Pager: [phone]       After Hours (After 5p/  First Contact Pager: [phone]  weekends / holidays): Second Contact Pager: [phone]   Chief Complaint: shortness of breath, cough  History of Present Illness: 62 yo female with PMHx of type 2 DM, HTN, and HFrEF presenting with acute onset SOB and cough. Patient states the day prior to admission she developed cough, dyspnea at rest and on exertion, nausea, subjective fevers, chills, and muscle aches. She also endorses one day of fatigue and generalized weakness. She denies diarrhea, but did have one episode of low volume emesis with associated diffuse abdominal cramping. She has had decreased PO intake and decreased urinary output. She confirms several sick contacts who had similar symptoms. Patient tried several OTC medicines without improvement in  symptoms.The patient decided to be evaluated in the ED due to her progressively worsening shortness of breath.   ED Course: Vitals: BP 154/68, pulse (!) 105, temperature 100.2 F (37.9 C), RR (!) 24, SpO2 87% on RA Labs: Cr 1.64 (basline ~1.2), BNP 436.4; WBC 7.6, Hgb 11.2 Meds: Ceftriaxone, Azithromycin  Meds:  Current Meds  Medication Sig  . amLODipine (NORVASC) 10 MG tablet Take 1 tablet (10 mg total) by mouth daily.  Marland Kitchen aspirin EC 81 MG tablet Take 1 tablet (81 mg total) by mouth daily. IM [facility]  . atorvastatin (LIPITOR) 40 MG tablet Take 1 tablet (40 mg total) by mouth daily. IM program  . BAYER MICROLET LANCETS lancets Check blood sugar three times a day. ICD 10: E11.51  . blood glucose meter kit and supplies Check blood sugar two  times a day  . Blood Glucose Monitoring Suppl (CONTOUR NEXT EZ MONITOR) w/Device KIT Check blood sugar three times a day. ICD 10: E11.51  . carvedilol (COREG) 12.5 MG tablet Take 1 tablet (12.5 mg total) by mouth 2 (two) times daily with a meal. IM [facility]  . diclofenac sodium (VOLTAREN) 1 % GEL Apply 2 g topically 4 (four) times daily. (Patient taking differently: Apply 2 g topically 4 (four) times daily as needed (pain). )  . glucose blood (BAYER CONTOUR NEXT TEST) test strip Check blood sugar three times a day. ICD 10: E11.51  . insulin detemir (LEVEMIR) 100 UNIT/ML injection Inject 0.2 mLs (20 Units total) into the skin at bedtime. IM program, [facility]  . Insulin Syringe-Needle U-100 31G X 15/64" 0.3 ML MISC Use to take insulin one time a day  . loratadine (CLARITIN) 10 MG tablet Take 1 tablet (10 mg total) by mouth daily. (Patient taking differently: Take 10 mg by mouth daily as needed for allergies. )  . losartan (COZAAR) 100 MG tablet Take 1 tablet (100 mg total) by mouth daily.  . sitaGLIPtin-metformin (JANUMET) 50-1000 MG tablet Take 1 tablet by mouth 2 (two) times daily with a meal. IM program (Patient taking differently: Take 2 tablets by mouth 2 (two) times daily with a meal. IM program)  . triamcinolone cream (KENALOG) 0.1 % Apply  1 application topically daily as needed for irritation.    Allergies: Allergies as of 06/01/2017 - Review Complete 06/01/2017  Allergen Reaction Noted  . Plavix [clopidogrel bisulfate] Rash 12/21/2012   Past Medical History:  Diagnosis Date  . Arthritis   . CHF (congestive heart failure) ([place])    2014...@ [facility]  . Diabetes mellitus    diagnosed 2001  . Hypertension   . PVD (peripheral vascular disease) ([place])     Family History:  Family History  Problem Relation Age of Onset  . Diabetes Mother    Social History:  Social History   Tobacco Use  . Smoking status: Never Smoker  . Smokeless tobacco: Never Used  Substance Use Topics    . Alcohol use: No    Alcohol/week: 0.0 oz    Comment: ocassionally  . Drug use: No    Review of Systems: A complete ROS was negative except as per HPI.   Physical Exam: Blood pressure (!) 123/92, pulse 89, temperature 100.2 F (37.9 C), temperature source Oral, resp. rate 17, height _0  (1.6 m), weight 172 lb (78 kg), SpO2 95 %. Physical Exam  Constitutional: She is oriented to person, place, and time. She appears well-developed and well-nourished. No distress.  HENT:  Head: Normocephalic and atraumatic.  Mouth/Throat: Mucous membranes are dry.  Eyes: Conjunctivae and EOM are normal. No scleral icterus.  Neck: Normal range of motion. Neck supple. No JVD present.  Cardiovascular: Regular rhythm, normal heart sounds and intact distal pulses. Tachycardia present.  Pulmonary/Chest: Breath sounds normal. Tachypnea noted. She has no decreased breath sounds. She has no wheezes. She has no rhonchi. She has no rales.  Patient with tachypnea, but able to talk in full sentences.   Abdominal: Soft. Bowel sounds are normal. There is no tenderness.  Musculoskeletal: She exhibits no edema.  Neurological: She is alert and oriented to person, place, and time.  Skin: Skin is warm and dry.  Psychiatric: She has a normal mood and affect.    EKG: personally reviewed my interpretation is sinus tachycardia  CXR: personally reviewed my interpretation is right greater than left perihilar infiltrate, no pleural effusions   Assessment & Plan by Problem: Principal Problem:   Community acquired pneumonia Active Problems:   HTN (hypertension)   CKD (chronic kidney disease)   Chronic combined systolic and diastolic CHF, NYHA class 2 (HCC)   DM (diabetes mellitus) type II uncontrolled, periph vascular disorder ([place])  Influenza  Patient presenting with acute onset cough, SOB, fatigue and subjective fevers. Patient tachycardic, tachypnic, and hypoxic requiring 2L supplemental O2 (new oxygen  requirement). CXR with perihilar infiltrate concerning for pneumonia. Received 1 g ceftriazone and 500 mg of IV azithromycin in ED. Was initially going to continue treatment for CAP, but flu panel was positive for influenza A.  -Tamiflu 30 mg BID -D/c Ceftriaxone 1 g daily  -D/c Azithromycin 250 mg PO daily  -Blood cx pending  -Will give 500 cc bolus  -Tylenol PRN for fever  AKI on CKD Creatinine 1.64 increased from baseline of 1.2. Likely pre-renal in setting of CAP and decreased PO intake, patient appeared dry on examination. -500 cc bolus of NS -Repeat BMET  Type 2 DM Most recent A1C 6.5. Home DM regimen includes: Levemir 15 units and Janumet BID. -Continue Levemir 15 units qhs -SSI - sensitive  -CBG monitoring   HTN BP elevated on admission. Patients BP med regimen includes: Amlodipine 10 mg, Losartan 100 mg, and Carvedilol 12.5 mg BID. -Will continue  home medications   Chronic, combined systolic and diastolic CHF  ECHO in 01/3240 with EF 30-35%, with diffuse hypokinesis and G2DD. The patient had ECHO in 10/2016 with interval  improvement of EF to 57%, G2DD. BNP elevated to 436.4, but no prior to compare. Patient appears euvolemic on examination.  -Continue carvedilol 12.5 mg BID   Dispo: Admit patient to Observation with expected length of stay less than 2 midnights.  Signed: Melanee Spry, MD 06/01/2017, 6:43 AM  Pager: [phone]

## 2017-06-01 NOTE — ED Notes (Signed)
All  Drs in room now

## 2017-06-01 NOTE — Progress Notes (Signed)
Pharmacy Antibiotic Note  [name] is a 62 y.o. female admitted on 06/01/2017 with pneumonia.  Pharmacy has been consulted for vancomycin and cefepime dosing. Tmax is 102.7 and WBC is WNL. Scr is elevated above baseline at 1.6. Pt with Flu A positive and started on tamiflu. CXR reported with pneumonia.   Plan: Vancomycin 1gm IV Q24H Cefepime 2gm IV x 1 then 1gm IV Q24H F/u renal fxn, C&S, clinical status and trough at SS  Height: 5\' 3"  (160 cm) Weight: 172 lb (78 kg) IBW/kg (Calculated) : 52.4  Temp (24hrs), Avg:100.6 F (38.1 C), Min:99.7 F (37.6 C), Max:102.7 F (39.3 C)  Recent Labs  Lab 06/01/17 0157 06/01/17 0502  WBC 7.6  --   CREATININE 1.64* 1.60*    Estimated Creatinine Clearance: 37 mL/min (A) (by C-G formula based on SCr of 1.6 mg/dL (H)).    Allergies  Allergen Reactions  . Plavix [Clopidogrel Bisulfate] Rash    Antimicrobials this admission: Vanc 1/31>> Cefepime 1/31>>  Dose adjustments this admission: N/A  Microbiology results: Pending Thank you for allowing pharmacy to be a part of this patient's care.  Kwadwo Taras, Rande Lawman 06/01/2017 3:51 PM

## 2017-06-01 NOTE — ED Triage Notes (Signed)
Pt states that since yesterday she has been SOB. Hx of CHF, speaks in short sentences. Some  [place]. Pt has had a nonproductive cough, sneezing, chills.

## 2017-06-01 NOTE — ED Notes (Signed)
Carb Mortified Heart Healthy Diet was ordered for Breakfast.

## 2017-06-01 NOTE — ED Notes (Signed)
Dr ,after being paged, called me back and I  requested breathing txs prn  Due to pt wheezing after walking to [place],  she said she would

## 2017-06-01 NOTE — Progress Notes (Signed)
Subjective:  No acute events overnight. Patient reports feeling better this morning. Denies shortness of breath, cough and abdominal pain. Does endorse nausea. No emesis. Discussed with patient will continue treatment for influenza and wean her oxygen. Patient voiced understanding and is in agreement with plan. All questions answered.   Objective:  Vital signs in last 24 hours: Vitals:   06/01/17 0500 06/01/17 0515 06/01/17 0545 06/01/17 0600  BP: (!) 116/49 (!) 128/59 136/68 (!) 123/92  Pulse: 97 94 93 89  Resp: (!) 31  (!) 31 17  Temp:      TempSrc:      SpO2: 92%  (!) 88% 95%  Weight:      Height:       Physical Exam  Constitutional: She is oriented to person, place, and time. She appears well-developed and well-nourished. No distress.  Appears comfortable and in no respiratory distress while on supplemental oxygen   Cardiovascular: Normal rate, regular rhythm, normal heart sounds and intact distal pulses. Exam reveals no gallop and no friction rub.  No murmur heard. Pulmonary/Chest:  Diffuse mild wheezes, no crackles, no increased WOB while on 2L   Abdominal: Soft. Bowel sounds are normal. She exhibits no distension. There is no tenderness.  Musculoskeletal: She exhibits no edema.  Neurological: She is alert and oriented to person, place, and time.  Skin: Skin is warm. She is not diaphoretic.    Assessment/Plan:  Principal Problem:   Community acquired pneumonia Active Problems:   HTN (hypertension)   CKD (chronic kidney disease)   Chronic combined systolic and diastolic CHF, NYHA class 2 (HCC)   DM (diabetes mellitus) type II uncontrolled, periph vascular disorder ([place])  # Influenza A  Patient presented with several days of flulike symptoms and oxygen requirements.  Tested positive for influenza A and started on Tamiflu. She is feeling better this morning. She appears comfortable and in no respiratory distress when seen, but remains on 2 L of supplemental oxygen.  No  pulse ox at bedside to determine oxygenation status.  Will order continuous pulse ox to monitor respiratory status throughout the day.  She will be stable for discharge once able to oxygenate appropriately on room air and ambulate without requiring supplemental oxygen. Will discontinue antibiotic therapy as very low suspicion for bacterial infection at this time. Can consider restarting if patient decompensates.  - Discontinue Rocephin and azithromycin  - Continue Tamiflu 30 mg BID x 4 days (renally dosed) - Continuous pulse ox - Wean oxygen as tolerated - Ambulatory O2 sats once on room air   # AKI on CKD In the setting of poor PO intake for the past several days. S/p 500 cc bolus with no improvement in renal function. SCr 1.6, same as on presentation. Will hydrate gently as below and continue to monitor renal function and volume status.  - NS @ 75 cc/hr x 12 hours  - BMP in AM  - Avoid nephrotoxic agents   # HTN: Initially hypertensive on admission. Now normotensive while on home meds. Patients BP med regimen includes: Amlodipine 10 mg, Losartan 100 mg, and Carvedilol 12.5 mg BID. - Will continue home medications  # Insulin-dependent T2DM: Most recent A1C 6.5. Home DM regimen includes: Levemir 15 units and Janumet BID. CBGs 200-300s - Continue Levemir 15 units qhs - SSI sensitive --> moderate  - CBG monitoring   # Chronic combined systolic and diastolic HF: ECHO in 10/7891 with EF 30-35%, with diffuse hypokinesis and G2DD. The patient had ECHO in  10/2016 with interval  improvement of EF to 57%, G2DD. BNP elevated to 436.4, but no prior to compare. Patient appears euvolemic on examination.  - Continue carvedilol 12.5 mg BID   Dispo: Anticipated discharge in approximately 1-3 day(s).   Welford Roche, MD 06/01/2017, 6:40 AM Pager: [phone]

## 2017-06-01 NOTE — ED Provider Notes (Signed)
TIME SEEN: 3:24 AM  CHIEF COMPLAINT: Chills, cough, shortness of breath  HPI: Patient is a 62 year old female with history of hypertension, diabetes, CHF who presents to the emergency department with 2 days of subjective fevers, chills, nonproductive cough.  She did have an influenza vaccination this year.  Does not wear oxygen at home.  No chest pain or chest discomfort except with coughing.  No lower extremity swelling or pain.  No vomiting or diarrhea.  ROS: See HPI Constitutional:  fever  Eyes: no drainage  ENT: no runny nose   Cardiovascular: Chest pain with coughing Resp:  SOB  GI: no vomiting GU: no dysuria Integumentary: no rash  Allergy: no hives  Musculoskeletal: no leg swelling  Neurological: no slurred speech ROS otherwise negative  PAST MEDICAL HISTORY/PAST SURGICAL HISTORY:  Past Medical History:  Diagnosis Date  . Arthritis   . CHF (congestive heart failure) ([place])    2014...@ [facility]  . Diabetes mellitus    diagnosed 2001  . Hypertension   . PVD (peripheral vascular disease) ([place])     MEDICATIONS:  Prior to Admission medications   Medication Sig Start Date End Date Taking? Authorizing Provider  amLODipine (NORVASC) 10 MG tablet Take 1 tablet (10 mg total) by mouth daily. 12/14/16   Sid Falcon, MD  aspirin EC 81 MG tablet Take 1 tablet (81 mg total) by mouth daily. [facility] 04/22/16   Norman Herrlich, MD  atorvastatin (LIPITOR) 40 MG tablet Take 1 tablet (40 mg total) by mouth daily. IM program 01/26/17   Melanee Spry, MD  BAYER MICROLET LANCETS lancets Check blood sugar three times a day. ICD 10: E11.51 06/16/15   Corky Sox, MD  blood glucose meter kit and supplies Check blood sugar two times a day 05/25/16   Norman Herrlich, MD  Blood Glucose Monitoring Suppl (CONTOUR NEXT EZ MONITOR) w/Device KIT Check blood sugar three times a day. ICD 10: E11.51 06/16/15   Corky Sox, MD  carvedilol (COREG) 12.5 MG tablet Take 1 tablet (12.5 mg  total) by mouth 2 (two) times daily with a meal. IM program[facility] 01/26/17   Melanee Spry, MD  diclofenac sodium (VOLTAREN) 1 % GEL Apply 2 g topically 4 (four) times daily. 01/26/17   Lacroce, Hulen Shouts, MD  glucose blood (BAYER CONTOUR NEXT TEST) test strip Check blood sugar three times a day. ICD 10: E11.51 01/26/17   Lacroce, Hulen Shouts, MD  insulin detemir (LEVEMIR) 100 UNIT/ML injection Inject 0.2 mLs (20 Units total) into the skin at bedtime. IM program[facility] 05/25/17   Melanee Spry, MD  Insulin Syringe-Needle U-100 31G X 15/64" 0.3 ML MISC Use to take insulin one time a day 05/15/17   Lacroce, Hulen Shouts, MD  loratadine (CLARITIN) 10 MG tablet Take 1 tablet (10 mg total) by mouth daily. Patient taking differently: Take 10 mg by mouth daily as needed for allergies.  06/29/16 06/29/17  Norman Herrlich, MD  losartan (COZAAR) 100 MG tablet Take 1 tablet (100 mg total) by mouth daily. 12/16/16 03/16/17  [name], MD  sitaGLIPtin-metformin (JANUMET) 50-1000 MG tablet Take 1 tablet by mouth 2 (two) times daily with a meal. IM program Patient taking differently: Take 2 tablets by mouth 2 (two) times daily with a meal. IM program 10/20/16   Norman Herrlich, MD  triamcinolone cream (KENALOG) 0.1 % Apply 1 application topically daily as needed for irritation. 05/10/16   [provider]    ALLERGIES:  Allergies  Allergen Reactions  . Plavix [Clopidogrel Bisulfate] Rash    SOCIAL HISTORY:  Social History   Tobacco Use  . Smoking status: Never Smoker  . Smokeless tobacco: Never Used  Substance Use Topics  . Alcohol use: No    Alcohol/week: 0.0 oz    Comment: ocassionally    FAMILY HISTORY: Family History  Problem Relation Age of Onset  . Diabetes Mother     EXAM: BP (!) 154/68 (BP Location: Right Arm)   Pulse (!) 105   Temp 100.2 F (37.9 C) (Oral)   Resp (!) 24   SpO2 91%  CONSTITUTIONAL: Alert and oriented and responds appropriately to questions.  Well-appearing; well-nourished HEAD: Normocephalic EYES: Conjunctivae clear, pupils appear equal, EOMI ENT: normal nose; moist mucous membranes NECK: Supple, no meningismus, no nuchal rigidity, no LAD  CARD: RRR; S1 and S2 appreciated; no murmurs, no clicks, no rubs, no gallops RESP: Normal chest excursion without splinting or tachypnea; breath sounds clear and equal bilaterally; no wheezes, no rhonchi, no rales, no respiratory distress, speaking full sentences, patient is hypoxic on room air at rest with an oxygen saturation of 86% ABD/GI: Normal bowel sounds; non-distended; soft, non-tender, no rebound, no guarding, no peritoneal signs, no hepatosplenomegaly BACK:  The back appears normal and is non-tender to palpation, there is no CVA tenderness EXT: Normal ROM in all joints; non-tender to palpation; no edema; normal capillary refill; no cyanosis, no calf tenderness or swelling    SKIN: Normal color for age and race; warm; no rash NEURO: Moves all extremities equally PSYCH: The patient's mood and manner are appropriate. Grooming and personal hygiene are appropriate.  MEDICAL DECISION MAKING: Patient here with low-grade fever of 100.2, oxygen requirement, cough.  Does not appear volume overloaded on exam.  A chest x-ray shows bilateral infiltrates concerning for pneumonia.  Given her new oxygen requirement I feel she will need to the hospital.  PCP is with internal medicine at [facility].  Will give ceftriaxone and azithromycin.  She is doing well on 2 L nasal cannula.  Will discuss with internal medicine resident.  Doubt ACS, PE, dissection.  Troponin is negative.  Labs otherwise unremarkable.  Will obtain blood cultures and flu swab.    3:42 AM Discussed patient's case with IM resident, Dr. Benjamine Mola.  I have recommended admission and patient (and family if present) agree with this plan. Admitting physician will place admission orders.   I reviewed all nursing notes, vitals, pertinent previous records,  EKGs, lab and urine results, imaging (as available).     EKG Interpretation  Date/Time:  Thursday June 01 2017 01:55:58 EST Ventricular Rate:  105 PR Interval:  158 QRS Duration: 96 QT Interval:  362 QTC Calculation: 478 R Axis:   28 Text Interpretation:  Sinus tachycardia Septal infarct , age undetermined ST & T wave abnormality, consider lateral ischemia Abnormal ECG No significant change since last tracing Confirmed by Pryor Curia [phone]) on 06/01/2017 3:24:34 AM         Ward, Delice Bison, DO 06/01/17 0400

## 2017-06-01 NOTE — ED Notes (Signed)
Carb Mortified Diet ordered for Dinner. 

## 2017-06-01 NOTE — ED Notes (Signed)
Admitting drs into see pt, breakfast ordered, remains on  Droplet precautions

## 2017-06-01 NOTE — ED Notes (Signed)
Pt up to eat lunch, sleeping no c/o discomfort

## 2017-06-01 NOTE — ED Notes (Signed)
Dr. Dareen Piano paged to [phone]-per RN

## 2017-06-01 NOTE — ED Notes (Signed)
Pt to room with O2 of 87%. Put pt on 2L [place]. Notified MD.

## 2017-06-01 NOTE — Consult Note (Signed)
Name: [name] MRN: [id number] DOB: 1955-07-03    ADMISSION DATE:  06/01/2017 CONSULTATION DATE:  06/01/2017  REFERRING MD :  Fredric Dine  CHIEF COMPLAINT:  SOB, AMS and Hypoxemia  BRIEF PATIENT DESCRIPTION: 62 year old female with PMH of CHF, DM and HTN who presents to [facility] ED with DOE.  In the ED she was hypoxemic and was started on 6L [place] and was able to have sat of 95%.  Patient was admitted by IMTS but had an acute change in mental status and became hypoxemic requiring 100% NRB after a bathroom trip.  PCCM was consulted.  Upon evaluating the patient, she is on 6L [place], reports feeling better.  CXR was repeated that I reviewed myself that revealed worsening infiltrate vs pulmonary edema.  A single dose of lasix was given and vanc/cefepime were added.  Tamiflu 30 BID given.  Patient reports cough with sputum production and SOB.  Subjective fever.  No productive cough started yesterday.  Sick contact of grand son's father.  SIGNIFICANT EVENTS  1/31 admission to the hospital  STUDIES:  CXR 1/31 bilateral infiltrate   HISTORY OF PRESENT ILLNESS:  62 year old female with PMH of CHF, DM and HTN who presents to [facility] ED with DOE.  In the ED she was hypoxemic and was started on 6L [place] and was able to have sat of 95%.  Patient was admitted by IMTS but had an acute change in mental status and became hypoxemic requiring 100% NRB after a bathroom trip.  PCCM was consulted.  Upon evaluating the patient, she is on 6L [place], reports feeling better.  CXR was repeated that I reviewed myself that revealed worsening infiltrate vs pulmonary edema.  A single dose of lasix was given and vanc/cefepime were added.  Tamiflu 30 BID given.  Patient reports cough with sputum production and SOB.  Subjective fever.  No productive cough started yesterday.  Sick contact of grand son's father.  PAST MEDICAL HISTORY :   has a past medical history of Arthritis, CHF (congestive heart failure) ([place]), Diabetes mellitus,  Hypertension, and PVD (peripheral vascular disease) ([place]).  has a past surgical history that includes Abdominal angiogram (12/28/2012); Tubal ligation; Total hip arthroplasty (Left, 08/19/2013); abdominal angiogram (N/A, 12/29/2011); and abdominal aortagram (N/A, 12/21/2012). Prior to Admission medications   Medication Sig Start Date End Date Taking? Authorizing Provider  amLODipine (NORVASC) 10 MG tablet Take 1 tablet (10 mg total) by mouth daily. 12/14/16  Yes Sid Falcon, MD  aspirin EC 81 MG tablet Take 1 tablet (81 mg total) by mouth daily. IM program, [facility] 04/22/16  Yes Norman Herrlich, MD  atorvastatin (LIPITOR) 40 MG tablet Take 1 tablet (40 mg total) by mouth daily. IM program 01/26/17  Yes Lacroce, Hulen Shouts, MD  BAYER MICROLET LANCETS lancets Check blood sugar three times a day. ICD 10: E11.51 06/16/15  Yes Corky Sox, MD  blood glucose meter kit and supplies Check blood sugar two times a day 05/25/16  Yes Norman Herrlich, MD  Blood Glucose Monitoring Suppl (CONTOUR NEXT EZ MONITOR) w/Device KIT Check blood sugar three times a day. ICD 10: E11.51 06/16/15  Yes Corky Sox, MD  carvedilol (COREG) 12.5 MG tablet Take 1 tablet (12.5 mg total) by mouth 2 (two) times daily with a meal. IM program, [facility] 01/26/17  Yes Lacroce, Hulen Shouts, MD  diclofenac sodium (VOLTAREN) 1 % GEL Apply 2 g topically 4 (four) times daily. Patient taking differently:  Apply 2 g topically 4 (four) times daily as needed (pain).  01/26/17  Yes Lacroce, Hulen Shouts, MD  glucose blood (BAYER CONTOUR NEXT TEST) test strip Check blood sugar three times a day. ICD 10: E11.51 01/26/17  Yes Lacroce, Hulen Shouts, MD  insulin detemir (LEVEMIR) 100 UNIT/ML injection Inject 0.2 mLs (20 Units total) into the skin at bedtime. IM program, [facility] 05/25/17  Yes Lacroce, Hulen Shouts, MD  Insulin Syringe-Needle U-100 31G X 15/64" 0.3 ML MISC Use to take insulin one time a day 05/15/17  Yes Lacroce, Hulen Shouts, MD  loratadine  (CLARITIN) 10 MG tablet Take 1 tablet (10 mg total) by mouth daily. Patient taking differently: Take 10 mg by mouth daily as needed for allergies.  06/29/16 06/29/17 Yes Norman Herrlich, MD  losartan (COZAAR) 100 MG tablet Take 1 tablet (100 mg total) by mouth daily. 12/16/16 06/01/17 Yes [name], MD  sitaGLIPtin-metformin (JANUMET) 50-1000 MG tablet Take 1 tablet by mouth 2 (two) times daily with a meal. IM program Patient taking differently: Take 2 tablets by mouth 2 (two) times daily with a meal. IM program 10/20/16  Yes Norman Herrlich, MD  triamcinolone cream (KENALOG) 0.1 % Apply 1 application topically daily as needed for irritation. 05/10/16  Yes [provider]   Allergies  Allergen Reactions  . Plavix [Clopidogrel Bisulfate] Rash    FAMILY HISTORY:  family history includes Diabetes in her mother. SOCIAL HISTORY:  reports that  has never smoked. she has never used smokeless tobacco. She reports that she does not drink alcohol or use drugs.  REVIEW OF SYSTEMS:   Constitutional: Negative for fever, chills, weight loss, malaise/fatigue and diaphoresis.  HENT: Negative for hearing loss, ear pain, nosebleeds, congestion, sore throat, neck pain, tinnitus and ear discharge.   Eyes: Negative for blurred vision, double vision, photophobia, pain, discharge and redness.  Respiratory: Negative for cough, hemoptysis, sputum production, shortness of breath, wheezing and stridor.   Cardiovascular: Negative for chest pain, palpitations, orthopnea, claudication, leg swelling and PND.  Gastrointestinal: Negative for heartburn, nausea, vomiting, abdominal pain, diarrhea, constipation, blood in stool and melena.  Genitourinary: Negative for dysuria, urgency, frequency, hematuria and flank pain.  Musculoskeletal: Negative for myalgias, back pain, joint pain and falls.  Skin: Negative for itching and rash.  Neurological: Negative for dizziness, tingling, tremors, sensory change, speech change,  focal weakness, seizures, loss of consciousness, weakness and headaches.  Endo/Heme/Allergies: Negative for environmental allergies and polydipsia. Does not bruise/bleed easily.  SUBJECTIVE:   VITAL SIGNS: Temp:  [99.7 F (37.6 C)-102.7 F (39.3 C)] 102.7 F (39.3 C) (01/31 1543) Pulse Rate:  [83-105] 99 (01/31 1557) Resp:  [17-32] 31 (01/31 1557) BP: (109-157)/(49-92) 125/57 (01/31 1024) SpO2:  [80 %-98 %] 91 % (01/31 1557) FiO2 (%):  [100 %] 100 % (01/31 1503) Weight:  [78 kg (172 lb)] 78 kg (172 lb) (01/31 0415)  PHYSICAL EXAMINATION: General:  Acute on chronically ill appearing female, mild respiratory distress Neuro:  Alert and interactive, moving all ext to command HEENT:  [place]/AT, PERRL, EOM-I and MMM Cardiovascular:  RRR, Nl S1/S2 and -M/R/G. Lungs:  Bibasilar crackles Abdomen:  Soft, NT, ND and +BS Musculoskeletal:  -edema and -tenderness Skin:  Intact  Recent Labs  Lab 06/01/17 0157 06/01/17 0502  NA 140 137  K 3.9 3.9  CL 107 105  CO2 21* 20*  BUN 21* 21*  CREATININE 1.64* 1.60*  GLUCOSE 251* 314*   Recent Labs  Lab 06/01/17 0157  HGB 11.2*  HCT 34.6*  WBC 7.6  PLT 200   Dg Chest 2 View  Result Date: 06/01/2017 CLINICAL DATA:  Cough and shortness of breath tonight. EXAM: CHEST  2 VIEW COMPARISON:  08/13/2013 FINDINGS: Heart size and pulmonary vascularity are normal. Linear scarring in the left mid lung and right lower lung without change since prior study. Interval development of right greater than left perihilar infiltration. This likely represents pneumonia. Edema less likely. No pleural effusions. No pneumothorax. Mediastinal contours appear intact. Degenerative changes in the spine. IMPRESSION: New perihilar infiltrates, greater on the right likely representing pneumonia. Electronically Signed   By: Lucienne Capers M.D.   On: 06/01/2017 02:15   Dg Chest Portable 1 View  Result Date: 06/01/2017 CLINICAL DATA:  Dyspnea with wheezing. EXAM: PORTABLE  CHEST 1 VIEW COMPARISON:  Radiographs of June 01, 2016. FINDINGS: Stable cardiomediastinal silhouette. No pneumothorax is noted. Worsening bilateral perihilar and basilar airspace opacities are noted concerning for worsening pneumonia or possibly edema. No pneumothorax or pleural effusion is noted. Bony thorax is unremarkable. IMPRESSION: Significantly worsening bilateral perihilar and basilar opacities concerning for worsening pneumonia or edema. Electronically Signed   By: Marijo Conception, M.D.   On: 06/01/2017 15:47   I reviewed CXR myself, worsening pulmonary edema noted.  ASSESSMENT / PLAN:  62 year old female with CHF and DM who presents with SOB and hypoxemia.  Found altered.  DOE: likely CHF on top of flu A  - Strict I/O  - Agree with lasix and keep dry  - Treat flu  Pulmonary infiltrate: pulmonary edema and flu/infiltrate combo  - Diureses  - Abx  Pneumonia:  - Add zithromax  - F/u on culture  - Check procalcitonin  CHF:  - Lasix  - Strict I/O  - Home medications as ordered  Hypoxemia:  - Diureses  - Titrate O2 for sat of 88-92%  Ok to admit to SDU, PCCM will be available PRN if patient worsens.  Rush Farmer, M.D. [facility] Pulmonary/Critical Care Medicine. Pager: [phone]. After hours pager: [phone].  06/01/2017, 4:44 PM

## 2017-06-01 NOTE — Progress Notes (Signed)
Paged to bedside that patient had worsening oxygen requirement requiring 100% re-breather to maintain oxygen saturation.   Presented to bedside to evaluate the patient. She is lethargic and difficult to arouse. She states that her breathing became acutely worse after using the restroom without her oxygen. She continues to endorse dyspnea and states she feels worse compared to earlier.   PE:  Patient's oxygen saturations at 90-93% on 6L/min [place]  T 102.7 F  General: Lethargic female initially on NBR Pulm: Bibasilar crackles with poor air movement  CV: RRR, no murmurs, no rubs  Extremities: No LE edema   Plan: CXR preformed showing worsening multifocal PNA vs pulmonary edema. She was started on broad spectrum Abx for possible staph pneumonia given her influenza. ABG showing hypoxia. - Started on Vanc/Cefepime  - Given 1 time dose of IV furosemide 40 mg  - Schedule breathing treatments every 4 hours while awake  - Admit to stepdown  - PCCM consulted

## 2017-06-01 NOTE — ED Notes (Signed)
Patient CBG was 164, Nurse was informed.

## 2017-06-01 NOTE — ED Notes (Signed)
Dr called due to pt being so sleepy and weak, needing breathing tx, Charge nurse called to move pt to another nbed

## 2017-06-01 NOTE — ED Notes (Signed)
Carb Mortified Heart Healthy Diet ordered for Lunch.

## 2017-06-01 NOTE — ED Notes (Signed)
Admitting providers at bedside

## 2017-06-01 NOTE — ED Notes (Signed)
Patient CBG was 246 the Nurse was informed.

## 2017-06-01 NOTE — ED Notes (Addendum)
Per resp who came in to give tx pt o2 bumped up to 3 l [place],  Pt finished breakfast

## 2017-06-01 NOTE — Progress Notes (Signed)
Call received from Dr. Aggie Hacker regarding [name]'s urine output after receiving lasix.  300 mL of urine noted in purewick cannister.  Pt denies sx of bladder distention or difficulty urinating.  Bladder scan not performed at this time.  Will continue to monitor.  [name]

## 2017-06-02 ENCOUNTER — Observation Stay (HOSPITAL_COMMUNITY): Payer: Self-pay

## 2017-06-02 ENCOUNTER — Other Ambulatory Visit: Payer: Self-pay

## 2017-06-02 DIAGNOSIS — J9601 Acute respiratory failure with hypoxia: Secondary | ICD-10-CM

## 2017-06-02 DIAGNOSIS — J1000 Influenza due to other identified influenza virus with unspecified type of pneumonia: Secondary | ICD-10-CM

## 2017-06-02 DIAGNOSIS — I5042 Chronic combined systolic (congestive) and diastolic (congestive) heart failure: Secondary | ICD-10-CM

## 2017-06-02 HISTORY — DX: Acute respiratory failure with hypoxia: J96.01

## 2017-06-02 LAB — RAPID URINE DRUG SCREEN, HOSP PERFORMED
AMPHETAMINES: NOT DETECTED
BENZODIAZEPINES: NOT DETECTED
Barbiturates: NOT DETECTED
COCAINE: NOT DETECTED
OPIATES: NOT DETECTED
Tetrahydrocannabinol: NOT DETECTED

## 2017-06-02 LAB — CBC
HCT: 33.4 % — ABNORMAL LOW (ref 36.0–46.0)
HEMOGLOBIN: 10.8 g/dL — AB (ref 12.0–15.0)
MCH: 28.3 pg (ref 26.0–34.0)
MCHC: 32.3 g/dL (ref 30.0–36.0)
MCV: 87.7 fL (ref 78.0–100.0)
Platelets: 171 10*3/uL (ref 150–400)
RBC: 3.81 MIL/uL — ABNORMAL LOW (ref 3.87–5.11)
RDW: 16.2 % — AB (ref 11.5–15.5)
WBC: 4.4 10*3/uL (ref 4.0–10.5)

## 2017-06-02 LAB — GLUCOSE, CAPILLARY
GLUCOSE-CAPILLARY: 209 mg/dL — AB (ref 65–99)
GLUCOSE-CAPILLARY: 152 mg/dL — AB (ref 65–99)
GLUCOSE-CAPILLARY: 133 mg/dL — AB (ref 65–99)
Glucose-Capillary: 142 mg/dL — ABNORMAL HIGH (ref 65–99)

## 2017-06-02 LAB — BASIC METABOLIC PANEL
ANION GAP: 10 (ref 5–15)
BUN: 15 mg/dL (ref 6–20)
CHLORIDE: 104 mmol/L (ref 101–111)
CO2: 24 mmol/L (ref 22–32)
Calcium: 8.3 mg/dL — ABNORMAL LOW (ref 8.9–10.3)
Creatinine, Ser: 1.54 mg/dL — ABNORMAL HIGH (ref 0.44–1.00)
GFR calc Af Amer: 41 mL/min — ABNORMAL LOW (ref >60)
GFR, EST NON AFRICAN AMERICAN: 36 mL/min — AB (ref >60)
GLUCOSE: 192 mg/dL — AB (ref 65–99)
POTASSIUM: 3.3 mmol/L — AB (ref 3.5–5.1)
Sodium: 138 mmol/L (ref 135–145)

## 2017-06-02 LAB — ECHOCARDIOGRAM COMPLETE
Height: 63 in
WEIGHTICAEL: 2828.94 [oz_av]

## 2017-06-02 LAB — PROCALCITONIN: PROCALCITONIN: 0.16 ng/mL

## 2017-06-02 LAB — STREP PNEUMONIAE URINARY ANTIGEN: Strep Pneumo Urinary Antigen: NEGATIVE

## 2017-06-02 MED ORDER — FUROSEMIDE 10 MG/ML IJ SOLN
40.0000 mg | Freq: Once | INTRAMUSCULAR | Status: AC
  Administered 2017-06-02: 40 mg via INTRAVENOUS
  Filled 2017-06-02: qty 4

## 2017-06-02 NOTE — Consult Note (Signed)
Name: [name] MRN: [id number] DOB: [birth year]    ADMISSION DATE:  06/01/2017 CONSULTATION DATE:  06/01/2017  REFERRING MD :  Fredric Dine  CHIEF COMPLAINT:  SOB, AMS and Hypoxemia  BRIEF PATIENT DESCRIPTION: 62 year old female with PMH of CHF, DM and HTN who presents to [facility] ED with DOE.  In the ED she was hypoxemic and was started on 6L [place] and was able to have sat of 95%.  Patient was admitted by IMTS but had an acute change in mental status and became hypoxemic requiring 100% NRB after a bathroom trip.  PCCM was consulted.  Upon evaluating the patient, she is on 6L , reports feeling better.  CXR was repeated that I reviewed myself that revealed worsening infiltrate vs pulmonary edema.  A single dose of lasix was given and vanc/cefepime were added.  Tamiflu 30 BID given.  Patient reports cough with sputum production and SOB.  Subjective fever.  No productive cough started yesterday.  Sick contact of grand son's father. Had EF 35% in 2015 that increaed to 60% in 2018  SIGNIFICANT EVENTS  1/31 admission to the hospital -CXR 1/31 bilateral infiltrate   SUBJECTIVE/OVERNIGHT/INTERVAL HX 2/1 - wosening hypoxemia overnight per RN. Now on NRB. Patient feels roughly the same   VITAL SIGNS: Temp:  [97.9 F (36.6 C)-102.7 F (39.3 C)] 100 F (37.8 C) (02/01 0737) Pulse Rate:  [83-102] 85 (02/01 0737) Resp:  [20-31] 22 (02/01 0737) BP: (121-167)/(57-83) 158/75 (02/01 0737) SpO2:  [80 %-98 %] 97 % (02/01 0737) FiO2 (%):  [55 %-100 %] 55 % (02/01 0157) Weight:  [79 kg (174 lb 2.6 oz)-80.2 kg (176 lb 12.9 oz)] 80.2 kg (176 lb 12.9 oz) (02/01 0439)  PHYSICAL EXAMINATION:   General Appearance:    Looks non toxic on NRB  Head:    Normocephalic, without obvious abnormality, atraumatic  Eyes:    PERRL - yes, conjunctiva/corneas - clear      Ears:    Normal external ear canals, both ears  Nose:   NG tube - no but has face mask o2  Throat:  ETT TUBE - no , OG tube - n  Neck:    Supple,  No enlargement/tenderness/nodules     Lungs:     Some mild crackles only. No distress. Mild tachypnea  Chest wall:    No deformity  Heart:    S1 and S2 normal, no murmur, CVP - no.  Pressors - no  Abdomen:     Soft, no masses, no organomegaly  Genitalia:    Not done  Rectal:   not done  Extremities:   Extremities- intact     Skin:   Intact in exposed areas . Sacral area - no decub     Neurologic:   Sedation - none -> RASS - +1 . Moves all 4s - yes3. CAM-ICU - neg . Orientation - x3+      PULMONARY Recent Labs  Lab 06/01/17 1546  PHART 7.360  PCO2ART 39.9  PO2ART 54.0*  HCO3 22.1  TCO2 23  O2SAT 82.0    CBC Recent Labs  Lab 06/01/17 0157  HGB 11.2*  HCT 34.6*  WBC 7.6  PLT 200    COAGULATION No results for input(s): INR in the last 168 hours.  CARDIAC  No results for input(s): TROPONINI in the last 168 hours. No results for input(s): PROBNP in the last 168 hours.   CHEMISTRY Recent Labs  Lab 06/01/17 0157 06/01/17 0502 06/02/17 4098  NA 140 137 138  K 3.9 3.9 3.3*  CL 107 105 104  CO2 21* 20* 24  GLUCOSE 251* 314* 192*  BUN 21* 21* 15  CREATININE 1.64* 1.60* 1.54*  CALCIUM 8.7* 8.3* 8.3*   Estimated Creatinine Clearance: 38.9 mL/min (A) (by C-G formula based on SCr of 1.54 mg/dL (H)).   LIVER No results for input(s): AST, ALT, ALKPHOS, BILITOT, PROT, ALBUMIN, INR in the last 168 hours.   INFECTIOUS Recent Labs  Lab 06/01/17 1853 06/02/17 0342  PROCALCITON 0.11 0.16     ENDOCRINE CBG (last 3)  Recent Labs    06/01/17 1716 06/01/17 2134 06/02/17 0727  GLUCAP 195* 267* 209*         IMAGING x48h  - image(s) personally visualized  -   highlighted in bold Dg Chest 2 View  Result Date: 06/01/2017 CLINICAL DATA:  Cough and shortness of breath tonight. EXAM: CHEST  2 VIEW COMPARISON:  08/13/2013 FINDINGS: Heart size and pulmonary vascularity are normal. Linear scarring in the left mid lung and right lower lung without  change since prior study. Interval development of right greater than left perihilar infiltration. This likely represents pneumonia. Edema less likely. No pleural effusions. No pneumothorax. Mediastinal contours appear intact. Degenerative changes in the spine. IMPRESSION: New perihilar infiltrates, greater on the right likely representing pneumonia. Electronically Signed   By: Lucienne Capers M.D.   On: 06/01/2017 02:15   Dg Chest Portable 1 View  Result Date: 06/01/2017 CLINICAL DATA:  Dyspnea with wheezing. EXAM: PORTABLE CHEST 1 VIEW COMPARISON:  Radiographs of June 01, 2016. FINDINGS: Stable cardiomediastinal silhouette. No pneumothorax is noted. Worsening bilateral perihilar and basilar airspace opacities are noted concerning for worsening pneumonia or possibly edema. No pneumothorax or pleural effusion is noted. Bony thorax is unremarkable. IMPRESSION: Significantly worsening bilateral perihilar and basilar opacities concerning for worsening pneumonia or edema. Electronically Signed   By: Marijo Conception, M.D.   On: 06/01/2017 15:47       ASSESSMENT / PLAN: Principal Problem:   Influenza A Active Problems:   HTN (hypertension)   CKD (chronic kidney disease)   Chronic combined systolic and diastolic CHF, NYHA class 2 (HCC)   DM (diabetes mellitus) type II uncontrolled, periph vascular disorder (HCC)   Influenza, pneumonia   Acute respiratory failure with hypoxia (HCC)   Acute respiratory failure with hypoxia (HCC) Most likely this is all Flu A viral PNA +/- recurrent acute systolic/diastolic chf Very little evidence for bacterial pna based on low PCT Worse overnight iwht hypoxemia  Plan Start bipap Contiue o2 for pulse ox > 88% Lasix x 1 zx 40mg  Get echo Check urine strep and legionella - if negative dc antibioptic Continue tamiflu If worse move to IC     The patient is critically ill with multiple organ systems failure and requires high complexity decision making for  assessment and support, frequent evaluation and titration of therapies, application of advanced monitoring technologies and extensive interpretation of multiple databases.   Critical Care Time devoted to patient care services described in this note is  30  Minutes. This time reflects time of care of this signee Dr Brand Males. This critical care time does not reflect procedure time, or teaching time or supervisory time of PA/NP/Med student/Med Resident etc but could involve care discussion time    Dr. Brand Males, M.D., [facility].C.P Pulmonary and Critical Care Medicine Staff Physician [place] [facility] and Critical [facility] Pager: [phone], If no answer or  between  15:00h - 7:00h: call [phone]  06/02/2017 9:24 AM

## 2017-06-02 NOTE — Progress Notes (Signed)
Patient placed in AVAPS mode for NIV HS  Vt 400 Min IP 8 Max IP 20 40%

## 2017-06-02 NOTE — Progress Notes (Signed)
Inpatient Diabetes Program Recommendations  AACE/ADA: New Consensus Statement on Inpatient Glycemic Control (2015)  Target Ranges:  Prepandial:   less than 140 mg/dL      Peak postprandial:   less than 180 mg/dL (1-2 hours)      Critically ill patients:  140 - 180 mg/dL   Lab Results  Component Value Date   GLUCAP 142 (H) 06/02/2017   HGBA1C 6.5 01/26/2017    Review of Glycemic ControlResults for LOUIS, GAW (MRN [id number]) as of 06/02/2017 15:24  Ref. Range 06/01/2017 07:57 06/01/2017 11:36 06/01/2017 17:16 06/01/2017 21:34 06/02/2017 07:27 06/02/2017 11:58  Glucose-Capillary Latest Ref Range: 65 - 99 mg/dL 246 (H) 164 (H) 195 (H) 267 (H) 209 (H) 142 (H)    Diabetes history: Type 2 DM Outpatient Diabetes medications: Levemir 20 units q HS, Janumet 50-1000 bid Current orders for Inpatient glycemic control:  Novolog moderate tid with meals, Levemir 15 units q HS  Inpatient Diabetes Program Recommendations:    Please consider increasing Levemir to 20 units q HS.   Thanks,  Adah Perl, RN, BC-ADM Inpatient Diabetes Coordinator Pager [phone] (8a-5p)

## 2017-06-02 NOTE — Progress Notes (Signed)
Urine specimen sent to lab

## 2017-06-02 NOTE — Progress Notes (Signed)
Subjective:  [name] was seen lying in bed comfortably this morning. She stated yesterday when she tried to get up to use the restroom, she felt that her breathing acutely worsened. She was placed on 100% re-breather to maintain oxygen saturation. Repeat CXR showed worsening multifocal PNA vs pulmonary edema. PCCM was consulted, and she was started on vanc/cefepime/azithromycin and given a 1 time dose of IV furosemide 40mg . This morning, she denies shortness of breath, chest pain, fevers, chills, or lower extremity swelling.  Objective:  Vital signs in last 24 hours: Vitals:   06/02/17 0737 06/02/17 1034 06/02/17 1036 06/02/17 1201  BP: (!) 158/75 (!) 155/76  (!) 87/76  Pulse: 85 84  81  Resp: (!) 22 14  (!) 21  Temp: 100 F (37.8 C)   (!) 97.2 F (36.2 C)  TempSrc: Axillary   Axillary  SpO2: 97% 95% 95% 100%  Weight:      Height:       GEN: Well-appearing, well-nourished. Alert and oriented. No acute distress. Venturi mask in place. RESP: Mild wheezes in lower lung fields. No increased work of breathing. CV: Normal rate and regular rhythm. No murmurs, gallops, or rubs. No JVD elevation. No LE edema. EXT: No edema. Warm and well perfused. NEURO: Cranial nerves II-XII grossly intact. Able to lift all four extremities against gravity. No apparent audiovisual hallucinations. Speech fluent and appropriate. PSYCH: Patient is calm and pleasant. Appropriate affect. Well-groomed; speech is appropriate and on-subject.  Assessment/Plan:  Principal Problem:   Influenza A Active Problems:   HTN (hypertension)   CKD (chronic kidney disease)   Chronic combined systolic and diastolic CHF, NYHA class 2 (HCC)   DM (diabetes mellitus) type II uncontrolled, periph vascular disorder (HCC)   Influenza, pneumonia   Acute respiratory failure with hypoxia ([place])  [name] is a 62yo female with PMH of T2DM, HTN, and HFpEF (grade 2 DD on TTE 10/2016) who presented with several days of flu-like  symptoms and increased oxygen requirement, found to be positive for Influenza A. Acutely worsened SOB yesterday while ambulating to restroom and repeat CXR with concern for multifocal pneumonia vs pulmonary edema. 1 dose of IV lasix and vanc/cefepime/azithromycin initiated. Procalcitonin low at 0.16, thus patient's presentation most consistent with viral PNA secondary to influenza.  Viral PNA, secondary to Influenza A CXR with multifocal pneumonia vs pulmonary edema. On Tamiflu. She reports feeling better this morning. Denies shortness of breath, chest pain, fevers, or lower extremity swelling. She is on HFNC with O2 sats >95%. WBC normal at 4.4. Blood cultures NGTD. On cefepime/azithromycin. Will discontinue vancomycin given renal toxicity and probable low benefit. PCCM consulted and checking for strep and legionella. Can discontinue other antibiotics if these return negative. - PCCM consulted; appreciate their assistance - Discontinue vancomycin - Continue azithromycin and cefepime - F/u urine strep and legionella, can d/c cefepime and azithromycin if negative - F/u BCx - Continue oseltamivir 30 mg BID (renally dosed), stop date 2/5 - Continuous pulse ox - Wean oxygen as tolerated - Ambulatory O2 sats once on room air - Duoneb 50mL q6h PRN - Loratadine 10mg  qday PRN - CBC tomorrow AM  Chronic combined systolic and diastolic HF with possible mild acute exacerbation ECHO in 10/2016 with intervalimprovement of EF to 57%, G2DD. BNP elevated to 436, but no prior to compare. Patient appears euvolemic on examination. Received 2 doses of IV lasix 40mg  per PCCM. - F/u repeat echo - Continue carvedilol 12.5mg  BID - BMP in AM  AKI  on CKD In the setting of poor PO intake for the past several days. Possibly secondary to dehydration vs cardiorenal. Baseline Cr 1.1-1.3. Cr stable at 1.6 -> 1.54, s/p gentle fluid resuscitation. BNP 436. Does not appear volume overloaded on exam, however PCCM consulted and  patient given IV lasix 40mg  x2. - BMP in AM - Avoid nephrotoxic agents, discontinuing vancomycin  HTN BP 159/76, HR 86. Home BP regimen includes amlodipine 10mg  daily, losartan 100mg  daily, and carvedilol 12.5mg  BID - Continue home amlodipine 10mg  daily - Continue home losartan 100mg  daily - Continue carvedilol 12.5mg  daily  Insulin-dependent T2DM A1c 6.5 in 12/2016. Home DM regimen includes Levemir 15 units and sitagliptin-metformin 50-1000mg  BID. CBGs 142-209. - Continue Levemir 15 units qhs - SSI-M - CBG monitoring  Dispo: Anticipated discharge in approximately 2-3 day(s).   Colbert Ewing, MD 06/02/2017, 1:05 PM Pager: Mamie Nick [phone]

## 2017-06-02 NOTE — Progress Notes (Signed)
  Echocardiogram 2D Echocardiogram has been performed.  Jannett Celestine 06/02/2017, 11:20 AM

## 2017-06-02 NOTE — Assessment & Plan Note (Signed)
Most likely this is all Flu A viral PNA +/- recurrent acute systolic/diastolic chf Very little evidence for bacterial pna based on low PCT Worse overnight iwht hypoxemia  Plan Start bipap Contiue o2 for pulse ox > 88% Lasix x 1 zx 40mg  Get echo Check urine strep and legionella - if negative dc antibioptic Continue tamiflu If worse move to IC

## 2017-06-02 NOTE — Progress Notes (Signed)
Medicine attending: I examined this patient today and reviewed all pertinent clinical, laboratory, and imaging data, and I concur with the evaluation and management plan which will be subsequently detailed by resident physician Dr. Colbert Ewing.  62 year old woman with grade 2 diastolic cardiac dysfunction documented on a July 2018 echocardiogram.  She has hypertension, hyperlipidemia, and type 2 diabetes on insulin.  She presented on the day of admission January 31 with a 2-day history of nonproductive cough, dyspnea, nausea, myalgia, generalized weakness and fatigue, fever, and chills. Initial respiratory rate 28.  Oxygen saturation 92%.  Initial temp 99.7 with subsequent elevation to 102.7.  Bilateral expiratory wheezes over the lungs.  Regular cardiac rhythm.  Vague perihilar infiltrates on the chest radiograph. White count 7600 with repeat this morning 4400.  Positive PCR for influenza A.  Cultures were obtained. BUN 21, creatinine 1.6 with baseline creatinine 1.2. BNP 436.  No baseline.  Pro-calcitonin 0.1 1 repeat 0.16. She was started on Tamiflu.  Over the course of the day yesterday, oxygen saturation fell when she got up to go to the bathroom.  A repeat chest x-ray now showing progressive interstitial infiltrates. She was seen in consultation by pulmonary/critical care.  Arterial blood gas with pH 7.36, PCO2 40, PO2 54 on oxygen.  A single dose of furosemide given and antibiotics with vancomycin and cefepime added.  On my exam this morning she was breathing comfortably on oxygen with saturation 97% on a 55% Ventimask. Lungs overall clear to auscultation and resonant to percussion throughout. Regular cardiac rhythm.  No JVD.  No S3 gallop.  No peripheral edema.  Chest radiograph today with persistent patchy infiltrates in the mid and lower lungs bilaterally stable to slightly improved compared with the prior study.  My overall impression is that this is viral pneumonia with a possible  minor element of cardiac decompensation although there are no physical examination signs of overt failure. Given rise in creatinine from her baseline, we need to be judicious in our use of diuretics.  Although empiric antibiotics for 72 hours until cultures are negative  is reasonable, I see no reason to continue the vancomycin.  Murriel Hopper, MD, [place]  Hematology-Oncology/Internal Medicine

## 2017-06-02 NOTE — Progress Notes (Signed)
Patient has not voided since order for urine tests were ordered.  Has pure-wick to suction.  Will collect as soon as patient voids.

## 2017-06-02 NOTE — Plan of Care (Signed)
  Progressing Elimination: Will not experience complications related to urinary retention 06/02/2017 0357 - Progressing by Colonel Bald, RN Note No s/s of urinary retention.

## 2017-06-03 DIAGNOSIS — R079 Chest pain, unspecified: Secondary | ICD-10-CM

## 2017-06-03 LAB — CBC
HCT: 35 % — ABNORMAL LOW (ref 36.0–46.0)
HEMOGLOBIN: 11.1 g/dL — AB (ref 12.0–15.0)
MCH: 27.9 pg (ref 26.0–34.0)
MCHC: 31.7 g/dL (ref 30.0–36.0)
MCV: 87.9 fL (ref 78.0–100.0)
PLATELETS: 169 10*3/uL (ref 150–400)
RBC: 3.98 MIL/uL (ref 3.87–5.11)
RDW: 16.1 % — ABNORMAL HIGH (ref 11.5–15.5)
WBC: 3.6 10*3/uL — ABNORMAL LOW (ref 4.0–10.5)

## 2017-06-03 LAB — GLUCOSE, CAPILLARY
GLUCOSE-CAPILLARY: 125 mg/dL — AB (ref 65–99)
GLUCOSE-CAPILLARY: 191 mg/dL — AB (ref 65–99)
Glucose-Capillary: 123 mg/dL — ABNORMAL HIGH (ref 65–99)
Glucose-Capillary: 154 mg/dL — ABNORMAL HIGH (ref 65–99)

## 2017-06-03 LAB — BASIC METABOLIC PANEL
Anion gap: 10 (ref 5–15)
BUN: 23 mg/dL — ABNORMAL HIGH (ref 6–20)
CALCIUM: 8.3 mg/dL — AB (ref 8.9–10.3)
CHLORIDE: 104 mmol/L (ref 101–111)
CO2: 25 mmol/L (ref 22–32)
CREATININE: 1.85 mg/dL — AB (ref 0.44–1.00)
GFR calc Af Amer: 33 mL/min — ABNORMAL LOW (ref >60)
GFR calc non Af Amer: 29 mL/min — ABNORMAL LOW (ref >60)
GLUCOSE: 139 mg/dL — AB (ref 65–99)
Potassium: 3.4 mmol/L — ABNORMAL LOW (ref 3.5–5.1)
Sodium: 139 mmol/L (ref 135–145)

## 2017-06-03 LAB — LEGIONELLA PNEUMOPHILA SEROGP 1 UR AG: L. PNEUMOPHILA SEROGP 1 UR AG: NEGATIVE

## 2017-06-03 LAB — PROCALCITONIN: Procalcitonin: 0.16 ng/mL

## 2017-06-03 MED ORDER — FUROSEMIDE 10 MG/ML IJ SOLN
40.0000 mg | Freq: Once | INTRAMUSCULAR | Status: AC
  Administered 2017-06-03: 40 mg via INTRAVENOUS
  Filled 2017-06-03: qty 4

## 2017-06-03 MED ORDER — IPRATROPIUM-ALBUTEROL 0.5-2.5 (3) MG/3ML IN SOLN
3.0000 mL | Freq: Two times a day (BID) | RESPIRATORY_TRACT | Status: DC
  Administered 2017-06-04 – 2017-06-05 (×3): 3 mL via RESPIRATORY_TRACT
  Filled 2017-06-03 (×3): qty 3

## 2017-06-03 MED ORDER — POTASSIUM CHLORIDE CRYS ER 20 MEQ PO TBCR
40.0000 meq | EXTENDED_RELEASE_TABLET | Freq: Two times a day (BID) | ORAL | Status: AC
  Administered 2017-06-03 (×2): 40 meq via ORAL
  Filled 2017-06-03 (×2): qty 2

## 2017-06-03 NOTE — Progress Notes (Signed)
Name: [name] MRN: [id number] DOB: 1955/06/24    ADMISSION DATE:  06/01/2017 CONSULTATION DATE:  06/01/2017  REFERRING MD :  Fredric Dine  CHIEF COMPLAINT:  SOB, AMS and Hypoxemia  BRIEF PATIENT DESCRIPTION: 62 year old female  Never smoker with PMH of CHF, DM and HTN who presents to [facility] ED with DOE.  In the ED she was hypoxemic and was started on 6L [place] and was able to have sat of 95%.  Patient was admitted by IMTS but had an acute change in mental status and became hypoxemic requiring 100% NRB after a bathroom trip.  PCCM was consulted.    A single dose of lasix was given and vanc/cefepime were added.  Tamiflu 30 BID given.        SUBJECTIVE/OVERNIGHT/INTERVAL HX Much better overnight, did not tol bipap well and did fine on 6lpm per RT notes   VITAL SIGNS: Temp:  [99.1 F (37.3 C)-101.2 F (38.4 C)] 99.6 F (37.6 C) (02/02 1123) Pulse Rate:  [79-88] 84 (02/02 0423) Resp:  [12-24] 19 (02/02 0423) BP: (106-141)/(59-79) 106/59 (02/02 1123) SpO2:  [91 %-97 %] 96 % (02/02 1410) FiO2 (%):  [40 %-60 %] 40 % (02/02 0152) Weight:  [167 lb 12.8 oz (76.1 kg)] 167 lb 12.8 oz (76.1 kg) (02/02 0423)   Intake/Output Summary (Last 24 hours) at 06/03/2017 1547 Last data filed at 06/03/2017 1200 Gross per 24 hour  Intake 415 ml  Output 690 ml  Net -275 ml      PHYSICAL EXAMINATION:  Pleasant bf < 30 degrees hob/ nad on 4lpm    Pt alert, approp  No jvd Oropharynx clear Neck supple Lungs with a few scattered exp > insp rhonchi bilaterally RRR no s3 or or sign murmur Abd obese / soft, nl excursion on insp  Extr wam with no edema or clubbing noted      PULMONARY Recent Labs  Lab 06/01/17 1546  PHART 7.360  PCO2ART 39.9  PO2ART 54.0*  HCO3 22.1  TCO2 23  O2SAT 82.0    CBC Recent Labs  Lab 06/01/17 0157 06/02/17 1146 06/03/17 0311  HGB 11.2* 10.8* 11.1*  HCT 34.6* 33.4* 35.0*  WBC 7.6 4.4 3.6*  PLT 200 171 169    COAGULATION No results for input(s):  INR in the last 168 hours.  CARDIAC  No results for input(s): TROPONINI in the last 168 hours. No results for input(s): PROBNP in the last 168 hours.   CHEMISTRY Recent Labs  Lab 06/01/17 0157 06/01/17 0502 06/02/17 0342 06/03/17 0311  NA 140 137 138 139  K 3.9 3.9 3.3* 3.4*  CL 107 105 104 104  CO2 21* 20* 24 25  GLUCOSE 251* 314* 192* 139*  BUN 21* 21* 15 23*  CREATININE 1.64* 1.60* 1.54* 1.85*  CALCIUM 8.7* 8.3* 8.3* 8.3*   Estimated Creatinine Clearance: 31.6 mL/min (A) (by C-G formula based on SCr of 1.85 mg/dL (H)).   LIVER No results for input(s): AST, ALT, ALKPHOS, BILITOT, PROT, ALBUMIN, INR in the last 168 hours.   INFECTIOUS Recent Labs  Lab 06/01/17 1853 06/02/17 0342 06/03/17 0311  PROCALCITON 0.11 0.16 0.16     ENDOCRINE CBG (last 3)  Recent Labs    06/02/17 2117 06/03/17 0752 06/03/17 1121  GLUCAP 133* 125* 123*         IMAGING x48h  - image(s) personally visualized  -   highlighted in bold Dg Chest Port 1 View  Result Date: 06/02/2017 CLINICAL DATA:  Acute respiratory failure EXAM: PORTABLE CHEST 1 VIEW COMPARISON:  Chest radiograph from one day prior. FINDINGS: Stable cardiomediastinal silhouette with top-normal heart size. No pneumothorax. No significant pleural effusions. Patchy consolidation in the mid to lower lungs bilaterally with air bronchograms at the lung bases, slightly improved in both lungs. IMPRESSION: Slight improvement in patchy consolidation in the mid to lower lungs bilaterally with air bronchograms at the lung bases, suggesting a combination of multilobar pneumonia and improving atelectasis. Electronically Signed   By: Ilona Sorrel M.D.   On: 06/02/2017 09:33       ASSESSMENT / PLAN: Principal Problem:   Influenza A   HTN (hypertension)   CKD (chronic kidney disease)   Chronic combined systolic and diastolic CHF, NYHA class 2 (HCC)   DM (diabetes mellitus) type II uncontrolled, periph vascular disorder (HCC)    Influenza, pneumonia   Acute respiratory failure with hypoxia (HCC)   Acute respiratory failure with hypoxia (HCC) Agree most likely  Flu A viral PNA +/- recurrent acute systolic/diastolic chf Very little evidence for bacterial pna based on low PCT and note urine strep/ legionella neg as well so ok off abx Improved with diuresis   Plan D/c  bipap Titrate 02  for pulse ox > 88% Continue diurersis/ cardiac f/u primary svc      PCCM f/u prn     Christinia Gully, MD Pulmonary and [place] Cell [phone] After 5:30 PM or weekends, use Bee[facility] [phone]

## 2017-06-03 NOTE — Plan of Care (Signed)
  Progressing Education: Knowledge of General Education information will improve 06/03/2017 0407 - Progressing by Colonel Bald, RN Note Oriented to room, unit, and equipment. Pain Managment: General experience of comfort will improve 06/03/2017 0407 - Progressing by Colonel Bald, RN Note Denies c/o pain or discomfort.

## 2017-06-03 NOTE — Progress Notes (Signed)
   Subjective: Doing well today, sitting in chair out of bed. Reports improvement in SOB, breathing comfortably on [place].   Objective:  Vital signs in last 24 hours: Vitals:   06/03/17 0230 06/03/17 0423 06/03/17 0738 06/03/17 0754  BP:  (!) 141/79  120/79  Pulse: 88 84    Resp: (!) 23 19    Temp:  (!) 101.2 F (38.4 C)  99.9 F (37.7 C)  TempSrc:  Oral  Oral  SpO2: 94% 95% 91% 96%  Weight:  167 lb 12.8 oz (76.1 kg)    Height:        GEN: Well-appearing, well-nourished. Alert and oriented. No acute distress.  RESP: Very mild bibasilar crackles. No increased work of breathing. CV: Normal rate and regular rhythm. No murmurs, gallops, or rubs. No JVD elevation. No LE edema. EXT: No edema. Warm and well perfused. NEURO: Cranial nerves II-XII grossly intact. Able to lift all four extremities against gravity.  PSYCH: Patient is calm and pleasant. Appropriate affect. Well-groomed; speech is appropriate and on-subject.  Assessment/Plan: Ms. Mcdonnell is a 62yo female with PMH of T2DM, HTN, and HFpEF (grade 2 DD on TTE 10/2016) who presented with several days of flu-like symptoms and increased oxygen requirement, found to be positive for Influenza A. Acutely worsened SOB overnight after admission like due to acute pulmonary edema. IVFs were stopped and she was diuresed with IV lasix. Echocardiogram yesterday with newly reduced EF, 25-30%. Antibiotics discontinued.   Viral PNA, secondary to Influenza A On Tamiflu. Clinically appears improved, currently sating well on  and sitting out of bed. Reports improvement in symptoms Unfortunately persistently febrile overnight with new mild leukopenia and AKI on morning labs. Will continue with supportive care, antibiotics discontinued. PCCM following, appreciate recommendations. Monitor closely in step down.  - Continue oseltamivir 30 mg BID (renally dosed), stop date 2/5 - Procalcitonin low, urine strep pneumo antigen negative, antibiotics discontinued    - F/u urine legionella Ag - F/u BCx 1/31 >> NG x 2 days  - Continuous pulse ox - Wean oxygen as tolerated - Duoneb 66mL q6h PRN - Loratadine 10mg  qday PRN - CBC daily for now   Chronic combined systolic and diastolic HF: With significantly reduced EF 25-30% seen on echocardiogram yesterday from 57% in 10/2016. S/p 2 doses of IV lasix thus far. Volume status difficult to assess, does have some mild bibasilar crackles. Will continue with IV lasix today, monitor renal function closely. Will need cardiology follow up once recovered from acute illness.  - IV Lasix 40 mg  - Continue carvedilol 12.5mg  BID - BMP in AM  AKI on CKD In the setting of influenza PNA and CHF exacerbation. Attempting diuresis, will monitor renal function closely.  - BMP in AM - Avoid nephrotoxic agents  HTN  Home BP regimen includes amlodipine 10mg  daily, losartan 100mg  daily, and carvedilol 12.5mg  BID. BP soft today, will continue holding losartan.  - Continue home amlodipine 10mg  daily - Continue carvedilol 12.5mg  daily - Hold home losartan 100mg  daily  Insulin-dependent T2DM A1c 6.5 in 12/2016. Home DM regimen includes Levemir 15 units and sitagliptin-metformin 50-1000mg  BID.CBGs 142-209. - Continue Levemir 15 units qhs - SSI-M - CBG monitoring  Dispo: Anticipated discharge in approximately 2-3 day(s).   Velna Ochs, MD 06/03/2017, 10:37 AM Pager: [phone]

## 2017-06-04 LAB — GLUCOSE, CAPILLARY
Glucose-Capillary: 152 mg/dL — ABNORMAL HIGH (ref 65–99)
Glucose-Capillary: 149 mg/dL — ABNORMAL HIGH (ref 65–99)
Glucose-Capillary: 165 mg/dL — ABNORMAL HIGH (ref 65–99)
Glucose-Capillary: 273 mg/dL — ABNORMAL HIGH (ref 65–99)

## 2017-06-04 LAB — BASIC METABOLIC PANEL
ANION GAP: 11 (ref 5–15)
BUN: 25 mg/dL — AB (ref 6–20)
CHLORIDE: 105 mmol/L (ref 101–111)
CO2: 24 mmol/L (ref 22–32)
Calcium: 8.3 mg/dL — ABNORMAL LOW (ref 8.9–10.3)
Creatinine, Ser: 1.45 mg/dL — ABNORMAL HIGH (ref 0.44–1.00)
GFR calc Af Amer: 44 mL/min — ABNORMAL LOW (ref >60)
GFR, EST NON AFRICAN AMERICAN: 38 mL/min — AB (ref >60)
Glucose, Bld: 161 mg/dL — ABNORMAL HIGH (ref 65–99)
POTASSIUM: 3.9 mmol/L (ref 3.5–5.1)
SODIUM: 140 mmol/L (ref 135–145)

## 2017-06-04 LAB — CBC
HCT: 32.5 % — ABNORMAL LOW (ref 36.0–46.0)
HEMOGLOBIN: 10.4 g/dL — AB (ref 12.0–15.0)
MCH: 28.1 pg (ref 26.0–34.0)
MCHC: 32 g/dL (ref 30.0–36.0)
MCV: 87.8 fL (ref 78.0–100.0)
PLATELETS: 166 10*3/uL (ref 150–400)
RBC: 3.7 MIL/uL — AB (ref 3.87–5.11)
RDW: 16.1 % — ABNORMAL HIGH (ref 11.5–15.5)
WBC: 3.9 10*3/uL — ABNORMAL LOW (ref 4.0–10.5)

## 2017-06-04 MED ORDER — GUAIFENESIN-DM 100-10 MG/5ML PO SYRP
5.0000 mL | ORAL_SOLUTION | ORAL | Status: DC | PRN
  Administered 2017-06-04 (×2): 5 mL via ORAL
  Filled 2017-06-04 (×2): qty 5

## 2017-06-04 MED ORDER — INSULIN DETEMIR 100 UNIT/ML ~~LOC~~ SOLN
17.0000 [IU] | Freq: Every day | SUBCUTANEOUS | Status: DC
  Administered 2017-06-04: 17 [IU] via SUBCUTANEOUS
  Filled 2017-06-04 (×2): qty 0.17

## 2017-06-04 MED ORDER — FUROSEMIDE 10 MG/ML IJ SOLN
40.0000 mg | Freq: Once | INTRAMUSCULAR | Status: AC
  Administered 2017-06-04: 40 mg via INTRAVENOUS
  Filled 2017-06-04: qty 4

## 2017-06-04 NOTE — Progress Notes (Signed)
   Subjective:  Ms. Dirocco was seen lying in bed comfortably this morning. She states that she feels well - denies SOB, chest pain, lower extremity swelling, or fevers/chills.  Objective:  Vital signs in last 24 hours: Vitals:   06/03/17 2017 06/04/17 0024 06/04/17 0615 06/04/17 0914  BP: 130/67 137/69 139/74   Pulse: 79 83 76   Resp: (!) 22 19 18    Temp: 98.6 F (37 C) 98.4 F (36.9 C) 98.2 F (36.8 C)   TempSrc: Oral Oral Oral   SpO2: 97% 96% 91% 94%  Weight:   169 lb 4.8 oz (76.8 kg)   Height:       GEN: Well-appearing, well-nourished. Alert and oriented. No acute distress. RESP: Very mild bibasilar crackles. No increased work of breathing. CV: Normal rate and regular rhythm. No murmurs, gallops, or rubs. No JVD elevation. No LE edema. EXT: No edema. Warm and well perfused. NEURO: Cranial nerves II-XII grossly intact. Able to lift all four extremities against gravity. PSYCH: Patient is calm and pleasant. Appropriate affect. Well-groomed; speech is appropriate and on-subject.  Assessment/Plan: Ms. Brubacher is a 62yo female with PMH of T2DM, HTN, and HFpEF (grade 2 DD on TTE 10/2016) who presented with several days of flu-like symptoms and increased oxygen requirement, found to be positive for Influenza A. Acutely worsened SOB overnight after admission likely due to acute pulmonary edema. IVFs were stopped and she was diuresed with IV lasix. Echocardiogram yesterday with newly reduced EF, 25-30%. Antibiotics discontinued.  Viral PNA, secondary to Influenza A On Tamiflu. Clinically appears improved and reports improvement in symptoms. Afebrile since 2/2 afternoon. Mild leukopenia and continued AKI today. Urine strep pneumo antigen and urine legionella antigen negative with low procalcitonin, thus antibiotics have been discontinued. Will continue with supportive care. - PCCM following, appreciate recommendations. - Transfer to telemetry - Continue oseltamivir 30 mg BID (renally  dosed), stop date 2/5 - F/u BCx 1/31 >> NG x 2 days - Continuous pulse ox - Wean oxygen as tolerated - Duoneb 57mL q6h PRN - Loratadine 10mg  qday PRN - CBC in AM  Chronic combined systolic and diastolic HF With significantly reduced EF 25-30% seen on echocardiogram yesterday from 57% in 10/2016. S/p 3 doses of IV lasix thus far. Cr improved from 1.85 -> 1.45 today. Clinically does not appear overloaded, but will continue with another dose of IV diuresis and monitor renal function closely. Will need cardiology follow up once recovered from acute illness.  - IV Lasix 40 mg x1 - Continue carvedilol 12.5mg  BID - BMP in AM - Will consult Cardiology tomorrow AM for evaluation of new LV dysfunction  AKI on CKD In the setting of influenza PNA and CHF exacerbation. Cr improved from 1.85 > 1.45 after diuresis with IV lasix x3. Will continue with another dose and monitor renal function closely. - BMP in AM - Avoid nephrotoxic agents  HTN Home BP regimen includes amlodipine 10mg  daily, losartan 100mg  daily, and carvedilol 12.5mg  BID. BP 112/67 today, will continue holding losartan.  - Continue home amlodipine 10mg  daily - Continue carvedilol 12.5mg  daily - Hold home losartan 100mg  daily  Insulin-dependent T2DM A1c 6.5 in 12/2016. Home DM regimen includes Levemir 15 units and sitagliptin-metformin 50-1000mg  BID.CBGs 150-190 - Increase to Levemir 17u QHS - SSI-M - CBG monitoring  Dispo: Anticipated discharge in approximately 1-2 day(s).  Colbert Ewing, MD 06/04/2017, 11:35 AM Pager: [phone]

## 2017-06-05 ENCOUNTER — Inpatient Hospital Stay (HOSPITAL_COMMUNITY): Payer: Self-pay

## 2017-06-05 DIAGNOSIS — N183 Chronic kidney disease, stage 3 (moderate): Secondary | ICD-10-CM

## 2017-06-05 DIAGNOSIS — I5043 Acute on chronic combined systolic (congestive) and diastolic (congestive) heart failure: Secondary | ICD-10-CM

## 2017-06-05 LAB — CBC
HEMATOCRIT: 32.8 % — AB (ref 36.0–46.0)
Hemoglobin: 10.5 g/dL — ABNORMAL LOW (ref 12.0–15.0)
MCH: 28.1 pg (ref 26.0–34.0)
MCHC: 32 g/dL (ref 30.0–36.0)
MCV: 87.7 fL (ref 78.0–100.0)
PLATELETS: 181 10*3/uL (ref 150–400)
RBC: 3.74 MIL/uL — AB (ref 3.87–5.11)
RDW: 15.6 % — ABNORMAL HIGH (ref 11.5–15.5)
WBC: 4.2 10*3/uL (ref 4.0–10.5)

## 2017-06-05 LAB — BASIC METABOLIC PANEL
Anion gap: 11 (ref 5–15)
BUN: 22 mg/dL — ABNORMAL HIGH (ref 6–20)
CHLORIDE: 105 mmol/L (ref 101–111)
CO2: 24 mmol/L (ref 22–32)
CREATININE: 1.22 mg/dL — AB (ref 0.44–1.00)
Calcium: 8.5 mg/dL — ABNORMAL LOW (ref 8.9–10.3)
GFR calc non Af Amer: 47 mL/min — ABNORMAL LOW (ref >60)
GFR, EST AFRICAN AMERICAN: 55 mL/min — AB (ref >60)
Glucose, Bld: 173 mg/dL — ABNORMAL HIGH (ref 65–99)
Potassium: 3.7 mmol/L (ref 3.5–5.1)
Sodium: 140 mmol/L (ref 135–145)

## 2017-06-05 LAB — GLUCOSE, CAPILLARY
Glucose-Capillary: 161 mg/dL — ABNORMAL HIGH (ref 65–99)
Glucose-Capillary: 259 mg/dL — ABNORMAL HIGH (ref 65–99)

## 2017-06-05 MED ORDER — LOSARTAN POTASSIUM 50 MG PO TABS: 100.0000 mg | ORAL_TABLET | Freq: Every day | ORAL | Status: DC

## 2017-06-05 MED ORDER — LOSARTAN POTASSIUM 50 MG PO TABS: 50.0000 mg | ORAL_TABLET | Freq: Every day | ORAL | Status: DC

## 2017-06-05 MED ORDER — GUAIFENESIN-DM 100-10 MG/5ML PO SYRP: 5.0000 mL | ORAL_SOLUTION | ORAL | 0 refills | Status: DC | PRN

## 2017-06-05 MED ORDER — LOSARTAN POTASSIUM 50 MG PO TABS: 50.0000 mg | ORAL_TABLET | Freq: Every day | ORAL | 0 refills | Status: DC

## 2017-06-05 MED FILL — LOSARTAN POTASSIUM 50 MG TA: 50 | 30 days supply | Qty: 30 | Fill #0

## 2017-06-05 NOTE — Progress Notes (Signed)
Subjective:  Ms. Lazo was seen lying in bed this morning. She reports significant improvement in her symptoms. Continues to endorse some wheezing and cough, but SOB is much improved. She denies chest pain and fevers/chills.  Objective:  Vital signs in last 24 hours: Vitals:   06/04/17 2020 06/04/17 2113 06/05/17 0015 06/05/17 0500  BP: 135/68  137/73 (!) 116/54  Pulse: 73  72 69  Resp: 20  19 16   Temp: 99 F (37.2 C)  98.9 F (37.2 C) 98.9 F (37.2 C)  TempSrc: Oral  Axillary Axillary  SpO2: 96% 97% 97% 97%  Weight:    167 lb 12.8 oz (76.1 kg)  Height:       GEN: Well-appearing, well-nourished. Alert and oriented. No acute distress. RESP: Expiratory wheezes and coarse breath sounds. No increased work of breathing. CV: Normal rate and regular rhythm. No murmurs, gallops, or rubs. No LE edema. EXT: No edema. Warm and well perfused. NEURO: Cranial nerves II-XII grossly intact. Able to lift all four extremities against gravity. PSYCH: Patient is calm and pleasant. Appropriate affect. Well-groomed; speech is appropriate and on-subject.  Assessment/Plan: Ms. Selph is a 62yo female with PMH of T2DM, HTN, and HFpEF (grade 2 DD on TTE 10/2016) who presented with several days of flu-like symptoms and increased oxygen requirement, found to be positive for Influenza A. Acutely worsened SOB overnight after admission likely due to acute pulmonary edema. IVFs were stopped and she was diuresed with IV lasix. Echocardiogram yesterday with newly reduced EF, 25-30%. Antibiotics discontinued.  Viral PNA, secondary to Influenza A On Tamiflu (last dose today). Clinically appears improved and reports improvement in symptoms. Afebrile since 2/2 afternoon. Antibiotics d/c'ed due to low suspicion for bacterial infection and clinical improvement. Will continue with supportive care. - PCCM following, appreciate recommendations. - Telemetry - Continue oseltamivir 30 mg BID (renally dosed) - last dose  today - F/u BCx 1/31 >> NG x 3 days - Continuous pulse ox - Wean supplemental oxygen as tolerated - Ambulatory sats - Duoneb 23mL q6h PRN - Loratadine 10mg  qday PRN - Will get a repeat CXR - Potentially d/c today pending CXR results  Cardiomyopathy Intermittent chest pain Follows with Dr. Tamala Julian. Nuclear stress test in 10/2016 showed mild reduction in blood flow on lateral inferior wall. At the time, she also had normal EF (57%), so she was medically managed. Noted by Dr. 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 Recommended risk factor modification and daily aspirin at that time. On echo during this admission, she now has reduced EF 25-30%, possibly acute 2/2 flu? She tells [place] that she has had episodes of intermittent chest pain for a few months prior to admission. She endorses some continued SOB today, however it is much improved from admission. No chest pain currently. - Cardiology consulted; appreciate their recommendations - Continue home carvedilol 12.5mg  BID and home amlodipine 10mg  daily - Cardiology recommending decreased dose of losartan to 50mg  daily due to low-normal BP - Continue home aspirin 81mg  daily and atorvastatin 40mg  daily  Chronic combined systolic and diastolic HF With significantly reduced EF 25-30% seen on echocardiogram yesterday from 57% in 10/2016. S/p 3 doses of IV lasix thus far. Cr improved from 1.45 -> 1.22 (now at baseline). Clinically does not appear overloaded, looks better today, and no longer requiring supplemental oxygen. Will thus hold IV diuresis. May require PRN lasix at home. Will need cardiology follow up on discharge. - Cardiology consulted; appreciate their recommendations - Continue carvedilol 12.5mg   BID - Losartan 50mg  daily as above - Potentially d/c today pending CXR results  AKI on CKD, resolved In the setting of influenza PNA and CHF exacerbation. Cr improved from 1.85 > 1.45 -> 1.22 after diuresis with IV lasix x3.  Baseline Cr ~1.2-1.3 - Restarting losartan as above  HTN Home BP regimen includes amlodipine 10mg  daily, losartan 100mg  daily, and carvedilol 12.5mg  BID. BP 116/54 today, will continue holding losartan.  - Continue home amlodipine 10mg  daily - Continue carvedilol 12.5mg  daily - Restarting losartan as above  Insulin-dependent T2DM A1c 6.5 in 12/2016. Home DM regimen includes Levemir 15 units and sitagliptin-metformin 50-1000mg  BID.CBGs 150-190 - Continue Levemir 17u QHS - SSI-M - CBG monitoring  Dispo: Anticipated discharge in approximately 0-1 day(s).  Colbert Ewing, MD 06/05/2017, 6:46 AM Pager: [phone]

## 2017-06-05 NOTE — Discharge Summary (Signed)
Name: [name] MRN: [id number] DOB: 06/07/1955 63 y.o. PCP: Melanee Spry, MD  Date of Admission: 06/01/2017  2:57 AM Date of Discharge: 06/05/2017 Attending Physician: Annia Belt, MD  Discharge Diagnosis: 1. Influenza A 2. HFrEF 3. AKI on CKD 4. HTN  Principal Problem:   Influenza A Active Problems:   HTN (hypertension)   CKD (chronic kidney disease)   Chronic combined systolic and diastolic CHF, NYHA class 2 (HCC)   DM (diabetes mellitus) type II uncontrolled, periph vascular disorder (HCC)   Influenza, pneumonia   Acute respiratory failure with hypoxia [facility])   Discharge Medications: Allergies as of 06/05/2017      Reactions   Plavix [clopidogrel Bisulfate] Rash      Medication List    TAKE these medications   amLODipine 10 MG tablet Commonly known as:  NORVASC Take 1 tablet (10 mg total) by mouth daily.   aspirin EC 81 MG tablet Take 1 tablet (81 mg total) by mouth daily. IM program, [facility]   atorvastatin 40 MG tablet Commonly known as:  LIPITOR Take 1 tablet (40 mg total) by mouth daily. IM program   BAYER MICROLET LANCETS lancets Check blood sugar three times a day. ICD 10: E11.51   blood glucose meter kit and supplies Check blood sugar two times a day   carvedilol 12.5 MG tablet Commonly known as:  COREG Take 1 tablet (12.5 mg total) by mouth 2 (two) times daily with a meal. IM program, [facility]   CONTOUR NEXT EZ MONITOR w/Device Kit Check blood sugar three times a day. ICD 10: E11.51   diclofenac sodium 1 % Gel Commonly known as:  VOLTAREN Apply 2 g topically 4 (four) times daily. What changed:    when to take this  reasons to take this   glucose blood test strip Commonly known as:  BAYER CONTOUR NEXT TEST Check blood sugar three times a day. ICD 10: E11.51   guaiFENesin-dextromethorphan 100-10 MG/5ML syrup Commonly known as:  ROBITUSSIN DM Take 5 mLs by mouth every 4 (four) hours as needed for cough.   insulin  detemir 100 UNIT/ML injection Commonly known as:  LEVEMIR Inject 0.2 mLs (20 Units total) into the skin at bedtime. IM program, [facility]   Insulin Syringe-Needle U-100 31G X 15/64" 0.3 ML Misc Use to take insulin one time a day   loratadine 10 MG tablet Commonly known as:  CLARITIN Take 1 tablet (10 mg total) by mouth daily. What changed:    when to take this  reasons to take this   losartan 50 MG tablet Commonly known as:  COZAAR Take 1 tablet (50 mg total) by mouth daily. Start taking on:  06/06/2017 What changed:    medication strength  how much to take   sitaGLIPtin-metformin 50-1000 MG tablet Commonly known as:  JANUMET Take 1 tablet by mouth 2 (two) times daily with a meal. IM program What changed:    how much to take  additional instructions   triamcinolone cream 0.1 % Commonly known as:  KENALOG Apply 1 application topically daily as needed for irritation.       Disposition and follow-up:   [name] was discharged from [facility] in Good condition.  At the hospital follow up visit please address:  1.  Clinically difficult to assess volume status. Not on lasix prior to admission, but found to have new reduced EF of 25-30%. Please consider need for PRN lasix at home  and whether she would be able to self-titrate her lasix at home.   2.  Labs / imaging needed at time of follow-up: None  3.  Pending labs/ test needing follow-up: BCx  Follow-up Appointments: Follow-up Information    Isaiah Serge, NP Follow up on 06/29/2017.   Specialties:  Cardiology, Radiology Why:  8:30 AM hospital follow-up with Dr. Thompson Caul NP Contact information: [address] [place] [address] [phone]        Melanee Spry, MD. Go on 06/09/2017.   Specialty:  Internal Medicine Why:  at 1:15pm Contact information: [place] [id number] [phone]        [name], MD Follow up.   Specialty:   Cardiology Contact information: [address] [address] [phone]           Hospital Course by problem list: Principal Problem:   Influenza A Active Problems:   HTN (hypertension)   CKD (chronic kidney disease)   Chronic combined systolic and diastolic CHF, NYHA class 2 (HCC)   DM (diabetes mellitus) type II uncontrolled, periph vascular disorder (HCC)   Influenza, pneumonia   Acute respiratory failure with hypoxia ([place])   1. Viral PNA, secondary to Influenza A Patient admitted with several days of flu-like symptoms and increased oxygen requirement, found to be positive for Influenza A. Treated with tamiflu. Acutely worsened SOB overnight after admission likely due to acute pulmonary edema. CXR showed worsened pulmonary vascular congestion. She was started on antibiotics for possible superimposed PNA. IVFs were stopped and she was diuresed with IV lasix. PCCM was consulted. Procalcitonin low, strep pneumo Ag and legionella Ag negative, so antibiotics discontinued. She continued to improve with tamiflu and supportive therapy. Follow up CXR prior to discharge showed improved airspace disease.  2. HFrEF Found to have significantly reduced EF 25-30% on echo during this hospitalization, decreased from "normal" on echo in 10/2016. She received diuresis with IV lasix with improvement in her symptoms. She was evaluated by Cardiology who suggested medical management with lower dose of losartan (33m daily) and continuing metoprolol and amlodipine. Stated she would ideally be on Entresto, but was unlikely to get this medication due to cost and lack of insurance. They suggested possible PRN lasix at discharge and follow up with Cardiology outpatient. She will need follow up echocardiogram to evaluate for recovery of cardiac function after acute illness.   3. AKI on CKD Baseline Cr 1.2-1.3. Admitted with mild AKI which resolved with IV diuresis. Losartan resumed at  538mdaily (previous dose was 100).  4. HTN Patient was normotensive throughout her hospitalization. Home amlodipine and carvedilol were continued. Home losartan was held due to AKI and was resumed at 5076maily on discharge, per Cardiology recommendation.  Discharge Vitals:   BP 118/62   Pulse 69   Temp 98.1 F (36.7 C) (Oral)   Resp 18   Ht '5\' 3"'  (1.6 m)   Wt 167 lb 12.8 oz (76.1 kg)   SpO2 97%   BMI 29.72 kg/m   Pertinent Labs, Studies, and Procedures:  CBC Latest Ref Rng & Units 06/05/2017 06/04/2017 06/03/2017  WBC 4.0 - 10.5 K/uL 4.2 3.9(L) 3.6(L)  Hemoglobin 12.0 - 15.0 g/dL 10.5(L) 10.4(L) 11.1(L)  Hematocrit 36.0 - 46.0 % 32.8(L) 32.5(L) 35.0(L)  Platelets 150 - 400 K/uL 181 166 169   CMP Latest Ref Rng & Units 06/05/2017 06/04/2017 06/03/2017  Glucose 65 - 99 mg/dL 173(H) 161(H) 139(H)  BUN 6 - 20 mg/dL 22(H) 25(H) 23(H)  Creatinine 0.44 - 1.00 mg/dL 1.22(H) 1.45(H) 1.85(H)  Sodium 135 - 145 mmol/L 140 140 139  Potassium 3.5 - 5.1 mmol/L 3.7 3.9 3.4(L)  Chloride 101 - 111 mmol/L 105 105 104  CO2 22 - 32 mmol/L '24 24 25  ' Calcium 8.9 - 10.3 mg/dL 8.5(L) 8.3(L) 8.3(L)  Total Protein 6.0 - 8.3 g/dL - - -  Total Bilirubin 0.3 - 1.2 mg/dL - - -  Alkaline Phos 39 - 117 U/L - - -  AST 0 - 37 U/L - - -  ALT 0 - 35 U/L - - -   BCx 1/31 -> NGTD x4d Urine legionella Ag negative PCT 0.16 Urine strep pneumo Ag negative  CXR 06/01/2017 New perihilar infiltrates, greater on the right likely representing pneumonia.  CXR 06/02/2017 Slight improvement in patchy consolidation in the mid to lower lungs bilaterally with air bronchograms at the lung bases, suggesting a combination of multilobar pneumonia and improving atelectasis.  CXR 06/05/2017 Near complete clearing of bibasilar airspace disease. Negative for heart failure.  TTE 06/02/2017 - LVEF 25-30%, severe LVH, global hypokinesis, grade 1 DD, elevated   LV filling pressure, mildly dilated RV with reduced systolic   function,  trivial pericardial effusion.  Discharge Instructions: Discharge Instructions    Call MD for:  difficulty breathing, headache or visual disturbances   Complete by:  As directed    Call MD for:  persistant dizziness or light-headedness   Complete by:  As directed    Call MD for:  persistant nausea and vomiting   Complete by:  As directed    Call MD for:  severe uncontrolled pain   Complete by:  As directed    Call MD for:  temperature >100.4   Complete by:  As directed    Diet - low sodium heart healthy   Complete by:  As directed    Increase activity slowly   Complete by:  As directed      Signed: Colbert Ewing, MD 06/05/2017, 3:24 PM   Pager: Mamie Nick [phone]

## 2017-06-05 NOTE — Discharge Instructions (Addendum)
Ms. Douglas, Rooks were admitted with the flu. Your symptoms have improved after treatment in the hospital. Your heart function was a little bit worse compared to what it was in July, which may have happened because of the flu. We had the heart doctors come see you and they recommended that we continue to treat you with medicine and that you follow up with them outpatient. - Please change your losartan to 50mg  daily  Please go to the Internal Medicine clinic at 1:15pm on 06/09/2017. If you cannot make it to this appointment, please call the clinic to re-schedule. Please follow up with the heart doctors on 06/29/2017.

## 2017-06-05 NOTE — Consult Note (Signed)
Cardiology Consultation:   Patient ID: [name]; [id number]; 1956/02/24   Admit date: 06/01/2017 Date of Consult: 06/05/2017  Primary Care Provider: Melanee Spry, MD Primary Cardiologist: Sinclair Grooms, MD   Patient Profile:   [name] is a 62 y.o. female with a hx of HTN, DM, PVD, chronic diastolic HF, prior h/o low EF in 2015 30-35% (EF improved back to normal in 2018) and h/o abnormal NST but no h/o cardiac cath, admitted by IM for influenza A and acute CHF. 2D echo shows reduced LVEF at 25-30%. Pt also with recent h/o chest pain. Pt is being seen today for the evaluation of chest pain and reduced EF, at the request of Dr. Beryle Beams, Internal Medicine.  History of Present Illness:   [name] is followed by Dr. Tamala Julian (previously Dr. Terrence Dupont) for HTN and chronic diastolic HF and previous h/o systolic HF. EF in 2015 was 30-35%, however repeat echo in 2018 showed normalization of EF. She also has h/o PVD and underwent  left superficial femoral artery angioplasty, in the setting of nonhealing left first toe ulcer, by Dr. Bridgett Larsson in September 2013. Unfortunately, she has not been compliant with follow-up w/ vascular surgery. Her last LE arterial duplex was in 2014 and showed ABI on the right was 0.71 and left was 0.87. The left superficial femoral artery was patent with left than 50% stenosis. Also has bilateral carotid artery disease. Most recent dopplers 10/2016 showed less than 40% bilateral ICA obstruction.   Prior to this admission, she had an echocardiogram 11/24/16 that showed normal LVEF with normal wall motion (previously 30-35% in 2015). G2DD was noted. Mild AI and mild MR also noted. She also underwent a NST 10/2016 that was a low risk study with a moderate area of ischemia in the distribution of a posterolateral ventricular branch of the RCA and Lcx. Per Dr. 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 The nuclear study  shows evidence of inferior ischemia. This distribution previously had wall motion abnormality 3 years ago. That has now improved. In absence of symptoms, no further evaluation is indicated. It is possible that there is total occlusion of either the right or distal circumflex with collaterals. Risk factor modification and daily aspirin is recommended". Pt was continued on ASA, high dose statin, BB and ARB and instructed to f/u in 4 months. She had a scheduled f/u with Dr. Tamala Julian on 05/11/2017 but no showed to the appt.   On 06/01/17, pt presented to the [facility] ED with complaints of flu-like symptoms, including nonproductive cough, dyspnea, subjective fever, chills, body aches, fatigue and generalized weakness. In the ED, she was tachycardic, tachypneic and mildly hypoxic requiring 2 L oxygen via nasal cannula and was admitted for observation. She tested + for influenza A and was placed on Tamiflu. Pt also noted to have AKI with slight bump in SCr above her usual baseline. SCr was 1.64 (baseline ~1.1-1.3). She was also felt to be in acute on chronic diastolic HF. BNP was 436.4 on admit. CXR showed some edema and questionable PNA. However, in the absence objective fever and normal white count, no antibiotic initiated. Acute CHF treated with IV Lasix, resulting in gradual improvement in SCr after diuresis. Given acute CHF, a 2D echo was obtained and showed markedly reduced LVEF, compared to previous study 10/2016. EF is now at 25-30% with global hypokinesis. Previous study noted that systolic function was "normal". EKG at time of admit, 1/31, showed  sinus tachycardia w/ septal infarct (age undetermined) and lateral ST & T wave abnormality. No significant change from previous EKGs.   Today, pt notes significant improvement in symptoms since day of admission. She notes that she has had recent chest pain, described as substernal chest pressure, non radiating that occurs with exertion. She also has some exertional dyspnea and  decreased exercise tolerance, particularly walking up stairs. Symptoms improve when she stops to rest. She denies any resting symptoms. She reports full medication compliance. Pt states that she has never had a cardiac cath to her recollection.    Past Medical History:  Diagnosis Date  . Arthritis   . CHF (congestive heart failure) ([place])    2014...@ [facility]  . Diabetes mellitus    diagnosed 2001  . Hypertension   . PVD (peripheral vascular disease) ([place])     Past Surgical History:  Procedure Laterality Date  . ABDOMINAL ANGIOGRAM  12/28/2012  . ABDOMINAL ANGIOGRAM N/A 12/29/2011   Procedure: ABDOMINAL ANGIOGRAM;  Surgeon: Conrad Steele, MD;  Location: [facility] CATH LAB;  Service: Cardiovascular;  Laterality: N/A;  . ABDOMINAL AORTAGRAM N/A 12/21/2012   Procedure: ABDOMINAL Maxcine Ham;  Surgeon: Elam Dutch, MD;  Location: [facility] CATH LAB;  Service: Cardiovascular;  Laterality: N/A;  . TOTAL HIP ARTHROPLASTY Left 08/19/2013   Procedure: TOTAL HIP ARTHROPLASTY;  Surgeon: Kerin Salen, MD;  Location: [place];  Service: Orthopedics;  Laterality: Left;  . TUBAL LIGATION       Home Medications:  Prior to Admission medications   Medication Sig Start Date End Date Taking? Authorizing Provider  amLODipine (NORVASC) 10 MG tablet Take 1 tablet (10 mg total) by mouth daily. 12/14/16  Yes Sid Falcon, MD  aspirin EC 81 MG tablet Take 1 tablet (81 mg total) by mouth daily. IM program, [facility] 04/22/16  Yes Norman Herrlich, MD  atorvastatin (LIPITOR) 40 MG tablet Take 1 tablet (40 mg total) by mouth daily. IM program 01/26/17  Yes Lacroce, Hulen Shouts, MD  BAYER MICROLET LANCETS lancets Check blood sugar three times a day. ICD 10: E11.51 06/16/15  Yes Corky Sox, MD  blood glucose meter kit and supplies Check blood sugar two times a day 05/25/16  Yes Norman Herrlich, MD  Blood Glucose Monitoring Suppl (CONTOUR NEXT EZ MONITOR) w/Device KIT Check blood sugar three times a day. ICD 10: E11.51 06/16/15  Yes  Corky Sox, MD  carvedilol (COREG) 12.5 MG tablet Take 1 tablet (12.5 mg total) by mouth 2 (two) times daily with a meal. IM program, [facility] 01/26/17  Yes Lacroce, Hulen Shouts, MD  diclofenac sodium (VOLTAREN) 1 % GEL Apply 2 g topically 4 (four) times daily. Patient taking differently: Apply 2 g topically 4 (four) times daily as needed (pain).  01/26/17  Yes Lacroce, Hulen Shouts, MD  glucose blood (BAYER CONTOUR NEXT TEST) test strip Check blood sugar three times a day. ICD 10: E11.51 01/26/17  Yes Lacroce, Hulen Shouts, MD  insulin detemir (LEVEMIR) 100 UNIT/ML injection Inject 0.2 mLs (20 Units total) into the skin at bedtime. IM program, [facility] 05/25/17  Yes Lacroce, Hulen Shouts, MD  Insulin Syringe-Needle U-100 31G X 15/64" 0.3 ML MISC Use to take insulin one time a day 05/15/17  Yes Lacroce, Hulen Shouts, MD  loratadine (CLARITIN) 10 MG tablet Take 1 tablet (10 mg total) by mouth daily. Patient taking differently: Take 10 mg by mouth daily as needed for allergies.  06/29/16 06/29/17 Yes Julious Oka  M, MD  losartan (COZAAR) 100 MG tablet Take 1 tablet (100 mg total) by mouth daily. 12/16/16 06/01/17 Yes [name], MD  sitaGLIPtin-metformin (JANUMET) 50-1000 MG tablet Take 1 tablet by mouth 2 (two) times daily with a meal. IM program Patient taking differently: Take 2 tablets by mouth 2 (two) times daily with a meal. IM program 10/20/16  Yes Norman Herrlich, MD  triamcinolone cream (KENALOG) 0.1 % Apply 1 application topically daily as needed for irritation. 05/10/16  Yes [provider]    Inpatient Medications: Scheduled Meds: . amLODipine  10 mg Oral Daily  . aspirin EC  81 mg Oral Daily  . atorvastatin  40 mg Oral Daily  . carvedilol  12.5 mg Oral BID WC  . enoxaparin (LOVENOX) injection  40 mg Subcutaneous Q24H  . insulin aspart  0-15 Units Subcutaneous TID WC  . insulin detemir  17 Units Subcutaneous QHS  . ipratropium-albuterol  3 mL Nebulization BID  . oseltamivir  30 mg  Oral BID   Continuous Infusions:  PRN Meds: acetaminophen **OR** acetaminophen, albuterol, guaiFENesin-dextromethorphan, loratadine  Allergies:    Allergies  Allergen Reactions  . Plavix [Clopidogrel Bisulfate] Rash    Social History:   Social History   Socioeconomic History  . Marital status: Married    Spouse name: Not on file  . Number of children: Not on file  . Years of education: 12,college  . Highest education level: Not on file  Social Needs  . Financial resource strain: Not on file  . Food insecurity - worry: Not on file  . Food insecurity - inability: Not on file  . Transportation needs - medical: Not on file  . Transportation needs - non-medical: Not on file  Occupational History  . Not on file  Tobacco Use  . Smoking status: Never Smoker  . Smokeless tobacco: Never Used  Substance and Sexual Activity  . Alcohol use: No    Alcohol/week: 0.0 oz    Comment: ocassionally  . Drug use: No  . Sexual activity: Yes  Other Topics Concern  . Not on file  Social History Narrative  . Not on file    Family History:    Family History  Problem Relation Age of Onset  . Diabetes Mother      ROS:  Please see the history of present illness.   All other ROS reviewed and negative.     Physical Exam/Data:   Vitals:   06/04/17 2113 06/05/17 0015 06/05/17 0500 06/05/17 0754  BP:  137/73 (!) 116/54 124/64  Pulse:  72 69 67  Resp:  '19 16 15  ' Temp:  98.9 F (37.2 C) 98.9 F (37.2 C) 98.4 F (36.9 C)  TempSrc:  Axillary Axillary Axillary  SpO2: 97% 97% 97% 98%  Weight:   167 lb 12.8 oz (76.1 kg)   Height:        Intake/Output Summary (Last 24 hours) at 06/05/2017 1107 Last data filed at 06/05/2017 0748 Gross per 24 hour  Intake 1040 ml  Output 1225 ml  Net -185 ml   Filed Weights   06/03/17 0423 06/04/17 0615 06/05/17 0500  Weight: 167 lb 12.8 oz (76.1 kg) 169 lb 4.8 oz (76.8 kg) 167 lb 12.8 oz (76.1 kg)   Body mass index is 29.72 kg/m.  General:  Well  nourished, well developed, in no acute distress, dentition in poor repair HEENT: normal Lymph: no adenopathy Neck: no JVD, bilateral carotid bruits, R>L Endocrine:  No thryomegaly Vascular:  No carotid bruits; FA pulses 2+ bilaterally without bruits  Cardiac:  normal S1, S2; RRR; no murmur  Lungs:  Anterior exam + for diffuse expiratory rhonchi and wheezing, posterior exam with decreased BS at the bases bilaterally Abd: soft, nontender, no hepatomegaly  Ext: no edema Musculoskeletal:  No deformities, BUE and BLE strength normal and equal Skin: warm and dry  Neuro:  CNs 2-12 intact, no focal abnormalities noted Psych:  Normal affect   EKG:  The EKG was personally reviewed and demonstrates:  NSR with lateral ST abnormalities unchanged from previous Telemetry:  Telemetry was personally reviewed and demonstrates:  NSR   Relevant CV Studies: 2D Echo 06/02/17 Study Conclusions  - Left ventricle: The cavity size was normal. Wall thickness was   increased in a pattern of severe LVH. Systolic function was   severely reduced. The estimated ejection fraction was in the   range of 25% to 30%. Diffuse hypokinesis. Doppler parameters are   consistent with abnormal left ventricular relaxation (grade 1   diastolic dysfunction). The E/e&' ratio is >15, suggesting   elevated LV filling pressure. Ejection fraction (MOD, 2-plane):   29%. - Aortic valve: Trileaflet; mildly calcified leaflets. There was no   stenosis. There was trivial regurgitation. - Mitral valve: Calcified annulus. Mildly thickened leaflets .   There was trivial regurgitation. - Left atrium: The atrium was mildly dilated. - Right ventricle: The cavity size was mildly dilated. Mildly   reduced systolic function. - Inferior vena cava: The vessel was normal in size. The   respirophasic diameter changes were in the normal range (>= 50%),   consistent with normal central venous pressure. - Pericardium, extracardiac: A trivial  pericardial effusion was   identified.  Impressions:  - LVEF 25-30%, severe LVH, global hypokinesis, grade 1 DD, elevated   LV filling pressure, mildly dilated RV with reduced systolic   function, trivial pericardial effusion.  2D Echo 10/2016 Study Conclusions  - Left ventricle: The cavity size was normal. Systolic function was   normal. Wall motion was normal; there were no regional wall   motion abnormalities. Features are consistent with a pseudonormal   left ventricular filling pattern, with concomitant abnormal   relaxation and increased filling pressure (grade 2 diastolic   dysfunction). Doppler parameters are consistent with high   ventricular filling pressure. - Aortic valve: Moderate focal calcification involving the   noncoronary cusp. There was mild regurgitation. Valve area (VTI):   2.34 cm^2. Valve area (Vmax): 1.61 cm^2. Valve area (Vmean): 1.91   cm^2. Regurgitation pressure half-time: 442 ms. - Mitral valve: Severely calcified annulus. Mildly thickened   leaflets . There was mild regurgitation. - Left atrium: The atrium was mildly dilated. - Tricuspid valve: There was mild regurgitation. - Pulmonic valve: There was trivial regurgitation. - Pulmonary arteries: PA peak pressure: 39 mm Hg (S).  Impressions:  - Compared to prior echo, LVF has normalized and PASP has decreased   from 59mHg to 329mg. The right ventricular systolic pressure   was increased consistent with mild pulmonary hypertension.   Laboratory Data:  Chemistry Recent Labs  Lab 06/03/17 0311 06/04/17 0635 06/05/17 0708  NA 139 140 140  K 3.4* 3.9 3.7  CL 104 105 105  CO2 '25 24 24  ' GLUCOSE 139* 161* 173*  BUN 23* 25* 22*  CREATININE 1.85* 1.45* 1.22*  CALCIUM 8.3* 8.3* 8.5*  GFRNONAA 29* 38* 47*  GFRAA 33* 44* 55*  ANIONGAP '10 11 11    ' No results for input(s):  PROT, ALBUMIN, AST, ALT, ALKPHOS, BILITOT in the last 168 hours. Hematology Recent Labs  Lab 06/03/17 0311  06/04/17 0635 06/05/17 0708  WBC 3.6* 3.9* 4.2  RBC 3.98 3.70* 3.74*  HGB 11.1* 10.4* 10.5*  HCT 35.0* 32.5* 32.8*  MCV 87.9 87.8 87.7  MCH 27.9 28.1 28.1  MCHC 31.7 32.0 32.0  RDW 16.1* 16.1* 15.6*  PLT 169 166 181   Cardiac EnzymesNo results for input(s): TROPONINI in the last 168 hours.  Recent Labs  Lab 06/01/17 0209  TROPIPOC 0.01    BNP Recent Labs  Lab 06/01/17 0157  BNP 436.4*    DDimer No results for input(s): DDIMER in the last 168 hours.  Radiology/Studies:  Dg Chest Port 1 View  Result Date: 06/02/2017 CLINICAL DATA:  Acute respiratory failure EXAM: PORTABLE CHEST 1 VIEW COMPARISON:  Chest radiograph from one day prior. FINDINGS: Stable cardiomediastinal silhouette with top-normal heart size. No pneumothorax. No significant pleural effusions. Patchy consolidation in the mid to lower lungs bilaterally with air bronchograms at the lung bases, slightly improved in both lungs. IMPRESSION: Slight improvement in patchy consolidation in the mid to lower lungs bilaterally with air bronchograms at the lung bases, suggesting a combination of multilobar pneumonia and improving atelectasis. Electronically Signed   By: Ilona Sorrel M.D.   On: 06/02/2017 09:33   Dg Chest Portable 1 View  Result Date: 06/01/2017 CLINICAL DATA:  Dyspnea with wheezing. EXAM: PORTABLE CHEST 1 VIEW COMPARISON:  Radiographs of June 01, 2016. FINDINGS: Stable cardiomediastinal silhouette. No pneumothorax is noted. Worsening bilateral perihilar and basilar airspace opacities are noted concerning for worsening pneumonia or possibly edema. No pneumothorax or pleural effusion is noted. Bony thorax is unremarkable. IMPRESSION: Significantly worsening bilateral perihilar and basilar opacities concerning for worsening pneumonia or edema. Electronically Signed   By: Marijo Conception, M.D.   On: 06/01/2017 15:47    Assessment and Plan:   1. Influenza A: on Tamiflu: management/ supportive care per IM. Pt notes  subjective improvement.   2. Acute on Chronic Combined Systolic and Diastolic CHF: BNP 883 on admit with CXR showing some edema. Appears to be euvolemic by exam. I/Os net net negative 1L since admit. There is some diffuse expiratory rhonchi and wheezing, likely 2/2 to influenza.  Given reduced EF, pt will need to be cautious with dietary sodium intake and may benefit from PRN diuretic at time of discharge.    3. Cardiomyopathy: EF has dropped back down to 25-30%. Diffuse hypokinesis noted. Prior history of systolic HF. EF in 2015 was 30-35%  and improved and noted to be normal on repeat echo 10/2016. Suspect likely NICM given EF had improved in 2018. Recent decline may be 2/2 flu.  Continue medical management for now. Currently on BB therapy w/ Coreg, 12.5 mg BID (if wheezing gets worse, may change to cardioselective metoprolol succinate). Was on ARB therapy w/ Losartan as an outpatient but was placed on hold given AKI. Renal function has improved. Will restart losartan today. Will reassess EF again in several more months.   4. Chest Pain: troponin in the ED was negative. EKG shows lateral ST abnormalities and is unchanged from previous. She is currently CP free. Suspect her recent symptoms are likely 2/2 flu/ respiratory illness. We can reassess her symptoms as an outpatient once she fully recovers. Continue medical management for now w/ ASA, statin and BB.   5. PVD: left superficial femoral artery angioplasty by Dr. Bridgett Larsson in 2015. Has not been compliant with vascular f/u.  Recommend that she re-establish outpatient f/u with Dr. Bridgett Larsson post discharge. Continue ASA and statin therapy.   6. AKI: admit SCr 1.6 (baseline ~1.1-1.3). SCr further increased to 1.85 during admission, but has gradually improved, now down to 1.22. Home Losartan remains on hold. Now that renal function is back to baseline, we will restart Losartan.  7. Carotid Artery Diease: Most recent dopplers 10/2016 showed less than 40% bilateral ICA  obstruction. 50% external carotid obstruction. Continue risk factor modification and management with statin therapy to slow disease progression.    For questions or updates, please contact [place] Please consult [email or web address] for contact info under Cardiology/STEMI.   Signed, Lyda Jester, PA-C  06/05/2017 11:07 AM

## 2017-06-06 LAB — CULTURE, BLOOD (ROUTINE X 2)
CULTURE: NO GROWTH
Culture: NO GROWTH
Special Requests: ADEQUATE
Special Requests: ADEQUATE

## 2017-06-09 ENCOUNTER — Ambulatory Visit (INDEPENDENT_AMBULATORY_CARE_PROVIDER_SITE_OTHER): Payer: Self-pay | Admitting: Internal Medicine

## 2017-06-09 ENCOUNTER — Other Ambulatory Visit: Payer: Self-pay

## 2017-06-09 ENCOUNTER — Encounter: Payer: Self-pay | Admitting: Internal Medicine

## 2017-06-09 DIAGNOSIS — I5042 Chronic combined systolic (congestive) and diastolic (congestive) heart failure: Secondary | ICD-10-CM

## 2017-06-09 DIAGNOSIS — N183 Chronic kidney disease, stage 3 unspecified: Secondary | ICD-10-CM

## 2017-06-09 DIAGNOSIS — Z8719 Personal history of other diseases of the digestive system: Secondary | ICD-10-CM

## 2017-06-09 DIAGNOSIS — Z7982 Long term (current) use of aspirin: Secondary | ICD-10-CM

## 2017-06-09 DIAGNOSIS — Z8709 Personal history of other diseases of the respiratory system: Secondary | ICD-10-CM

## 2017-06-09 DIAGNOSIS — Z79899 Other long term (current) drug therapy: Secondary | ICD-10-CM

## 2017-06-09 DIAGNOSIS — Z09 Encounter for follow-up examination after completed treatment for conditions other than malignant neoplasm: Secondary | ICD-10-CM

## 2017-06-09 NOTE — Progress Notes (Signed)
Internal Medicine Clinic Attending  Case discussed with Dr. Blum at the time of the visit.  We reviewed the resident's history and exam and pertinent patient test results.  I agree with the assessment, diagnosis, and plan of care documented in the resident's note. 

## 2017-06-09 NOTE — Progress Notes (Signed)
   CC: follow up after hospitalization for influenza, acute on chronic biventricular heart failure, and acute on chronic kidney injury  HPI:  [name] is a 62 y.o. with PMH as listed below who presents for follow up after hospitalization for influenza and heart failure exacerbation. Please see the assessment and plans for the status of the patient chronic medical problems.    Past Medical History:  Diagnosis Date  . Arthritis   . CHF (congestive heart failure) ([place])    2014...@ [facility]  . Diabetes mellitus    diagnosed 2001  . Hypertension   . PVD (peripheral vascular disease) ([place])    Review of Systems:  Refer to history of present illness and assessment and plans for pertinent review of systems, all others reviewed and negative  Physical Exam:  Vitals:   06/09/17 1338  BP: 140/70  Pulse: 82  Temp: 98.3 F (36.8 C)  TempSrc: Oral  SpO2: 99%  Weight: 167 lb 12.8 oz (76.1 kg)  Height: 5\' 3"  (1.6 m)   General: no acute distress, appears tired  Cardiac: no murmurs appreciated, RRR, no peripheral edema  Pulm: bibasilar rhonchi which clear with coughing, no wheezing Abd: soft, non tender, non distended   Assessment & Plan:   Chronic congestive heart failure  Admit 1/31 through 2/4 for cough, dyspnea on exertion, myalgia, chills and nausea.  She was flu A positive, had a new O2 requirement and new infiltrates on chest xray. She was treated for flu. She given empiric triple antibiotics and IV furosemide.  Echocardiogram showed LVEF 25-30%, diffuse hypokinesia, severe LVH, and grade 1 diastolic dysfunction, and from an echocardiogram in 2015 which showed LVEF 30-35%, with follow-up echo in July 2018 which showed "normal systolic function" and grade 2 diastolic dysfunction. BNP was 436 and she was without JVD or peripheral edema. Last stress test July 2018- Low risk stress nuclear study with a moderate area of ischemia in the distribution of a posterolateral ventricular branch  of the RCA or LCx. She feels that her cough and feelings of weakness are improving, chills have resolved completely, she has a regular diet. No signs of volume overload on my exam today.  - continue aspirin, atorvastatin 40 mg qd, carvedilol 12.5 mg BId, and losartan 50 mg qd  - follow up with cardiology scheduled later this month   CKD stage 3  Crt at the time of recent admission had worsened to 1.6, this was in the setting of acute heart failure exacerbation. She was given IV lasix and losartan was held and Crt improved back to baseline 1.2 by the time of discharge.  - continue to avoid nephrotoxic agents  - continue regular monitoring for progression of CKD   See Encounters Tab for problem based charting.  Patient discussed with Dr. Lynnae January

## 2017-06-09 NOTE — Assessment & Plan Note (Addendum)
Admit 1/31 through 2/4 for cough, dyspnea on exertion, myalgia, chills and nausea.  She was flu A positive, had a new O2 requirement and new infiltrates on chest xray. She was treated for flu. She given empiric triple antibiotics and IV furosemide.  Echocardiogram showed LVEF 25-30%, diffuse hypokinesia, severe LVH, and grade 1 diastolic dysfunction, and from an echocardiogram in 2015 which showed LVEF 30-35%, with follow-up echo in July 2018 which showed "normal systolic function" and grade 2 diastolic dysfunction. BNP was 436 and she was without JVD or peripheral edema. Last stress test July 2018- Low risk stress nuclear study with a moderate area of ischemia in the distribution of a posterolateral ventricular branch of the RCA or LCx. She feels that her cough and feelings of weakness are improving, chills have resolved completely, she has a regular diet. No signs of volume overload on my exam today.  - continue aspirin, atorvastatin 40 mg qd, carvedilol 12.5 mg BId, and losartan 50 mg qd  - follow up with cardiology scheduled later this month

## 2017-06-09 NOTE — Assessment & Plan Note (Signed)
Crt at the time of recent admission had worsened to 1.6, this was in the setting of acute heart failure exacerbation. She was given IV lasix and losartan was held and Crt improved back to baseline 1.2 by the time of discharge.  - continue to avoid nephrotoxic agents  - continue regular monitoring for progression of CKD

## 2017-06-09 NOTE — Patient Instructions (Addendum)
It was a sure to meet you today [name],  Please continue to take your aspirin, atorvastatin, carvedilol 12.5 mg twice daily, and losartan 50 mg daily  Have a follow-up appointment scheduled on February 28 with your cardiologist  FOLLOW-UP INSTRUCTIONS When: 1-3 months with Dr. Aggie Hacker  For: regular check up  What to bring: medication bottles

## 2017-06-23 ENCOUNTER — Other Ambulatory Visit: Payer: Self-pay | Admitting: Internal Medicine

## 2017-06-23 DIAGNOSIS — E1151 Type 2 diabetes mellitus with diabetic peripheral angiopathy without gangrene: Secondary | ICD-10-CM

## 2017-06-23 DIAGNOSIS — E1165 Type 2 diabetes mellitus with hyperglycemia: Principal | ICD-10-CM

## 2017-06-23 DIAGNOSIS — IMO0002 Reserved for concepts with insufficient information to code with codable children: Secondary | ICD-10-CM

## 2017-06-23 MED FILL — CARVEDILOL 12.5 MG TABLET: 12.5 | 30 days supply | Qty: 60 | Fill #3

## 2017-06-23 MED FILL — LEVEMIR 100 UNITS/ML VIAL: 100 | 28 days supply | Qty: 10 | Fill #1

## 2017-06-23 MED FILL — ATORVASTATIN 40 MG TABLET: 40 | 30 days supply | Qty: 30 | Fill #0

## 2017-06-23 MED FILL — LOSARTAN POTASSIUM 100 MG T: 100 | 30 days supply | Qty: 30 | Fill #4

## 2017-06-23 MED FILL — JANUMET 50-1,000 MG TABLET: 50-1000 | 30 days supply | Qty: 60 | Fill #7

## 2017-06-23 NOTE — Telephone Encounter (Signed)
Next appt scheduled 4/10 with PCP. 

## 2017-06-28 NOTE — Progress Notes (Signed)
[facility]   Cardiology Office Note   Date:  06/29/2017   ID:  HENLEY BLYTH, DOB 1956/03/15, MRN [id number]  PCP:  Melanee Spry, MD  Cardiologist:  Dr. Tamala Julian    Chief Complaint  Patient presents with  . Hospitalization Follow-up    CHF       History of Present Illness: [name] is a 62 y.o. female who presents for post hospitalization 06/01/17 to 06/05/17 for flu and developed HF.   She has a hx of HTN, DM, PVD, chronic diastolic HF, prior h/o low EF in 2015 30-35% (EF improved back to normal in 2018) and h/o abnormal NST but no h/o cardiac cath,  Recently admitted by IM for influenza A and acute CHF. 2D echo shows reduced LVEF at 25-30%. She also had chest pain-prior to d/c restarted on losartan 50,  Had been on 100 mg  Wt at discharge was 167 lbs.    Last nuc 10/2016 with no ischemia    Today no chest pain, no SOB except with exertion going from car to home with groceries and her energy has not come back to full strength yet.  She cares for her grandchild.   No edema.  Overall feeling better. Glucose is stable.  She does not have scales but is watching her salt intake.  He reviewed her cardiomyopathy and will need repeat echo in 2-3 months.  Her BP is stable and if Cr remaining normal would like to increase her losartan to 100 mg her pre hospital dose.   Past Medical History:  Diagnosis Date  . Arthritis   . CHF (congestive heart failure) ([place])    2014...@ [facility]  . Diabetes mellitus    diagnosed 2001  . Hypertension   . PVD (peripheral vascular disease) ([place])     Past Surgical History:  Procedure Laterality Date  . ABDOMINAL ANGIOGRAM  12/28/2012  . ABDOMINAL ANGIOGRAM N/A 12/29/2011   Procedure: ABDOMINAL ANGIOGRAM;  Surgeon: Conrad Monroe, MD;  Location: [facility] CATH LAB;  Service: Cardiovascular;  Laterality: N/A;  . ABDOMINAL AORTAGRAM N/A 12/21/2012   Procedure: ABDOMINAL Maxcine Ham;  Surgeon: Elam Dutch, MD;  Location: [facility] CATH LAB;  Service: Cardiovascular;   Laterality: N/A;  . TOTAL HIP ARTHROPLASTY Left 08/19/2013   Procedure: TOTAL HIP ARTHROPLASTY;  Surgeon: Kerin Salen, MD;  Location: [place];  Service: Orthopedics;  Laterality: Left;  . TUBAL LIGATION       Current Outpatient Medications  Medication Sig Dispense Refill  . amLODipine (NORVASC) 10 MG tablet Take 1 tablet (10 mg total) by mouth daily. 90 tablet 1  . aspirin EC 81 MG tablet Take 1 tablet (81 mg total) by mouth daily. IM program, hope fund 30 tablet 11  . atorvastatin (LIPITOR) 40 MG tablet TAKE 1 TABLET (40 MG TOTAL) BY MOUTH DAILY. 30 tablet 3  . BAYER MICROLET LANCETS lancets Check blood sugar three times a day. ICD 10: E11.51 100 each 12  . blood glucose meter kit and supplies Check blood sugar two times a day 1 each 0  . Blood Glucose Monitoring Suppl (CONTOUR NEXT EZ MONITOR) w/Device KIT Check blood sugar three times a day. ICD 10: E11.51 1 kit 0  . carvedilol (COREG) 12.5 MG tablet Take 1 tablet (12.5 mg total) by mouth 2 (two) times daily with a meal. IM program, hope fund 60 tablet 6  . diclofenac sodium (VOLTAREN) 1 % GEL Apply 2 g topically 4 (four) times daily as needed (pain).    Marland Kitchen  glucose blood (BAYER CONTOUR NEXT TEST) test strip Check blood sugar three times a day. ICD 10: E11.51 100 each 12  . guaiFENesin-dextromethorphan (ROBITUSSIN DM) 100-10 MG/5ML syrup Take 5 mLs by mouth every 4 (four) hours as needed for cough. 118 mL 0  . insulin detemir (LEVEMIR) 100 UNIT/ML injection Inject 0.2 mLs (20 Units total) into the skin at bedtime. IM program, hope fund 10 mL 11  . Insulin Syringe-Needle U-100 31G X 15/64" 0.3 ML MISC Use to take insulin one time a day 100 each 12  . loratadine (CLARITIN) 10 MG tablet Take 10 mg by mouth daily as needed for allergies.    Marland Kitchen losartan (COZAAR) 50 MG tablet Take 1 tablet (50 mg total) by mouth daily. 30 tablet 0  . sitaGLIPtin-metformin (JANUMET) 50-1000 MG tablet Take 1 tablet by mouth 2 (two) times daily with a meal.    .  traMADol (ULTRAM) 50 MG tablet Take 50 mg by mouth every 12 (twelve) hours as needed for moderate pain.    Marland Kitchen triamcinolone cream (KENALOG) 0.1 % Apply 1 application topically daily as needed for irritation.  1   No current facility-administered medications for this visit.     Allergies:   Lisinopril and Plavix [clopidogrel bisulfate]    Social History:  The patient  reports that  has never smoked. she has never used smokeless tobacco. She reports that she does not drink alcohol or use drugs.   Family History:  The patient's family history includes Diabetes in her mother.    ROS:  General:no colds or fevers, + weight increase from hospital but with clothes and shoes in place. Skin:no rashes or ulcers HEENT:no blurred vision, no congestion CV:see HPI PUL:see HPI GI:no diarrhea constipation or melena, no indigestion GU:no hematuria, no dysuria MS:no joint pain, no claudication Neuro:no syncope, no lightheadedness Endo:+ diabetes stable., no thyroid disease  Wt Readings from Last 3 Encounters:  06/29/17 172 lb (78 kg)  06/09/17 167 lb 12.8 oz (76.1 kg)  06/05/17 167 lb 12.8 oz (76.1 kg)     PHYSICAL EXAM: VS:  BP 122/70   Pulse 79   Ht '5\' 3"'$  (1.6 m)   Wt 172 lb (78 kg)   BMI 30.47 kg/m  , BMI Body mass index is 30.47 kg/m. General:Pleasant affect, NAD Skin:Warm and dry, brisk capillary refill HEENT:normocephalic, sclera clear, mucus membranes moist Neck:supple, no JVD, no bruits  Heart:S1S2 RRR without murmur, gallup, rub or click Lungs:clear without rales, rhonchi, or wheezes VOJ:JKKX, non tender, + BS, do not palpate liver spleen or masses Ext:no lower ext edema, 2+ pedal pulses, 2+ radial pulses Neuro:alert and oriented X 3, MAE, follows commands, + facial symmetry    EKG:  EKG is NOT ordered today. The ekg from hospital with no acute changes   Recent Labs: 06/01/2017: B Natriuretic Peptide 436.4 06/05/2017: BUN 22; Creatinine, Ser 1.22; Hemoglobin 10.5; Platelets  181; Potassium 3.7; Sodium 140    Lipid Panel    Component Value Date/Time   CHOL 168 11/08/2012 1331   TRIG 95 11/08/2012 1331   HDL 45 11/08/2012 1331   CHOLHDL 3.7 11/08/2012 1331   VLDL 19 11/08/2012 1331   LDLCALC 104 (H) 11/08/2012 1331       Other studies Reviewed: Additional studies/ records that were reviewed today include: . 06/02/17 Echo  Study Conclusions  - Left ventricle: The cavity size was normal. Wall thickness was   increased in a pattern of severe LVH. Systolic function was   severely  reduced. The estimated ejection fraction was in the   range of 25% to 30%. Diffuse hypokinesis. Doppler parameters are   consistent with abnormal left ventricular relaxation (grade 1   diastolic dysfunction). The E/e&' ratio is >15, suggesting   elevated LV filling pressure. Ejection fraction (MOD, 2-plane):   29%. - Aortic valve: Trileaflet; mildly calcified leaflets. There was no   stenosis. There was trivial regurgitation. - Mitral valve: Calcified annulus. Mildly thickened leaflets .   There was trivial regurgitation. - Left atrium: The atrium was mildly dilated. - Right ventricle: The cavity size was mildly dilated. Mildly   reduced systolic function. - Inferior vena cava: The vessel was normal in size. The   respirophasic diameter changes were in the normal range (>= 50%),   consistent with normal central venous pressure. - Pericardium, extracardiac: A trivial pericardial effusion was   identified.  Impressions:  - LVEF 25-30%, severe LVH, global hypokinesis, grade 1 DD, elevated   LV filling pressure, mildly dilated RV with reduced systolic   function, trivial pericardial effusion.  nuc study 10/2016  Study Highlights     Nuclear stress EF: 57%.  There was no ST segment deviation noted during stress.  Defect 1: There is a medium defect of mild severity present in the basal inferolateral, mid inferolateral and apical lateral location.  This is a low  risk study.  Findings consistent with ischemia.   Low risk stress nuclear study with a moderate area of ischemia in the distribution of a posterolateral ventricular branch of the RCA or LCx. Normal left ventricular regional and global systolic function.      Echo 11/24/16 Study Conclusions  - Left ventricle: The cavity size was normal. Systolic function was   normal. Wall motion was normal; there were no regional wall   motion abnormalities. Features are consistent with a pseudonormal   left ventricular filling pattern, with concomitant abnormal   relaxation and increased filling pressure (grade 2 diastolic   dysfunction). Doppler parameters are consistent with high   ventricular filling pressure. - Aortic valve: Moderate focal calcification involving the   noncoronary cusp. There was mild regurgitation. Valve area (VTI):   2.34 cm^2. Valve area (Vmax): 1.61 cm^2. Valve area (Vmean): 1.91   cm^2. Regurgitation pressure half-time: 442 ms. - Mitral valve: Severely calcified annulus. Mildly thickened   leaflets . There was mild regurgitation. - Left atrium: The atrium was mildly dilated. - Tricuspid valve: There was mild regurgitation. - Pulmonic valve: There was trivial regurgitation. - Pulmonary arteries: PA peak pressure: 39 mm Hg (S).  Impressions:  - Compared to prior echo, LVF has normalized and PASP has decreased   from 19mHg to 321mg. The right ventricular systolic pressure   was increased consistent with mild pulmonary hypertension.  ASSESSMENT AND PLAN:  1.  CHF with EF down again to 25-30% - euvolemic - no further chest discomfort.  Some DOE but energy is not yet back to baseline.--chrnoic combined systolic and diastolic HF.  Follow up with myself or Dr. SmTamala Juliann 2-3 months.  2.  Cardiomyopathy with Ef 25-30% - had come up to normal previously.  Presumed NICM with normal nuc study  3   Recovering from flu - still has not gotten strength back   4.   ACE  inhibitor cough  5.   CKD- 3 - recheck  BMP - if Cr stable would increase her losartan to 100 mg.   And recheck labs in 2 weeks.   6.  HTN controlled  7.    DM -2 per PCP   Current medicines are reviewed with the patient today.  The patient Has no concerns regarding medicines.  The following changes have been made:  See above Labs/ tests ordered today include:see above  Disposition:   FU:  see above  Signed, Cecilie Kicks, NP  06/29/2017 9:09 AM    [address] [place], [place], [place], [place] Phone: [phone]; Fax: [phone]

## 2017-06-29 ENCOUNTER — Ambulatory Visit (INDEPENDENT_AMBULATORY_CARE_PROVIDER_SITE_OTHER): Payer: No Typology Code available for payment source | Admitting: Cardiology

## 2017-06-29 ENCOUNTER — Encounter: Payer: Self-pay | Admitting: Cardiology

## 2017-06-29 VITALS — BP 122/70 | HR 79 | Ht 63.0 in | Wt 172.0 lb

## 2017-06-29 DIAGNOSIS — R05 Cough: Secondary | ICD-10-CM

## 2017-06-29 DIAGNOSIS — T464X5A Adverse effect of angiotensin-converting-enzyme inhibitors, initial encounter: Secondary | ICD-10-CM

## 2017-06-29 DIAGNOSIS — R058 Other specified cough: Secondary | ICD-10-CM

## 2017-06-29 DIAGNOSIS — IMO0002 Reserved for concepts with insufficient information to code with codable children: Secondary | ICD-10-CM

## 2017-06-29 DIAGNOSIS — E1165 Type 2 diabetes mellitus with hyperglycemia: Secondary | ICD-10-CM

## 2017-06-29 DIAGNOSIS — E1151 Type 2 diabetes mellitus with diabetic peripheral angiopathy without gangrene: Secondary | ICD-10-CM

## 2017-06-29 DIAGNOSIS — I5042 Chronic combined systolic (congestive) and diastolic (congestive) heart failure: Secondary | ICD-10-CM

## 2017-06-29 DIAGNOSIS — I10 Essential (primary) hypertension: Secondary | ICD-10-CM

## 2017-06-29 DIAGNOSIS — N289 Disorder of kidney and ureter, unspecified: Secondary | ICD-10-CM

## 2017-06-29 LAB — BASIC METABOLIC PANEL
BUN/Creatinine Ratio: 15 (ref 12–28)
BUN: 21 mg/dL (ref 8–27)
CALCIUM: 9.3 mg/dL (ref 8.7–10.3)
CO2: 21 mmol/L (ref 20–29)
CREATININE: 1.39 mg/dL — AB (ref 0.57–1.00)
Chloride: 108 mmol/L — ABNORMAL HIGH (ref 96–106)
GFR calc Af Amer: 48 mL/min/{1.73_m2} — ABNORMAL LOW (ref >59)
GFR, EST NON AFRICAN AMERICAN: 41 mL/min/{1.73_m2} — AB (ref >59)
Glucose: 112 mg/dL — ABNORMAL HIGH (ref 65–99)
Potassium: 4.1 mmol/L (ref 3.5–5.2)
Sodium: 144 mmol/L (ref 134–144)

## 2017-06-29 NOTE — Patient Instructions (Addendum)
Medication Instructions:  Your physician recommends that you continue on your current medications as directed. Please refer to the Current Medication list given to you today.  Labwork: BMET today  Testing/Procedures: None  Follow-Up: Your physician recommends that you schedule a follow-up appointment in: 2-3 months with Cecilie Kicks, NP on a day that Dr. Tamala Julian is in the office.    Any Other Special Instructions Will Be Listed Below (If Applicable).     If you need a refill on your cardiac medications before your next appointment, please call your pharmacy.

## 2017-07-12 ENCOUNTER — Other Ambulatory Visit: Payer: Self-pay | Admitting: Internal Medicine

## 2017-07-12 MED FILL — AMLODIPINE BESYLATE 10 MG T: 10 | 30 days supply | Qty: 30 | Fill #0

## 2017-07-31 MED FILL — ATORVASTATIN 40 MG TABLET: 40 | 30 days supply | Qty: 30 | Fill #1

## 2017-07-31 MED FILL — JANUMET 50-1,000 MG TABLET: 50-1000 | 30 days supply | Qty: 60 | Fill #8

## 2017-07-31 MED FILL — LEVEMIR 100 UNITS/ML VIAL: 100 | 28 days supply | Qty: 10 | Fill #2

## 2017-07-31 MED FILL — CARVEDILOL 12.5 MG TABLET: 12.5 | 30 days supply | Qty: 60 | Fill #4

## 2017-07-31 MED FILL — LOSARTAN POTASSIUM 100 MG T: 100 | 30 days supply | Qty: 30 | Fill #5

## 2017-08-09 ENCOUNTER — Encounter: Payer: Self-pay | Admitting: Internal Medicine

## 2017-08-11 ENCOUNTER — Other Ambulatory Visit: Payer: Self-pay

## 2017-08-11 ENCOUNTER — Encounter: Payer: Self-pay | Admitting: Internal Medicine

## 2017-08-11 ENCOUNTER — Ambulatory Visit (INDEPENDENT_AMBULATORY_CARE_PROVIDER_SITE_OTHER): Payer: Self-pay | Admitting: Internal Medicine

## 2017-08-11 VITALS — BP 124/55 | HR 77 | Temp 98.3°F | Ht 60.3 in | Wt 171.0 lb

## 2017-08-11 DIAGNOSIS — I5042 Chronic combined systolic (congestive) and diastolic (congestive) heart failure: Secondary | ICD-10-CM

## 2017-08-11 DIAGNOSIS — E1151 Type 2 diabetes mellitus with diabetic peripheral angiopathy without gangrene: Secondary | ICD-10-CM

## 2017-08-11 DIAGNOSIS — Z79891 Long term (current) use of opiate analgesic: Secondary | ICD-10-CM

## 2017-08-11 DIAGNOSIS — E1165 Type 2 diabetes mellitus with hyperglycemia: Principal | ICD-10-CM

## 2017-08-11 DIAGNOSIS — E1122 Type 2 diabetes mellitus with diabetic chronic kidney disease: Secondary | ICD-10-CM

## 2017-08-11 DIAGNOSIS — Z79899 Other long term (current) drug therapy: Secondary | ICD-10-CM

## 2017-08-11 DIAGNOSIS — Z1231 Encounter for screening mammogram for malignant neoplasm of breast: Secondary | ICD-10-CM

## 2017-08-11 DIAGNOSIS — R197 Diarrhea, unspecified: Secondary | ICD-10-CM

## 2017-08-11 DIAGNOSIS — IMO0002 Reserved for concepts with insufficient information to code with codable children: Secondary | ICD-10-CM

## 2017-08-11 DIAGNOSIS — Z1211 Encounter for screening for malignant neoplasm of colon: Secondary | ICD-10-CM

## 2017-08-11 DIAGNOSIS — M160 Bilateral primary osteoarthritis of hip: Secondary | ICD-10-CM

## 2017-08-11 DIAGNOSIS — N189 Chronic kidney disease, unspecified: Secondary | ICD-10-CM

## 2017-08-11 DIAGNOSIS — Z794 Long term (current) use of insulin: Secondary | ICD-10-CM

## 2017-08-11 DIAGNOSIS — I130 Hypertensive heart and chronic kidney disease with heart failure and stage 1 through stage 4 chronic kidney disease, or unspecified chronic kidney disease: Secondary | ICD-10-CM

## 2017-08-11 DIAGNOSIS — Z0000 Encounter for general adult medical examination without abnormal findings: Secondary | ICD-10-CM

## 2017-08-11 DIAGNOSIS — R0981 Nasal congestion: Secondary | ICD-10-CM

## 2017-08-11 DIAGNOSIS — M1611 Unilateral primary osteoarthritis, right hip: Secondary | ICD-10-CM

## 2017-08-11 DIAGNOSIS — I10 Essential (primary) hypertension: Secondary | ICD-10-CM

## 2017-08-11 LAB — POCT GLYCOSYLATED HEMOGLOBIN (HGB A1C): Hemoglobin A1C: 7

## 2017-08-11 LAB — GLUCOSE, CAPILLARY: Glucose-Capillary: 157 mg/dL — ABNORMAL HIGH (ref 65–99)

## 2017-08-11 MED ORDER — LOSARTAN POTASSIUM 100 MG PO TABS: 100.0000 mg | ORAL_TABLET | Freq: Every day | ORAL | 0 refills | Status: DC

## 2017-08-11 MED ORDER — INSULIN DETEMIR 100 UNIT/ML ~~LOC~~ SOLN: 15.0000 [IU] | Freq: Every day | SUBCUTANEOUS | 11 refills | Status: DC

## 2017-08-11 MED ORDER — TRAMADOL HCL 50 MG PO TABS: 50.0000 mg | ORAL_TABLET | Freq: Two times a day (BID) | ORAL | 0 refills | Status: DC | PRN

## 2017-08-11 MED ORDER — LOSARTAN POTASSIUM 100 MG PO TABS: 100.0000 mg | ORAL_TABLET | Freq: Every day | ORAL | 3 refills | Status: DC

## 2017-08-11 MED ORDER — INSULIN DETEMIR 100 UNIT/ML ~~LOC~~ SOLN: 20.0000 [IU] | Freq: Every day | SUBCUTANEOUS | 11 refills | Status: DC

## 2017-08-11 MED FILL — traMADol HCL 50 MG TABS: 50 | 30 days supply | Qty: 60 | Fill #0

## 2017-08-11 NOTE — Assessment & Plan Note (Addendum)
Patient complaining of aching pain in her right hip. The pain is worse with activity. Pain is associated with stiffness. She has a history of osteoarthritis of both hips. She was previously doing PT, which was helping. The orange card only allows for limited PT sessions and the patient is unable to afford the out of pocket expense of continuing PT. She states her pain affects her daily activities. She says it causes her to feel down because she is unable to things she used to do/wants to do, but also impairs her from taking care of her grandchild and spending time with her husband. She uses a cane to ambulate. The patient also asked about getting evaluated for disability.   On PE the patient has an abnormal gait with a limp, more weight bearing on her right hip. No obvious deformity. Limited ROM with external and internal fixation. Strength and sensation intact.   Limited with treatment options at this point. Because the patients pain is disabling and affecting everyday quality of life I think it is reasonable to start Tramadol in the setting of her CKD (no NSAIDs). The patient has taken this before and says it helped make the pain more tolerable. She has reasonable expectations and understands that the pain medication will not eliminate the pain. Her goal is to improve her daily quality of life and functioning.  Plan:  -Start Tramadol 50 mg BID PRN - went over pain contract with the patient; the patient expressed understanding and agreement with the [facility] controlled medication agreement contract -Prescribed 60 tablets monthly, 0 refills -Will look into getting the patient referred for disability assessment as this is not done in our clinic

## 2017-08-11 NOTE — Assessment & Plan Note (Signed)
Mammogram referral resent. Patient states she was placed on a wait list and never heard back from the imaging center.   Will give stool card today. She states she did not hear back from GI about colonoscopy. She is agreeable to stool card testing.

## 2017-08-11 NOTE — Progress Notes (Signed)
   CC: hypertension, type 2 DM  HPI:  [name] is a 62 y.o. female with PMH as documented below presenting for follow up of her diabetes and hypertension. Please see encounter based charting for a detailed description of the patient's acute and chronic medical problems.   Past Medical History:  Diagnosis Date  . Arthritis   . CHF (congestive heart failure) ([place])    2014...@ [facility]  . Diabetes mellitus    diagnosed 2001  . Hypertension   . PVD (peripheral vascular disease) ([place])    Review of Systems:   Review of Systems  Constitutional: Negative for chills and fever.  HENT: Positive for congestion (allergy related).   Respiratory: Negative for cough and shortness of breath.   Cardiovascular: Negative for chest pain and leg swelling.  Gastrointestinal: Positive for diarrhea. Negative for blood in stool, constipation, melena, nausea and vomiting. Abdominal pain: intermittent.  Genitourinary: Negative for dysuria and hematuria.  Musculoskeletal: Positive for joint pain.  Skin: Negative.   Neurological: Negative.   Psychiatric/Behavioral: Negative.     Physical Exam:  Vitals:   08/11/17 1333  BP: (!) 124/55  Pulse: 77  Temp: 98.3 F (36.8 C)  TempSrc: Oral  SpO2: 100%  Weight: 171 lb (77.6 kg)  Height: 5' 0.3" (1.532 m)   Physical Exam  Constitutional: She is oriented to person, place, and time. She appears well-developed and well-nourished. No distress.  HENT:  Head: Normocephalic and atraumatic.  Neck: Normal range of motion. Neck supple. No JVD present. No thyromegaly present.  Cardiovascular: Normal rate, regular rhythm and normal heart sounds.  Pulmonary/Chest: Effort normal and breath sounds normal. No respiratory distress. She has no rales.  Abdominal: Soft. Bowel sounds are normal. There is no tenderness.  Musculoskeletal: She exhibits no edema.       Right hip: She exhibits decreased range of motion (internal and external rotation ). She exhibits normal  strength, no tenderness, no bony tenderness and no deformity.  Pain elicited in right hip with active and passive internal and external rotation  Neurological: She is alert and oriented to person, place, and time. No cranial nerve deficit. Gait abnormal.  Skin: Skin is warm and dry.    Assessment & Plan:   See Encounters Tab for problem based charting.  Patient discussed with Dr. Rebeca Alert

## 2017-08-11 NOTE — Patient Instructions (Addendum)
[name],   It was nice seeing you today. I am happy you are feeling better since your hospitalization.  Continue to take all medications as prescribed. You can continue to take Janumet once daily. I want to recheck you hemoglobin A1C in 3 months if your A1C increased we will need to restart twice daily dosing.   I have prescribed you Tramadol 50 mg twice daily, a total of 60 tablets for 1 month supply for your hip pain. You can take these as needed.   I will work on the referral for disability assessment.   I have sent in the referral for mammogram.

## 2017-08-11 NOTE — Assessment & Plan Note (Addendum)
A1C 7.0 today. A1C 11/2016 6.5.  Patient checks her BG once daily in the morning prior to breakfast. BG at goal. Average pre-prandial 108. She denies hypoglycemic events or symptoms. She has only been taking Janumet daily, not BID. She states she wanted to try and see how her A1C did only taking the medication once daily. She was having no adverse side effects. She is taking 20 units of Levemir daily.  Plan: -Continue Levemir 20 units daily -Continue Janumet daily - discussed with patient that if her A1C increases at next visit will need to restart BID dosing -RTC 3 months for repeat A1C

## 2017-08-11 NOTE — Assessment & Plan Note (Addendum)
BP Readings from Last 3 Encounters:  08/11/17 (!) 124/55  06/29/17 122/70  06/09/17 140/70   Patient's BP well controlled on current medication regimen. Current regimen includes: Losartan 50 mg daily, amlodipine 10 mg daily, Carvedilol 12.5 mg daily. Patient was admitted for influenza in 06/2017, at that time she also had AKI on CKD so her losartan dose was decreased to 50 mg daily. Per Cad[facility] recs, would like to resume her pre-hospitalization losartan dose of 100 mg daily due to chronic combined CHF. Repeat Cr 1.39 (Baseline 1.2-1.3.) at cardiology visit, so they chose not to restart her 100 mg losartan dose. The patient has not been taking 50 mg of Losartan, she has been taking 100 mg daily.   Plan: -Continue Amlodipine 10 mg daily.  -Continue Carvedilol 12.5 mg daily -Continue Losartan 100 mg daily  -BMET next visit

## 2017-08-11 NOTE — Assessment & Plan Note (Addendum)
Patient follows with Heart Failure clinic. Most recent ECHO LVEF 25-30%, severe LVH, global hypokinesis, grade 1 DD, elevated LV filling pressure, mildly dilated RV with reduced systolic function, trivial pericardial effusion. She denies SOB, chest pain, or lower extremity swelling. Feels much improved since hospitalization in 06/2017. Euvolemic on examination today.   Plan: -Plans to f/u with Cardiology in 1 month -continue carvedilol 12.5 mg BID -Continue losartan 100 mg daily -Continue to monitor BP for BP goal of <130/80

## 2017-08-20 NOTE — Progress Notes (Signed)
Internal Medicine Clinic Attending  Case discussed with Dr. Aggie Hacker  at the time of the visit.  We reviewed the resident's history and exam and pertinent patient test results.  I agree with the assessment, diagnosis, and plan of care documented in the resident's note.  Oda Kilts, MD

## 2017-08-21 ENCOUNTER — Encounter: Payer: Self-pay | Admitting: Cardiology

## 2017-08-23 ENCOUNTER — Other Ambulatory Visit: Payer: Self-pay

## 2017-08-23 DIAGNOSIS — Z1211 Encounter for screening for malignant neoplasm of colon: Secondary | ICD-10-CM

## 2017-08-24 LAB — FECAL OCCULT BLOOD, IMMUNOCHEMICAL: Fecal Occult Bld: NEGATIVE

## 2017-08-28 MED FILL — ATORVASTATIN 40 MG TABLET: 40 | 30 days supply | Qty: 30 | Fill #2

## 2017-08-28 MED FILL — AMLODIPINE BESYLATE 10 MG T: 10 | 30 days supply | Qty: 30 | Fill #1

## 2017-08-28 MED FILL — CARVEDILOL 12.5 MG TABLET: 12.5 | 30 days supply | Qty: 60 | Fill #5

## 2017-08-29 NOTE — Progress Notes (Signed)
Cardiology Office Note:    Date:  08/31/2017   ID:  [name], DOB September 30, 1955, MRN [id number]  PCP:  Melanee Spry, MD  Cardiologist:  Sinclair Grooms, MD   Referring MD: Melanee Spry, MD   Chief Complaint  Patient presents with  . Follow-up    chronic systolic and diastolic heart failure    History of Present Illness:    [name] is a 62 y.o. female with a hx of HTN, DM, PVD, chronic diastolic heart failure, prior history of low EF in 2015 of 30-35%, improved EF in 2018, and history of abnormal NST but no hx of cardiac cath. She was admitted 06/01/17-06/05/17 for influenza A. Echo that admission showed reduced LVEF of 25-30%. He also had chest pain prior to discharge and was restarted on a lower dose of losartan 50 mg (was on 100 mg). Her discharge weight was 167 lbs. Nuclear stress test on 10/2016 without ischemia.   She was seen in follow up by Cecilie Kicks NP on 06/29/17. At that time, she was doing well with stable BP and sCr. Losartan was increased back to her 100 mg dose. She was euvolemic on exam at that time but complaining of low energy.   She presents today for follow up. She is compliant on coreg, norvasc, losartan, ASA and lipitor. She is euvolemic on exam. No bleeding problems. She denies chest pain, shortness of breath, orthopnea, lower extremity swelling, dizziness, and syncope. Her DOE is much improved from her influenza. Overall, she feels quite well. Her pressure is a bit elevated here, but generally runs 120/70 at home. She is doing well without diuretic regimen.     Past Medical History:  Diagnosis Date  . Arthritis   . CHF (congestive heart failure) ([place])    2014...@ [facility]  . Diabetes mellitus    diagnosed 2001  . Hypertension   . PVD (peripheral vascular disease) ([place])     Past Surgical History:  Procedure Laterality Date  . ABDOMINAL ANGIOGRAM  12/28/2012  . ABDOMINAL ANGIOGRAM N/A 12/29/2011   Procedure: ABDOMINAL ANGIOGRAM;   Surgeon: Conrad Naranjito, MD;  Location: [facility] CATH LAB;  Service: Cardiovascular;  Laterality: N/A;  . ABDOMINAL AORTAGRAM N/A 12/21/2012   Procedure: ABDOMINAL Maxcine Ham;  Surgeon: Elam Dutch, MD;  Location: [facility] CATH LAB;  Service: Cardiovascular;  Laterality: N/A;  . TOTAL HIP ARTHROPLASTY Left 08/19/2013   Procedure: TOTAL HIP ARTHROPLASTY;  Surgeon: Kerin Salen, MD;  Location: [place];  Service: Orthopedics;  Laterality: Left;  . TUBAL LIGATION      Current Medications: Current Meds  Medication Sig  . amLODipine (NORVASC) 10 MG tablet TAKE 1 TABLET (10 MG TOTAL) BY MOUTH DAILY.  Marland Kitchen aspirin EC 81 MG tablet Take 1 tablet (81 mg total) by mouth daily. IM program, [facility]  . atorvastatin (LIPITOR) 40 MG tablet TAKE 1 TABLET (40 MG TOTAL) BY MOUTH DAILY.  Marland Kitchen BAYER MICROLET LANCETS lancets Check blood sugar three times a day. ICD 10: E11.51  . blood glucose meter kit and supplies Check blood sugar two times a day  . Blood Glucose Monitoring Suppl (CONTOUR NEXT EZ MONITOR) w/Device KIT Check blood sugar three times a day. ICD 10: E11.51  . carvedilol (COREG) 12.5 MG tablet Take 1 tablet (12.5 mg total) by mouth 2 (two) times daily with a meal. IM program, [facility]  . diclofenac sodium (VOLTAREN) 1 % GEL Apply 2 g topically 4 (four) times daily  as needed (pain).  Marland Kitchen glucose blood (BAYER CONTOUR NEXT TEST) test strip Check blood sugar three times a day. ICD 10: E11.51  . guaiFENesin-dextromethorphan (ROBITUSSIN DM) 100-10 MG/5ML syrup Take 5 mLs by mouth every 4 (four) hours as needed for cough.  . insulin detemir (LEVEMIR) 100 UNIT/ML injection Inject 0.2 mLs (20 Units total) into the skin at bedtime. IM program, [facility]  . Insulin Syringe-Needle U-100 31G X 15/64" 0.3 ML MISC Use to take insulin one time a day  . loratadine (CLARITIN) 10 MG tablet Take 10 mg by mouth daily as needed for allergies.  Marland Kitchen losartan (COZAAR) 100 MG tablet Take 1 tablet (100 mg total) by mouth daily.  .  sitaGLIPtin-metformin (JANUMET) 50-1000 MG tablet Take 1 tablet by mouth daily.  . traMADol (ULTRAM) 50 MG tablet Take 1 tablet (50 mg total) by mouth every 12 (twelve) hours as needed for severe pain.  Marland Kitchen triamcinolone cream (KENALOG) 0.1 % Apply 1 application topically daily as needed for irritation.     Allergies:   Lisinopril and Plavix [clopidogrel bisulfate]   Social History   Socioeconomic History  . Marital status: Married    Spouse name: Not on file  . Number of children: Not on file  . Years of education: 12,college  . Highest education level: Not on file  Occupational History  . Not on file  Social Needs  . Financial resource strain: Not on file  . Food insecurity:    Worry: Not on file    Inability: Not on file  . Transportation needs:    Medical: Not on file    Non-medical: Not on file  Tobacco Use  . Smoking status: Never Smoker  . Smokeless tobacco: Never Used  Substance and Sexual Activity  . Alcohol use: No    Alcohol/week: 0.0 oz    Comment: ocassionally  . Drug use: No  . Sexual activity: Yes  Lifestyle  . Physical activity:    Days per week: Not on file    Minutes per session: Not on file  . Stress: Not on file  Relationships  . Social connections:    Talks on phone: Not on file    Gets together: Not on file    Attends religious service: Not on file    Active member of club or organization: Not on file    Attends meetings of clubs or organizations: Not on file    Relationship status: Not on file  Other Topics Concern  . Not on file  Social History Narrative  . Not on file     Family History: The patient's family history includes Diabetes in her mother.  ROS:   Please see the history of present illness.     All other systems reviewed and are negative.  EKGs/Labs/Other Studies Reviewed:    The following studies were reviewed today:  06/02/17 Echo  Study Conclusions - Left ventricle: The cavity size was normal. Wall thickness  was increased in a pattern of severe LVH. Systolic function was severely reduced. The estimated ejection fraction was in the range of 25% to 30%. Diffuse hypokinesis. Doppler parameters are consistent with abnormal left ventricular relaxation (grade 1 diastolic dysfunction). The E/e&' ratio is >15, suggesting elevated LV filling pressure. Ejection fraction (MOD, 2-plane): 29%. - Aortic valve: Trileaflet; mildly calcified leaflets. There was no stenosis. There was trivial regurgitation. - Mitral valve: Calcified annulus. Mildly thickened leaflets . There was trivial regurgitation. - Left atrium: The atrium was mildly dilated. -  Right ventricle: The cavity size was mildly dilated. Mildly reduced systolic function. - Inferior vena cava: The vessel was normal in size. The respirophasic diameter changes were in the normal range (>= 50%), consistent with normal central venous pressure. - Pericardium, extracardiac: A trivial pericardial effusion was identified.  Impressions: - LVEF 25-30%, severe LVH, global hypokinesis, grade 1 DD, elevated LV filling pressure, mildly dilated RV with reduced systolic function, trivial pericardial effusion.   Nuc study 10/2016  Study Highlights     Nuclear stress EF: 57%.  There was no ST segment deviation noted during stress.  Defect 1: There is a medium defect of mild severity present in the basal inferolateral, mid inferolateral and apical lateral location.  This is a low risk study.  Findings consistent with ischemia.  Low risk stress nuclear study with a moderate area of ischemia in the distribution of a posterolateral ventricular branch of the RCA or LCx. Normal left ventricular regional and global systolic function.      Echo 11/24/16 Study Conclusions - Left ventricle: The cavity size was normal. Systolic function was normal. Wall motion was normal; there were no regional wall motion  abnormalities. Features are consistent with a pseudonormal left ventricular filling pattern, with concomitant abnormal relaxation and increased filling pressure (grade 2 diastolic dysfunction). Doppler parameters are consistent with high ventricular filling pressure. - Aortic valve: Moderate focal calcification involving the noncoronary cusp. There was mild regurgitation. Valve area (VTI): 2.34 cm^2. Valve area (Vmax): 1.61 cm^2. Valve area (Vmean): 1.91 cm^2. Regurgitation pressure half-time: 442 ms. - Mitral valve: Severely calcified annulus. Mildly thickened leaflets . There was mild regurgitation. - Left atrium: The atrium was mildly dilated. - Tricuspid valve: There was mild regurgitation. - Pulmonic valve: There was trivial regurgitation. - Pulmonary arteries: PA peak pressure: 39 mm Hg (S).  Impressions: - Compared to prior echo, LVF has normalized and PASP has decreased from 7mHg to 371mg. The right ventricular systolic pressure was increased consistent with mild pulmonary hypertension.    EKG:  EKG is not ordered today.   Recent Labs: 06/01/2017: B Natriuretic Peptide 436.4 06/05/2017: Hemoglobin 10.5; Platelets 181 06/29/2017: BUN 21; Creatinine, Ser 1.39; Potassium 4.1; Sodium 144  Recent Lipid Panel    Component Value Date/Time   CHOL 168 11/08/2012 1331   TRIG 95 11/08/2012 1331   HDL 45 11/08/2012 1331   CHOLHDL 3.7 11/08/2012 1331   VLDL 19 11/08/2012 1331   LDLCALC 104 (H) 11/08/2012 1331    Physical Exam:    VS:  BP 140/82   Pulse 67   Ht _0  (1.6 m)   Wt 169 lb (76.7 kg)   SpO2 96%   BMI 29.94 kg/m     Wt Readings from Last 3 Encounters:  08/31/17 169 lb (76.7 kg)  08/11/17 171 lb (77.6 kg)  06/29/17 172 lb (78 kg)     GEN:  Well nourished, well developed in no acute distress HEENT: Normal NECK: No JVD; No carotid bruits LYMPHATICS: No lymphadenopathy CARDIAC: RRR, no murmurs, rubs, gallops RESPIRATORY:  Clear to  auscultation without rales, wheezing or rhonchi  ABDOMEN: Soft, non-tender, non-distended MUSCULOSKELETAL:  No edema; No deformity  SKIN: Warm and dry NEUROLOGIC:  Alert and oriented x 3 PSYCHIATRIC:  Normal affect   ASSESSMENT:    1. Chronic combined systolic and diastolic CHF, NYHA class 2 ([facility]  2. Nonischemic cardiomyopathy ([facility]  3. Essential hypertension   4. DM (diabetes mellitus) type II uncontrolled, periph vascular disorder ([facility]  5. CKD (chronic kidney disease), stage III ([place])   6. Hyperlipidemia, unspecified hyperlipidemia type    PLAN:    In order of problems listed above:  Chronic combined systolic and diastolic CHF, NYHA class 2 ([place]) - Plan: Basic Metabolic Panel (BMET), ECHOCARDIOGRAM COMPLETE Nonischemic cardiomyopathy ([place]) - Plan: ECHOCARDIOGRAM COMPLETE She is doing quite well on her current regimen of ASA, lipitor 40 mg, norvasc, coreg, and losartan. She is euvolemic and not requiring a diuretic regimen. It has been three months since her last echo that showed a reduced EF. We will repeat and echo before attempting to titrate medications further. If LVEF still reduced, may consider transitioning losartan to entresto. Will need to check patient assistance for this.    Essential hypertension - Plan: Basic Metabolic Panel (BMET), Lipid Profile, Hepatic function panel Pressurs well-controlled at home. She generally runs 120/70. No medication changes at this time.   DM (diabetes mellitus) type II uncontrolled, periph vascular disorder (H[facility]) Followed by PCP   CKD (chronic kidney disease), stage III ([place]) Will check a BMP today with losartan on board.    Hyperlipidemia Last lipid panel on file is from 2014. Unclear if she has cholesterol checks with PCP. Will check a fasting lipid profile and liver function tests today. On lipitor 40 mg. LDL goal is less than 100.     Medication Adjustments/Labs and Tests Ordered: Current medicines are reviewed at length  with the patient today.  Concerns regarding medicines are outlined above.  Orders Placed This Encounter  Procedures  . Basic Metabolic Panel (BMET)  . Lipid Profile  . Hepatic function panel  . ECHOCARDIOGRAM COMPLETE   No orders of the defined types were placed in this encounter.   Signed, Ledora Bottcher, [place]  08/31/2017 9:32 AM     [facility]

## 2017-08-30 ENCOUNTER — Encounter: Payer: Self-pay | Admitting: *Deleted

## 2017-08-31 ENCOUNTER — Encounter: Payer: Self-pay | Admitting: Cardiology

## 2017-08-31 ENCOUNTER — Ambulatory Visit (INDEPENDENT_AMBULATORY_CARE_PROVIDER_SITE_OTHER): Payer: Self-pay | Admitting: Physician Assistant

## 2017-08-31 VITALS — BP 140/82 | HR 67 | Ht 63.0 in | Wt 169.0 lb

## 2017-08-31 DIAGNOSIS — IMO0002 Reserved for concepts with insufficient information to code with codable children: Secondary | ICD-10-CM

## 2017-08-31 DIAGNOSIS — N183 Chronic kidney disease, stage 3 unspecified: Secondary | ICD-10-CM

## 2017-08-31 DIAGNOSIS — I428 Other cardiomyopathies: Secondary | ICD-10-CM | POA: Insufficient documentation

## 2017-08-31 DIAGNOSIS — E1165 Type 2 diabetes mellitus with hyperglycemia: Secondary | ICD-10-CM

## 2017-08-31 DIAGNOSIS — I5042 Chronic combined systolic (congestive) and diastolic (congestive) heart failure: Secondary | ICD-10-CM

## 2017-08-31 DIAGNOSIS — E1151 Type 2 diabetes mellitus with diabetic peripheral angiopathy without gangrene: Secondary | ICD-10-CM

## 2017-08-31 DIAGNOSIS — E785 Hyperlipidemia, unspecified: Secondary | ICD-10-CM

## 2017-08-31 DIAGNOSIS — I10 Essential (primary) hypertension: Secondary | ICD-10-CM

## 2017-08-31 LAB — BASIC METABOLIC PANEL
BUN / CREAT RATIO: 18 (ref 12–28)
BUN: 26 mg/dL (ref 8–27)
CO2: 23 mmol/L (ref 20–29)
Calcium: 9.9 mg/dL (ref 8.7–10.3)
Chloride: 105 mmol/L (ref 96–106)
Creatinine, Ser: 1.48 mg/dL — ABNORMAL HIGH (ref 0.57–1.00)
GFR, EST AFRICAN AMERICAN: 44 mL/min/{1.73_m2} — AB (ref >59)
GFR, EST NON AFRICAN AMERICAN: 38 mL/min/{1.73_m2} — AB (ref >59)
GLUCOSE: 101 mg/dL — AB (ref 65–99)
Potassium: 4.5 mmol/L (ref 3.5–5.2)
SODIUM: 141 mmol/L (ref 134–144)

## 2017-08-31 LAB — HEPATIC FUNCTION PANEL
ALT: 14 IU/L (ref 0–32)
AST: 17 IU/L (ref 0–40)
Albumin: 4.4 g/dL (ref 3.6–4.8)
Alkaline Phosphatase: 120 IU/L — ABNORMAL HIGH (ref 39–117)
BILIRUBIN TOTAL: 0.3 mg/dL (ref 0.0–1.2)
Bilirubin, Direct: 0.11 mg/dL (ref 0.00–0.40)
TOTAL PROTEIN: 7.5 g/dL (ref 6.0–8.5)

## 2017-08-31 LAB — LIPID PANEL
CHOL/HDL RATIO: 2.5 ratio (ref 0.0–4.4)
Cholesterol, Total: 149 mg/dL (ref 100–199)
HDL: 60 mg/dL (ref >39)
LDL CALC: 74 mg/dL (ref 0–99)
Triglycerides: 74 mg/dL (ref 0–149)
VLDL CHOLESTEROL CAL: 15 mg/dL (ref 5–40)

## 2017-08-31 NOTE — Patient Instructions (Signed)
Medication Instructions:  Your physician recommends that you continue on your current medications as directed. Please refer to the Current Medication list given to you today.   Labwork: TODAY BMET, LIPID, LFT  Testing/Procedures: Your physician has requested that you have an echocardiogram. Echocardiography is a painless test that uses sound waves to create images of your heart. It provides your doctor with information about the size and shape of your heart and how well your heart's chambers and valves are working. This procedure takes approximately one hour. There are no restrictions for this procedure.    Follow-Up: 3 MONTHS WITH DR. Tamala Julian   Any Other Special Instructions Will Be Listed Below (If Applicable).     If you need a refill on your cardiac medications before your next appointment, please call your pharmacy.

## 2017-09-05 ENCOUNTER — Other Ambulatory Visit (HOSPITAL_COMMUNITY): Payer: Self-pay

## 2017-09-12 ENCOUNTER — Other Ambulatory Visit: Payer: Self-pay

## 2017-09-12 ENCOUNTER — Ambulatory Visit (HOSPITAL_COMMUNITY): Payer: No Typology Code available for payment source | Attending: Cardiology

## 2017-09-12 DIAGNOSIS — E1122 Type 2 diabetes mellitus with diabetic chronic kidney disease: Secondary | ICD-10-CM | POA: Insufficient documentation

## 2017-09-12 DIAGNOSIS — N189 Chronic kidney disease, unspecified: Secondary | ICD-10-CM | POA: Insufficient documentation

## 2017-09-12 DIAGNOSIS — E785 Hyperlipidemia, unspecified: Secondary | ICD-10-CM | POA: Insufficient documentation

## 2017-09-12 DIAGNOSIS — I130 Hypertensive heart and chronic kidney disease with heart failure and stage 1 through stage 4 chronic kidney disease, or unspecified chronic kidney disease: Secondary | ICD-10-CM | POA: Insufficient documentation

## 2017-09-12 DIAGNOSIS — I428 Other cardiomyopathies: Secondary | ICD-10-CM

## 2017-09-12 DIAGNOSIS — I5042 Chronic combined systolic (congestive) and diastolic (congestive) heart failure: Secondary | ICD-10-CM | POA: Insufficient documentation

## 2017-09-12 MED FILL — LOSARTAN POTASSIUM 100 MG T: 100 | 30 days supply | Qty: 30 | Fill #0

## 2017-10-02 ENCOUNTER — Ambulatory Visit: Payer: Self-pay | Attending: Family Medicine | Admitting: Podiatry

## 2017-10-02 DIAGNOSIS — M79674 Pain in right toe(s): Secondary | ICD-10-CM

## 2017-10-02 DIAGNOSIS — E1151 Type 2 diabetes mellitus with diabetic peripheral angiopathy without gangrene: Secondary | ICD-10-CM

## 2017-10-02 DIAGNOSIS — M79675 Pain in left toe(s): Secondary | ICD-10-CM

## 2017-10-02 DIAGNOSIS — IMO0002 Reserved for concepts with insufficient information to code with codable children: Secondary | ICD-10-CM

## 2017-10-02 DIAGNOSIS — E1165 Type 2 diabetes mellitus with hyperglycemia: Secondary | ICD-10-CM

## 2017-10-02 DIAGNOSIS — B351 Tinea unguium: Secondary | ICD-10-CM

## 2017-10-04 NOTE — Progress Notes (Signed)
Subjective: 62 [facility]. [facility] painful, elongated, thickened toenails which they cannot trim themself. Denies any redness or drainage around the nails. Denies any acute changes since last appointment and no new complaints today. Denies any systemic complaints such as fevers, chills, nausea, vomiting.   PCP: Melanee Spry, MD   Objective: AAO 3, NAD Neurovascular status unchanged Nails hypertrophic, dystrophic, elongated, brittle, discolored 10. There is tenderness overlying the nails 1-5 bilaterally. There is no surrounding erythema or drainage along the nail sites. No open lesions or pre-ulcerative lesions are identified. No other areas of tenderness bilateral lower extremities. No overlying edema, erythema, increased warmth. No pain with calf compression, swelling, warmth, erythema.  Assessment: Patient presents with symptomatic onychomycosis  Plan: -Treatment options including alternatives, risks, complications were discussed -Nails sharply debrided 10 without complication/bleeding. -Discussed daily foot inspection. If there are any changes, to call the office immediately.  -Follow-up in 3 months or sooner if any problems are to arise. In the meantime, encouraged to call the office with any questions, concerns, changes symptoms.  Celesta Gentile, DPM

## 2017-10-05 MED FILL — AMLODIPINE BESYLATE 10 MG T: 10 | 30 days supply | Qty: 30 | Fill #2

## 2017-10-05 MED FILL — ATORVASTATIN 40 MG TABLET: 40 | 30 days supply | Qty: 30 | Fill #3

## 2017-10-05 MED FILL — JANUMET 50-1,000 MG TABLET: 50-1000 | 30 days supply | Qty: 60 | Fill #9

## 2017-10-05 MED FILL — CARVEDILOL 12.5 MG TABLET: 12.5 | 30 days supply | Qty: 60 | Fill #6

## 2017-10-11 ENCOUNTER — Encounter: Payer: Self-pay | Admitting: Internal Medicine

## 2017-10-12 NOTE — Assessment & Plan Note (Signed)
Had a repeat echocardiogram in May 2019 which demonstrated return of normal systolic function with ejection fraction  of 60% to 65% (prior ECHO w/ EF 25-30%), and grade 2 diastolic dysfunction.  Current medication regimen includes carvedilol 12.5 mg twice daily and losartan 100 mg daily.  Patient is also on daily aspirin 81 mg and Lipitor 40 mg.  Patient appears euvolemic on exam today.  Denies chest pain or shortness of breath.  Plan: -Continue carvedilol 12.5 mg twice daily -Continue losartan 100 mg daily -Continue aspirin and statin

## 2017-10-12 NOTE — Assessment & Plan Note (Addendum)
A1c 7.4 today, POC glucose 392.  Hemoglobin A1c 7.0 (hemoglobin A1C).  Current regimen includes Janumet BID and Levemir 20 units at bedtime. Patient states she ran out of her insulin needles about 1 week ago and was unable to afford the refill so she has not been able to take her Levemir for 1 week duration. The patient states she has not been following a strict diet. Once her A1C was well controlled she did not think she had to continue with such strict diet restrictions. Patient checks her blood glucose about once daily.  Glucometer strips are expensive and she has difficulty affording them. BG log today with postprandial morning blood glucose levels, which are all within goal.  In August 2018 she was previously well controlled with an A1c of 6.5.   I believe her diet is contributing to the increase in her A1c.  She states she eats a lot of rice, sodas, and sweet tea.  Previously when her A1c was better controlled she reports drinking a lot of water and not drinking as much sweet tea and soda.  I think she would benefit from a nutrition meeting with Butch Penny to get her back to goal. Elevated POC glucose likely to not being on Levemir for 1 week duration.   Plan: -Continue Janumet BID -Continue Levemir 20 units nightly -Discussed with patient that she can continue to check her blood glucose once daily for affordability, but she should change the time she is checking.  -Recommended she check postprandial BG after breakfast, lunch, and dinner on alternating days.  She is amenable to this. -Referral placed for nutrition consult -Patient's hemoglobin A1c continues to rise at 39-month follow-up she will need medication adjustments and possible addition of a third medication to her regimen; patient is agreeable to this plan and understands  -RTC in 3 months

## 2017-10-12 NOTE — Assessment & Plan Note (Addendum)
Patient's FOBT was negative.  Repeat stool card testing in 1 year.  Mammogram has not been completed yet.

## 2017-10-12 NOTE — Progress Notes (Signed)
   CC: hypertension, CHF, Type 2 DM  HPI:  [name] is a 62 y.o. female with a past medical history listed below here today for follow up of her hypertension, type 2 diabetes, and congestive heart failure.  For details of today's visit and the status of her chronic medical issues please refer to the assessment and plan.  Past Medical History:  Diagnosis Date  . Arthritis   . CHF (congestive heart failure) ([place])    2014...@ [facility]  . Diabetes mellitus    diagnosed 2001  . Hypertension   . PVD (peripheral vascular disease) ([place])    Review of Systems:   Review of Systems  Constitutional: Negative for chills and fever.  Respiratory: Negative for shortness of breath.   Cardiovascular: Negative for chest pain, orthopnea and leg swelling.  Gastrointestinal: Negative for nausea and vomiting.    Physical Exam:  Vitals:   10/13/17 1325  BP: 123/63  Pulse: 87  Temp: 98.9 F (37.2 C)  TempSrc: Oral  SpO2: 100%  Weight: 171 lb 12.8 oz (77.9 kg)  Height: 5\' 3"  (1.6 m)   General: Laying in bed comfortably, NAD HEENT: [place]/AT, EOMI, no scleral icterus, PERRL Cardiac: RRR, No R/M/G appreciated Pulm: normal effort, CTAB, no rales Abd: soft, non tender, non distended, BS normal Ext: extremities well perfused, no peripheral edema Neuro: alert and oriented X3, cranial nerves II-XII grossly intact   Assessment & Plan:   See Encounters Tab for problem based charting.  Patient discussed with Dr. Daryll Drown

## 2017-10-13 ENCOUNTER — Other Ambulatory Visit: Payer: Self-pay

## 2017-10-13 ENCOUNTER — Encounter: Payer: Self-pay | Admitting: Internal Medicine

## 2017-10-13 ENCOUNTER — Ambulatory Visit (INDEPENDENT_AMBULATORY_CARE_PROVIDER_SITE_OTHER): Payer: No Typology Code available for payment source | Admitting: Internal Medicine

## 2017-10-13 ENCOUNTER — Encounter (INDEPENDENT_AMBULATORY_CARE_PROVIDER_SITE_OTHER): Payer: Self-pay

## 2017-10-13 VITALS — BP 123/63 | HR 87 | Temp 98.9°F | Ht 63.0 in | Wt 171.8 lb

## 2017-10-13 DIAGNOSIS — I10 Essential (primary) hypertension: Secondary | ICD-10-CM

## 2017-10-13 DIAGNOSIS — E1165 Type 2 diabetes mellitus with hyperglycemia: Secondary | ICD-10-CM

## 2017-10-13 DIAGNOSIS — M1611 Unilateral primary osteoarthritis, right hip: Secondary | ICD-10-CM

## 2017-10-13 DIAGNOSIS — IMO0002 Reserved for concepts with insufficient information to code with codable children: Secondary | ICD-10-CM

## 2017-10-13 DIAGNOSIS — Z0000 Encounter for general adult medical examination without abnormal findings: Secondary | ICD-10-CM

## 2017-10-13 DIAGNOSIS — R37 Sexual dysfunction, unspecified: Secondary | ICD-10-CM

## 2017-10-13 DIAGNOSIS — F66 Other sexual disorders: Secondary | ICD-10-CM

## 2017-10-13 DIAGNOSIS — E1151 Type 2 diabetes mellitus with diabetic peripheral angiopathy without gangrene: Secondary | ICD-10-CM

## 2017-10-13 DIAGNOSIS — I5042 Chronic combined systolic (congestive) and diastolic (congestive) heart failure: Secondary | ICD-10-CM

## 2017-10-13 HISTORY — DX: Other sexual disorders: F66

## 2017-10-13 LAB — POCT GLYCOSYLATED HEMOGLOBIN (HGB A1C): Hemoglobin A1C: 7.4 % — AB (ref 4.0–5.6)

## 2017-10-13 LAB — GLUCOSE, CAPILLARY: Glucose-Capillary: 392 mg/dL — ABNORMAL HIGH (ref 65–99)

## 2017-10-13 MED ORDER — LOSARTAN POTASSIUM 100 MG PO TABS: 100.0000 mg | ORAL_TABLET | Freq: Every day | ORAL | 3 refills | Status: DC

## 2017-10-13 MED ORDER — TRAMADOL HCL 50 MG PO TABS: 50.0000 mg | ORAL_TABLET | Freq: Two times a day (BID) | ORAL | 0 refills | Status: DC | PRN

## 2017-10-13 MED ORDER — SITAGLIPTIN PHOS-METFORMIN HCL 50-1000 MG PO TABS: 1.0000 | ORAL_TABLET | Freq: Two times a day (BID) | ORAL | 6 refills | Status: DC

## 2017-10-13 NOTE — Assessment & Plan Note (Signed)
BP Readings from Last 3 Encounters:  10/13/17 123/63  08/31/17 140/82  08/11/17 (!) 124/55   Blood pressure currently well controlled on Losartan 100 mg daily, Carvedilol 12.5 mg daily, and amlodipine 10 mg.   Plan: Continue current treatment regimen: -Losartan 100 mg daily -Carvedilol 12.5 mg daily -Amlodipine 10 mg daily

## 2017-10-13 NOTE — Assessment & Plan Note (Signed)
At last visit, I prescribed the patient tramadol 50 mg to take every 12 hours as needed for pain. She states the Tramadol dose help and makes her hip pain tolerable. She typically takes 1 pill at night. She is able to perform her ADLs with this dosage and the medication is not overly sedating.   Plan: -Plan refill Tramadol 50 mg BID PRN, #60 tablets per 30 days with no refills

## 2017-10-13 NOTE — Assessment & Plan Note (Signed)
Patient states she has been having intimacy difficulties with her husband.  States this is been ongoing for about 10 years, but has lately been very frustrating for her.  She denies vaginal dryness, dyspareunia, or low libido.  She states that she has not had a orgasm in 10 years and this is due to her husband's premature ejaculation.  She states she has tried discussing this with her husband, but he does not listen to her and is unwilling to talk about it.  States her husband was previously prescribed Viagra.  I explained to the patient that Viagra is for erectile dysfunction and not for premature ejaculation. She is concerned because she does not want to go outside her marriage to find intimacy.  Plan: -Provided the patient with information for family and couples counseling

## 2017-10-13 NOTE — Patient Instructions (Signed)
[name],   It was a pleasure seeing you today.  We will continue your current diabetes medication regimen.  I will refer you to [facility] Penny for nutrition counseling.  In 3 months if your A1c continues to be elevated we will have to add an additional medicine.   Check your blood sugars at different times.  If one day you check your blood sugar after breakfast, the next day check after lunch or dinner.  Your blood pressure looks great.  I have made no changes in your blood pressure medication regimen.  Return to clinic in 3 months.

## 2017-10-13 NOTE — Addendum Note (Signed)
Addended by: Melanee Spry on: 10/13/2017 03:15 PM   Modules accepted: Orders

## 2017-10-16 NOTE — Progress Notes (Signed)
Internal Medicine Clinic Attending  Case discussed with Dr. LaCroce at the time of the visit.  We reviewed the resident's history and exam and pertinent patient test results.  I agree with the assessment, diagnosis, and plan of care documented in the resident's note.  

## 2017-10-30 MED FILL — traMADol HCL 50 MG TABS: 50 | 30 days supply | Qty: 60 | Fill #0

## 2017-11-01 MED FILL — LOSARTAN POTASSIUM 100 MG T: 100 | 30 days supply | Qty: 30 | Fill #0

## 2017-11-10 ENCOUNTER — Encounter: Payer: Self-pay | Admitting: Internal Medicine

## 2017-11-10 ENCOUNTER — Other Ambulatory Visit: Payer: Self-pay | Admitting: Dietician

## 2017-11-10 ENCOUNTER — Encounter: Payer: No Typology Code available for payment source | Admitting: Dietician

## 2017-11-10 ENCOUNTER — Telehealth: Payer: Self-pay | Admitting: Dietician

## 2017-11-10 ENCOUNTER — Ambulatory Visit: Payer: No Typology Code available for payment source

## 2017-11-10 DIAGNOSIS — E1151 Type 2 diabetes mellitus with diabetic peripheral angiopathy without gangrene: Secondary | ICD-10-CM

## 2017-11-10 DIAGNOSIS — IMO0002 Reserved for concepts with insufficient information to code with codable children: Secondary | ICD-10-CM

## 2017-11-10 DIAGNOSIS — E1165 Type 2 diabetes mellitus with hyperglycemia: Principal | ICD-10-CM

## 2017-11-10 MED ORDER — "INSULIN SYRINGE-NEEDLE U-100 31G X 15/64"" 0.3 ML MISC": 12 refills | Status: DC

## 2017-11-10 MED ORDER — INSULIN SYRINGE-NEEDLE U-100 31G X 15/64" 0.3 ML MISC
12 refills | Status: DC
Start: 2017-11-10 — End: 2018-02-28

## 2017-11-10 MED FILL — ULTICARE SYR 0.3 ML 30GX5/1: 30G X 5/16" | 30 days supply | Qty: 30 | Fill #0

## 2017-11-10 NOTE — Telephone Encounter (Signed)
Left a message for patient to reschedule her appointment when she reschedule her financial counseling appointment. Also that syringe prescription sent to her pharmacy.

## 2017-11-10 NOTE — Telephone Encounter (Signed)
Patient needs syringes prescription at [facility]

## 2017-11-15 MED FILL — LEVEMIR 100 UNITS/ML VIAL: 100 | 28 days supply | Qty: 10 | Fill #3

## 2017-11-15 NOTE — Addendum Note (Signed)
Addended by: Hulan Fray on: 11/15/2017 03:53 PM   Modules accepted: Orders

## 2017-11-20 ENCOUNTER — Encounter: Payer: Self-pay | Admitting: *Deleted

## 2017-11-22 ENCOUNTER — Other Ambulatory Visit: Payer: Self-pay | Admitting: Internal Medicine

## 2017-11-22 DIAGNOSIS — E1165 Type 2 diabetes mellitus with hyperglycemia: Secondary | ICD-10-CM

## 2017-11-22 DIAGNOSIS — IMO0002 Reserved for concepts with insufficient information to code with codable children: Secondary | ICD-10-CM

## 2017-11-22 DIAGNOSIS — I10 Essential (primary) hypertension: Secondary | ICD-10-CM

## 2017-11-22 DIAGNOSIS — E1151 Type 2 diabetes mellitus with diabetic peripheral angiopathy without gangrene: Secondary | ICD-10-CM

## 2017-11-23 ENCOUNTER — Ambulatory Visit: Payer: No Typology Code available for payment source

## 2017-12-22 ENCOUNTER — Ambulatory Visit: Payer: No Typology Code available for payment source

## 2017-12-26 ENCOUNTER — Encounter: Payer: Self-pay | Admitting: Interventional Cardiology

## 2018-01-16 ENCOUNTER — Ambulatory Visit: Payer: Self-pay | Admitting: Interventional Cardiology

## 2018-01-30 ENCOUNTER — Encounter: Payer: Self-pay | Admitting: Internal Medicine

## 2018-01-30 ENCOUNTER — Ambulatory Visit: Payer: No Typology Code available for payment source

## 2018-02-12 ENCOUNTER — Ambulatory Visit: Payer: No Typology Code available for payment source

## 2018-02-27 ENCOUNTER — Ambulatory Visit: Payer: No Typology Code available for payment source

## 2018-02-28 ENCOUNTER — Encounter: Payer: Self-pay | Admitting: Internal Medicine

## 2018-02-28 ENCOUNTER — Ambulatory Visit (INDEPENDENT_AMBULATORY_CARE_PROVIDER_SITE_OTHER): Payer: Self-pay | Admitting: Internal Medicine

## 2018-02-28 VITALS — BP 172/82 | HR 85 | Temp 98.7°F | Wt 165.5 lb

## 2018-02-28 DIAGNOSIS — Z23 Encounter for immunization: Secondary | ICD-10-CM

## 2018-02-28 DIAGNOSIS — IMO0002 Reserved for concepts with insufficient information to code with codable children: Secondary | ICD-10-CM

## 2018-02-28 DIAGNOSIS — E1151 Type 2 diabetes mellitus with diabetic peripheral angiopathy without gangrene: Secondary | ICD-10-CM

## 2018-02-28 DIAGNOSIS — I5042 Chronic combined systolic (congestive) and diastolic (congestive) heart failure: Secondary | ICD-10-CM

## 2018-02-28 DIAGNOSIS — I110 Hypertensive heart disease with heart failure: Secondary | ICD-10-CM

## 2018-02-28 DIAGNOSIS — I5040 Unspecified combined systolic (congestive) and diastolic (congestive) heart failure: Secondary | ICD-10-CM

## 2018-02-28 DIAGNOSIS — I10 Essential (primary) hypertension: Secondary | ICD-10-CM

## 2018-02-28 DIAGNOSIS — Z79899 Other long term (current) drug therapy: Secondary | ICD-10-CM

## 2018-02-28 DIAGNOSIS — Z794 Long term (current) use of insulin: Secondary | ICD-10-CM

## 2018-02-28 DIAGNOSIS — E1165 Type 2 diabetes mellitus with hyperglycemia: Secondary | ICD-10-CM

## 2018-02-28 LAB — POCT GLYCOSYLATED HEMOGLOBIN (HGB A1C): HbA1c POC (<> result, manual entry): >14 % — AB (ref 4.0–5.6)

## 2018-02-28 LAB — GLUCOSE, CAPILLARY: GLUCOSE-CAPILLARY: 386 mg/dL — AB (ref 70–99)

## 2018-02-28 MED ORDER — CARVEDILOL 12.5 MG PO TABS: ORAL_TABLET | ORAL | 0 refills | Status: DC

## 2018-02-28 MED ORDER — INSULIN GLARGINE 100 UNIT/ML SOLOSTAR PEN: 20.0000 [IU] | PEN_INJECTOR | Freq: Every day | SUBCUTANEOUS | 10 refills | Status: DC

## 2018-02-28 MED ORDER — SITAGLIPTIN PHOS-METFORMIN HCL 50-1000 MG PO TABS: 1.0000 | ORAL_TABLET | Freq: Two times a day (BID) | ORAL | 6 refills | Status: DC

## 2018-02-28 MED ORDER — ATORVASTATIN CALCIUM 40 MG PO TABS: 40.0000 mg | ORAL_TABLET | Freq: Every day | ORAL | 0 refills | Status: DC

## 2018-02-28 MED ORDER — LOSARTAN POTASSIUM 100 MG PO TABS: 100.0000 mg | ORAL_TABLET | Freq: Every day | ORAL | 3 refills | Status: DC

## 2018-02-28 MED ORDER — BLOOD GLUCOSE MONITOR KIT: PACK | 0 refills | Status: DC

## 2018-02-28 MED ORDER — PEN NEEDLES 31G X 5 MM MISC: 20.0000 [IU] | Freq: Every day | 3 refills | Status: DC

## 2018-02-28 MED ORDER — INSULIN DETEMIR 100 UNIT/ML FLEXPEN: 20.0000 [IU] | PEN_INJECTOR | Freq: Every day | SUBCUTANEOUS | 11 refills | Status: DC

## 2018-02-28 MED FILL — LANTUS SOLOSTAR 100 UNITS/M: 100 | 15 days supply | Qty: 3 | Fill #0

## 2018-02-28 MED FILL — JANUMET 50-1,000 MG TABLET: 50-1000 | 30 days supply | Qty: 60 | Fill #0

## 2018-02-28 MED FILL — UNIFINE PENTIPS 31GX3/16: 31G X 5 MM | 100 days supply | Qty: 100 | Fill #0

## 2018-02-28 MED FILL — LOSARTAN POTASSIUM 100 MG T: 100 | 30 days supply | Qty: 30 | Fill #0

## 2018-02-28 MED FILL — ATORVASTATIN 40 MG TABLET: 40 | 30 days supply | Qty: 30 | Fill #0

## 2018-02-28 MED FILL — CARVEDILOL 12.5 MG TABLET: 12.5 | 30 days supply | Qty: 60 | Fill #0

## 2018-02-28 MED FILL — UNIFINE PENTIPS 31GX3/16": 31G X 5 MM | 100 days supply | Qty: 100 | Fill #0

## 2018-02-28 NOTE — Patient Instructions (Signed)
FOLLOW-UP INSTRUCTIONS When: In two weeks for a blood pressure check and a basic lab. Again in ~1-2 months for a routine visit with Dr. Sharon Seller What to bring: All of your medications and your glucose meter  I have made resumed several of your medications today. Please take the insulin nightly. Take the other medications as prescribed. If you have any questions or concerns please call.  As always if your symptoms worsen, fail to improve, or you develop other concerning symptoms, please notify our office or visit the local ER if we are unavailable. Symptoms including confusion, weakness, headache, should not be ignored and should encourage you to visit the ED if we are unavailable by phone or the symptoms are severe.  Thank you for your visit to the [facility] today. If you have any questions or concerns please call us at [phone].

## 2018-02-28 NOTE — Assessment & Plan Note (Signed)
HTN: Severly hypertensive today. Given that this has persisted x4 months we will cautiously resume treatment. I will hold her Amlodipine until she return in two weeks for a BP check and BMP.  Plan: Continue Losartan and Carvedilol  Hold restarting Amlodipine and have patient return in two weeks for a BP check. Return in two weeks for BMP and BP check

## 2018-02-28 NOTE — Assessment & Plan Note (Signed)
Combined systolic and diastolic heart failure: Off of her medications now for four months. Denied orthopnea, edema, or fatigue. Will continue her BB and ACEi.   Plan:  Carvedilol 12.5 mg BID Losartan 100mg  daily

## 2018-02-28 NOTE — Progress Notes (Signed)
   CC: diabetes  HPI:[name] is a 62 y.o. female who presents for evaluation of diabetes. Please see individual problem based A/P for details.  Diabetes: Ran out of her medication four months prior. A1c today >14, double her last A1c. She now has her [employer] card but is otherwise uninsured. She is in agreement with continuing her prior medications as she notes increased urination, mildly increased thirst, and slowed mental function. She otherwise appears well today. She denied headache, weakness, diaphoresis, or chest pain.  Plan: Resume Levemir 20U nightly Resume Janumet 50-1000 mg BID Return in two week for glucometer download and further treatment Given script for glucometer to [facility]  Combined systolic and diastolic heart failure: Off of her medications now for four months. Denied orthopnea, edema, or fatigue. Will continue her BB and ACEi.   Plan:  Carvedilol 12.5 mg BID Losartan 100mg  daily  HTN: Severly hypertensive today. Given that this has persisted x4 months we will cautiously resume treatment. I will hold her Amlodipine until she return in two weeks for a BP check and BMP.  Plan: Continue Losartan and Carvedilol  Hold restarting Amlodipine and have patient return in two weeks for a BP check. Return in two weeks for BMP and BP check   Past Medical History:  Diagnosis Date  . Arthritis   . CHF (congestive heart failure) ([place])    2014...@ [facility]  . Diabetes mellitus    diagnosed 2001  . Healthcare maintenance 06/16/2015  . Hypertension   . Influenza A 06/01/2017  . Influenza, pneumonia 06/01/2017  . Left flank pain 10/11/2016  . Problem with sexual relationship 10/13/2017  . PVD (peripheral vascular disease) ([place])    Review of Systems:  ROS negative except as per HPI.  Physical Exam: Vitals:   02/28/18 0924  BP: (!) 172/82  Pulse: 85  Temp: 98.7 F (37.1 C)  TempSrc: Oral  Weight: 165 lb 8 oz (75.1 kg)   General: A/O x4, in on acute distress, afebrile,  nondiaphoretic Cardio: RRR, no mrg's Pulmonary: CTA bilaterally, no wheezing or crackles MSK: BLE nontender, nonedematous  Assessment & Plan:   See Encounters Tab for problem based charting.  Patient discussed with Dr. Lynnae January

## 2018-02-28 NOTE — Assessment & Plan Note (Signed)
Diabetes: Ran out of her medication four months prior. A1c today >14, double her last A1c. She now has her [employer] card but is otherwise uninsured. She is in agreement with continuing her prior medications as she notes increased urination, mildly increased thirst, and slowed mental function. She otherwise appears well today. She denied headache, weakness, diaphoresis, or chest pain.  Plan: Resume Levemir 20U nightly Resume Janumet 50-1000 mg BID Return in two week for glucometer download and further treatment Given script for glucometer to [facility]

## 2018-03-06 ENCOUNTER — Telehealth: Payer: Self-pay | Admitting: Dietician

## 2018-03-06 NOTE — Telephone Encounter (Signed)
[name] called confused because she got a Lantus pen at [facility] and she has been on Levemir. I advised her that she should call the health department to get Levemir pens this week, but that it would be okay to use the Lantus 20 units at bedtime instead of the levemir if she has to. She verbalized understanding and was given the health departments phone number and advised to call them this week to start the process of getting levemor.

## 2018-03-07 NOTE — Progress Notes (Signed)
Internal Medicine Clinic Attending  Case discussed with Dr. Harbrecht at the time of the visit.  We reviewed the resident's history and exam and pertinent patient test results.  I agree with the assessment, diagnosis, and plan of care documented in the resident's note.   

## 2018-03-14 ENCOUNTER — Ambulatory Visit (INDEPENDENT_AMBULATORY_CARE_PROVIDER_SITE_OTHER): Payer: Self-pay | Admitting: Internal Medicine

## 2018-03-14 ENCOUNTER — Other Ambulatory Visit: Payer: Self-pay

## 2018-03-14 VITALS — BP 169/71 | HR 75 | Temp 98.1°F | Wt 173.7 lb

## 2018-03-14 DIAGNOSIS — Z794 Long term (current) use of insulin: Secondary | ICD-10-CM

## 2018-03-14 DIAGNOSIS — E1136 Type 2 diabetes mellitus with diabetic cataract: Secondary | ICD-10-CM

## 2018-03-14 DIAGNOSIS — I110 Hypertensive heart disease with heart failure: Secondary | ICD-10-CM

## 2018-03-14 DIAGNOSIS — I10 Essential (primary) hypertension: Secondary | ICD-10-CM

## 2018-03-14 DIAGNOSIS — E1165 Type 2 diabetes mellitus with hyperglycemia: Secondary | ICD-10-CM

## 2018-03-14 DIAGNOSIS — L602 Onychogryphosis: Secondary | ICD-10-CM

## 2018-03-14 DIAGNOSIS — Z79899 Other long term (current) drug therapy: Secondary | ICD-10-CM

## 2018-03-14 DIAGNOSIS — E1151 Type 2 diabetes mellitus with diabetic peripheral angiopathy without gangrene: Secondary | ICD-10-CM

## 2018-03-14 DIAGNOSIS — IMO0002 Reserved for concepts with insufficient information to code with codable children: Secondary | ICD-10-CM

## 2018-03-14 DIAGNOSIS — Z0000 Encounter for general adult medical examination without abnormal findings: Secondary | ICD-10-CM

## 2018-03-14 DIAGNOSIS — H269 Unspecified cataract: Secondary | ICD-10-CM

## 2018-03-14 DIAGNOSIS — I5042 Chronic combined systolic (congestive) and diastolic (congestive) heart failure: Secondary | ICD-10-CM

## 2018-03-14 LAB — GLUCOSE, CAPILLARY: Glucose-Capillary: 77 mg/dL (ref 70–99)

## 2018-03-14 NOTE — Progress Notes (Signed)
CC: Follow-up of hypertension and diabetes  HPI: [name] is a 62 y.o.  with a PMH listed below presenting follow-up for hypertension and diabetes.  Patient was seen 2 weeks ago and was noted to have an A1c of 14, increased from 7 on her last visit.  She had not been taking any medications for 4 months and had misplaced her glucometer.  She was restarted on Levemir 20 units, Janumet 50-1000 twice daily.  She had a glucometer prescribed however was not provided this and she went to the pharmacy to pick up her medications.  Appears that there is a prescription at the pharmacy.  She has not been able to check her blood sugar levels.  She does report that her urination frequency has decreased as well as her excessive thirst.  She also reports less urination at night.  She has had some symptoms of hypoglycemia including lightheadedness and some mild dizziness that has happened about once per week, she is unsure what her sugars were at that time.  She also reports some blurry vision that is on and off, cruise in both eyes, no issues any specific fields of vision.  She has a history of cataracts but has not seen an eye doctor for a couple of years.   She has restarted her losartan and carvedilol and amlodipine on her last visit.  Reports that she did not take any medications this morning because she did not eat.  She reports she did not eat to prevent her blood sugars from being high today.  Blood pressure today was 169/71.  Is unclear where her blood pressures have been in her last visit so we will continue her current medication regimen for now.  Please see A&P for status of the patient's chronic medical conditions  Past Medical History:  Diagnosis Date  . Arthritis   . CHF (congestive heart failure) ([place])    2014...@ [facility]  . Diabetes mellitus    diagnosed 2001  . Healthcare maintenance 06/16/2015  . Hypertension   . Influenza A 06/01/2017  . Influenza, pneumonia 06/01/2017  . Left flank  pain 10/11/2016  . Problem with sexual relationship 10/13/2017  . PVD (peripheral vascular disease) ([place])    Review of Systems: Refer to history of present illness and assessment and plans for pertinent review of systems, all others reviewed and negative.  Physical Exam:  Vitals:   03/14/18 0932  BP: (!) 169/71  Pulse: 75  Temp: 98.1 F (36.7 C)  TempSrc: Oral  SpO2: 100%  Weight: 173 lb 11.2 oz (78.8 kg)   Physical Exam  Constitutional: She is oriented to person, place, and time and well-developed, well-nourished, and in no distress.  HENT:  Head: Normocephalic and atraumatic.  Eyes: Pupils are equal, round, and reactive to light. Conjunctivae and EOM are normal.  Neck: Normal range of motion. Neck supple. No thyromegaly present.  Cardiovascular: Normal rate, regular rhythm and normal heart sounds. Exam reveals no gallop and no friction rub.  No murmur heard. Pulmonary/Chest: Effort normal and breath sounds normal. No respiratory distress. She has no wheezes.  Abdominal: Soft. Bowel sounds are normal. She exhibits no distension.  Musculoskeletal: Normal range of motion. She exhibits no edema.  Thickened toenails  Neurological: She is alert and oriented to person, place, and time. Gait normal.  Skin: Skin is warm and dry. No erythema.  No rashes or lesions on feet  Psychiatric: Mood and affect normal.    Social History   Socioeconomic  History  . Marital status: Married    Spouse name: Not on file  . Number of children: Not on file  . Years of education: 12,college  . Highest education level: Not on file  Occupational History  . Not on file  Social Needs  . Financial resource strain: Not on file  . Food insecurity:    Worry: Not on file    Inability: Not on file  . Transportation needs:    Medical: Not on file    Non-medical: Not on file  Tobacco Use  . Smoking status: Never Smoker  . Smokeless tobacco: Never Used  Substance and Sexual Activity  . Alcohol use:  No    Alcohol/week: 0.0 standard drinks    Comment: ocassionally  . Drug use: No  . Sexual activity: Yes  Lifestyle  . Physical activity:    Days per week: Not on file    Minutes per session: Not on file  . Stress: Not on file  Relationships  . Social connections:    Talks on phone: Not on file    Gets together: Not on file    Attends religious service: Not on file    Active member of club or organization: Not on file    Attends meetings of clubs or organizations: Not on file    Relationship status: Not on file  . Intimate partner violence:    Fear of current or ex partner: Not on file    Emotionally abused: Not on file    Physically abused: Not on file    Forced sexual activity: Not on file  Other Topics Concern  . Not on file  Social History Narrative  . Not on file   Family History  Problem Relation Age of Onset  . Diabetes Mother     Assessment & Plan:   See Encounters Tab for problem based charting.  Patient seen with Dr. Beryle Beams'

## 2018-03-14 NOTE — Patient Instructions (Addendum)
Thank you for allowing [place] to provide your care today. Today we discussed your diabetes and high blood pressure    I have ordered some labs for you. I will call if any are abnormal.    Today we made no changes to your medications. Please pick up the glucometer at the pharmacy, if you have any issues please contact the clinic and I will send in a new prescription.    Please try to eat 3-5 times a day and don't skip meals. This can drop your blood sugars and cause things like dizziness, lightheadedness, shaking, or sweating.  Please check your blood sugars up to 3 times a day, check it early in the morning and after meals.   We have sent in referrals for the eye doctor, foot doctor, and for the colonoscopy.   Please follow-up in 1 month with your already scheduled appointment.    Should you have any questions or concerns please call the internal medicine clinic at [phone].

## 2018-03-15 LAB — BMP8+ANION GAP
Anion Gap: 15 mmol/L (ref 10.0–18.0)
BUN / CREAT RATIO: 13 (ref 12–28)
BUN: 17 mg/dL (ref 8–27)
CO2: 19 mmol/L — AB (ref 20–29)
CREATININE: 1.34 mg/dL — AB (ref 0.57–1.00)
Calcium: 9.2 mg/dL (ref 8.7–10.3)
Chloride: 112 mmol/L — ABNORMAL HIGH (ref 96–106)
GFR calc non Af Amer: 43 mL/min/{1.73_m2} — ABNORMAL LOW (ref >59)
GFR, EST AFRICAN AMERICAN: 50 mL/min/{1.73_m2} — AB (ref >59)
Glucose: 99 mg/dL (ref 65–99)
Potassium: 4.3 mmol/L (ref 3.5–5.2)
Sodium: 146 mmol/L — ABNORMAL HIGH (ref 134–144)

## 2018-03-15 NOTE — Progress Notes (Signed)
Medicine attending: I personally interviewed and briefly examined this patient on the day of the patient visit and reviewed pertinent clinical ,laboratory, and radiographic data  with resident physician Dr. Lonia Skinner and we discussed a management plan.

## 2018-03-15 NOTE — Assessment & Plan Note (Addendum)
[name] has restarted her losartan and carvedilol and amlodipine on her last visit.  Reports that she did not take any medications this morning because she did not eat.  She reports she did not eat to prevent her blood sugars from being high today.  Blood pressure today was 169/71.  Is unclear where her blood pressures have been in her last visit so we will continue her current medication regimen for now.  Plan: -Check BMP -Continue losartan 100 mg, carvedilol 12.5 twice daily, and amlodipine 10 mg daily

## 2018-03-15 NOTE — Assessment & Plan Note (Signed)
Ordered colonoscopy. Mammogram has still not been completed yet.

## 2018-03-15 NOTE — Assessment & Plan Note (Signed)
Patient was seen 2 weeks ago and was noted to have an A1c of 14, increased from 7 on her last visit.  She had not been taking any medications for 4 months and had misplaced her glucometer.  She was restarted on Levemir 20 units, Janumet 50-1000 twice daily.  She had a glucometer prescribed however was not provided this and she went to the pharmacy to pick up her medications.  Appears that there is a prescription at the pharmacy.  She has not been able to check her blood sugar levels.  She does report that her urination frequency has decreased as well as her excessive thirst.  She also reports less urination at night.  She has had some symptoms of hypoglycemia including lightheadedness and some mild dizziness that has happened about once per week, she is unsure what her sugars were at that time.  She also reports some blurry vision that is on and off, cruise in both eyes, no issues any specific fields of vision.  She has a history of cataracts but has not seen an eye doctor for a couple of years.   Today her blood sugar was 77, she reports she did not eat this morning so that blood sugar was not elevated today.   Plan: -We will continue Levemir 20 units and Janumet twice daily for now since we do not know where her blood sugars have been for the past 2 weeks -Advised patient to pick up glucometer today and check blood sugars up to 3 times a day and contact us in about a week to determine where her blood sugars are -Advised patient to not skip meals -Ophthalmology referral -Podiatry referral

## 2018-03-15 NOTE — Assessment & Plan Note (Signed)
She is currently on carvedilol and lostartan. She denies and shortness of breath, edema, or fatigue. Will continue current medications.

## 2018-03-16 NOTE — Addendum Note (Signed)
Addended by: Hulan Fray on: 03/16/2018 04:26 PM   Modules accepted: Orders

## 2018-03-28 MED FILL — LOSARTAN POTASSIUM 100 MG T: 100 | 30 days supply | Qty: 30 | Fill #1

## 2018-03-28 MED FILL — ATORVASTATIN 40 MG TABLET: 40 | 30 days supply | Qty: 30 | Fill #1

## 2018-03-28 MED FILL — LANTUS SOLOSTAR 100 UNITS/M: 100 | 15 days supply | Qty: 3 | Fill #1

## 2018-04-02 ENCOUNTER — Ambulatory Visit: Payer: Self-pay

## 2018-04-04 MED FILL — CARVEDILOL 12.5 MG TABLET: 12.5 | 30 days supply | Qty: 60 | Fill #1

## 2018-04-05 ENCOUNTER — Inpatient Hospital Stay (HOSPITAL_COMMUNITY)
Admission: EM | Admit: 2018-04-05 | Discharge: 2018-04-07 | DRG: 291 | Disposition: A | Discharge status: Home / Self Care | Payer: Medicaid Other | Attending: Internal Medicine | Admitting: Internal Medicine

## 2018-04-05 ENCOUNTER — Encounter (HOSPITAL_COMMUNITY): Payer: Self-pay | Admitting: Emergency Medicine

## 2018-04-05 DIAGNOSIS — Z888 Allergy status to other drugs, medicaments and biological substances status: Secondary | ICD-10-CM

## 2018-04-05 DIAGNOSIS — I447 Left bundle-branch block, unspecified: Secondary | ICD-10-CM | POA: Diagnosis not present

## 2018-04-05 DIAGNOSIS — I5031 Acute diastolic (congestive) heart failure: Secondary | ICD-10-CM

## 2018-04-05 DIAGNOSIS — R0602 Shortness of breath: Secondary | ICD-10-CM | POA: Diagnosis not present

## 2018-04-05 DIAGNOSIS — J9601 Acute respiratory failure with hypoxia: Secondary | ICD-10-CM | POA: Diagnosis present

## 2018-04-05 DIAGNOSIS — Z794 Long term (current) use of insulin: Secondary | ICD-10-CM

## 2018-04-05 DIAGNOSIS — I214 Non-ST elevation (NSTEMI) myocardial infarction: Secondary | ICD-10-CM

## 2018-04-05 DIAGNOSIS — I110 Hypertensive heart disease with heart failure: Principal | ICD-10-CM | POA: Diagnosis present

## 2018-04-05 DIAGNOSIS — Z9114 Patient's other noncompliance with medication regimen: Secondary | ICD-10-CM

## 2018-04-05 DIAGNOSIS — Z820 Family history of epilepsy and other diseases of the nervous system: Secondary | ICD-10-CM

## 2018-04-05 DIAGNOSIS — I5043 Acute on chronic combined systolic (congestive) and diastolic (congestive) heart failure: Secondary | ICD-10-CM | POA: Diagnosis present

## 2018-04-05 DIAGNOSIS — Z7982 Long term (current) use of aspirin: Secondary | ICD-10-CM

## 2018-04-05 DIAGNOSIS — Z8249 Family history of ischemic heart disease and other diseases of the circulatory system: Secondary | ICD-10-CM

## 2018-04-05 DIAGNOSIS — Z9851 Tubal ligation status: Secondary | ICD-10-CM

## 2018-04-05 DIAGNOSIS — I10 Essential (primary) hypertension: Secondary | ICD-10-CM | POA: Diagnosis not present

## 2018-04-05 DIAGNOSIS — Z79899 Other long term (current) drug therapy: Secondary | ICD-10-CM

## 2018-04-05 DIAGNOSIS — Z833 Family history of diabetes mellitus: Secondary | ICD-10-CM

## 2018-04-05 DIAGNOSIS — M1611 Unilateral primary osteoarthritis, right hip: Secondary | ICD-10-CM

## 2018-04-05 DIAGNOSIS — Z87891 Personal history of nicotine dependence: Secondary | ICD-10-CM

## 2018-04-05 DIAGNOSIS — I428 Other cardiomyopathies: Secondary | ICD-10-CM | POA: Diagnosis present

## 2018-04-05 DIAGNOSIS — R112 Nausea with vomiting, unspecified: Secondary | ICD-10-CM | POA: Diagnosis not present

## 2018-04-05 DIAGNOSIS — M1990 Unspecified osteoarthritis, unspecified site: Secondary | ICD-10-CM | POA: Diagnosis present

## 2018-04-05 DIAGNOSIS — I509 Heart failure, unspecified: Secondary | ICD-10-CM

## 2018-04-05 DIAGNOSIS — E1165 Type 2 diabetes mellitus with hyperglycemia: Secondary | ICD-10-CM | POA: Diagnosis present

## 2018-04-05 DIAGNOSIS — IMO0002 Reserved for concepts with insufficient information to code with codable children: Secondary | ICD-10-CM

## 2018-04-05 DIAGNOSIS — Z96642 Presence of left artificial hip joint: Secondary | ICD-10-CM | POA: Diagnosis present

## 2018-04-05 DIAGNOSIS — E113299 Type 2 diabetes mellitus with mild nonproliferative diabetic retinopathy without macular edema, unspecified eye: Secondary | ICD-10-CM | POA: Diagnosis present

## 2018-04-05 DIAGNOSIS — R0902 Hypoxemia: Secondary | ICD-10-CM | POA: Diagnosis not present

## 2018-04-05 DIAGNOSIS — E1151 Type 2 diabetes mellitus with diabetic peripheral angiopathy without gangrene: Secondary | ICD-10-CM | POA: Diagnosis present

## 2018-04-05 NOTE — ED Triage Notes (Signed)
BIB EMS from home, pt reports onset of SOB while laying in bed. Sats low 80's upon Fire arrival, placed pt on 2L [place], sats up to low 90's. Pt has hx of CHF, hypertensive en route.

## 2018-04-05 NOTE — ED Notes (Signed)
Correction, pt placed on 6L [place]

## 2018-04-06 ENCOUNTER — Other Ambulatory Visit: Payer: Self-pay

## 2018-04-06 ENCOUNTER — Encounter (HOSPITAL_COMMUNITY): Payer: Self-pay

## 2018-04-06 ENCOUNTER — Emergency Department (HOSPITAL_COMMUNITY): Payer: Medicaid Other

## 2018-04-06 DIAGNOSIS — I447 Left bundle-branch block, unspecified: Secondary | ICD-10-CM | POA: Diagnosis present

## 2018-04-06 DIAGNOSIS — I5043 Acute on chronic combined systolic (congestive) and diastolic (congestive) heart failure: Secondary | ICD-10-CM | POA: Diagnosis present

## 2018-04-06 DIAGNOSIS — Z9851 Tubal ligation status: Secondary | ICD-10-CM | POA: Diagnosis not present

## 2018-04-06 DIAGNOSIS — Z96642 Presence of left artificial hip joint: Secondary | ICD-10-CM | POA: Diagnosis present

## 2018-04-06 DIAGNOSIS — I110 Hypertensive heart disease with heart failure: Principal | ICD-10-CM

## 2018-04-06 DIAGNOSIS — I509 Heart failure, unspecified: Secondary | ICD-10-CM

## 2018-04-06 DIAGNOSIS — Z87891 Personal history of nicotine dependence: Secondary | ICD-10-CM | POA: Diagnosis not present

## 2018-04-06 DIAGNOSIS — Z888 Allergy status to other drugs, medicaments and biological substances status: Secondary | ICD-10-CM | POA: Diagnosis not present

## 2018-04-06 DIAGNOSIS — Z794 Long term (current) use of insulin: Secondary | ICD-10-CM | POA: Diagnosis not present

## 2018-04-06 DIAGNOSIS — E118 Type 2 diabetes mellitus with unspecified complications: Secondary | ICD-10-CM

## 2018-04-06 DIAGNOSIS — E1165 Type 2 diabetes mellitus with hyperglycemia: Secondary | ICD-10-CM | POA: Diagnosis present

## 2018-04-06 DIAGNOSIS — Z79899 Other long term (current) drug therapy: Secondary | ICD-10-CM

## 2018-04-06 DIAGNOSIS — I428 Other cardiomyopathies: Secondary | ICD-10-CM | POA: Diagnosis present

## 2018-04-06 DIAGNOSIS — J9601 Acute respiratory failure with hypoxia: Secondary | ICD-10-CM | POA: Diagnosis present

## 2018-04-06 DIAGNOSIS — Z833 Family history of diabetes mellitus: Secondary | ICD-10-CM | POA: Diagnosis not present

## 2018-04-06 DIAGNOSIS — M1990 Unspecified osteoarthritis, unspecified site: Secondary | ICD-10-CM | POA: Diagnosis present

## 2018-04-06 DIAGNOSIS — Z9114 Patient's other noncompliance with medication regimen: Secondary | ICD-10-CM | POA: Diagnosis not present

## 2018-04-06 DIAGNOSIS — Z7982 Long term (current) use of aspirin: Secondary | ICD-10-CM | POA: Diagnosis not present

## 2018-04-06 DIAGNOSIS — I214 Non-ST elevation (NSTEMI) myocardial infarction: Secondary | ICD-10-CM | POA: Diagnosis present

## 2018-04-06 DIAGNOSIS — Z820 Family history of epilepsy and other diseases of the nervous system: Secondary | ICD-10-CM | POA: Diagnosis not present

## 2018-04-06 DIAGNOSIS — Z8249 Family history of ischemic heart disease and other diseases of the circulatory system: Secondary | ICD-10-CM | POA: Diagnosis not present

## 2018-04-06 DIAGNOSIS — E1151 Type 2 diabetes mellitus with diabetic peripheral angiopathy without gangrene: Secondary | ICD-10-CM | POA: Diagnosis present

## 2018-04-06 DIAGNOSIS — E113299 Type 2 diabetes mellitus with mild nonproliferative diabetic retinopathy without macular edema, unspecified eye: Secondary | ICD-10-CM | POA: Diagnosis present

## 2018-04-06 LAB — CBC WITH DIFFERENTIAL/PLATELET
BASOS PCT: 0 %
Band Neutrophils: 0 %
Basophils Absolute: 0 10*3/uL (ref 0.0–0.1)
Blasts: 0 %
Eosinophils Absolute: 0.1 10*3/uL (ref 0.0–0.5)
Eosinophils Relative: 1 %
HCT: 37 % (ref 36.0–46.0)
Hemoglobin: 11.4 g/dL — ABNORMAL LOW (ref 12.0–15.0)
Lymphocytes Relative: 27 %
Lymphs Abs: 2.1 10*3/uL (ref 0.7–4.0)
MCH: 28.1 pg (ref 26.0–34.0)
MCHC: 30.8 g/dL (ref 30.0–36.0)
MCV: 91.1 fL (ref 80.0–100.0)
Metamyelocytes Relative: 0 %
Monocytes Absolute: 0.4 10*3/uL (ref 0.1–1.0)
Monocytes Relative: 5 %
Myelocytes: 0 %
Neutro Abs: 5.1 10*3/uL (ref 1.7–7.7)
Neutrophils Relative %: 67 %
Other: 0 %
Platelets: 236 10*3/uL (ref 150–400)
Promyelocytes Relative: 0 %
RBC: 4.06 MIL/uL (ref 3.87–5.11)
RDW: 14.4 % (ref 11.5–15.5)
WBC: 7.7 10*3/uL (ref 4.0–10.5)
nRBC: 0 % (ref 0.0–0.2)
nRBC: 0 /100 WBC

## 2018-04-06 LAB — URINALYSIS, ROUTINE W REFLEX MICROSCOPIC
BILIRUBIN URINE: NEGATIVE
Glucose, UA: NEGATIVE mg/dL
Hgb urine dipstick: NEGATIVE
Ketones, ur: NEGATIVE mg/dL
Leukocytes, UA: NEGATIVE
Nitrite: NEGATIVE
Protein, ur: NEGATIVE mg/dL
Specific Gravity, Urine: 1.005 (ref 1.005–1.030)
pH: 5 (ref 5.0–8.0)

## 2018-04-06 LAB — TROPONIN I
Troponin I: 0.04 ng/mL (ref <0.03)
Troponin I: 0.05 ng/mL (ref <0.03)
Troponin I: 0.05 ng/mL (ref <0.03)
Troponin I: 0.05 ng/mL (ref <0.03)

## 2018-04-06 LAB — BASIC METABOLIC PANEL
ANION GAP: 12 (ref 5–15)
BUN: 20 mg/dL (ref 8–23)
CO2: 23 mmol/L (ref 22–32)
Calcium: 9.2 mg/dL (ref 8.9–10.3)
Chloride: 106 mmol/L (ref 98–111)
Creatinine, Ser: 1.51 mg/dL — ABNORMAL HIGH (ref 0.44–1.00)
GFR calc Af Amer: 43 mL/min — ABNORMAL LOW (ref >60)
GFR calc non Af Amer: 37 mL/min — ABNORMAL LOW (ref >60)
GLUCOSE: 108 mg/dL — AB (ref 70–99)
Potassium: 3.7 mmol/L (ref 3.5–5.1)
Sodium: 141 mmol/L (ref 135–145)

## 2018-04-06 LAB — CBG MONITORING, ED
GLUCOSE-CAPILLARY: 80 mg/dL (ref 70–99)
Glucose-Capillary: 103 mg/dL — ABNORMAL HIGH (ref 70–99)

## 2018-04-06 LAB — GLUCOSE, CAPILLARY
Glucose-Capillary: 105 mg/dL — ABNORMAL HIGH (ref 70–99)
Glucose-Capillary: 305 mg/dL — ABNORMAL HIGH (ref 70–99)

## 2018-04-06 LAB — BRAIN NATRIURETIC PEPTIDE: B NATRIURETIC PEPTIDE 5: 342.3 pg/mL — AB (ref 0.0–100.0)

## 2018-04-06 MED ORDER — SODIUM CHLORIDE 0.9% FLUSH
3.0000 mL | Freq: Two times a day (BID) | INTRAVENOUS | Status: DC
  Administered 2018-04-06 – 2018-04-07 (×3): 3 mL via INTRAVENOUS

## 2018-04-06 MED ORDER — INSULIN ASPART 100 UNIT/ML ~~LOC~~ SOLN: 0.0000 [IU] | Freq: Three times a day (TID) | SUBCUTANEOUS | Status: DC

## 2018-04-06 MED ORDER — LOSARTAN POTASSIUM 50 MG PO TABS
100.0000 mg | ORAL_TABLET | Freq: Every day | ORAL | Status: DC
  Administered 2018-04-06 – 2018-04-07 (×2): 100 mg via ORAL
  Filled 2018-04-06 (×2): qty 2

## 2018-04-06 MED ORDER — NITROGLYCERIN 2 % TD OINT
1.0000 [in_us] | TOPICAL_OINTMENT | Freq: Once | TRANSDERMAL | Status: AC
  Administered 2018-04-06: 1 [in_us] via TOPICAL
  Filled 2018-04-06: qty 1

## 2018-04-06 MED ORDER — FUROSEMIDE 10 MG/ML IJ SOLN: 40.0000 mg | Freq: Every day | INTRAMUSCULAR | Status: DC

## 2018-04-06 MED ORDER — ONDANSETRON HCL 4 MG PO TABS: 4.0000 mg | ORAL_TABLET | Freq: Four times a day (QID) | ORAL | Status: DC | PRN

## 2018-04-06 MED ORDER — CARVEDILOL 12.5 MG PO TABS
12.5000 mg | ORAL_TABLET | Freq: Two times a day (BID) | ORAL | Status: DC
  Administered 2018-04-06 – 2018-04-07 (×3): 12.5 mg via ORAL
  Filled 2018-04-06 (×4): qty 1

## 2018-04-06 MED ORDER — INSULIN GLARGINE 100 UNIT/ML ~~LOC~~ SOLN
18.0000 [IU] | Freq: Every day | SUBCUTANEOUS | Status: DC
  Administered 2018-04-06: 18 [IU] via SUBCUTANEOUS
  Filled 2018-04-06: qty 0.18

## 2018-04-06 MED ORDER — ENOXAPARIN SODIUM 40 MG/0.4ML ~~LOC~~ SOLN
40.0000 mg | Freq: Every day | SUBCUTANEOUS | Status: DC
  Administered 2018-04-07: 40 mg via SUBCUTANEOUS
  Filled 2018-04-06 (×2): qty 0.4

## 2018-04-06 MED ORDER — ONDANSETRON HCL 4 MG/2ML IJ SOLN: 4.0000 mg | Freq: Four times a day (QID) | INTRAMUSCULAR | Status: DC | PRN

## 2018-04-06 MED ORDER — POLYETHYLENE GLYCOL 3350 17 G PO PACK: 17.0000 g | PACK | Freq: Every day | ORAL | Status: DC | PRN

## 2018-04-06 MED ORDER — ACETAMINOPHEN 325 MG PO TABS: 650.0000 mg | ORAL_TABLET | Freq: Four times a day (QID) | ORAL | Status: DC | PRN

## 2018-04-06 MED ORDER — ASPIRIN 81 MG PO CHEW
324.0000 mg | CHEWABLE_TABLET | Freq: Once | ORAL | Status: AC
  Administered 2018-04-06: 324 mg via ORAL
  Filled 2018-04-06: qty 4

## 2018-04-06 MED ORDER — FUROSEMIDE 10 MG/ML IJ SOLN
40.0000 mg | Freq: Once | INTRAMUSCULAR | Status: AC
  Administered 2018-04-06: 40 mg via INTRAVENOUS
  Filled 2018-04-06: qty 4

## 2018-04-06 MED ORDER — INSULIN GLARGINE 100 UNIT/ML ~~LOC~~ SOLN: 20.0000 [IU] | Freq: Every day | SUBCUTANEOUS | Status: DC

## 2018-04-06 MED ORDER — ASPIRIN EC 81 MG PO TBEC
81.0000 mg | DELAYED_RELEASE_TABLET | Freq: Every day | ORAL | Status: DC
  Administered 2018-04-06 – 2018-04-07 (×2): 81 mg via ORAL
  Filled 2018-04-06 (×2): qty 1

## 2018-04-06 MED ORDER — ATORVASTATIN CALCIUM 40 MG PO TABS
40.0000 mg | ORAL_TABLET | Freq: Every day | ORAL | Status: DC
  Administered 2018-04-06 – 2018-04-07 (×2): 40 mg via ORAL
  Filled 2018-04-06 (×2): qty 1

## 2018-04-06 MED ORDER — SPIRONOLACTONE 12.5 MG HALF TABLET
12.5000 mg | ORAL_TABLET | Freq: Every day | ORAL | Status: DC
  Administered 2018-04-06 – 2018-04-07 (×2): 12.5 mg via ORAL
  Filled 2018-04-06 (×2): qty 1

## 2018-04-06 MED ORDER — ACETAMINOPHEN 650 MG RE SUPP: 650.0000 mg | Freq: Four times a day (QID) | RECTAL | Status: DC | PRN

## 2018-04-06 MED ORDER — INSULIN ASPART 100 UNIT/ML ~~LOC~~ SOLN
0.0000 [IU] | Freq: Every day | SUBCUTANEOUS | Status: DC
  Administered 2018-04-06: 3 [IU] via SUBCUTANEOUS

## 2018-04-06 NOTE — ED Notes (Signed)
Paged admitting dr Reesa Chew for rn tray, [name]

## 2018-04-06 NOTE — ED Notes (Signed)
Pt able to ambulate to bathroom on room air. Pt SPO2 >97% while walking on room air. SPO2 91% when done ambulating. Back up to 95% with rest. Pt SOB. Placed on 2 lnc

## 2018-04-06 NOTE — Evaluation (Signed)
Physical Therapy Evaluation Patient Details Name: [name] MRN: [id number] DOB: Sep 01, 1955 Today's Date: 04/06/2018   History of Present Illness  62 yo female with onset of CHF and acute respiratory failure was admitted and noted her troponin elevation, atelectasis and has need to ck O2 sats wtih all effort.  Had chest tightness,  PMHx:  cardiomegaly, DM, PVD, CHF, PNA  Clinical Impression  Pt was seen for evaluation of mobility and to assess her O2 sats.  Pt has full control of sats with 90% or greater with all exertion, including stairs with HHA.  Pt is appropriate for dc home with nursing to assist her to mobilize more and will need a SPC to climb stairs at home, which she has.  This would be useful for PT to practice on her next visit before DC.  Will follow acutely for stairs and SPC next visit.    Follow Up Recommendations Home health PT;Supervision for mobility/OOB    Equipment Recommendations  Rolling walker with 5" wheels(it pt does not have adequate one.)    Recommendations for Other Services       Precautions / Restrictions Precautions Precautions: Fall Precaution Comments: has telemetry Restrictions Weight Bearing Restrictions: No  Monitor O2 sats with gait, try SPC with next trip on stairs.     Mobility  Bed Mobility Overal bed mobility: Modified Independent                Transfers Overall transfer level: Modified independent               General transfer comment: reminders about hand placemetn  Ambulation/Gait Ambulation/Gait assistance: Min assist Gait Distance (Feet): 120 Feet Assistive device: Rolling walker (2 wheeled);1 person hand held assist Gait Pattern/deviations: Step-through pattern;Wide base of support;Trunk flexed Gait velocity: reduced Gait velocity interpretation: <1.31 ft/sec, indicative of household ambulator General Gait Details: wide base with slow turns to manage hallway walking wiht O2 monitor in  place  Stairs Stairs: Yes Stairs assistance: Min assist Stair Management: No rails;Forwards;Step to pattern Number of Stairs: 3 General stair comments: pt is unable to walk on stairs at home without help due to lack of railing  Wheelchair Mobility    Modified Rankin (Stroke Patients Only)       Balance Overall balance assessment: Needs assistance   Sitting balance-Leahy Scale: Fair     Standing balance support: Bilateral upper extremity supported;During functional activity Standing balance-Leahy Scale: Fair                               Pertinent Vitals/Pain Pain Assessment: No/denies pain    Home Living Family/patient expects to be discharged to:: Private residence Living Arrangements: Spouse/significant other Available Help at Discharge: Family;Available 24 hours/day Type of Home: House Home Access: Stairs to enter Entrance Stairs-Rails: None Entrance Stairs-Number of Steps: 3 Home Layout: One level Home Equipment: Walker - 2 wheels;Cane - single point Additional Comments: Pt is able to walk on [facility] typically but is weaker now    Prior Function Level of Independence: Independent with assistive device(s)               Hand Dominance   Dominant Hand: Right    Extremity/Trunk Assessment   Upper Extremity Assessment Upper Extremity Assessment: Overall WFL for tasks assessed    Lower Extremity Assessment Lower Extremity Assessment: Generalized weakness    Cervical / Trunk Assessment Cervical / Trunk Assessment: Normal  Communication  Communication: Expressive difficulties(speech is a little difficult to understand)  Cognition Arousal/Alertness: Awake/alert Behavior During Therapy: WFL for tasks assessed/performed Overall Cognitive Status: Within Functional Limits for tasks assessed                                 General Comments: pt is aware of her need to monitor O2      General Comments      Exercises      Assessment/Plan    PT Assessment Patient needs continued PT services  PT Problem List Decreased strength;Decreased range of motion;Decreased activity tolerance;Decreased balance;Decreased mobility;Decreased coordination;Decreased knowledge of use of DME;Decreased safety awareness;Cardiopulmonary status limiting activity;Obesity       PT Treatment Interventions DME instruction;Gait training;Stair training;Functional mobility training;Therapeutic activities;Therapeutic exercise;Balance training;Neuromuscular re-education;Patient/family education    PT Goals (Current goals can be found in the Care Plan section)  Acute Rehab PT Goals Patient Stated Goal: to walk and go home PT Goal Formulation: With patient/family Time For Goal Achievement: 04/20/18 Potential to Achieve Goals: Good    Frequency Min 3X/week   Barriers to discharge Inaccessible home environment;Decreased caregiver support home with her husband who may not be able to assist her up the stairs.    Co-evaluation               AM-PAC PT "6 Clicks" Mobility  Outcome Measure Help needed turning from your back to your side while in a flat bed without using bedrails?: A Little Help needed moving from lying on your back to sitting on the side of a flat bed without using bedrails?: A Little Help needed moving to and from a bed to a chair (including a wheelchair)?: A Little Help needed standing up from a chair using your arms (e.g., wheelchair or bedside chair)?: A Little Help needed to walk in hospital room?: A Little Help needed climbing 3-5 steps with a railing? : A Little 6 Click Score: 18    End of Session Equipment Utilized During Treatment: Gait belt Activity Tolerance: Patient limited by fatigue Patient left: in bed;with call bell/phone within reach;with bed alarm set;with family/visitor present Nurse Communication: Mobility status PT Visit Diagnosis: Unsteadiness on feet (R26.81);Other abnormalities of gait  and mobility (R26.89);Repeated falls (R29.6);Muscle weakness (generalized) (M62.81);Difficulty in walking, not elsewhere classified (R26.2)    Time: 8756-4332 PT Time Calculation (min) (ACUTE ONLY): 37 min   Charges:   PT Evaluation $PT Eval Moderate Complexity: 1 Mod PT Treatments $Gait Training: 8-22 mins       Ramond Dial 04/06/2018, 6:41 PM   Mee Hives, PT MS Acute Rehab Dept. Number: [place] [facility] [place]

## 2018-04-06 NOTE — ED Notes (Signed)
md updated troponin

## 2018-04-06 NOTE — ED Notes (Signed)
ORDERED A HEART HEALTHY/CARB MOD BREAKFAST--Vaibhav Fogleman

## 2018-04-06 NOTE — Progress Notes (Signed)
   Subjective:  [name] reports that she feels "a lot better today" than she did prior to admission. She reports of a 2 week history of DOE. At the interim, she had also been experiencing orthopnea, cough, PND, panic. Her symptoms had been worsening this eventually prompted her to report to the emergency department. She denies chest pain but endorses chest tightness. She also confirmed of never taking Lasix. Also denies recent stressful activities. Her daughter just had a granddaughter and has been doing more activities than normal. She does worry quite a lot about her granddaughter. Husband does report that he thought she had a "cold" recently  Objective:  Vital signs in last 24 hours: Vitals:   04/06/18 0115 04/06/18 0130 04/06/18 0230 04/06/18 0245  BP: (!) 176/82 (!) 165/85 (!) 166/77   Pulse: 83 82 81   Resp: 16 17    Temp:    97.7 F (36.5 C)  TempSrc:    Oral  SpO2: 100% 100% 98%   Weight:      Height:       Physical Exam  Constitutional: She is well-developed, well-nourished, and in no distress.  Sitting up in bed, in no acute distress.  Neurological: She is alert.  Psychiatric: Affect normal.  Nursing note and vitals reviewed.   Assessment/Plan:  Active Problems:   Acute on chronic heart failure ([place])  [name] is a 62 y/o female with PMHx combined heart failure, uncontrolled DM, HTN who was admitted with acute hypoxic respiratory failure and found to have a mildly elevated BNP at 342. CXR showed with mild congestion concerning for acute on chronic heart failure. She was given Lasix overnight and appears to be euvolemic this morning. She does not have signs of a pneumonia but may still have a viral respiratory infection which caused her to become short of breath. We will observe her through out the day.  Acute Hypoxic Respiratory Failure:  1. Supplemental oxygen as needed 2. PT/OT Eval placed  Acute on chronic combined heart failure: Appears euvolemic on exam. Now  s/p Lasix 40 mg. 1. Continue to monitor 2. Strict I&O's 3. Repeat ECHO  Uncontrolled DM:  1. A1C>14 2. continue home Lantus 20 units and SSI with meals   HTN:  1. continuing Losartan and Coreg 2. was supposed to resume amlodipine according to last clinic note, but she has not been taking this; will hold for now in the setting of diuresis  Diet: Heart healthy, carb modified DVT ppx: Lovenox CODE: FULL   Dispo: Anticipated discharge tomorrow.  Carroll Sage, MD 04/06/2018, 6:17 AM Pager: [phone]

## 2018-04-06 NOTE — ED Notes (Signed)
Pt placed on purewick as an alternative for urine collection

## 2018-04-06 NOTE — H&P (Addendum)
Date: 04/06/2018               Patient Name:  [name] MRN: [id number]  DOB: [birth year] Age / Sex: 62 y.o., female   PCP: Alyssa Heck, DO         Medical Service: Internal Medicine Teaching Service         Attending Physician: Dr. Evette Doffing, Mallie Mussel, *    First Contact: Dr. Alfonse Spruce Pager: [phone]  Second Contact: Dr. Shan Levans  Pager: [phone]       After Hours (After 5p/  First Contact Pager: [phone]  weekends / holidays): Second Contact Pager: [phone]   Chief Complaint: shortness of breath   History of Present Illness: [name] is a 62 y/o female with PMHx of HFrEF, recovered to HFpEF per last echo in 08/2017 with EF of 60-65%, HTN, type II DM who presents for sudden onset shortness of breath. Patient woke up late last night with paroxysmal coughing and subsequently became short of breath. She had an episode of emesis due to severe coughing. When her shortness of breath persisted despite resolution of cough, she called EMS and was found to be hypoxic in low 80s on room air which improved to 90s with 2L nasal cannula.  Patient has noticed dyspnea on exertion, light headedness, fatigue and intermittent chest tightness for the last few weeks. Lately she has not been able to complete house chores without stopping to rest. She also endorses abdominal swelling. Denies lower extremity swelling, orthopnea or PND. She does not weigh her self regularly.  She was recently started back on medications for DM, heart failure and HTN in [facility] after being off of all medications for 4 months due to financial difficulties. She endorses compliance since starting back on her meds.  She denies syncope, headaches, fevers, chills, URI symptoms, n/v, melena, hematochezia, hematuria. Does endorse intermittent difficulty with emptying her bladder but no dysuria.    Meds:  Current Meds  Medication Sig  . aspirin EC 81 MG tablet Take 1 tablet (81 mg total) by mouth daily. IM program, hope fund    . atorvastatin (LIPITOR) 40 MG tablet Take 1 tablet (40 mg total) by mouth daily.  . blood glucose meter kit and supplies KIT Dispense based on patient and insurance preference. Use up to four times daily as directed. (FOR ICD-9 250.00, 250.01).  . carvedilol (COREG) 12.5 MG tablet TAKE 1 TABLET (12.5 MG TOTAL) BY MOUTH 2 (TWO) TIMES DAILY WITH A MEAL. (Patient taking differently: Take 12.5 mg by mouth 2 (two) times daily with a meal. )  . diclofenac sodium (VOLTAREN) 1 % GEL Apply 2 g topically 4 (four) times daily as needed (pain).  . insulin glargine (LANTUS) 100 unit/mL SOPN Inject 20 Units into the skin at bedtime.  Marland Kitchen losartan (COZAAR) 100 MG tablet Take 1 tablet (100 mg total) by mouth daily.  . sitaGLIPtin-metformin (JANUMET) 50-1000 MG tablet Take 1 tablet by mouth 2 (two) times daily with a meal.     Allergies: Allergies as of 04/05/2018 - Review Complete 04/05/2018  Allergen Reaction Noted  . Lisinopril  06/29/2017  . Plavix [clopidogrel bisulfate] Rash 12/21/2012   Past Medical History:  Diagnosis Date  . Arthritis   . CHF (congestive heart failure) ([place])    2014...@ [facility]  . Diabetes mellitus    diagnosed 2001  . Healthcare maintenance 06/16/2015  . Hypertension   . Influenza A 06/01/2017  . Influenza, pneumonia 06/01/2017  . Left  flank pain 10/11/2016  . Problem with sexual relationship 10/13/2017  . PVD (peripheral vascular disease) (HCC)     Family History:  Father - with HTN, DM Mother - seizure disorder  Social History: lives in [place] with her husband; currently applying for disability primarily due to arthritis limiting her mobility. She has a remote history of smoking when she was young but has not for several years; she rarely drinks a glass of wine; no illicit drugs   Review of Systems: A complete ROS was negative except as per HPI.   Physical Exam: Blood pressure (!) 166/77, pulse 81, temperature 97.7 F (36.5 C), temperature source Oral, resp. rate  17, height _0  (1.6 m), weight 77.1 kg, SpO2 98 %. General: awake, alert, pleasant female, lying in bed in NAD HEENT: [place]/AT; conjunctiva clear; no oropharyngeal erythema or exudates; poor dentition Neck: + JVD CV: RRR; no murmurs, rubs or gallops  Pulm: normal respiratory effort; speaking in full sentences; lungs CTA bilaterally Abd: abdomen is soft, mild TTP in LLQ Ext: distal pulses intact bilaterally; no lower extremity edema Neuro: A&Ox3; follows commands appropriately; no focal deficits Psych: appropriate mood and affect    EKG: LBBB that is new compared to prior studies  CXR: personally reviewed my interpretation is no effusion or focal consolidation; mild pulmonary congestion   Assessment & Plan by Problem: Active Problems:   Acute on chronic heart failure ([place])  [name] is a 62 y/o female with PMHx combined heart failure, uncontrolled DM, HTN who presents of acute onset shortness of breath. Work-up revealed mildly elevated BNP at 342. CXR with mild congestion. EKG showed LBBB which is new compared to prior. Troponin mildly elevated at 0.04. She was subsequently admitted for management of acute on chronic heart failure exacerbation.   1. Acute on chronic combined heart failure - In 2015 EF was 30-35% which normalized in 2018; echo from admission earlier this year in February with low EF of 25-30%. Her follow-up echo in May of this year showed EF improved to 60-65% - given new LBBB and high risk for CAD, she needs further cardiac work-up to rule out ischemic etiology for acute exacerbation   - will trend trops and repeat EKG in the morning - Patient is Lasix naive. She received IV lasix 40 in the ED. Will continue Lasix 40 mg daily - strict I&Os; daily weights; her weight appears stable from recent clinic visits - repeat echo   2. Uncontrolled DM:  - A1C>14 - continue home Lantus 20 units and SSI with meals   3. HTN:  - continuing Losartan and Coreg - was supposed to  resume amlodipine according to last clinic note, but she has not been taking this; will hold for now in the setting of diuresis  Diet: Heart healthy, carb modified DVT ppx: Lovenox CODE: FULL   Dispo: Admit patient to Inpatient with expected length of stay greater than 2 midnights.  SignedDelice Bison, DO 04/06/2018, 3:49 AM  Pager: [phone]

## 2018-04-06 NOTE — ED Provider Notes (Signed)
[facility] EMERGENCY DEPARTMENT Provider Note   CSN: [id number] Arrival date & time: 04/05/18  2336     History   Chief Complaint Chief Complaint  Patient presents with  . Shortness of Breath    HPI [name] is a 62 y.o. female.  The history is provided by the patient.  Shortness of Breath  This is a new problem. Episode onset: Prior to arrival. The problem has been gradually improving. Associated symptoms include cough, orthopnea and vomiting. Pertinent negatives include no fever, no chest pain and no leg swelling. Treatments tried: Oxygen. The treatment provided moderate relief. Associated medical issues include heart failure.  Patient with history of diabetes, hypertension, CHF presents with shortness of breath.  She reports while lying in bed she had abrupt onset of coughing, choking and then felt short of breath.  No chest pain.  She did have some vomiting associated with cough. First responders noted the patient was hypoxic and she was given oxygen. Patient reports recent orthopnea, also reports recent increase in dyspnea on exertion Past Medical History:  Diagnosis Date  . Arthritis   . CHF (congestive heart failure) ([place])    2014...@ [facility]  . Diabetes mellitus    diagnosed 2001  . Healthcare maintenance 06/16/2015  . Hypertension   . Influenza A 06/01/2017  . Influenza, pneumonia 06/01/2017  . Left flank pain 10/11/2016  . Problem with sexual relationship 10/13/2017  . PVD (peripheral vascular disease) [facility])     Patient Active Problem List   Diagnosis Date Noted  . Nonischemic cardiomyopathy ([place]) 08/31/2017  . Pain in toes of both feet 01/26/2017  . Bilateral carotid artery disease ([place]) 11/10/2016  . Seasonal allergies 06/30/2016  . Skin lesion of foot 03/08/2016  . Financial difficulties 03/08/2016  . Health care maintenance 06/16/2015  . Nonproliferative diabetic retinopathy associated with type 2 diabetes mellitus ([place]) 06/15/2015  .  Osteoarthritis of right hip 06/09/2015  . Arthritis of left hip 08/18/2013  . DM (diabetes mellitus) type II uncontrolled, periph vascular disorder ([place]) 07/17/2013  . Anemia 07/17/2013  . Chronic combined systolic and diastolic CHF, NYHA class 2 ([place]) 07/07/2013  . CKD (chronic kidney disease) 11/22/2012  . HTN (hypertension) 11/08/2012  . Atherosclerosis of native arteries of the extremities with ulceration ([place]) 08/16/2012    Past Surgical History:  Procedure Laterality Date  . ABDOMINAL ANGIOGRAM  12/28/2012  . ABDOMINAL ANGIOGRAM N/A 12/29/2011   Procedure: ABDOMINAL ANGIOGRAM;  Surgeon: Conrad Gordon, MD;  Location[facility] CATH LAB;  Service: Cardiovascular;  Laterality: N/A;  . ABDOMINAL AORTAGRAM N/A 12/21/2012   Procedure: ABDOMINAL Maxcine Ham;  Surgeon: Elam Dutch, MD;  Location: [facility] CATH LAB;  Service: Cardiovascular;  Laterality: N/A;  . TOTAL HIP ARTHROPLASTY Left 08/19/2013   Procedure: TOTAL HIP ARTHROPLASTY;  Surgeon: Kerin Salen, MD;  Location: [facility];  Service: Orthopedics;  Laterality: Left;  . TUBAL LIGATION       OB History   None      Home Medications    Prior to Admission medications   Medication Sig Start Date End Date Taking? Authorizing Provider  amLODipine (NORVASC) 10 MG tablet TAKE 1 TABLET (10 MG TOTAL) BY MOUTH DAILY. 11/22/17   Annia Belt, MD  aspirin EC 81 MG tablet Take 1 tablet (81 mg total) by mouth daily. IM program, hope fund 04/22/16   Norman Herrlich, MD  atorvastatin (LIPITOR) 40 MG tablet Take 1 tablet (40 mg total) by mouth daily. 02/28/18  Kathi Ludwig, MD  blood glucose meter kit and supplies KIT Dispense based on patient and insurance preference. Use up to four times daily as directed. (FOR ICD-9 250.00, 250.01). 02/28/18   Kathi Ludwig, MD  carvedilol (COREG) 12.5 MG tablet TAKE 1 TABLET (12.5 MG TOTAL) BY MOUTH 2 (TWO) TIMES DAILY WITH A MEAL. 02/28/18   Kathi Ludwig, MD  diclofenac sodium (VOLTAREN) 1 %  GEL Apply 2 g topically 4 (four) times daily as needed (pain).    [provider]  Insulin Detemir (LEVEMIR FLEXTOUCH) 100 UNIT/ML Pen Inject 20 Units into the skin at bedtime. 02/28/18   Kathi Ludwig, MD  losartan (COZAAR) 100 MG tablet Take 1 tablet (100 mg total) by mouth daily. 02/28/18   Kathi Ludwig, MD  sitaGLIPtin-metformin (JANUMET) 50-1000 MG tablet Take 1 tablet by mouth 2 (two) times daily with a meal. 02/28/18   Kathi Ludwig, MD    Family History Family History  Problem Relation Age of Onset  . Diabetes Mother     Social History Social History   Tobacco Use  . Smoking status: Never Smoker  . Smokeless tobacco: Never Used  Substance Use Topics  . Alcohol use: No    Alcohol/week: 0.0 standard drinks    Comment: ocassionally  . Drug use: No     Allergies   Lisinopril and Plavix [clopidogrel bisulfate]   Review of Systems Review of Systems  Constitutional: Negative for fever.  Respiratory: Positive for cough and shortness of breath.   Cardiovascular: Positive for orthopnea. Negative for chest pain and leg swelling.  Gastrointestinal: Positive for vomiting.  All other systems reviewed and are negative.    Physical Exam Updated Vital Signs BP (!) 169/85 (BP Location: Left Arm)   Pulse 96   Resp 20   Ht 1.6 m ('5\' 3"' )   Wt 77.1 kg   SpO2 98%   BMI 30.11 kg/m   Physical Exam CONSTITUTIONAL: Well developed/well nourished HEAD: Normocephalic/atraumatic EYES: EOMI/PERRL ENMT: Mucous membranes moist NECK: supple no meningeal signs SPINE/BACK:entire spine nontender CV: S1/S2 noted, no murmurs/rubs/gallops noted LUNGS: Crackles bilaterally ABDOMEN: soft, nontender, no rebound or guarding, bowel sounds noted throughout abdomen GU:no cva tenderness NEURO: Pt is awake/alert/appropriate, moves all extremitiesx4.  No facial droop.   EXTREMITIES: pulses normal/equal, full ROM, no edema or calf tenderness SKIN: warm, color  normal PSYCH: no abnormalities of mood noted, alert and oriented to situation   ED Treatments / Results  Labs (all labs ordered are listed, but only abnormal results are displayed) Labs Reviewed  BASIC METABOLIC PANEL - Abnormal; Notable for the following components:      Result Value   Glucose, Bld 108 (*)    Creatinine, Ser 1.51 (*)    GFR calc non Af Amer 37 (*)    GFR calc Af Amer 43 (*)    All other components within normal limits  CBC WITH DIFFERENTIAL/PLATELET - Abnormal; Notable for the following components:   Hemoglobin 11.4 (*)    All other components within normal limits  BRAIN NATRIURETIC PEPTIDE - Abnormal; Notable for the following components:   B Natriuretic Peptide 342.3 (*)    All other components within normal limits  TROPONIN I - Abnormal; Notable for the following components:   Troponin I 0.04 (*)    All other components within normal limits    EKG EKG Interpretation  Date/Time:  Friday April 06 2018 00:37:48 EST Ventricular Rate:  87 PR Interval:    QRS Duration: 150 QT Interval:  438 QTC Calculation: 527 R Axis:   -15 Text Interpretation:  Sinus rhythm Left bundle branch block Confirmed by Ripley Fraise [phone]) on 04/06/2018 12:43:02 AM   Radiology Dg Chest 2 View  Result Date: 04/06/2018 CLINICAL DATA:  Shortness of breath EXAM: CHEST - 2 VIEW COMPARISON:  06/05/2017 FINDINGS: Cardiomegaly. Scarring in the lingula. Bibasilar atelectasis. No effusions or edema. No acute bony abnormality. IMPRESSION: Cardiomegaly.  Bibasilar atelectasis. Electronically Signed   By: Rolm Baptise M.D.   On: 04/06/2018 00:38    Procedures Procedures  CRITICAL CARE Performed by: Sharyon Cable Total critical care time: 33 minutes Critical care time was exclusive of separately billable procedures and treating other patients. Critical care was necessary to treat or prevent imminent or life-threatening deterioration. Critical care was time spent personally by  me on the following activities: development of treatment plan with patient and/or surrogate as well as nursing, discussions with consultants, evaluation of patient's response to treatment, examination of patient, obtaining history from patient or surrogate, ordering and performing treatments and interventions, ordering and review of laboratory studies, ordering and review of radiographic studies, pulse oximetry and re-evaluation of patient's condition.   Medications Ordered in ED Medications  furosemide (LASIX) injection 40 mg (has no administration in time range)  nitroGLYCERIN (NITROGLYN) 2 % ointment 1 inch (1 inch Topical Given 04/06/18 0039)  aspirin chewable tablet 324 mg (324 mg Oral Given 04/06/18 0228)     Initial Impression / Assessment and Plan / ED Course  I have reviewed the triage vital signs and the nursing notes.  Pertinent labs & imaging results that were available during my care of the patient were reviewed by me and considered in my medical decision making (see chart for details).     2:37 AM Patient presented for abrupt onset of shortness of breath and coughing.  She was hypoxic at home, my initial evaluation she had crackles. Strong suspicion for CHF, patient reports recent increasing dyspnea on exertion. Reports some mild chest tightness earlier, none at this time. X-ray does not reveal overt pulmonary edema, but she does have elevation in BNP.  She also has mild elevation of troponin.  On ambulation she became dyspneic and when sitting down she dropped into the upper 80s on pulse ox At rest she is chest pain-free.  I have low suspicion for PE, suspect early CHF.  She has agreed to be admitted.  Discussed with internal medicine service for admission Final Clinical Impressions(s) / ED Diagnoses   Final diagnoses:  Acute diastolic congestive heart failure ([place])  Non-STEMI (non-ST elevated myocardial infarction) [facility])    ED Discharge Orders    None        Ripley Fraise, MD 04/06/18 [phone]

## 2018-04-07 ENCOUNTER — Inpatient Hospital Stay (HOSPITAL_COMMUNITY): Payer: Medicaid Other

## 2018-04-07 DIAGNOSIS — I351 Nonrheumatic aortic (valve) insufficiency: Secondary | ICD-10-CM

## 2018-04-07 DIAGNOSIS — I340 Nonrheumatic mitral (valve) insufficiency: Secondary | ICD-10-CM

## 2018-04-07 LAB — BASIC METABOLIC PANEL
Anion gap: 11 (ref 5–15)
BUN: 23 mg/dL (ref 8–23)
CO2: 27 mmol/L (ref 22–32)
CREATININE: 1.53 mg/dL — AB (ref 0.44–1.00)
Calcium: 8.6 mg/dL — ABNORMAL LOW (ref 8.9–10.3)
Chloride: 104 mmol/L (ref 98–111)
GFR calc Af Amer: 42 mL/min — ABNORMAL LOW (ref >60)
GFR calc non Af Amer: 36 mL/min — ABNORMAL LOW (ref >60)
Glucose, Bld: 100 mg/dL — ABNORMAL HIGH (ref 70–99)
Potassium: 3.6 mmol/L (ref 3.5–5.1)
Sodium: 142 mmol/L (ref 135–145)

## 2018-04-07 LAB — ECHOCARDIOGRAM COMPLETE
Height: 63 in
Weight: 2721.6 oz

## 2018-04-07 LAB — GLUCOSE, CAPILLARY: Glucose-Capillary: 85 mg/dL (ref 70–99)

## 2018-04-07 MED ORDER — SPIRONOLACTONE 25 MG PO TABS: 12.5000 mg | ORAL_TABLET | Freq: Every day | ORAL | 0 refills | Status: DC

## 2018-04-07 MED ORDER — INSULIN DETEMIR 100 UNIT/ML FLEXPEN: 15.0000 [IU] | PEN_INJECTOR | Freq: Every day | SUBCUTANEOUS | 0 refills | Status: DC

## 2018-04-07 NOTE — Progress Notes (Signed)
   Subjective: [name] says that she is doing much better this morning.  She reports that she continues to have a productive cough but denies any shortness of breath.  Objective:  Vital signs in last 24 hours: Vitals:   04/06/18 1526 04/06/18 2001 04/07/18 0016 04/07/18 0412  BP: (!) 149/76 (!) 155/70 (!) 152/81 (!) 155/79  Pulse: 81 86 81 75  Resp: 18 18 18 18   Temp: 98.6 F (37 C) 98.3 F (36.8 C) 98.8 F (37.1 C) 98.1 F (36.7 C)  TempSrc: Oral Oral Oral Oral  SpO2: 91% 92% 95% 96%  Weight: 77.5 kg   77.2 kg  Height: 5\' 3"  (1.6 m)      Physical Exam  Constitutional: She appears well-developed and well-nourished.  Cardiovascular: Normal rate and normal heart sounds.  Pulmonary/Chest: Effort normal.  CTAB  Musculoskeletal:       Right lower leg: Normal. She exhibits no edema.       Left lower leg: Normal. She exhibits no edema.    Assessment/Plan:  Principal Problem:   Acute respiratory failure with hypoxia (HCC) Active Problems:   Acute on chronic heart failure ([place])  [name] is a 62 y/o female with PMHx combined heart failure, uncontrolled DM, HTN who was admitted with acute hypoxic respiratory failure which has now resolved. Additionally she was found to have an elevated BNP and CXR with mild congestion concerning for acute on chronic heart failure. She was given Lasix upon admission and remains euvolemic this morning. ECHO pending. I suspect that she has a viral respiratory infection which exacerbated her heart failure. PT evaluated her and recommends home health PT. I will order this today. She is medically stable and will be discharged today pending ECHO results.  Acute Hypoxic Respiratory Failure: Resolved 1. Will order PT  Acute on chronic combined heart failure: Appears euvolemic on exam.  1. Recommended that she make an appointment with her Cardiologist within 1 week.  Uncontrolled DM:  1. Will continue home Lantus 20 units at home.  HTN:  1.  Will continue home Losartan and Coreg 2. Will continue Spironolactone (added in the hospital)  Diet: Heart healthy, carb modified DVT ppx: Lovenox CODE: FULL  Dispo: Anticipated discharge today.  Carroll Sage, MD 04/07/2018, 6:44 AM Pager: [phone]

## 2018-04-07 NOTE — Progress Notes (Signed)
  Date: 04/07/2018  Patient name: [name]  Medical record number: [id number]  Date of birth: Dec 11, 1955   I have seen and evaluated this patient and I have discussed the plan of care with the house staff. Please see their note for complete details. I concur with their findings with the following additions/corrections: Agree with discharge to home  Bartholomew Crews, MD 04/07/2018, 11:57 AM

## 2018-04-07 NOTE — Progress Notes (Signed)
OT Cancellation Note and Discharge  Patient Details Name: [name] MRN: [id number] DOB: December 10, 1955   Cancelled Treatment:    Reason Eval/Treat Not Completed: Other (comment). Pt is in W/C getting D/C instructions from RN. PT recommended HHPT--they can determine the need for any OT services. We are signing off.  [place], OTR/L Acute Rehab Services Pager [phone] Office [phone]     Almon Register 04/07/2018, 12:18 PM

## 2018-04-07 NOTE — Progress Notes (Signed)
  Echocardiogram 2D Echocardiogram has been performed.  [name] 04/07/2018, 9:25 AM

## 2018-04-07 NOTE — Progress Notes (Signed)
Patient slept this shift without complaints. BP this am 155/79, HR=75, afebrile, o2 sat 96% on RA. 2200 accuchek 305mg /dl, patient did eat a banana prior to check.

## 2018-04-07 NOTE — Care Management Note (Signed)
Case Management Note  Patient Details  Name: [name] MRN: [id number] Date of Birth: 09-05-1955  Subjective/Objective:                    Action/Plan:  Spoke w patient and spouse at bedside. They would like outpatient PT. Notified MD referral made to OP PT. No other CM needs identified.  Expected Discharge Date:  04/07/18               Expected Discharge Plan:  Home/Self Care  In-House Referral:     Discharge planning Services  CM Consult  Post Acute Care Choice:    Choice offered to:     DME Arranged:    DME Agency:     HH Arranged:    HH Agency:     Status of Service:  Completed, signed off  If discussed at [employer] of Stay Meetings, dates discussed:    Additional Comments:  Carles Collet, RN 04/07/2018, 10:33 AM

## 2018-04-07 NOTE — Discharge Summary (Addendum)
Name: [name] MRN: [id number] DOB: [birth year] 62 y.o. PCP: Marty Heck, DO  Date of Admission: 04/05/2018 11:36 PM Date of Discharge: 04/07/2018 Attending Physician: Larey Dresser MD  Discharge Diagnosis: 1. Acute Hypoxic Respiratory Failure 2. Acute on Chronic Combined Congestive Heart Failure 3. Uncontrolled Type II Diabetes 4. Hypertension  Discharge Medications: Allergies as of 04/07/2018      Reactions   Lisinopril    Cough    Plavix [clopidogrel Bisulfate] Rash      Medication List    STOP taking these medications   insulin glargine 100 unit/mL Sopn Commonly known as:  LANTUS     TAKE these medications   amLODipine 10 MG tablet Commonly known as:  NORVASC TAKE 1 TABLET (10 MG TOTAL) BY MOUTH DAILY.   aspirin EC 81 MG tablet Take 1 tablet (81 mg total) by mouth daily. IM program, [facility]   atorvastatin 40 MG tablet Commonly known as:  LIPITOR Take 1 tablet (40 mg total) by mouth daily.   blood glucose meter kit and supplies Kit Dispense based on patient and insurance preference. Use up to four times daily as directed. (FOR ICD-9 250.00, 250.01).   carvedilol 12.5 MG tablet Commonly known as:  COREG TAKE 1 TABLET (12.5 MG TOTAL) BY MOUTH 2 (TWO) TIMES DAILY WITH A MEAL. What changed:    how much to take  how to take this  when to take this  additional instructions   diclofenac sodium 1 % Gel Commonly known as:  VOLTAREN Apply 2 g topically 4 (four) times daily as needed (pain).   Insulin Detemir 100 UNIT/ML Pen Commonly known as:  LEVEMIR Inject 15 Units into the skin at bedtime. What changed:  how much to take   losartan 100 MG tablet Commonly known as:  COZAAR Take 1 tablet (100 mg total) by mouth daily.   sitaGLIPtin-metformin 50-1000 MG tablet Commonly known as:  JANUMET Take 1 tablet by mouth 2 (two) times daily with a meal.   spironolactone 25 MG tablet Commonly known as:  ALDACTONE Take 0.5 tablets (12.5 mg  total) by mouth daily.       Disposition and follow-up:   [name] was discharged from [facility] in stable condition.  At the hospital follow up visit please address:  1.  Please follow-up volume status and need for PRN vs schedule Lasix. Please assess medication compliance (especially cardiovascular and glycemic control medications).  2.  Labs / imaging needed at time of follow-up: None  3.  Pending labs/ test needing follow-up: None  Follow-up Appointments: Follow-up Information    [name], MD. Schedule an appointment as soon as possible for a visit.   Specialty:  Cardiology Why:  Please make an appointment within 1 week. Contact information: [address] [place] [address] [phone]        [name], DO. Schedule an appointment as soon as possible for a visit.   Specialty:  Internal Medicine Why:  For hospital follow-up. Contact information: [address] [address] [place] Hospital Course by problem list: 1. Acute Hypoxic Respiratory Failure: [name] presented with acute onset shortness of breath and required supplemental oxygen. She was found to have a CXR with mild congestion as well as mildly elevated BNP at 342. EKG showed new left bundle branch block. Troponin was mildly elevated at 0.04. She was given  IV Lasix 40 mg with significant improvement in her shortness of breath. Further questioning revealed that she had multiple family members with upper respiratory infection symptoms and she too had a productive cough with rhinorrhea. Her acute hypoxic respiratory failure was likely due to an acute viral respiratory infection and exacerbation of heart failure. She continued to do well without further Lasix treatment. She was evaluated by PT who recommended home health PT. This was ordered and she was discharged in stable condition. She has close follow-up with both PCP and  cardiology.    2. Acute on Chronic Combined Congestive Heart Failure: Repeat ECHO showed significantly reduced EF at 30-35 % as compared to ECHO in 08/2017. The new ECHO findings were however very similar to the ECHO performed in 06/2017. She had mild congestion on CXR and she improved significantly with a one time dose of Lasix 40 mg. She will follow-up with cardiology as an outpatient for further management.   3. Uncontrolled Type II Diabetes: Home-regiment includes Lantus 20 units and SSI. Her A1c was however found to be >14 suggesting that she has been noncompliant. I refilled her Lantus and stressed the importance of consistently taking her Lantus everyday.  4. Hypertension: Her home medications Losartan and Coreg was continued throughout her admission. Blood pressures remained within normal limits. These medications were continued at discharge.   Discharge Vitals:   BP (!) 158/75   Pulse 79   Temp 98.3 F (36.8 C) (Oral)   Resp 18   Ht '5\' 3"'  (1.6 m)   Wt 77.2 kg   SpO2 97%   BMI 30.13 kg/m   Pertinent Labs, Studies, and Procedures:  BNP    Component Value Date/Time   BNP 342.3 (H) 04/06/2018 0004   04/07/18 ECHO: Study Conclusions  - Left ventricle: The cavity size was normal. There was moderate   concentric hypertrophy. Systolic function was moderately to   severely reduced. The estimated ejection fraction was in the   range of 30% to 35%. Diffuse hypokinesis worse in the inferior   myocardium. Doppler parameters are consistent with abnormal left   ventricular relaxation (grade 1 diastolic dysfunction). Doppler   parameters are consistent with high ventricular filling pressure. - Aortic valve: Transvalvular velocity was within the normal range.   There was no stenosis. There was mild regurgitation. - Mitral valve: Calcified annulus. Mobility of the posterior   leaflet was restricted. Transvalvular velocity was within the   normal range. There was no evidence for stenosis.  There was mild   regurgitation. Valve area by continuity equation (using LVOT   flow): 1.34 cm^2. - Left atrium: The atrium was severely dilated. - Right ventricle: The cavity size was normal. Wall thickness was   normal. Systolic function was normal. - Tricuspid valve: There was trivial regurgitation. - Pulmonary arteries: Systolic pressure was within the normal   range. PA peak pressure: 16 mm Hg (S). Impressions: - Compared with the echo 0/3212 systolic function is significantly   reduced.  Signed: Carroll Sage, MD 04/07/2018, 9:38 AM   Pager: [phone]

## 2018-04-09 ENCOUNTER — Ambulatory Visit: Payer: Self-pay | Attending: Family Medicine | Admitting: Podiatry

## 2018-04-09 DIAGNOSIS — B351 Tinea unguium: Secondary | ICD-10-CM

## 2018-04-09 DIAGNOSIS — M79675 Pain in left toe(s): Secondary | ICD-10-CM

## 2018-04-09 DIAGNOSIS — M79674 Pain in right toe(s): Secondary | ICD-10-CM

## 2018-04-09 MED FILL — LEVEMIR FLEXTOUCH 100 UNITS: 100 | 20 days supply | Qty: 3 | Fill #0

## 2018-04-09 MED FILL — SPIRONOLACTONE 25 MG TABLET: 25 | 30 days supply | Qty: 15 | Fill #0

## 2018-04-10 ENCOUNTER — Ambulatory Visit
Payer: Self-pay | Attending: Student in an Organized Health Care Education/Training Program | Admitting: Physical Therapy

## 2018-04-10 NOTE — Progress Notes (Signed)
   CC: hospital follow-up  HPI:  [name] is a 62 y.o. with PMH as below presenting for hospital follow-up after being admitted for acute hypoxic respiratory failure 2/2 volume overload and acute upper respiratory illness with known sick contacts.  Please see A&P for assessment of the patient's acute and chronic medical conditions.    Past Medical History:  Diagnosis Date  . Arthritis   . CHF (congestive heart failure) ([place])    2014...@ [facility]  . Diabetes mellitus    diagnosed 2001  . Healthcare maintenance 06/16/2015  . Hypertension   . Influenza A 06/01/2017  . Influenza, pneumonia 06/01/2017  . Left flank pain 10/11/2016  . Problem with sexual relationship 10/13/2017  . PVD (peripheral vascular disease) ([place])    Review of Systems:   Review of Systems  Constitutional: Negative for diaphoresis and weight loss.  Respiratory: Positive for cough, sputum production and shortness of breath. Negative for hemoptysis and wheezing.   Cardiovascular: Negative for chest pain, palpitations and leg swelling.  Gastrointestinal: Negative for abdominal pain, heartburn and nausea.  Genitourinary: Negative for dysuria and frequency (no decrease in urination).  Musculoskeletal: Positive for joint pain. Negative for falls.  Neurological: Negative for dizziness.  Psychiatric/Behavioral: Positive for depression. Negative for suicidal ideas. The patient is not nervous/anxious.    Physical Exam:  Constitution: NAD, well developed Cardio: RRR, no m/r/g Respiratory: CTAB, non-labored breathing, no wheezing, rales, rhonchi Abdominal: soft, non-distended MSK: no pitting edema, warm extremities    Vitals:   04/11/18 1411 04/11/18 1449  BP: (!) 170/71 (!) 152/59  Pulse: 85   Temp: (!) 97.5 F (36.4 C)   TempSrc: Oral   SpO2: 100%   Weight: 172 lb 3.2 oz (78.1 kg)   Height: 5\' 3"  (1.6 m)      Assessment & Plan:   See Encounters Tab for problem based charting.  Patient seen with Dr.  Lynnae January

## 2018-04-11 ENCOUNTER — Encounter: Payer: Self-pay | Admitting: Internal Medicine

## 2018-04-11 ENCOUNTER — Ambulatory Visit (INDEPENDENT_AMBULATORY_CARE_PROVIDER_SITE_OTHER): Payer: Self-pay | Admitting: Internal Medicine

## 2018-04-11 ENCOUNTER — Other Ambulatory Visit: Payer: Self-pay

## 2018-04-11 VITALS — BP 152/59 | HR 85 | Temp 97.5°F | Ht 63.0 in | Wt 172.2 lb

## 2018-04-11 DIAGNOSIS — I130 Hypertensive heart and chronic kidney disease with heart failure and stage 1 through stage 4 chronic kidney disease, or unspecified chronic kidney disease: Secondary | ICD-10-CM

## 2018-04-11 DIAGNOSIS — F320 Major depressive disorder, single episode, mild: Secondary | ICD-10-CM

## 2018-04-11 DIAGNOSIS — I10 Essential (primary) hypertension: Secondary | ICD-10-CM

## 2018-04-11 DIAGNOSIS — E1165 Type 2 diabetes mellitus with hyperglycemia: Secondary | ICD-10-CM

## 2018-04-11 DIAGNOSIS — E1122 Type 2 diabetes mellitus with diabetic chronic kidney disease: Secondary | ICD-10-CM

## 2018-04-11 DIAGNOSIS — I5042 Chronic combined systolic (congestive) and diastolic (congestive) heart failure: Secondary | ICD-10-CM

## 2018-04-11 DIAGNOSIS — N189 Chronic kidney disease, unspecified: Secondary | ICD-10-CM

## 2018-04-11 DIAGNOSIS — F32A Depression, unspecified: Secondary | ICD-10-CM | POA: Insufficient documentation

## 2018-04-11 DIAGNOSIS — N182 Chronic kidney disease, stage 2 (mild): Secondary | ICD-10-CM

## 2018-04-11 DIAGNOSIS — IMO0002 Reserved for concepts with insufficient information to code with codable children: Secondary | ICD-10-CM

## 2018-04-11 DIAGNOSIS — M1611 Unilateral primary osteoarthritis, right hip: Secondary | ICD-10-CM

## 2018-04-11 DIAGNOSIS — Z0000 Encounter for general adult medical examination without abnormal findings: Secondary | ICD-10-CM

## 2018-04-11 DIAGNOSIS — Z79899 Other long term (current) drug therapy: Secondary | ICD-10-CM

## 2018-04-11 DIAGNOSIS — E1151 Type 2 diabetes mellitus with diabetic peripheral angiopathy without gangrene: Secondary | ICD-10-CM

## 2018-04-11 DIAGNOSIS — Z794 Long term (current) use of insulin: Secondary | ICD-10-CM

## 2018-04-11 HISTORY — DX: Major depressive disorder, single episode, mild: F32.0

## 2018-04-11 HISTORY — DX: Depression, unspecified: F32.A

## 2018-04-11 NOTE — Assessment & Plan Note (Signed)
Discharged 12/07 for acute heart failure exacerbation and hypoxic respiratory failure with volume overload. Echo done 12/7 showed EF 30-35% with grade 1 diastolic dysfunction. She was discharged with spironolactone 12.5 mg qd and her regular BP medications, losartan, amlodipine 10 mg qd, and coreg 12.5 mg bid. She obtained her spironolactone on Monday as the pharmacy was closed over the weekend. She states her SOB has improved but she continues to have SOB on exertion that is increased from nml prior to hospitalization. O2 saturation 100% after walking to clinic from her car. Her cough has also improved but is still present, and she is coughing up some white phlegm. She has an appointment with cardiology on 12/17. On PE no LE edema, no JVD, but she states she carries most extra fluid on her abdomen. She states this has decreased since admission to the hospital.   - cont. Spironolactone 12.5 mg qd. Will need >5 days before we can determine efficacy on blood pressure. No signs of fluid overload at this time on physical exam. - discussed baseline weight appears to be around 165 lbs. Current 172. She agrees to weigh herself daily and understands return precautions if she becomes SOB or gains more than 5lbs within 24 hr. Period.  - f/u in two weeks for blood pressure check and to determine if she needs increase in spironolactone. Blood pressure goal as low as tolerated as she has HF with EF 35% - bmp today - cont. Losartan, coreg, and amlodipine

## 2018-04-11 NOTE — Patient Instructions (Addendum)
Thank you for allowing [place] to provide your care today. Today we discussed your congestive heart failure, hypertension, and type II diabetes.   I have ordered the following labs for you: basic metabolic panel   I will call if any are abnormal.    Please continue to take your medications as prescribed when you left the hospital.   Please also weight yourself every morning. Your baseline weight should be 165lbs, and we will see if you return to baseline as you continue to decrease fluid overload. If you gain more than 5lbs in 24 hours or become short of breath, please call the office.    I have sent a referral for colonoscopy.   You will also need to follow-up using the number on your [facility] card to determine the status of your podiatry and opthalmology appointment.   For your physical therapy, please check your mail to see if you received your CAFA letter. If you have not, please call the office to see if you can obtain this from the financial office here.   Please follow-up in two weeks.    Should you have any questions or concerns please call the internal medicine clinic at [phone].

## 2018-04-11 NOTE — Assessment & Plan Note (Signed)
PHQ-9 8. She states she feels depressed due to her hip pain and recent hospitalization and SOB. She wants to be able to play with her grandkids but finds she tires out quickly. She does not have SI or a desire to hurt herself and is hopeful that her new medications and returning to PT will help her improve physically so that she can do the things she wants to do.   - no medication at this time, will work on treating underlying conditions with physical therapy and recovery from recent hospitalization.

## 2018-04-11 NOTE — Assessment & Plan Note (Signed)
BP Readings from Last 3 Encounters:  04/11/18 (!) 152/59  04/07/18 (!) 158/75  03/14/18 (!) 169/71    She is taking coreg, amlodipine and losartan. BP today elevated, but she only started spironolactone 12.5 mg qd two days ago. Will need >5 days for medication to take effect and will plan to increase to 25mg  qd if not controlled.   - f/u two weeks  - increase spironolactone to 25 mg if bp continues to be elevated

## 2018-04-11 NOTE — Assessment & Plan Note (Signed)
History of CKD with baseline previously around 1.34. Cr at hospital discharge 1.53. During previous heart failure exacerbations her Cr returned to baseline with diuresis. She also was previously without her medications for blood pressure and diabetes for four months but has been able to start these medications again. Will repeat BMP today and monitor. See CHF and TIIDM for further plan.   - obtain BMP

## 2018-04-11 NOTE — Assessment & Plan Note (Signed)
-   colonoscopy referral placed   - Declines PAP today but states she will do this at next appointment.

## 2018-04-11 NOTE — Assessment & Plan Note (Signed)
States she continues to have some pain with her right hip and is hoping to return to PT. She has not been able to go back due to not having her CAFA letter. This was renewed in October and she should have received a letter in the mail.   - She will check for letter and contact the office if she cannot find it.

## 2018-04-11 NOTE — Assessment & Plan Note (Addendum)
Recommended to assess medication compliance on hospital follow-up. Her medications include Lantus 20U and Janumet. Recent changed to Levemir 15U. She will finish off her Lantus at 15U before switching to levemir. She checks her glucose every am, and this has been running around 110. Previously was off her medications ~4 months as she did not have her orange card anymore, and HgbA1c had elevated to >14. She states she has been compliaent with her medications since getting her orange card again, and will continue to check glucose as well. No has not had any symptoms of hypoglycemia.   - f/u end of January for HgbA1c check - should be taking a statin - will obtain lipid panel and start at f/u as she has had a lot of medication changes recently.  - Opthamologist appointment pending - advised her to call number on back of orange card.  - cont. janumet 50-1000 mg bid and lantus 15U qhs then switch to levemir qhs

## 2018-04-12 LAB — BMP8+ANION GAP
Anion Gap: 17 mmol/L (ref 10.0–18.0)
BUN/Creatinine Ratio: 18 (ref 12–28)
BUN: 26 mg/dL (ref 8–27)
CO2: 20 mmol/L (ref 20–29)
Calcium: 9.9 mg/dL (ref 8.7–10.3)
Chloride: 105 mmol/L (ref 96–106)
Creatinine, Ser: 1.43 mg/dL — ABNORMAL HIGH (ref 0.57–1.00)
GFR calc Af Amer: 46 mL/min/{1.73_m2} — ABNORMAL LOW (ref >59)
GFR calc non Af Amer: 40 mL/min/{1.73_m2} — ABNORMAL LOW (ref >59)
Glucose: 99 mg/dL (ref 65–99)
Potassium: 4.7 mmol/L (ref 3.5–5.2)
Sodium: 142 mmol/L (ref 134–144)

## 2018-04-12 MED FILL — JANUMET 50-1,000 MG TABLET: 50-1000 | 30 days supply | Qty: 60 | Fill #1

## 2018-04-12 NOTE — Progress Notes (Signed)
Internal Medicine Clinic Attending  I saw and evaluated the patient.  I personally confirmed the key portions of the history and exam documented by Dr. Seawell and I reviewed pertinent patient test results.  The assessment, diagnosis, and plan were formulated together and I agree with the documentation in the resident's note.     

## 2018-04-12 NOTE — Progress Notes (Signed)
Subjective: 62 [facility].o. [facility] painful, elongated, thickened toenails which they cannot trim themself. Denies any redness or drainage around the nails. Denies any acute changes since last appointment and no new complaints today. Denies any systemic complaints such as fevers, chills, nausea, vomiting.   Patient got discharged in the hospital she states that her breathing is much improved.  PCP: Melanee Spry, MD   Objective: AAO 3, NAD Neurovascular status unchanged Nails hypertrophic, dystrophic, elongated, brittle, discolored 10. There is tenderness overlying the nails 1-5 bilaterally. There is no surrounding erythema or drainage along the nail sites. No open lesions or pre-ulcerative lesions are identified. No pain with calf compression, swelling, warmth, erythema.  Assessment: Patient presents with symptomatic onychomycosis  Plan: -Treatment options including alternatives, risks, complications were discussed -Nails sharply debrided 10 without complication/bleeding. -Discussed daily foot inspection. If there are any changes, to call the office immediately.  -Follow-up in 3 months or sooner if any problems are to arise. In the meantime, encouraged to call the office with any questions, concerns, changes symptoms.  Celesta Gentile, DPM

## 2018-04-16 NOTE — Progress Notes (Signed)
Cardiology Office Note   Date:  04/17/2018   ID:  RYELLE RUVALCABA, DOB July 09, 1955, MRN [id number]  PCP:  Marty Heck, DO  Cardiologist:  Dr. Tamala Julian    Chief Complaint  Patient presents with  . Hospitalization Follow-up      History of Present Illness: [name] is a 62 y.o. female who presents for post hospitalization for acute systolic HF and Copd exacerbation.   He has a hx of HTN, DM, PVD, chronic diastolic heart failure, prior history of low EF in 2015 of 30-35%, improved EF in 2018, and history of abnormal NST but no hx of cardiac cath. She was admitted 06/01/17-06/05/17 for influenza A. Echo that admission showed reduced LVEF of 25-30%. He also had chest pain prior to discharge and was restarted on a lower dose of losartan 50 mg (was on 100 mg). Her discharge weight was 167 lbs. Nuclear stress test on 10/2016 without ischemia.    Admitted 12/5 and discharged 12/7 with acute hypoxic resp failure with acute HF    Echo with EF 30-35%, diffuse hypokinesis in inf myocardium.  G1DD.  LA severely dilated.  EKG with LBBB new and troponins flat 0.05  Echo 08/2017 with EF 60-65% G2DD. LA mildly dilated.  She did have EF at 30% in 2015.    Since d/c she is feeling better.  She had no lower ext edema but increased abd. Girth -  She was having to prop herself up at night to sleep.  Now she does not .  In the hospital she did have some chest pressure.  But none at home.   Past Medical History:  Diagnosis Date  . Arthritis   . Arthritis of left hip 08/18/2013   S/p total hip arthroplasty.    . CHF (congestive heart failure) ([place])    2014...@ [facility]  . Diabetes mellitus    diagnosed 2001  . Healthcare maintenance 06/16/2015  . Hypertension   . Influenza A 06/01/2017  . Influenza, pneumonia 06/01/2017  . Left flank pain 10/11/2016  . Problem with sexual relationship 10/13/2017  . PVD (peripheral vascular disease) ([place])     Past Surgical History:  Procedure Laterality Date  .  ABDOMINAL ANGIOGRAM  12/28/2012  . ABDOMINAL ANGIOGRAM N/A 12/29/2011   Procedure: ABDOMINAL ANGIOGRAM;  Surgeon: Conrad Amado, MD;  Location: [facility] CATH LAB;  Service: Cardiovascular;  Laterality: N/A;  . ABDOMINAL AORTAGRAM N/A 12/21/2012   Procedure: ABDOMINAL Maxcine Ham;  Surgeon: Elam Dutch, MD;  Location: [facility] CATH LAB;  Service: Cardiovascular;  Laterality: N/A;  . TOTAL HIP ARTHROPLASTY Left 08/19/2013   Procedure: TOTAL HIP ARTHROPLASTY;  Surgeon: Kerin Salen, MD;  Location: [place];  Service: Orthopedics;  Laterality: Left;  . TUBAL LIGATION       Current Outpatient Medications  Medication Sig Dispense Refill  . amLODipine (NORVASC) 10 MG tablet TAKE 1 TABLET (10 MG TOTAL) BY MOUTH DAILY. 90 tablet 0  . aspirin EC 81 MG tablet Take 1 tablet (81 mg total) by mouth daily. IM program, hope fund 30 tablet 11  . atorvastatin (LIPITOR) 40 MG tablet Take 1 tablet (40 mg total) by mouth daily. 90 tablet 0  . blood glucose meter kit and supplies KIT Dispense based on patient and insurance preference. Use up to four times daily as directed. (FOR ICD-9 250.00, 250.01). 1 each 0  . diclofenac sodium (VOLTAREN) 1 % GEL Apply 2 g topically 4 (four) times daily as needed (pain).    [place]  Insulin Detemir (LEVEMIR FLEXTOUCH) 100 UNIT/ML Pen Inject 15 Units into the skin at bedtime. 15 mL 0  . losartan (COZAAR) 100 MG tablet Take 1 tablet (100 mg total) by mouth daily. 30 tablet 3  . sitaGLIPtin-metformin (JANUMET) 50-1000 MG tablet Take 1 tablet by mouth 2 (two) times daily with a meal. 60 tablet 6  . spironolactone (ALDACTONE) 25 MG tablet Take 0.5 tablets (12.5 mg total) by mouth daily. 15 tablet 0  . carvedilol (COREG) 25 MG tablet Take 1 tablet (25 mg total) by mouth 2 (two) times daily. 180 tablet 3   No current facility-administered medications for this visit.     Allergies:   Lisinopril and Plavix [clopidogrel bisulfate]    Social History:  The patient  reports that she has never smoked. She  has never used smokeless tobacco. She reports that she does not drink alcohol or use drugs.   Family History:  The patient's family history includes Diabetes in her mother.    ROS:  General:no colds or fevers, no weight changes Skin:no rashes or ulcers HEENT:no blurred vision, no congestion CV:see HPI PUL:see HPI GI:no diarrhea constipation or melena, no indigestion GU:no hematuria, no dysuria MS:no joint pain, no claudication Neuro:no syncope, no lightheadedness Endo:+ diabetes, no thyroid disease  Wt Readings from Last 3 Encounters:  04/17/18 170 lb 12.8 oz (77.5 kg)  04/11/18 172 lb 3.2 oz (78.1 kg)  04/07/18 170 lb 1.6 oz (77.2 kg)     PHYSICAL EXAM: VS:  BP (!) 146/78   Pulse 86   Ht '5\' 3"'$  (1.6 m)   Wt 170 lb 12.8 oz (77.5 kg)   SpO2 98%   BMI 30.26 kg/m  , BMI Body mass index is 30.26 kg/m. General:Pleasant affect, NAD Skin:Warm and dry, brisk capillary refill HEENT:normocephalic, sclera clear, mucus membranes moist Neck:supple, no JVD, no bruits  Heart:S1S2 RRR without murmur, gallup, rub or click Lungs:clear without rales, rhonchi, or wheezes SWN:IOEV, non tender, + BS, do not palpate liver spleen or masses Ext:no lower ext edema, 2+ pedal pulses, 2+ radial pulses Neuro:alert and oriented X 3, MAE, follows commands, + facial symmetry    EKG:  EKG is NOT ordered today. In hospital new LBBB SR   Recent Labs: 08/31/2017: ALT 14 04/06/2018: B Natriuretic Peptide 342.3; Hemoglobin 11.4; Platelets 236 04/11/2018: BUN 26; Creatinine, Ser 1.43; Potassium 4.7; Sodium 142    Lipid Panel    Component Value Date/Time   CHOL 149 08/31/2017 0941   TRIG 74 08/31/2017 0941   HDL 60 08/31/2017 0941   CHOLHDL 2.5 08/31/2017 0941   CHOLHDL 3.7 11/08/2012 1331   VLDL 19 11/08/2012 1331   LDLCALC 74 08/31/2017 0941       Other studies Reviewed: Additional studies/ records that were reviewed today include: Echo 04/07/18 Study Conclusions  - Left ventricle: The  cavity size was normal. There was moderate   concentric hypertrophy. Systolic function was moderately to   severely reduced. The estimated ejection fraction was in the   range of 30% to 35%. Diffuse hypokinesis worse in the inferior   myocardium. Doppler parameters are consistent with abnormal left   ventricular relaxation (grade 1 diastolic dysfunction). Doppler   parameters are consistent with high ventricular filling pressure. - Aortic valve: Transvalvular velocity was within the normal range.   There was no stenosis. There was mild regurgitation. - Mitral valve: Calcified annulus. Mobility of the posterior   leaflet was restricted. Transvalvular velocity was within the   normal range. There  was no evidence for stenosis. There was mild   regurgitation. Valve area by continuity equation (using LVOT   flow): 1.34 cm^2. - Left atrium: The atrium was severely dilated. - Right ventricle: The cavity size was normal. Wall thickness was   normal. Systolic function was normal. - Tricuspid valve: There was trivial regurgitation. - Pulmonary arteries: Systolic pressure was within the normal   range. PA peak pressure: 16 mm Hg (S).  Impressions:  - Compared with the echo 12/9371 systolic function is significantly   reduced.   ASSESSMENT AND PLAN:  1.  Acute systolic HF with EF 42% --has been low in 2015.  Pt diuresed now stable.  Not on lasix but on spirolactone.  Will check BMP today.  May need cardiac cath with chest pain.  Will check with Dr. Tamala Julian + cardiac risk factors of DM, HTN - if cath will need to hold ARB due to mildly elevated Cr.    2.  HTN will increase coreg. Follow up in 2-3 weeks.  3.  Cardiomyopathy possible cath. Discussed with pt and her husband The patient understands that risks included but are not limited to stroke (1 in 1000), death (1 in 33), kidney failure [usually temporary] (1 in 500), bleeding (1 in 200), allergic reaction [possibly serious] (1 in 200). Will  obtain Dr. Smith/s input.  Pt on ARB once she has insurance straightened out will change to entresto  4.  LBBB  5.  DM-2 per PCp  .  Current medicines are reviewed with the patient today.  The patient Has no concerns regarding medicines.  The following changes have been made:  See above Labs/ tests ordered today include:see above  Disposition:   FU:  see above  Signed, Cecilie Kicks, NP  04/17/2018 11:36 PM    [place], [place], [place], [place] Phone: [phone]; Fax: [phone]

## 2018-04-17 ENCOUNTER — Ambulatory Visit (INDEPENDENT_AMBULATORY_CARE_PROVIDER_SITE_OTHER): Payer: Self-pay | Admitting: Cardiology

## 2018-04-17 ENCOUNTER — Encounter: Payer: Self-pay | Admitting: Cardiology

## 2018-04-17 ENCOUNTER — Telehealth: Payer: Self-pay | Admitting: Cardiology

## 2018-04-17 VITALS — BP 146/78 | HR 86 | Ht 63.0 in | Wt 170.8 lb

## 2018-04-17 DIAGNOSIS — Z79899 Other long term (current) drug therapy: Secondary | ICD-10-CM | POA: Diagnosis not present

## 2018-04-17 DIAGNOSIS — I428 Other cardiomyopathies: Secondary | ICD-10-CM

## 2018-04-17 DIAGNOSIS — IMO0002 Reserved for concepts with insufficient information to code with codable children: Secondary | ICD-10-CM

## 2018-04-17 DIAGNOSIS — I447 Left bundle-branch block, unspecified: Secondary | ICD-10-CM

## 2018-04-17 DIAGNOSIS — I10 Essential (primary) hypertension: Secondary | ICD-10-CM

## 2018-04-17 DIAGNOSIS — E1151 Type 2 diabetes mellitus with diabetic peripheral angiopathy without gangrene: Secondary | ICD-10-CM

## 2018-04-17 DIAGNOSIS — I5021 Acute systolic (congestive) heart failure: Secondary | ICD-10-CM

## 2018-04-17 DIAGNOSIS — E1165 Type 2 diabetes mellitus with hyperglycemia: Secondary | ICD-10-CM

## 2018-04-17 MED ORDER — CARVEDILOL 25 MG PO TABS: 25.0000 mg | ORAL_TABLET | Freq: Every day | ORAL | 3 refills | Status: DC

## 2018-04-17 MED ORDER — CARVEDILOL 25 MG PO TABS: 25.0000 mg | ORAL_TABLET | Freq: Two times a day (BID) | ORAL | 3 refills | Status: DC

## 2018-04-17 MED FILL — CARVEDILOL 25 MG TABLET: 25 | 30 days supply | Qty: 60 | Fill #0

## 2018-04-17 NOTE — Telephone Encounter (Signed)
° ° °  Pt c/o medication issue:  1. Name of Medication: carvedilol (COREG) 25 MG tablet  2. How are you currently taking this medication (dosage and times per day)?   3. Are you having a reaction (difficulty breathing--STAT)? no  4. What is your medication issue? [facility] outpt pharm calling for clarification

## 2018-04-17 NOTE — Patient Instructions (Addendum)
Medication Instructions:  INCREASE: Carvedilol (Coreg) to 25 mg twice a day   If you need a refill on your cardiac medications before your next appointment, please call your pharmacy.   Lab work: TODAY: BMET  If you have labs (blood work) drawn today and your tests are completely normal, you will receive your results only by: Marland Kitchen MyChart Message (if you have MyChart) OR . A paper copy in the mail If you have any lab test that is abnormal or we need to change your treatment, we will call you to review the results.  Testing/Procedures: None  Follow-Up: Follow up with Cecilie Kicks, NP on 05/10/18 @ 9:00 AM   Any Other Special Instructions Will Be Listed Below (If Applicable).

## 2018-04-18 ENCOUNTER — Telehealth: Payer: Self-pay | Admitting: *Deleted

## 2018-04-18 ENCOUNTER — Encounter: Payer: Self-pay | Admitting: *Deleted

## 2018-04-18 LAB — BASIC METABOLIC PANEL
BUN/Creatinine Ratio: 19 (ref 12–28)
BUN: 33 mg/dL — ABNORMAL HIGH (ref 8–27)
CHLORIDE: 108 mmol/L — AB (ref 96–106)
CO2: 18 mmol/L — ABNORMAL LOW (ref 20–29)
Calcium: 9.3 mg/dL (ref 8.7–10.3)
Creatinine, Ser: 1.72 mg/dL — ABNORMAL HIGH (ref 0.57–1.00)
GFR calc Af Amer: 36 mL/min/{1.73_m2} — ABNORMAL LOW (ref >59)
GFR calc non Af Amer: 32 mL/min/{1.73_m2} — ABNORMAL LOW (ref >59)
Glucose: 102 mg/dL — ABNORMAL HIGH (ref 65–99)
Potassium: 4.3 mmol/L (ref 3.5–5.2)
Sodium: 143 mmol/L (ref 134–144)

## 2018-04-18 NOTE — Telephone Encounter (Signed)
-----   Message from Charlie Pitter, [place] sent at 04/18/2018 12:57 PM EST ----- Covering Laura's inbox for today. She was admitted with resp issues and also felt to have possible CHF, improved with 1 dose of Lasix. Not sent home on any. It appears patient's volume status was felt stable yesterday and weight was stable. Spironolactone had recently been started. BP elevated at yesterday's visit and Mickel Baas increased carvedilol to address. BUN and Cr suggest patient may be on drier side. Would suggest holding spironolactone for now until Mickel Baas can review further on her return (she is back this weekend). Monitor weight daily and call if goes up by 3 lbs or any recurrent symptoms of heart failure.  Dayna Dunn PA-C

## 2018-04-19 ENCOUNTER — Telehealth: Payer: Self-pay | Admitting: Internal Medicine

## 2018-04-20 NOTE — Progress Notes (Signed)
Cath makes sense. Stable currently, so can be done after [facility]

## 2018-04-20 NOTE — H&P (View-Only) (Signed)
Cath makes sense. Stable currently, so can be done after [facility]

## 2018-04-23 ENCOUNTER — Telehealth: Payer: Self-pay | Admitting: *Deleted

## 2018-04-23 ENCOUNTER — Encounter: Payer: Self-pay | Admitting: Gastroenterology

## 2018-04-23 DIAGNOSIS — I428 Other cardiomyopathies: Secondary | ICD-10-CM

## 2018-04-23 DIAGNOSIS — R079 Chest pain, unspecified: Secondary | ICD-10-CM

## 2018-04-23 NOTE — Telephone Encounter (Signed)
Pt returned my call and she is interested in going ahead and setting up her Left & Right Heart Cath, that she has already been explained the risks/benefits and she understands  Will get everything arranged and call pt with instructions:    @[facility] @ [place], [place] [address] Dept: [place]: [place]  04/23/2018  You are scheduled for a Cardiac Catheterization on Monday, December 30 with Dr. Daneen Schick.  1. Please arrive at the [facility] (Main Entrance A) at [facility]: [address] [place], [place] [address] at 7:00 AM (This time is two hours before your procedure to ensure your preparation). Free valet parking service is available.   Special note: Every effort is made to have your procedure done on time. Please understand that emergencies sometimes delay scheduled procedures.  2. Diet: Do not eat solid foods after midnight.  The patient may have clear liquids until 5am upon the day of the procedure.  3. Labs: You will need to have blood drawn on Tuesday, December 24  at [facility] at [facility]. [address]  Open: 7:30am - 5pm    Phone: [phone]. You do not need to be fasting.  4. Medication instructions in preparation for your procedure:   Contrast Allergy: No   Take only 1/2 Dose of your insulin on the night before your procedure.  DO NOT TAKE ANY INSULIN on the day of the procedure    Do not take Diabetes Med Janumet (Metformin + Sitagliptin) on the day of the procedure and HOLD 48 HOURS AFTER THE PROCEDURE.  On the morning of your procedure, take your Aspirin and any morning medicines NOT listed above.  You may use sips of water.  5. Plan for one night stay--bring personal belongings. 6. Bring a current list of your medications and current insurance cards. 7. You MUST have a  responsible person to drive you home. 8. Someone MUST be with you the first 24 hours after you arrive home or your discharge will be delayed. 9. Please wear clothes that are easy to get on and off and wear slip-on shoes.  Thank you for allowing [place] to care for you!   -- [facility]

## 2018-04-23 NOTE — Telephone Encounter (Signed)
Try to reach pt re: message below from Cecilie Kicks, NP.  Tried both #'s on demographics, and pt voicemail hasn't been set up.  Will try again later.

## 2018-04-23 NOTE — Telephone Encounter (Signed)
-----   Message from Isaiah Serge, NP sent at 04/21/2018  5:05 PM EST ----- Please let pt know Dr. Tamala Julian would like for her to have a cardiac cath.  Can be done after Jan 1st.  I had reviewed with pt and her husband already so just arrange and I will put in orders unless she wants to return to ask questions.  I see her Jan 9 if she wants to wait until after then it is fine unless she has chest pain.  And do Rt and lt heart cath.  With Dr. Tamala Julian.   ----- Message ----- From: [name], MD Sent: 04/20/2018   6:25 PM EST To: Isaiah Serge, NP    ----- Message ----- From: Isaiah Serge, NP Sent: 04/17/2018  11:56 PM EST To: [name], MD  Pt with drop in EF again, last time in 2015,  Also with LBBB, improved ? Need for cardiac cath  Please let me know.

## 2018-04-24 ENCOUNTER — Other Ambulatory Visit: Payer: Self-pay | Admitting: *Deleted

## 2018-04-24 LAB — BASIC METABOLIC PANEL
BUN/Creatinine Ratio: 18 (ref 12–28)
BUN: 30 mg/dL — ABNORMAL HIGH (ref 8–27)
CO2: 21 mmol/L (ref 20–29)
Calcium: 9.6 mg/dL (ref 8.7–10.3)
Chloride: 105 mmol/L (ref 96–106)
Creatinine, Ser: 1.69 mg/dL — ABNORMAL HIGH (ref 0.57–1.00)
GFR calc Af Amer: 37 mL/min/{1.73_m2} — ABNORMAL LOW (ref >59)
GFR calc non Af Amer: 32 mL/min/{1.73_m2} — ABNORMAL LOW (ref >59)
Glucose: 128 mg/dL — ABNORMAL HIGH (ref 65–99)
POTASSIUM: 4.3 mmol/L (ref 3.5–5.2)
Sodium: 142 mmol/L (ref 134–144)

## 2018-04-24 LAB — CBC
HEMOGLOBIN: 11.1 g/dL (ref 11.1–15.9)
Hematocrit: 33.7 % — ABNORMAL LOW (ref 34.0–46.6)
MCH: 28.4 pg (ref 26.6–33.0)
MCHC: 32.9 g/dL (ref 31.5–35.7)
MCV: 86 fL (ref 79–97)
Platelets: 251 10*3/uL (ref 150–450)
RBC: 3.91 x10E6/uL (ref 3.77–5.28)
RDW: 14 % (ref 12.3–15.4)
WBC: 6.5 10*3/uL (ref 3.4–10.8)

## 2018-04-26 ENCOUNTER — Telehealth: Payer: Self-pay | Admitting: *Deleted

## 2018-04-26 NOTE — Telephone Encounter (Signed)
Called pt re: lab results for cath scheduled 04/30/18. Due to pt's GFR, pt will need to come in for iv fluids. Pt has been advised to arrive at registration at 7 and her procedure is scheduled at 12, after fluids have been given. Pt also made aware to hold her Losartan the day before and the day of the procedure, not to take it on 04/29/18 or 04/30/18. Pt verbalized understanding.

## 2018-04-30 ENCOUNTER — Other Ambulatory Visit: Payer: Self-pay

## 2018-04-30 ENCOUNTER — Encounter (HOSPITAL_COMMUNITY)
Admission: RE | Disposition: A | Discharge status: Home / Self Care | Payer: Self-pay | Source: Home / Self Care | Attending: Interventional Cardiology

## 2018-04-30 ENCOUNTER — Ambulatory Visit: Payer: Self-pay

## 2018-04-30 ENCOUNTER — Ambulatory Visit (HOSPITAL_COMMUNITY)
Admission: RE | Admit: 2018-04-30 | Discharge: 2018-04-30 | Disposition: A | Discharge status: Home / Self Care | Payer: Medicaid Other | Attending: Interventional Cardiology | Admitting: Interventional Cardiology

## 2018-04-30 DIAGNOSIS — Z794 Long term (current) use of insulin: Secondary | ICD-10-CM | POA: Diagnosis not present

## 2018-04-30 DIAGNOSIS — Z79899 Other long term (current) drug therapy: Secondary | ICD-10-CM | POA: Diagnosis not present

## 2018-04-30 DIAGNOSIS — I428 Other cardiomyopathies: Secondary | ICD-10-CM | POA: Insufficient documentation

## 2018-04-30 DIAGNOSIS — I25118 Atherosclerotic heart disease of native coronary artery with other forms of angina pectoris: Secondary | ICD-10-CM | POA: Insufficient documentation

## 2018-04-30 DIAGNOSIS — Z9851 Tubal ligation status: Secondary | ICD-10-CM | POA: Insufficient documentation

## 2018-04-30 DIAGNOSIS — M1712 Unilateral primary osteoarthritis, left knee: Secondary | ICD-10-CM | POA: Diagnosis not present

## 2018-04-30 DIAGNOSIS — I447 Left bundle-branch block, unspecified: Secondary | ICD-10-CM | POA: Diagnosis not present

## 2018-04-30 DIAGNOSIS — I110 Hypertensive heart disease with heart failure: Secondary | ICD-10-CM | POA: Insufficient documentation

## 2018-04-30 DIAGNOSIS — Z888 Allergy status to other drugs, medicaments and biological substances status: Secondary | ICD-10-CM | POA: Diagnosis not present

## 2018-04-30 DIAGNOSIS — E1151 Type 2 diabetes mellitus with diabetic peripheral angiopathy without gangrene: Secondary | ICD-10-CM | POA: Insufficient documentation

## 2018-04-30 DIAGNOSIS — Z833 Family history of diabetes mellitus: Secondary | ICD-10-CM | POA: Insufficient documentation

## 2018-04-30 DIAGNOSIS — I10 Essential (primary) hypertension: Secondary | ICD-10-CM | POA: Diagnosis present

## 2018-04-30 DIAGNOSIS — I5022 Chronic systolic (congestive) heart failure: Secondary | ICD-10-CM | POA: Diagnosis not present

## 2018-04-30 DIAGNOSIS — N189 Chronic kidney disease, unspecified: Secondary | ICD-10-CM | POA: Diagnosis present

## 2018-04-30 DIAGNOSIS — Z7982 Long term (current) use of aspirin: Secondary | ICD-10-CM | POA: Diagnosis not present

## 2018-04-30 DIAGNOSIS — E1165 Type 2 diabetes mellitus with hyperglycemia: Secondary | ICD-10-CM

## 2018-04-30 DIAGNOSIS — E1122 Type 2 diabetes mellitus with diabetic chronic kidney disease: Secondary | ICD-10-CM | POA: Diagnosis present

## 2018-04-30 DIAGNOSIS — N183 Chronic kidney disease, stage 3 unspecified: Secondary | ICD-10-CM | POA: Diagnosis present

## 2018-04-30 DIAGNOSIS — Z96642 Presence of left artificial hip joint: Secondary | ICD-10-CM | POA: Insufficient documentation

## 2018-04-30 DIAGNOSIS — I5042 Chronic combined systolic (congestive) and diastolic (congestive) heart failure: Secondary | ICD-10-CM | POA: Diagnosis present

## 2018-04-30 HISTORY — PX: RIGHT/LEFT HEART CATH AND CORONARY ANGIOGRAPHY: CATH118266

## 2018-04-30 LAB — GLUCOSE, CAPILLARY
Glucose-Capillary: 88 mg/dL (ref 70–99)
Glucose-Capillary: 74 mg/dL (ref 70–99)

## 2018-04-30 LAB — BASIC METABOLIC PANEL
Anion gap: 11 (ref 5–15)
BUN: 29 mg/dL — ABNORMAL HIGH (ref 8–23)
CO2: 21 mmol/L — ABNORMAL LOW (ref 22–32)
Calcium: 9.4 mg/dL (ref 8.9–10.3)
Chloride: 111 mmol/L (ref 98–111)
Creatinine, Ser: 1.71 mg/dL — ABNORMAL HIGH (ref 0.44–1.00)
GFR calc Af Amer: 37 mL/min — ABNORMAL LOW (ref >60)
GFR, EST NON AFRICAN AMERICAN: 32 mL/min — AB (ref >60)
Glucose, Bld: 96 mg/dL (ref 70–99)
Potassium: 4.7 mmol/L (ref 3.5–5.1)
Sodium: 143 mmol/L (ref 135–145)

## 2018-04-30 LAB — POCT I-STAT 3, VENOUS BLOOD GAS (G3P V)
Acid-base deficit: 3 mmol/L — ABNORMAL HIGH (ref 0.0–2.0)
Bicarbonate: 22.5 mmol/L (ref 20.0–28.0)
O2 Saturation: 73 %
TCO2: 24 mmol/L (ref 22–32)
pCO2, Ven: 40.6 mmHg — ABNORMAL LOW (ref 44.0–60.0)
pH, Ven: 7.351 (ref 7.250–7.430)
pO2, Ven: 41 mmHg (ref 32.0–45.0)

## 2018-04-30 LAB — POCT I-STAT 3, ART BLOOD GAS (G3+)
Acid-base deficit: 3 mmol/L — ABNORMAL HIGH (ref 0.0–2.0)
Bicarbonate: 21.6 mmol/L (ref 20.0–28.0)
O2 Saturation: 98 %
TCO2: 23 mmol/L (ref 22–32)
pCO2 arterial: 37.2 mmHg (ref 32.0–48.0)
pH, Arterial: 7.373 (ref 7.350–7.450)
pO2, Arterial: 107 mmHg (ref 83.0–108.0)

## 2018-04-30 SURGERY — RIGHT/LEFT HEART CATH AND CORONARY ANGIOGRAPHY
Anesthesia: LOCAL

## 2018-04-30 MED ORDER — MIDAZOLAM HCL 2 MG/2ML IJ SOLN
INTRAMUSCULAR | Status: DC | PRN
  Administered 2018-04-30: 1 mg via INTRAVENOUS

## 2018-04-30 MED ORDER — MULTIVITAMIN GUMMIES ADULT PO CHEW: 2.0000 | CHEWABLE_TABLET | Freq: Every day | ORAL | Status: DC

## 2018-04-30 MED ORDER — SODIUM CHLORIDE 0.9% FLUSH: 3.0000 mL | Freq: Two times a day (BID) | INTRAVENOUS | Status: DC

## 2018-04-30 MED ORDER — ASPIRIN EC 81 MG PO TBEC: 81.0000 mg | DELAYED_RELEASE_TABLET | Freq: Every day | ORAL | Status: DC

## 2018-04-30 MED ORDER — LOSARTAN POTASSIUM 50 MG PO TABS: 100.0000 mg | ORAL_TABLET | Freq: Every day | ORAL | Status: DC

## 2018-04-30 MED ORDER — AMLODIPINE BESYLATE 5 MG PO TABS: 10.0000 mg | ORAL_TABLET | Freq: Every day | ORAL | Status: DC

## 2018-04-30 MED ORDER — FENTANYL CITRATE (PF) 100 MCG/2ML IJ SOLN
INTRAMUSCULAR | Status: AC
  Filled 2018-04-30: qty 2

## 2018-04-30 MED ORDER — HEPARIN SODIUM (PORCINE) 1000 UNIT/ML IJ SOLN
INTRAMUSCULAR | Status: AC
  Filled 2018-04-30: qty 1

## 2018-04-30 MED ORDER — MIDAZOLAM HCL 2 MG/2ML IJ SOLN
INTRAMUSCULAR | Status: AC
  Filled 2018-04-30: qty 2

## 2018-04-30 MED ORDER — ACETAMINOPHEN 325 MG PO TABS: 650.0000 mg | ORAL_TABLET | ORAL | Status: DC | PRN

## 2018-04-30 MED ORDER — SODIUM CHLORIDE 0.9 % IV SOLN: 250.0000 mL | INTRAVENOUS | Status: DC | PRN

## 2018-04-30 MED ORDER — ATORVASTATIN CALCIUM 40 MG PO TABS: 40.0000 mg | ORAL_TABLET | Freq: Every day | ORAL | Status: DC

## 2018-04-30 MED ORDER — HEPARIN (PORCINE) IN NACL 1000-0.9 UT/500ML-% IV SOLN
INTRAVENOUS | Status: DC | PRN
  Administered 2018-04-30 (×2): 500 mL

## 2018-04-30 MED ORDER — INSULIN DETEMIR 100 UNIT/ML FLEXPEN: 15.0000 [IU] | PEN_INJECTOR | Freq: Every day | SUBCUTANEOUS | Status: DC

## 2018-04-30 MED ORDER — HEPARIN SODIUM (PORCINE) 1000 UNIT/ML IJ SOLN
INTRAMUSCULAR | Status: DC | PRN
  Administered 2018-04-30: 4000 [IU] via INTRAVENOUS

## 2018-04-30 MED ORDER — VERAPAMIL HCL 2.5 MG/ML IV SOLN
INTRAVENOUS | Status: AC
  Filled 2018-04-30: qty 2

## 2018-04-30 MED ORDER — DIPHENHYDRAMINE HCL 25 MG PO TABS: 25.0000 mg | ORAL_TABLET | Freq: Every day | ORAL | Status: DC | PRN

## 2018-04-30 MED ORDER — VERAPAMIL HCL 2.5 MG/ML IV SOLN
INTRAVENOUS | Status: DC | PRN
  Administered 2018-04-30: 10 mL via INTRA_ARTERIAL

## 2018-04-30 MED ORDER — SITAGLIPTIN PHOS-METFORMIN HCL 50-1000 MG PO TABS: 1.0000 | ORAL_TABLET | Freq: Two times a day (BID) | ORAL | Status: DC

## 2018-04-30 MED ORDER — FAMOTIDINE 20 MG PO TABS: 20.0000 mg | ORAL_TABLET | Freq: Every day | ORAL | Status: DC | PRN

## 2018-04-30 MED ORDER — IOHEXOL 350 MG/ML SOLN
INTRAVENOUS | Status: DC | PRN
  Administered 2018-04-30: 45 mL via INTRACARDIAC

## 2018-04-30 MED ORDER — HYDRALAZINE HCL 20 MG/ML IJ SOLN
10.0000 mg | Freq: Once | INTRAMUSCULAR | Status: AC
  Administered 2018-04-30: 10 mg via INTRAVENOUS
  Filled 2018-04-30: qty 1

## 2018-04-30 MED ORDER — SODIUM CHLORIDE 0.9 % IV SOLN
INTRAVENOUS | Status: DC
  Administered 2018-04-30: 08:00:00 via INTRAVENOUS

## 2018-04-30 MED ORDER — DIPHENHYDRAMINE HCL (SLEEP) 25 MG PO CAPS: 50.0000 mg | ORAL_CAPSULE | Freq: Every evening | ORAL | Status: DC | PRN

## 2018-04-30 MED ORDER — SODIUM CHLORIDE 0.9% FLUSH: 3.0000 mL | INTRAVENOUS | Status: DC | PRN

## 2018-04-30 MED ORDER — SODIUM CHLORIDE 0.9 % IV SOLN: INTRAVENOUS | Status: DC

## 2018-04-30 MED ORDER — HEPARIN (PORCINE) IN NACL 1000-0.9 UT/500ML-% IV SOLN
INTRAVENOUS | Status: AC
  Filled 2018-04-30: qty 500

## 2018-04-30 MED ORDER — CARVEDILOL 12.5 MG PO TABS: 25.0000 mg | ORAL_TABLET | Freq: Two times a day (BID) | ORAL | Status: DC

## 2018-04-30 MED ORDER — LIDOCAINE HCL (PF) 1 % IJ SOLN
INTRAMUSCULAR | Status: DC | PRN
  Administered 2018-04-30: 5 mL

## 2018-04-30 MED ORDER — ASPIRIN 81 MG PO CHEW: 81.0000 mg | CHEWABLE_TABLET | ORAL | Status: DC

## 2018-04-30 MED ORDER — VITAMIN D3 125 MCG (5000 UT) PO CAPS: 5000.0000 [IU] | ORAL_CAPSULE | Freq: Every day | ORAL | Status: DC

## 2018-04-30 MED ORDER — FENTANYL CITRATE (PF) 100 MCG/2ML IJ SOLN
INTRAMUSCULAR | Status: DC | PRN
  Administered 2018-04-30: 25 ug via INTRAVENOUS

## 2018-04-30 MED ORDER — LIDOCAINE HCL (PF) 1 % IJ SOLN
INTRAMUSCULAR | Status: AC
  Filled 2018-04-30: qty 30

## 2018-04-30 MED ORDER — ONDANSETRON HCL 4 MG/2ML IJ SOLN: 4.0000 mg | Freq: Four times a day (QID) | INTRAMUSCULAR | Status: DC | PRN

## 2018-04-30 SURGICAL SUPPLY — 12 items
CATH BALLN WEDGE 5F 110CM (CATHETERS) ×1 IMPLANT
CATH INFINITI 5 FR JL3.5 (CATHETERS) ×1 IMPLANT
CATH INFINITI JR4 5F (CATHETERS) ×1 IMPLANT
DEVICE RAD COMP TR BAND LRG (VASCULAR PRODUCTS) ×2 IMPLANT
GLIDESHEATH SLEND A-KIT 6F 22G (SHEATH) ×1 IMPLANT
GUIDEWIRE INQWIRE 1.5J.035X260 (WIRE) IMPLANT
INQWIRE 1.5J .035X260CM (WIRE) ×2
KIT HEART LEFT (KITS) ×2 IMPLANT
PACK CARDIAC CATHETERIZATION (CUSTOM PROCEDURE TRAY) ×2 IMPLANT
SHEATH GLIDE SLENDER 4/5FR (SHEATH) ×1 IMPLANT
TRANSDUCER W/STOPCOCK (MISCELLANEOUS) ×2 IMPLANT
TUBING CIL FLEX 10 FLL-RA (TUBING) ×2 IMPLANT

## 2018-04-30 NOTE — Discharge Instructions (Signed)
NO JANUMET FOR 2 DAYS/ HOLD LOSARTAN UNTIL BLOOD WORK DONE ON THURSDAY NEED TO COME TO [facility] OFFICE FOR BLOODWORK THURSDAY MORNING   Radial Site Care  This sheet gives you information about how to care for yourself after your procedure. Your health care provider may also give you more specific instructions. If you have problems or questions, contact your health care provider. What can I expect after the procedure? After the procedure, it is common to have:  Bruising and tenderness at the catheter insertion area. Follow these instructions at home: Medicines  Take over-the-counter and prescription medicines only as told by your health care provider. Insertion site care  Follow instructions from your health care provider about how to take care of your insertion site. Make sure you: ? Wash your hands with soap and water before you change your bandage (dressing). If soap and water are not available, use hand sanitizer. ? Change your dressing as told by your health care provider. ? Leave stitches (sutures), skin glue, or adhesive strips in place. These skin closures may need to stay in place for 2 weeks or longer. If adhesive strip edges start to loosen and curl up, you may trim the loose edges. Do not remove adhesive strips completely unless your health care provider tells you to do that.  Check your insertion site every day for signs of infection. Check for: ? Redness, swelling, or pain. ? Fluid or blood. ? Pus or a bad smell. ? Warmth.  Do not take baths, swim, or use a hot tub until your health care provider approves.  You may shower 24-48 hours after the procedure, or as directed by your health care provider. ? Remove the dressing and gently wash the site with plain soap and water. ? Pat the area dry with a clean towel. ? Do not rub the site. That could cause bleeding.  Do not apply powder or lotion to the site. Activity   For 24 hours after the procedure, or as directed by  your health care provider: ? Do not flex or bend the affected arm. ? Do not push or pull heavy objects with the affected arm. ? Do not drive yourself home from the hospital or clinic. You may drive 24 hours after the procedure unless your health care provider tells you not to. ? Do not operate machinery or power tools.  Do not lift anything that is heavier than 10 lb (4.5 kg), or the limit that you are told, until your health care provider says that it is safe.  Ask your health care provider when it is okay to: ? Return to work or school. ? Resume usual physical activities or sports. ? Resume sexual activity. General instructions  If the catheter site starts to bleed, raise your arm and put firm pressure on the site. If the bleeding does not stop, get help right away. This is a medical emergency.  If you went home on the same day as your procedure, a responsible adult should be with you for the first 24 hours after you arrive home.  Keep all follow-up visits as told by your health care provider. This is important. Contact a health care provider if:  You have a fever.  You have redness, swelling, or yellow drainage around your insertion site. Get help right away if:  You have unusual pain at the radial site.  The catheter insertion area swells very fast.  The insertion area is bleeding, and the bleeding does not stop when  you hold steady pressure on the area.  Your arm or hand becomes pale, cool, tingly, or numb. These symptoms may represent a serious problem that is an emergency. Do not wait to see if the symptoms will go away. Get medical help right away. Call your local emergency services (911 in the [place] Do not drive yourself to the hospital. Summary  After the procedure, it is common to have bruising and tenderness at the site.  Follow instructions from your health care provider about how to take care of your radial site wound. Check the wound every day for signs of  infection.  Do not lift anything that is heavier than 10 lb (4.5 kg), or the limit that you are told, until your health care provider says that it is safe. This information is not intended to replace advice given to you by your health care provider. Make sure you discuss any questions you have with your health care provider. Document Released: 05/21/2010 Document Revised: 05/24/2017 Document Reviewed: 05/24/2017 Elsevier Interactive Patient Education  2019 [employer].

## 2018-04-30 NOTE — CV Procedure (Signed)
   Diffuse three-vessel CAD, with total occlusion of mid RCA (codominant) filling by left-to-right collaterals, 60 to 70% mid LAD and 50% ostial LAD, and overall diffuse severe disease involving the circumflex and all of its branches with first obtuse marginal containing segmental 80% stenosis, small second obtuse marginal 90% stenosis, diffusely diseased third obtuse marginal with 80% narrowing, 90% distal circumflex followed by a small obtuse marginal.  Left main is widely patent.  Previously documented severe LV systolic dysfunction.  Recommend aggressive guideline directed therapy for LV systolic dysfunction and therapy for CAD (including aggressive lipid-lowering, blood pressure control, and anti-ischemic therapy in the form of beta-blocker and long-acting nitrates).  Has "Heartburn which is likely an ischemic complaint and not GI.  May eventually require surgical evaluation.

## 2018-04-30 NOTE — Progress Notes (Signed)
Dr Tamala Julian in and notified of b/p and order noted and med given

## 2018-04-30 NOTE — CV Procedure (Deleted)
   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  Left coronary injection leads to shunting was sufficient volume to opacify the left ventricle.  Normal left main.  Normal LAD.  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  Normal left ventricular systolic function with LVEF 70%.  RECOMMENDATIONS:   Aggressive blood pressure control which should include beta-blocker therapy.  Therapy of hyperlipidemia if present.  Treatment of aneurysm will be considered.  Management will likely be surgical as stenting and coiling in this proximal segment with a large neck would present significant technical problems.  Needs coronary CT angios performed to accurately size the aneurysm and can then be used to follow over time for evidence of growth.

## 2018-04-30 NOTE — Research (Signed)
[place] Informed Consent   Subject Name: [name]  Subject met inclusion and exclusion criteria.  The informed consent form, study requirements and expectations were reviewed with the subject and questions and concerns were addressed prior to the signing of the consent form.  The subject verbalized understanding of the trail requirements.  The subject agreed to participate in the [facility] trial and signed the informed consent.  The informed consent was obtained prior to performance of any protocol-specific procedures for the subject.  A copy of the signed informed consent was given to the subject and a copy was placed in the subject's medical record.  [name] 04/30/2018, 9:25 AM

## 2018-04-30 NOTE — Interval H&P Note (Signed)
History and Physical Interval Note: Cath Lab Visit (complete for each Cath Lab visit)  Clinical Evaluation Leading to the Procedure:   ACS: No.  Non-ACS:    Anginal Classification: CCS III  Anti-ischemic medical therapy: Minimal Therapy (1 class of medications)  Non-Invasive Test Results: No non-invasive testing performed  Prior CABG: No previous CABG       04/30/2018 2:11 PM  [name]  has presented today for surgery, with the diagnosis of Myopathy, CP  The various methods of treatment have been discussed with the patient and family. After consideration of risks, benefits and other options for treatment, the patient has consented to  Procedure(s): RIGHT/LEFT HEART CATH AND CORONARY ANGIOGRAPHY (N/A) as a surgical intervention .  The patient's history has been reviewed, patient examined, no change in status, stable for surgery.  I have reviewed the patient's chart and labs.  Questions were answered to the patient's satisfaction.     [name] III

## 2018-05-01 ENCOUNTER — Telehealth: Payer: Self-pay | Admitting: *Deleted

## 2018-05-01 ENCOUNTER — Encounter (HOSPITAL_COMMUNITY): Payer: Self-pay | Admitting: Interventional Cardiology

## 2018-05-01 ENCOUNTER — Other Ambulatory Visit: Payer: Self-pay | Admitting: *Deleted

## 2018-05-01 DIAGNOSIS — I5042 Chronic combined systolic (congestive) and diastolic (congestive) heart failure: Secondary | ICD-10-CM

## 2018-05-01 MED ORDER — ISOSORBIDE MONONITRATE ER 60 MG PO TB24: 60.0000 mg | ORAL_TABLET | Freq: Every day | ORAL | 3 refills | Status: DC

## 2018-05-01 NOTE — Telephone Encounter (Signed)
-----   Message from [name], MD sent at 05/01/2018  2:07 PM EST ----- Let the patient know start isosorbide mononitrate 60 mg/day A copy will be sent to [name], DO

## 2018-05-01 NOTE — Telephone Encounter (Signed)
Spoke with pt and made her aware of new medication and side effects. Reminded pt to come in for labs on Thursday.  Pt verbalized understanding and was in agreement with this plan.

## 2018-05-03 ENCOUNTER — Other Ambulatory Visit: Payer: Self-pay | Admitting: *Deleted

## 2018-05-03 DIAGNOSIS — I5042 Chronic combined systolic (congestive) and diastolic (congestive) heart failure: Secondary | ICD-10-CM | POA: Diagnosis not present

## 2018-05-03 LAB — BASIC METABOLIC PANEL
BUN/Creatinine Ratio: 17 (ref 12–28)
BUN: 27 mg/dL (ref 8–27)
CO2: 24 mmol/L (ref 20–29)
Calcium: 9.7 mg/dL (ref 8.7–10.3)
Chloride: 104 mmol/L (ref 96–106)
Creatinine, Ser: 1.62 mg/dL — ABNORMAL HIGH (ref 0.57–1.00)
GFR calc Af Amer: 39 mL/min/{1.73_m2} — ABNORMAL LOW (ref >59)
GFR calc non Af Amer: 34 mL/min/{1.73_m2} — ABNORMAL LOW (ref >59)
Glucose: 150 mg/dL — ABNORMAL HIGH (ref 65–99)
Potassium: 4.8 mmol/L (ref 3.5–5.2)
Sodium: 143 mmol/L (ref 134–144)

## 2018-05-03 MED FILL — ATORVASTATIN 40 MG TABLET: 40 | 30 days supply | Qty: 30 | Fill #2

## 2018-05-07 ENCOUNTER — Other Ambulatory Visit: Payer: Self-pay

## 2018-05-07 ENCOUNTER — Ambulatory Visit (INDEPENDENT_AMBULATORY_CARE_PROVIDER_SITE_OTHER): Payer: Self-pay | Admitting: Internal Medicine

## 2018-05-07 ENCOUNTER — Encounter: Payer: Self-pay | Admitting: Internal Medicine

## 2018-05-07 VITALS — BP 157/58 | HR 79 | Temp 99.0°F | Ht 63.0 in | Wt 172.7 lb

## 2018-05-07 DIAGNOSIS — B349 Viral infection, unspecified: Secondary | ICD-10-CM

## 2018-05-07 DIAGNOSIS — I2510 Atherosclerotic heart disease of native coronary artery without angina pectoris: Secondary | ICD-10-CM

## 2018-05-07 DIAGNOSIS — I129 Hypertensive chronic kidney disease with stage 1 through stage 4 chronic kidney disease, or unspecified chronic kidney disease: Secondary | ICD-10-CM

## 2018-05-07 DIAGNOSIS — N183 Chronic kidney disease, stage 3 (moderate): Secondary | ICD-10-CM

## 2018-05-07 DIAGNOSIS — E1122 Type 2 diabetes mellitus with diabetic chronic kidney disease: Secondary | ICD-10-CM

## 2018-05-07 MED ORDER — OSELTAMIVIR PHOSPHATE 30 MG PO CAPS: 30.0000 mg | ORAL_CAPSULE | Freq: Two times a day (BID) | ORAL | 0 refills | Status: DC

## 2018-05-07 NOTE — Patient Instructions (Addendum)
Please take tamiflu 30mg  twice daily for 5 days.  Take tylenol 650mg  every 8 hours.  I would expect your symptoms to begin improving over the next 2-3 days, but it may take a couple of weeks to completely recover. If you are not feeling any better or if your cough is worsening in 1wk please come back to the clinic.

## 2018-05-07 NOTE — Progress Notes (Signed)
   CC: cough  HPI:  [name] is a 63 y.o. with a PMH of T2DM, CKD 3, CAD, HTN, presenting to clinic for cough.  Patient endorses starting to feel ill 1/1 with sneezing, however had acute worsening of symptoms starting 2 days ago with congestion, headache, dizziness, diarrhea, myalgias, fatigue, poor appetite, and cough productive of tan sputum. She endorses shortness of breath and chest tightness after coughing fits.  Ill contacts include her daughter and grandson recently. She has tried occasional tylenol and alkaseltzer for her symptoms with minimal relief. Her sputum production does seem to be less today than this weekend.   Please see problem based Assessment and Plan for status of patients chronic conditions.  Past Medical History:  Diagnosis Date  . Arthritis   . Arthritis of left hip 08/18/2013   S/p total hip arthroplasty.    . CHF (congestive heart failure) ([place])    2014...@ [facility]  . Diabetes mellitus    diagnosed 2001  . Healthcare maintenance 06/16/2015  . Hypertension   . Influenza A 06/01/2017  . Influenza, pneumonia 06/01/2017  . Left flank pain 10/11/2016  . Problem with sexual relationship 10/13/2017  . PVD (peripheral vascular disease) ([place])     Review of Systems:   Per HPI  Physical Exam:  Vitals:   05/07/18 1330  BP: (!) 157/58  Pulse: 79  Temp: 99 F (37.2 C)  TempSrc: Oral  SpO2: 94%  Weight: 172 lb 11.2 oz (78.3 kg)  Height: 5\' 3"  (1.6 m)   GENERAL- alert HEENT- oral mucosa appears moist, cobblestoning in posterior oropharynx. CARDIAC- RRR, no murmurs, rubs or gallops. RESP- Moving equal volumes of air, and clear to auscultation bilaterally, no wheezes. Initial crackles and rhonchi which clear with cough. ABDOMEN- Soft, nontender, bowel sounds present. EXTREMITIES- pulse 2+, symmetric, no pedal edema.  Assessment & Plan:   See Encounters Tab for problem based charting.   Patient discussed with Dr. Abelardo Diesel,  MD Internal Medicine PGY-3

## 2018-05-07 NOTE — Assessment & Plan Note (Signed)
Patient with s/sx of viral illness, likely influenza based on her symptoms and flu activity in the area.   Plan: --tamiflu 30mg  BID (renal dosing for her GFR) --tylenol 650 q8hr --advised to RTC if symptoms not improving or worsening in the next week

## 2018-05-08 NOTE — Progress Notes (Signed)
Internal Medicine Clinic Attending  Case discussed with Dr. Svalina  at the time of the visit.  We reviewed the resident's history and exam and pertinent patient test results.  I agree with the assessment, diagnosis, and plan of care documented in the resident's note.  

## 2018-05-09 NOTE — Progress Notes (Addendum)
Cardiology Office Note   Date:  05/10/2018   ID:  [name], DOB 11-18-1955, MRN [id number]  PCP:  Alyssa Heck, DO  Cardiologist:  Dr. Tamala [name]    Chief Complaint  Patient presents with  . Congestive Heart Failure  . Coronary Artery Disease      History of Present Illness: [name] is a 63 y.o. female who presents for post cath follow up. With significant CAD.    She has a hx of HTN, DM, PVD, chronic diastolic heart failure, prior history of low EF in 2015 of 30-35%, improved EF in 2018, and history of abnormal NST but no hx of cardiac cath. She was admitted 06/01/17-06/05/17 for influenza A. Echo that admission showed reduced LVEF of 25-30%. He also had chest pain prior to discharge and was restarted on a lower dose of losartan 50 mg (was on 100 mg). Her discharge weight was 167 lbs. Nuclear stress test on 10/2016 without ischemia.   Admitted 12/5 and discharged 12/7 with acute hypoxic resp failure with acute HF   Echo with EF 30-35%, diffuse hypokinesis in inf myocardium.  G1DD.  LA severely dilated.  EKG with LBBB new and troponins flat 0.05  Echo 08/2017 with EF 60-65% G2DD. LA mildly dilated.  She did have EF at 30% in 2015.    Since dec discharge she is feeling better.  She had no lower ext edema but increased abd. Girth -  She was having to prop herself up at night to sleep.  Now she does not .  In the hospital she did have some chest pressure.  But none at home.  With her symptoms and risk factors for CAD and decrease in EF she went for cardiac cath.    Pt had a cardiac cath  Heartburn  Probable anginal equivukent  Per Dr. Tamala [name] "Severe diffuse coronary disease. If up titration of medical therapy does not relieve exertional "indigestion", may need to refer for surgical opinion concerning CABG. Reluctant at this point because LAD is not severely involved. Circumflex cannot be completely revascularized with PCI."   Pt with flu recently.  Seen on the 6th by  PCP. She was unable to afford the tamiflu -now with increased SOB, has actually been SOB since cath.  Can hardly walk in her house without SOB.  Now with congested cough.  Has to sit up to breathe at night. No inhalers.  No fevers, she had those last week and none since.  No chest pain but still with indigestion, though some better.    The imdur she believes makes her dizzy at times, but she is willing to keep taking, "not that bad" We reviewed her cath report including drawing.     Past Medical History:  Diagnosis Date  . Arthritis   . Arthritis of left hip 08/18/2013   S/p total hip arthroplasty.    . CHF (congestive heart failure) ([place])    2014...@ [facility]  . Diabetes mellitus    diagnosed 2001  . Healthcare maintenance 06/16/2015  . Hypertension   . Influenza A 06/01/2017  . Influenza, pneumonia 06/01/2017  . Left flank pain 10/11/2016  . Problem with sexual relationship 10/13/2017  . PVD (peripheral vascular disease) ([place])     Past Surgical History:  Procedure Laterality Date  . ABDOMINAL ANGIOGRAM  12/28/2012  . ABDOMINAL ANGIOGRAM N/A 12/29/2011   Procedure: ABDOMINAL ANGIOGRAM;  Surgeon: Conrad Wamsutter, MD;  Location: [facility] CATH LAB;  Service: Cardiovascular;  Laterality:  N/A;  . ABDOMINAL AORTAGRAM N/A 12/21/2012   Procedure: ABDOMINAL Maxcine Ham;  Surgeon: Elam Dutch, MD;  Location: [facility] CATH LAB;  Service: Cardiovascular;  Laterality: N/A;  . RIGHT/LEFT HEART CATH AND CORONARY ANGIOGRAPHY N/A 04/30/2018   Procedure: RIGHT/LEFT HEART CATH AND CORONARY ANGIOGRAPHY;  Surgeon: [name], MD;  Location: [facility] CV LAB;  Service: Cardiovascular;  Laterality: N/A;  . TOTAL HIP ARTHROPLASTY Left 08/19/2013   Procedure: TOTAL HIP ARTHROPLASTY;  Surgeon: Kerin Salen, MD;  Location: [facility];  Service: Orthopedics;  Laterality: Left;  . TUBAL LIGATION       Current Outpatient Medications  Medication Sig Dispense Refill  . aspirin EC 81 MG tablet Take 1 tablet (81 mg total) by  mouth daily. IM program, hope fund 30 tablet 11  . atorvastatin (LIPITOR) 40 MG tablet Take 1 tablet (40 mg total) by mouth daily. 90 tablet 0  . blood glucose meter kit and supplies KIT Dispense based on patient and insurance preference. Use up to four times daily as directed. (FOR ICD-9 250.00, 250.01). 1 each 0  . carvedilol (COREG) 25 MG tablet Take 1 tablet (25 mg total) by mouth 2 (two) times daily. 180 tablet 3  . Cholecalciferol (VITAMIN D3) 125 MCG (5000 UT) CAPS Take 5,000 Units by mouth daily.    . diphenhydrAMINE (BENADRYL) 25 MG tablet Take 25 mg by mouth daily as needed for allergies.    . diphenhydrAMINE HCl, Sleep, (ZZZQUIL) 25 MG CAPS Take 50 mg by mouth at bedtime as needed (sleep).    . famotidine (PEPCID) 20 MG tablet Take 20 mg by mouth daily as needed for heartburn or indigestion.    . Insulin Detemir (LEVEMIR FLEXTOUCH) 100 UNIT/ML Pen Inject 15 Units into the skin at bedtime. 15 mL 0  . isosorbide mononitrate (IMDUR) 60 MG 24 hr tablet Take 1 tablet (60 mg total) by mouth daily. 90 tablet 3  . losartan (COZAAR) 100 MG tablet Take 1 tablet (100 mg total) by mouth daily. 30 tablet 3  . Multiple Vitamins-Minerals (MULTIVITAMIN GUMMIES ADULT) CHEW Chew 2 tablets by mouth daily.    . sitaGLIPtin-metformin (JANUMET) 50-1000 MG tablet Take 1 tablet by mouth 2 (two) times daily with a meal. 60 tablet 6   No current facility-administered medications for this visit.     Allergies:   Lisinopril and Plavix [clopidogrel bisulfate]    Social History:  The patient  reports that she quit smoking about 31 years ago. She has never used smokeless tobacco. She reports that she does not drink alcohol or use drugs.   Family History:  The patient's family history includes Diabetes in her mother.    ROS:  General:no colds or fevers, no weight changes Skin:no rashes or ulcers HEENT:no blurred vision, no congestion CV:see HPI PUL:see HPI GI:no diarrhea constipation or melena, no  indigestion GU:no hematuria, no dysuria MS:no joint pain, no claudication Neuro:no syncope, occ lightheadedness Endo:+ diabetes controlled, no thyroid disease  Wt Readings from Last 3 Encounters:  05/10/18 170 lb 1.9 oz (77.2 kg)  05/07/18 172 lb 11.2 oz (78.3 kg)  04/30/18 177 lb (80.3 kg)     PHYSICAL EXAM: VS:  BP (!) 142/68   Pulse 81   Ht '5\' 3"'  (1.6 m)   Wt 170 lb 1.9 oz (77.2 kg)   SpO2 95%   BMI 30.14 kg/m  , BMI Body mass index is 30.14 kg/m. General:Pleasant affect, NAD- wearing mask Skin:Warm and dry, brisk capillary refill HEENT:normocephalic, sclera  clear, mucus membranes moist Neck:supple, + JVD, no bruits  Heart:S1S2 RRR without murmur, gallup, rub or click Lungs: with rales, rhonchi, and wheezes PZW:CHEN, non tender, + BS, do not palpate liver spleen or masses Ext:no lower ext edema, 2+ pedal pulses, 2+ radial pulses Neuro:alert and oriented X 3, MAE, follows commands, + facial symmetry    EKG:  EKG is NOT ordered today.    Recent Labs: 08/31/2017: ALT 14 04/06/2018: B Natriuretic Peptide 342.3 04/24/2018: Hemoglobin 11.1; Platelets 251 05/03/2018: BUN 27; Creatinine, Ser 1.62; Potassium 4.8; Sodium 143    Lipid Panel    Component Value Date/Time   CHOL 149 08/31/2017 0941   TRIG 74 08/31/2017 0941   HDL 60 08/31/2017 0941   CHOLHDL 2.5 08/31/2017 0941   CHOLHDL 3.7 11/08/2012 1331   VLDL 19 11/08/2012 1331   LDLCALC 74 08/31/2017 0941       Other studies Reviewed: Additional studies/ records that were reviewed today include: .  Cardiac cath 04/30/18  Severe diffuse involvement of the circumflex with 70 to 80% proximal to mid stenosis, diffuse 80% and a large second obtuse marginal, and segmental 80% stenosis in the ostial to proximal first obtuse marginal/ramus.  50% ostial LAD with mid vessel eccentric 65 to 70% narrowing.  Chronic occlusion of the mid RCA fills by left to right collaterals.  Relatively normal pulmonary artery pressures  and normal LV EDP.  Echo demonstrates reduced LVEF 30 to 35%.  RECOMMENDATIONS:   Guideline directed therapy for LV systolic dysfunction  Anti-ischemic therapy with long-acting nitrates  Aggressive lipid-lowering  If unable to control symptoms consider referral for surgical revascularization opinion.  Reluctant to refer the patient at this time because LAD has only moderate disease.  Circumflex territory has multiple diffusely diseased segments as is typical in diabetic patients.    ECHO 04/07/18 Study Conclusions  - Left ventricle: The cavity size was normal. There was moderate   concentric hypertrophy. Systolic function was moderately to   severely reduced. The estimated ejection fraction was in the   range of 30% to 35%. Diffuse hypokinesis worse in the inferior   myocardium. Doppler parameters are consistent with abnormal left   ventricular relaxation (grade 1 diastolic dysfunction). Doppler   parameters are consistent with high ventricular filling pressure. - Aortic valve: Transvalvular velocity was within the normal range.   There was no stenosis. There was mild regurgitation. - Mitral valve: Calcified annulus. Mobility of the posterior   leaflet was restricted. Transvalvular velocity was within the   normal range. There was no evidence for stenosis. There was mild   regurgitation. Valve area by continuity equation (using LVOT   flow): 1.34 cm^2. - Left atrium: The atrium was severely dilated. - Right ventricle: The cavity size was normal. Wall thickness was   normal. Systolic function was normal. - Tricuspid valve: There was trivial regurgitation. - Pulmonary arteries: Systolic pressure was within the normal   range. PA peak pressure: 16 mm Hg (S).  Impressions:  - Compared with the echo 06/7780 systolic function is significantly   reduced. ASSESSMENT AND PLAN:  1.  Acute on chronic systolic HF, in combination with flu.  I will check 2 V CXR today, gave coupon  for tamiflu and added lasix 40 mg daily along with 20 meq of Kdur.  We discussed if SOB did not improve she should go to ER.   Will see back in 2 weeks for follow up.  2.  Cardiomyopathy, on coreg 25 mg BID, on  arb, but she cannot afford entresto now, she is trying to get back on medicaid.  She is on IMDUR   --spironolactone was held for renal insuff.  Will check with Dr. Tamala [name].     3.  CAD with significant disease.  Once flu has resolved may need to go to [facility] for consult.  Will decide on 2 week visit.    4.  DM per PCP 5. Chronic LBBB       Chest xray + PNA vs atypical CHF.  Reviewed with Dr. Tamala [name].  Plan to admit and IV lasix, ABX and nebs.  MD to see there.   Current medicines are reviewed with the patient today.  The patient Has no concerns regarding medicines.  The following changes have been made:  See above Labs/ tests ordered today include:see above  Disposition:   FU:  see above  Signed, Cecilie Kicks, NP  05/10/2018 9:04 AM    [address] [place], [place], [place], [place] Phone: [phone]; Fax: [phone]

## 2018-05-10 ENCOUNTER — Other Ambulatory Visit: Payer: Self-pay

## 2018-05-10 ENCOUNTER — Inpatient Hospital Stay (HOSPITAL_COMMUNITY)
Admission: AD | Admit: 2018-05-10 | Discharge: 2018-05-12 | DRG: 291 | Disposition: A | Discharge status: Home / Self Care | Payer: Medicaid Other | Source: Ambulatory Visit | Attending: Internal Medicine | Admitting: Internal Medicine

## 2018-05-10 ENCOUNTER — Encounter: Payer: Self-pay | Admitting: Cardiology

## 2018-05-10 ENCOUNTER — Other Ambulatory Visit: Payer: Self-pay | Admitting: Internal Medicine

## 2018-05-10 ENCOUNTER — Ambulatory Visit
Admission: RE | Admit: 2018-05-10 | Discharge: 2018-05-10 | Disposition: A | Discharge status: Home / Self Care | Payer: No Typology Code available for payment source | Source: Ambulatory Visit | Attending: Cardiology | Admitting: Cardiology

## 2018-05-10 ENCOUNTER — Telehealth: Payer: Self-pay | Admitting: Interventional Cardiology

## 2018-05-10 ENCOUNTER — Telehealth: Payer: Self-pay

## 2018-05-10 ENCOUNTER — Telehealth: Payer: Self-pay | Admitting: Nurse Practitioner

## 2018-05-10 ENCOUNTER — Ambulatory Visit (INDEPENDENT_AMBULATORY_CARE_PROVIDER_SITE_OTHER): Payer: Self-pay | Admitting: Cardiology

## 2018-05-10 ENCOUNTER — Encounter (HOSPITAL_COMMUNITY): Payer: Self-pay | Admitting: General Practice

## 2018-05-10 VITALS — BP 142/68 | HR 81 | Ht 63.0 in | Wt 170.1 lb

## 2018-05-10 DIAGNOSIS — I10 Essential (primary) hypertension: Secondary | ICD-10-CM

## 2018-05-10 DIAGNOSIS — I447 Left bundle-branch block, unspecified: Secondary | ICD-10-CM | POA: Diagnosis present

## 2018-05-10 DIAGNOSIS — Z96642 Presence of left artificial hip joint: Secondary | ICD-10-CM | POA: Diagnosis present

## 2018-05-10 DIAGNOSIS — D649 Anemia, unspecified: Secondary | ICD-10-CM

## 2018-05-10 DIAGNOSIS — I5042 Chronic combined systolic (congestive) and diastolic (congestive) heart failure: Secondary | ICD-10-CM

## 2018-05-10 DIAGNOSIS — E876 Hypokalemia: Secondary | ICD-10-CM | POA: Diagnosis present

## 2018-05-10 DIAGNOSIS — I5043 Acute on chronic combined systolic (congestive) and diastolic (congestive) heart failure: Secondary | ICD-10-CM | POA: Diagnosis present

## 2018-05-10 DIAGNOSIS — I2510 Atherosclerotic heart disease of native coronary artery without angina pectoris: Secondary | ICD-10-CM | POA: Diagnosis present

## 2018-05-10 DIAGNOSIS — IMO0002 Reserved for concepts with insufficient information to code with codable children: Secondary | ICD-10-CM

## 2018-05-10 DIAGNOSIS — E1151 Type 2 diabetes mellitus with diabetic peripheral angiopathy without gangrene: Secondary | ICD-10-CM

## 2018-05-10 DIAGNOSIS — I429 Cardiomyopathy, unspecified: Secondary | ICD-10-CM | POA: Diagnosis present

## 2018-05-10 DIAGNOSIS — I5021 Acute systolic (congestive) heart failure: Secondary | ICD-10-CM

## 2018-05-10 DIAGNOSIS — Z794 Long term (current) use of insulin: Secondary | ICD-10-CM | POA: Diagnosis not present

## 2018-05-10 DIAGNOSIS — R059 Cough, unspecified: Secondary | ICD-10-CM

## 2018-05-10 DIAGNOSIS — J189 Pneumonia, unspecified organism: Secondary | ICD-10-CM

## 2018-05-10 DIAGNOSIS — N183 Chronic kidney disease, stage 3 unspecified: Secondary | ICD-10-CM

## 2018-05-10 DIAGNOSIS — R05 Cough: Secondary | ICD-10-CM

## 2018-05-10 DIAGNOSIS — J121 Respiratory syncytial virus pneumonia: Secondary | ICD-10-CM | POA: Diagnosis present

## 2018-05-10 DIAGNOSIS — Z7982 Long term (current) use of aspirin: Secondary | ICD-10-CM

## 2018-05-10 DIAGNOSIS — Z833 Family history of diabetes mellitus: Secondary | ICD-10-CM | POA: Diagnosis not present

## 2018-05-10 DIAGNOSIS — I110 Hypertensive heart disease with heart failure: Secondary | ICD-10-CM | POA: Diagnosis present

## 2018-05-10 DIAGNOSIS — Z87891 Personal history of nicotine dependence: Secondary | ICD-10-CM | POA: Diagnosis not present

## 2018-05-10 DIAGNOSIS — E1165 Type 2 diabetes mellitus with hyperglycemia: Secondary | ICD-10-CM

## 2018-05-10 HISTORY — DX: Pure hypercholesterolemia, unspecified: E78.00

## 2018-05-10 HISTORY — DX: Other chronic pain: G89.29

## 2018-05-10 HISTORY — DX: Chronic obstructive pulmonary disease, unspecified: J44.9

## 2018-05-10 HISTORY — DX: Low back pain, unspecified: M54.50

## 2018-05-10 HISTORY — DX: Type 2 diabetes mellitus without complications: E11.9

## 2018-05-10 HISTORY — DX: Low back pain: M54.5

## 2018-05-10 HISTORY — DX: Acute myocardial infarction, unspecified: I21.9

## 2018-05-10 LAB — CBC WITH DIFFERENTIAL/PLATELET
Basophils Absolute: 0 10*3/uL (ref 0.0–0.1)
Basophils Relative: 0 %
Eosinophils Absolute: 0.1 10*3/uL (ref 0.0–0.5)
Eosinophils Relative: 2 %
HCT: 31.1 % — ABNORMAL LOW (ref 36.0–46.0)
Hemoglobin: 9.7 g/dL — ABNORMAL LOW (ref 12.0–15.0)
Lymphocytes Relative: 44 %
Lymphs Abs: 2.6 10*3/uL (ref 0.7–4.0)
MCH: 27.7 pg (ref 26.0–34.0)
MCHC: 31.2 g/dL (ref 30.0–36.0)
MCV: 88.9 fL (ref 80.0–100.0)
MONO ABS: 0.3 10*3/uL (ref 0.1–1.0)
Monocytes Relative: 5 %
Neutro Abs: 2.9 10*3/uL (ref 1.7–7.7)
Neutrophils Relative %: 49 %
Platelets: 207 10*3/uL (ref 150–400)
RBC: 3.5 MIL/uL — ABNORMAL LOW (ref 3.87–5.11)
RDW: 14.7 % (ref 11.5–15.5)
WBC Morphology: ABNORMAL
WBC: 5.9 10*3/uL (ref 4.0–10.5)
nRBC: 0 % (ref 0.0–0.2)

## 2018-05-10 LAB — GLUCOSE, CAPILLARY: GLUCOSE-CAPILLARY: 96 mg/dL (ref 70–99)

## 2018-05-10 LAB — CREATININE, SERUM
Creatinine, Ser: 1.69 mg/dL — ABNORMAL HIGH (ref 0.44–1.00)
GFR calc Af Amer: 37 mL/min — ABNORMAL LOW (ref >60)
GFR calc non Af Amer: 32 mL/min — ABNORMAL LOW (ref >60)

## 2018-05-10 LAB — BRAIN NATRIURETIC PEPTIDE: B Natriuretic Peptide: 566.4 pg/mL — ABNORMAL HIGH (ref 0.0–100.0)

## 2018-05-10 MED ORDER — ADULT MULTIVITAMIN W/MINERALS CH
1.0000 | ORAL_TABLET | Freq: Every day | ORAL | Status: DC
  Administered 2018-05-11 – 2018-05-12 (×2): 1 via ORAL
  Filled 2018-05-10 (×2): qty 1

## 2018-05-10 MED ORDER — SODIUM CHLORIDE 0.9 % IV SOLN: 250.0000 mL | INTRAVENOUS | Status: DC | PRN

## 2018-05-10 MED ORDER — CARVEDILOL 25 MG PO TABS: 25.0000 mg | ORAL_TABLET | Freq: Two times a day (BID) | ORAL | 3 refills | Status: DC

## 2018-05-10 MED ORDER — VITAMIN D 25 MCG (1000 UNIT) PO TABS
5000.0000 [IU] | ORAL_TABLET | Freq: Every day | ORAL | Status: DC
  Administered 2018-05-11 – 2018-05-12 (×2): 5000 [IU] via ORAL
  Filled 2018-05-10 (×2): qty 5

## 2018-05-10 MED ORDER — ASPIRIN EC 81 MG PO TBEC
81.0000 mg | DELAYED_RELEASE_TABLET | Freq: Every day | ORAL | Status: DC
  Administered 2018-05-10 – 2018-05-12 (×3): 81 mg via ORAL
  Filled 2018-05-10 (×3): qty 1

## 2018-05-10 MED ORDER — LINAGLIPTIN 5 MG PO TABS
5.0000 mg | ORAL_TABLET | Freq: Every day | ORAL | Status: DC
  Administered 2018-05-11 – 2018-05-12 (×2): 5 mg via ORAL
  Filled 2018-05-10 (×2): qty 1

## 2018-05-10 MED ORDER — MULTIVITAMIN GUMMIES ADULT PO CHEW: 2.0000 | CHEWABLE_TABLET | Freq: Every day | ORAL | Status: DC

## 2018-05-10 MED ORDER — INSULIN DETEMIR 100 UNIT/ML ~~LOC~~ SOLN
15.0000 [IU] | Freq: Every day | SUBCUTANEOUS | Status: DC
  Administered 2018-05-10 – 2018-05-11 (×2): 15 [IU] via SUBCUTANEOUS
  Filled 2018-05-10 (×3): qty 0.15

## 2018-05-10 MED ORDER — INSULIN ASPART 100 UNIT/ML ~~LOC~~ SOLN
0.0000 [IU] | Freq: Three times a day (TID) | SUBCUTANEOUS | Status: DC
  Administered 2018-05-11: 8 [IU] via SUBCUTANEOUS

## 2018-05-10 MED ORDER — ALBUTEROL SULFATE (2.5 MG/3ML) 0.083% IN NEBU: 3.0000 mL | INHALATION_SOLUTION | Freq: Four times a day (QID) | RESPIRATORY_TRACT | Status: DC | PRN

## 2018-05-10 MED ORDER — ALBUTEROL SULFATE HFA 108 (90 BASE) MCG/ACT IN AERS: 2.0000 | INHALATION_SPRAY | Freq: Four times a day (QID) | RESPIRATORY_TRACT | 0 refills | Status: DC | PRN

## 2018-05-10 MED ORDER — DIPHENHYDRAMINE HCL 25 MG PO CAPS: 25.0000 mg | ORAL_CAPSULE | Freq: Every evening | ORAL | Status: DC | PRN

## 2018-05-10 MED ORDER — ZOLPIDEM TARTRATE 5 MG PO TABS: 5.0000 mg | ORAL_TABLET | Freq: Every evening | ORAL | Status: DC | PRN

## 2018-05-10 MED ORDER — ALPRAZOLAM 0.25 MG PO TABS: 0.2500 mg | ORAL_TABLET | Freq: Two times a day (BID) | ORAL | Status: DC | PRN

## 2018-05-10 MED ORDER — ACETAMINOPHEN 325 MG PO TABS: 650.0000 mg | ORAL_TABLET | ORAL | Status: DC | PRN

## 2018-05-10 MED ORDER — ALBUTEROL SULFATE HFA 108 (90 BASE) MCG/ACT IN AERS: 2.0000 | INHALATION_SPRAY | RESPIRATORY_TRACT | 2 refills | Status: DC | PRN

## 2018-05-10 MED ORDER — FUROSEMIDE 40 MG PO TABS: 40.0000 mg | ORAL_TABLET | Freq: Every day | ORAL | 3 refills | Status: DC

## 2018-05-10 MED ORDER — LOSARTAN POTASSIUM 50 MG PO TABS
100.0000 mg | ORAL_TABLET | Freq: Every day | ORAL | Status: DC
  Administered 2018-05-10 – 2018-05-12 (×3): 100 mg via ORAL
  Filled 2018-05-10 (×3): qty 2

## 2018-05-10 MED ORDER — OSELTAMIVIR PHOSPHATE 30 MG PO CAPS: 30.0000 mg | ORAL_CAPSULE | Freq: Two times a day (BID) | ORAL | 0 refills | Status: DC

## 2018-05-10 MED ORDER — ONDANSETRON HCL 4 MG/2ML IJ SOLN: 4.0000 mg | Freq: Four times a day (QID) | INTRAMUSCULAR | Status: DC | PRN

## 2018-05-10 MED ORDER — OSELTAMIVIR PHOSPHATE 30 MG PO CAPS
30.0000 mg | ORAL_CAPSULE | Freq: Two times a day (BID) | ORAL | Status: DC
  Administered 2018-05-10: 30 mg via ORAL
  Filled 2018-05-10: qty 1

## 2018-05-10 MED ORDER — POTASSIUM CHLORIDE CRYS ER 20 MEQ PO TBCR
20.0000 meq | EXTENDED_RELEASE_TABLET | Freq: Every day | ORAL | Status: DC
  Administered 2018-05-11 – 2018-05-12 (×2): 20 meq via ORAL
  Filled 2018-05-10 (×2): qty 1

## 2018-05-10 MED ORDER — CARVEDILOL 25 MG PO TABS
25.0000 mg | ORAL_TABLET | Freq: Two times a day (BID) | ORAL | Status: DC
  Administered 2018-05-10 – 2018-05-12 (×4): 25 mg via ORAL
  Filled 2018-05-10 (×4): qty 1

## 2018-05-10 MED ORDER — FAMOTIDINE 20 MG PO TABS: 20.0000 mg | ORAL_TABLET | Freq: Every day | ORAL | Status: DC | PRN

## 2018-05-10 MED ORDER — DIPHENHYDRAMINE HCL (SLEEP) 25 MG PO CAPS: 50.0000 mg | ORAL_CAPSULE | Freq: Every evening | ORAL | Status: DC | PRN

## 2018-05-10 MED ORDER — ISOSORBIDE MONONITRATE ER 60 MG PO TB24: 60.0000 mg | ORAL_TABLET | Freq: Every day | ORAL | 3 refills | Status: DC

## 2018-05-10 MED ORDER — SODIUM CHLORIDE 0.9% FLUSH: 3.0000 mL | INTRAVENOUS | Status: DC | PRN

## 2018-05-10 MED ORDER — NITROGLYCERIN 0.4 MG SL SUBL: 0.4000 mg | SUBLINGUAL_TABLET | SUBLINGUAL | Status: DC | PRN

## 2018-05-10 MED ORDER — ENOXAPARIN SODIUM 40 MG/0.4ML ~~LOC~~ SOLN
40.0000 mg | SUBCUTANEOUS | Status: DC
  Administered 2018-05-10 – 2018-05-11 (×2): 40 mg via SUBCUTANEOUS
  Filled 2018-05-10 (×2): qty 0.4

## 2018-05-10 MED ORDER — POTASSIUM CHLORIDE CRYS ER 20 MEQ PO TBCR: 20.0000 meq | EXTENDED_RELEASE_TABLET | Freq: Every day | ORAL | 3 refills | Status: DC

## 2018-05-10 MED ORDER — SODIUM CHLORIDE 0.9% FLUSH
3.0000 mL | Freq: Two times a day (BID) | INTRAVENOUS | Status: DC
  Administered 2018-05-10 – 2018-05-12 (×4): 3 mL via INTRAVENOUS

## 2018-05-10 MED ORDER — ATORVASTATIN CALCIUM 40 MG PO TABS
40.0000 mg | ORAL_TABLET | Freq: Every day | ORAL | Status: DC
  Administered 2018-05-10 – 2018-05-12 (×3): 40 mg via ORAL
  Filled 2018-05-10 (×3): qty 1

## 2018-05-10 MED ORDER — METFORMIN HCL 500 MG PO TABS
1000.0000 mg | ORAL_TABLET | Freq: Two times a day (BID) | ORAL | Status: DC
  Administered 2018-05-11 – 2018-05-12 (×3): 1000 mg via ORAL
  Filled 2018-05-10 (×3): qty 2

## 2018-05-10 MED ORDER — ISOSORBIDE MONONITRATE ER 60 MG PO TB24
60.0000 mg | ORAL_TABLET | Freq: Every day | ORAL | Status: DC
  Administered 2018-05-10 – 2018-05-12 (×3): 60 mg via ORAL
  Filled 2018-05-10 (×3): qty 1

## 2018-05-10 MED ORDER — SITAGLIPTIN PHOS-METFORMIN HCL 50-1000 MG PO TABS: 1.0000 | ORAL_TABLET | Freq: Two times a day (BID) | ORAL | Status: DC

## 2018-05-10 MED FILL — PROAIR HFA 90 MCG INHALER: 108 (90 BAS | 25 days supply | Qty: 9 | Fill #0

## 2018-05-10 NOTE — Telephone Encounter (Signed)
New Message:   Patient calling concerning some medication. Please call patient back.

## 2018-05-10 NOTE — Progress Notes (Signed)
Patient arrived to unit, a little SHOB. Will get oxygen and continue to monitor. MD paged about arrival.

## 2018-05-10 NOTE — Telephone Encounter (Signed)
  Patient needs all of her medications moved to the [facility] Dept so that she can get them filled. She stated that she had previously discussed this with the nurse. She would like to know if they have been sent yet or if she can be called when they are so she can get them.

## 2018-05-10 NOTE — Telephone Encounter (Signed)
Reading through the chart seems that the patient just had a left and right heart catheterization and is being evaluated by cardiology to see if nitrates may be helpful for her underlying disease process.  This looks like it is a screening colonoscopy question and in the setting of recent medications and her current catheterization status I think it is best that we see the patient in clinic and determine with her and subsequently with her outpatient providers if she is a good candidate for screening at this point in time.  With the addition of a new nitrate to her medical regimen I would not plan on having a direct procedure.  It looks like she will be seeing cardiology in the next few weeks.  I would let her be seen by cardiology and then follow-up with her primary care provider.  We should set her up for a clinic visit with myself or with 1 of the PAs in February so that we can determine potential timing for screening colonoscopy and/or if she remains a candidate even for that.  I am happy to then do her in the hospital at any time point if it is felt that she is a candidate.  Justice Britain, MD [facility] Office # [phone]

## 2018-05-10 NOTE — Telephone Encounter (Signed)
Hi [name],  Here's another Echo I'd like you to look at. Her EF 30-35%. It seems to fluctuate between studies from 20% to 65%.  Is she ok for LEC?  Thanks, Meg Huffman Pevisit

## 2018-05-10 NOTE — Telephone Encounter (Signed)
Meg,  This pt does not qualify for anesthetic care at [facility].  She suffers from severe LV dysfunction, EF <35%, consequently her procedure will need to be performed in the hospital setting.  Thanks for your vigilance,  Osvaldo Angst

## 2018-05-10 NOTE — Telephone Encounter (Signed)
Contacted patient about canceling pre visit and procedure. Scheduled an appointment with Dr. Rush Landmark per his note for an OV on February 13. This is after she will have seen her cardiologist and PCP. Patietn asked for me to send a letter with appointment information.  Delsa Bern RN

## 2018-05-10 NOTE — H&P (Signed)
History and Physical   Date:  05/10/2018   ID:  [name], DOB 09-Sep-1955, MRN [id number]  PCP:  Marty Heck, DO  Cardiologist:  Dr. Tamala Julian       Chief Complaint  Patient presents with  . Congestive Heart Failure  . Coronary Artery Disease      History of Present Illness: [name] is a 63 y.o. female who presents for post cath follow up. With significant CAD.    She hasa hx of HTN, DM, PVD, chronic diastolic heart failure, prior history of low EF in 2015 of 30-35%, improved EF in 2018, and history of abnormal NST but no hx of cardiac cath. She was admitted 06/01/17-06/05/17 for influenza A. Echo that admission showed reduced LVEF of 25-30%. He also had chest pain prior to discharge and was restarted on a lower dose of losartan 50 mg (was on 100 mg). Her discharge weight was 167 lbs. Nuclear stress test on 10/2016 without ischemia.  Admitted 12/5 and discharged 12/7 with acute hypoxic resp failure with acute HF  Echo with EF 30-35%, diffuse hypokinesis in inf myocardium. G1DD. LA severely dilated. EKG with LBBB new and troponins flat 0.05  Echo 08/2017 with EF 60-65% G2DD. LA mildly dilated.She did have EF at 30% in 2015.   Since dec discharge she is feeling better. She had no lower ext edema but increased abd. Girth - She was having to prop herself up at night to sleep. Now she does not . In the hospital she did have some chest pressure. But none at home.  With her symptoms and risk factors for CAD and decrease in EF she went for cardiac cath.    Pt had a cardiac cath  Heartburn  Probable anginal equivukent  Per Dr. Tamala Julian "Severe diffuse coronary disease. If up titration of medical therapy does not relieve exertional "indigestion", may need to refer for surgical opinion concerning CABG. Reluctant at this point because LAD is not severely involved. Circumflex cannot be completely revascularized with PCI."   Pt with flu recently.  Seen on  the 6th by PCP. She was unable to afford the tamiflu -now with increased SOB, has actually been SOB since cath.  Can hardly walk in her house without SOB.  Now with congested cough.  Has to sit up to breathe at night. No inhalers.  No fevers, she had those last week and none since.  No chest pain but still with indigestion, though some better.    The imdur she believes makes her dizzy at times, but she is willing to keep taking, "not that bad" We reviewed her cath report including drawing.     Past Medical History:  Diagnosis Date  . Arthritis   . Arthritis of left hip 08/18/2013   S/p total hip arthroplasty.    . CHF (congestive heart failure) ([place])    2014...@ [facility]  . Diabetes mellitus    diagnosed 2001  . Healthcare maintenance 06/16/2015  . Hypertension   . Influenza A 06/01/2017  . Influenza, pneumonia 06/01/2017  . Left flank pain 10/11/2016  . Problem with sexual relationship 10/13/2017  . PVD (peripheral vascular disease) ([place])          Past Surgical History:  Procedure Laterality Date  . ABDOMINAL ANGIOGRAM  12/28/2012  . ABDOMINAL ANGIOGRAM N/A 12/29/2011   Procedure: ABDOMINAL ANGIOGRAM;  Surgeon: Conrad Big Lake, MD;  Location: [facility] CATH LAB;  Service: Cardiovascular;  Laterality: N/A;  . ABDOMINAL AORTAGRAM  N/A 12/21/2012   Procedure: ABDOMINAL Maxcine Ham;  Surgeon: Elam Dutch, MD;  Location: [facility] CATH LAB;  Service: Cardiovascular;  Laterality: N/A;  . RIGHT/LEFT HEART CATH AND CORONARY ANGIOGRAPHY N/A 04/30/2018   Procedure: RIGHT/LEFT HEART CATH AND CORONARY ANGIOGRAPHY;  Surgeon: [name], MD;  Location: [place] CV LAB;  Service: Cardiovascular;  Laterality: N/A;  . TOTAL HIP ARTHROPLASTY Left 08/19/2013   Procedure: TOTAL HIP ARTHROPLASTY;  Surgeon: Kerin Salen, MD;  Location: [place];  Service: Orthopedics;  Laterality: Left;  . TUBAL LIGATION             Current Outpatient Medications  Medication Sig Dispense Refill  . aspirin EC 81  MG tablet Take 1 tablet (81 mg total) by mouth daily. IM program, hope fund 30 tablet 11  . atorvastatin (LIPITOR) 40 MG tablet Take 1 tablet (40 mg total) by mouth daily. 90 tablet 0  . blood glucose meter kit and supplies KIT Dispense based on patient and insurance preference. Use up to four times daily as directed. (FOR ICD-9 250.00, 250.01). 1 each 0  . carvedilol (COREG) 25 MG tablet Take 1 tablet (25 mg total) by mouth 2 (two) times daily. 180 tablet 3  . Cholecalciferol (VITAMIN D3) 125 MCG (5000 UT) CAPS Take 5,000 Units by mouth daily.    . diphenhydrAMINE (BENADRYL) 25 MG tablet Take 25 mg by mouth daily as needed for allergies.    . diphenhydrAMINE HCl, Sleep, (ZZZQUIL) 25 MG CAPS Take 50 mg by mouth at bedtime as needed (sleep).    . famotidine (PEPCID) 20 MG tablet Take 20 mg by mouth daily as needed for heartburn or indigestion.    . Insulin Detemir (LEVEMIR FLEXTOUCH) 100 UNIT/ML Pen Inject 15 Units into the skin at bedtime. 15 mL 0  . isosorbide mononitrate (IMDUR) 60 MG 24 hr tablet Take 1 tablet (60 mg total) by mouth daily. 90 tablet 3  . losartan (COZAAR) 100 MG tablet Take 1 tablet (100 mg total) by mouth daily. 30 tablet 3  . Multiple Vitamins-Minerals (MULTIVITAMIN GUMMIES ADULT) CHEW Chew 2 tablets by mouth daily.    . sitaGLIPtin-metformin (JANUMET) 50-1000 MG tablet Take 1 tablet by mouth 2 (two) times daily with a meal. 60 tablet 6   No current facility-administered medications for this visit.     Allergies:   Lisinopril and Plavix [clopidogrel bisulfate]    Social History:  The patient  reports that she quit smoking about 31 years ago. She has never used smokeless tobacco. She reports that she does not drink alcohol or use drugs.   Family History:  The patient's family history includes Diabetes in her mother.    ROS:  General:no colds or fevers, no weight changes Skin:no rashes or ulcers HEENT:no blurred vision, no congestion CV:see  HPI PUL:see HPI GI:no diarrhea constipation or melena, no indigestion GU:no hematuria, no dysuria MS:no joint pain, no claudication Neuro:no syncope, occ lightheadedness Endo:+ diabetes controlled, no thyroid disease     Wt Readings from Last 3 Encounters:  05/10/18 170 lb 1.9 oz (77.2 kg)  05/07/18 172 lb 11.2 oz (78.3 kg)  04/30/18 177 lb (80.3 kg)     PHYSICAL EXAM: VS:  BP (!) 142/68   Pulse 81   Ht '5\' 3"'  (1.6 m)   Wt 170 lb 1.9 oz (77.2 kg)   SpO2 95%   BMI 30.14 kg/m  , BMI Body mass index is 30.14 kg/m. General:Pleasant affect, NAD- wearing mask Skin:Warm and dry, brisk  capillary refill HEENT:normocephalic, sclera clear, mucus membranes moist Neck:supple, + JVD, no bruits  Heart:S1S2 RRR without murmur, gallup, rub or click Lungs: with rales, rhonchi, and wheezes MHD:QQIW, non tender, + BS, do not palpate liver spleen or masses Ext:no lower ext edema, 2+ pedal pulses, 2+ radial pulses Neuro:alert and oriented X 3, MAE, follows commands, + facial symmetry    EKG:  EKG is NOT ordered today.    Recent Labs: 08/31/2017: ALT 14 04/06/2018: B Natriuretic Peptide 342.3 04/24/2018: Hemoglobin 11.1; Platelets 251 05/03/2018: BUN 27; Creatinine, Ser 1.62; Potassium 4.8; Sodium 143    Lipid Panel Labs(Brief)          Component Value Date/Time   CHOL 149 08/31/2017 0941   TRIG 74 08/31/2017 0941   HDL 60 08/31/2017 0941   CHOLHDL 2.5 08/31/2017 0941   CHOLHDL 3.7 11/08/2012 1331   VLDL 19 11/08/2012 1331   LDLCALC 74 08/31/2017 0941         Other studies Reviewed: Additional studies/ records that were reviewed today include: .  Cardiac cath 04/30/18  Severe diffuse involvement of the circumflex with 70 to 80% proximal to mid stenosis, diffuse 80% and a large second obtuse marginal, and segmental 80% stenosis in the ostial to proximal first obtuse marginal/ramus.  50% ostial LAD with mid vessel eccentric 65 to 70% narrowing.  Chronic  occlusion of the mid RCA fills by left to right collaterals.  Relatively normal pulmonary artery pressures and normal LV EDP.  Echo demonstrates reduced LVEF 30 to 35%.  RECOMMENDATIONS:   Guideline directed therapy for LV systolic dysfunction  Anti-ischemic therapy with long-acting nitrates  Aggressive lipid-lowering  If unable to control symptoms consider referral for surgical revascularization opinion. Reluctant to refer the patient at this time because LAD has only moderate disease. Circumflex territory has multiple diffusely diseased segments as is typical in diabetic patients.    ECHO 04/07/18 Study Conclusions  - Left ventricle: The cavity size was normal. There was moderate concentric hypertrophy. Systolic function was moderately to severely reduced. The estimated ejection fraction was in the range of 30% to 35%. Diffuse hypokinesis worse in the inferior myocardium. Doppler parameters are consistent with abnormal left ventricular relaxation (grade 1 diastolic dysfunction). Doppler parameters are consistent with high ventricular filling pressure. - Aortic valve: Transvalvular velocity was within the normal range. There was no stenosis. There was mild regurgitation. - Mitral valve: Calcified annulus. Mobility of the posterior leaflet was restricted. Transvalvular velocity was within the normal range. There was no evidence for stenosis. There was mild regurgitation. Valve area by continuity equation (using LVOT flow): 1.34 cm^2. - Left atrium: The atrium was severely dilated. - Right ventricle: The cavity size was normal. Wall thickness was normal. Systolic function was normal. - Tricuspid valve: There was trivial regurgitation. - Pulmonary arteries: Systolic pressure was within the normal range. PA peak pressure: 16 mm Hg (S).  Impressions:  - Compared with the echo 01/7988 systolic function is significantly reduced. ASSESSMENT  AND PLAN:  1.  Acute on chronic systolic HF, in combination with flu.  I will check 2 V CXR today, gave coupon for tamiflu and added lasix 40 mg daily along with 20 meq of Kdur.  We discussed if SOB did not improve she should go to ER.   Will see back in 2 weeks for follow up.  2.  Cardiomyopathy, on coreg 25 mg BID, on arb, but she cannot afford entresto now, she is trying to get back on medicaid.  She  is on IMDUR   --spironolactone was held for renal insuff.  Will check with Dr. Tamala Julian.     3.  CAD with significant disease.  Once flu has resolved may need to go to [facility] for consult.  Will decide on 2 week visit.    4.  DM per PCP 5. Chronic LBBB       Chest xray + PNA vs atypical CHF.  Reviewed with Dr. Tamala Julian.  Plan to admit and IV lasix, ABX and nebs.  MD to see there.   Current medicines are reviewed with the patient today.  The patient Has no concerns regarding medicines.  The following changes have been made:  See above Labs/ tests ordered today include:see above  Disposition:   FU:  see above  Signed, Cecilie Kicks, NP  05/10/2018 9:04 AM    [facility], [place], [place], [place] Phone: [phone]; Fax: [phone]    Revision History

## 2018-05-10 NOTE — Telephone Encounter (Signed)
Dr. Rush Landmark,  This patient is not cleared for anesthetic care at [facility] per Osvaldo Angst. Do you want her to a direct to hospital or do you prefer and office visit?  Thank you, Delsa Bern RN- Previsit

## 2018-05-10 NOTE — Patient Instructions (Addendum)
Medication Instructions:  1.) START: Lasix 40 MG daily  2.) START: Potassium Chloride 20 meq daily  3.) START: Albuterol Inhaler 2 puffs every 4 hours as needed for shortness of breath  4.) Tamiflu 30 mg twice a day for 5 days    If you need a refill on your cardiac medications before your next appointment, please call your pharmacy.   Lab work: TODAY: CBC, BMET. PRO BNP  If you have labs (blood work) drawn today and your tests are completely normal, you will receive your results only by: Marland Kitchen MyChart Message (if you have MyChart) OR . A paper copy in the mail If you have any lab test that is abnormal or we need to change your treatment, we will call you to review the results.  Testing/Procedures: A chest x-ray takes a picture of the organs and structures inside the chest, including the heart, lungs, and blood vessels. This test can show several things, including, whether the heart is enlarges; whether fluid is building up in the lungs; and whether pacemaker / defibrillator leads are still in place.    Follow-Up: At [facility], you and your health needs are our priority.  As part of our continuing mission to provide you with exceptional heart care, we have created designated Provider Care Teams.  These Care Teams include your primary Cardiologist (physician) and Advanced Practice Providers (APPs -  Physician Assistants and Nurse Practitioners) who all work together to provide you with the care you need, when you need it. You will need a follow up appointment in 2 weeks.  Please call our office 2 months in advance to schedule this appointment.  You may see Sinclair Grooms, MD or one of the following Advanced Practice Providers on your designated Care Team:   Truitt Merle, NP Cecilie Kicks, NP . Kathyrn Drown, NP  Any Other Special Instructions Will Be Listed Below (If Applicable).

## 2018-05-10 NOTE — Telephone Encounter (Signed)
Pt's medications were sent to pt's pharmacy as requested. Confirmation received.  

## 2018-05-11 DIAGNOSIS — J121 Respiratory syncytial virus pneumonia: Secondary | ICD-10-CM | POA: Diagnosis present

## 2018-05-11 LAB — BASIC METABOLIC PANEL
Anion gap: 8 (ref 5–15)
BUN/Creatinine Ratio: 16 (ref 12–28)
BUN: 21 mg/dL (ref 8–27)
BUN: 23 mg/dL (ref 8–23)
CO2: 21 mmol/L (ref 20–29)
CO2: 24 mmol/L (ref 22–32)
Calcium: 9.6 mg/dL (ref 8.7–10.3)
Calcium: 8.6 mg/dL — ABNORMAL LOW (ref 8.9–10.3)
Chloride: 104 mmol/L (ref 96–106)
Chloride: 108 mmol/L (ref 98–111)
Creatinine, Ser: 1.34 mg/dL — ABNORMAL HIGH (ref 0.57–1.00)
Creatinine, Ser: 1.56 mg/dL — ABNORMAL HIGH (ref 0.44–1.00)
GFR calc Af Amer: 49 mL/min/{1.73_m2} — ABNORMAL LOW (ref >59)
GFR calc non Af Amer: 43 mL/min/{1.73_m2} — ABNORMAL LOW (ref >59)
GFR, EST AFRICAN AMERICAN: 41 mL/min — AB (ref >60)
GFR, EST NON AFRICAN AMERICAN: 35 mL/min — AB (ref >60)
Glucose, Bld: 85 mg/dL (ref 70–99)
Glucose: 88 mg/dL (ref 65–99)
Potassium: 4.3 mmol/L (ref 3.5–5.2)
Potassium: 3.4 mmol/L — ABNORMAL LOW (ref 3.5–5.1)
SODIUM: 140 mmol/L (ref 135–145)
Sodium: 143 mmol/L (ref 134–144)

## 2018-05-11 LAB — CBC
Hematocrit: 30.9 % — ABNORMAL LOW (ref 34.0–46.6)
Hemoglobin: 10.1 g/dL — ABNORMAL LOW (ref 11.1–15.9)
MCH: 28.9 pg (ref 26.6–33.0)
MCHC: 32.7 g/dL (ref 31.5–35.7)
MCV: 89 fL (ref 79–97)
PLATELETS: 225 10*3/uL (ref 150–450)
RBC: 3.49 x10E6/uL — ABNORMAL LOW (ref 3.77–5.28)
RDW: 14.2 % (ref 11.7–15.4)
WBC: 6.6 10*3/uL (ref 3.4–10.8)

## 2018-05-11 LAB — RESPIRATORY PANEL BY PCR
Adenovirus: NOT DETECTED
Bordetella pertussis: NOT DETECTED
CORONAVIRUS 229E-RVPPCR: NOT DETECTED
Chlamydophila pneumoniae: NOT DETECTED
Coronavirus HKU1: NOT DETECTED
Coronavirus NL63: NOT DETECTED
Coronavirus OC43: NOT DETECTED
Influenza A: NOT DETECTED
Influenza B: NOT DETECTED
Metapneumovirus: NOT DETECTED
Mycoplasma pneumoniae: NOT DETECTED
Parainfluenza Virus 1: NOT DETECTED
Parainfluenza Virus 2: NOT DETECTED
Parainfluenza Virus 3: NOT DETECTED
Parainfluenza Virus 4: NOT DETECTED
Respiratory Syncytial Virus: DETECTED — AB
Rhinovirus / Enterovirus: NOT DETECTED

## 2018-05-11 LAB — GLUCOSE, CAPILLARY
GLUCOSE-CAPILLARY: 160 mg/dL — AB (ref 70–99)
Glucose-Capillary: 82 mg/dL (ref 70–99)
Glucose-Capillary: 260 mg/dL — ABNORMAL HIGH (ref 70–99)
Glucose-Capillary: 67 mg/dL — ABNORMAL LOW (ref 70–99)
Glucose-Capillary: 71 mg/dL (ref 70–99)

## 2018-05-11 LAB — PRO B NATRIURETIC PEPTIDE: NT-Pro BNP: 2478 pg/mL — ABNORMAL HIGH (ref 0–287)

## 2018-05-11 MED ORDER — GUAIFENESIN-DM 100-10 MG/5ML PO SYRP
5.0000 mL | ORAL_SOLUTION | ORAL | Status: DC | PRN
  Administered 2018-05-11 – 2018-05-12 (×2): 5 mL via ORAL
  Filled 2018-05-11 (×2): qty 5

## 2018-05-11 MED ORDER — FUROSEMIDE 10 MG/ML IJ SOLN
40.0000 mg | Freq: Once | INTRAMUSCULAR | Status: AC
  Administered 2018-05-11: 40 mg via INTRAVENOUS
  Filled 2018-05-11: qty 4

## 2018-05-11 MED ORDER — FUROSEMIDE 40 MG PO TABS
40.0000 mg | ORAL_TABLET | Freq: Every day | ORAL | Status: DC
  Administered 2018-05-12: 40 mg via ORAL
  Filled 2018-05-11: qty 1

## 2018-05-11 NOTE — Progress Notes (Signed)
DAILY PROGRESS NOTE   Patient Name: [name] Date of Encounter: 05/11/2018  Chief Complaint   Short of breath  Patient Profile   63 yo female with multivessel CAD (not revascularized), ICM and EF 30-35%, presents with acute dyspnea and possible influenza versus decompensated heart failure.  Subjective   Remains dyspneic - cough, wheeze. BNP elevated at 566 - CXR suggestive of focal consolidation, likely pneumonia. No leukocytosis. On empiric tamiflu - respiratory viral panel negative for influenza, however RSV detected. No recorded diuresis overnight. Creatinine up and down. Mild hypokalemia today at 3.4.  Objective   Vitals:   05/10/18 1946 05/11/18 0010 05/11/18 0531 05/11/18 0942  BP: (!) 156/76 (!) 152/71 136/69 (!) 145/72  Pulse: 78 73 68 67  Resp: 16 20 20 18   Temp: 98.6 F (37 C) 98.5 F (36.9 C) 98.6 F (37 C)   TempSrc: Oral Oral Oral   SpO2: 100% 96% 95% 98%  Weight:   72.5 kg   Height:        Intake/Output Summary (Last 24 hours) at 05/11/2018 1005 Last data filed at 05/11/2018 0600 Gross per 24 hour  Intake 360 ml  Output -  Net 360 ml   Filed Weights   05/10/18 1850 05/11/18 0531  Weight: 77.3 kg 72.5 kg    Physical Exam   General appearance: alert and no distress Neck: JVD - 2 cm above sternal notch, no carotid bruit and thyroid not enlarged, symmetric, no tenderness/mass/nodules Lungs: diminished breath sounds bilaterally and wheezes bilaterally Heart: regular rate and rhythm Abdomen: soft, non-tender; bowel sounds normal; no masses,  no organomegaly and obese Extremities: extremities normal, atraumatic, no cyanosis or edema Pulses: 2+ and symmetric Skin: Skin color, texture, turgor normal. No rashes or lesions Neurologic: Grossly normal Psych: Pleasant  Inpatient Medications    Scheduled Meds: . aspirin EC  81 mg Oral Daily  . atorvastatin  40 mg Oral Daily  . carvedilol  25 mg Oral BID  . cholecalciferol  5,000 Units Oral Daily    . enoxaparin (LOVENOX) injection  40 mg Subcutaneous Q24H  . insulin aspart  0-15 Units Subcutaneous TID WC  . insulin detemir  15 Units Subcutaneous QHS  . isosorbide mononitrate  60 mg Oral Daily  . linagliptin  5 mg Oral Daily  . losartan  100 mg Oral Daily  . metFORMIN  1,000 mg Oral BID WC  . multivitamin with minerals  1 tablet Oral Daily  . oseltamivir  30 mg Oral BID  . potassium chloride SA  20 mEq Oral Daily  . sodium chloride flush  3 mL Intravenous Q12H    Continuous Infusions: . sodium chloride      PRN Meds: sodium chloride, acetaminophen, albuterol, ALPRAZolam, diphenhydrAMINE, famotidine, guaiFENesin-dextromethorphan, nitroGLYCERIN, ondansetron (ZOFRAN) IV, sodium chloride flush, zolpidem   Labs   Results for orders placed or performed during the hospital encounter of 05/10/18 (from the past 48 hour(s))  Creatinine, serum     Status: Abnormal   Collection Time: 05/10/18  8:50 PM  Result Value Ref Range   Creatinine, Ser 1.69 (H) 0.44 - 1.00 mg/dL   GFR calc non Af Amer 32 (L) >60 mL/min   GFR calc Af Amer 37 (L) >60 mL/min    Comment: Performed at [facility], [place] [address] [place], [place] [address]  CBC WITH DIFFERENTIAL     Status: Abnormal   Collection Time: 05/10/18  8:50 PM  Result Value Ref Range   WBC  5.9 4.0 - 10.5 K/uL   RBC 3.50 (L) 3.87 - 5.11 MIL/uL   Hemoglobin 9.7 (L) 12.0 - 15.0 g/dL   HCT 31.1 (L) 36.0 - 46.0 %   MCV 88.9 80.0 - 100.0 fL   MCH 27.7 26.0 - 34.0 pg   MCHC 31.2 30.0 - 36.0 g/dL   RDW 14.7 11.5 - 15.5 %   Platelets 207 150 - 400 K/uL   nRBC 0.0 0.0 - 0.2 %   Neutrophils Relative % 49 %   Neutro Abs 2.9 1.7 - 7.7 K/uL   Lymphocytes Relative 44 %   Lymphs Abs 2.6 0.7 - 4.0 K/uL   Monocytes Relative 5 %   Monocytes Absolute 0.3 0.1 - 1.0 K/uL   Eosinophils Relative 2 %   Eosinophils Absolute 0.1 0.0 - 0.5 K/uL   Basophils Relative 0 %   Basophils Absolute 0.0 0.0 - 0.1 K/uL   WBC Morphology Abnormal lymphocytes  present    RBC Morphology POLYCHROMASIA PRESENT     Comment: BURR CELLS Performed at [facility] [address], [place], [place] [address]   Brain natriuretic peptide     Status: Abnormal   Collection Time: 05/10/18  8:50 PM  Result Value Ref Range   B Natriuretic Peptide 566.4 (H) 0.0 - 100.0 pg/mL    Comment: Performed at [facility] [address] [place], [place] [address]  Glucose, capillary     Status: None   Collection Time: 05/10/18  9:23 PM  Result Value Ref Range   Glucose-Capillary 96 70 - 99 mg/dL   Comment 1 Notify RN    Comment 2 Document in Chart   Respiratory Panel by PCR     Status: Abnormal   Collection Time: 05/10/18  9:50 PM  Result Value Ref Range   Adenovirus NOT DETECTED NOT DETECTED   Coronavirus 229E NOT DETECTED NOT DETECTED   Coronavirus HKU1 NOT DETECTED NOT DETECTED   Coronavirus NL63 NOT DETECTED NOT DETECTED   Coronavirus OC43 NOT DETECTED NOT DETECTED   Metapneumovirus NOT DETECTED NOT DETECTED   Rhinovirus / Enterovirus NOT DETECTED NOT DETECTED   Influenza A NOT DETECTED NOT DETECTED   Influenza B NOT DETECTED NOT DETECTED   Parainfluenza Virus 1 NOT DETECTED NOT DETECTED   Parainfluenza Virus 2 NOT DETECTED NOT DETECTED   Parainfluenza Virus 3 NOT DETECTED NOT DETECTED   Parainfluenza Virus 4 NOT DETECTED NOT DETECTED   Respiratory Syncytial Virus DETECTED (A) NOT DETECTED    Comment: CRITICAL RESULT CALLED TO, READ BACK BY AND VERIFIED WITH: R COLUMBRES RN 0012 05/11/18 A BROWNING    Bordetella pertussis NOT DETECTED NOT DETECTED   Chlamydophila pneumoniae NOT DETECTED NOT DETECTED   Mycoplasma pneumoniae NOT DETECTED NOT DETECTED    Comment: Performed at [facility], [place] [address] [place], [place] [address]  Basic metabolic panel     Status: Abnormal   Collection Time: 05/11/18  5:26 AM  Result Value Ref Range   Sodium 140 135 - 145 mmol/L   Potassium 3.4 (L) 3.5 - 5.1 mmol/L   Chloride 108 98 - 111 mmol/L    CO2 24 22 - 32 mmol/L   Glucose, Bld 85 70 - 99 mg/dL   BUN 23 8 - 23 mg/dL   Creatinine, Ser 1.56 (H) 0.44 - 1.00 mg/dL   Calcium 8.6 (L) 8.9 - 10.3 mg/dL   GFR calc non Af Amer 35 (L) >60 mL/min   GFR calc Af  Amer 41 (L) >60 mL/min   Anion gap 8 5 - 15    Comment: Performed at [facility], [place] [address], [place], [place] [address]  Glucose, capillary     Status: None   Collection Time: 05/11/18  8:42 AM  Result Value Ref Range   Glucose-Capillary 82 70 - 99 mg/dL   Comment 1 Notify RN     ECG   N/A  Telemetry   Sinus rhythm - Personally Reviewed  Radiology    Dg Chest 2 View  Result Date: 05/10/2018 CLINICAL DATA:  Cough and fever for 1 week. EXAM: CHEST - 2 VIEW COMPARISON:  04/06/2018 FINDINGS: There is a small area central airspace opacity projecting inferolateral to the right hilum, likely in the right lower lobe. There are linear areas of opacity in the right middle lobe and left upper lobe lingula. These are similar to the prior exam. Remainder of the lungs is clear. No pleural effusion or pneumothorax. Cardiac silhouette is borderline enlarged. No mediastinal or hilar masses. Skeletal structures are intact. IMPRESSION: Small area of airspace opacity projecting in the central right lower lobe consistent with pneumonia. There are other stable linear areas of lower lung opacity that are consistent with atelectasis and/or scarring. Electronically Signed   By: Lajean Manes M.D.   On: 05/10/2018 16:01    Cardiac Studies   N/A  Assessment   Principal Problem:   RSV (respiratory syncytial virus pneumonia) Active Problems:   Acute on chronic combined systolic and diastolic CHF (congestive heart failure) ([place])   Plan   1. Ms. Watters feels a little better today - PCR positive for RSV (influenza negative) - CXR supports RSV pneumonia. I suspect this exacerbated her systolic CHF. Will give additional IV lasix today, resume po lasix tomorrow. Supportive care only - can  d/c tamiflu. No indication for antibiotics (no fever, leukocytosis, clinically improving). Suspect can d/c tomorrow - needs rest.  Time Spent Directly with Patient:  I have spent a total of 25 minutes with the patient reviewing hospital notes, telemetry, EKGs, labs and examining the patient as well as establishing an assessment and plan that was discussed personally with the patient.  > 50% of time was spent in direct patient care.  Length of Stay:  LOS: 1 day   Pixie Casino, MD, [facility], [place] Director of the Advanced Lipid Disorders &  Cardiovascular Risk Reduction Clinic Diplomate of the American Board of Clinical Lipidology Attending Cardiologist  Direct Dial: [phone]  Fax: [phone]  Website:  [email or web address] Osgood Hollister Wessler 05/11/2018, 10:05 AM

## 2018-05-12 LAB — BASIC METABOLIC PANEL WITH GFR
Anion gap: 10 (ref 5–15)
BUN: 28 mg/dL — ABNORMAL HIGH (ref 8–23)
CO2: 25 mmol/L (ref 22–32)
Calcium: 8.9 mg/dL (ref 8.9–10.3)
Chloride: 104 mmol/L (ref 98–111)
Creatinine, Ser: 1.65 mg/dL — ABNORMAL HIGH (ref 0.44–1.00)
GFR calc Af Amer: 38 mL/min — ABNORMAL LOW (ref >60)
GFR calc non Af Amer: 33 mL/min — ABNORMAL LOW (ref >60)
Glucose, Bld: 102 mg/dL — ABNORMAL HIGH (ref 70–99)
Potassium: 4.5 mmol/L (ref 3.5–5.1)
Sodium: 139 mmol/L (ref 135–145)

## 2018-05-12 LAB — GLUCOSE, CAPILLARY
GLUCOSE-CAPILLARY: 111 mg/dL — AB (ref 70–99)
Glucose-Capillary: 126 mg/dL — ABNORMAL HIGH (ref 70–99)
Glucose-Capillary: 149 mg/dL — ABNORMAL HIGH (ref 70–99)

## 2018-05-12 MED ORDER — GUAIFENESIN-DM 100-10 MG/5ML PO SYRP: 5.0000 mL | ORAL_SOLUTION | ORAL | 0 refills | Status: DC | PRN

## 2018-05-12 NOTE — Plan of Care (Signed)
  Problem: Activity: Goal: Capacity to carry out activities will improve Outcome: Progressing   Problem: Cardiac: Goal: Ability to achieve and maintain adequate cardiopulmonary perfusion will improve Outcome: Progressing   

## 2018-05-12 NOTE — Progress Notes (Signed)
Progress Note  Patient Name: [name] Date of Encounter: 05/12/2018  Primary Cardiologist:   Sinclair Grooms, MD   Subjective   She is breathing OK but not back to baseline.    Inpatient Medications    Scheduled Meds: . aspirin EC  81 mg Oral Daily  . atorvastatin  40 mg Oral Daily  . carvedilol  25 mg Oral BID  . cholecalciferol  5,000 Units Oral Daily  . enoxaparin (LOVENOX) injection  40 mg Subcutaneous Q24H  . furosemide  40 mg Oral Daily  . insulin aspart  0-15 Units Subcutaneous TID WC  . insulin detemir  15 Units Subcutaneous QHS  . isosorbide mononitrate  60 mg Oral Daily  . linagliptin  5 mg Oral Daily  . losartan  100 mg Oral Daily  . metFORMIN  1,000 mg Oral BID WC  . multivitamin with minerals  1 tablet Oral Daily  . potassium chloride SA  20 mEq Oral Daily  . sodium chloride flush  3 mL Intravenous Q12H   Continuous Infusions: . sodium chloride     PRN Meds: sodium chloride, acetaminophen, albuterol, ALPRAZolam, diphenhydrAMINE, famotidine, guaiFENesin-dextromethorphan, nitroGLYCERIN, ondansetron (ZOFRAN) IV, sodium chloride flush, zolpidem   Vital Signs    Vitals:   05/11/18 1912 05/12/18 0535 05/12/18 0914 05/12/18 1229  BP: (!) 172/74 (!) 159/60 (!) 171/75 138/67  Pulse: 79 74 68 76  Resp: 18 18 18 20   Temp: 98.2 F (36.8 C) 97.7 F (36.5 C)  98.5 F (36.9 C)  TempSrc: Oral Oral  Oral  SpO2: 99% 95%  93%  Weight:      Height:        Intake/Output Summary (Last 24 hours) at 05/12/2018 1336 Last data filed at 05/12/2018 1100 Gross per 24 hour  Intake 720 ml  Output 2000 ml  Net -1280 ml   Filed Weights   05/10/18 1850 05/11/18 0531  Weight: 77.3 kg 72.5 kg    Telemetry    NSR - Personally Reviewed  ECG    NA - Personally Reviewed  Physical Exam   GEN: No acute distress.   Neck: No  JVD Cardiac: RRR, no murmurs, rubs, or gallops.  Respiratory:  Scattered expiratory wheezing.   GI: Soft, nontender, non-distended    MS: No  edema; No deformity. Neuro:  Nonfocal  Psych: Normal affect   Labs    Chemistry Recent Labs  Lab 05/10/18 0947 05/10/18 2050 05/11/18 0526 05/12/18 0355  NA 143  --  140 139  K 4.3  --  3.4* 4.5  CL 104  --  108 104  CO2 21  --  24 25  GLUCOSE 88  --  85 102*  BUN 21  --  23 28*  CREATININE 1.34* 1.69* 1.56* 1.65*  CALCIUM 9.6  --  8.6* 8.9  GFRNONAA 43* 32* 35* 33*  GFRAA 49* 37* 41* 38*  ANIONGAP  --   --  8 10     Hematology Recent Labs  Lab 05/10/18 0947 05/10/18 2050  WBC 6.6 5.9  RBC 3.49* 3.50*  HGB 10.1* 9.7*  HCT 30.9* 31.1*  MCV 89 88.9  MCH 28.9 27.7  MCHC 32.7 31.2  RDW 14.2 14.7  PLT 225 207    Cardiac EnzymesNo results for input(s): TROPONINI in the last 168 hours. No results for input(s): TROPIPOC in the last 168 hours.   BNP Recent Labs  Lab 05/10/18 0947 05/10/18 2050  BNP  --  566.4*  PROBNP 2,478*  --      DDimer No results for input(s): DDIMER in the last 168 hours.   Radiology    No results found.  Cardiac Studies   NA  Patient Profile     63 y.o. female with multivessel CAD (not revascularized), ICM and EF 30-35%, presents with acute dyspnea and possible influenza versus decompensated heart failure  Assessment & Plan    ACUTE ON CHRONIC SYSTOLIC HF:  Negative 1.2 liters since admission.    RSV PNEUMONIA:  Supportive care.  Ambulate.  If O2 sat OK can go home.     CAD:  No active ischemia.  Medical management.     For questions or updates, please contact [place] Please consult [email or web address] for contact info under Cardiology/STEMI.   Signed, Minus Breeding, MD  05/12/2018, 1:36 PM

## 2018-05-12 NOTE — Discharge Summary (Signed)
Discharge Summary    Patient ID: [name],  MRN: [id number], DOB/AGE: 1955-07-26 63 y.o.  Admit date: 05/10/2018 Discharge date: 05/12/2018  Primary Care Provider: [name] Primary Cardiologist: Sinclair Grooms, MD   Discharge Diagnoses    Principal Problem:   RSV (respiratory syncytial virus pneumonia) Active Problems:   Acute on chronic combined systolic and diastolic CHF (congestive heart failure) (HCC)   Allergies Allergies  Allergen Reactions  . Lisinopril Cough  . Plavix [Clopidogrel Bisulfate] Rash    Diagnostic Studies/Procedures    Dg Chest 2 View  Result Date: 05/10/2018 CLINICAL DATA:  Cough and fever for 1 week. EXAM: CHEST - 2 VIEW COMPARISON:  04/06/2018 FINDINGS: There is a small area central airspace opacity projecting inferolateral to the right hilum, likely in the right lower lobe. There are linear areas of opacity in the right middle lobe and left upper lobe lingula. These are similar to the prior exam. Remainder of the lungs is clear. No pleural effusion or pneumothorax. Cardiac silhouette is borderline enlarged. No mediastinal or hilar masses. Skeletal structures are intact. IMPRESSION: Small area of airspace opacity projecting in the central right lower lobe consistent with pneumonia. There are other stable linear areas of lower lung opacity that are consistent with atelectasis and/or scarring. Electronically Signed   By: Lajean Manes M.D.   On: 05/10/2018 16:01    _____________   History of Present Illness     63 yo female with multivessel CAD (not revascularized), ICM and EF 30-35%, was admitted 05/10/2018 with acute dyspnea and possible influenza versus decompensated heart failure.  Hospital Course     Consultants: none    1 .Acute on chronic combined systolic and diastolic CHF: Her weight was up and her BNP was elevated at 566.  She was given IV Lasix and diuresed 1.2 L.  Her weight trended down and her respiratory status  improved.  She ambulated with staff prior to discharge and her oxygen saturation remained above 92%.  2.  RSV: Her chest x-ray was abnormal.  A viral panel was performed and was positive for respiratory syncytial virus.  She was treated symptomatically and improved.  Her other medical problems were stable and she was continued on her home medications, plus sliding scale insulin for diabetes.  She did not have chest pain.  On 05/12/2018, she was seen by Dr. Percival Spanish and all data were reviewed.  Her oxygen saturations remained good with ambulation and she did not qualify for O2.  Her general medical condition had improved.  No further inpatient work-up is indicated and she is considered stable for discharge, to follow-up as an outpatient.   _____________  Discharge Vitals Blood pressure 138/67, pulse 76, temperature 98.5 F (36.9 C), temperature source Oral, resp. rate 20, height '5\' 3"'  (1.6 m), weight 72.5 kg, SpO2 93 %.  Filed Weights   05/10/18 1850 05/11/18 0531  Weight: 77.3 kg 72.5 kg    Labs & Radiologic Studies    CBC Recent Labs    05/10/18 0947 05/10/18 2050  WBC 6.6 5.9  NEUTROABS  --  2.9  HGB 10.1* 9.7*  HCT 30.9* 31.1*  MCV 89 88.9  PLT 225 967   Basic Metabolic Panel Recent Labs    05/11/18 0526 05/12/18 0355  NA 140 139  K 3.4* 4.5  CL 108 104  CO2 24 25  GLUCOSE 85 102*  BUN 23 28*  CREATININE 1.56* 1.65*  CALCIUM 8.6* 8.9  BNP    Component Value Date/Time   BNP 566.4 (H) 05/10/2018 2050    ProBNP    Component Value Date/Time   PROBNP 2,478 (H) 05/10/2018 0947   PROBNP 4,184.0 (H) 07/07/2013 0008    _____________  Dg Chest 2 View  Result Date: 05/10/2018 CLINICAL DATA:  Cough and fever for 1 week. EXAM: CHEST - 2 VIEW COMPARISON:  04/06/2018 FINDINGS: There is a small area central airspace opacity projecting inferolateral to the right hilum, likely in the right lower lobe. There are linear areas of opacity in the right middle lobe and left  upper lobe lingula. These are similar to the prior exam. Remainder of the lungs is clear. No pleural effusion or pneumothorax. Cardiac silhouette is borderline enlarged. No mediastinal or hilar masses. Skeletal structures are intact. IMPRESSION: Small area of airspace opacity projecting in the central right lower lobe consistent with pneumonia. There are other stable linear areas of lower lung opacity that are consistent with atelectasis and/or scarring. Electronically Signed   By: Lajean Manes M.D.   On: 05/10/2018 16:01   Disposition   Pt is being discharged home today in good condition.  Follow-up Plans & Appointments    Follow-up Information    [name], MD Follow up.   Specialty:  Cardiology Why:  The office will call Contact information: [address] [address] [phone]          Discharge Instructions    (HEART FAILURE PATIENTS) Call MD:  Anytime you have any of the following symptoms: 1) 3 pound weight gain in 24 hours or 5 pounds in 1 week 2) shortness of breath, with or without a dry hacking cough 3) swelling in the hands, feet or stomach 4) if you have to sleep on extra pillows at night in order to breathe.   Complete by:  As directed    Diet - low sodium heart healthy   Complete by:  As directed    Diet Carb Modified   Complete by:  As directed    Increase activity slowly   Complete by:  As directed       Discharge Medications   Allergies as of 05/12/2018      Reactions   Lisinopril Cough   Plavix [clopidogrel Bisulfate] Rash      Medication List    STOP taking these medications   oseltamivir 30 MG capsule Commonly known as:  TAMIFLU     TAKE these medications   albuterol 108 (90 Base) MCG/ACT inhaler Commonly known as:  PROVENTIL HFA Inhale 2 puffs into the lungs every 6 (six) hours as needed for wheezing or shortness of breath.   aspirin EC 81 MG tablet Take 1 tablet (81 mg total) by mouth daily. IM program,  [facility]   atorvastatin 40 MG tablet Commonly known as:  LIPITOR Take 1 tablet (40 mg total) by mouth daily.   blood glucose meter kit and supplies Kit Dispense based on patient and insurance preference. Use up to four times daily as directed. (FOR ICD-9 250.00, 250.01).   carvedilol 25 MG tablet Commonly known as:  COREG Take 1 tablet (25 mg total) by mouth 2 (two) times daily.   diphenhydrAMINE 25 MG tablet Commonly known as:  BENADRYL Take 25 mg by mouth daily as needed for allergies.   famotidine 20 MG tablet Commonly known as:  PEPCID Take 20 mg by mouth daily as needed for heartburn or indigestion.   furosemide 40  MG tablet Commonly known as:  LASIX Take 1 tablet (40 mg total) by mouth daily.   guaiFENesin-dextromethorphan 100-10 MG/5ML syrup Commonly known as:  ROBITUSSIN DM Take 5 mLs by mouth every 4 (four) hours as needed for cough (chest congestion).   Insulin Detemir 100 UNIT/ML Pen Commonly known as:  LEVEMIR FLEXTOUCH Inject 15 Units into the skin at bedtime.   isosorbide mononitrate 60 MG 24 hr tablet Commonly known as:  IMDUR Take 1 tablet (60 mg total) by mouth daily.   losartan 100 MG tablet Commonly known as:  COZAAR Take 1 tablet (100 mg total) by mouth daily.   MULTIVITAMIN GUMMIES ADULT Chew Chew 2 tablets by mouth daily.   potassium chloride SA 20 MEQ tablet Commonly known as:  K-DUR,KLOR-CON Take 1 tablet (20 mEq total) by mouth daily.   sitaGLIPtin-metformin 50-1000 MG tablet Commonly known as:  JANUMET Take 1 tablet by mouth 2 (two) times daily with a meal.   Vitamin D3 125 MCG (5000 UT) Caps Take 5,000 Units by mouth daily.   ZZZQUIL 25 MG Caps Generic drug:  diphenhydrAMINE HCl (Sleep) Take 50 mg by mouth at bedtime as needed (sleep).          Outstanding Labs/Studies   None  Duration of Discharge Encounter   Greater than 30 minutes including physician time.  Alcario Drought Jeovany Huitron NP 05/12/2018, 5:02 PM

## 2018-05-12 NOTE — Progress Notes (Signed)
SATURATION QUALIFICATIONS: (This note is used to comply with regulatory documentation for home oxygen)  Patient Saturations on Room Air at Rest = 100  Patient Saturations on Room Air while Ambulating = 98  Patient Saturations on 0 Liters of oxygen while Ambulating = 98  Please briefly explain why patient needs home oxygen: patient did not complain of shortness of breath with ambulation.

## 2018-05-14 MED FILL — LOSARTAN POTASSIUM 100 MG T: 100 | 30 days supply | Qty: 30 | Fill #2

## 2018-05-18 MED FILL — JANUMET 50-1,000 MG TABLET: 50-1000 | 30 days supply | Qty: 60 | Fill #2

## 2018-05-21 ENCOUNTER — Encounter (INDEPENDENT_AMBULATORY_CARE_PROVIDER_SITE_OTHER): Payer: Self-pay

## 2018-05-21 ENCOUNTER — Ambulatory Visit (INDEPENDENT_AMBULATORY_CARE_PROVIDER_SITE_OTHER): Payer: No Typology Code available for payment source | Admitting: Nurse Practitioner

## 2018-05-21 ENCOUNTER — Encounter: Payer: Self-pay | Admitting: Nurse Practitioner

## 2018-05-21 VITALS — BP 148/80 | HR 70 | Ht 63.0 in | Wt 164.4 lb

## 2018-05-21 DIAGNOSIS — R079 Chest pain, unspecified: Secondary | ICD-10-CM

## 2018-05-21 MED ORDER — NITROGLYCERIN 0.4 MG SL SUBL: 0.4000 mg | SUBLINGUAL_TABLET | SUBLINGUAL | 3 refills | Status: DC | PRN

## 2018-05-21 MED ORDER — ISOSORBIDE MONONITRATE ER 60 MG PO TB24: 90.0000 mg | ORAL_TABLET | Freq: Every day | ORAL | 6 refills | Status: DC

## 2018-05-21 MED FILL — NITROGLYCERIN 0.4 MG TAB SL: 0.4 | 10 days supply | Qty: 25 | Fill #0

## 2018-05-21 NOTE — Patient Instructions (Addendum)
We will be checking the following labs today - BMET, CBC & A1C    Medication Instructions:    Continue with your current medicines. BUT  I am increasing the Isosorbide to 90 mg - this will be a pill and a half I am sending in RX for NTG - Use your NTG under your tongue for recurrent chest pain. May take one tablet every 5 minutes. If you are still having discomfort after 3 tablets in 15 minutes, call 911.   If you need a refill on your cardiac medications before your next appointment, please call your pharmacy.     Testing/Procedures To Be Arranged:  N/A  Follow-Up:   See Dr. Tamala Julian in about 4 weeks  Need to see your PCP about your blood sugar control.     At [facility], you and your health needs are our priority.  As part of our continuing mission to provide you with exceptional heart care, we have created designated Provider Care Teams.  These Care Teams include your primary Cardiologist (physician) and Advanced Practice Providers (APPs -  Physician Assistants and Nurse Practitioners) who all work together to provide you with the care you need, when you need it.  Special Instructions:  . None  Call the [place] office at [phone] if you have any questions, problems or concerns.

## 2018-05-21 NOTE — Progress Notes (Signed)
CARDIOLOGY OFFICE NOTE  Date:  05/21/2018    [name] Date of Birth: 07/13/1955 Medical Record #[id number]  PCP:  Marty Heck, DO  Cardiologist:  Tamala Julian    Chief Complaint  Patient presents with  . Coronary Artery Disease  . Chest Pain    Post hospital visit - seen for Dr. Tamala Julian    History of Present Illness: [name] is a 64 y.o. female who presents today for a 10 day check. Seen for Dr. Tamala Julian.   She hasa hx of HTN, uncontrolled DM, PVD, chronic diastolic heart failure, prior history of low EF in 2015 of 30-35%, improved EF in 2018, and history of abnormal NST but no prior hx of cardiac cath. She was admitted 06/01/17-06/05/17 for influenza A. Echo that admission showed reduced LVEF of 25-30%. She also had chest pain prior to discharge and was restarted on a lower dose of losartan 50 mg (was on 100 mg). Her discharge weight was 167 lbs. Nuclear stress test on 10/2016 without ischemia.  Admitted in December of 2019 with respiratory failure. Echo with EF back down at 30 to 35%. New LBBB. She ended up being cathed due to multiple risk factors and the reduction in her EF. Her heartburn is felt to be her anginal equivalent.   Per Dr. Tamala Julian "Severe diffuse coronary disease. If up titration of medical therapy does not relieve exertional "indigestion", may need to refer for surgical opinion concerning CABG. Reluctant at this point because LAD is not severely involved. Circumflex cannot be completely revascularized with PCI."   When seen for her post cath visit - she was markedly sick with recent flu/congestion - could not afford Tamiflu - - ended up being sent back to the hospital. Cardiology did follow - she did require IV diuresis. She did not have chest pain.   Comes in today. Here with family. Very poor insight into her health. Her orange card is "running out". She has applied for disability and medicaid - still waiting. Can currently get her medicines with the  orange card. She says she has indigestion - points to her lower left abdomen and says that's where she has it. Yesterday she was in her kitchen - cooking mac n cheese - had chest tightness for about 10 minutes - had to go lie down. She does not have NTG on hand. Says she was not aware of her diabetes causing heart disease. Does not see her PCP for a few more weeks. Tells me she was going to have a colonoscopy next month - has never had before - no real Gi problems endorsed to me. She is taking her Imdur a little later and not with her other medicines - this has helped her dizziness.   Past Medical History:  Diagnosis Date  . Arthritis    "hands, shoulders, hips, legs" (05/10/2018)  . Arthritis of left hip 08/18/2013   S/p total hip arthroplasty.    . CHF (congestive heart failure) ([place])    2014...@ [facility]  . Chronic lower back pain   . COPD (chronic obstructive pulmonary disease) ([place])   . Healthcare maintenance 06/16/2015  . High cholesterol   . Hypertension   . Influenza A 06/01/2017  . Influenza, pneumonia 06/01/2017  . Left flank pain 10/11/2016  . Myocardial infarction [facility])    "been told that I've had one; don't know when it would have been" (05/10/2018)  . Problem with sexual relationship 10/13/2017  . PVD (peripheral vascular  disease) ([place])   . Type II diabetes mellitus ([place])    diagnosed 2001    Past Surgical History:  Procedure Laterality Date  . ABDOMINAL ANGIOGRAM  12/28/2012  . ABDOMINAL ANGIOGRAM N/A 12/29/2011   Procedure: ABDOMINAL ANGIOGRAM;  Surgeon: Conrad [place], MD;  Location: [facility] CATH LAB;  Service: Cardiovascular;  Laterality: N/A;  . ABDOMINAL AORTAGRAM N/A 12/21/2012   Procedure: ABDOMINAL Maxcine Ham;  Surgeon: Elam Dutch, MD;  Location: [facility] LAB;  Service: Cardiovascular;  Laterality: N/A;  . EYE SURGERY Bilateral ~ 2018   "laser; related to my diabetes"  . JOINT REPLACEMENT    . PERIPHERAL VASCULAR CATHETERIZATION     "had to have some veins unclogged cause I  was in pain when I walked" (05/10/2018)  . RIGHT/LEFT HEART CATH AND CORONARY ANGIOGRAPHY N/A 04/30/2018   Procedure: RIGHT/LEFT HEART CATH AND CORONARY ANGIOGRAPHY;  Surgeon: [name], MD;  Location: [facility] CV LAB;  Service: Cardiovascular;  Laterality: N/A;  . TOTAL HIP ARTHROPLASTY Left 08/19/2013   Procedure: TOTAL HIP ARTHROPLASTY;  Surgeon: Kerin Salen, MD;  Location: [facility];  Service: Orthopedics;  Laterality: Left;  . TUBAL LIGATION       Medications: Current Meds  Medication Sig  . albuterol (PROVENTIL HFA) 108 (90 Base) MCG/ACT inhaler Inhale 2 puffs into the lungs every 6 (six) hours as needed for wheezing or shortness of breath.  Marland Kitchen aspirin EC 81 MG tablet Take 1 tablet (81 mg total) by mouth daily. IM program, [facility]  . atorvastatin (LIPITOR) 40 MG tablet Take 1 tablet (40 mg total) by mouth daily.  . [name] glucose meter kit and supplies KIT Dispense based on patient and insurance preference. Use up to four times daily as directed. (FOR ICD-9 250.00, 250.01).  . carvedilol (COREG) 25 MG tablet Take 1 tablet (25 mg total) by mouth 2 (two) times daily.  . Cholecalciferol (VITAMIN D3) 125 MCG (5000 UT) CAPS Take 5,000 Units by mouth daily.  . diphenhydrAMINE (BENADRYL) 25 MG tablet Take 25 mg by mouth daily as needed for allergies.  . diphenhydrAMINE HCl, Sleep, (ZZZQUIL) 25 MG CAPS Take 50 mg by mouth at bedtime as needed (sleep).  . famotidine (PEPCID) 20 MG tablet Take 20 mg by mouth daily as needed for heartburn or indigestion.  . furosemide (LASIX) 40 MG tablet Take 1 tablet (40 mg total) by mouth daily.  . Insulin Detemir (LEVEMIR FLEXTOUCH) 100 UNIT/ML Pen Inject 15 Units into the skin at bedtime.  . isosorbide mononitrate (IMDUR) 60 MG 24 hr tablet Take 1.5 tablets (90 mg total) by mouth daily.  Marland Kitchen losartan (COZAAR) 100 MG tablet Take 1 tablet (100 mg total) by mouth daily.  . Multiple Vitamins-Minerals (MULTIVITAMIN GUMMIES ADULT) CHEW Chew 2 tablets by mouth  daily.  . potassium chloride SA (K-DUR,KLOR-CON) 20 MEQ tablet Take 1 tablet (20 mEq total) by mouth daily.  . sitaGLIPtin-metformin (JANUMET) 50-1000 MG tablet Take 1 tablet by mouth 2 (two) times daily with a meal.  . [DISCONTINUED] isosorbide mononitrate (IMDUR) 60 MG 24 hr tablet Take 1 tablet (60 mg total) by mouth daily.     Allergies: Allergies  Allergen Reactions  . Lisinopril Cough  . Plavix [Clopidogrel Bisulfate] Rash    Social History: The patient  reports that she quit smoking about 31 years ago. Her smoking use included cigarettes. She quit after 7.00 years of use. She has never used smokeless tobacco. She reports current alcohol use. She reports previous drug use.  Family History: The patient's family history includes Diabetes in her mother.   Review of Systems: Please see the history of present illness.   Otherwise, the review of systems is positive for none.   All other systems are reviewed and negative.   Physical Exam: VS:  BP (!) 148/80 (BP Location: Left Arm, Patient Position: Sitting, Cuff Size: Normal)   Pulse 70   Ht 5' 3"  (1.6 m)   Wt 164 lb 6.4 oz (74.6 kg)   BMI 29.12 kg/m  .  BMI Body mass index is 29.12 kg/m.  Wt Readings from Last 3 Encounters:  05/21/18 164 lb 6.4 oz (74.6 kg)  05/11/18 159 lb 14.4 oz (72.5 kg)  05/10/18 170 lb 1.9 oz (77.2 kg)    General: Alert. Looks older than her stated age. She is in no acute distress.   HEENT: Normal but with lots of missing teeth.  Neck: Supple, no JVD, carotid bruits, or masses noted.  Cardiac: Regular rate and rhythm. No murmurs, rubs, or gallops. No edema.  Respiratory:  Lungs are clear to auscultation bilaterally with normal work of breathing.  GI: Soft and nontender.  MS: No deformity or atrophy. Gait and ROM intact.  Skin: Warm and dry. Color is normal.  Neuro:  Strength and sensation are intact and no gross focal deficits noted.  Psych: Alert, appropriate and with normal  affect.   LABORATORY DATA:  EKG:  EKG is ordered today. This demonstrates NSR with LBBB.  Lab Results  Component Value Date   WBC 5.9 05/10/2018   HGB 9.7 (L) 05/10/2018   HCT 31.1 (L) 05/10/2018   PLT 207 05/10/2018   GLUCOSE 102 (H) 05/12/2018   CHOL 149 08/31/2017   TRIG 74 08/31/2017   HDL 60 08/31/2017   LDLCALC 74 08/31/2017   ALT 14 08/31/2017   AST 17 08/31/2017   NA 139 05/12/2018   K 4.5 05/12/2018   CL 104 05/12/2018   CREATININE 1.65 (H) 05/12/2018   BUN 28 (H) 05/12/2018   CO2 25 05/12/2018   TSH 0.492 11/08/2012   INR 0.93 08/13/2013   HGBA1C >14.0 (A) 02/28/2018   MICROALBUR 3.18 (H) 02/22/2013     BNP (last 3 results) Recent Labs    06/01/17 0157 04/06/18 0004 05/10/18 2050  BNP 436.4* 342.3* 566.4*    ProBNP (last 3 results) Recent Labs    05/10/18 0947  PROBNP 2,478*     Other Studies Reviewed Today:  Cardiac cath 04/30/18  Severe diffuse involvement of the circumflex with 70 to 80% proximal to mid stenosis, diffuse 80% and a large second obtuse marginal, and segmental 80% stenosis in the ostial to proximal first obtuse marginal/ramus.  50% ostial LAD with mid vessel eccentric 65 to 70% narrowing.  Chronic occlusion of the mid RCA fills by left to right collaterals.  Relatively normal pulmonary artery pressures and normal LV EDP.  Echo demonstrates reduced LVEF 30 to 35%.  RECOMMENDATIONS:   Guideline directed therapy for LV systolic dysfunction  Anti-ischemic therapy with long-acting nitrates  Aggressive lipid-lowering  If unable to control symptoms consider referral for surgical revascularization opinion. Reluctant to refer the patient at this time because LAD has only moderate disease. Circumflex territory has multiple diffusely diseased segments as is typical in diabetic patients.    ECHO 04/07/18 Study Conclusions  - Left ventricle: The cavity size was normal. There was moderate concentric hypertrophy.  Systolic function was moderately to severely reduced. The estimated ejection fraction was in the range  of 30% to 35%. Diffuse hypokinesis worse in the inferior myocardium. Doppler parameters are consistent with abnormal left ventricular relaxation (grade 1 diastolic dysfunction). Doppler parameters are consistent with high ventricular filling pressure. - Aortic valve: Transvalvular velocity was within the normal range. There was no stenosis. There was mild regurgitation. - Mitral valve: Calcified annulus. Mobility of the posterior leaflet was restricted. Transvalvular velocity was within the normal range. There was no evidence for stenosis. There was mild regurgitation. Valve area by continuity equation (using LVOT flow): 1.34 cm^2. - Left atrium: The atrium was severely dilated. - Right ventricle: The cavity size was normal. Wall thickness was normal. Systolic function was normal. - Tricuspid valve: There was trivial regurgitation. - Pulmonary arteries: Systolic pressure was within the normal range. PA peak pressure: 16 mm Hg (S).  Impressions:  - Compared with the echo 08/6810 systolic function is significantly reduced.   ASSESSMENT AND PLAN:  1. CAD - significant disease per recent cath - trying to manage medically. Chest tightness yesterday - not really indigestion (although this is endorsed by the patient in the lower abdomen) may be related to persistent cough - I am increasing Imdur to 90 mg today. Not candidate for Norvasc given LV dysfunction. May need referral to [facility] for surgical options but needs better [name] sugar control. I have sent in RX for NTG - instructed in how to take.   2. Ischemic CM/chronic systolic HF - on Coreg, ARB & diuretic - not able to afford Entresto - already with CKD so would hold on Aldactone.   3. Uncontrolled DM - A1C > 14 at last check - does not seem to understand the need for better [name] sugar control - explained  will need this if surgery becomes an option but will be at greater risk for infection and worsening CKD. I have asked her to go ahead and touch base with her PCP.   4. Recent flu/viral illness - recovering.   5. Chronic LBBB  6. Poor social situation - this will certainly play a role in long term prognosis.   7. CKD - rechecking lab today.   8. Anemia - will need addressing. I do not think she is a candidate at this time for her colonoscopy - I think we need to get her cardiac symptoms settled down or refer on for surgery. This will need to be followed. She denies actual GI issues.   Current medicines are reviewed with the patient today.  The patient does not have concerns regarding medicines other than what has been noted above.  The following changes have been made:  See above.  Labs/ tests ordered today include:    Orders Placed This Encounter  Procedures  . Basic metabolic panel  . CBC  . Hemoglobin A1c  . EKG 12-Lead     Disposition:   FU with Dr. Tamala Julian in about a month. Overall situation tenuous at best.    Patient is agreeable to this plan and will call if any problems develop in the interim.   SignedTruitt Merle, NP  05/21/2018 4:17 PM  [facility] [address] [place], [place]  [address] Phone: [phone] Fax: [phone]

## 2018-05-22 LAB — HEMOGLOBIN A1C
Est. average glucose Bld gHb Est-mCnc: 169 mg/dL
Hgb A1c MFr Bld: 7.5 % — ABNORMAL HIGH (ref 4.8–5.6)

## 2018-05-22 LAB — BASIC METABOLIC PANEL
BUN/Creatinine Ratio: 25 (ref 12–28)
BUN: 41 mg/dL — ABNORMAL HIGH (ref 8–27)
CO2: 19 mmol/L — ABNORMAL LOW (ref 20–29)
Calcium: 9.8 mg/dL (ref 8.7–10.3)
Chloride: 104 mmol/L (ref 96–106)
Creatinine, Ser: 1.65 mg/dL — ABNORMAL HIGH (ref 0.57–1.00)
GFR calc Af Amer: 38 mL/min/{1.73_m2} — ABNORMAL LOW (ref >59)
GFR calc non Af Amer: 33 mL/min/{1.73_m2} — ABNORMAL LOW (ref >59)
Glucose: 106 mg/dL — ABNORMAL HIGH (ref 65–99)
Potassium: 4.8 mmol/L (ref 3.5–5.2)
Sodium: 140 mmol/L (ref 134–144)

## 2018-05-22 LAB — CBC
Hematocrit: 36.3 % (ref 34.0–46.6)
Hemoglobin: 11.8 g/dL (ref 11.1–15.9)
MCH: 28.2 pg (ref 26.6–33.0)
MCHC: 32.5 g/dL (ref 31.5–35.7)
MCV: 87 fL (ref 79–97)
Platelets: 335 10*3/uL (ref 150–450)
RBC: 4.19 x10E6/uL (ref 3.77–5.28)
RDW: 14.2 % (ref 11.7–15.4)
WBC: 5.5 10*3/uL (ref 3.4–10.8)

## 2018-05-23 MED FILL — ISOSORBIDE MN ER 60 MG TAB: 60 | 30 days supply | Qty: 45 | Fill #0

## 2018-05-24 ENCOUNTER — Ambulatory Visit (INDEPENDENT_AMBULATORY_CARE_PROVIDER_SITE_OTHER): Payer: No Typology Code available for payment source | Admitting: Internal Medicine

## 2018-05-24 ENCOUNTER — Encounter (INDEPENDENT_AMBULATORY_CARE_PROVIDER_SITE_OTHER): Payer: Self-pay

## 2018-05-24 DIAGNOSIS — E785 Hyperlipidemia, unspecified: Secondary | ICD-10-CM

## 2018-05-24 DIAGNOSIS — E1151 Type 2 diabetes mellitus with diabetic peripheral angiopathy without gangrene: Secondary | ICD-10-CM

## 2018-05-24 DIAGNOSIS — I5042 Chronic combined systolic (congestive) and diastolic (congestive) heart failure: Secondary | ICD-10-CM

## 2018-05-24 DIAGNOSIS — Z794 Long term (current) use of insulin: Secondary | ICD-10-CM

## 2018-05-24 DIAGNOSIS — IMO0002 Reserved for concepts with insufficient information to code with codable children: Secondary | ICD-10-CM

## 2018-05-24 DIAGNOSIS — Z7982 Long term (current) use of aspirin: Secondary | ICD-10-CM

## 2018-05-24 DIAGNOSIS — E1165 Type 2 diabetes mellitus with hyperglycemia: Principal | ICD-10-CM

## 2018-05-24 DIAGNOSIS — I110 Hypertensive heart disease with heart failure: Secondary | ICD-10-CM

## 2018-05-24 DIAGNOSIS — Z79899 Other long term (current) drug therapy: Secondary | ICD-10-CM

## 2018-05-24 DIAGNOSIS — I255 Ischemic cardiomyopathy: Secondary | ICD-10-CM

## 2018-05-24 DIAGNOSIS — I2510 Atherosclerotic heart disease of native coronary artery without angina pectoris: Secondary | ICD-10-CM

## 2018-05-24 NOTE — Patient Instructions (Addendum)
Thank you for allowing [place] to provide your care today. Today we discussed your diabetes.    Today we made no changes to your medications.    Please follow-up with your PCP after your medicaid is approved.    Should you have any questions or concerns please call the internal medicine clinic at [phone].     Diabetes Mellitus and Nutrition, Adult When you have diabetes (diabetes mellitus), it is very important to have healthy eating habits because your blood sugar (glucose) levels are greatly affected by what you eat and drink. Eating healthy foods in the appropriate amounts, at about the same times every day, can help you:  Control your blood glucose.  Lower your risk of heart disease.  Improve your blood pressure.  Reach or maintain a healthy weight. Every person with diabetes is different, and each person has different needs for a meal plan. Your health care provider may recommend that you work with a diet and nutrition specialist (dietitian) to make a meal plan that is best for you. Your meal plan may vary depending on factors such as:  The calories you need.  The medicines you take.  Your weight.  Your blood glucose, blood pressure, and cholesterol levels.  Your activity level.  Other health conditions you have, such as heart or kidney disease. How do carbohydrates affect me? Carbohydrates, also called carbs, affect your blood glucose level more than any other type of food. Eating carbs naturally raises the amount of glucose in your blood. Carb counting is a method for keeping track of how many carbs you eat. Counting carbs is important to keep your blood glucose at a healthy level, especially if you use insulin or take certain oral diabetes medicines. It is important to know how many carbs you can safely have in each meal. This is different for every person. Your dietitian can help you calculate how many carbs you should have at each meal and for each snack. Foods that  contain carbs include:  Bread, cereal, rice, pasta, and crackers.  Potatoes and corn.  Peas, beans, and lentils.  Milk and yogurt.  Fruit and juice.  Desserts, such as cakes, cookies, ice cream, and candy. How does alcohol affect me? Alcohol can cause a sudden decrease in blood glucose (hypoglycemia), especially if you use insulin or take certain oral diabetes medicines. Hypoglycemia can be a life-threatening condition. Symptoms of hypoglycemia (sleepiness, dizziness, and confusion) are similar to symptoms of having too much alcohol. If your health care provider says that alcohol is safe for you, follow these guidelines:  Limit alcohol intake to no more than 1 drink per day for nonpregnant women and 2 drinks per day for men. One drink equals 12 oz of beer, 5 oz of wine, or 1 oz of hard liquor.  Do not drink on an empty stomach.  Keep yourself hydrated with water, diet soda, or unsweetened iced tea.  Keep in mind that regular soda, juice, and other mixers may contain a lot of sugar and must be counted as carbs. What are tips for following this plan?  Reading food labels  Start by checking the serving size on the "Nutrition Facts" label of packaged foods and drinks. The amount of calories, carbs, fats, and other nutrients listed on the label is based on one serving of the item. Many items contain more than one serving per package.  Check the total grams (g) of carbs in one serving. You can calculate the number of servings of carbs  in one serving by dividing the total carbs by 15. For example, if a food has 30 g of total carbs, it would be equal to 2 servings of carbs.  Check the number of grams (g) of saturated and trans fats in one serving. Choose foods that have low or no amount of these fats.  Check the number of milligrams (mg) of salt (sodium) in one serving. Most people should limit total sodium intake to less than 2,300 mg per day.  Always check the nutrition information of  foods labeled as "low-fat" or "nonfat". These foods may be higher in added sugar or refined carbs and should be avoided.  Talk to your dietitian to identify your daily goals for nutrients listed on the label. Shopping  Avoid buying canned, premade, or processed foods. These foods tend to be high in fat, sodium, and added sugar.  Shop around the outside edge of the grocery store. This includes fresh fruits and vegetables, bulk grains, fresh meats, and fresh dairy. Cooking  Use low-heat cooking methods, such as baking, instead of high-heat cooking methods like deep frying.  Cook using healthy oils, such as olive, canola, or sunflower oil.  Avoid cooking with butter, cream, or high-fat meats. Meal planning  Eat meals and snacks regularly, preferably at the same times every day. Avoid going long periods of time without eating.  Eat foods high in fiber, such as fresh fruits, vegetables, beans, and whole grains. Talk to your dietitian about how many servings of carbs you can eat at each meal.  Eat 4-6 ounces (oz) of lean protein each day, such as lean meat, chicken, fish, eggs, or tofu. One oz of lean protein is equal to: ? 1 oz of meat, chicken, or fish. ? 1 egg. ?  cup of tofu.  Eat some foods each day that contain healthy fats, such as avocado, nuts, seeds, and fish. Lifestyle  Check your blood glucose regularly.  Exercise regularly as told by your health care provider. This may include: ? 150 minutes of moderate-intensity or vigorous-intensity exercise each week. This could be brisk walking, biking, or water aerobics. ? Stretching and doing strength exercises, such as yoga or weightlifting, at least 2 times a week.  Take medicines as told by your health care provider.  Do not use any products that contain nicotine or tobacco, such as cigarettes and e-cigarettes. If you need help quitting, ask your health care provider.  Work with a Social worker or diabetes educator to identify  strategies to manage stress and any emotional and social challenges. Questions to ask a health care provider  Do I need to meet with a diabetes educator?  Do I need to meet with a dietitian?  What number can I call if I have questions?  When are the best times to check my blood glucose? Where to find more information:  American Diabetes Association: diabetes.org  Academy of Nutrition and Dietetics: www.eatright.[employer] of Diabetes and Digestive and Kidney Diseases (NIH): [employer] Summary  A healthy meal plan will help you control your blood glucose and maintain a healthy lifestyle.  Working with a diet and nutrition specialist (dietitian) can help you make a meal plan that is best for you.  Keep in mind that carbohydrates (carbs) and alcohol have immediate effects on your blood glucose levels. It is important to count carbs and to use alcohol carefully. This information is not intended to replace advice given to you by your health care provider. Make sure you discuss  any questions you have with your health care provider. Document Released: 01/13/2005 Document Revised: 11/16/2016 Document Reviewed: 05/23/2016 Elsevier Interactive Patient Education  2019 [employer].

## 2018-05-24 NOTE — Progress Notes (Signed)
CC: Type 2 Diabetes Mellitus  HPI: [name] is a 63 y.o. F w/ PMH of ischemic cardiomyopathy, HFrEF, CAD, HTN, HLD, T2DM, and PVd presenting to the clinic after recent hospitalization for respiratory distress 2/2 acute on chronic systolic CHF exacerbation with RSV viral illness. She states that since discharge, she has had no additional respiratory distress and feels back to her baseline status. She has been taking her medications as prescribed and trying to weigh herself regularly. She had a follow up visit with cardiology after discharge and was told that her diabetes is very uncontrolled with a1c of >14 from POC manual entry 2 months ago. She states she was told to follow up with her PCP urgently for better diabetes control. She denies any significant lower extremity edema, chest pain, palpitations, or dizziness. She denies any fevers, chills, nausea, vomiting, diarrhea.  Past Medical History:  Diagnosis Date  . Arthritis    "hands, shoulders, hips, legs" (05/10/2018)  . Arthritis of left hip 08/18/2013   S/p total hip arthroplasty.    . CHF (congestive heart failure) ([place])    2014...@ [facility]  . Chronic lower back pain   . COPD (chronic obstructive pulmonary disease) ([place])   . Healthcare maintenance 06/16/2015  . High cholesterol   . Hypertension   . Influenza A 06/01/2017  . Influenza, pneumonia 06/01/2017  . Left flank pain 10/11/2016  . Myocardial infarction [facility])    "been told that I've had one; don't know when it would have been" (05/10/2018)  . Problem with sexual relationship 10/13/2017  . PVD (peripheral vascular disease) ([place])   . Type II diabetes mellitus ([place])    diagnosed 2001   Review of Systems: Review of Systems  Constitutional: Negative for chills, fever and malaise/fatigue.  Eyes: Negative for blurred vision.  Respiratory: Negative for cough and shortness of breath.   Cardiovascular: Negative for chest pain, palpitations, orthopnea and leg swelling.    Gastrointestinal: Negative for constipation, diarrhea, nausea and vomiting.  Genitourinary: Negative for dysuria.  Neurological: Negative for dizziness, tingling, sensory change, weakness and headaches.     Physical Exam: Vitals:   05/24/18 1317 05/24/18 1348  BP: (!) 152/60 126/68  Pulse: 81   Temp: 98.9 F (37.2 C)   TempSrc: Oral   SpO2: 99%   Weight: 164 lb 6.4 oz (74.6 kg)     Physical Exam  Constitutional: She is oriented to person, place, and time. She appears well-developed and well-nourished. No distress.  HENT:  Mouth/Throat: Oropharynx is clear and moist.  Eyes: Conjunctivae are normal.  Neck: Normal range of motion. Neck supple.  Cardiovascular: Normal rate, regular rhythm, normal heart sounds and intact distal pulses.  Diminished heart sounds  Respiratory: Effort normal and breath sounds normal. She has no wheezes. She has no rales.  GI: Soft. Bowel sounds are normal.  Musculoskeletal: Normal range of motion.  Neurological: She is alert and oriented to person, place, and time.  Skin: Skin is warm and dry.      Assessment & Plan:   DM (diabetes mellitus) type II uncontrolled, periph vascular disorder ([place]) Lab Results  Component Value Date   HGBA1C 7.5 (H) 05/21/2018   [name] presents to clinic for hospital follow-up and management of her diabetes mellitus. She states she has been taking all her medications as prescribed, Levemir 15 units qhs and Janumet.50-1000mg  BID. She was noted to have hgb a1c>14 after not taking medications for 4 months due to lack of coverage but states she  has partial coverage through [facility] and currently is in the process of disability application including Medicaid. She was able to bring her glucometer to this visit which showed meticulous record of daily fasting glucose averaging ~105. Her most recent hgb a1c checked at the cardiologist's office is much improved compared to prior. Considering her history of cardiac disease, she would  benefit from switching to diabetic regimen with more robust cardiovascular benefits switching from DPP4-inhibitor to SGLT-2 inhibitor may bring her a1c down below 7. Canagliflozin-metformin combination would keep the number of pills she takes the same. However, Invokamet is expensive and considering that her hgb a1c is improving, would defer switching her diabetic regimen until after she gets additional coverage through medicaid.   - F/u after medicaid approval to switch from sitagliptin-metformin to canagliflozin-metformin - C/w current regimen: Janumet 50-1000mg  BID, Levemir 15 units qhs  Chronic combined systolic and diastolic CHF, NYHA class 2 ([place]) Had recent hospitalization for acute on chronic systolic heart failure exacerbation. Follows with heart failure clinic. Weight stable (164lb close to dry weight) in clinic today. No sign of hypervolemia on exam. Imdur dose increased per cardiology recently and tolerating it well.  - C/w current regimen: carvedilol 25mg  BID, aspirin 81mg  daily, furosemide 40mg  daily, imdur 90mg  daily    Patient discussed with Dr. Dareen Piano   -Gilberto Better, PGY1

## 2018-05-25 ENCOUNTER — Encounter: Payer: Self-pay | Admitting: Internal Medicine

## 2018-05-25 NOTE — Assessment & Plan Note (Signed)
Had recent hospitalization for acute on chronic systolic heart failure exacerbation. Follows with heart failure clinic. Weight stable (164lb close to dry weight) in clinic today. No sign of hypervolemia on exam. Imdur dose increased per cardiology recently and tolerating it well.  - C/w current regimen: carvedilol 25mg  BID, aspirin 81mg  daily, furosemide 40mg  daily, imdur 90mg  daily

## 2018-05-25 NOTE — Assessment & Plan Note (Addendum)
Lab Results  Component Value Date   HGBA1C 7.5 (H) 05/21/2018   [name] presents to clinic for hospital follow-up and management of her diabetes mellitus. She states she has been taking all her medications as prescribed, Levemir 15 units qhs and Janumet.50-1000mg  BID. She was noted to have hgb a1c>14 after not taking medications for 4 months due to lack of coverage but states she has partial coverage through [facility] and currently is in the process of disability application including Medicaid. She was able to bring her glucometer to this visit which showed meticulous record of daily fasting glucose averaging ~105. Her most recent hgb a1c checked at the cardiologist's office is much improved compared to prior. Considering her history of cardiac disease, she would benefit from switching to diabetic regimen with more robust cardiovascular benefits switching from DPP4-inhibitor to SGLT-2 inhibitor may bring her a1c down below 7. Canagliflozin-metformin combination would keep the number of pills she takes the same. However, Invokamet is expensive and considering that her hgb a1c is improving, would defer switching her diabetic regimen until after she gets additional coverage through medicaid.   - F/u after medicaid approval to switch from sitagliptin-metformin to canagliflozin-metformin - C/w current regimen: Janumet 50-1000mg  BID, Levemir 15 units qhs

## 2018-05-28 NOTE — Progress Notes (Signed)
Internal Medicine Clinic Attending ° °Case discussed with Dr. Lee at the time of the visit.  We reviewed the resident’s history and exam and pertinent patient test results.  I agree with the assessment, diagnosis, and plan of care documented in the resident’s note.  °

## 2018-05-29 MED FILL — LEVEMIR FLEXTOUCH 100 UNITS: 100 | 20 days supply | Qty: 3 | Fill #1

## 2018-05-29 MED FILL — CARVEDILOL 25 MG TABLET: 25 | 30 days supply | Qty: 60 | Fill #1

## 2018-05-31 ENCOUNTER — Encounter: Payer: Self-pay | Admitting: Gastroenterology

## 2018-06-04 ENCOUNTER — Other Ambulatory Visit: Payer: Self-pay | Admitting: Internal Medicine

## 2018-06-04 DIAGNOSIS — E1165 Type 2 diabetes mellitus with hyperglycemia: Principal | ICD-10-CM

## 2018-06-04 DIAGNOSIS — IMO0002 Reserved for concepts with insufficient information to code with codable children: Secondary | ICD-10-CM

## 2018-06-04 DIAGNOSIS — E1151 Type 2 diabetes mellitus with diabetic peripheral angiopathy without gangrene: Secondary | ICD-10-CM

## 2018-06-04 MED FILL — ATORVASTATIN 40 MG TABLET: 40 | 90 days supply | Qty: 90 | Fill #0

## 2018-06-08 MED FILL — JANUMET 50-1,000 MG TABLET: 50-1000 | 30 days supply | Qty: 60 | Fill #3

## 2018-06-08 MED FILL — LOSARTAN POTASSIUM 100 MG T: 100 | 30 days supply | Qty: 30 | Fill #3

## 2018-06-14 ENCOUNTER — Ambulatory Visit: Payer: Self-pay | Admitting: Gastroenterology

## 2018-06-14 NOTE — Addendum Note (Signed)
Addended by: Hulan Fray on: 06/14/2018 05:53 PM   Modules accepted: Orders

## 2018-06-18 MED FILL — LEVEMIR FLEXTOUCH 100 UNITS: 100 | 20 days supply | Qty: 3 | Fill #2

## 2018-06-19 MED FILL — ISOSORBIDE MN ER 60 MG TAB: 60 | 30 days supply | Qty: 45 | Fill #1

## 2018-06-19 NOTE — Progress Notes (Deleted)
   CC: ***  HPI:  [name] is a 63 y.o. with PMH as below.   Please see A&P for assessment of the patient's acute and chronic medical conditions.   CHF Baseline weight around 165. Is she still weighing herself daily?  Saw cardiology 1/09 and had even more increased SOB   TIIDM -          Hgba1c check?   Past Medical History:  Diagnosis Date  . Arthritis    "hands, shoulders, hips, legs" (05/10/2018)  . Arthritis of left hip 08/18/2013   S/p total hip arthroplasty.    . CHF (congestive heart failure) ([place])    2014...@ [facility]  . Chronic lower back pain   . COPD (chronic obstructive pulmonary disease) ([place])   . Healthcare maintenance 06/16/2015  . High cholesterol   . Hypertension   . Influenza A 06/01/2017  . Influenza, pneumonia 06/01/2017  . Left flank pain 10/11/2016  . Myocardial infarction [facility])    "been told that I've had one; don't know when it would have been" (05/10/2018)  . Problem with sexual relationship 10/13/2017  . PVD (peripheral vascular disease) ([place])   . Type II diabetes mellitus ([place])    diagnosed 2001   Review of Systems:  ***  Physical Exam:  Constitution: HENT: Eyes: Cardio: Respiratory: Abdominal: MSK: GU: Neuro: Skin:   There were no vitals filed for this visit. ***  Assessment & Plan:   See Encounters Tab for problem based charting.  Patient {GC/GE:[id number]::"discussed with","seen with"} Dr. {NAMES:[id number]::"Butcher","Granfortuna","E. Hoffman","Klima","Mullen","Narendra","Raines","Vincent"}

## 2018-06-20 ENCOUNTER — Encounter: Payer: Self-pay | Admitting: Internal Medicine

## 2018-06-28 ENCOUNTER — Encounter: Payer: Self-pay | Admitting: Interventional Cardiology

## 2018-06-28 ENCOUNTER — Ambulatory Visit (INDEPENDENT_AMBULATORY_CARE_PROVIDER_SITE_OTHER): Payer: No Typology Code available for payment source | Admitting: Interventional Cardiology

## 2018-06-28 VITALS — BP 128/68 | HR 80 | Ht 63.0 in | Wt 162.4 lb

## 2018-06-28 DIAGNOSIS — IMO0002 Reserved for concepts with insufficient information to code with codable children: Secondary | ICD-10-CM

## 2018-06-28 DIAGNOSIS — I5042 Chronic combined systolic (congestive) and diastolic (congestive) heart failure: Secondary | ICD-10-CM

## 2018-06-28 DIAGNOSIS — I2510 Atherosclerotic heart disease of native coronary artery without angina pectoris: Secondary | ICD-10-CM

## 2018-06-28 DIAGNOSIS — E1165 Type 2 diabetes mellitus with hyperglycemia: Secondary | ICD-10-CM

## 2018-06-28 DIAGNOSIS — E1151 Type 2 diabetes mellitus with diabetic peripheral angiopathy without gangrene: Secondary | ICD-10-CM

## 2018-06-28 DIAGNOSIS — I10 Essential (primary) hypertension: Secondary | ICD-10-CM

## 2018-06-28 DIAGNOSIS — I447 Left bundle-branch block, unspecified: Secondary | ICD-10-CM

## 2018-06-28 DIAGNOSIS — E785 Hyperlipidemia, unspecified: Secondary | ICD-10-CM

## 2018-06-28 MED ORDER — ISOSORBIDE MONONITRATE ER 120 MG PO TB24: 120.0000 mg | ORAL_TABLET | Freq: Every day | ORAL | 3 refills | Status: DC

## 2018-06-28 MED FILL — ISOSORBIDE MN ER 120 MG TAB: 120 | 30 days supply | Qty: 30 | Fill #0

## 2018-06-28 NOTE — Patient Instructions (Signed)
Medication Instructions:  1) INCREASE Imdur 120mg  once daily 2) Your physician recommends that you use your Nitroglycerin when you are having chest tightness.  He would like for you to document how often you are using it and report back to [place] at your follow up.   If you need a refill on your cardiac medications before your next appointment, please call your pharmacy.   Lab work: None If you have labs (blood work) drawn today and your tests are completely normal, you will receive your results only by: Marland Kitchen MyChart Message (if you have MyChart) OR . A paper copy in the mail If you have any lab test that is abnormal or we need to change your treatment, we will call you to review the results.  Testing/Procedures: Your physician has requested that you have an echocardiogram 3 weeks prior to seeing Dr. Tamala Julian back. Echocardiography is a painless test that uses sound waves to create images of your heart. It provides your doctor with information about the size and shape of your heart and how well your heart's chambers and valves are working. This procedure takes approximately one hour. There are no restrictions for this procedure.    Follow-Up: At [facility], you and your health needs are our priority.  As part of our continuing mission to provide you with exceptional heart care, we have created designated Provider Care Teams.  These Care Teams include your primary Cardiologist (physician) and Advanced Practice Providers (APPs -  Physician Assistants and Nurse Practitioners) who all work together to provide you with the care you need, when you need it. You will need a follow up appointment in 3 months.  Please call our office 2 months in advance to schedule this appointment.  You may see Sinclair Grooms, MD or one of the following Advanced Practice Providers on your designated Care Team:   Truitt Merle, NP Cecilie Kicks, NP . Kathyrn Drown, NP  Any Other Special Instructions Will Be Listed Below  (If Applicable).

## 2018-06-28 NOTE — Progress Notes (Signed)
Cardiology Office Note:    Date:  06/28/2018   ID:  [name], DOB Jun 08, 1955, MRN [id number]  PCP:  Alyssa Heck, DO  Cardiologist:  Alyssa Grooms, MD   Referring MD: Alyssa Heck, DO   Chief Complaint  Patient presents with  . Coronary Artery Disease  . Congestive Heart Failure    History of Present Illness:    [name] is a 63 y.o. female with a hx of HTN, uncontrolled DM, PVD, chronic diastolic heart failure, prior history of low EF in 2015 of 30-35%, improved EF in 2018, and history of abnormal NST but no prior hx of cardiac cath. She was admitted 06/01/17-06/05/17 for influenza A. Echo that admission showed reduced LVEF of 25-30%. She also had chest pain prior to discharge and was restarted on a lower dose of losartan 50 mg (was on 100 mg). Her discharge weight was 167 lbs. Nuclear stress test on 10/2016 without ischemia.  [name] is doing better.  She has rare episodes of chest tightness that go away with rest.  She has not tried nitroglycerin.  Orthopnea has significantly improved.  Ambulation is increasing.  All in all she feels better with the recent up titration of medical therapy.  She has had no prolonged episodes of chest pain.  Past Medical History:  Diagnosis Date  . Arthritis    "hands, shoulders, hips, legs" (05/10/2018)  . Arthritis of left hip 08/18/2013   S/p total hip arthroplasty.    . CHF (congestive heart failure) ([place])    2014...@ [facility]  . Chronic lower back pain   . COPD (chronic obstructive pulmonary disease) ([place])   . Healthcare maintenance 06/16/2015  . High cholesterol   . Hypertension   . Influenza A 06/01/2017  . Influenza, pneumonia 06/01/2017  . Left flank pain 10/11/2016  . Myocardial infarction [facility])    "been told that I've had one; don't know when it would have been" (05/10/2018)  . Problem with sexual relationship 10/13/2017  . PVD (peripheral vascular disease) ([place])   . Type II diabetes mellitus ([place])    diagnosed 2001     Past Surgical History:  Procedure Laterality Date  . ABDOMINAL ANGIOGRAM  12/28/2012  . ABDOMINAL ANGIOGRAM N/A 12/29/2011   Procedure: ABDOMINAL ANGIOGRAM;  Surgeon: Conrad Vega Alta, MD;  Location: [facility] CATH LAB;  Service: Cardiovascular;  Laterality: N/A;  . ABDOMINAL AORTAGRAM N/A 12/21/2012   Procedure: ABDOMINAL Maxcine Ham;  Surgeon: Elam Dutch, MD;  Location[facility] CATH LAB;  Service: Cardiovascular;  Laterality: N/A;  . EYE SURGERY Bilateral ~ 2018   "laser; related to my diabetes"  . JOINT REPLACEMENT    . PERIPHERAL VASCULAR CATHETERIZATION     "had to have some veins unclogged cause I was in pain when I walked" (05/10/2018)  . RIGHT/LEFT HEART CATH AND CORONARY ANGIOGRAPHY N/A 04/30/2018   Procedure: RIGHT/LEFT HEART CATH AND CORONARY ANGIOGRAPHY;  Surgeon: [name], MD;  Location: [place] CV LAB;  Service: Cardiovascular;  Laterality: N/A;  . TOTAL HIP ARTHROPLASTY Left 08/19/2013   Procedure: TOTAL HIP ARTHROPLASTY;  Surgeon: Kerin Salen, MD;  Location: [place];  Service: Orthopedics;  Laterality: Left;  . TUBAL LIGATION      Current Medications: Current Meds  Medication Sig  . albuterol (PROVENTIL HFA) 108 (90 Base) MCG/ACT inhaler Inhale 2 puffs into the lungs every 6 (six) hours as needed for wheezing or shortness of breath.  Marland Kitchen aspirin EC 81 MG tablet Take  1 tablet (81 mg total) by mouth daily. IM program, [employer] [facility]  . atorvastatin (LIPITOR) 40 MG tablet TAKE 1 TABLET (40 MG TOTAL) BY MOUTH DAILY.  . blood glucose meter kit and supplies KIT Dispense based on patient and insurance preference. Use up to four times daily as directed. (FOR ICD-9 250.00, 250.01).  . carvedilol (COREG) 25 MG tablet Take 1 tablet (25 mg total) by mouth 2 (two) times daily.  . Cholecalciferol (VITAMIN D3) 125 MCG (5000 UT) CAPS Take 5,000 Units by mouth daily.  . diphenhydrAMINE (BENADRYL) 25 MG tablet Take 25 mg by mouth daily as needed for allergies.  . diphenhydrAMINE HCl, Sleep,  (ZZZQUIL) 25 MG CAPS Take 50 mg by mouth at bedtime as needed (sleep).  . famotidine (PEPCID) 20 MG tablet Take 20 mg by mouth daily as needed for heartburn or indigestion.  . furosemide (LASIX) 40 MG tablet Take 1 tablet (40 mg total) by mouth daily.  . Insulin Detemir (LEVEMIR FLEXTOUCH) 100 UNIT/ML Pen Inject 15 Units into the skin at bedtime.  . isosorbide mononitrate (IMDUR) 60 MG 24 hr tablet Take 1.5 tablets (90 mg total) by mouth daily.  Marland Kitchen losartan (COZAAR) 100 MG tablet Take 1 tablet (100 mg total) by mouth daily.  . Multiple Vitamins-Minerals (MULTIVITAMIN GUMMIES ADULT) CHEW Chew 2 tablets by mouth daily.  . nitroGLYCERIN (NITROSTAT) 0.4 MG SL tablet Place 1 tablet (0.4 mg total) under the tongue every 5 (five) minutes as needed for chest pain.  . potassium chloride SA (K-DUR,KLOR-CON) 20 MEQ tablet Take 1 tablet (20 mEq total) by mouth daily.  . sitaGLIPtin-metformin (JANUMET) 50-1000 MG tablet Take 1 tablet by mouth 2 (two) times daily with a meal.     Allergies:   Lisinopril and Plavix [clopidogrel bisulfate]   Social History   Socioeconomic History  . Marital status: Married    Spouse name: Not on file  . Number of children: Not on file  . Years of education: 12,college  . Highest education level: Not on file  Occupational History  . Not on file  Social Needs  . Financial resource strain: Not on file  . Food insecurity:    Worry: Not on file    Inability: Not on file  . Transportation needs:    Medical: Not on file    Non-medical: Not on file  Tobacco Use  . Smoking status: Former Smoker    Years: 7.00    Types: Cigarettes    Last attempt to quit: 05/08/1987    Years since quitting: 31.1  . Smokeless tobacco: Never Used  . Tobacco comment: 05/10/2018 "smoked when I drank"  Substance and Sexual Activity  . Alcohol use: Yes    Alcohol/week: 0.0 standard drinks    Comment: 05/10/2018 "glass of wine on holidays"  . Drug use: Not Currently  . Sexual activity: Not  Currently  Lifestyle  . Physical activity:    Days per week: Not on file    Minutes per session: Not on file  . Stress: Not on file  Relationships  . Social connections:    Talks on phone: Not on file    Gets together: Not on file    Attends religious service: Not on file    Active member of club or organization: Not on file    Attends meetings of clubs or organizations: Not on file    Relationship status: Not on file  Other Topics Concern  . Not on file  Social History Narrative  .  Not on file     Family History: The patient's family history includes Diabetes in her mother.  ROS:   Please see the history of present illness.    Bilateral hip pain and stiffness.  Muscle and back pain.  Abdominal pain, shortness of breath, skipped heartbeats, excessive fatigue, occasional orthopnea.  All other systems reviewed and are negative.  EKGs/Labs/Other Studies Reviewed:    The following studies were reviewed today: Coronary angiography 04/30/2018: Diagnostic  Dominance: Right     EKG:  EKG not repeated  Recent Labs: 08/31/2017: ALT 14 05/10/2018: B Natriuretic Peptide 566.4; NT-Pro BNP 2,478 05/21/2018: BUN 41; Creatinine, Ser 1.65; Hemoglobin 11.8; Platelets 335; Potassium 4.8; Sodium 140  Recent Lipid Panel    Component Value Date/Time   CHOL 149 08/31/2017 0941   TRIG 74 08/31/2017 0941   HDL 60 08/31/2017 0941   CHOLHDL 2.5 08/31/2017 0941   CHOLHDL 3.7 11/08/2012 1331   VLDL 19 11/08/2012 1331   LDLCALC 74 08/31/2017 0941    Physical Exam:    VS:  BP 128/68   Pulse 80   Ht _0  (1.6 m)   Wt 162 lb 6.4 oz (73.7 kg)   SpO2 99%   BMI 28.77 kg/m     Wt Readings from Last 3 Encounters:  06/28/18 162 lb 6.4 oz (73.7 kg)  05/24/18 164 lb 6.4 oz (74.6 kg)  05/21/18 164 lb 6.4 oz (74.6 kg)     GEN: Definitely appears older than stated age.. No acute distress HEENT: Normal NECK: No JVD. LYMPHATICS: No lymphadenopathy CARDIAC:RRR.  No murmur, S4 gallop, no  edema VASCULAR: 2+ bilateral radial pulses, no bruits RESPIRATORY:  Clear to auscultation without rales, wheezing or rhonchi  ABDOMEN: Soft, non-tender, non-distended, No pulsatile mass, MUSCULOSKELETAL: No deformity  SKIN: Warm and dry NEUROLOGIC:  Alert and oriented x 3 PSYCHIATRIC:  Normal affect   ASSESSMENT:    1. Chronic combined systolic and diastolic CHF, NYHA class 2 ([place])   2. LBBB (left bundle branch block)   3. DM (diabetes mellitus) type II uncontrolled, periph vascular disorder ([place])   4. Hyperlipidemia, unspecified hyperlipidemia type   5. Essential hypertension   6. Coronary artery disease involving native heart, angina presence unspecified, unspecified vessel or lesion type    PLAN:    In order of problems listed above:  1. Neck veins are flat.  She is not volume overloaded.  Suspect LV function is again improving.  Repeat an echocardiogram prior to next office visit in 3 months. 2. Not reassessed.  If LV function remains low, consider resynchronization therapy 3. Hemoglobin A1c target less than 7 4. LDL target less than 70. 5. Excellent blood pressure currently at 128/68 mmHg. 6. Uptitrate isosorbide to 120 mg/day.  Encouraged her to use sublingual nitroglycerin more liberally.  On next visit she should report [place] how often nitroglycerin is required.  Overall education and awareness concerning primary/secondary risk prevention was discussed in detail: LDL less than 70, hemoglobin A1c less than 7, blood pressure target less than 130/80 mmHg, >150 minutes of moderate aerobic activity per week, avoidance of smoking, weight control (via diet and exercise), and continued surveillance/management of/for obstructive sleep apnea.  2D Doppler echocardiogram before next office visit.  80-monthfollow-up.   Medication Adjustments/Labs and Tests Ordered: Current medicines are reviewed at length with the patient today.  Concerns regarding medicines are outlined above.  No orders  of the defined types were placed in this encounter.  No orders of the defined  types were placed in this encounter.   There are no Patient Instructions on file for this visit.   Signed, Alyssa Grooms, MD  06/28/2018 10:41 AM    [place]

## 2018-06-29 MED FILL — CARVEDILOL 25 MG TABLET: 25 | 30 days supply | Qty: 60 | Fill #2

## 2018-07-05 ENCOUNTER — Telehealth: Payer: Self-pay | Admitting: Internal Medicine

## 2018-07-05 NOTE — Telephone Encounter (Signed)
Pt was approved for the access med; she wants a callback [phone]

## 2018-07-05 NOTE — Telephone Encounter (Signed)
rtc to pt, vmail has not been setup, unable to leave message

## 2018-07-09 NOTE — Telephone Encounter (Signed)
No answer, no vmail ability

## 2018-07-11 ENCOUNTER — Other Ambulatory Visit: Payer: Self-pay | Admitting: *Deleted

## 2018-07-11 ENCOUNTER — Telehealth: Payer: Self-pay | Admitting: Interventional Cardiology

## 2018-07-11 DIAGNOSIS — I5042 Chronic combined systolic (congestive) and diastolic (congestive) heart failure: Secondary | ICD-10-CM

## 2018-07-11 DIAGNOSIS — E1151 Type 2 diabetes mellitus with diabetic peripheral angiopathy without gangrene: Secondary | ICD-10-CM

## 2018-07-11 DIAGNOSIS — I10 Essential (primary) hypertension: Secondary | ICD-10-CM

## 2018-07-11 DIAGNOSIS — E1165 Type 2 diabetes mellitus with hyperglycemia: Secondary | ICD-10-CM

## 2018-07-11 DIAGNOSIS — IMO0002 Reserved for concepts with insufficient information to code with codable children: Secondary | ICD-10-CM

## 2018-07-11 MED ORDER — FUROSEMIDE 40 MG PO TABS: 40.0000 mg | ORAL_TABLET | Freq: Every day | ORAL | 3 refills | Status: DC

## 2018-07-11 MED ORDER — CARVEDILOL 25 MG PO TABS: 25.0000 mg | ORAL_TABLET | Freq: Two times a day (BID) | ORAL | 3 refills | Status: DC

## 2018-07-11 MED ORDER — NITROGLYCERIN 0.4 MG SL SUBL: 0.4000 mg | SUBLINGUAL_TABLET | SUBLINGUAL | 1 refills | Status: DC | PRN

## 2018-07-11 MED ORDER — POTASSIUM CHLORIDE CRYS ER 20 MEQ PO TBCR: 20.0000 meq | EXTENDED_RELEASE_TABLET | Freq: Every day | ORAL | 3 refills | Status: DC

## 2018-07-11 MED ORDER — POTASSIUM CHLORIDE CRYS ER 20 MEQ PO TBCR: 20.0000 meq | EXTENDED_RELEASE_TABLET | Freq: Every day | ORAL | 0 refills | Status: DC

## 2018-07-11 MED ORDER — ISOSORBIDE MONONITRATE ER 120 MG PO TB24: 120.0000 mg | ORAL_TABLET | Freq: Every day | ORAL | 3 refills | Status: DC

## 2018-07-11 NOTE — Telephone Encounter (Signed)
Called pt to inform her per Maude Leriche, LPN, Dr. Thompson Caul nurse that pt needed to refer to PCP for a refill on albuterol. Pt verbalized understanding.

## 2018-07-11 NOTE — Telephone Encounter (Signed)
Pt calling requesting a refill on albuterol. Pt's other medications were sent to pt's pharmacy as requested. Confirmation received. Would Dr. Tamala Julian like to refill this medication? Please address

## 2018-07-11 NOTE — Telephone Encounter (Signed)
Needs to come from PCP

## 2018-07-11 NOTE — Telephone Encounter (Signed)
Pt is in the process of switching her medications to mail order. In the mean time, she is almost out of her potassium chloride SA (K-DUR,KLOR-CON) 20 MEQ tablet. She wanted to know if the office had any samples on hand for her to pick up while she waits for her mail order.  If no samples are available, please send a refill to  [facility], [place]. This is the pharmacy she can go to in person.  Please call and advise pt where medication will be, so she can arrange transportation.

## 2018-07-11 NOTE — Telephone Encounter (Signed)
°*  STAT* If patient is at the pharmacy, call can be transferred to refill team.   1. Which medications need to be refilled? (please list name of each medication and dose if known) potassium chloride SA (K-DUR,KLOR-CON) 20 MEQ tablet furosemide (LASIX) 40 MG tablet carvedilol (COREG) 25 MG tablet isosorbide mononitrate (IMDUR) 120 MG 24 hr tablet nitroGLYCERIN (NITROSTAT) 0.4 MG SL tablet albuterol (PROVENTIL HFA) 108 (90 Base) MCG/ACT inhaler   2. Which pharmacy/location (including street and city if local pharmacy) is medication to be sent to? [facility] [place], - [place], [place], [place] [address]   3. Do they need a 30 day or 90 day supply? 90  Pt is changing her medication to mail order. She is having a hard time getting out to the pharmacy to pick up her refills. Waiting on approval from Medicaid to get discounted rx. Also waiting to get approved for disability.

## 2018-07-11 NOTE — Telephone Encounter (Signed)
Called pt to informed her that we do not have samples on potassium chloride and that I sent a 30 day supply to the local pharmacy as pt requested. I also informed pt that all other medications were sent to the mail order pharmacy as requested and if she has any other problems, questions or concerns to call the office. Pt verbalized understanding.

## 2018-07-12 MED ORDER — SITAGLIPTIN PHOS-METFORMIN HCL 50-1000 MG PO TABS: 1.0000 | ORAL_TABLET | Freq: Two times a day (BID) | ORAL | 6 refills | Status: DC

## 2018-07-12 MED ORDER — LOSARTAN POTASSIUM 100 MG PO TABS: 100.0000 mg | ORAL_TABLET | Freq: Every day | ORAL | 3 refills | Status: DC

## 2018-07-12 MED ORDER — ATORVASTATIN CALCIUM 40 MG PO TABS: 40.0000 mg | ORAL_TABLET | Freq: Every day | ORAL | 0 refills | Status: DC

## 2018-07-12 MED ORDER — INSULIN DETEMIR 100 UNIT/ML FLEXPEN: 15.0000 [IU] | PEN_INJECTOR | Freq: Every day | SUBCUTANEOUS | 1 refills | Status: DC

## 2018-07-13 ENCOUNTER — Encounter: Payer: Self-pay | Admitting: Internal Medicine

## 2018-07-13 ENCOUNTER — Other Ambulatory Visit: Payer: Self-pay | Admitting: Internal Medicine

## 2018-07-13 DIAGNOSIS — I10 Essential (primary) hypertension: Secondary | ICD-10-CM

## 2018-07-13 DIAGNOSIS — I5042 Chronic combined systolic (congestive) and diastolic (congestive) heart failure: Secondary | ICD-10-CM

## 2018-07-13 NOTE — Telephone Encounter (Signed)
losartan (COZAAR) 100 MG table  Refill request @  [place], [place] - [address]. [phone] (Phone) [phone] (Fax)   Pt would like this med by today.

## 2018-07-16 ENCOUNTER — Other Ambulatory Visit: Payer: Self-pay | Admitting: Internal Medicine

## 2018-07-16 ENCOUNTER — Telehealth: Payer: Self-pay | Admitting: Internal Medicine

## 2018-07-16 DIAGNOSIS — E1165 Type 2 diabetes mellitus with hyperglycemia: Principal | ICD-10-CM

## 2018-07-16 DIAGNOSIS — IMO0002 Reserved for concepts with insufficient information to code with codable children: Secondary | ICD-10-CM

## 2018-07-16 DIAGNOSIS — I5042 Chronic combined systolic (congestive) and diastolic (congestive) heart failure: Secondary | ICD-10-CM

## 2018-07-16 DIAGNOSIS — I10 Essential (primary) hypertension: Secondary | ICD-10-CM

## 2018-07-16 DIAGNOSIS — E1151 Type 2 diabetes mellitus with diabetic peripheral angiopathy without gangrene: Secondary | ICD-10-CM

## 2018-07-16 MED ORDER — LOSARTAN POTASSIUM 100 MG PO TABS: 100.0000 mg | ORAL_TABLET | Freq: Every day | ORAL | 3 refills | Status: DC

## 2018-07-16 MED ORDER — BASAGLAR KWIKPEN 100 UNIT/ML ~~LOC~~ SOPN: 15.0000 [IU] | PEN_INJECTOR | Freq: Every day | SUBCUTANEOUS | 3 refills | Status: DC

## 2018-07-16 NOTE — Telephone Encounter (Signed)
Pt has orange card thru April 2020 Also signed up with [facility], script was sent to [facility] IM PROGRAM cannot be used by pt Is this accurate?

## 2018-07-16 NOTE — Telephone Encounter (Signed)
Correct. Unless getting it at [facility] will prevent an admission.

## 2018-07-16 NOTE — Telephone Encounter (Signed)
Losartan has been switched to the correct pharmacy. Thanks!

## 2018-07-16 NOTE — Telephone Encounter (Signed)
Patient switched to Basaglar due to [place]

## 2018-07-16 NOTE — Telephone Encounter (Signed)
Needs refill on losartan (COZAAR) 100 MG tablet [place], [place] - [address] [facility]. ;pt contact [phone]

## 2018-07-25 ENCOUNTER — Encounter: Payer: Self-pay | Admitting: Internal Medicine

## 2018-07-27 ENCOUNTER — Telehealth: Payer: Self-pay | Admitting: Interventional Cardiology

## 2018-07-27 NOTE — Telephone Encounter (Signed)
Pt called because she applied for Medicaid, and Dr. Tamala Julian was going to send over information about her health status. They have not received that paperwork yet.

## 2018-07-27 NOTE — Telephone Encounter (Signed)
°*  STAT* If patient is at the pharmacy, call can be transferred to refill team.   1. Which medications need to be refilled? (please list name of each medication and dose if known)  nitroGLYCERIN (NITROSTAT) 0.4 MG SL tablet  2. Which pharmacy/location (including street and city if local pharmacy) is medication to be sent to? [facility] [place], [place]  3. Do they need a 30 day or 90 day supply? [place]

## 2018-07-31 ENCOUNTER — Telehealth: Payer: Self-pay | Admitting: Dietician

## 2018-07-31 NOTE — Telephone Encounter (Signed)
[name] is a 63 y.o. female who was contacted on behalf of [facility]iatric[facility] Task Force.   Diabetes Assessment  DM meds and BS checks -  "What medications are you taking for diabetes?" janumet, basaglar 15 units -  "How often do you check your blood sugars at home?" 1x/day - "What have your blood sugars been?" 100, 120  High Blood Pressure Assessment  BP meds & BP checks -  "What medications are you taking for high blood pressure?" losartan, coreg, lasix - "How often do you check your blood pressure at home?" no - "What have your blood pressure readings been?" 128/68  Coping with DM and BP - What else are you doing to help with your DM and BP -  - Diet? Low sodium, more vegetables and salads -  Exercise? Being in the house if fine, twice a week plays with grandchildren, walks hallway in her building  Medication Access Issues  Medication Issues? -  "Are you getting your medicines refilled on time without skipping any doses?" yes - "Are you having any problems getting/taking your meds (cost, timing, transportation)?" no - Do you need any meds refilled? no  Conclusion  Close the call - Date of follow-up visit scheduled-08/29/18- told her it may be a telephone visit  - Any other questions or concerns?  "no, we have plenty of water, food, toilet paper"   Debera Lat, RD 07/31/2018 2:20 PM.

## 2018-08-13 ENCOUNTER — Telehealth: Payer: Self-pay | Admitting: *Deleted

## 2018-08-13 DIAGNOSIS — E1165 Type 2 diabetes mellitus with hyperglycemia: Principal | ICD-10-CM

## 2018-08-13 DIAGNOSIS — IMO0002 Reserved for concepts with insufficient information to code with codable children: Secondary | ICD-10-CM

## 2018-08-13 DIAGNOSIS — E1151 Type 2 diabetes mellitus with diabetic peripheral angiopathy without gangrene: Secondary | ICD-10-CM

## 2018-08-13 NOTE — Telephone Encounter (Signed)
Would like to speak with Bonnita Nasuti. Please call back.

## 2018-08-13 NOTE — Telephone Encounter (Signed)
Can you resend prescription request, for some reason the request disappeared. Thanks!

## 2018-08-15 MED FILL — UNIFINE PENTIPS 31GX3/16: 31G X 5 MM | 100 days supply | Qty: 100 | Fill #0

## 2018-08-15 MED FILL — UNIFINE PENTIPS 31GX3/16": 31G X 5 MM | 100 days supply | Qty: 100 | Fill #0

## 2018-08-29 ENCOUNTER — Ambulatory Visit (INDEPENDENT_AMBULATORY_CARE_PROVIDER_SITE_OTHER): Payer: No Typology Code available for payment source | Admitting: Internal Medicine

## 2018-08-29 ENCOUNTER — Other Ambulatory Visit: Payer: Self-pay

## 2018-08-29 DIAGNOSIS — E1151 Type 2 diabetes mellitus with diabetic peripheral angiopathy without gangrene: Secondary | ICD-10-CM

## 2018-08-29 DIAGNOSIS — IMO0002 Reserved for concepts with insufficient information to code with codable children: Secondary | ICD-10-CM

## 2018-08-29 DIAGNOSIS — I428 Other cardiomyopathies: Secondary | ICD-10-CM

## 2018-08-29 DIAGNOSIS — E1165 Type 2 diabetes mellitus with hyperglycemia: Secondary | ICD-10-CM

## 2018-08-29 MED ORDER — NITROGLYCERIN 0.4 MG SL SUBL: 0.4000 mg | SUBLINGUAL_TABLET | SUBLINGUAL | 0 refills | Status: DC | PRN

## 2018-08-29 MED ORDER — CANAGLIFLOZIN-METFORMIN HCL 50-500 MG PO TABS: 50.0000 mg | ORAL_TABLET | Freq: Two times a day (BID) | ORAL | 2 refills | Status: DC
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
## 2018-08-30 MED FILL — NITROGLYCERIN 0.4 MG TAB SL: 0.4 | 13 days supply | Qty: 25 | Fill #0
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
## 2018-08-30 NOTE — Progress Notes (Signed)
Internal Medicine Clinic Attending  Case discussed with Dr. Seawell at the time of the visit.  We reviewed the resident's history and exam and pertinent patient test results.  I agree with the assessment, diagnosis, and plan of care documented in the resident's note.    

## 2018-08-30 NOTE — Assessment & Plan Note (Signed)
She states the janumet continues to give her diarrhea. She checks her glucose when she feels it may be low or high and it has been has been around 89 - 120. Previously was planned to switch to SGL-2 and stop DDP-4 but was waiting for medicaid coverage, which she now has. She is taking lantus 15U in the evening. She did have one episode of hypoglycemia several months ago but has not had this recently and has had no symptoms.   - switch to canagliflozin-metformin 50-500 mg bid, d/c Janumet - cont. lantus 15U qhs - A1c at f/u when clinic reopens

## 2018-09-04 ENCOUNTER — Other Ambulatory Visit: Payer: Self-pay | Admitting: Dietician

## 2018-09-04 ENCOUNTER — Telehealth: Payer: Self-pay | Admitting: *Deleted

## 2018-09-04 ENCOUNTER — Telehealth: Payer: Self-pay

## 2018-09-04 DIAGNOSIS — E1151 Type 2 diabetes mellitus with diabetic peripheral angiopathy without gangrene: Secondary | ICD-10-CM

## 2018-09-04 DIAGNOSIS — E1165 Type 2 diabetes mellitus with hyperglycemia: Principal | ICD-10-CM

## 2018-09-04 DIAGNOSIS — IMO0002 Reserved for concepts with insufficient information to code with codable children: Secondary | ICD-10-CM

## 2018-09-04 NOTE — Telephone Encounter (Signed)
Thanks for update. I agreed with the plan. She does not currently need to decrease her lantus and can continue the metformin at the lower dose until her canagliflozin metformin is approved. I think her medications are mailed to her. Do I need to switch the canagliflozin/metformin to [employer]? Thank you everyone for your help.

## 2018-09-04 NOTE — Telephone Encounter (Signed)
Call transferred from triage nurse: Ms Maulden reports that she has been cutting her metformin 1000mg  in half and taking 500 mg twice daily. Has not had diarrhea with this dose since last Thursday. Wants our permission to do this until she gets the new medicine that she thinks needs a PA done before she can get it (canagliflozin metformin 50-500 mg tabs) .   Insulin is basaglar she takes 15 units per day. Just got [place] 2 weeks ago.  Sugar has been looking good- 114/127/97/ highest 151 and not very often, had one low of 69 last couple of weeks- light headed, eyes blurry. Happened in the morning.    Prefers [facility] pharmacy if prescriptions need to be sent somewhere else because of her new insurance status. Wants to know if she should lower her insulin?  I told her to continue taking 500 mg metformin twice daily until she gets the new medicine,  that we'd call her if Dr. Sharon Seller wants her to lower her insulin dose, and that  I'd ask pharmacy to see if any of her prescriptions need to changed from [place] to [facility] now that she has insurance. Debera Lat, RD 09/04/2018 10:36 AM.

## 2018-09-04 NOTE — Telephone Encounter (Signed)
Thank you Dr. Sharon Seller. It looks like the canagliflozin-metformin combo pill was sent to [facility]. I forgot she'll need a new Accu chek guide meter and supplies. I'll send that request in a refill note.

## 2018-09-04 NOTE — Telephone Encounter (Signed)
Returned pt's call - no one answer; left message to give [place] a call back.

## 2018-09-04 NOTE — Telephone Encounter (Signed)
Requesting to speak with a nurse about meds. Please call pt back.  

## 2018-09-04 NOTE — Telephone Encounter (Signed)
New to [facility] Medicaid so she needs an Accu chek meter and supplies.

## 2018-09-04 NOTE — Telephone Encounter (Signed)
Pt stated the pharmacy told her her ins co will not pay for canagliflozin/metformin; wanting to know what to do.  I will ask Leonia Corona to f/u on prior authorization . And I asked Butch Penny Plyler to talk to pt.

## 2018-09-05 ENCOUNTER — Other Ambulatory Visit: Payer: Self-pay | Admitting: Internal Medicine

## 2018-09-05 DIAGNOSIS — E1151 Type 2 diabetes mellitus with diabetic peripheral angiopathy without gangrene: Secondary | ICD-10-CM

## 2018-09-05 DIAGNOSIS — IMO0002 Reserved for concepts with insufficient information to code with codable children: Secondary | ICD-10-CM

## 2018-09-05 DIAGNOSIS — I428 Other cardiomyopathies: Secondary | ICD-10-CM

## 2018-09-05 DIAGNOSIS — I5042 Chronic combined systolic (congestive) and diastolic (congestive) heart failure: Secondary | ICD-10-CM

## 2018-09-05 DIAGNOSIS — I10 Essential (primary) hypertension: Secondary | ICD-10-CM

## 2018-09-05 DIAGNOSIS — E1165 Type 2 diabetes mellitus with hyperglycemia: Secondary | ICD-10-CM

## 2018-09-05 MED ORDER — FUROSEMIDE 40 MG PO TABS: 40.0000 mg | ORAL_TABLET | Freq: Every day | ORAL | 1 refills | Status: DC

## 2018-09-05 MED ORDER — ACCU-CHEK GUIDE W/DEVICE KIT: 1.0000 | PACK | Freq: Three times a day (TID) | 0 refills | Status: DC

## 2018-09-05 MED ORDER — ATORVASTATIN CALCIUM 40 MG PO TABS: 40.0000 mg | ORAL_TABLET | Freq: Every day | ORAL | 3 refills | Status: DC

## 2018-09-05 MED ORDER — ACCU-CHEK FASTCLIX LANCETS MISC: 12 refills | Status: DC

## 2018-09-05 MED ORDER — ISOSORBIDE MONONITRATE ER 120 MG PO TB24: 120.0000 mg | ORAL_TABLET | Freq: Every day | ORAL | 1 refills | Status: DC

## 2018-09-05 MED ORDER — GLUCOSE BLOOD VI STRP: ORAL_STRIP | 12 refills | Status: DC

## 2018-09-05 MED ORDER — POTASSIUM CHLORIDE CRYS ER 20 MEQ PO TBCR: 20.0000 meq | EXTENDED_RELEASE_TABLET | Freq: Every day | ORAL | 1 refills | Status: DC

## 2018-09-05 MED ORDER — BASAGLAR KWIKPEN 100 UNIT/ML ~~LOC~~ SOPN: 15.0000 [IU] | PEN_INJECTOR | Freq: Every day | SUBCUTANEOUS | 1 refills | Status: DC

## 2018-09-05 MED ORDER — CARVEDILOL 25 MG PO TABS: 25.0000 mg | ORAL_TABLET | Freq: Two times a day (BID) | ORAL | 1 refills | Status: DC

## 2018-09-05 MED FILL — POTASSIUM CHLORIDE CRYS ER: 20 | 30 days supply | Qty: 30 | Fill #0

## 2018-09-05 MED FILL — FUROSEMIDE 40 MG TAB: 40 | 90 days supply | Qty: 90 | Fill #0

## 2018-09-05 MED FILL — ACCU-CHEK FASTCLIX LANCETS: 30 days supply | Qty: 102 | Fill #0

## 2018-09-05 MED FILL — CARVEDILOL 25 MG TABLET: 25 | 90 days supply | Qty: 180 | Fill #0

## 2018-09-05 MED FILL — ACCU-CHEK GUIDE TEST STRIP: 30 days supply | Qty: 100 | Fill #0

## 2018-09-05 MED FILL — ATORVASTATIN 40 MG TABLET: 40 | 90 days supply | Qty: 90 | Fill #0

## 2018-09-05 MED FILL — ISOSORBIDE MONONITRATE ER 1: 120 | 30 days supply | Qty: 30 | Fill #0

## 2018-09-05 NOTE — Telephone Encounter (Signed)
Call to [place] [facility] for PA for Canagliflozin-Metformin which was run as Invokanamet by the pharmacy.  Information was given.  Awaiting determination. Patient may need to try and fail [place] and Vania Rea which are the preferred medications by Medicaid.  [id number].  I- [id number].  Sander Nephew, RN 09/05/2018 12:02 PM.

## 2018-09-05 NOTE — Assessment & Plan Note (Signed)
Imdur increased to 120mg  qd by cardiology in March.   - refill Imdur 120 mg qd

## 2018-09-11 ENCOUNTER — Telehealth (HOSPITAL_COMMUNITY): Payer: Self-pay | Admitting: *Deleted

## 2018-09-11 NOTE — Telephone Encounter (Signed)
Attempted both contact numbers listed, no answer and home number is disconnected.    Deliah Boston, RDCS

## 2018-09-12 ENCOUNTER — Telehealth (HOSPITAL_COMMUNITY): Payer: Self-pay | Admitting: Radiology

## 2018-09-12 NOTE — Telephone Encounter (Signed)

## 2018-09-13 ENCOUNTER — Other Ambulatory Visit: Payer: Self-pay

## 2018-09-13 ENCOUNTER — Ambulatory Visit (HOSPITAL_COMMUNITY): Payer: Medicaid Other | Attending: Interventional Cardiology

## 2018-09-13 DIAGNOSIS — I5042 Chronic combined systolic (congestive) and diastolic (congestive) heart failure: Secondary | ICD-10-CM | POA: Diagnosis not present

## 2018-09-20 NOTE — Telephone Encounter (Signed)
Did we ever hear anything on if she was approved for this?

## 2018-09-21 ENCOUNTER — Telehealth: Payer: Self-pay

## 2018-09-21 NOTE — Telephone Encounter (Signed)
YOUR CARDIOLOGY TEAM HAS ARRANGED FOR AN E-VISIT FOR YOUR APPOINTMENT - PLEASE REVIEW IMPORTANT INFORMATION BELOW SEVERAL DAYS PRIOR TO YOUR APPOINTMENT  Due to the recent COVID-19 pandemic, we are transitioning in-person office visits to tele-medicine visits in an effort to decrease unnecessary exposure to our patients, their families, and staff. These visits are billed to your insurance just like a normal visit is. We also encourage you to sign up for MyChart if you have not already done so. You will need a smartphone if possible. For patients that do not have this, we can still complete the visit using a regular telephone but do prefer a smartphone to enable video when possible. You may have a family member that lives with you that can help. If possible, we also ask that you have a blood pressure cuff and scale at home to measure your blood pressure, heart rate and weight prior to your scheduled appointment. Patients with clinical needs that need an in-person evaluation and testing will still be able to come to the office if absolutely necessary. If you have any questions, feel free to call our office.     YOUR PROVIDER WILL BE USING THE FOLLOWING PLATFORM TO COMPLETE YOUR VISIT: Doximity  . IF USING MYCHART - How to Download the MyChart App to Your SmartPhone   - If Apple, go to App Store and type in MyChart in the search bar and download the app. If Android, ask patient to go to Google Play Store and type in MyChart in the search bar and download the app. The app is free but as with any other app downloads, your phone may require you to verify saved payment information or Apple/Android password.  - You will need to then log into the app with your MyChart username and password, and select [facility] as your healthcare provider to link the account.  - When it is time for your visit, go to the MyChart app, find appointments, and click Begin Video Visit. Be sure to Select Allow for your device to  access the Microphone and Camera for your visit. You will then be connected, and your provider will be with you shortly.  **If you have any issues connecting or need assistance, please contact MyChart service desk [phone] ([phone])**  **If using a computer, in order to ensure the best quality for your visit, you will need to use either of the following Internet Browsers: Google Chrome or Microsoft Edge**  . IF USING DOXIMITY or [email or web address].ME - The staff will give you instructions on receiving your link to join the meeting the day of your visit.      2-3 DAYS BEFORE YOUR APPOINTMENT  You will receive a telephone call from one of our [facility] team members - your caller ID may say "Unknown caller." If this is a video visit, we will walk you through how to get the video launched on your phone. We will remind you check your blood pressure, heart rate and weight prior to your scheduled appointment. If you have an Apple Watch or Kardia, please upload any pertinent ECG strips the day before or morning of your appointment to MyChart. Our staff will also make sure you have reviewed the consent and agree to move forward with your scheduled tele-health visit.     THE DAY OF YOUR APPOINTMENT  Approximately 15 minutes prior to your scheduled appointment, you will receive a telephone call from one of HeartCare team - your caller ID may say "Unknown caller."    Our staff will confirm medications, vital signs for the day and any symptoms you may be experiencing. Please have this information available prior to the time of visit start. It may also be helpful for you to have a pad of paper and pen handy for any instructions given during your visit. They will also walk you through joining the smartphone meeting if this is a video visit.    CONSENT FOR TELE-HEALTH VISIT - PLEASE REVIEW  I hereby voluntarily request, consent and authorize [facility] and its employed or contracted physicians, physician  assistants, nurse practitioners or other licensed health care professionals (the Practitioner), to provide me with telemedicine health care services (the "Services") as deemed necessary by the treating Practitioner. I acknowledge and consent to receive the Services by the Practitioner via telemedicine. I understand that the telemedicine visit will involve communicating with the Practitioner through live audiovisual communication technology and the disclosure of certain medical information by electronic transmission. I acknowledge that I have been given the opportunity to request an in-person assessment or other available alternative prior to the telemedicine visit and am voluntarily participating in the telemedicine visit.  I understand that I have the right to withhold or withdraw my consent to the use of telemedicine in the course of my care at any time, without affecting my right to future care or treatment, and that the Practitioner or I may terminate the telemedicine visit at any time. I understand that I have the right to inspect all information obtained and/or recorded in the course of the telemedicine visit and may receive copies of available information for a reasonable fee.  I understand that some of the potential risks of receiving the Services via telemedicine include:  . Delay or interruption in medical evaluation due to technological equipment failure or disruption; . Information transmitted may not be sufficient (e.g. poor resolution of images) to allow for appropriate medical decision making by the Practitioner; and/or  . In rare instances, security protocols could fail, causing a breach of personal health information.  Furthermore, I acknowledge that it is my responsibility to provide information about my medical history, conditions and care that is complete and accurate to the best of my ability. I acknowledge that Practitioner's advice, recommendations, and/or decision may be based on  factors not within their control, such as incomplete or inaccurate data provided by me or distortions of diagnostic images or specimens that may result from electronic transmissions. I understand that the practice of medicine is not an exact science and that Practitioner makes no warranties or guarantees regarding treatment outcomes. I acknowledge that I will receive a copy of this consent concurrently upon execution via email to the email address I last provided but may also request a printed copy by calling the office of [facility].    I understand that my insurance will be billed for this visit.   I have read or had this consent read to me. . I understand the contents of this consent, which adequately explains the benefits and risks of the Services being provided via telemedicine.  . I have been provided ample opportunity to ask questions regarding this consent and the Services and have had my questions answered to my satisfaction. . I give my informed consent for the services to be provided through the use of telemedicine in my medical care  By participating in this telemedicine visit I agree to the above.  

## 2018-09-25 ENCOUNTER — Ambulatory Visit: Payer: Medicaid Other

## 2018-09-26 NOTE — Telephone Encounter (Signed)
Thank you for the update. I will form a new plan with Dr. Maudie Mercury because I know she had some thoughts on medication changes for her.

## 2018-09-26 NOTE — Telephone Encounter (Signed)
Patient will need to try and fail the [place] and the Jardiance.

## 2018-09-28 ENCOUNTER — Telehealth: Payer: Self-pay | Admitting: Pharmacist

## 2018-09-28 DIAGNOSIS — E1151 Type 2 diabetes mellitus with diabetic peripheral angiopathy without gangrene: Secondary | ICD-10-CM

## 2018-09-28 DIAGNOSIS — IMO0002 Reserved for concepts with insufficient information to code with codable children: Secondary | ICD-10-CM

## 2018-09-28 MED ORDER — METFORMIN HCL ER 500 MG PO TB24: 500.0000 mg | ORAL_TABLET | Freq: Two times a day (BID) | ORAL | 11 refills | Status: DC

## 2018-09-28 MED ORDER — LIRAGLUTIDE 18 MG/3ML ~~LOC~~ SOPN: 0.6000 mg | PEN_INJECTOR | Freq: Every day | SUBCUTANEOUS | 11 refills | Status: DC

## 2018-09-28 MED ORDER — EMPAGLIFLOZIN 10 MG PO TABS: 10.0000 mg | ORAL_TABLET | Freq: Every day | ORAL | 11 refills | Status: DC

## 2018-09-28 MED FILL — metFORMIN HCL ER 500 MG TB2: 500 | 30 days supply | Qty: 60 | Fill #0

## 2018-09-28 NOTE — Progress Notes (Signed)
Spoke to patient per PCP approval to help with DM medications. Confirmed identity using date of birth and address. Patient reports currently taking Janumet and Basaglar for diabetes, PCP interested in switching to CV benefiting agents. Unfortunately, medicare would not cover Invokamet, so needing to switch to individual components. Patient is also interested in switching from Helotes to a GLP-1 agent, after discussing benefits of regimen switch, patient prefers to discontinue current DM regimen and switch to CV benefiting regimen of metformin, Jardiance, and Victoza. Education provided on how to take these medications and advised patient to contact clinic if any questions. Patient verbalized understanding by repeat back. Will follow up with patient within 1 week to evaluate safety/efficacy of new regimen and patient agreed.

## 2018-10-02 ENCOUNTER — Telehealth: Payer: Self-pay | Admitting: *Deleted

## 2018-10-02 NOTE — Telephone Encounter (Addendum)
Call to [employer] for PA for Victoza. Information was given  Approved 10/02/2018 thru 09/26/2019.   [facility] was called and informed of the approval 10/02/2018 thru 09/26/2019 .  PA # [id number].  I- [id number].  Sander Nephew, RN 10/02/2018 10:03 AM.  RTC to [place] Flow from [employer].  Spoke with Corene Cornea about approval for the Victozia starting 10/02/2018 then.  CoverMyMeds form to be closed out. Sander Nephew, RN 10/08/2018 12:07 PM

## 2018-10-03 NOTE — Progress Notes (Signed)
Virtual Visit via Video Note   This visit type was conducted due to national recommendations for restrictions regarding the COVID-19 Pandemic (e.g. social distancing) in an effort to limit this patient's exposure and mitigate transmission in our community.  Due to her co-morbid illnesses, this patient is at least at moderate risk for complications without adequate follow up.  This format is felt to be most appropriate for this patient at this time.  All issues noted in this document were discussed and addressed.  A limited physical exam was performed with this format.  Please refer to the patient's chart for her consent to telehealth for [facility]   Date:  10/04/2018   ID:  [name], DOB 09/16/1955, MRN [id number]  Patient Location: Home Provider Location: Home  PCP:  Marty Heck, DO  Cardiologist:  Sinclair Grooms, MD  Electrophysiologist:  None   Evaluation Performed:  Follow-Up Visit  Chief Complaint:  CAD/CHF  History of Present Illness:    [name] is a 63 y.o. female with with a hx of HTN,uncontrolledDM, PVD, chronic diastolic heart failure, prior history of low EF in 2015 of 30-35%, improved EF in 2018, and history of abnormal NST but nopriorhx of cardiac cath. She was admitted 06/01/17-06/05/17 for influenza A. Echo that admission showed reduced LVEF of 25-30%.Shealso had chest pain prior to discharge and was restarted on a lower dose of losartan 50 mg (was on 100 mg). Her discharge weight was 167 lbs. Nuclear stress test on 10/2016 without ischemia.  She is doing better.  Angina has significantly decreased in frequency.  Last sublingual nitroglycerin was greater than 3 weeks ago.  Additionally, she denies dyspnea on exertion and orthopnea.  No lower extremity swelling is noted.  She has received Medicaid and is now able to get therapy.  She is being switched from [place] to [place].  She is not smoking.  She drinks wine but not on a daily basis.   She is active taking care of young grandchildren.  Her daughter is 17 and the patient is caring for her 20-monthold and 255year-old children.  The patient does not have symptoms concerning for COVID-19 infection (fever, chills, cough, or new shortness of breath).    Past Medical History:  Diagnosis Date  . Arthritis    "hands, shoulders, hips, legs" (05/10/2018)  . Arthritis of left hip 08/18/2013   S/p total hip arthroplasty.    . [place] ([place]est[place] heart failure) [place]    [facility]...[facility]  . Chronic lower back pain   . COPD ([place]ronic obstructive pulmonary disease) ([place]   . Healthcare maintenance 06/16/2015  . High cholesterol   . Hypertension   . Influenza A 06/01/2017  . Influenza, pneumonia 06/01/2017  . Left flank pain 10/11/2016  . Myocardial infarction ([facility]    "been told that I've had one; don't know when it would have been" (05/10/2018)  . Problem with sexual relationship 10/13/2017  . PVD [place]peripheral vascular disease) ([place]   . Type II diabetes mellitus ([place]    diagnosed 2001   Past Surgical History:  Procedure Laterality Date  . ABDOMINAL ANGIOGRAM  12/28/2012  . ABDOMINAL ANGIOGRAM N/A 12/29/2011   Procedure: ABDOMINAL ANGIOGRAM;  Surgeon: BConrad Pemberwick MD;  [facility];  Service: Cardiovascular;  Laterality: N/A;  . ABDOMINAL AORTAGRAM N/A 12/21/2012   Procedure: ABDOMINAL AMaxcine Ham  Surgeon: CElam Dutch MD;  [facility];  Service: Cardiovascular;  Laterality: N/A;  . EYE SURGERY  Bilateral ~ 2018   "laser; related to my diabetes"  . JOINT REPLACEMENT    . PERIPHERAL VASCULAR CATHETERIZATION     "had to have some veins unclogged cause I was in pain when I walked" (05/10/2018)  . RIGHT/LEFT HEART CATH AND CORONARY ANGIOGRAPHY N/A 04/30/2018   Procedure: RIGHT/LEFT HEART CATH AND CORONARY ANGIOGRAPHY;  Surgeon: [name], MD;  Location: [place] CV LAB;  Service: Cardiovascular;  Laterality: N/A;  . TOTAL HIP ARTHROPLASTY Left 08/19/2013   Procedure:  TOTAL HIP ARTHROPLASTY;  Surgeon: Kerin Salen, MD;  Location: [place];  Service: Orthopedics;  Laterality: Left;  . TUBAL LIGATION       Current Meds  Medication Sig  . Accu-Chek FastClix Lancets MISC Check [name] sugar up to  3x/day before meals  . albuterol (PROVENTIL HFA) 108 (90 Base) MCG/ACT inhaler Inhale 2 puffs into the lungs every 6 (six) hours as needed for wheezing or shortness of breath.  Marland Kitchen aspirin EC 81 MG tablet Take 1 tablet (81 mg total) by mouth daily. IM program, [facility]  . atorvastatin (LIPITOR) 40 MG tablet Take 1 tablet (40 mg total) by mouth daily.  . [name] glucose meter kit and supplies KIT Dispense based on patient and insurance preference. Use up to four times daily as directed. (FOR ICD-9 250.00, 250.01).  . [name] Glucose Monitoring Suppl (ACCU-CHEK GUIDE) w/Device KIT 1 each by Does not apply route 3 (three) times daily.  . carvedilol (COREG) 25 MG tablet Take 1 tablet (25 mg total) by mouth 2 (two) times daily.  . Cholecalciferol (VITAMIN D3) 125 MCG (5000 UT) CAPS Take 5,000 Units by mouth daily.  . diphenhydrAMINE (BENADRYL) 25 MG tablet Take 25 mg by mouth daily as needed for allergies.  . diphenhydrAMINE HCl, Sleep, (ZZZQUIL) 25 MG CAPS Take 50 mg by mouth at bedtime as needed (sleep).  . empagliflozin (JARDIANCE) 10 MG TABS tablet Take 10 mg by mouth daily.  . famotidine (PEPCID) 20 MG tablet Take 20 mg by mouth daily as needed for heartburn or indigestion.  . furosemide (LASIX) 40 MG tablet Take 1 tablet (40 mg total) by mouth daily.  Marland Kitchen glucose [name] (ACCU-CHEK GUIDE) test strip Check [name] sugar up to 3x/day before meals  . isosorbide mononitrate (IMDUR) 120 MG 24 hr tablet Take 1 tablet (120 mg total) by mouth daily.  Marland Kitchen liraglutide (VICTOZA) 18 MG/3ML SOPN Inject 0.1 mLs (0.6 mg total) into the skin daily. After 1 week, increase to 1.2 mg daily, then after 1 more week increase to 1.8 mg daily  . losartan (COZAAR) 100 MG tablet Take 1 tablet (100 mg total) by  mouth daily.  . metFORMIN (GLUCOPHAGE-XR) 500 MG 24 hr tablet Take 1 tablet (500 mg total) by mouth 2 (two) times daily with a meal.  . Multiple Vitamins-Minerals (MULTIVITAMIN GUMMIES ADULT) CHEW Chew 2 tablets by mouth daily.  . nitroGLYCERIN (NITROSTAT) 0.4 MG SL tablet Place 1 tablet (0.4 mg total) under the tongue every 5 (five) minutes as needed for chest pain.  . potassium chloride SA (K-DUR) 20 MEQ tablet Take 1 tablet (20 mEq total) by mouth daily.     Allergies:   Lisinopril and Plavix [clopidogrel bisulfate]   Social History   Tobacco Use  . Smoking status: Former Smoker    Years: 7.00    Types: Cigarettes    Last attempt to quit: 05/08/1987    Years since quitting: 31.4  . Smokeless tobacco: Never Used  . Tobacco comment:  05/10/2018 "smoked when I drank"  Substance Use Topics  . Alcohol use: Yes    Alcohol/week: 0.0 standard drinks    Comment: 05/10/2018 "glass of wine on holidays"  . Drug use: Not Currently     Family Hx: The patient's family history includes Diabetes in her mother.  ROS:   Please see the history of present illness.    No specific complaints.  Anxiety related to her social situation, COVID-19, and health condition. All other systems reviewed and are negative.   Prior CV studies:   The following studies were reviewed today:  ECHOCARDIOGRAM 2020: IMPRESSIONS    1. The left ventricle has normal systolic function, with an ejection fraction of 55-60%. The cavity size was normal. There is severely increased left ventricular wall thickness. Left ventricular diastolic Doppler parameters are consistent with impaired  relaxation.  2. The right ventricle has normal systolic function. The cavity was normal.  3. Left atrial size was moderately dilated.  4. Trivial pericardial effusion is present.  5. The mitral valve is abnormal. Mild thickening of the mitral valve leaflet. There is moderate mitral annular calcification present.  6. The tricuspid valve is  grossly normal.  7. The aortic valve is tricuspid. Mild calcification of the aortic valve. Aortic valve regurgitation is mild by color flow Doppler. No stenosis of the aortic valve.  8. Normal LV systolic function; severe LVH; mild diastolic dysfunction; moderate  CARDIAC CATH 2019: Diagnostic  Dominance: Right    RECOMMENDATIONS:   Guideline directed therapy for LV systolic dysfunction  Anti-ischemic therapy with long-acting nitrates  Aggressive lipid-lowering  If unable to control symptoms consider referral for surgical revascularization opinion.  Reluctant to refer the patient at this time because LAD has only moderate disease.  Circumflex territory has multiple diffusely diseased segments as is typical in diabetic patients.   Labs/Other Tests and Data Reviewed:    EKG:  No ECG reviewed.  Recent Labs: 05/10/2018: B Natriuretic Peptide 566.4; NT-Pro BNP 2,478 05/21/2018: BUN 41; Creatinine, Ser 1.65; Hemoglobin 11.8; Platelets 335; Potassium 4.8; Sodium 140   Recent Lipid Panel Lab Results  Component Value Date/Time   CHOL 149 08/31/2017 09:41 AM   TRIG 74 08/31/2017 09:41 AM   HDL 60 08/31/2017 09:41 AM   CHOLHDL 2.5 08/31/2017 09:41 AM   CHOLHDL 3.7 11/08/2012 01:31 PM   LDLCALC 74 08/31/2017 09:41 AM    Wt Readings from Last 3 Encounters:  10/04/18 161 lb 3.2 oz (73.1 kg)  06/28/18 162 lb 6.4 oz (73.7 kg)  05/24/18 164 lb 6.4 oz (74.6 kg)     Objective:    Vital Signs:  BP (!) 164/92   Pulse 66   Ht _0  (1.6 m)   Wt 161 lb 3.2 oz (73.1 kg)   BMI 28.56 kg/m    VITAL SIGNS:  reviewed GEN:  no acute distress RESPIRATORY:  normal respiratory effort, symmetric expansion CARDIOVASCULAR:  no peripheral edema NEURO:  alert and oriented x 3, no obvious focal deficit  ASSESSMENT & PLAN:    1. Chronic combined systolic and diastolic CHF, NYHA class 2 ([place])   2. LBBB (left bundle branch block)   3. Essential hypertension   4. Coronary artery disease involving  native heart, angina presence unspecified, unspecified vessel or lesion type   5. Hyperlipidemia, unspecified hyperlipidemia type   6. DM (diabetes mellitus) type II uncontrolled, periph vascular disorder ([place])   7. CKD (chronic kidney disease), stage III ([place])   8. Educated About Covid-19 Virus  Infection    PLAN:  1. 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  The ejection fraction has normalized back to 60%.  Therefore there is no indication for resynchronization therapy.  Plan to continue SGLT2 therapy, beta-blocker therapy, isosorbide, and consider adding hydralazine if [name] pressure becomes a problem.  I was trying to start her on Entresto but with improvement in LV function this is not currently required.  Isosorbide is important because it is helping to control angina. 2. No change 3. BP this morning was prior to taking medication.  Afternoon [name] pressures tend to run less than 130/80 mmHg according to the patient. 4. Rare episodes of angina.  Continue current medical regimen.  Sublingual nitroglycerin if chest pain.  Notification if crescendo pattern. 5. Target LDL less than 70 6. Hemoglobin A1c target less than 7 7. Monitor kidney function.  Most recent laboratory data suggests stage III CKD.  Overall education and awareness concerning primary/secondary risk prevention was discussed in detail: LDL less than 70, hemoglobin A1c less than 7, [name] pressure target less than 130/80 mmHg, >150 minutes of moderate aerobic activity per week, avoidance of smoking, weight control (via diet and exercise), and continued surveillance/management of/for obstructive sleep apnea.   COVID-19 Education: The signs and symptoms of COVID-19 were discussed with the patient and how to seek care for testing (follow up with PCP or arrange E-visit).  The importance of social distancing was discussed today.  Time:   Today, I have spent 20 minutes with the patient with telehealth technology discussing the above  problems.     Medication Adjustments/Labs and Tests Ordered: Current medicines are reviewed at length with the patient today.  Concerns regarding medicines are outlined above.   Tests Ordered: No orders of the defined types were placed in this encounter.   Medication Changes: No orders of the defined types were placed in this encounter.   Disposition:  Follow up in 4 month(s)  Signed, Sinclair Grooms, MD  10/04/2018 9:59 AM    [place]

## 2018-10-04 ENCOUNTER — Encounter: Payer: Self-pay | Admitting: Interventional Cardiology

## 2018-10-04 ENCOUNTER — Other Ambulatory Visit: Payer: Self-pay

## 2018-10-04 ENCOUNTER — Telehealth (INDEPENDENT_AMBULATORY_CARE_PROVIDER_SITE_OTHER): Payer: Medicaid Other | Admitting: Interventional Cardiology

## 2018-10-04 VITALS — BP 164/92 | HR 66 | Ht 63.0 in | Wt 161.2 lb

## 2018-10-04 DIAGNOSIS — N183 Chronic kidney disease, stage 3 unspecified: Secondary | ICD-10-CM

## 2018-10-04 DIAGNOSIS — E785 Hyperlipidemia, unspecified: Secondary | ICD-10-CM

## 2018-10-04 DIAGNOSIS — I447 Left bundle-branch block, unspecified: Secondary | ICD-10-CM

## 2018-10-04 DIAGNOSIS — I5042 Chronic combined systolic (congestive) and diastolic (congestive) heart failure: Secondary | ICD-10-CM | POA: Diagnosis not present

## 2018-10-04 DIAGNOSIS — IMO0002 Reserved for concepts with insufficient information to code with codable children: Secondary | ICD-10-CM

## 2018-10-04 DIAGNOSIS — I2510 Atherosclerotic heart disease of native coronary artery without angina pectoris: Secondary | ICD-10-CM

## 2018-10-04 DIAGNOSIS — E1151 Type 2 diabetes mellitus with diabetic peripheral angiopathy without gangrene: Secondary | ICD-10-CM

## 2018-10-04 DIAGNOSIS — Z7189 Other specified counseling: Secondary | ICD-10-CM

## 2018-10-04 DIAGNOSIS — I10 Essential (primary) hypertension: Secondary | ICD-10-CM

## 2018-10-04 MED FILL — JARDIANCE 10 MG TABLET: 10 | 30 days supply | Qty: 30 | Fill #0

## 2018-10-04 MED FILL — VICTOZA 18 MG/3 ML INJECT P: 18 | 30 days supply | Qty: 9 | Fill #0

## 2018-10-04 NOTE — Patient Instructions (Signed)
Medication Instructions:  Your physician recommends that you continue on your current medications as directed. Please refer to the Current Medication list given to you today.  If you need a refill on your cardiac medications before your next appointment, please call your pharmacy.   Lab work: None If you have labs (blood work) drawn today and your tests are completely normal, you will receive your results only by: Marland Kitchen MyChart Message (if you have MyChart) OR . A paper copy in the mail If you have any lab test that is abnormal or we need to change your treatment, we will call you to review the results.  Testing/Procedures: None  Follow-Up: At [facility], you and your health needs are our priority.  As part of our continuing mission to provide you with exceptional heart care, we have created designated Provider Care Teams.  These Care Teams include your primary Cardiologist (physician) and Advanced Practice Providers (APPs -  Physician Assistants and Nurse Practitioners) who all work together to provide you with the care you need, when you need it. You will need a follow up appointment in 4-6 months.  Please call our office 2 months in advance to schedule this appointment.  You may see Sinclair Grooms, MD or one of the following Advanced Practice Providers on your designated Care Team:   Truitt Merle, NP Cecilie Kicks, NP . Kathyrn Drown, NP  Any Other Special Instructions Will Be Listed Below (If Applicable).  Dr. Tamala Julian would like for you to increase the amount of walking you are doing.

## 2018-10-05 ENCOUNTER — Telehealth: Payer: Self-pay | Admitting: Pharmacist

## 2018-10-05 DIAGNOSIS — IMO0002 Reserved for concepts with insufficient information to code with codable children: Secondary | ICD-10-CM

## 2018-10-05 DIAGNOSIS — E1151 Type 2 diabetes mellitus with diabetic peripheral angiopathy without gangrene: Secondary | ICD-10-CM

## 2018-10-05 NOTE — Progress Notes (Addendum)
Contacted patient to follow up on DM medication changes. Last week, patient was switched from sitagliptin/metformin/insulin glargine to metformin/empagliflozin/liraglutide.  Patient correctly reports DM regimen, states how to titrate the liraglutide. No issues of concern today, patient reports some tiredness which may be unrelated to medications.   Home BG this morning was 124---she reports switching to new DM regimen yesterday. Will call back in 1 week to allow more time to assess. Patient verbalized understanding.

## 2018-10-12 ENCOUNTER — Telehealth: Payer: Self-pay | Admitting: Pharmacist

## 2018-10-12 DIAGNOSIS — E1151 Type 2 diabetes mellitus with diabetic peripheral angiopathy without gangrene: Secondary | ICD-10-CM

## 2018-10-12 DIAGNOSIS — IMO0002 Reserved for concepts with insufficient information to code with codable children: Secondary | ICD-10-CM

## 2018-10-19 MED FILL — LOSARTAN POTASSIUM 100 MG T: 100 | 30 days supply | Qty: 30 | Fill #0

## 2018-10-24 NOTE — Addendum Note (Signed)
Addended by: Forde Dandy on: 10/24/2018 04:10 PM   Modules accepted: Orders

## 2018-10-25 ENCOUNTER — Ambulatory Visit: Payer: Medicaid Other | Admitting: Pharmacist

## 2018-10-25 ENCOUNTER — Other Ambulatory Visit: Payer: Medicaid Other

## 2018-10-31 ENCOUNTER — Other Ambulatory Visit: Payer: Medicaid Other

## 2018-10-31 MED ORDER — DICLOFENAC SODIUM 1 % TD GEL: 2.0000 g | Freq: Four times a day (QID) | TRANSDERMAL | 3 refills | Status: DC

## 2018-10-31 MED FILL — DICLOFENAC SODIUM 1 % GEL: 1 | 13 days supply | Qty: 100 | Fill #0

## 2018-10-31 NOTE — Progress Notes (Signed)
Diabetes Management Follow Up [name] is a 63 y.o. female who was contacted for DM management. Identity was verified using date of birth and address.  Diabetes medications: metformin 500 mg BID, empagliflozin 10 mg, and liraglutide 1.2 mg; patient correctly reports DM regimen and adherence  Home BG average 115 mg/dL (correlates with A1C of around 5.6%) Hypoglycemia symptoms: none, no home BG < 70 mg/dL Hyperglycemia symptoms: none, no home BG > 200 mg/dL   A/P Blood glucose control: good  Reviewed appropriate home blood glucose monitoring  No changes were made today, advised to contact clinic or seek medical attention if concerns or symptoms arise. Patient appointment scheduled for lab and PCP on 11/14/2018. Will follow remotely and assist in patient's care as needed. If stable, will sign off of case.  The patient verbalized understanding of information provided by repeating back concepts discussed.  Follow-up 2 weeks  Flossie Dibble

## 2018-11-06 MED FILL — JARDIANCE 10 MG TABLET: 10 | 30 days supply | Qty: 30 | Fill #1

## 2018-11-14 ENCOUNTER — Other Ambulatory Visit: Payer: Self-pay

## 2018-11-14 ENCOUNTER — Ambulatory Visit (INDEPENDENT_AMBULATORY_CARE_PROVIDER_SITE_OTHER): Payer: Medicaid Other | Admitting: Internal Medicine

## 2018-11-14 ENCOUNTER — Encounter: Payer: Self-pay | Admitting: Internal Medicine

## 2018-11-14 ENCOUNTER — Encounter: Payer: Medicaid Other | Admitting: Internal Medicine

## 2018-11-14 VITALS — BP 110/73 | HR 85 | Temp 97.6°F | Wt 161.6 lb

## 2018-11-14 DIAGNOSIS — I5042 Chronic combined systolic (congestive) and diastolic (congestive) heart failure: Secondary | ICD-10-CM

## 2018-11-14 DIAGNOSIS — I130 Hypertensive heart and chronic kidney disease with heart failure and stage 1 through stage 4 chronic kidney disease, or unspecified chronic kidney disease: Secondary | ICD-10-CM | POA: Diagnosis not present

## 2018-11-14 DIAGNOSIS — N183 Chronic kidney disease, stage 3 (moderate): Secondary | ICD-10-CM | POA: Diagnosis not present

## 2018-11-14 DIAGNOSIS — E876 Hypokalemia: Secondary | ICD-10-CM | POA: Diagnosis not present

## 2018-11-14 DIAGNOSIS — Z7984 Long term (current) use of oral hypoglycemic drugs: Secondary | ICD-10-CM | POA: Diagnosis not present

## 2018-11-14 DIAGNOSIS — E1151 Type 2 diabetes mellitus with diabetic peripheral angiopathy without gangrene: Secondary | ICD-10-CM

## 2018-11-14 DIAGNOSIS — IMO0002 Reserved for concepts with insufficient information to code with codable children: Secondary | ICD-10-CM

## 2018-11-14 DIAGNOSIS — E1122 Type 2 diabetes mellitus with diabetic chronic kidney disease: Secondary | ICD-10-CM

## 2018-11-14 DIAGNOSIS — K219 Gastro-esophageal reflux disease without esophagitis: Secondary | ICD-10-CM | POA: Diagnosis not present

## 2018-11-14 DIAGNOSIS — E1165 Type 2 diabetes mellitus with hyperglycemia: Secondary | ICD-10-CM

## 2018-11-14 DIAGNOSIS — I951 Orthostatic hypotension: Secondary | ICD-10-CM

## 2018-11-14 DIAGNOSIS — D649 Anemia, unspecified: Secondary | ICD-10-CM | POA: Diagnosis not present

## 2018-11-14 DIAGNOSIS — Z79899 Other long term (current) drug therapy: Secondary | ICD-10-CM

## 2018-11-14 DIAGNOSIS — R531 Weakness: Secondary | ICD-10-CM

## 2018-11-14 DIAGNOSIS — N182 Chronic kidney disease, stage 2 (mild): Secondary | ICD-10-CM

## 2018-11-14 DIAGNOSIS — Z0000 Encounter for general adult medical examination without abnormal findings: Secondary | ICD-10-CM

## 2018-11-14 DIAGNOSIS — I10 Essential (primary) hypertension: Secondary | ICD-10-CM

## 2018-11-14 LAB — POCT GLYCOSYLATED HEMOGLOBIN (HGB A1C): Hemoglobin A1C: 6.6 % — AB (ref 4.0–5.6)

## 2018-11-14 LAB — GLUCOSE, CAPILLARY: Glucose-Capillary: 254 mg/dL — ABNORMAL HIGH (ref 70–99)

## 2018-11-14 MED ORDER — FUROSEMIDE 40 MG PO TABS: 20.0000 mg | ORAL_TABLET | ORAL | 1 refills | Status: DC | PRN

## 2018-11-14 MED ORDER — OMEPRAZOLE 20 MG PO CPDR: 20.0000 mg | DELAYED_RELEASE_CAPSULE | Freq: Every day | ORAL | 0 refills | Status: DC

## 2018-11-14 MED ORDER — CARVEDILOL 25 MG PO TABS: 12.5000 mg | ORAL_TABLET | Freq: Two times a day (BID) | ORAL | 1 refills | Status: DC

## 2018-11-14 MED FILL — OMEPRAZOLE 20 MG CPDR: 20 | 30 days supply | Qty: 30 | Fill #0

## 2018-11-14 MED FILL — metFORMIN HCL ER 500 MG TB2: 500 | 30 days supply | Qty: 60 | Fill #0

## 2018-11-14 MED FILL — FUROSEMIDE 40 MG TAB: 40 | 90 days supply | Qty: 45 | Fill #0

## 2018-11-14 MED FILL — CARVEDILOL 25 MG TABLET: 25 | 30 days supply | Qty: 30 | Fill #0

## 2018-11-14 NOTE — Progress Notes (Signed)
   CC: Type II diabetes, discuss medications   HPI:  [name] is a 63 y.o. with PMH as below. The last month she has been feeling weak and very tired all the time. She denies SOB but has intermittent chest tightness for which she takes her nitroglycerin, which helps most of the time. She does get fatigued on exertion and going from sitting to standing. She has had some dizziness as well. Sometimes it just seems random. Usually when this happens she will go lie down and rest.    Please see A&P for assessment of the patient's acute and chronic medical conditions.    Past Medical History:  Diagnosis Date  . Arthritis    "hands, shoulders, hips, legs" (05/10/2018)  . Arthritis of left hip 08/18/2013   S/p total hip arthroplasty.    . CHF (congestive heart failure) ([place])    2014...@ [facility]  . Chronic lower back pain   . COPD (chronic obstructive pulmonary disease) ([place])   . Healthcare maintenance 06/16/2015  . High cholesterol   . Hypertension   . Influenza A 06/01/2017  . Influenza, pneumonia 06/01/2017  . Left flank pain 10/11/2016  . Myocardial infarction [facility])    "been told that I've had one; don't know when it would have been" (05/10/2018)  . Problem with sexual relationship 10/13/2017  . PVD (peripheral vascular disease) ([place])   . Type II diabetes mellitus ([place])    diagnosed 2001   Review of Systems:   Review of Systems  Constitutional: Positive for malaise/fatigue. Negative for chills, fever and weight loss.  Respiratory: Negative for cough, shortness of breath and wheezing.   Cardiovascular: Negative for chest pain, palpitations and leg swelling.  Gastrointestinal: Positive for abdominal pain and heartburn. Negative for blood in stool, constipation, diarrhea, melena, nausea and vomiting.  Genitourinary: Negative for dysuria, frequency and urgency.  Musculoskeletal: Positive for joint pain. Negative for falls.  Neurological: Positive for dizziness and weakness. Negative for  tingling, sensory change, focal weakness, loss of consciousness and headaches.   Physical Exam:  Constitution: NAD, appears stated age  Eyes: no icterus or injection  Cardio: RRR, no murmur, rub or gallop, +pedal & tibial pulses bilaterally  Respiratory: CTA, no wheezing rales or rhonchi  Abdominal: +BS, soft, non-distended,  Neuro: alert & oriented, normal affect, pleasant  Skin: c/d/i    Vitals:   11/14/18 1001  BP: 110/73  Pulse: 85  Temp: 97.6 F (36.4 C)  TempSrc: Oral  SpO2: 99%  Weight: 161 lb 9.6 oz (73.3 kg)     Assessment & Plan:   See Encounters Tab for problem based charting.  Patient discussed with Dr. Angelia Mould

## 2018-11-14 NOTE — Assessment & Plan Note (Addendum)
Switched one month ago from sitagliptin/metformin/insulin to metformin/empagliflozin/liraglutide/victoza. She tests glucose prior to breakfast once per day. Accu-chek shows glucose between 130-146. Last a1c 05/2018 was 7.5 and has decreased to 6.6. She has had some increased fatigue recently, which is much more likely related to chronotropic incompetence and overdiuresis (see orthostatic hypotension) but I will have her track glucose more closely for the next two weeks and f/u.   - f/u two weeks for glucose readings - f/u three months for a1c - BMP  - continue jardiance 10 mg qd, victoza 1.2mg  qd, and metformin 500 mg bid

## 2018-11-14 NOTE — Assessment & Plan Note (Addendum)
Recent ECHO in May showed systolic function improved to 55-60% from 30% 04/2018. Most recent ECHO showed speckled myocardium with consideration for amyloid. She has not yet had in person follow-up with cardiology but recently had a telehealth visit.   - recommend she schedule f/u appointment - also will need preprocedure approval for colonoscopy again as EF has improved  - 2/2 orthostatic hypotension and chronotropic incompetence stopped daily lasix but continued as prn; decreased coreg to 12.5 mg bid - cont losartan and imdur  - BMP  - IFE ordered, will f/u with cardiology as she may need cardiac MRI - f/u two weeks   ADDENDUM: GFR decreased and creatinine elevated, mildly hypokalemic. Also possibly secondary to increased diuresis. I called and confirmed that she had stopped her K supplementation as well, but she continued this the past couple of days. Will stop today. Will have her follow-up for repeat labs next week instead of in two weeks.

## 2018-11-14 NOTE — Assessment & Plan Note (Signed)
  Intermittent left upper quadrant abdominal pain that is associated with eating sometimes and is similar to her GERD. Mildly TTP in LUQ/epigastric region. No palpable spleen. No alarm symptoms. She states she sometimes has heart burn as well for which she intermittently takes protonix.   - explained that protonix needs to be continued for several days to take effect - prescribed 8 week course - reassess symptoms at f/u

## 2018-11-14 NOTE — Assessment & Plan Note (Signed)
-   previously referred to GI for colonoscopy but was not approved through cardiology until her HF was controlled. She is now considered HFiEF (heart failure with improved ejection fraction) - explained to her to schedule f/u and will need approval for colonoscopy. Otherwise can do Fit test.  - mammogram ordered  - pap smear at follow-up in two weeks

## 2018-11-14 NOTE — Assessment & Plan Note (Signed)
BP Readings from Last 3 Encounters:  11/14/18 110/73  10/04/18 (!) 164/92  06/28/18 128/68  Taking coreg, lasix, imdur, and losartan with symptoms of orthostatic hypotension. See Problem for details  - change lasix to prn and decrease coreg to 12.5 mg bid due to orthostatic hypotension    - continue losartan 100 mg qd and imdur 24hr 120 mg qd

## 2018-11-14 NOTE — Patient Instructions (Addendum)
Thank you for allowing [place] to provide your care today. Today we discussed your healthcare maintenance, type II diabetes and symptoms of fatigue  I have ordered the following labs for you:  Complete blood count, complete metabolic panel, and Immunofixation   I will call if any are abnormal.    Today we made the following changes to your medications:   Please START taking:   Pantoprazole (prilosec) 20 mg tablet once per day for the next 8 weeks.   Please CHANGE how you're taking  Lasix (furosemide) 40 mg tablet every day - you can take a half to one tablet as needed if you have symptoms with leg swelling or shortness of breath.   Decrease carvedilol (COREG) from 1 tablet two times per day and start taking a 1/2 tablet two times per day.  Please check you glucose at least two times per day before breakfast and before lunch or dinner.    Please call you cardiologist to schedule a follow-up appointment for referral to colonoscopy.   Please follow-up in two weeks to recheck blood pressure and get your pap smear.    Should you have any questions or concerns please call the internal medicine clinic at [phone].

## 2018-11-15 LAB — CBC
Hematocrit: 35.3 % (ref 34.0–46.6)
Hemoglobin: 11.6 g/dL (ref 11.1–15.9)
MCH: 29.7 pg (ref 26.6–33.0)
MCHC: 32.9 g/dL (ref 31.5–35.7)
MCV: 91 fL (ref 79–97)
Platelets: 218 10*3/uL (ref 150–450)
RBC: 3.9 x10E6/uL (ref 3.77–5.28)
RDW: 14.5 % (ref 11.7–15.4)
WBC: 4.7 10*3/uL (ref 3.4–10.8)

## 2018-11-15 LAB — CMP14 + ANION GAP
ALT: 23 IU/L (ref 0–32)
AST: 25 IU/L (ref 0–40)
Albumin/Globulin Ratio: 1.5 (ref 1.2–2.2)
Albumin: 4.2 g/dL (ref 3.8–4.8)
Alkaline Phosphatase: 95 IU/L (ref 39–117)
Anion Gap: 18 mmol/L (ref 10.0–18.0)
BUN/Creatinine Ratio: 17 (ref 12–28)
BUN: 39 mg/dL — ABNORMAL HIGH (ref 8–27)
Bilirubin Total: 0.2 mg/dL (ref 0.0–1.2)
CO2: 17 mmol/L — ABNORMAL LOW (ref 20–29)
Calcium: 9.8 mg/dL (ref 8.7–10.3)
Chloride: 106 mmol/L (ref 96–106)
Creatinine, Ser: 2.29 mg/dL — ABNORMAL HIGH (ref 0.57–1.00)
GFR calc Af Amer: 26 mL/min/{1.73_m2} — ABNORMAL LOW (ref >59)
GFR calc non Af Amer: 22 mL/min/{1.73_m2} — ABNORMAL LOW (ref >59)
Globulin, Total: 2.8 g/dL (ref 1.5–4.5)
Glucose: 201 mg/dL — ABNORMAL HIGH (ref 65–99)
Potassium: 5.3 mmol/L — ABNORMAL HIGH (ref 3.5–5.2)
Sodium: 141 mmol/L (ref 134–144)
Total Protein: 7 g/dL (ref 6.0–8.5)

## 2018-11-15 NOTE — Progress Notes (Signed)
Internal Medicine Clinic Attending  Case discussed with Dr. Sharon Seller at the time of the visit.  We reviewed the resident's history and exam and pertinent patient test results.  I agree with the assessment, diagnosis, and plan of care documented in the resident's note.   As noted suspect addition of jardiance has lead to additional diuresis which has lead to her orthostatic hypotension, in addition no change in HR with drop in BP>> suspect chronotropic incompetence. Thus will change her lasix to PRN for weight gain/edema, and reduce coreg to 12.5mg  BID.

## 2018-11-16 LAB — IMMUNOFIXATION, SERUM
IgA/Immunoglobulin A, Serum: 199 mg/dL (ref 87–352)
IgG (Immunoglobin G), Serum: 1331 mg/dL (ref 586–1602)
IgM (Immunoglobulin M), Srm: 89 mg/dL (ref 26–217)

## 2018-11-20 MED FILL — VICTOZA 18 MG/3 ML INJECT P: 18 | 30 days supply | Qty: 9 | Fill #1

## 2018-11-20 MED FILL — LOSARTAN POTASSIUM 100 MG T: 100 | 30 days supply | Qty: 30 | Fill #1

## 2018-11-21 ENCOUNTER — Ambulatory Visit (INDEPENDENT_AMBULATORY_CARE_PROVIDER_SITE_OTHER): Payer: Medicaid Other | Admitting: Internal Medicine

## 2018-11-21 ENCOUNTER — Other Ambulatory Visit: Payer: Self-pay

## 2018-11-21 ENCOUNTER — Ambulatory Visit: Payer: Medicaid Other

## 2018-11-21 ENCOUNTER — Encounter: Payer: Self-pay | Admitting: Internal Medicine

## 2018-11-21 ENCOUNTER — Other Ambulatory Visit (HOSPITAL_COMMUNITY)
Admission: RE | Admit: 2018-11-21 | Discharge: 2018-11-21 | Disposition: A | Discharge status: Home / Self Care | Payer: Medicaid Other | Source: Ambulatory Visit | Attending: Internal Medicine | Admitting: Internal Medicine

## 2018-11-21 VITALS — BP 126/68 | HR 84 | Temp 98.2°F | Ht 63.0 in | Wt 160.5 lb

## 2018-11-21 DIAGNOSIS — M1611 Unilateral primary osteoarthritis, right hip: Secondary | ICD-10-CM

## 2018-11-21 DIAGNOSIS — N183 Chronic kidney disease, stage 3 unspecified: Secondary | ICD-10-CM

## 2018-11-21 DIAGNOSIS — I951 Orthostatic hypotension: Secondary | ICD-10-CM | POA: Diagnosis not present

## 2018-11-21 DIAGNOSIS — Z0000 Encounter for general adult medical examination without abnormal findings: Secondary | ICD-10-CM

## 2018-11-21 DIAGNOSIS — Z124 Encounter for screening for malignant neoplasm of cervix: Secondary | ICD-10-CM | POA: Insufficient documentation

## 2018-11-21 DIAGNOSIS — E1151 Type 2 diabetes mellitus with diabetic peripheral angiopathy without gangrene: Secondary | ICD-10-CM

## 2018-11-21 DIAGNOSIS — N812 Incomplete uterovaginal prolapse: Secondary | ICD-10-CM | POA: Diagnosis not present

## 2018-11-21 DIAGNOSIS — N182 Chronic kidney disease, stage 2 (mild): Secondary | ICD-10-CM

## 2018-11-21 DIAGNOSIS — Z79899 Other long term (current) drug therapy: Secondary | ICD-10-CM | POA: Diagnosis not present

## 2018-11-21 DIAGNOSIS — E1122 Type 2 diabetes mellitus with diabetic chronic kidney disease: Secondary | ICD-10-CM | POA: Diagnosis not present

## 2018-11-21 DIAGNOSIS — IMO0002 Reserved for concepts with insufficient information to code with codable children: Secondary | ICD-10-CM

## 2018-11-21 DIAGNOSIS — Z7984 Long term (current) use of oral hypoglycemic drugs: Secondary | ICD-10-CM | POA: Diagnosis not present

## 2018-11-21 LAB — GLUCOSE, CAPILLARY: Glucose-Capillary: 99 mg/dL (ref 70–99)

## 2018-11-21 NOTE — Progress Notes (Signed)
   CC: pap smear, hip pain  HPI:  [name] is a 63 y.o. with PMH as below.   Please see A&P for assessment of the patient's acute and chronic medical conditions.    Past Medical History:  Diagnosis Date  . Arthritis    "hands, shoulders, hips, legs" (05/10/2018)  . Arthritis of left hip 08/18/2013   S/p total hip arthroplasty.    . CHF (congestive heart failure) ([place])    2014...@ [facility]  . Chronic lower back pain   . COPD (chronic obstructive pulmonary disease) ([place])   . Healthcare maintenance 06/16/2015  . High cholesterol   . Hypertension   . Influenza A 06/01/2017  . Influenza, pneumonia 06/01/2017  . Left flank pain 10/11/2016  . Myocardial infarction [facility])    "been told that I've had one; don't know when it would have been" (05/10/2018)  . Problem with sexual relationship 10/13/2017  . PVD (peripheral vascular disease) ([place])   . Type II diabetes mellitus ([place])    diagnosed 2001   Review of Systems:   Review of Systems  Genitourinary: Negative for dysuria, frequency and urgency.  Musculoskeletal: Positive for joint pain. Negative for back pain and falls.  Neurological: Positive for weakness. Negative for tingling, sensory change and focal weakness.   Physical Exam:  Constitution: NAD, appears stated age MSK: difficulty ambulating favoring left leg, pain with passive hip abduction GU: incomplete uterine prolapse, no palpable masses, NTTP Skin: c/d/i    Vitals:   11/21/18 1504  BP: 126/68  Pulse: 84  Temp: 98.2 F (36.8 C)  TempSrc: Oral  SpO2: 100%  Weight: 160 lb 8 oz (72.8 kg)  Height: 5\' 3"  (1.6 m)     Assessment & Plan:   See Encounters Tab for problem based charting.  Patient discussed with Dr. Angelia Mould

## 2018-11-21 NOTE — Patient Instructions (Signed)
Thank you for allowing [place] to provide your care today. Today we discussed your type II diabetes, arthritis, and did your pap smear. I will call with the results of your pap smear.   I have ordered the following labs for you:  Basic metabolic panel    I will call if any are abnormal.    Today we made the following changes to your medications:    Please follow-up in one month for blood pressure check and labs.    Should you have any questions or concerns please call the internal medicine clinic at [phone].

## 2018-11-22 LAB — BMP8+ANION GAP
Anion Gap: 16 mmol/L (ref 10.0–18.0)
BUN/Creatinine Ratio: 15 (ref 12–28)
BUN: 32 mg/dL — ABNORMAL HIGH (ref 8–27)
CO2: 18 mmol/L — ABNORMAL LOW (ref 20–29)
Calcium: 9.6 mg/dL (ref 8.7–10.3)
Chloride: 110 mmol/L — ABNORMAL HIGH (ref 96–106)
Creatinine, Ser: 2.11 mg/dL — ABNORMAL HIGH (ref 0.57–1.00)
GFR calc Af Amer: 28 mL/min/{1.73_m2} — ABNORMAL LOW (ref >59)
GFR calc non Af Amer: 25 mL/min/{1.73_m2} — ABNORMAL LOW (ref >59)
Glucose: 109 mg/dL — ABNORMAL HIGH (ref 65–99)
Potassium: 4.7 mmol/L (ref 3.5–5.2)
Sodium: 144 mmol/L (ref 134–144)

## 2018-11-23 DIAGNOSIS — N812 Incomplete uterovaginal prolapse: Secondary | ICD-10-CM

## 2018-11-23 HISTORY — DX: Incomplete uterovaginal prolapse: N81.2

## 2018-11-23 NOTE — Assessment & Plan Note (Signed)
Stopped her lasix 40 mg qd and decreased her metoprolol one week ago due to symptoms of fatigue and dizziness with diagnosed orthostatic hypotension. She is feeling much better since holding these medications and has only had one episode of dizziness and fatigue. Blood pressure stable.    - continue lasix prn - continue to hold potassium  - continue metoprolol 12.5 mg bid, losartan, and imdur - f/u cardiology

## 2018-11-23 NOTE — Progress Notes (Signed)
Internal Medicine Clinic Attending  Case discussed with Dr. Seawell at the time of the visit.  We reviewed the resident's history and exam and pertinent patient test results.  I agree with the assessment, diagnosis, and plan of care documented in the resident's note.    

## 2018-11-23 NOTE — Assessment & Plan Note (Addendum)
-   Pap smear today - uterine prolapse making visualization of the cervix difficult, repeat pap one year

## 2018-11-23 NOTE — Assessment & Plan Note (Signed)
GFR 22 at recent visit with elevated creatinine, secondary to over diuresis. Also possibly related to starting [place] recently. I expect her GFR to continue trending back up, but if it continues to be decreased will discontinue empagliflozin and increase liraglutide.

## 2018-11-23 NOTE — Assessment & Plan Note (Signed)
Hip arthritis has been starting to flare again. She previously did PT, which helped, but no longer qualified last year. Now that she is on medicaid we should be able to start PT again. Also requesting tramadol, which she states helped and was on prn previously, will hold at this time considering her renal function.   - she has voltaren gel and this helps sometimes, alternate ice and heat  - tylenol prn - referral to PT

## 2018-11-23 NOTE — Assessment & Plan Note (Signed)
Here for pap smear today. She has incomplete uterine prolapse widening the vaginal opening and making visualization of her cervix difficult on exam. No palpable masses. Area is non-tender. She states she is pretty sure it has been like this for a long time as her obgyn mentioned it on previous pap smear years ago, and she has felt it. It does not hurt or bother her at this time. She has occasional incontinence when she laughs, but not all the time.   - repeat pap one year  - continue to monitor unless protrusion occurs or this begins to bother her

## 2018-11-24 LAB — CYTOLOGY - PAP
Adequacy: ABSENT
Diagnosis: NEGATIVE
HPV: NOT DETECTED

## 2018-11-28 ENCOUNTER — Encounter: Payer: Medicaid Other | Admitting: Internal Medicine

## 2018-12-03 ENCOUNTER — Telehealth: Payer: Self-pay | Admitting: Dietician

## 2018-12-04 ENCOUNTER — Telehealth: Payer: Self-pay

## 2018-12-04 NOTE — Telephone Encounter (Signed)
Called to follow up with patient on medication changes at 7/22 visit and any concerning symptoms. She reported feeling much better, no symptoms and blood glucose values between 120-140 most days. Pharmacy sign off.

## 2018-12-05 ENCOUNTER — Encounter: Payer: Self-pay | Admitting: *Deleted

## 2018-12-10 NOTE — Addendum Note (Signed)
Addended by: KIM, JENNIFER J on: 12/10/2018 09:07 AM   Modules accepted: Orders  

## 2018-12-11 IMAGING — DX DG CHEST 1V PORT
1 series · 1 of 1 positions shown · non-contrast
Comparison: 06/02/2017

CLINICAL DATA: Short of breath

EXAM:
PORTABLE CHEST 1 VIEW

[chest ap]
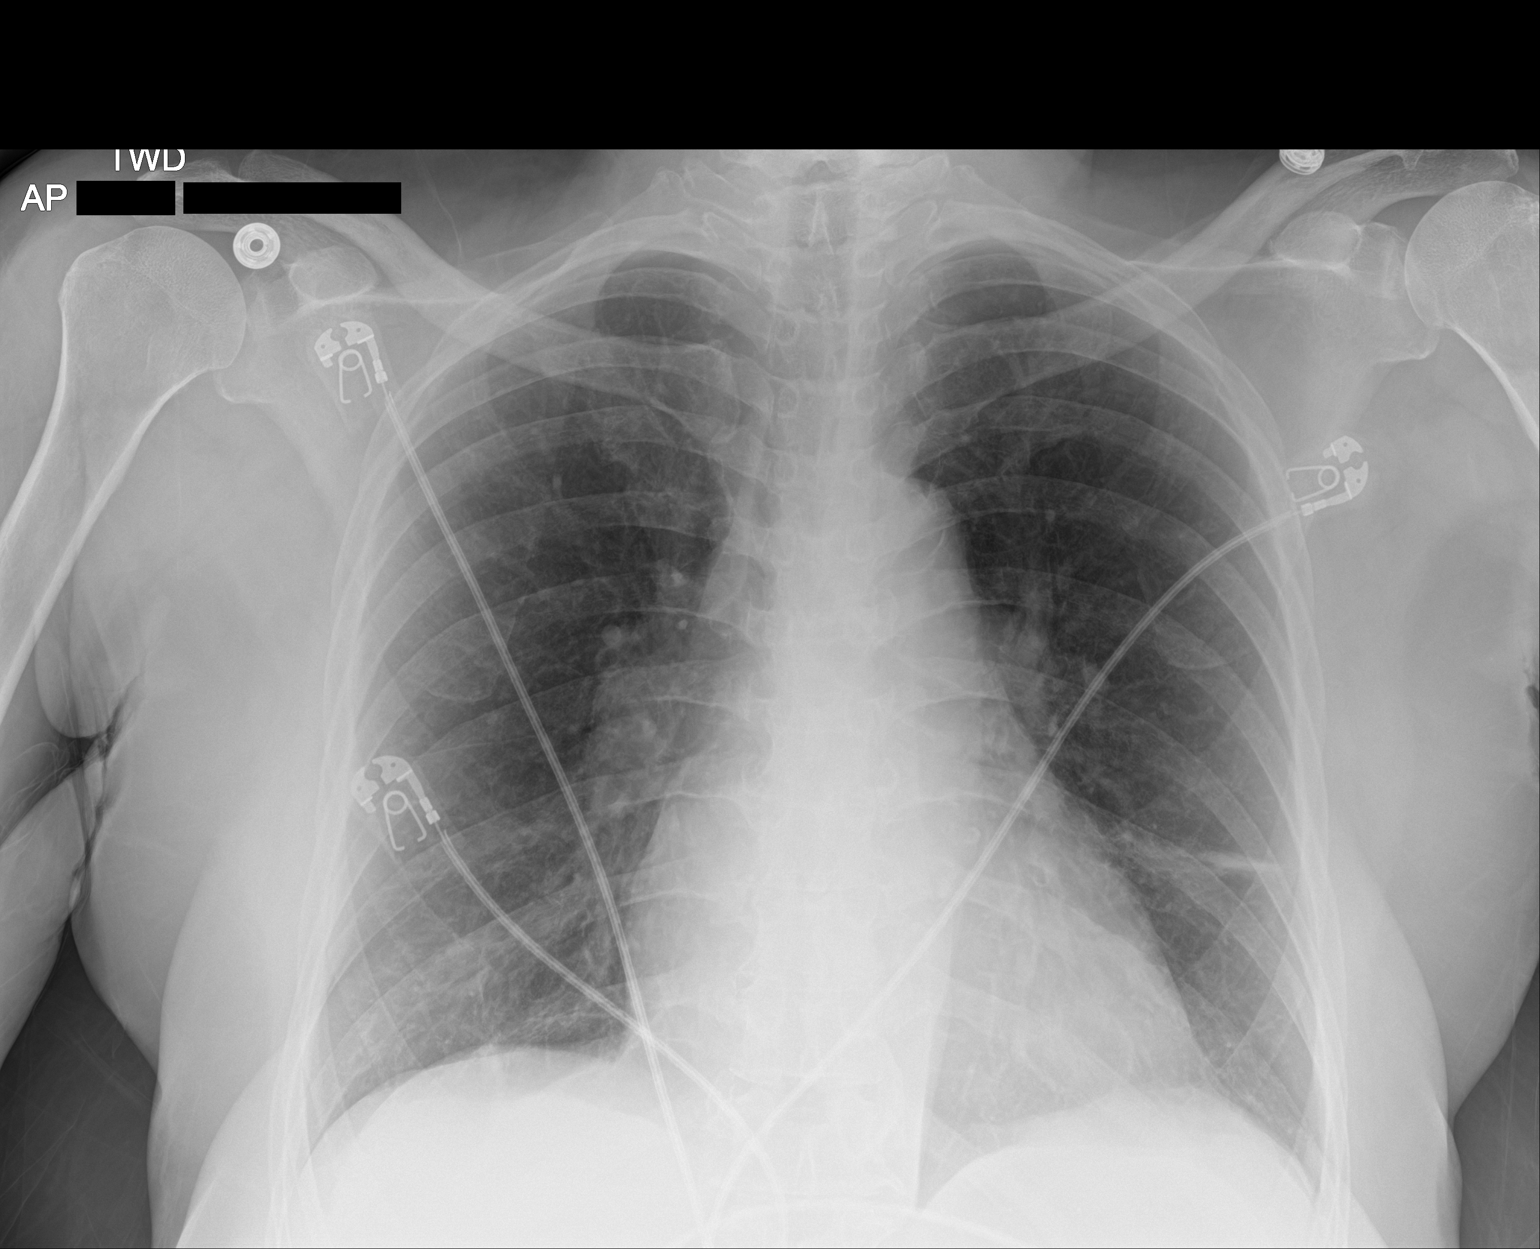

[1 of 1 positions shown; findings below may reference images not displayed]

FINDINGS: Improved aeration since the prior study. Minimal residual airspace
disease in the lung bases bilaterally. Negative for heart failure or
effusion.
IMPRESSION: Near complete clearing of bibasilar airspace disease. Negative for
heart failure.

## 2018-12-11 MED FILL — ISOSORBIDE MN ER 120 MG TAB: 120 | 30 days supply | Qty: 30 | Fill #0

## 2018-12-11 MED FILL — JARDIANCE 10 MG TABLET: 10 | 30 days supply | Qty: 30 | Fill #2

## 2018-12-11 MED FILL — ATORVASTATIN 40 MG TABLET: 40 | 90 days supply | Qty: 90 | Fill #0

## 2018-12-12 MED FILL — LOSARTAN POTASSIUM 100 MG T: 100 | 30 days supply | Qty: 30 | Fill #2

## 2018-12-13 ENCOUNTER — Ambulatory Visit: Payer: Medicaid Other

## 2018-12-13 NOTE — Telephone Encounter (Signed)
[name] is a 63 y.o. female who was contacted on behalf of [facility].   Diabetes Assessment  DM meds and BS checks -  "What medications are you taking for diabetes?" victoza 1.8 daily,metformin twice, jardiance -  "How often do you check your blood sugars at home?" daily - "What have your blood sugars been?" 169 is the highest, mostly less than 150, 121/130/140.  weight okay, appetite good, not like it was, more control- breakfast, lunch and dinner and does not need snacks anymore  High Blood Pressure Assessment  BP meds & BP checks -  "What medications are you taking for high blood pressure?" carvedolol,losartan - "How often do you check your blood pressure at home?" yes, every other day - "What have your blood pressure readings been?" 126/70s  Coping with DM and BP - What else are you doing to help with your DM and BP - Diet? Well balanced Exercise? Starting rehab for hips has to call them  Medication Access Issues  Medication Issues? -  "Are you getting your medicines refilled on time without skipping any doses?" yes - "Are you having any problems getting/taking your meds (cost, timing, transportation)?" no - Do you need any meds refilled? no  Conclusion  Close the call - Date of follow-up visit scheduled- confirmed - "Any other questions or concerns?" no   Eye doctor- in 2017 [name] recommended her to [name] says she went to him did surgery and did all the follow up. Last visit there was 2017.Records being faxed. She wants to go to a doctor for laser-assisted cataract surgery. [name] agreed to allow me to schedule an appointment for her. [name] Wednesday 01/09/19 at 215 pm to discuss.  [name], RD 12/13/2018 10:38 AM.

## 2018-12-17 MED FILL — CARVEDILOL 25 MG TABLET: 25 | 30 days supply | Qty: 30 | Fill #1

## 2018-12-17 MED FILL — VICTOZA 18 MG/3 ML INJECT P: 18 | 30 days supply | Qty: 9 | Fill #2

## 2018-12-17 MED FILL — metFORMIN HCL ER 500 MG TB2: 500 | 30 days supply | Qty: 60 | Fill #1

## 2018-12-17 MED FILL — OMEPRAZOLE 20 MG CAPSULE DR: 20 | 26 days supply | Qty: 26 | Fill #1

## 2018-12-18 MED FILL — UNIFINE PENTIPS 31GX3/16: 31G X 5 MM | 30 days supply | Qty: 100 | Fill #0

## 2018-12-19 ENCOUNTER — Encounter: Payer: Self-pay | Admitting: Internal Medicine

## 2018-12-19 ENCOUNTER — Other Ambulatory Visit: Payer: Self-pay

## 2018-12-19 ENCOUNTER — Ambulatory Visit: Payer: Medicaid Other | Admitting: Internal Medicine

## 2018-12-19 VITALS — BP 154/79 | HR 98 | Temp 98.2°F | Wt 163.7 lb

## 2018-12-19 DIAGNOSIS — Z7984 Long term (current) use of oral hypoglycemic drugs: Secondary | ICD-10-CM

## 2018-12-19 DIAGNOSIS — I129 Hypertensive chronic kidney disease with stage 1 through stage 4 chronic kidney disease, or unspecified chronic kidney disease: Secondary | ICD-10-CM

## 2018-12-19 DIAGNOSIS — E1151 Type 2 diabetes mellitus with diabetic peripheral angiopathy without gangrene: Secondary | ICD-10-CM

## 2018-12-19 DIAGNOSIS — E1122 Type 2 diabetes mellitus with diabetic chronic kidney disease: Secondary | ICD-10-CM

## 2018-12-19 DIAGNOSIS — N182 Chronic kidney disease, stage 2 (mild): Secondary | ICD-10-CM | POA: Diagnosis not present

## 2018-12-19 DIAGNOSIS — E1165 Type 2 diabetes mellitus with hyperglycemia: Secondary | ICD-10-CM | POA: Diagnosis not present

## 2018-12-19 DIAGNOSIS — Z0000 Encounter for general adult medical examination without abnormal findings: Secondary | ICD-10-CM

## 2018-12-19 DIAGNOSIS — N183 Chronic kidney disease, stage 3 (moderate): Secondary | ICD-10-CM | POA: Diagnosis not present

## 2018-12-19 DIAGNOSIS — Z79899 Other long term (current) drug therapy: Secondary | ICD-10-CM | POA: Diagnosis not present

## 2018-12-19 DIAGNOSIS — IMO0002 Reserved for concepts with insufficient information to code with codable children: Secondary | ICD-10-CM

## 2018-12-19 DIAGNOSIS — I10 Essential (primary) hypertension: Secondary | ICD-10-CM

## 2018-12-19 NOTE — Assessment & Plan Note (Addendum)
Repeat BMP today as her GFR has been decreased, thought to be due to overdiuresis. If she continues to have decreased GFR will d/c jardiance and increase liraglutide. Glucometer today shows that her glucose readings have been consistently in range from ~100-135.   - bmp today.  - will F/u labs.  - f/u A1c 2 months   ADDENDUM: Cr continues to slowly improve. Will have her follow-up for labs only one month and cont current meds.

## 2018-12-19 NOTE — Patient Instructions (Signed)
Thank you for allowing [place] to provide your care today. Today we discussed your chronic kidney disease and type II diabetes  I have ordered the following labs for you:  Basic metabolic panel.    I will call if any are abnormal.     Please follow-up in two months for hemoglobin a1c check.   Please call the internal medicine center clinic if you have any questions or concerns, we may be able to help and keep you from a long and expensive emergency room wait. Our clinic and after hours phone number is [phone], the best time to call is Monday through Friday 9 am to 4 pm but there is always someone available 24/7 if you have an emergency. If you need medication refills please notify your pharmacy one week in advance and they will send [place] a request.

## 2018-12-19 NOTE — Assessment & Plan Note (Signed)
Repeat BMP today for recent decrease GFR secondary to overdiuresis with jardiance and lasix.

## 2018-12-19 NOTE — Assessment & Plan Note (Signed)
Follow-up with cardiology to determine if she can complete colonoscopy.

## 2018-12-19 NOTE — Assessment & Plan Note (Addendum)
BP Readings from Last 3 Encounters:  12/19/18 (!) 154/79  11/21/18 126/68  11/14/18 110/73   Recent orthostatic hypotension after improvement in her EF on echo. She is now on lasix 40mg  prn, metoprolol 12.5 mg bid and imdur 120 mg q24h, and losartan 100 mg qd. She has not had any more episodes of dizziness or weakness. She has not had to use the lasix for LE swelling or SOB. Blood pressure is somewhat elevated today. Will recheck at follow-up and may need ot reintroduce some medications.   - bmp today - continue current medications - f/u with cardiology

## 2018-12-19 NOTE — Progress Notes (Signed)
   CC: Stage III CKD  HPI:  [name] is a 63 y.o. with PMH as below.   Please see A&P for assessment of the patient's acute and chronic medical conditions.   Past Medical History:  Diagnosis Date  . Arthritis    "hands, shoulders, hips, legs" (05/10/2018)  . Arthritis of left hip 08/18/2013   S/p total hip arthroplasty.    . CHF (congestive heart failure) ([place])    2014...@ [facility]  . Chronic lower back pain   . COPD (chronic obstructive pulmonary disease) ([place])   . Healthcare maintenance 06/16/2015  . High cholesterol   . Hypertension   . Influenza A 06/01/2017  . Influenza, pneumonia 06/01/2017  . Left flank pain 10/11/2016  . Myocardial infarction [facility])    "been told that I've had one; don't know when it would have been" (05/10/2018)  . Problem with sexual relationship 10/13/2017  . PVD (peripheral vascular disease) ([place])   . Type II diabetes mellitus ([place])    diagnosed 2001   Review of Systems:   Review of Systems  Respiratory: Negative for shortness of breath and wheezing.   Cardiovascular: Negative for chest pain, palpitations and leg swelling.  Genitourinary: Negative for frequency and urgency.  Neurological: Negative for dizziness, focal weakness and weakness.   Physical Exam:  Constitution: NAD, appears stated age, pleasant  Cardio: RRR, no m/r/g; no LE edema  Respiratory: CTA, no w/r/r  Neuro: a&ox3, normal affect   Vitals:   12/19/18 0946  BP: (!) 154/79  Pulse: 98  Temp: 98.2 F (36.8 C)  TempSrc: Oral  SpO2: 99%  Weight: 163 lb 11.2 oz (74.3 kg)     Assessment & Plan:   See Encounters Tab for problem based charting.  Patient discussed with Dr. Dareen Piano

## 2018-12-20 LAB — BMP8+ANION GAP
Anion Gap: 16 mmol/L (ref 10.0–18.0)
BUN/Creatinine Ratio: 17 (ref 12–28)
BUN: 35 mg/dL — ABNORMAL HIGH (ref 8–27)
CO2: 19 mmol/L — ABNORMAL LOW (ref 20–29)
Calcium: 9.1 mg/dL (ref 8.7–10.3)
Chloride: 109 mmol/L — ABNORMAL HIGH (ref 96–106)
Creatinine, Ser: 2.01 mg/dL — ABNORMAL HIGH (ref 0.57–1.00)
GFR calc Af Amer: 30 mL/min/{1.73_m2} — ABNORMAL LOW (ref >59)
GFR calc non Af Amer: 26 mL/min/{1.73_m2} — ABNORMAL LOW (ref >59)
Glucose: 174 mg/dL — ABNORMAL HIGH (ref 65–99)
Potassium: 4.3 mmol/L (ref 3.5–5.2)
Sodium: 144 mmol/L (ref 134–144)

## 2018-12-20 NOTE — Addendum Note (Signed)
Addended by: [name] on: 12/20/2018 01:53 PM   Modules accepted: Orders

## 2018-12-20 NOTE — Progress Notes (Signed)
Internal Medicine Clinic Attending  Case discussed with Dr. Seawell at the time of the visit.  We reviewed the resident's history and exam and pertinent patient test results.  I agree with the assessment, diagnosis, and plan of care documented in the resident's note.    

## 2018-12-24 ENCOUNTER — Telehealth: Payer: Self-pay | Admitting: Internal Medicine

## 2018-12-24 NOTE — Telephone Encounter (Signed)
Pt is calling regarding results [id number]

## 2018-12-24 NOTE — Telephone Encounter (Signed)
Called but she did not answer, left VM. If she calls back her results show that her kidney function is stable and continues to slowly improve but I am having her follow-up in one month for labs to confirm that it continues to improve.

## 2018-12-25 NOTE — Telephone Encounter (Signed)
Pt missed call from Dr. Sharon Seller on yesterday.  Pt would ilke a call back about her Lab results.

## 2018-12-25 NOTE — Telephone Encounter (Signed)
rtc to pt and gave her dr seawell's message, she will f/u as ask

## 2019-01-03 NOTE — Addendum Note (Signed)
Addended by: Hulan Fray on: 01/03/2019 06:17 PM   Modules accepted: Orders

## 2019-01-10 ENCOUNTER — Other Ambulatory Visit: Payer: Self-pay | Admitting: Internal Medicine

## 2019-01-10 DIAGNOSIS — K219 Gastro-esophageal reflux disease without esophagitis: Secondary | ICD-10-CM

## 2019-01-10 MED FILL — ISOSORBIDE MN ER 120 MG TAB: 120 | 30 days supply | Qty: 30 | Fill #1

## 2019-01-10 MED FILL — ACCU-CHEK GUIDE TEST STRIP: 30 days supply | Qty: 100 | Fill #0

## 2019-01-10 MED FILL — JARDIANCE 10 MG TABLET: 10 | 30 days supply | Qty: 30 | Fill #3

## 2019-01-10 NOTE — Telephone Encounter (Signed)
Pt is calling back regarding omeprazole (PRILOSEC) 20 MG capsule  ;pt contact [place], [place] - [address] [facility].

## 2019-01-11 MED FILL — OMEPRAZOLE 20 MG CAPSULE DR: 20 | 30 days supply | Qty: 30 | Fill #0

## 2019-01-17 ENCOUNTER — Other Ambulatory Visit: Payer: Medicaid Other

## 2019-01-17 MED FILL — METFORMIN HCL ER 500 MG TAB: 500 | 30 days supply | Qty: 60 | Fill #2

## 2019-01-21 MED FILL — LOSARTAN POTASSIUM 100 MG T: 100 | 30 days supply | Qty: 30 | Fill #3

## 2019-01-23 DIAGNOSIS — H40033 Anatomical narrow angle, bilateral: Secondary | ICD-10-CM | POA: Diagnosis not present

## 2019-01-23 DIAGNOSIS — H2513 Age-related nuclear cataract, bilateral: Secondary | ICD-10-CM | POA: Diagnosis not present

## 2019-01-23 DIAGNOSIS — E113593 Type 2 diabetes mellitus with proliferative diabetic retinopathy without macular edema, bilateral: Secondary | ICD-10-CM | POA: Diagnosis not present

## 2019-01-23 LAB — HM DIABETES EYE EXAM

## 2019-01-24 MED FILL — OFLOXACIN 0.3% EYE DROPS: 0.3 | 25 days supply | Qty: 5 | Fill #0

## 2019-01-24 MED FILL — PREDNISOLONE AC 1% EYE DROP: 1 | 25 days supply | Qty: 5 | Fill #0

## 2019-01-24 MED FILL — KETOROLAC 0.4% OPHTH SOLN: 0.4 | 25 days supply | Qty: 5 | Fill #0

## 2019-01-31 MED FILL — VICTOZA 18 MG/3 ML INJECT P: 18 | 30 days supply | Qty: 9 | Fill #3

## 2019-02-07 ENCOUNTER — Telehealth: Payer: Self-pay | Admitting: Interventional Cardiology

## 2019-02-07 NOTE — Telephone Encounter (Signed)
° °  [facility] Pre-operative Risk Assessment    Request for surgical clearance:  1. What type of surgery is being performed? Cataract Surgery (R eye)  2. When is this surgery scheduled? 03-01-19  3. What type of clearance is required (medical clearance vs. Pharmacy clearance to hold med vs. Both)? both  4. Are there any medications that need to be held prior to surgery and how long? Patient is not sure  5. Practice name and name of physician performing surgery? Dr. Katy Fitch , [place]  6. What is your office phone number: [phone]   7.   What is your office fax number: [phone]  8.   Anesthesia type (None, local, MAC, general) ? Patient does not know  Patient called and wanted to make Dr. Tamala Julian aware that she has surgery scheduled for her Cataracts on 03-01-19. If Dr. Tamala Julian feels she is not cleared for this procedure, please let her know.      Johnna Acosta 02/07/2019, 10:18 AM  _________________________________________________________________   (provider comments below)

## 2019-02-07 NOTE — Telephone Encounter (Signed)
   Primary Cardiologist: Sinclair Grooms, MD  Chart reviewed as part of pre-operative protocol coverage. Cataract extractions are recognized in guidelines as low risk surgeries that do not typically require specific preoperative testing or holding of blood thinner therapy. Therefore, given past medical history and time since last visit, based on ACC/AHA guidelines, [name] would be at acceptable risk for the planned procedure without further cardiovascular testing.   I will route this recommendation to the requesting party via Epic fax function and remove from pre-op pool.  Please call with questions.  Kathyrn Drown, NP 02/07/2019, 10:41 AM

## 2019-02-13 ENCOUNTER — Other Ambulatory Visit: Payer: Self-pay | Admitting: Internal Medicine

## 2019-02-13 ENCOUNTER — Other Ambulatory Visit: Payer: Medicaid Other

## 2019-02-13 DIAGNOSIS — I10 Essential (primary) hypertension: Secondary | ICD-10-CM

## 2019-02-13 DIAGNOSIS — I5042 Chronic combined systolic (congestive) and diastolic (congestive) heart failure: Secondary | ICD-10-CM

## 2019-02-13 MED FILL — ISOSORBIDE MN ER 120 MG TAB: 120 | 30 days supply | Qty: 30 | Fill #2

## 2019-02-13 NOTE — Telephone Encounter (Signed)
Faxed clearance to the requesting office of [employer] via [employer].

## 2019-02-13 NOTE — Telephone Encounter (Signed)
Follow up     Pt states that she has not heard from our office regarding clearing her for cataract surgery.  Please call and let her know if she can have the surgery.

## 2019-02-13 NOTE — Telephone Encounter (Signed)
Called patient Mrs. [name] and informed her that she has been cleared for her upcoming procedure and the clearance was also faxed to the office of Dr. Katy Fitch. Patient thanked me for calling her.

## 2019-02-14 MED FILL — JARDIANCE 10 MG TABLET: 10 | 30 days supply | Qty: 30 | Fill #4

## 2019-02-18 MED FILL — LOSARTAN POTASSIUM 100 MG T: 100 | 30 days supply | Qty: 30 | Fill #0

## 2019-02-20 ENCOUNTER — Other Ambulatory Visit: Payer: Medicaid Other

## 2019-02-22 MED FILL — METFORMIN HCL ER 500 MG TB2: 500 | 30 days supply | Qty: 60 | Fill #3

## 2019-02-25 MED FILL — VICTOZA 18 MG/3 ML INJECT P: 18 | 30 days supply | Qty: 9 | Fill #4

## 2019-02-26 ENCOUNTER — Encounter: Payer: Self-pay | Admitting: *Deleted

## 2019-03-01 DIAGNOSIS — H25811 Combined forms of age-related cataract, right eye: Secondary | ICD-10-CM | POA: Diagnosis not present

## 2019-03-06 MED FILL — OMEPRAZOLE 20 MG CAPSULE DR: 20 | 30 days supply | Qty: 30 | Fill #1

## 2019-03-12 ENCOUNTER — Telehealth: Payer: Self-pay | Admitting: *Deleted

## 2019-03-12 ENCOUNTER — Other Ambulatory Visit: Payer: Self-pay

## 2019-03-12 ENCOUNTER — Ambulatory Visit (HOSPITAL_COMMUNITY)
Admission: RE | Admit: 2019-03-12 | Discharge: 2019-03-12 | Disposition: A | Discharge status: Home / Self Care | Payer: Medicaid Other | Source: Ambulatory Visit | Attending: Internal Medicine | Admitting: Internal Medicine

## 2019-03-12 ENCOUNTER — Encounter: Payer: Self-pay | Admitting: Internal Medicine

## 2019-03-12 ENCOUNTER — Ambulatory Visit: Payer: Medicaid Other | Admitting: Internal Medicine

## 2019-03-12 VITALS — BP 178/81 | HR 81 | Temp 97.9°F | Ht 63.0 in | Wt 174.0 lb

## 2019-03-12 DIAGNOSIS — I252 Old myocardial infarction: Secondary | ICD-10-CM | POA: Diagnosis not present

## 2019-03-12 DIAGNOSIS — R0602 Shortness of breath: Secondary | ICD-10-CM | POA: Diagnosis not present

## 2019-03-12 DIAGNOSIS — I110 Hypertensive heart disease with heart failure: Secondary | ICD-10-CM | POA: Diagnosis not present

## 2019-03-12 DIAGNOSIS — I509 Heart failure, unspecified: Secondary | ICD-10-CM | POA: Diagnosis not present

## 2019-03-12 DIAGNOSIS — J449 Chronic obstructive pulmonary disease, unspecified: Secondary | ICD-10-CM | POA: Diagnosis not present

## 2019-03-12 DIAGNOSIS — Z9114 Patient's other noncompliance with medication regimen: Secondary | ICD-10-CM | POA: Diagnosis not present

## 2019-03-12 HISTORY — DX: Shortness of breath: R06.02

## 2019-03-12 LAB — CBC
HCT: 34.2 % — ABNORMAL LOW (ref 36.0–46.0)
Hemoglobin: 10.6 g/dL — ABNORMAL LOW (ref 12.0–15.0)
MCH: 29.1 pg (ref 26.0–34.0)
MCHC: 31 g/dL (ref 30.0–36.0)
MCV: 94 fL (ref 80.0–100.0)
Platelets: 208 10*3/uL (ref 150–400)
RBC: 3.64 MIL/uL — ABNORMAL LOW (ref 3.87–5.11)
RDW: 16 % — ABNORMAL HIGH (ref 11.5–15.5)
WBC: 5.5 10*3/uL (ref 4.0–10.5)
nRBC: 0 % (ref 0.0–0.2)

## 2019-03-12 LAB — BASIC METABOLIC PANEL
Anion gap: 10 (ref 5–15)
BUN: 26 mg/dL — ABNORMAL HIGH (ref 8–23)
CO2: 21 mmol/L — ABNORMAL LOW (ref 22–32)
Calcium: 8.9 mg/dL (ref 8.9–10.3)
Chloride: 110 mmol/L (ref 98–111)
Creatinine, Ser: 1.77 mg/dL — ABNORMAL HIGH (ref 0.44–1.00)
GFR calc Af Amer: 35 mL/min — ABNORMAL LOW (ref >60)
GFR calc non Af Amer: 30 mL/min — ABNORMAL LOW (ref >60)
Glucose, Bld: 128 mg/dL — ABNORMAL HIGH (ref 70–99)
Potassium: 4 mmol/L (ref 3.5–5.1)
Sodium: 141 mmol/L (ref 135–145)

## 2019-03-12 LAB — BRAIN NATRIURETIC PEPTIDE: B Natriuretic Peptide: 590 pg/mL — ABNORMAL HIGH (ref 0.0–100.0)

## 2019-03-12 LAB — TROPONIN I (HIGH SENSITIVITY): Troponin I (High Sensitivity): 22 ng/L — ABNORMAL HIGH (ref <18)

## 2019-03-12 MED ORDER — ALBUTEROL SULFATE HFA 108 (90 BASE) MCG/ACT IN AERS: 2.0000 | INHALATION_SPRAY | Freq: Four times a day (QID) | RESPIRATORY_TRACT | 0 refills | Status: DC | PRN

## 2019-03-12 MED FILL — ALBUTEROL SULFATE HFA 108 (: 108 (90 BAS | 25 days supply | Qty: 9 | Fill #0

## 2019-03-12 NOTE — Progress Notes (Signed)
   CC: Shortness of Breath  HPI:  [name] is a 63 y.o. female, with a PMHx of MI, COPD, HTN, and CHF, who presents to the clinic with [facility]. To see the management of her acute and chronic conditions, please see the A&P notes under the Encounter tab.   Past Medical History:  Diagnosis Date  . Arthritis    "hands, shoulders, hips, legs" (05/10/2018)  . Arthritis of left hip 08/18/2013   S/p total hip arthroplasty.    . CHF (congestive heart failure) ([place])    2014...@ [facility]  . Chronic lower back pain   . COPD (chronic obstructive pulmonary disease) ([place])   . Healthcare maintenance 06/16/2015  . High cholesterol   . Hypertension   . Influenza A 06/01/2017  . Influenza, pneumonia 06/01/2017  . Left flank pain 10/11/2016  . Myocardial infarction [facility])    "been told that I've had one; don't know when it would have been" (05/10/2018)  . Problem with sexual relationship 10/13/2017  . PVD (peripheral vascular disease) ([place])   . Type II diabetes mellitus ([place])    diagnosed 2001   Review of Systems:   Review of Systems  Constitutional: Negative for chills, fever and weight loss.       Pateint states that she has gained roughly 10 pounds with no change in diet.   HENT: Negative for ear discharge, ear pain, hearing loss and tinnitus.   Eyes: Negative for blurred vision, double vision, photophobia and pain.  Cardiovascular: Positive for leg swelling. Negative for chest pain, palpitations and orthopnea.       Leg swelling that dissipates overnight.  Gastrointestinal: Negative for abdominal pain, constipation, diarrhea, heartburn, nausea and vomiting.  Musculoskeletal: Negative for myalgias.  Neurological: Negative for dizziness and headaches.     Physical Exam: Physical Exam Constitutional:      General: She is not in acute distress.    Appearance: She is well-developed. She is not ill-appearing or toxic-appearing.  HENT:     Head: Normocephalic and atraumatic.  Cardiovascular:   Rate and Rhythm: Normal rate and regular rhythm.     Heart sounds: No murmur. No friction rub. No gallop.   Pulmonary:     Effort: Pulmonary effort is normal.     Breath sounds: No decreased breath sounds or wheezing.     Comments: Slight crackles hear at the lung bases bilaterally.  Abdominal:     General: Bowel sounds are normal.     Palpations: Abdomen is soft.  Neurological:     General: No focal deficit present.     Mental Status: She is alert and oriented to person, place, and time.  Psychiatric:        Mood and Affect: Mood normal.        Behavior: Behavior normal.    Vitals:   03/12/19 1000  BP: (!) 178/81  Pulse: 81  SpO2: 99%  Weight: 174 lb (78.9 kg)  Height: 5\' 3"  (1.6 m)     Assessment & Plan:   See Encounters Tab for problem based charting.  Patient seen with Dr. Rebeca Alert

## 2019-03-12 NOTE — Telephone Encounter (Signed)
Call from pt - c/o shortness of breath for "last few days". "Hard to breathe" Hx of CHF. SOB noticed when pt talking on the telephone. Denies fever or any pain. [facility] appt scheduled for this am @ 0945 AM.

## 2019-03-12 NOTE — Patient Instructions (Addendum)
[name],   It was a pleasure meeting you today! Today, we took some blood work and imaging of your heart to see how it is doing. Our labs came back normal, and we would like for you to take the Lasix 40mg  two times a day. One in the morning, and the second after lunch. Also, please continue to weigh yourself every day. Call our clinic on Friday to give them the results, and to see if your shortness of breath has improved. We look forward to hearing from you. Sincerely,  Maudie Mercury.

## 2019-03-12 NOTE — Assessment & Plan Note (Addendum)
H: Patient arrived to the clinic with shortness of breath. SHOB started a month ago, and has progressively gotten worse. Patient states that her shortness of breath is aggravated when she has to talk for long courses of time, and walking. She states that she is able to due her ADL, but is more tired than usual. She states that her albuterol inhaler is helpful and provides 5-10 minutes of relief after an episode of SHOB. She states that she has noticed an increase in her weight, and swelling in her feet. She denies orthopnea, but states that she is uncomfortable laying down flat.   A:  Patient does have a PMH of MI and CHF, and has not taken her furosemide for 4-6 months. There were faint crackles auscultated during her physical exam. A CBC, BMP, BNP, Trop, EKG, and CXR were ordered. Dr. Rebeca Alert and myself personally reviewed all of her lab work and imaging, and found that her CXR and EKG were stable, as were her labs. She does not express symptoms of URI, and this is likely secondary to her CHF.  P:  - Restart Lasix 40mg  BID - Patient is to weigh themselves at home every day an keep a log.  - Patient is to call the clinic on Friday to report weight loss and if her symptoms have improved or declined.

## 2019-03-12 NOTE — Telephone Encounter (Signed)
Thank you, agree she should be seen today, we will evaluate further when she comes in

## 2019-03-14 ENCOUNTER — Telehealth: Payer: Self-pay | Admitting: Internal Medicine

## 2019-03-14 MED FILL — ISOSORBIDE MN ER 120 MG TAB: 120 | 30 days supply | Qty: 30 | Fill #3

## 2019-03-14 MED FILL — ATORVASTATIN 40 MG TABLET: 40 | 90 days supply | Qty: 90 | Fill #1

## 2019-03-14 NOTE — Telephone Encounter (Signed)
Will defer to Dr. Gilford Rile and Dr. Rebeca Alert as I was not able to see her in clinic recently.

## 2019-03-14 NOTE — Telephone Encounter (Signed)
Pt calls and states her weight is down to 160.8 this am. She states she feels better, easier to breathe, not as tight in abdomen. Not as worried.should she continue increased dose of lasix? Reminded her of foods rich in K+ and to drink sufficient amount of water Sending to dr's seawell and winters

## 2019-03-14 NOTE — Telephone Encounter (Signed)
Pt is requesting a callback from a nurse [phone]

## 2019-03-15 ENCOUNTER — Ambulatory Visit: Payer: Medicaid Other

## 2019-03-19 NOTE — Telephone Encounter (Signed)
Relayed info from Dr. Gilford Rile below to patient. States she started taking 1 tab of furosemide daily on 03/14/2019. States she will continue taking 1 tab of furosemide daily and will still weigh herself daily. No other questions or concerns at this time. Hubbard Hartshorn, BSN, RN-BC

## 2019-03-19 NOTE — Telephone Encounter (Signed)
Maudie Mercury, MD  Imp Triage Nurse Pool; Gaylyn Rong, RN; Sharon Seller, Jaimie A, DO 5 days ago   To all,  I apologize for the delayed response and I hope you are well. I spoke with Dr. Rebeca Alert, and we have two options. If the patient is willing to weigh herself daily, she can stop the medication and take it if her weight increases above 163 pounds. If the patient does not want to weigh herself, she can take one tablet of her furosemide a day. Thank you for reaching out and I hope you have a pleasant evening.  Sincerely,  Maudie Mercury MD

## 2019-03-19 NOTE — Progress Notes (Signed)
Cardiology Office Note:    Date:  03/20/2019   ID:  EVERETTE DIMAURO, DOB 04-May-1955, MRN [id number]  PCP:  Marty Heck, DO  Cardiologist:  Sinclair Grooms, MD   Referring MD: Marty Heck, DO   Chief Complaint  Patient presents with  . Congestive Heart Failure    History of Present Illness:    [name] is a 63 y.o. female with a hx of HTN,uncontrolledDM, PVD, chronic diastolic heart failure, prior history of low EF in 2015 of 30-35%, improved EF in 2018, and history of abnormal NST but nopriorhx of cardiac cath. She was admitted 06/01/17-06/05/17 for influenza A. Echo that admission showed reduced LVEF of 25-30%.  [employer] states that she was doing fairly well until her medications were changed earlier this year.  In reviewing records, heart failure therapy was significantly altered after Jardiance was started.  The patient had an episode that appeared to represent dehydration.  Furosemide was changed to as needed.  She now has orthopnea.  She will use furosemide 3 times per week when her breathing gets to a point that it impacts her functional ability.  I believe that adding Jardiance led to increased diuresis and would necessarily lead to decrease in furosemide requirement.  It is clear that she cannot go without daily diuretic therapy unless we want her to have shortness of breath that impairs physical activity.  Past Medical History:  Diagnosis Date  . Arthritis    "hands, shoulders, hips, legs" (05/10/2018)  . Arthritis of left hip 08/18/2013   S/p total hip arthroplasty.    . CHF (congestive heart failure) ([place])    2014...@ [facility]  . Chronic lower back pain   . COPD (chronic obstructive pulmonary disease) ([place])   . Healthcare maintenance 06/16/2015  . High cholesterol   . Hypertension   . Influenza A 06/01/2017  . Influenza, pneumonia 06/01/2017  . Left flank pain 10/11/2016  . Myocardial infarction [facility])    "been told that I've had one; don't know when  it would have been" (05/10/2018)  . Problem with sexual relationship 10/13/2017  . PVD (peripheral vascular disease) ([place])   . Type II diabetes mellitus ([place])    diagnosed 2001    Past Surgical History:  Procedure Laterality Date  . ABDOMINAL ANGIOGRAM  12/28/2012  . ABDOMINAL ANGIOGRAM N/A 12/29/2011   Procedure: ABDOMINAL ANGIOGRAM;  Surgeon: Conrad Loveland, MD;  Location: [facility] CATH LAB;  Service: Cardiovascular;  Laterality: N/A;  . ABDOMINAL AORTAGRAM N/A 12/21/2012   Procedure: ABDOMINAL Maxcine Ham;  Surgeon: Elam Dutch, MD;  Location: [facility] CATH LAB;  Service: Cardiovascular;  Laterality: N/A;  . EYE SURGERY Bilateral ~ 2018   "laser; related to my diabetes"  . JOINT REPLACEMENT    . PERIPHERAL VASCULAR CATHETERIZATION     "had to have some veins unclogged cause I was in pain when I walked" (05/10/2018)  . RIGHT/LEFT HEART CATH AND CORONARY ANGIOGRAPHY N/A 04/30/2018   Procedure: RIGHT/LEFT HEART CATH AND CORONARY ANGIOGRAPHY;  Surgeon: [name], MD;  Location: [place] CV LAB;  Service: Cardiovascular;  Laterality: N/A;  . TOTAL HIP ARTHROPLASTY Left 08/19/2013   Procedure: TOTAL HIP ARTHROPLASTY;  Surgeon: Kerin Salen, MD;  Location: [place];  Service: Orthopedics;  Laterality: Left;  . TUBAL LIGATION      Current Medications: Current Meds  Medication Sig  . Accu-Chek FastClix Lancets MISC Check blood sugar up to  3x/day before meals  . albuterol (  PROVENTIL HFA) 108 (90 Base) MCG/ACT inhaler Inhale 2 puffs into the lungs every 6 (six) hours as needed for wheezing or shortness of breath.  Marland Kitchen aspirin EC 81 MG tablet Take 1 tablet (81 mg total) by mouth daily. IM program, [facility]  . atorvastatin (LIPITOR) 40 MG tablet Take 1 tablet (40 mg total) by mouth daily.  . blood glucose meter kit and supplies KIT Dispense based on patient and insurance preference. Use up to four times daily as directed. (FOR ICD-9 250.00, 250.01).  . Blood Glucose Monitoring Suppl (ACCU-CHEK GUIDE)  w/Device KIT 1 each by Does not apply route 3 (three) times daily.  . carvedilol (COREG) 25 MG tablet Take 0.5 tablets (12.5 mg total) by mouth 2 (two) times daily.  . Cholecalciferol (VITAMIN D3) 125 MCG (5000 UT) CAPS Take 5,000 Units by mouth daily.  . diclofenac sodium (VOLTAREN) 1 % GEL Apply 2 g topically 4 (four) times daily.  . empagliflozin (JARDIANCE) 10 MG TABS tablet Take 10 mg by mouth daily.  Marland Kitchen glucose blood (ACCU-CHEK GUIDE) test strip Check blood sugar up to 3x/day before meals  . isosorbide mononitrate (IMDUR) 120 MG 24 hr tablet Take 1 tablet (120 mg total) by mouth daily.  Marland Kitchen liraglutide (VICTOZA) 18 MG/3ML SOPN Inject 1.2 mg into the skin daily.  Marland Kitchen losartan (COZAAR) 100 MG tablet TAKE 1 TABLET BY MOUTH DAILY.  . metFORMIN (GLUCOPHAGE-XR) 500 MG 24 hr tablet Take 1 tablet (500 mg total) by mouth 2 (two) times daily with a meal.  . Multiple Vitamins-Minerals (MULTIVITAMIN GUMMIES ADULT) CHEW Chew 2 tablets by mouth daily.  . nitroGLYCERIN (NITROSTAT) 0.4 MG SL tablet Place 1 tablet (0.4 mg total) under the tongue every 5 (five) minutes as needed for chest pain.  Marland Kitchen omeprazole (PRILOSEC) 20 MG capsule TAKE 1 CAPSULE (20 MG TOTAL) BY MOUTH DAILY.  . [DISCONTINUED] furosemide (LASIX) 40 MG tablet Take 40 mg by mouth daily.     Allergies:   Lisinopril and Plavix [clopidogrel bisulfate]   Social History   Socioeconomic History  . Marital status: Married    Spouse name: Not on file  . Number of children: Not on file  . Years of education: 12,college  . Highest education level: Not on file  Occupational History  . Not on file  Social Needs  . Financial resource strain: Not on file  . Food insecurity    Worry: Not on file    Inability: Not on file  . Transportation needs    Medical: Not on file    Non-medical: Not on file  Tobacco Use  . Smoking status: Former Smoker    Years: 7.00    Types: Cigarettes    Quit date: 05/08/1987    Years since quitting: 31.8  . Smokeless  tobacco: Never Used  . Tobacco comment: 05/10/2018 "smoked when I drank"  Substance and Sexual Activity  . Alcohol use: Yes    Alcohol/week: 0.0 standard drinks    Comment: 05/10/2018 "glass of wine on holidays"  . Drug use: Not Currently  . Sexual activity: Not Currently  Lifestyle  . Physical activity    Days per week: Not on file    Minutes per session: Not on file  . Stress: Not on file  Relationships  . Social Herbalist on phone: Not on file    Gets together: Not on file    Attends religious service: Not on file    Active member of club or organization:  Not on file    Attends meetings of clubs or organizations: Not on file    Relationship status: Not on file  Other Topics Concern  . Not on file  Social History Narrative  . Not on file     Family History: The patient's family history includes Diabetes in her mother.  ROS:   Please see the history of present illness.    She is not having chest pain.  She has 2 pillow orthopnea.  No peripheral edema.  All other systems reviewed and are negative.  EKGs/Labs/Other Studies Reviewed:    The following studies were reviewed today: 2D Doppler echocardiogram May 2020: IMPRESSIONS    1. The left ventricle has normal systolic function, with an ejection fraction of 55-60%. The cavity size was normal. There is severely increased left ventricular wall thickness. Left ventricular diastolic Doppler parameters are consistent with impaired  relaxation.  2. The right ventricle has normal systolic function. The cavity was normal.  3. Left atrial size was moderately dilated.  4. Trivial pericardial effusion is present.  5. The mitral valve is abnormal. Mild thickening of the mitral valve leaflet. There is moderate mitral annular calcification present.  6. The tricuspid valve is grossly normal.  7. The aortic valve is tricuspid. Mild calcification of the aortic valve. Aortic valve regurgitation is mild by color flow Doppler. No  stenosis of the aortic valve.  8. Normal LV systolic function; severe LVH; mild diastolic dysfunction; moderate LAE; mild AI and MR. Myocardium with speckled appearance; consider amyloid.  EKG:  EKG a new tracing is not repeated today.  Recent Labs: 05/10/2018: NT-Pro BNP 2,478 11/14/2018: ALT 23 03/12/2019: B Natriuretic Peptide 590.0; BUN 26; Creatinine, Ser 1.77; Hemoglobin 10.6; Platelets 208; Potassium 4.0; Sodium 141  Recent Lipid Panel    Component Value Date/Time   CHOL 149 08/31/2017 0941   TRIG 74 08/31/2017 0941   HDL 60 08/31/2017 0941   CHOLHDL 2.5 08/31/2017 0941   CHOLHDL 3.7 11/08/2012 1331   VLDL 19 11/08/2012 1331   LDLCALC 74 08/31/2017 0941    Physical Exam:    VS:  BP (!) 172/74   Pulse 85   Ht '5\' 3"'  (1.6 m)   Wt 167 lb 12.8 oz (76.1 kg)   SpO2 97%   BMI 29.72 kg/m     Wt Readings from Last 3 Encounters:  03/20/19 167 lb 12.8 oz (76.1 kg)  03/12/19 174 lb (78.9 kg)  12/19/18 163 lb 11.2 oz (74.3 kg)     GEN: Appears older than stated age. No acute distress HEENT: Normal NECK: No JVD. LYMPHATICS: No lymphadenopathy CARDIAC:  RRR without murmur, gallop, or edema. VASCULAR:  Normal Pulses. No bruits. RESPIRATORY:  Clear to auscultation without rales, wheezing or rhonchi  ABDOMEN: Soft, non-tender, non-distended, No pulsatile mass, MUSCULOSKELETAL: No deformity  SKIN: Warm and dry NEUROLOGIC:  Alert and oriented x 3 PSYCHIATRIC:  Normal affect   ASSESSMENT:    1. Coronary artery disease involving native heart, angina presence unspecified, unspecified vessel or lesion type   2. Chronic combined systolic and diastolic CHF, NYHA class 2 ([place])   3. LBBB (left bundle branch block)   4. Essential hypertension   5. Hyperlipidemia, unspecified hyperlipidemia type   6. DM (diabetes mellitus) type II uncontrolled, periph vascular disorder (HCC)   7. Stage 3b chronic kidney disease   8. Educated about COVID-19 virus infection    PLAN:    In order of  problems listed above:  1. Secondary  prevention discussed. 2. Resume furosemide 20 mg every day.  Basic metabolic panel in 7 to 10 days.  Clinical follow-up in 1 month to reassess dyspnea.  She needs to be on diuretic therapy daily.  We will use 50% of what her prior dose had been. 3. Not assessed 4. The blood pressure is high.  Daily diuretic therapy may help. 5. LDL target less than 70.  Most recent in May 2019 was 74. 6. Should be less than 7.  Was 7.5 in January 2020 7. Stage III-IV CKD. 8. There are 3W's is understood and is being practiced.  She will always need to be on heart failure therapy.  She has chronic kidney disease which impacts therapeutic regimen.  Her blood pressure is elevated and she is short of breath.  There is no lower extremity edema but she is still mildly volume overloaded.  With the addition of Jardiance she is having better diuresis and would just require less diuretic therapy.  Rather than 40 mg as needed for dyspnea, I have asked her to take furosemide 20 mg daily with liberty to take 40 mg on days of her breathing is difficult.   Medication Adjustments/Labs and Tests Ordered: Current medicines are reviewed at length with the patient today.  Concerns regarding medicines are outlined above.  Orders Placed This Encounter  Procedures  . Basic metabolic panel   Meds ordered this encounter  Medications  . furosemide (LASIX) 20 MG tablet    Sig: Take 1 tablet (20 mg total) by mouth daily.    Dispense:  90 tablet    Refill:  3    Change in therapy    Patient Instructions  Medication Instructions:  1) TAKE Furosemide 90m once daily in the morning  *If you need a refill on your cardiac medications before your next appointment, please call your pharmacy*  Lab Work: Your physician recommends that you return for lab work in: 1 week (BMET)  If you have labs (blood work) drawn today and your tests are completely normal, you will receive your results only by: .Marland Kitchen MyChart Message (if you have MyChart) OR . A paper copy in the mail If you have any lab test that is abnormal or we need to change your treatment, we will call you to review the results.  Testing/Procedures: None  Follow-Up: At [facility] you and your health needs are our priority.  As part of our continuing mission to provide you with exceptional heart care, we have created designated Provider Care Teams.  These Care Teams include your primary Cardiologist (physician) and Advanced Practice Providers (APPs -  Physician Assistants and Nurse Practitioners) who all work together to provide you with the care you need, when you need it.  Your next appointment:   4 week(s)  The format for your next appointment:   In Person  Provider:   You may see HSinclair Grooms MD or one of the following Advanced Practice Providers on your designated Care Team:    LTruitt Merle NP  LCecilie Kicks NP  JKathyrn Drown NP   Other Instructions      Signed, HSinclair Grooms MD  03/20/2019 5:18 PM    [place]

## 2019-03-20 ENCOUNTER — Ambulatory Visit (INDEPENDENT_AMBULATORY_CARE_PROVIDER_SITE_OTHER): Payer: Medicaid Other | Admitting: Interventional Cardiology

## 2019-03-20 ENCOUNTER — Other Ambulatory Visit: Payer: Self-pay

## 2019-03-20 ENCOUNTER — Encounter: Payer: Self-pay | Admitting: Interventional Cardiology

## 2019-03-20 VITALS — BP 172/74 | HR 85 | Ht 63.0 in | Wt 167.8 lb

## 2019-03-20 DIAGNOSIS — N1832 Chronic kidney disease, stage 3b: Secondary | ICD-10-CM

## 2019-03-20 DIAGNOSIS — I5042 Chronic combined systolic (congestive) and diastolic (congestive) heart failure: Secondary | ICD-10-CM | POA: Diagnosis not present

## 2019-03-20 DIAGNOSIS — IMO0002 Reserved for concepts with insufficient information to code with codable children: Secondary | ICD-10-CM

## 2019-03-20 DIAGNOSIS — I447 Left bundle-branch block, unspecified: Secondary | ICD-10-CM

## 2019-03-20 DIAGNOSIS — E1165 Type 2 diabetes mellitus with hyperglycemia: Secondary | ICD-10-CM

## 2019-03-20 DIAGNOSIS — I2510 Atherosclerotic heart disease of native coronary artery without angina pectoris: Secondary | ICD-10-CM

## 2019-03-20 DIAGNOSIS — I10 Essential (primary) hypertension: Secondary | ICD-10-CM

## 2019-03-20 DIAGNOSIS — Z7189 Other specified counseling: Secondary | ICD-10-CM

## 2019-03-20 DIAGNOSIS — E785 Hyperlipidemia, unspecified: Secondary | ICD-10-CM

## 2019-03-20 DIAGNOSIS — E1151 Type 2 diabetes mellitus with diabetic peripheral angiopathy without gangrene: Secondary | ICD-10-CM

## 2019-03-20 DIAGNOSIS — I130 Hypertensive heart and chronic kidney disease with heart failure and stage 1 through stage 4 chronic kidney disease, or unspecified chronic kidney disease: Secondary | ICD-10-CM | POA: Diagnosis not present

## 2019-03-20 MED ORDER — FUROSEMIDE 20 MG PO TABS: 20.0000 mg | ORAL_TABLET | Freq: Every day | ORAL | 3 refills | Status: DC

## 2019-03-20 MED FILL — FUROSEMIDE 20 MG TABS: 20 | 90 days supply | Qty: 90 | Fill #0

## 2019-03-20 MED FILL — JARDIANCE 10 MG TABLET: 10 | 30 days supply | Qty: 30 | Fill #5

## 2019-03-20 MED FILL — LOSARTAN POTASSIUM 100 MG T: 100 | 30 days supply | Qty: 30 | Fill #1

## 2019-03-20 NOTE — Patient Instructions (Signed)
Medication Instructions:  1) TAKE Furosemide 20mg  once daily in the morning  *If you need a refill on your cardiac medications before your next appointment, please call your pharmacy*  Lab Work: Your physician recommends that you return for lab work in: 1 week (BMET)  If you have labs (blood work) drawn today and your tests are completely normal, you will receive your results only by: Marland Kitchen MyChart Message (if you have MyChart) OR . A paper copy in the mail If you have any lab test that is abnormal or we need to change your treatment, we will call you to review the results.  Testing/Procedures: None  Follow-Up: At [facility], you and your health needs are our priority.  As part of our continuing mission to provide you with exceptional heart care, we have created designated Provider Care Teams.  These Care Teams include your primary Cardiologist (physician) and Advanced Practice Providers (APPs -  Physician Assistants and Nurse Practitioners) who all work together to provide you with the care you need, when you need it.  Your next appointment:   4 week(s)  The format for your next appointment:   In Person  Provider:   You may see Sinclair Grooms, MD or one of the following Advanced Practice Providers on your designated Care Team:    Truitt Merle, NP  Cecilie Kicks, NP  Kathyrn Drown, NP   Other Instructions

## 2019-04-01 ENCOUNTER — Other Ambulatory Visit: Payer: Self-pay

## 2019-04-01 ENCOUNTER — Other Ambulatory Visit: Payer: Medicaid Other | Admitting: *Deleted

## 2019-04-01 DIAGNOSIS — I2510 Atherosclerotic heart disease of native coronary artery without angina pectoris: Secondary | ICD-10-CM | POA: Diagnosis not present

## 2019-04-01 DIAGNOSIS — I5042 Chronic combined systolic (congestive) and diastolic (congestive) heart failure: Secondary | ICD-10-CM

## 2019-04-01 LAB — BASIC METABOLIC PANEL
BUN/Creatinine Ratio: 17 (ref 12–28)
BUN: 33 mg/dL — ABNORMAL HIGH (ref 8–27)
CO2: 22 mmol/L (ref 20–29)
Calcium: 9.3 mg/dL (ref 8.7–10.3)
Chloride: 106 mmol/L (ref 96–106)
Creatinine, Ser: 1.93 mg/dL — ABNORMAL HIGH (ref 0.57–1.00)
GFR calc Af Amer: 32 mL/min/{1.73_m2} — ABNORMAL LOW (ref >59)
GFR calc non Af Amer: 27 mL/min/{1.73_m2} — ABNORMAL LOW (ref >59)
Glucose: 156 mg/dL — ABNORMAL HIGH (ref 65–99)
Potassium: 4.4 mmol/L (ref 3.5–5.2)
Sodium: 142 mmol/L (ref 134–144)

## 2019-04-01 NOTE — Progress Notes (Signed)
Internal Medicine Clinic Attending  I saw and evaluated the patient.  I personally confirmed the key portions of the history and exam documented by Dr. Gilford Rile and I reviewed pertinent patient test results.  The assessment, diagnosis, and plan were formulated together and I agree with the documentation in the resident's note.  Lenice Pressman, M.D., Ph.D.

## 2019-04-09 ENCOUNTER — Other Ambulatory Visit: Payer: Self-pay | Admitting: Internal Medicine

## 2019-04-09 DIAGNOSIS — E1165 Type 2 diabetes mellitus with hyperglycemia: Secondary | ICD-10-CM

## 2019-04-09 DIAGNOSIS — IMO0002 Reserved for concepts with insufficient information to code with codable children: Secondary | ICD-10-CM

## 2019-04-09 MED ORDER — LIRAGLUTIDE 18 MG/3ML ~~LOC~~ SOPN: 1.2000 mg | PEN_INJECTOR | Freq: Every day | SUBCUTANEOUS | 0 refills | Status: DC

## 2019-04-09 MED FILL — VICTOZA 18 MG/3 ML INJECT P: 18 | 30 days supply | Qty: 9 | Fill #5

## 2019-04-10 DIAGNOSIS — H2512 Age-related nuclear cataract, left eye: Secondary | ICD-10-CM | POA: Diagnosis not present

## 2019-04-12 DIAGNOSIS — H25812 Combined forms of age-related cataract, left eye: Secondary | ICD-10-CM | POA: Diagnosis not present

## 2019-04-17 MED FILL — UNIFINE PENTIPS 31GX3/16": 31G X 5 MM | 90 days supply | Qty: 100 | Fill #0

## 2019-04-17 MED FILL — ISOSORBIDE MN ER 120 MG TAB: 120 | 30 days supply | Qty: 30 | Fill #4

## 2019-04-17 MED FILL — UNIFINE PENTIPS 31GX3/16: 31G X 5 MM | 90 days supply | Qty: 100 | Fill #0

## 2019-04-17 MED FILL — JARDIANCE 10 MG TABLET: 10 | 30 days supply | Qty: 30 | Fill #6

## 2019-04-17 MED FILL — LOSARTAN POTASSIUM 100 MG T: 100 | 30 days supply | Qty: 30 | Fill #2

## 2019-04-23 ENCOUNTER — Other Ambulatory Visit: Payer: Self-pay

## 2019-04-23 ENCOUNTER — Encounter: Payer: Self-pay | Admitting: Cardiology

## 2019-04-23 ENCOUNTER — Ambulatory Visit (INDEPENDENT_AMBULATORY_CARE_PROVIDER_SITE_OTHER): Payer: Medicaid Other | Admitting: Cardiology

## 2019-04-23 VITALS — BP 142/70 | HR 77 | Ht 63.0 in | Wt 170.0 lb

## 2019-04-23 DIAGNOSIS — I2510 Atherosclerotic heart disease of native coronary artery without angina pectoris: Secondary | ICD-10-CM

## 2019-04-23 DIAGNOSIS — I447 Left bundle-branch block, unspecified: Secondary | ICD-10-CM | POA: Diagnosis not present

## 2019-04-23 DIAGNOSIS — IMO0002 Reserved for concepts with insufficient information to code with codable children: Secondary | ICD-10-CM

## 2019-04-23 DIAGNOSIS — I10 Essential (primary) hypertension: Secondary | ICD-10-CM

## 2019-04-23 DIAGNOSIS — E1151 Type 2 diabetes mellitus with diabetic peripheral angiopathy without gangrene: Secondary | ICD-10-CM

## 2019-04-23 DIAGNOSIS — E1165 Type 2 diabetes mellitus with hyperglycemia: Secondary | ICD-10-CM

## 2019-04-23 DIAGNOSIS — I5042 Chronic combined systolic (congestive) and diastolic (congestive) heart failure: Secondary | ICD-10-CM | POA: Diagnosis not present

## 2019-04-23 NOTE — Progress Notes (Signed)
Cardiology Office Note   Date:  04/24/2019   ID:  [name], DOB 1955/07/06, MRN [id number]  PCP:  Marty Heck, DO  Cardiologist:  Dr. Tamala Julian     Chief Complaint  Patient presents with  . Congestive Heart Failure      History of Present Illness: [name] is a 63 y.o. female who presents for volume overload.   hx of HTN,uncontrolledDM, PVD, chronic diastolic heart failure, prior history of low EF in 2015 of 30-35%, improved EF in 2018, and history of abnormal NST but nopriorhx of cardiac cath. She was admitted 06/01/17-06/05/17 for influenza A. Echo that admission showed reduced LVEF of 25-30%.  [name], on last visit with Dr. Tamala Julian 03/20/19,  stated that she was doing fairly well until her medications were changed earlier this year.  In reviewing records, heart failure therapy was significantly altered after Jardiance was started.  The patient had an episode that appeared to represent dehydration.  Furosemide was changed to as needed.  She now has orthopnea.  She will use furosemide 3 times per week when her breathing gets to a point that it impacts her functional ability.  I believe that adding Jardiance led to increased diuresis and would necessarily lead to decrease in furosemide requirement.  It is clear that she cannot go without daily diuretic therapy unless we want her to have shortness of breath that impairs physical activity.  Pt placed back on daily lasix and here for follow up.  Rather than 40 mg as needed for dyspnea, I have asked her to take furosemide 20 mg daily with liberty to take 40 mg on days of her breathing is difficult.  Today she is feeling well.  No angina and no SOB overall much improved.  She is also asking if she could have Rt total hip.     Past Medical History:  Diagnosis Date  . Arthritis    "hands, shoulders, hips, legs" (05/10/2018)  . Arthritis of left hip 08/18/2013   S/p total hip arthroplasty.    . CHF (congestive heart  failure) ([place])    2014...@ [facility]  . Chronic lower back pain   . COPD (chronic obstructive pulmonary disease) ([place])   . Healthcare maintenance 06/16/2015  . High cholesterol   . Hypertension   . Influenza A 06/01/2017  . Influenza, pneumonia 06/01/2017  . Left flank pain 10/11/2016  . Myocardial infarction [facility])    "been told that I've had one; don't know when it would have been" (05/10/2018)  . Problem with sexual relationship 10/13/2017  . PVD (peripheral vascular disease) ([place])   . Type II diabetes mellitus ([place])    diagnosed 2001    Past Surgical History:  Procedure Laterality Date  . ABDOMINAL ANGIOGRAM  12/28/2012  . ABDOMINAL ANGIOGRAM N/A 12/29/2011   Procedure: ABDOMINAL ANGIOGRAM;  Surgeon: Conrad Eagan, MD;  Location[facility] C[facility] LAB;  Service: Cardiovascular;  Laterality: N/A;  . ABDOMINAL AORTAGRAM N/A 12/21/2012   Procedure: ABDOMINAL Maxcine Ham;  Surgeon: Elam Dutch, MD;  Location: [facility] CATH LAB;  Service: Cardiovascular;  Laterality: N/A;  . EYE SURGERY Bilateral ~ 2018   "laser; related to my diabetes"  . JOINT REPLACEMENT    . PERIPHERAL VASCULAR CATHETERIZATION     "had to have some veins unclogged cause I was in pain when I walked" (05/10/2018)  . RIGHT/LEFT HEART CATH AND CORONARY ANGIOGRAPHY N/A 04/30/2018   Procedure: RIGHT/LEFT HEART CATH AND CORONARY ANGIOGRAPHY;  Surgeon: Daneen Schick  W, MD;  Location: [place] CV LAB;  Service: Cardiovascular;  Laterality: N/A;  . TOTAL HIP ARTHROPLASTY Left 08/19/2013   Procedure: TOTAL HIP ARTHROPLASTY;  Surgeon: Kerin Salen, MD;  Location: [place];  Service: Orthopedics;  Laterality: Left;  . TUBAL LIGATION       Current Outpatient Medications  Medication Sig Dispense Refill  . Accu-Chek FastClix Lancets MISC Check blood sugar up to  3x/day before meals 102 each 12  . albuterol (PROVENTIL HFA) 108 (90 Base) MCG/ACT inhaler Inhale 2 puffs into the lungs every 6 (six) hours as needed for wheezing or shortness of breath. 18 g 0   . aspirin EC 81 MG tablet Take 1 tablet (81 mg total) by mouth daily. IM program, hope fund 30 tablet 11  . atorvastatin (LIPITOR) 40 MG tablet Take 1 tablet (40 mg total) by mouth daily. 90 tablet 3  . blood glucose meter kit and supplies KIT Dispense based on patient and insurance preference. Use up to four times daily as directed. (FOR ICD-9 250.00, 250.01). 1 each 0  . Blood Glucose Monitoring Suppl (ACCU-CHEK GUIDE) w/Device KIT 1 each by Does not apply route 3 (three) times daily. 1 kit 0  . carvedilol (COREG) 25 MG tablet Take 0.5 tablets (12.5 mg total) by mouth 2 (two) times daily. 180 tablet 1  . Cholecalciferol (VITAMIN D3) 125 MCG (5000 UT) CAPS Take 5,000 Units by mouth daily.    . diclofenac sodium (VOLTAREN) 1 % GEL Apply 2 g topically 4 (four) times daily. 100 g 3  . empagliflozin (JARDIANCE) 10 MG TABS tablet Take 10 mg by mouth daily. 30 tablet 11  . furosemide (LASIX) 20 MG tablet Take 1 tablet (20 mg total) by mouth daily. 90 tablet 3  . glucose blood (ACCU-CHEK GUIDE) test strip Check blood sugar up to 3x/day before meals 100 each 12  . isosorbide mononitrate (IMDUR) 120 MG 24 hr tablet Take 1 tablet (120 mg total) by mouth daily. 90 tablet 1  . liraglutide (VICTOZA) 18 MG/3ML SOPN Inject 0.2 mLs (1.2 mg total) into the skin daily. 6 pen 0  . losartan (COZAAR) 100 MG tablet TAKE 1 TABLET BY MOUTH DAILY. 30 tablet 3  . metFORMIN (GLUCOPHAGE-XR) 500 MG 24 hr tablet Take 1 tablet (500 mg total) by mouth 2 (two) times daily with a meal. 60 tablet 11  . Multiple Vitamins-Minerals (MULTIVITAMIN GUMMIES ADULT) CHEW Chew 2 tablets by mouth daily.    Marland Kitchen omeprazole (PRILOSEC) 20 MG capsule TAKE 1 CAPSULE (20 MG TOTAL) BY MOUTH DAILY. 90 capsule 1  . UNIFINE PENTIPS 31G X 5 MM MISC USE TO INJECT 20 UNITS DAILY 100 each 1  . nitroGLYCERIN (NITROSTAT) 0.4 MG SL tablet Place 1 tablet (0.4 mg total) under the tongue every 5 (five) minutes as needed for chest pain. 75 tablet 0   No current  facility-administered medications for this visit.    Allergies:   Lisinopril and Plavix [clopidogrel bisulfate]    Social History:  The patient  reports that she quit smoking about 31 years ago. Her smoking use included cigarettes. She quit after 7.00 years of use. She has never used smokeless tobacco. She reports current alcohol use. She reports previous drug use.   Family History:  The patient's family history includes Diabetes in her mother.    ROS:  General:no colds or fevers, no weight changes Skin:no rashes or ulcers HEENT:no blurred vision, no congestion CV:see HPI PUL:see HPI GI:no diarrhea constipation or melena,  no indigestion GU:no hematuria, no dysuria Alyssa:no joint pain, no claudication Neuro:no syncope, no lightheadedness Endo:+ diabetes, no thyroid disease  Wt Readings from Last 3 Encounters:  04/23/19 170 lb (77.1 kg)  03/20/19 167 lb 12.8 oz (76.1 kg)  03/12/19 174 lb (78.9 kg)     PHYSICAL EXAM: VS:  BP (!) 142/70   Pulse 77   Ht '5\' 3"'  (1.6 m)   Wt 170 lb (77.1 kg)   SpO2 98%   BMI 30.11 kg/m  , BMI Body mass index is 30.11 kg/m. General:Pleasant affect, NAD Skin:Warm and dry, brisk capillary refill HEENT:normocephalic, sclera clear, mucus membranes moist Neck:supple, no JVD, no bruits  Heart:S1S2 RRR without murmur, gallup, rub or click Lungs:clear without rales, rhonchi, or wheezes MCN:OBSJ, non tender, + BS, do not palpate liver spleen or masses Ext:no lower ext edema, 2+ pedal pulses, 2+ radial pulses Neuro:alert and oriented, MAE, follows commands, + facial symmetry    EKG:  EKG is NOT ordered today.    Recent Labs: 05/10/2018: NT-Pro BNP 2,478 11/14/2018: ALT 23 03/12/2019: B Natriuretic Peptide 590.0; Hemoglobin 10.6; Platelets 208 04/23/2019: BUN 37; Creatinine, Ser 1.67; Potassium 5.2; Sodium 144    Lipid Panel    Component Value Date/Time   CHOL 149 08/31/2017 0941   TRIG 74 08/31/2017 0941   HDL 60 08/31/2017 0941   CHOLHDL 2.5  08/31/2017 0941   CHOLHDL 3.7 11/08/2012 1331   VLDL 19 11/08/2012 1331   LDLCALC 74 08/31/2017 0941       Other studies Reviewed: Additional studies/ records that were reviewed today include: . Cardiac cath 05/01/19  Severe diffuse involvement of the circumflex with 70 to 80% proximal to mid stenosis, diffuse 80% and a large second obtuse marginal, and segmental 80% stenosis in the ostial to proximal first obtuse marginal/ramus.  50% ostial LAD with mid vessel eccentric 65 to 70% narrowing.  Chronic occlusion of the mid RCA fills by left to right collaterals.  Relatively normal pulmonary artery pressures and normal LV EDP.  Echo demonstrates reduced LVEF 30 to 35%.  RECOMMENDATIONS:   Guideline directed therapy for LV systolic dysfunction  Anti-ischemic therapy with long-acting nitrates  Aggressive lipid-lowering  If unable to control symptoms consider referral for surgical revascularization opinion.  Reluctant to refer the patient at this time because LAD has only moderate disease.  Circumflex territory has multiple diffusely diseased segments as is typical in diabetic patients.   ECHO 08/2018 IMPRESSIONS    1. The left ventricle has normal systolic function, with an ejection fraction of 55-60%. The cavity size was normal. There is severely increased left ventricular wall thickness. Left ventricular diastolic Doppler parameters are consistent with impaired  relaxation.  2. The right ventricle has normal systolic function. The cavity was normal.  3. Left atrial size was moderately dilated.  4. Trivial pericardial effusion is present.  5. The mitral valve is abnormal. Mild thickening of the mitral valve leaflet. There is moderate mitral annular calcification present.  6. The tricuspid valve is grossly normal.  7. The aortic valve is tricuspid. Mild calcification of the aortic valve. Aortic valve regurgitation is mild by color flow Doppler. No stenosis of the aortic  valve.  8. Normal LV systolic function; severe LVH; mild diastolic dysfunction; moderate LAE; mild AI and MR. Myocardium with speckled appearance; consider amyloid.  FINDINGS  Left Ventricle: The left ventricle has normal systolic function, with an ejection fraction of 55-60%. The cavity size was normal. There is severely increased left ventricular wall  thickness. Left ventricular diastolic Doppler parameters are consistent  with impaired relaxation.  Right Ventricle: The right ventricle has normal systolic function. The cavity was normal.  Left Atrium: Left atrial size was moderately dilated.  Right Atrium: Right atrial size was normal in size. Right atrial pressure is estimated at 3 mmHg.  Interatrial Septum: No atrial level shunt detected by color flow Doppler.  Pericardium: Trivial pericardial effusion is present.  Mitral Valve: The mitral valve is abnormal. Mild thickening of the mitral valve leaflet. There is moderate mitral annular calcification present. Mitral valve regurgitation is mild by color flow Doppler.  Tricuspid Valve: The tricuspid valve is grossly normal. Tricuspid valve regurgitation is mild by color flow Doppler.  Aortic Valve: The aortic valve is tricuspid Mild calcification of the aortic valve. Aortic valve regurgitation is mild by color flow Doppler. There is No stenosis of the aortic valve.  Pulmonic Valve: The pulmonic valve was grossly normal. Pulmonic valve regurgitation is not visualized by color flow Doppler.  Venous: The inferior vena cava is normal in size with greater than 50% respiratory variability.   ASSESSMENT AND PLAN:  1.  Chronic combined systolic and diastolic HF, NYHA class 2  Improved with addition of aldactone and decrease of lasix.  No SOB will recheck BMP  2.  CAD take NTG once every 1-2 weeks if she over exerts. Last cath 2019 treating medically on imdur 120 mg daily.   3.  Would like Rt total hip, will discuss with Dr.  Tamala Julian with chronic stable angina  4.  HTN stable.  5.  DM-2 per PCP  6.  LBBB chronic   Follow up in March with Dr. Tamala Julian  Current medicines are reviewed with the patient today.  The patient Has no concerns regarding medicines.  The following changes have been made:  See above Labs/ tests ordered today include:see above  Disposition:   FU:  see above  Signed, Cecilie Kicks, NP  04/24/2019 9:26 PM    [facility] [place], [place], [place], [place] Phone: [phone]; Fax: [phone]

## 2019-04-23 NOTE — Patient Instructions (Signed)
Medication Instructions:  Your physician recommends that you continue on your current medications as directed. Please refer to the Current Medication list given to you today.  *If you need a refill on your cardiac medications before your next appointment, please call your pharmacy*  Lab Work: TODAY:  BMET  If you have labs (blood work) drawn today and your tests are completely normal, you will receive your results only by: Marland Kitchen MyChart Message (if you have MyChart) OR . A paper copy in the mail If you have any lab test that is abnormal or we need to change your treatment, we will call you to review the results.  Testing/Procedures: None ordered  Follow-Up: At [facility], you and your health needs are our priority.  As part of our continuing mission to provide you with exceptional heart care, we have created designated Provider Care Teams.  These Care Teams include your primary Cardiologist (physician) and Advanced Practice Providers (APPs -  Physician Assistants and Nurse Practitioners) who all work together to provide you with the care you need, when you need it.  Your next appointment:   07/05/2019 ARRIVE AT 1:45 TO SEE DR. Tamala Julian  The format for your next appointment:   In Person  Provider:   Daneen Schick, MD  Other Instructions

## 2019-04-24 ENCOUNTER — Telehealth: Payer: Self-pay | Admitting: *Deleted

## 2019-04-24 DIAGNOSIS — N1832 Chronic kidney disease, stage 3b: Secondary | ICD-10-CM

## 2019-04-24 LAB — BASIC METABOLIC PANEL
BUN/Creatinine Ratio: 22 (ref 12–28)
BUN: 37 mg/dL — ABNORMAL HIGH (ref 8–27)
CO2: 17 mmol/L — ABNORMAL LOW (ref 20–29)
Calcium: 9.3 mg/dL (ref 8.7–10.3)
Chloride: 109 mmol/L — ABNORMAL HIGH (ref 96–106)
Creatinine, Ser: 1.67 mg/dL — ABNORMAL HIGH (ref 0.57–1.00)
GFR calc Af Amer: 38 mL/min/{1.73_m2} — ABNORMAL LOW (ref >59)
GFR calc non Af Amer: 33 mL/min/{1.73_m2} — ABNORMAL LOW (ref >59)
Glucose: 174 mg/dL — ABNORMAL HIGH (ref 65–99)
Potassium: 5.2 mmol/L (ref 3.5–5.2)
Sodium: 144 mmol/L (ref 134–144)

## 2019-04-24 NOTE — Telephone Encounter (Signed)
Pt has been notified of lab results and recommendations. Pt is agreeable to change spirolactone to take every other day. BMET to be done 05/08/19. Pt thanked me for the call. Patient notified of result.  Please refer to phone note from today for complete details.   Julaine Hua, [place] 04/24/2019 9:06 AM

## 2019-04-24 NOTE — Telephone Encounter (Signed)
-----   Message from Isaiah Serge, NP sent at 04/24/2019  8:20 AM EST ----- Labs with improved kidney function.  K+ is up slightly, change spironolactone to every other day  recheck BMP in 2 weeks

## 2019-04-30 ENCOUNTER — Telehealth: Payer: Self-pay | Admitting: Internal Medicine

## 2019-04-30 DIAGNOSIS — Z794 Long term (current) use of insulin: Secondary | ICD-10-CM

## 2019-04-30 NOTE — Telephone Encounter (Signed)
Pt is calling to ask what dose of victoza she is to take, states she has been taking 1.8 since summer and now new script states 1.2. have you decreased this? She also needs a refill asap

## 2019-04-30 NOTE — Telephone Encounter (Signed)
Pls contact pt regarding her medicine [phone]

## 2019-05-01 MED ORDER — LIRAGLUTIDE 18 MG/3ML ~~LOC~~ SOPN: 1.8000 mg | PEN_INJECTOR | Freq: Every day | SUBCUTANEOUS | 0 refills | Status: DC

## 2019-05-01 MED FILL — VICTOZA 18 MG/3 ML INJECT P: 18 | 30 days supply | Qty: 9 | Fill #0

## 2019-05-01 NOTE — Telephone Encounter (Signed)
She should be taking 1.8. I have refilled her Rx.

## 2019-05-01 NOTE — Telephone Encounter (Signed)
Called pt and informed her of refill and dose

## 2019-05-02 MED FILL — METFORMIN HCL ER 500 MG TB2: 500 | 30 days supply | Qty: 60 | Fill #5

## 2019-05-07 MED FILL — OMEPRAZOLE 20 MG CAPSULE DR: 20 | 30 days supply | Qty: 30 | Fill #2

## 2019-05-08 ENCOUNTER — Other Ambulatory Visit: Payer: Medicaid Other | Admitting: *Deleted

## 2019-05-08 ENCOUNTER — Other Ambulatory Visit: Payer: Self-pay

## 2019-05-08 DIAGNOSIS — N1832 Chronic kidney disease, stage 3b: Secondary | ICD-10-CM | POA: Diagnosis not present

## 2019-05-09 ENCOUNTER — Telehealth: Payer: Self-pay | Admitting: Interventional Cardiology

## 2019-05-09 LAB — BASIC METABOLIC PANEL
BUN/Creatinine Ratio: 15 (ref 12–28)
BUN: 32 mg/dL — ABNORMAL HIGH (ref 8–27)
CO2: 22 mmol/L (ref 20–29)
Calcium: 9.1 mg/dL (ref 8.7–10.3)
Chloride: 110 mmol/L — ABNORMAL HIGH (ref 96–106)
Creatinine, Ser: 2.2 mg/dL — ABNORMAL HIGH (ref 0.57–1.00)
GFR calc Af Amer: 27 mL/min/{1.73_m2} — ABNORMAL LOW (ref >59)
GFR calc non Af Amer: 23 mL/min/{1.73_m2} — ABNORMAL LOW (ref >59)
Glucose: 159 mg/dL — ABNORMAL HIGH (ref 65–99)
Potassium: 4.8 mmol/L (ref 3.5–5.2)
Sodium: 145 mmol/L — ABNORMAL HIGH (ref 134–144)

## 2019-05-09 NOTE — Telephone Encounter (Signed)
New message:     Patient returning call back from yesterday concerning her lab results. Please call patient back.

## 2019-05-09 NOTE — Telephone Encounter (Signed)
Spoke with pt and reviewed results

## 2019-05-10 ENCOUNTER — Other Ambulatory Visit: Payer: Self-pay | Admitting: *Deleted

## 2019-05-10 DIAGNOSIS — R7989 Other specified abnormal findings of blood chemistry: Secondary | ICD-10-CM

## 2019-05-15 MED FILL — ACCU-CHEK GUIDE TEST STRIP: 30 days supply | Qty: 100 | Fill #1

## 2019-05-16 MED FILL — ISOSORBIDE MN ER 120 MG TAB: 120 | 30 days supply | Qty: 30 | Fill #1

## 2019-05-17 ENCOUNTER — Other Ambulatory Visit: Payer: Self-pay

## 2019-05-17 ENCOUNTER — Other Ambulatory Visit: Payer: Medicaid Other | Admitting: *Deleted

## 2019-05-17 DIAGNOSIS — R7989 Other specified abnormal findings of blood chemistry: Secondary | ICD-10-CM

## 2019-05-17 LAB — BASIC METABOLIC PANEL
BUN/Creatinine Ratio: 16 (ref 12–28)
BUN: 31 mg/dL — ABNORMAL HIGH (ref 8–27)
CO2: 20 mmol/L (ref 20–29)
Calcium: 9.8 mg/dL (ref 8.7–10.3)
Chloride: 107 mmol/L — ABNORMAL HIGH (ref 96–106)
Creatinine, Ser: 1.88 mg/dL — ABNORMAL HIGH (ref 0.57–1.00)
GFR calc Af Amer: 33 mL/min/{1.73_m2} — ABNORMAL LOW (ref >59)
GFR calc non Af Amer: 28 mL/min/{1.73_m2} — ABNORMAL LOW (ref >59)
Glucose: 148 mg/dL — ABNORMAL HIGH (ref 65–99)
Potassium: 4.5 mmol/L (ref 3.5–5.2)
Sodium: 142 mmol/L (ref 134–144)

## 2019-05-22 ENCOUNTER — Encounter: Payer: Medicaid Other | Admitting: Internal Medicine

## 2019-05-23 MED FILL — LOSARTAN POTASSIUM 100 MG T: 100 | 30 days supply | Qty: 30 | Fill #3

## 2019-05-23 MED FILL — JARDIANCE 10 MG TABLET: 10 | 30 days supply | Qty: 30 | Fill #7

## 2019-06-05 ENCOUNTER — Encounter: Payer: Medicaid Other | Admitting: Internal Medicine

## 2019-06-05 MED FILL — OMEPRAZOLE DR 20 MG CAPSULE: 20 | 30 days supply | Qty: 30 | Fill #3

## 2019-06-05 MED FILL — METFORMIN HCL ER 500 MG TB2: 500 | 30 days supply | Qty: 60 | Fill #6

## 2019-06-12 ENCOUNTER — Encounter: Payer: Medicaid Other | Admitting: Internal Medicine

## 2019-06-12 NOTE — Progress Notes (Deleted)
   CC: ***  HPI:  Ms.Brionne L Kammer is a 64 y.o. with PMH as below.   Please see A&P for assessment of the patient's acute and chronic medical conditions.   HTN   HFrEF Currently on lasix 20 mg qd with increase to 40mg  when she noticed increased shortness of breath or LE swelling.    - due for FOBT - due for mammogram   Past Medical History:  Diagnosis Date  . Arthritis    "hands, shoulders, hips, legs" (05/10/2018)  . Arthritis of left hip 08/18/2013   S/p total hip arthroplasty.    . CHF (congestive heart failure) ([place])    2014...@ [facility]  . Chronic lower back pain   . COPD (chronic obstructive pulmonary disease) ([place])   . Healthcare maintenance 06/16/2015  . High cholesterol   . Hypertension   . Influenza A 06/01/2017  . Influenza, pneumonia 06/01/2017  . Left flank pain 10/11/2016  . Myocardial infarction [facility]    "been told that I've had one; don't know when it would have been" (05/10/2018)  . Problem with sexual relationship 10/13/2017  . PVD (peripheral vascular disease) ([place])   . Type II diabetes mellitus ([place])    diagnosed 2001   Review of Systems:  *** 10 point ROS negative except as noted in HPI  Physical Exam:   Constitution: NAD, appears stated age HENT: Eyes:  Cardio: RRR, no m/r/g, no LE edema  Respiratory: CTA, no w/r/r Abdominal: NTTP, soft, non-distended MSK: moving all extremities Neuro: normal affect, a&ox3 GU: Skin: c/d/i    There were no vitals filed for this visit. ***  Assessment & Plan:   See Encounters Tab for problem based charting.  Patient {GC/GE:[id number]::"discussed with","seen with"} Dr. {NAMES:[id number]::"Butcher","Granfortuna","E. Hoffman","Klima","Mullen","Narendra","Raines","Vincent"}

## 2019-06-19 ENCOUNTER — Other Ambulatory Visit: Payer: Self-pay | Admitting: Internal Medicine

## 2019-06-19 DIAGNOSIS — I10 Essential (primary) hypertension: Secondary | ICD-10-CM

## 2019-06-19 DIAGNOSIS — I5042 Chronic combined systolic (congestive) and diastolic (congestive) heart failure: Secondary | ICD-10-CM

## 2019-06-19 MED FILL — JARDIANCE 10 MG TABLET: 10 | 30 days supply | Qty: 30 | Fill #8

## 2019-06-19 MED FILL — VICTOZA 18 MG/3 ML INJECT P: 18 | 30 days supply | Qty: 9 | Fill #1

## 2019-06-19 MED FILL — ISOSORBIDE MN ER 120 MG TAB: 120 | 30 days supply | Qty: 30 | Fill #2

## 2019-06-20 MED FILL — LOSARTAN POTASSIUM 100 MG T: 100 | 30 days supply | Qty: 30 | Fill #0

## 2019-07-03 ENCOUNTER — Other Ambulatory Visit: Payer: Self-pay

## 2019-07-03 ENCOUNTER — Telehealth: Payer: Self-pay | Admitting: Interventional Cardiology

## 2019-07-03 ENCOUNTER — Ambulatory Visit (INDEPENDENT_AMBULATORY_CARE_PROVIDER_SITE_OTHER): Payer: Medicaid Other | Admitting: Internal Medicine

## 2019-07-03 DIAGNOSIS — Z794 Long term (current) use of insulin: Secondary | ICD-10-CM

## 2019-07-03 DIAGNOSIS — I509 Heart failure, unspecified: Secondary | ICD-10-CM

## 2019-07-03 DIAGNOSIS — Z0000 Encounter for general adult medical examination without abnormal findings: Secondary | ICD-10-CM

## 2019-07-03 DIAGNOSIS — Z1231 Encounter for screening mammogram for malignant neoplasm of breast: Secondary | ICD-10-CM

## 2019-07-03 DIAGNOSIS — I5042 Chronic combined systolic (congestive) and diastolic (congestive) heart failure: Secondary | ICD-10-CM

## 2019-07-03 DIAGNOSIS — Z1211 Encounter for screening for malignant neoplasm of colon: Secondary | ICD-10-CM

## 2019-07-03 NOTE — Progress Notes (Signed)
   CC: heart failure  This is a telephone encounter between [name] and Marty Heck on 07/03/2019 for heart failure. The visit was conducted with the patient located at home and Marty Heck at [facility]. The patient's identity was confirmed using their DOB and current address. The patient has consented to being evaluated through a telephone encounter and understands the associated risks (an examination cannot be done and the patient may need to come in for an appointment) / benefits (allows the patient to remain at home, decreasing exposure to coronavirus). I personally spent 18 minutes on medical discussion.   HPI:  [name] is a 64 y.o. with PMH as below.   Please see A&P for assessment of the patient's acute and chronic medical conditions.   Past Medical History:  Diagnosis Date  . Arthritis    "hands, shoulders, hips, legs" (05/10/2018)  . Arthritis of left hip 08/18/2013   S/p total hip arthroplasty.    . CHF (congestive heart failure) ([place])    2014...@ [facility]  . Chronic lower back pain   . COPD (chronic obstructive pulmonary disease) ([place])   . Healthcare maintenance 06/16/2015  . High cholesterol   . Hypertension   . Influenza A 06/01/2017  . Influenza, pneumonia 06/01/2017  . Left flank pain 10/11/2016  . Myocardial infarction [facility])    "been told that I've had one; don't know when it would have been" (05/10/2018)  . Problem with sexual relationship 10/13/2017  . PVD (peripheral vascular disease) ([place])   . Type II diabetes mellitus ([place])    diagnosed 2001   Review of Systems:   Review of Systems  Respiratory: Negative for cough, shortness of breath and wheezing.   Cardiovascular: Negative for chest pain, orthopnea and leg swelling.  Gastrointestinal: Negative for abdominal pain, constipation and diarrhea.  Genitourinary: Negative for dysuria, frequency and urgency.  Neurological: Negative for dizziness and weakness.    Assessment & Plan:   See Encounters Tab  for problem based charting.  Patient discussed with Dr. Dareen Piano

## 2019-07-03 NOTE — Assessment & Plan Note (Signed)
-   needs screening mammogram - previously ordered but states she never was able to get in touch with anyone. Appointment made and confirmed for her for April 6th at 9:50 am. - due for screening colonoscopy. Previously unable to get this done as she needed clearance from cardiology. She has follow-up with them this Friday. Discussed with office and they will evaluate her for colonoscopy clearance. Referral sent to GI.

## 2019-07-03 NOTE — Telephone Encounter (Signed)
      [facility] Pre-operative Risk Assessment    Request for surgical clearance:  1. What type of surgery is being performed? Colonoscopy   2. When is this surgery scheduled? TBD  3. What type of clearance is required (medical clearance vs. Pharmacy clearance to hold med vs. Both)? Medical   4. Are there any medications that need to be held prior to surgery and how long? No  5. Practice name and name of physician performing surgery?  [facility]  6. What is your office phone number [phone]   7.   What is your office fax number [phone]  8.   Anesthesia type (None, local, MAC, general) ? Not sure   Angeline Kirby Funk 07/03/2019, 3:43 PM  _________________________________________________________________   (provider comments below)

## 2019-07-03 NOTE — Assessment & Plan Note (Signed)
  She is taking metformin 500 mg xr bid and victoza and jardiance.  The battery on her glucometer has run out. She uses accucheck. Last she was able to check her glucose was around 150 prior to breakfast which is when she checks her glucose.   - f/u for a1c in clinic  - cont. Jardiance, victoza and metformin  - cont. lipitor 40 mg  - BMP at follow-up appointment, she just had one 1/15

## 2019-07-03 NOTE — Assessment & Plan Note (Signed)
She is taking lasix 20 mg qd which is working well for her. She is also taking her losartan, imdur, and coreg 25 mg. She denies shortness of breath, lower extremity swelling or weakness. Recent repeat labs for renal function have been stable.   - cont. Lasix 20 mg - cont. Losartan 100 mg qd, imdur 120 mg qd, and coreg 25 mg bid - f/u with cardiology 3/5

## 2019-07-04 ENCOUNTER — Encounter: Payer: Self-pay | Admitting: Physician Assistant

## 2019-07-04 NOTE — Telephone Encounter (Signed)
I will forward to Dr. Faythe Casa for upcoming appt 07/05/19. I will remove from the pre op call back pool.

## 2019-07-04 NOTE — Telephone Encounter (Signed)
Patient has followup with Dr. Tamala Julian tomorrow, please add preop clearance to the visit description. Patient has a prior h/o volume overload, will need to be reassessed prior to clearance.

## 2019-07-04 NOTE — Progress Notes (Signed)
Cardiology Office Note:    Date:  07/05/2019   ID:  [name], DOB January 31, 1956, MRN [id number]  PCP:  Marty Heck, DO  Cardiologist:  Sinclair Grooms, MD  Normal at 55 to 60%. Referring MD: Marty Heck, DO   Chief Complaint  Patient presents with  . Coronary Artery Disease  . Congestive Heart Failure    History of Present Illness:    [name] is a 64 y.o. female with a hx of  HTN,uncontrolledDM, PVD, chronic diastolic heart failure, prior history of low EF in 2015 of 30-35%, improved EF in 2018, and history of abnormal NST but nopriorhx of cardiac cath. She was admitted 06/01/17-06/05/17 for influenza A. Echo that admission showed reduced LVEF of 25-30%.   Needs clearance for colonoscopy.  [name] is doing well.  She denies significant dyspnea.  She is able to lie flat.  She occasionally will have tightness in her chest.  Sublingual nitroglycerin relieves it.  This pattern has been stable.  She is known to have diffuse coronary disease with a totally occluded right coronary artery and moderately severe left coronary disease.  Symptoms are being used to help determine when revascularization (CABG) will be required.  There is a shim was 2019.  EF at that time was 35%.  Most recent EF is normal at 55 to 60%.  Past Medical History:  Diagnosis Date  . Arthritis    "hands, shoulders, hips, legs" (05/10/2018)  . Arthritis of left hip 08/18/2013   S/p total hip arthroplasty.    . CHF (congestive heart failure) ([place])    2014...@ [facility]  . Chronic lower back pain   . COPD (chronic obstructive pulmonary disease) ([place])   . Healthcare maintenance 06/16/2015  . High cholesterol   . Hypertension   . Influenza A 06/01/2017  . Influenza, pneumonia 06/01/2017  . Left flank pain 10/11/2016  . Myocardial infarction [facility])    "been told that I've had one; don't know when it would have been" (05/10/2018)  . Problem with sexual relationship 10/13/2017  . PVD (peripheral  vascular disease) ([place])   . Type II diabetes mellitus ([place])    diagnosed 2001    Past Surgical History:  Procedure Laterality Date  . ABDOMINAL ANGIOGRAM  12/28/2012  . ABDOMINAL ANGIOGRAM N/A 12/29/2011   Procedure: ABDOMINAL ANGIOGRAM;  Surgeon: Conrad Kankakee, MD;  Location: [facility] CATH LAB;  Service: Cardiovascular;  Laterality: N/A;  . ABDOMINAL AORTAGRAM N/A 12/21/2012   Procedure: ABDOMINAL Maxcine Ham;  Surgeon: Elam Dutch, MD;  Location: [facility] CATH LAB;  Service: Cardiovascular;  Laterality: N/A;  . EYE SURGERY Bilateral ~ 2018   "laser; related to my diabetes"  . JOINT REPLACEMENT    . PERIPHERAL VASCULAR CATHETERIZATION     "had to have some veins unclogged cause I was in pain when I walked" (05/10/2018)  . RIGHT/LEFT HEART CATH AND CORONARY ANGIOGRAPHY N/A 04/30/2018   Procedure: RIGHT/LEFT HEART CATH AND CORONARY ANGIOGRAPHY;  Surgeon: [name], MD;  Location: [place] CV LAB;  Service: Cardiovascular;  Laterality: N/A;  . TOTAL HIP ARTHROPLASTY Left 08/19/2013   Procedure: TOTAL HIP ARTHROPLASTY;  Surgeon: Kerin Salen, MD;  Location: [place];  Service: Orthopedics;  Laterality: Left;  . TUBAL LIGATION      Current Medications: Current Meds  Medication Sig  . Accu-Chek FastClix Lancets MISC Check blood sugar up to  3x/day before meals  . albuterol (PROVENTIL HFA) 108 (90 Base) MCG/ACT  inhaler Inhale 2 puffs into the lungs every 6 (six) hours as needed for wheezing or shortness of breath.  Marland Kitchen aspirin EC 81 MG tablet Take 1 tablet (81 mg total) by mouth daily. IM program, [facility]  . atorvastatin (LIPITOR) 40 MG tablet Take 1 tablet (40 mg total) by mouth daily.  . blood glucose meter kit and supplies KIT Dispense based on patient and insurance preference. Use up to four times daily as directed. (FOR ICD-9 250.00, 250.01).  . Blood Glucose Monitoring Suppl (ACCU-CHEK GUIDE) w/Device KIT 1 each by Does not apply route 3 (three) times daily.  . carvedilol (COREG) 25 MG tablet  Take 0.5 tablets (12.5 mg total) by mouth 2 (two) times daily.  . Cholecalciferol (VITAMIN D3) 125 MCG (5000 UT) CAPS Take 5,000 Units by mouth daily.  . diclofenac sodium (VOLTAREN) 1 % GEL Apply 2 g topically 4 (four) times daily.  . empagliflozin (JARDIANCE) 10 MG TABS tablet Take 10 mg by mouth daily.  . furosemide (LASIX) 20 MG tablet Take 1 tablet (20 mg total) by mouth daily.  Marland Kitchen glucose blood (ACCU-CHEK GUIDE) test strip Check blood sugar up to 3x/day before meals  . isosorbide mononitrate (IMDUR) 120 MG 24 hr tablet Take 1 tablet (120 mg total) by mouth daily.  Marland Kitchen liraglutide (VICTOZA) 18 MG/3ML SOPN Inject 0.3 mLs (1.8 mg total) into the skin daily.  Marland Kitchen losartan (COZAAR) 100 MG tablet TAKE 1 TABLET BY MOUTH DAILY.  . metFORMIN (GLUCOPHAGE-XR) 500 MG 24 hr tablet Take 1 tablet (500 mg total) by mouth 2 (two) times daily with a meal.  . Multiple Vitamins-Minerals (MULTIVITAMIN GUMMIES ADULT) CHEW Chew 2 tablets by mouth daily.  . nitroGLYCERIN (NITROSTAT) 0.4 MG SL tablet Place 1 tablet (0.4 mg total) under the tongue every 5 (five) minutes as needed for chest pain.  Marland Kitchen omeprazole (PRILOSEC) 20 MG capsule TAKE 1 CAPSULE (20 MG TOTAL) BY MOUTH DAILY.  Marland Kitchen UNIFINE PENTIPS 31G X 5 MM MISC USE TO INJECT 20 UNITS DAILY     Allergies:   Lisinopril and Plavix [clopidogrel bisulfate]   Social History   Socioeconomic History  . Marital status: Married    Spouse name: Not on file  . Number of children: Not on file  . Years of education: 12,college  . Highest education level: Not on file  Occupational History  . Not on file  Tobacco Use  . Smoking status: Former Smoker    Years: 7.00    Types: Cigarettes    Quit date: 05/08/1987    Years since quitting: 32.1  . Smokeless tobacco: Never Used  . Tobacco comment: 05/10/2018 "smoked when I drank"  Substance and Sexual Activity  . Alcohol use: Yes    Alcohol/week: 0.0 standard drinks    Comment: 05/10/2018 "glass of wine on holidays"  . Drug use:  Not Currently  . Sexual activity: Not Currently  Other Topics Concern  . Not on file  Social History Narrative  . Not on file   Social Determinants of Health   Financial Resource Strain:   . Difficulty of Paying Living Expenses: Not on file  Food Insecurity:   . Worried About Charity fundraiser in the Last Year: Not on file  . Ran Out of Food in the Last Year: Not on file  Transportation Needs:   . Lack of Transportation (Medical): Not on file  . Lack of Transportation (Non-Medical): Not on file  Physical Activity:   . Days of Exercise per  Week: Not on file  . Minutes of Exercise per Session: Not on file  Stress:   . Feeling of Stress : Not on file  Social Connections:   . Frequency of Communication with Friends and Family: Not on file  . Frequency of Social Gatherings with Friends and Family: Not on file  . Attends Religious Services: Not on file  . Active Member of Clubs or Organizations: Not on file  . Attends Archivist Meetings: Not on file  . Marital Status: Not on file     Family History: The patient's family history includes Diabetes in her mother.  ROS:   Please see the history of present illness.    She has trouble with her right hip.  She is to undergo upcoming colonoscopy.  Part of today's visit is to clear the patient for colonoscopy.  All other systems reviewed and are negative.  EKGs/Labs/Other Studies Reviewed:    The following studies were reviewed today: 2D Doppler echocardiogram 2020 IMPRESSIONS    1. The left ventricle has normal systolic function, with an ejection  fraction of 55-60%. The cavity size was normal. There is severely  increased left ventricular wall thickness. Left ventricular diastolic  Doppler parameters are consistent with impaired  relaxation.  2. The right ventricle has normal systolic function. The cavity was  normal.  3. Left atrial size was moderately dilated.  4. Trivial pericardial effusion is present.    5. The mitral valve is abnormal. Mild thickening of the mitral valve  leaflet. There is moderate mitral annular calcification present.  6. The tricuspid valve is grossly normal.  7. The aortic valve is tricuspid. Mild calcification of the aortic valve.  Aortic valve regurgitation is mild by color flow Doppler. No stenosis of  the aortic valve.  8. Normal LV systolic function; severe LVH; mild diastolic dysfunction;  moderate LAE; mild AI and MR. 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   EKG:  EKG in November 2020 demonstrates sinus rhythm with left bundle branch block.  Recent Labs: 11/14/2018: ALT 23 03/12/2019: B Natriuretic Peptide 590.0; Hemoglobin 10.6; Platelets 208 05/17/2019: BUN 31; Creatinine, Ser 1.88; Potassium 4.5; Sodium 142  Recent Lipid Panel    Component Value Date/Time   CHOL 149 08/31/2017 0941   TRIG 74 08/31/2017 0941   HDL 60 08/31/2017 0941   CHOLHDL 2.5 08/31/2017 0941   CHOLHDL 3.7 11/08/2012 1331   VLDL 19 11/08/2012 1331   LDLCALC 74 08/31/2017 0941    Physical Exam:    VS:  BP (!) 144/76   Pulse 88   Ht 5' 3" (1.6 m)   Wt 174 lb 8 oz (79.2 kg)   SpO2 98%   BMI 30.91 kg/m     Wt Readings from Last 3 Encounters:  07/05/19 174 lb 8 oz (79.2 kg)  04/23/19 170 lb (77.1 kg)  03/20/19 167 lb 12.8 oz (76.1 kg)     GEN: Moderate obesity. No acute distress HEENT: Normal NECK: No JVD. LYMPHATICS: No lymphadenopathy CARDIAC: S4 gallop with RRR without murmur, S3 gallop, or edema. VASCULAR:.  Diminished pulses bilaterally.  No palpable edema RESPIRATORY:  Clear to auscultation without rales, wheezing or rhonchi  ABDOMEN: Soft, non-tender, non-distended, No pulsatile mass, MUSCULOSKELETAL: No deformity  SKIN: Warm and dry NEUROLOGIC:  Alert and oriented x 3 PSYCHIATRIC:  Normal affect   ASSESSMENT:    1. Chronic combined systolic and diastolic CHF, NYHA class 2 ([place])   2. Coronary artery disease involving native heart,  angina presence unspecified, unspecified vessel or lesion type   3. Essential hypertension   4. LBBB (left bundle branch block)   5. DM (diabetes mellitus) type II uncontrolled, periph vascular disorder ([place])   6. Hyperlipidemia, unspecified hyperlipidemia type   7. Educated about COVID-19 virus infection   8. Preop cardiovascular exam    PLAN:    In order of problems listed above:  1. Systolic function has improved on medical therapy which carvedilol, Imdur, Jardiance, Cozaar and furosemide.  These therapies will be continued. 2. Aggressive risk factor modification with high intensity statin therapy, Lipitor will be continued.  Encouraged to walk relative to secondary prevention.  No longer smokes. 3. Target blood pressure 130/80 mmHg 4. No change 5. Target A1c 7 or less.  Agree with SGLT2 therapy. 6. Most recent LDL 74 with target of 70.  Continue atorvastatin. 7. COVID-19 vaccine is endorsed.  Social distancing and mask wearing is being adhered to. 8. Cleared for upcoming colonoscopy and probable right hip surgery if she remains stable clinically..   Medication Adjustments/Labs and Tests Ordered: Current medicines are reviewed at length with the patient today.  Concerns regarding medicines are outlined above.  No orders of the defined types were placed in this encounter.  No orders of the defined types were placed in this encounter.   Patient Instructions  Medication Instructions:  Your physician recommends that you continue on your current medications as directed. Please refer to the Current Medication list given to you today.  *If you need a refill on your cardiac medications before your next appointment, please call your pharmacy*   Lab Work: None If you have labs (blood work) drawn today and your tests are completely normal, you will receive your results only by: Marland Kitchen MyChart Message (if you have MyChart) OR . A paper copy in the mail If you have any lab test that is  abnormal or we need to change your treatment, we will call you to review the results.   Testing/Procedures: None   Follow-Up: At [facility], you and your health needs are our priority.  As part of our continuing mission to provide you with exceptional heart care, we have created designated Provider Care Teams.  These Care Teams include your primary Cardiologist (physician) and Advanced Practice Providers (APPs -  Physician Assistants and Nurse Practitioners) who all work together to provide you with the care you need, when you need it.  We recommend signing up for the patient portal called "MyChart".  Sign up information is provided on this After Visit Summary.  MyChart is used to connect with patients for Virtual Visits (Telemedicine).  Patients are able to view lab/test results, encounter notes, upcoming appointments, etc.  Non-urgent messages can be sent to your provider as well.   To learn more about what you can do with MyChart, go to [email or web address].    Your next appointment:   6 month(s)  The format for your next appointment:   In Person  Provider:   You may see Sinclair Grooms, MD or one of the following Advanced Practice Providers on your designated Care Team:    Truitt Merle, NP  Cecilie Kicks, NP  Kathyrn Drown, NP    Other Instructions      Signed, Sinclair Grooms, MD  07/05/2019 3:52 PM    [place]

## 2019-07-05 ENCOUNTER — Other Ambulatory Visit: Payer: Self-pay

## 2019-07-05 ENCOUNTER — Ambulatory Visit: Payer: Medicaid Other | Admitting: Interventional Cardiology

## 2019-07-05 ENCOUNTER — Encounter: Payer: Self-pay | Admitting: Interventional Cardiology

## 2019-07-05 VITALS — BP 144/76 | HR 88 | Ht 63.0 in | Wt 174.5 lb

## 2019-07-05 DIAGNOSIS — I5042 Chronic combined systolic (congestive) and diastolic (congestive) heart failure: Secondary | ICD-10-CM

## 2019-07-05 DIAGNOSIS — I2510 Atherosclerotic heart disease of native coronary artery without angina pectoris: Secondary | ICD-10-CM

## 2019-07-05 DIAGNOSIS — I10 Essential (primary) hypertension: Secondary | ICD-10-CM

## 2019-07-05 DIAGNOSIS — I447 Left bundle-branch block, unspecified: Secondary | ICD-10-CM | POA: Diagnosis not present

## 2019-07-05 DIAGNOSIS — Z7189 Other specified counseling: Secondary | ICD-10-CM | POA: Diagnosis not present

## 2019-07-05 DIAGNOSIS — E785 Hyperlipidemia, unspecified: Secondary | ICD-10-CM | POA: Diagnosis not present

## 2019-07-05 DIAGNOSIS — E1165 Type 2 diabetes mellitus with hyperglycemia: Secondary | ICD-10-CM | POA: Diagnosis not present

## 2019-07-05 DIAGNOSIS — Z01810 Encounter for preprocedural cardiovascular examination: Secondary | ICD-10-CM | POA: Diagnosis not present

## 2019-07-05 DIAGNOSIS — IMO0002 Reserved for concepts with insufficient information to code with codable children: Secondary | ICD-10-CM

## 2019-07-05 DIAGNOSIS — E1151 Type 2 diabetes mellitus with diabetic peripheral angiopathy without gangrene: Secondary | ICD-10-CM | POA: Diagnosis not present

## 2019-07-05 NOTE — Patient Instructions (Signed)

## 2019-07-11 MED FILL — OMEPRAZOLE DR 20 MG CAPSULE: 20 | 30 days supply | Qty: 30 | Fill #4

## 2019-07-11 MED FILL — CARVEDILOL 25 MG TABLET: 25 | 30 days supply | Qty: 30 | Fill #2

## 2019-07-11 MED FILL — METFORMIN HCL ER 500 MG TB2: 500 | 30 days supply | Qty: 60 | Fill #7

## 2019-07-12 ENCOUNTER — Ambulatory Visit: Payer: Medicaid Other | Admitting: Physician Assistant

## 2019-07-17 NOTE — Progress Notes (Signed)
Internal Medicine Clinic Attending  Case discussed with Dr. Seawell at the time of the visit.  We reviewed the resident's history and exam and pertinent patient test results.  I agree with the assessment, diagnosis, and plan of care documented in the resident's note.    

## 2019-07-22 ENCOUNTER — Telehealth: Payer: Self-pay | Admitting: Dietician

## 2019-07-22 NOTE — Telephone Encounter (Signed)
Called to follow up with her about her meter not working and needing new batteries. She has not gotten batteries for her meter yet, but plans to get them today. She'll call if she has any problems

## 2019-07-22 NOTE — Telephone Encounter (Signed)
Left message for [name] to contact diabetes educator if needed.

## 2019-07-24 ENCOUNTER — Other Ambulatory Visit (HOSPITAL_COMMUNITY): Payer: Self-pay | Admitting: Interventional Cardiology

## 2019-07-24 ENCOUNTER — Other Ambulatory Visit: Payer: Self-pay | Admitting: Interventional Cardiology

## 2019-07-24 DIAGNOSIS — I10 Essential (primary) hypertension: Secondary | ICD-10-CM

## 2019-07-24 DIAGNOSIS — I428 Other cardiomyopathies: Secondary | ICD-10-CM

## 2019-07-24 MED FILL — ISOSORBIDE MN ER 120 MG TAB: 120 | 30 days supply | Qty: 30 | Fill #0

## 2019-07-29 ENCOUNTER — Other Ambulatory Visit: Payer: Self-pay | Admitting: Internal Medicine

## 2019-07-29 DIAGNOSIS — N183 Chronic kidney disease, stage 3 unspecified: Secondary | ICD-10-CM

## 2019-07-29 MED FILL — JARDIANCE 10 MG TABLET: 10 | 30 days supply | Qty: 30 | Fill #9

## 2019-07-29 MED FILL — LOSARTAN POTASSIUM 100 MG T: 100 | 30 days supply | Qty: 30 | Fill #1

## 2019-07-30 ENCOUNTER — Encounter: Payer: Self-pay | Admitting: *Deleted

## 2019-07-31 MED FILL — VICTOZA 18 MG/3 ML INJECT P: 18 | 30 days supply | Qty: 9 | Fill #0

## 2019-08-06 ENCOUNTER — Ambulatory Visit: Payer: Medicaid Other

## 2019-08-12 MED FILL — CARVEDILOL 25 MG TABLET: 25 | 30 days supply | Qty: 30 | Fill #3

## 2019-08-12 MED FILL — UNIFINE PENTIPS 31GX3/16: 31G X 5 MM | 90 days supply | Qty: 100 | Fill #1

## 2019-08-12 MED FILL — METFORMIN HCL ER 500 MG TB2: 500 | 30 days supply | Qty: 60 | Fill #8

## 2019-08-12 MED FILL — OMEPRAZOLE DR 20 MG CAPSULE: 20 | 30 days supply | Qty: 30 | Fill #5

## 2019-08-13 ENCOUNTER — Other Ambulatory Visit: Payer: Self-pay | Admitting: Internal Medicine

## 2019-08-13 ENCOUNTER — Telehealth: Payer: Self-pay | Admitting: *Deleted

## 2019-08-13 DIAGNOSIS — I5042 Chronic combined systolic (congestive) and diastolic (congestive) heart failure: Secondary | ICD-10-CM

## 2019-08-13 MED ORDER — CARVEDILOL 12.5 MG PO TABS: 12.5000 mg | ORAL_TABLET | Freq: Two times a day (BID) | ORAL | 2 refills | Status: DC

## 2019-08-13 NOTE — Telephone Encounter (Signed)
Pharmacist calls from [facility] and ask that carvedilol be changed from 1/2 tablet 25mg  daily to 12.5mg  tablet 2 times daily because pt cannot break tabs into 2 pcs. Please send new script to [facility]

## 2019-08-14 ENCOUNTER — Encounter: Payer: Medicaid Other | Admitting: Internal Medicine

## 2019-08-15 ENCOUNTER — Encounter: Payer: Self-pay | Admitting: Gastroenterology

## 2019-08-20 ENCOUNTER — Encounter: Payer: Medicaid Other | Admitting: Gastroenterology

## 2019-08-27 ENCOUNTER — Other Ambulatory Visit: Payer: Self-pay | Admitting: Internal Medicine

## 2019-08-27 DIAGNOSIS — Z794 Long term (current) use of insulin: Secondary | ICD-10-CM

## 2019-08-27 MED FILL — VICTOZA 18 MG/3 ML INJECT P: 18 | 30 days supply | Qty: 9 | Fill #0

## 2019-08-27 MED FILL — CARVEDILOL 12.5 MG TABLET: 12.5 | 30 days supply | Qty: 60 | Fill #0

## 2019-08-27 NOTE — Telephone Encounter (Signed)
Next Appt With Internal Medicine ([place], DO) 09/04/2019 at 1:15 PM

## 2019-09-02 MED FILL — ISOSORBIDE MN ER 120 MG TAB: 120 | 30 days supply | Qty: 30 | Fill #1

## 2019-09-02 MED FILL — LOSARTAN POTASSIUM 100 MG T: 100 | 30 days supply | Qty: 30 | Fill #2

## 2019-09-04 ENCOUNTER — Encounter: Payer: Medicaid Other | Admitting: Internal Medicine

## 2019-09-05 MED FILL — JARDIANCE 10 MG TABLET: 10 | 30 days supply | Qty: 30 | Fill #10

## 2019-09-11 ENCOUNTER — Telehealth: Payer: Self-pay | Admitting: *Deleted

## 2019-09-11 NOTE — Telephone Encounter (Signed)
OK for proceeding with Direct procedure. If she wants to be seen in clinic I am happy to see her too. Thanks. GM

## 2019-09-11 NOTE — Telephone Encounter (Signed)
Dr. Rush Landmark,  This pt is scheduled for a screening colonoscopy 10-03-19.  Per TE 05-10-2018- her EF at the time was too low for LEC and you advised an OV.  Per her last OV with cardiologist on 07-05-19, her EF is 55-60%, no aortic valve stenosis and has been cleared for her colonoscopy.  Are you ok to proceed with a direct or would you like an OV?  Thanks, [employer]

## 2019-09-11 NOTE — Telephone Encounter (Signed)
noted 

## 2019-09-17 MED FILL — METFORMIN HCL ER 500 MG TB2: 500 | 30 days supply | Qty: 60 | Fill #9

## 2019-09-20 ENCOUNTER — Telehealth: Payer: Self-pay

## 2019-09-20 NOTE — Telephone Encounter (Signed)
Pt was NS for PV.  Reached pt on contact to NS, stated she was traveling out of town and would call back to R/S.    Confirmed with pt that both PV and procedure will be cancelled.  Pt voiced understanding and confirmed she would call back to reschedule.

## 2019-09-24 ENCOUNTER — Encounter: Payer: Self-pay | Admitting: Internal Medicine

## 2019-09-24 ENCOUNTER — Telehealth: Payer: Self-pay | Admitting: *Deleted

## 2019-09-24 NOTE — Telephone Encounter (Addendum)
Information was sent though [employer] for PA for Victoza.  Awaiting determination. Con# [id number] Nelva Nay, RN 09/24/2019 4:48 PM. PA for Victoza was approved 09/24/2019 thru 10/24/2019.  I [id number].  Sander Nephew, RN 09/25/2019 9:02 AM

## 2019-09-24 NOTE — Progress Notes (Signed)
   CC: type II diabetes  HPI:  [name] is a 64 y.o. with PMH as below.   Please see A&P for assessment of the patient's acute and chronic medical conditions.   Past Medical History:  Diagnosis Date  . Arthritis    "hands, shoulders, hips, legs" (05/10/2018)  . Arthritis of left hip 08/18/2013   S/p total hip arthroplasty.    . CHF (congestive heart failure) ([place])    2014...@ [facility]  . Chronic lower back pain   . COPD (chronic obstructive pulmonary disease) ([place])   . Healthcare maintenance 06/16/2015  . High cholesterol   . Hypertension   . Influenza A 06/01/2017  . Influenza, pneumonia 06/01/2017  . Left flank pain 10/11/2016  . Myocardial infarction [facility])    "been told that I've had one; don't know when it would have been" (05/10/2018)  . Problem with sexual relationship 10/13/2017  . PVD (peripheral vascular disease) ([place])   . Shortness of breath 03/12/2019  . Type II diabetes mellitus ([place])    diagnosed 2001   Review of Systems:   10 point ROS negative except as noted in HPI  Physical Exam:   Constitution: NAD, appears stated age 63: RRR, no m/r/g, no LE edema, no JVD Respiratory: CTA, no w/r/r Abdominal: NTTP, soft, non-distended, +BS Neuro: normal affect, a&ox3 Skin: c/d/i   Bedside US No ascites or abdominal fluids on exam. Bladder 8x8x5.5cm, equivalent to 169mL.   Vitals:   09/25/19 1344  BP: (!) 162/75  Pulse: 87  Temp: 98.1 F (36.7 C)  TempSrc: Oral  SpO2: 99%  Weight: 177 lb 3.2 oz (80.4 kg)  Height: 5\' 3"  (1.6 m)    Assessment & Plan:   See Encounters Tab for problem based charting.  Patient discussed with Dr. Rebeca Alert

## 2019-09-25 ENCOUNTER — Ambulatory Visit: Payer: Medicaid Other | Admitting: Internal Medicine

## 2019-09-25 ENCOUNTER — Other Ambulatory Visit (HOSPITAL_COMMUNITY): Payer: Self-pay | Admitting: Internal Medicine

## 2019-09-25 VITALS — BP 162/75 | HR 87 | Temp 98.1°F | Ht 63.0 in | Wt 177.2 lb

## 2019-09-25 DIAGNOSIS — Z0000 Encounter for general adult medical examination without abnormal findings: Secondary | ICD-10-CM | POA: Diagnosis not present

## 2019-09-25 DIAGNOSIS — IMO0002 Reserved for concepts with insufficient information to code with codable children: Secondary | ICD-10-CM

## 2019-09-25 DIAGNOSIS — I130 Hypertensive heart and chronic kidney disease with heart failure and stage 1 through stage 4 chronic kidney disease, or unspecified chronic kidney disease: Secondary | ICD-10-CM | POA: Diagnosis not present

## 2019-09-25 DIAGNOSIS — N814 Uterovaginal prolapse, unspecified: Secondary | ICD-10-CM

## 2019-09-25 DIAGNOSIS — Z7984 Long term (current) use of oral hypoglycemic drugs: Secondary | ICD-10-CM

## 2019-09-25 DIAGNOSIS — N812 Incomplete uterovaginal prolapse: Secondary | ICD-10-CM

## 2019-09-25 DIAGNOSIS — E1122 Type 2 diabetes mellitus with diabetic chronic kidney disease: Secondary | ICD-10-CM

## 2019-09-25 DIAGNOSIS — Z794 Long term (current) use of insulin: Secondary | ICD-10-CM | POA: Diagnosis not present

## 2019-09-25 DIAGNOSIS — K219 Gastro-esophageal reflux disease without esophagitis: Secondary | ICD-10-CM

## 2019-09-25 DIAGNOSIS — I5042 Chronic combined systolic (congestive) and diastolic (congestive) heart failure: Secondary | ICD-10-CM | POA: Diagnosis not present

## 2019-09-25 DIAGNOSIS — N183 Chronic kidney disease, stage 3 unspecified: Secondary | ICD-10-CM | POA: Diagnosis not present

## 2019-09-25 DIAGNOSIS — R198 Other specified symptoms and signs involving the digestive system and abdomen: Secondary | ICD-10-CM | POA: Diagnosis not present

## 2019-09-25 DIAGNOSIS — Z1231 Encounter for screening mammogram for malignant neoplasm of breast: Secondary | ICD-10-CM

## 2019-09-25 DIAGNOSIS — E1165 Type 2 diabetes mellitus with hyperglycemia: Secondary | ICD-10-CM | POA: Diagnosis not present

## 2019-09-25 DIAGNOSIS — I10 Essential (primary) hypertension: Secondary | ICD-10-CM

## 2019-09-25 LAB — POCT GLYCOSYLATED HEMOGLOBIN (HGB A1C): Hemoglobin A1C: 7.8 % — AB (ref 4.0–5.6)

## 2019-09-25 LAB — GLUCOSE, CAPILLARY: Glucose-Capillary: 157 mg/dL — ABNORMAL HIGH (ref 70–99)

## 2019-09-25 MED ORDER — METFORMIN HCL ER 500 MG PO TB24: 1000.0000 mg | ORAL_TABLET | Freq: Two times a day (BID) | ORAL | 11 refills | Status: DC

## 2019-09-25 MED ORDER — TRULICITY 0.75 MG/0.5ML ~~LOC~~ SOAJ: 0.7500 mg | SUBCUTANEOUS | 5 refills | Status: DC

## 2019-09-25 MED FILL — TRULICITY 0.75 MG/0.5 ML PE: 0.75 | 28 days supply | Qty: 2 | Fill #0

## 2019-09-25 NOTE — Patient Instructions (Signed)
Thank you for allowing [place] to provide your care today. Today we discussed your type II diabetes, abdominal fullness, and heart failure   I have ordered the following labs for you:  Complete metabolic panel    I will call if any are abnormal.    Today we made the following changes to your medications:   Please START taking  Trulicity - inject once per week   Please STOP taking   Victoza  Continue taking this until you are able to get your trulicity  Please follow-up in one month for telehealth visit for glucose. Please record your glucose daily during this time in the mornings prior to breakfast.    Please keep a food journal of what you are eating. This will help you determine if there are certain foods causing you to bloat.   I have placed a referral to urogynecology for your uterine prolapse.   Please call the internal medicine center clinic if you have any questions or concerns, we may be able to help and keep you from a long and expensive emergency room wait. Our clinic and after hours phone number is [phone], the best time to call is Monday through Friday 9 am to 4 pm but there is always someone available 24/7 if you have an emergency. If you need medication refills please notify your pharmacy one week in advance and they will send [place] a request.

## 2019-09-25 NOTE — Progress Notes (Signed)
Internal Medicine Clinic Attending  Case discussed with Dr. Seawell at the time of the visit.  We reviewed the resident's history and exam and pertinent patient test results.  I agree with the assessment, diagnosis, and plan of care documented in the resident's note.  Tekisha Darcey, M.D., Ph.D.  

## 2019-09-26 ENCOUNTER — Other Ambulatory Visit: Payer: Self-pay | Admitting: Internal Medicine

## 2019-09-26 DIAGNOSIS — IMO0002 Reserved for concepts with insufficient information to code with codable children: Secondary | ICD-10-CM

## 2019-09-26 DIAGNOSIS — R198 Other specified symptoms and signs involving the digestive system and abdomen: Secondary | ICD-10-CM | POA: Insufficient documentation

## 2019-09-26 HISTORY — DX: Other specified symptoms and signs involving the digestive system and abdomen: R19.8

## 2019-09-26 LAB — CMP14 + ANION GAP
ALT: 15 IU/L (ref 0–32)
AST: 19 IU/L (ref 0–40)
Albumin/Globulin Ratio: 1.2 (ref 1.2–2.2)
Albumin: 3.9 g/dL (ref 3.8–4.8)
Alkaline Phosphatase: 109 IU/L (ref 48–121)
Anion Gap: 16 mmol/L (ref 10.0–18.0)
BUN/Creatinine Ratio: 21 (ref 12–28)
BUN: 35 mg/dL — ABNORMAL HIGH (ref 8–27)
Bilirubin Total: 0.2 mg/dL (ref 0.0–1.2)
CO2: 16 mmol/L — ABNORMAL LOW (ref 20–29)
Calcium: 9.4 mg/dL (ref 8.7–10.3)
Chloride: 107 mmol/L — ABNORMAL HIGH (ref 96–106)
Creatinine, Ser: 1.63 mg/dL — ABNORMAL HIGH (ref 0.57–1.00)
GFR calc Af Amer: 39 mL/min/{1.73_m2} — ABNORMAL LOW (ref >59)
GFR calc non Af Amer: 34 mL/min/{1.73_m2} — ABNORMAL LOW (ref >59)
Globulin, Total: 3.2 g/dL (ref 1.5–4.5)
Glucose: 142 mg/dL — ABNORMAL HIGH (ref 65–99)
Potassium: 4.7 mmol/L (ref 3.5–5.2)
Sodium: 139 mmol/L (ref 134–144)
Total Protein: 7.1 g/dL (ref 6.0–8.5)

## 2019-09-26 NOTE — Assessment & Plan Note (Signed)
Last a1c 6.6 03/2019. Today is 7.8. Current medications include jardiance 10 mg qd, victoza 1.8 mg qd, and metformin 500 mg xr bid. She has not had any difficult taking her medications and notes her glucose ranges around the 120s in the morning. She is interested in trulicity.   - stop victoza, start trulicity - continue victoza until she is able to get trulicity - continue jardiance, increased metformin to 1000 mg bid  - f/u three months for a1c check.  - BMP today  - continue atorvastatin 40 mg qd

## 2019-09-26 NOTE — Assessment & Plan Note (Signed)
Previously seen with symptoms of GERD on protonix prn and she was directed to take this for 8 weeks. Since then, she has noticed that if she ever missed two doses her heart burn comes back. She also has difficulty chewing her food and thinks this may be part of the problem. She has plans to follow-up with the dentist for further dental work. She is supposed to have colonoscopy but first required clearance by cardiology, which has been done.   - f/u with GI  - continue protonix daily

## 2019-09-26 NOTE — Assessment & Plan Note (Signed)
She has been having abdominal fullness recently. She has needed her lasix prn on occasion whenever she has shortness of breath or swelling, but this hasn't been often. She denies constipation, symptoms of reflux, nausea, abdominal pain. She states she has a history of uterine fibroids she was told during her last colonoscopy. She also has noticed her urine is slower when she sits and sometimes has incomplete emptying.  She is 10 lbs up from 8 months ago. She has had decreased ambulation due to chronic hip pain. On PE there is no swelling, JVD, or crackles. Bedside US did not show any increased fluid or ascites or enlargement of the bladder. With her A1c increased without difficulty taking her diabetes medications and no signs of volume overload I think she may be having bloating vs. General weight gain.   - take lasix for next four days then continue lasix 40 mg prn - obtain weight daily and record - take lasix with increase in weight, shortness of breath or swelling   - cmp today  - advised food journal

## 2019-09-26 NOTE — Assessment & Plan Note (Signed)
-   discussed recommendation for covid-19 vaccine and provided information   Due for screening colonoscopy, which required clearance with cardiology. This was approved 07/2019. It appears GI appointment was cancelled due to family emergency but she has rescheduled   - f/u with GI for colonoscopy - mammogram reordered

## 2019-09-26 NOTE — Assessment & Plan Note (Signed)
Current medications are losartan 100 mg qd, carvedilol 12.5 mg bid, imdur 120 mg long acting. She is taking lasix 40 mg prn if she becomes short of breath. She denies symptoms of shortness of breath, lower extremity swelling. She continues to feel bloated in the abdominal area. See abdominal fullness.   - take lasix 40 mg next four days then return to prn - bmp today  - record daily weights at home - continue coreg, imdur, and losartan

## 2019-09-26 NOTE — Assessment & Plan Note (Signed)
BP Readings from Last 3 Encounters:  09/25/19 (!) 162/75  07/05/19 (!) 144/76  04/23/19 (!) 142/70   She did not take her medications this morning but states she has not had any difficulty taking them otherwise and is able to take them daily. Current medications are losartan 100 mg qd, carvedilol 12.5 mg bid, imdur 120 mg long acting.   - continue current medications  - repeat bp at follow-up

## 2019-09-26 NOTE — Assessment & Plan Note (Signed)
Previously seen on recent pelvic exam. This is causing her some difficulty with urination and sometimes she is having to stand. Bladder not enlarged on bedside US.   - bmp today  - referral to urogyn

## 2019-10-01 ENCOUNTER — Other Ambulatory Visit: Payer: Self-pay | Admitting: Internal Medicine

## 2019-10-01 DIAGNOSIS — IMO0002 Reserved for concepts with insufficient information to code with codable children: Secondary | ICD-10-CM

## 2019-10-03 ENCOUNTER — Encounter: Payer: Medicaid Other | Admitting: Gastroenterology

## 2019-10-03 ENCOUNTER — Other Ambulatory Visit: Payer: Self-pay | Admitting: Internal Medicine

## 2019-10-03 DIAGNOSIS — IMO0002 Reserved for concepts with insufficient information to code with codable children: Secondary | ICD-10-CM

## 2019-10-03 DIAGNOSIS — K219 Gastro-esophageal reflux disease without esophagitis: Secondary | ICD-10-CM

## 2019-10-03 MED FILL — ATORVASTATIN 40 MG TABLET: 40 | 90 days supply | Qty: 90 | Fill #0

## 2019-10-03 NOTE — Telephone Encounter (Signed)
Hi Dr Sharon Seller,  Per your LOV note, pt was taking protonix, but I don't see this on her current med list.   Refill request for omeprazole. Thanks! Higinio Roger, RN,BSN

## 2019-10-08 ENCOUNTER — Other Ambulatory Visit: Payer: Self-pay | Admitting: Internal Medicine

## 2019-10-08 DIAGNOSIS — IMO0002 Reserved for concepts with insufficient information to code with codable children: Secondary | ICD-10-CM

## 2019-10-21 NOTE — Addendum Note (Signed)
Addended by: Hulan Fray on: 10/21/2019 05:23 PM   Modules accepted: Orders

## 2019-10-24 ENCOUNTER — Other Ambulatory Visit (HOSPITAL_COMMUNITY): Payer: Self-pay | Admitting: Internal Medicine

## 2019-10-24 ENCOUNTER — Other Ambulatory Visit: Payer: Self-pay | Admitting: Internal Medicine

## 2019-10-24 DIAGNOSIS — IMO0002 Reserved for concepts with insufficient information to code with codable children: Secondary | ICD-10-CM

## 2019-10-24 MED FILL — CARVEDILOL 12.5 MG TABLET: 12.5 | 30 days supply | Qty: 60 | Fill #1

## 2019-10-24 MED FILL — METFORMIN HCL ER 500 MG TB2: 500 | 30 days supply | Qty: 120 | Fill #0

## 2019-10-24 NOTE — Telephone Encounter (Signed)
Rx was written 5/26 but "No Print". Please re-send electronically. Thanks

## 2019-10-30 MED FILL — TRULICITY 0.75 MG/0.5 ML PE: 0.75 | 28 days supply | Qty: 2 | Fill #1

## 2019-11-05 ENCOUNTER — Ambulatory Visit: Payer: Medicaid Other

## 2019-11-08 ENCOUNTER — Other Ambulatory Visit: Payer: Self-pay | Admitting: Internal Medicine

## 2019-11-08 DIAGNOSIS — IMO0002 Reserved for concepts with insufficient information to code with codable children: Secondary | ICD-10-CM

## 2019-11-08 MED FILL — OMEPRAZOLE DR 20 MG CAPSULE: 20 | 30 days supply | Qty: 30 | Fill #1

## 2019-11-08 MED FILL — ACCU-CHEK GUIDE STRP: 33 days supply | Qty: 100 | Fill #0

## 2019-11-11 MED FILL — JARDIANCE 10 MG TABLET: 10 | 30 days supply | Qty: 30 | Fill #1

## 2019-11-11 MED FILL — ISOSORBIDE MN ER 120 MG TAB: 120 | 30 days supply | Qty: 30 | Fill #3

## 2019-11-18 ENCOUNTER — Other Ambulatory Visit: Payer: Self-pay | Admitting: Internal Medicine

## 2019-11-18 DIAGNOSIS — I5042 Chronic combined systolic (congestive) and diastolic (congestive) heart failure: Secondary | ICD-10-CM

## 2019-11-18 DIAGNOSIS — I10 Essential (primary) hypertension: Secondary | ICD-10-CM

## 2019-11-19 MED FILL — LOSARTAN POTASSIUM 100 MG T: 100 | 30 days supply | Qty: 30 | Fill #0

## 2019-12-04 MED FILL — CARVEDILOL 12.5 MG TABLET: 12.5 | 30 days supply | Qty: 60 | Fill #2

## 2019-12-04 MED FILL — TRULICITY 0.75 MG/0.5 ML PE: 0.75 | 28 days supply | Qty: 2 | Fill #2

## 2019-12-12 ENCOUNTER — Other Ambulatory Visit (HOSPITAL_COMMUNITY): Payer: Self-pay | Admitting: Internal Medicine

## 2019-12-12 ENCOUNTER — Other Ambulatory Visit: Payer: Self-pay | Admitting: Internal Medicine

## 2019-12-12 DIAGNOSIS — K219 Gastro-esophageal reflux disease without esophagitis: Secondary | ICD-10-CM

## 2019-12-12 MED FILL — JARDIANCE 10 MG TABLET: 10 | 30 days supply | Qty: 30 | Fill #2

## 2019-12-12 MED FILL — ISOSORBIDE MN ER 120 MG TAB: 120 | 30 days supply | Qty: 30 | Fill #4

## 2019-12-16 ENCOUNTER — Telehealth: Payer: Self-pay | Admitting: Internal Medicine

## 2019-12-16 MED FILL — OMEPRAZOLE DR 20 MG CAPSULE: 20 | 30 days supply | Qty: 30 | Fill #0

## 2019-12-16 NOTE — Telephone Encounter (Signed)
Returned call to [place] at [employer]. Gave name of today's Attending as Intern is not yet registered with [facility]. Hubbard Hartshorn, BSN, RN-BC

## 2019-12-16 NOTE — Telephone Encounter (Signed)
Rec'd call from [place] a call back about the providers information   omeprazole (PRILOSEC) 20 MG capsule  [facility] [place], [place] - [address].

## 2020-01-07 MED FILL — JARDIANCE 10 MG TABLET: 10 | 30 days supply | Qty: 30 | Fill #3

## 2020-01-07 MED FILL — LOSARTAN POTASSIUM 100 MG T: 100 | 30 days supply | Qty: 30 | Fill #1

## 2020-01-07 MED FILL — METFORMIN HCL ER 500 MG TB2: 500 | 30 days supply | Qty: 120 | Fill #1

## 2020-01-07 MED FILL — ISOSORBIDE MN ER 120 MG TAB: 120 | 30 days supply | Qty: 30 | Fill #5

## 2020-01-14 MED FILL — TRULICITY 0.75 MG/0.5 ML PE: 0.75 | 28 days supply | Qty: 2 | Fill #3

## 2020-01-14 MED FILL — OMEPRAZOLE DR 20 MG CAPSULE: 20 | 30 days supply | Qty: 30 | Fill #1
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
## 2020-01-18 ENCOUNTER — Other Ambulatory Visit: Payer: Self-pay

## 2020-01-18 ENCOUNTER — Encounter (HOSPITAL_COMMUNITY): Payer: Self-pay | Admitting: Emergency Medicine

## 2020-01-18 ENCOUNTER — Ambulatory Visit (HOSPITAL_COMMUNITY)
Admission: EM | Admit: 2020-01-18 | Discharge: 2020-01-18 | Disposition: A | Discharge status: Home / Self Care | Payer: Medicaid Other | Attending: Family Medicine | Admitting: Family Medicine

## 2020-01-18 DIAGNOSIS — I10 Essential (primary) hypertension: Secondary | ICD-10-CM

## 2020-01-18 DIAGNOSIS — M7918 Myalgia, other site: Secondary | ICD-10-CM

## 2020-01-18 DIAGNOSIS — M542 Cervicalgia: Secondary | ICD-10-CM

## 2020-01-18 MED ORDER — TRAMADOL HCL 50 MG PO TABS: 50.0000 mg | ORAL_TABLET | Freq: Two times a day (BID) | ORAL | 0 refills | Status: DC | PRN

## 2020-01-18 MED ORDER — TRAMADOL HCL 50 MG PO TABS: 50.0000 mg | ORAL_TABLET | Freq: Two times a day (BID) | ORAL | 0 refills | Status: AC | PRN

## 2020-01-18 MED ORDER — TIZANIDINE HCL 4 MG PO TABS: 4.0000 mg | ORAL_TABLET | Freq: Four times a day (QID) | ORAL | 0 refills | Status: DC | PRN
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
## 2020-01-22 ENCOUNTER — Encounter: Payer: Self-pay | Admitting: Cardiology

## 2020-01-22 ENCOUNTER — Other Ambulatory Visit: Payer: Self-pay

## 2020-01-22 ENCOUNTER — Telehealth (INDEPENDENT_AMBULATORY_CARE_PROVIDER_SITE_OTHER): Payer: Medicaid Other | Admitting: Cardiology

## 2020-01-22 VITALS — BP 176/99 | HR 66 | Ht 63.0 in | Wt 177.0 lb

## 2020-01-22 DIAGNOSIS — I447 Left bundle-branch block, unspecified: Secondary | ICD-10-CM

## 2020-01-22 DIAGNOSIS — IMO0002 Reserved for concepts with insufficient information to code with codable children: Secondary | ICD-10-CM

## 2020-01-22 DIAGNOSIS — E1151 Type 2 diabetes mellitus with diabetic peripheral angiopathy without gangrene: Secondary | ICD-10-CM

## 2020-01-22 DIAGNOSIS — I5042 Chronic combined systolic (congestive) and diastolic (congestive) heart failure: Secondary | ICD-10-CM | POA: Diagnosis not present

## 2020-01-22 DIAGNOSIS — E785 Hyperlipidemia, unspecified: Secondary | ICD-10-CM | POA: Diagnosis not present

## 2020-01-22 DIAGNOSIS — E1165 Type 2 diabetes mellitus with hyperglycemia: Secondary | ICD-10-CM

## 2020-01-22 DIAGNOSIS — I2510 Atherosclerotic heart disease of native coronary artery without angina pectoris: Secondary | ICD-10-CM

## 2020-01-22 DIAGNOSIS — I10 Essential (primary) hypertension: Secondary | ICD-10-CM | POA: Diagnosis not present

## 2020-01-22 DIAGNOSIS — N1831 Chronic kidney disease, stage 3a: Secondary | ICD-10-CM | POA: Diagnosis not present

## 2020-01-22 MED ORDER — AMLODIPINE BESYLATE 5 MG PO TABS: 5.0000 mg | ORAL_TABLET | Freq: Every day | ORAL | 3 refills | Status: DC

## 2020-01-22 MED FILL — AMLODIPINE BESYLATE 5 MG TA: 5 | 90 days supply | Qty: 90 | Fill #0

## 2020-01-22 MED FILL — ATORVASTATIN 40 MG TABLET: 40 | 90 days supply | Qty: 90 | Fill #1

## 2020-01-22 NOTE — Patient Instructions (Addendum)
Medication Instructions:  Your physician has recommended you make the following change in your medication:  -- START AMLODIPINE 5 mg - Take 1 tablet (5 mg) by mouth daily -- NEW RX SENT --  *If you need a refill on your cardiac medications before your next appointment, please call your pharmacy*  Follow-Up: At [facility], you and your health needs are our priority.  As part of our continuing mission to provide you with exceptional heart care, we have created designated Provider Care Teams.  These Care Teams include your primary Cardiologist (physician) and Advanced Practice Providers (APPs -  Physician Assistants and Nurse Practitioners) who all work together to provide you with the care you need, when you need it.  We recommend signing up for the patient portal called "MyChart".  Sign up information is provided on this After Visit Summary.  MyChart is used to connect with patients for Virtual Visits (Telemedicine).  Patients are able to view lab/test results, encounter notes, upcoming appointments, etc.  Non-urgent messages can be sent to your provider as well.   To learn more about what you can do with MyChart, go to [email or web address].    Your next appointment:   Your physician recommends that you schedule a follow-up appointment in: 6 MONTHS with Dr. Tamala Julian -- Thursday 07/09/20 at 9:00 AM  The format for your next appointment:   In Person with Daneen Schick, MD  -- Please check your Blood Pressure daily for the next 10 days and record the readings. Call the office and report the readings to Dr. Darliss Ridgel Nurse Anderson [place] B.). If any recommendations need to be made someone from our office will contact you. --

## 2020-01-27 ENCOUNTER — Encounter: Payer: Self-pay | Admitting: Internal Medicine

## 2020-01-30 ENCOUNTER — Other Ambulatory Visit: Payer: Self-pay | Admitting: Internal Medicine

## 2020-01-30 DIAGNOSIS — I5042 Chronic combined systolic (congestive) and diastolic (congestive) heart failure: Secondary | ICD-10-CM

## 2020-02-04 ENCOUNTER — Other Ambulatory Visit (HOSPITAL_COMMUNITY): Payer: Self-pay | Admitting: Student

## 2020-02-04 MED FILL — CARVEDILOL 12.5 MG TABLET: 12.5 | 30 days supply | Qty: 60 | Fill #0

## 2020-02-05 ENCOUNTER — Other Ambulatory Visit: Payer: Self-pay | Admitting: Internal Medicine

## 2020-02-05 ENCOUNTER — Encounter: Payer: Medicaid Other | Admitting: Student

## 2020-02-05 NOTE — Telephone Encounter (Signed)
RX was sent via [facility] yesterday with return receipt confirmation noted. SChaplin, RN,BSN

## 2020-02-05 NOTE — Telephone Encounter (Signed)
Need refill on carvedilol (COREG) 12.5 MG tablet  ;pt contact [place], [place].

## 2020-02-10 ENCOUNTER — Encounter: Payer: Medicaid Other | Admitting: Student

## 2020-02-17 ENCOUNTER — Ambulatory Visit (INDEPENDENT_AMBULATORY_CARE_PROVIDER_SITE_OTHER): Payer: Medicaid Other | Admitting: Student

## 2020-02-17 ENCOUNTER — Encounter: Payer: Self-pay | Admitting: Student

## 2020-02-17 ENCOUNTER — Other Ambulatory Visit (HOSPITAL_COMMUNITY): Payer: Self-pay | Admitting: Student

## 2020-02-17 VITALS — BP 135/64 | HR 78 | Temp 98.0°F | Ht 63.0 in | Wt 180.1 lb

## 2020-02-17 DIAGNOSIS — E1165 Type 2 diabetes mellitus with hyperglycemia: Secondary | ICD-10-CM

## 2020-02-17 DIAGNOSIS — I10 Essential (primary) hypertension: Secondary | ICD-10-CM | POA: Diagnosis not present

## 2020-02-17 DIAGNOSIS — I5042 Chronic combined systolic (congestive) and diastolic (congestive) heart failure: Secondary | ICD-10-CM | POA: Diagnosis not present

## 2020-02-17 DIAGNOSIS — E1122 Type 2 diabetes mellitus with diabetic chronic kidney disease: Secondary | ICD-10-CM

## 2020-02-17 DIAGNOSIS — N812 Incomplete uterovaginal prolapse: Secondary | ICD-10-CM | POA: Diagnosis not present

## 2020-02-17 DIAGNOSIS — M1611 Unilateral primary osteoarthritis, right hip: Secondary | ICD-10-CM | POA: Diagnosis not present

## 2020-02-17 DIAGNOSIS — Z794 Long term (current) use of insulin: Secondary | ICD-10-CM

## 2020-02-17 DIAGNOSIS — M542 Cervicalgia: Secondary | ICD-10-CM

## 2020-02-17 DIAGNOSIS — E1151 Type 2 diabetes mellitus with diabetic peripheral angiopathy without gangrene: Secondary | ICD-10-CM

## 2020-02-17 DIAGNOSIS — IMO0002 Reserved for concepts with insufficient information to code with codable children: Secondary | ICD-10-CM

## 2020-02-17 DIAGNOSIS — N183 Chronic kidney disease, stage 3 unspecified: Secondary | ICD-10-CM

## 2020-02-17 LAB — POCT GLYCOSYLATED HEMOGLOBIN (HGB A1C): Hemoglobin A1C: 8.9 % — AB (ref 4.0–5.6)

## 2020-02-17 LAB — GLUCOSE, CAPILLARY: Glucose-Capillary: 200 mg/dL — ABNORMAL HIGH (ref 70–99)

## 2020-02-17 MED ORDER — TIZANIDINE HCL 4 MG PO TABS: 4.0000 mg | ORAL_TABLET | Freq: Four times a day (QID) | ORAL | 0 refills | Status: DC | PRN

## 2020-02-17 MED ORDER — EMPAGLIFLOZIN 25 MG PO TABS: 25.0000 mg | ORAL_TABLET | Freq: Every day | ORAL | 2 refills | Status: DC

## 2020-02-17 MED ORDER — LOSARTAN POTASSIUM 100 MG PO TABS: 100.0000 mg | ORAL_TABLET | Freq: Every day | ORAL | 3 refills | Status: DC

## 2020-02-17 MED FILL — JARDIANCE 25 MG TABLET: 25 | 60 days supply | Qty: 60 | Fill #0

## 2020-02-17 MED FILL — tiZANidine HCL 4 MG TABS: 4 | 7 days supply | Qty: 30 | Fill #0

## 2020-02-17 MED FILL — LOSARTAN POTASSIUM 100 MG T: 100 | 30 days supply | Qty: 30 | Fill #0

## 2020-02-17 NOTE — Progress Notes (Addendum)
   CC: Medication refill  HPI:  [name] is a 64 y.o. female with a PMH of HTN, CAD, combined systolic and diastolic heart failure, diabetes, HLD and CKD stage III presents to clinic for med refill and follow-up of chronic medical problems.  Please see problem based charting for evaluation, assessment and plan.  Past Medical History:  Diagnosis Date  . Arthritis    "hands, shoulders, hips, legs" (05/10/2018)  . Arthritis of left hip 08/18/2013   S/p total hip arthroplasty.    . CHF (congestive heart failure) ([place])    2014...@ [facility]  . Chronic lower back pain   . COPD (chronic obstructive pulmonary disease) ([place])   . Healthcare maintenance 06/16/2015  . High cholesterol   . Hypertension   . Influenza A 06/01/2017  . Influenza, pneumonia 06/01/2017  . Left flank pain 10/11/2016  . Myocardial infarction [facility])    "been told that I've had one; don't know when it would have been" (05/10/2018)  . Problem with sexual relationship 10/13/2017  . PVD (peripheral vascular disease) ([place])   . Shortness of breath 03/12/2019  . Type II diabetes mellitus ([place])    diagnosed 2001   Review of Systems: Endorse some mild neck pain on rotation of the neck and difficulty walking due to hip pain but denies any recent trauma, headaches, dizziness, chest pain or shortness of breath.  Physical Exam:  General: Pleasant elderly woman sitting in a wheelchair. No acute distress. Well nourished, well developed. Head: Normocephalic, atraumatic Cardiac: RRR. No murmurs, rubs or gallops. S1, S2. No lower extremities edema Respiratory: Lungs CTAB. No wheezing or crackles. No increased WOB Abdominal: Soft, symmetric and non tender. No organomegaly. Normal bowel sounds Skin: Warm, dry and intact without rashes or lesions. MSK: Mild tenderness to palpation of cervical spine. Pain on left and right rotation of the neck. Extremities: Atraumatic. Full ROM. Pulse palpable. Neuro: A&O x 3. Moves all extremities.    Psych: Appropriate mood and affect. Normal judgement  Vitals:   02/17/20 1524  BP: 135/64  Pulse: 78  Temp: 98 F (36.7 C)  TempSrc: Oral  SpO2: 98%  Weight: 180 lb 1.6 oz (81.7 kg)  Height: 5\' 3"  (1.6 m)    Assessment & Plan:   See Encounters Tab for problem based charting.  Patient seen with Dr. Michelle Nasuti, MD, MPH

## 2020-02-17 NOTE — Assessment & Plan Note (Signed)
Patient states she has an appointment coming up.  Will continue to monitor.

## 2020-02-17 NOTE — Patient Instructions (Addendum)
Thank you, [name] for allowing [place] to provide your care today. Today we discussed your neck pain, your hip pain and [name].  For your neck pain, continue to do the exercises to keep you loose and take the muscle relaxer as needed for stiffness.  For your hip pain, we recommend following up with your orthopedic doctor to have it replaced.  If you need pain medicine, please schedule an appointment with your PCP [name].  For [name] your A1c has increased so we will increase your Jardiance dose.  Continue to decrease carbs and sugars in your diet.  I have ordered the following labs for you:   Lab Orders     POC Hbg A1C   I will call if any are abnormal. All of your labs can be accessed through "My Chart".  I have ordered the following medication/changed the following medications:  1. Increase Jardiance from 10 mg to 25 mg daily 2. Refilled losartan 3. Take Zanaflex 4 mg every 6 hours as needed for neck stiffness  Please follow-up in 3 months or as needed  Should you have any questions or concerns please call the internal medicine clinic at [phone].    Alyssa Dibbles, MD, MPH [facility]  My Chart Access: [email or web address]   If you have not already done so, please get your COVID 19 vaccine  To schedule an appointment for a COVID vaccine choice any of the following: Go to [email or web address]   Go to [email or web address]                  Call [phone]                                     Call [phone] and select Option 2

## 2020-02-17 NOTE — Assessment & Plan Note (Signed)
No complaints today. Denies shortness of breath. Compliance with medication.  Saw cardiologist last month. Continue to follow-up with cardiology.

## 2020-02-17 NOTE — Assessment & Plan Note (Addendum)
BP stable at 135/64.  Amlodipine was added to patient's antihypertensive regimen last month.  BP currently well controlled.  Plan: --Continue amlodipine 5 mg daily --Continue carvedilol 12.5 mg twice daily --Continue losartan 100 mg daily
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
## 2020-02-17 NOTE — Assessment & Plan Note (Signed)
Last A1c was 7.8 four months ago. A1c has increased to 8.9 today. Patient states she has been compliant with her Metformin, Trulicity and Jardiance. States her blood sugar ranges around 140-180 at home. CBG was 200 today.  In the setting of patient's history of heart failure and diabetes, will optimize dose of Jardiance for improved cardiovascular outcome.   Plan: --Increase Jardiance 10 mg daily to 25 mg daily --Continue Trulicity 0.5 mg weekly --Continue Metformin 1000 mg twice daily --Follow-up in 3 months for A1c check --Follow-up BMP from urology

## 2020-02-17 NOTE — Assessment & Plan Note (Addendum)
Continues to have left hip pain due to hip arthritis. Patient states her left hip was replaced 4.5 years ago which relieved the pain on that side however she continues to have pain on her right hip, which has limits her mobility and function.  Pelvis x-ray 3 years ago showed advanced hip osteoarthrosis. States her cardiologist has cleared her for hip surgery and she plans to call her orthopedic surgeon to schedule appointment.  Patient states she knows she needs the surgery but she is not mentally there. Patient is worried about complications of having hip replacement at her age. She wants to be able to play with her granddaughter without needing to be in a wheelchair.  Patient was informed of improved outcomes of hip replacement even for patients at her age. Patient was also informed to make an appointment with her PCP if she wants chronic pain meds.  Plan: --Continue Voltaren gel --Tylenol as needed --Follow-up with orthopedic surgeon for further evaluation --Follow-up office visit as needed with PCP if chronic pain medicine needed to control hip pain.

## 2020-02-20 NOTE — Progress Notes (Signed)
Internal Medicine Clinic Attending  I saw and evaluated the patient.  I personally confirmed the key portions of the history and exam documented by Dr. Amponsah and I reviewed pertinent patient test results.  The assessment, diagnosis, and plan were formulated together and I agree with the documentation in the resident's note.  

## 2020-02-24 DIAGNOSIS — N3946 Mixed incontinence: Secondary | ICD-10-CM | POA: Diagnosis not present

## 2020-02-24 DIAGNOSIS — N8110 Cystocele, unspecified: Secondary | ICD-10-CM | POA: Diagnosis not present

## 2020-02-27 DIAGNOSIS — N8110 Cystocele, unspecified: Secondary | ICD-10-CM | POA: Diagnosis not present

## 2020-02-27 DIAGNOSIS — N3946 Mixed incontinence: Secondary | ICD-10-CM | POA: Diagnosis not present

## 2020-03-04 MED FILL — TRULICITY 0.75 MG/0.5 ML PE: 0.75 | 28 days supply | Qty: 2 | Fill #4

## 2020-03-06 ENCOUNTER — Encounter: Payer: Self-pay | Admitting: Student

## 2020-03-20 MED FILL — ISOSORBIDE MN ER 120 MG TAB: 120 | 30 days supply | Qty: 30 | Fill #6

## 2020-04-01 MED FILL — TRULICITY 0.75 MG/0.5 ML PE: 0.75 | 28 days supply | Qty: 2 | Fill #5

## 2020-04-01 MED FILL — CARVEDILOL 12.5 MG TABLET: 12.5 | 30 days supply | Qty: 60 | Fill #1

## 2020-04-01 MED FILL — LOSARTAN POTASSIUM 100 MG T: 100 | 30 days supply | Qty: 30 | Fill #1

## 2020-04-12 ENCOUNTER — Other Ambulatory Visit: Payer: Self-pay

## 2020-04-12 ENCOUNTER — Encounter (HOSPITAL_BASED_OUTPATIENT_CLINIC_OR_DEPARTMENT_OTHER): Payer: Self-pay

## 2020-04-12 ENCOUNTER — Inpatient Hospital Stay (HOSPITAL_BASED_OUTPATIENT_CLINIC_OR_DEPARTMENT_OTHER)
Admission: EM | Admit: 2020-04-12 | Discharge: 2020-04-20 | DRG: 177 | Disposition: A | Discharge status: Home / Self Care | Payer: Medicaid Other | Attending: Internal Medicine | Admitting: Internal Medicine

## 2020-04-12 ENCOUNTER — Emergency Department (HOSPITAL_BASED_OUTPATIENT_CLINIC_OR_DEPARTMENT_OTHER): Payer: Medicaid Other

## 2020-04-12 DIAGNOSIS — Z79899 Other long term (current) drug therapy: Secondary | ICD-10-CM

## 2020-04-12 DIAGNOSIS — G8929 Other chronic pain: Secondary | ICD-10-CM | POA: Diagnosis present

## 2020-04-12 DIAGNOSIS — Z6830 Body mass index (BMI) 30.0-30.9, adult: Secondary | ICD-10-CM

## 2020-04-12 DIAGNOSIS — E669 Obesity, unspecified: Secondary | ICD-10-CM | POA: Diagnosis present

## 2020-04-12 DIAGNOSIS — J90 Pleural effusion, not elsewhere classified: Secondary | ICD-10-CM | POA: Diagnosis not present

## 2020-04-12 DIAGNOSIS — R69 Illness, unspecified: Secondary | ICD-10-CM | POA: Diagnosis not present

## 2020-04-12 DIAGNOSIS — U071 COVID-19: Principal | ICD-10-CM | POA: Diagnosis present

## 2020-04-12 DIAGNOSIS — R0902 Hypoxemia: Secondary | ICD-10-CM

## 2020-04-12 DIAGNOSIS — R197 Diarrhea, unspecified: Secondary | ICD-10-CM | POA: Diagnosis present

## 2020-04-12 DIAGNOSIS — N1832 Chronic kidney disease, stage 3b: Secondary | ICD-10-CM | POA: Diagnosis present

## 2020-04-12 DIAGNOSIS — J189 Pneumonia, unspecified organism: Secondary | ICD-10-CM | POA: Diagnosis not present

## 2020-04-12 DIAGNOSIS — E1165 Type 2 diabetes mellitus with hyperglycemia: Secondary | ICD-10-CM | POA: Diagnosis present

## 2020-04-12 DIAGNOSIS — J9601 Acute respiratory failure with hypoxia: Secondary | ICD-10-CM | POA: Diagnosis present

## 2020-04-12 DIAGNOSIS — Z96642 Presence of left artificial hip joint: Secondary | ICD-10-CM | POA: Diagnosis present

## 2020-04-12 DIAGNOSIS — R112 Nausea with vomiting, unspecified: Secondary | ICD-10-CM | POA: Diagnosis present

## 2020-04-12 DIAGNOSIS — I252 Old myocardial infarction: Secondary | ICD-10-CM

## 2020-04-12 DIAGNOSIS — E1151 Type 2 diabetes mellitus with diabetic peripheral angiopathy without gangrene: Secondary | ICD-10-CM | POA: Diagnosis present

## 2020-04-12 DIAGNOSIS — J1282 Pneumonia due to coronavirus disease 2019: Secondary | ICD-10-CM | POA: Diagnosis present

## 2020-04-12 DIAGNOSIS — R059 Cough, unspecified: Secondary | ICD-10-CM | POA: Diagnosis not present

## 2020-04-12 DIAGNOSIS — N183 Chronic kidney disease, stage 3 unspecified: Secondary | ICD-10-CM | POA: Diagnosis present

## 2020-04-12 DIAGNOSIS — Z888 Allergy status to other drugs, medicaments and biological substances status: Secondary | ICD-10-CM

## 2020-04-12 DIAGNOSIS — I951 Orthostatic hypotension: Secondary | ICD-10-CM | POA: Diagnosis present

## 2020-04-12 DIAGNOSIS — R5381 Other malaise: Secondary | ICD-10-CM | POA: Diagnosis not present

## 2020-04-12 DIAGNOSIS — E7800 Pure hypercholesterolemia, unspecified: Secondary | ICD-10-CM | POA: Diagnosis present

## 2020-04-12 DIAGNOSIS — Z833 Family history of diabetes mellitus: Secondary | ICD-10-CM

## 2020-04-12 DIAGNOSIS — N179 Acute kidney failure, unspecified: Secondary | ICD-10-CM | POA: Diagnosis present

## 2020-04-12 DIAGNOSIS — E1122 Type 2 diabetes mellitus with diabetic chronic kidney disease: Secondary | ICD-10-CM | POA: Diagnosis present

## 2020-04-12 DIAGNOSIS — J440 Chronic obstructive pulmonary disease with acute lower respiratory infection: Secondary | ICD-10-CM | POA: Diagnosis present

## 2020-04-12 DIAGNOSIS — I5042 Chronic combined systolic (congestive) and diastolic (congestive) heart failure: Secondary | ICD-10-CM | POA: Diagnosis present

## 2020-04-12 DIAGNOSIS — Z7982 Long term (current) use of aspirin: Secondary | ICD-10-CM

## 2020-04-12 DIAGNOSIS — K219 Gastro-esophageal reflux disease without esophagitis: Secondary | ICD-10-CM | POA: Diagnosis present

## 2020-04-12 DIAGNOSIS — E113299 Type 2 diabetes mellitus with mild nonproliferative diabetic retinopathy without macular edema, unspecified eye: Secondary | ICD-10-CM

## 2020-04-12 DIAGNOSIS — Z7984 Long term (current) use of oral hypoglycemic drugs: Secondary | ICD-10-CM

## 2020-04-12 DIAGNOSIS — N159 Renal tubulo-interstitial disease, unspecified: Secondary | ICD-10-CM | POA: Diagnosis present

## 2020-04-12 DIAGNOSIS — I130 Hypertensive heart and chronic kidney disease with heart failure and stage 1 through stage 4 chronic kidney disease, or unspecified chronic kidney disease: Secondary | ICD-10-CM | POA: Diagnosis present

## 2020-04-12 DIAGNOSIS — M545 Low back pain, unspecified: Secondary | ICD-10-CM | POA: Diagnosis present

## 2020-04-12 DIAGNOSIS — Z87891 Personal history of nicotine dependence: Secondary | ICD-10-CM

## 2020-04-12 LAB — CBC WITH DIFFERENTIAL/PLATELET
Abs Immature Granulocytes: 0 10*3/uL (ref 0.00–0.07)
Basophils Absolute: 0 10*3/uL (ref 0.0–0.1)
Basophils Relative: 0 %
Blasts: 4 %
Eosinophils Absolute: 0 10*3/uL (ref 0.0–0.5)
Eosinophils Relative: 0 %
HCT: 47.5 % — ABNORMAL HIGH (ref 36.0–46.0)
Hemoglobin: 15.5 g/dL — ABNORMAL HIGH (ref 12.0–15.0)
Lymphocytes Relative: 21 %
Lymphs Abs: 0.9 10*3/uL (ref 0.7–4.0)
MCH: 28.9 pg (ref 26.0–34.0)
MCHC: 32.6 g/dL (ref 30.0–36.0)
MCV: 88.6 fL (ref 80.0–100.0)
Monocytes Absolute: 0.4 10*3/uL (ref 0.1–1.0)
Monocytes Relative: 9 %
Neutro Abs: 3 10*3/uL (ref 1.7–7.7)
Neutrophils Relative %: 66 %
Platelets: 170 10*3/uL (ref 150–400)
RBC: 5.36 MIL/uL — ABNORMAL HIGH (ref 3.87–5.11)
RDW: 14.3 % (ref 11.5–15.5)
WBC Morphology: ABNORMAL
WBC: 4.5 10*3/uL (ref 4.0–10.5)
nRBC: 0 % (ref 0.0–0.2)

## 2020-04-12 LAB — COMPREHENSIVE METABOLIC PANEL
ALT: 17 U/L (ref 0–44)
AST: 24 U/L (ref 15–41)
Albumin: 3.1 g/dL — ABNORMAL LOW (ref 3.5–5.0)
Alkaline Phosphatase: 86 U/L (ref 38–126)
Anion gap: 15 (ref 5–15)
BUN: 43 mg/dL — ABNORMAL HIGH (ref 8–23)
CO2: 15 mmol/L — ABNORMAL LOW (ref 22–32)
Calcium: 8.3 mg/dL — ABNORMAL LOW (ref 8.9–10.3)
Chloride: 101 mmol/L (ref 98–111)
Creatinine, Ser: 2.4 mg/dL — ABNORMAL HIGH (ref 0.44–1.00)
GFR, Estimated: 22 mL/min — ABNORMAL LOW (ref >60)
Glucose, Bld: 380 mg/dL — ABNORMAL HIGH (ref 70–99)
Potassium: 5.4 mmol/L — ABNORMAL HIGH (ref 3.5–5.1)
Sodium: 131 mmol/L — ABNORMAL LOW (ref 135–145)
Total Bilirubin: 0.7 mg/dL (ref 0.3–1.2)
Total Protein: 7.3 g/dL (ref 6.5–8.1)

## 2020-04-12 LAB — D-DIMER, QUANTITATIVE: D-Dimer, Quant: 1.8 ug/mL-FEU — ABNORMAL HIGH (ref 0.00–0.50)

## 2020-04-12 LAB — RESP PANEL BY RT-PCR (FLU A&B, COVID) ARPGX2
Influenza A by PCR: NEGATIVE
Influenza B by PCR: NEGATIVE
SARS Coronavirus 2 by RT PCR: POSITIVE — AB

## 2020-04-12 LAB — CBG MONITORING, ED: Glucose-Capillary: 362 mg/dL — ABNORMAL HIGH (ref 70–99)

## 2020-04-12 LAB — LACTIC ACID, PLASMA
Lactic Acid, Venous: 1.9 mmol/L (ref 0.5–1.9)
Lactic Acid, Venous: 1.9 mmol/L (ref 0.5–1.9)
Lactic Acid, Venous: 1.2 mmol/L (ref 0.5–1.9)

## 2020-04-12 MED ORDER — SODIUM CHLORIDE 0.9% FLUSH
3.0000 mL | INTRAVENOUS | Status: DC | PRN
  Filled 2020-04-12: qty 3

## 2020-04-12 MED ORDER — DEXAMETHASONE SODIUM PHOSPHATE 10 MG/ML IJ SOLN
6.0000 mg | Freq: Once | INTRAMUSCULAR | Status: AC
  Administered 2020-04-12: 21:00:00 6 mg via INTRAVENOUS
  Filled 2020-04-12: qty 1

## 2020-04-12 MED ORDER — SODIUM CHLORIDE 0.9 % IV SOLN
100.0000 mg | Freq: Every day | INTRAVENOUS | Status: AC
  Administered 2020-04-12 – 2020-04-15 (×4): 100 mg via INTRAVENOUS
  Filled 2020-04-12 (×6): qty 20

## 2020-04-12 MED ORDER — SODIUM CHLORIDE 0.9% FLUSH
3.0000 mL | Freq: Two times a day (BID) | INTRAVENOUS | Status: DC
  Administered 2020-04-13 – 2020-04-19 (×12): 3 mL via INTRAVENOUS
  Filled 2020-04-12: qty 3

## 2020-04-12 MED ORDER — SODIUM CHLORIDE 0.9 % IV SOLN
200.0000 mg | Freq: Once | INTRAVENOUS | Status: AC
  Administered 2020-04-12: 22:00:00 200 mg via INTRAVENOUS
  Filled 2020-04-12: qty 40

## 2020-04-12 MED ORDER — SODIUM CHLORIDE 0.9 % IV SOLN
250.0000 mL | INTRAVENOUS | Status: DC | PRN
Start: 2020-04-12 — End: 2020-04-20

## 2020-04-12 NOTE — ED Provider Notes (Signed)
[place] [facility] EMERGENCY DEPARTMENT Provider Note   CSN: [id number] Arrival date & time: 04/12/20  1718     History Chief Complaint  Patient presents with  . Cough  . Back Pain  . Vomiting    [name] is a 64 y.o. female.  Patient states she has had 3 weeks of symptoms that could be consistent with Covid infection. She has been having a cough with a little bit of productive cough she has had some mild generalized weakness and some body aches vomiting intermittently with some mild abdominal pain. Patient has not been vaccinated for Covid or flu. His husband is also sick and was taken by EMS today with low oxygen level. Patient arrived by EMS        Past Medical History:  Diagnosis Date  . Anemia 07/17/2013  . Arthritis    "hands, shoulders, hips, legs" (05/10/2018)  . Arthritis of left hip 08/18/2013   S/p total hip arthroplasty.    . CHF (congestive heart failure) ([place])    2014...@ [facility]  . Chronic lower back pain   . CKD (chronic kidney disease) stage 3, GFR 30-59 ml/min (HCC)   . COPD (chronic obstructive pulmonary disease) ([place])   . Healthcare maintenance 06/16/2015  . High cholesterol   . Hypertension   . Influenza A 06/01/2017  . Influenza, pneumonia 06/01/2017  . Left flank pain 10/11/2016  . Myocardial infarction [facility])    "been told that I've had one; don't know when it would have been" (05/10/2018)  . Problem with sexual relationship 10/13/2017  . PVD (peripheral vascular disease) ([place])   . Shortness of breath 03/12/2019  . Type II diabetes mellitus ([place])    diagnosed 2001    Patient Active Problem List   Diagnosis Date Noted  . Musculoskeletal neck pain 02/17/2020  . Abdominal fullness 09/26/2019  . Incomplete uterine prolapse 11/23/2018  . GERD (gastroesophageal reflux disease) 11/14/2018  . Mild depression ([place]) 04/11/2018  . Nonischemic cardiomyopathy ([place]) 08/31/2017  . Pain in toes of both feet 01/26/2017  . Bilateral carotid artery disease  ([place]) 11/10/2016  . Seasonal allergies 06/30/2016  . Health care maintenance 06/16/2015  . Nonproliferative diabetic retinopathy associated with type 2 diabetes mellitus ([place]) 06/15/2015  . Osteoarthritis of right hip 06/09/2015  . Type II diabetes mellitus with stage 3 chronic kidney disease ([place]) 07/17/2013  . Chronic combined systolic and diastolic CHF, NYHA class 2 ([place]) 07/07/2013  . CKD (chronic kidney disease) stage 3, GFR 30-59 ml/min (HCC) 11/22/2012  . HTN (hypertension) 11/08/2012    Past Surgical History:  Procedure Laterality Date  . ABDOMINAL ANGIOGRAM  12/28/2012  . ABDOMINAL ANGIOGRAM N/A 12/29/2011   Procedure: ABDOMINAL ANGIOGRAM;  Surgeon: Conrad Luis Llorens Torres, MD;  Location: [facility] CATH LAB;  Service: Cardiovascular;  Laterality: N/A;  . ABDOMINAL AORTAGRAM N/A 12/21/2012   Procedure: ABDOMINAL Maxcine Ham;  Surgeon: Elam Dutch, MD;  Location: [facility] CATH LAB;  Service: Cardiovascular;  Laterality: N/A;  . EYE SURGERY Bilateral ~ 2018   "laser; related to my diabetes"  . JOINT REPLACEMENT    . PERIPHERAL VASCULAR CATHETERIZATION     "had to have some veins unclogged cause I was in pain when I walked" (05/10/2018)  . RIGHT/LEFT HEART CATH AND CORONARY ANGIOGRAPHY N/A 04/30/2018   Procedure: RIGHT/LEFT HEART CATH AND CORONARY ANGIOGRAPHY;  Surgeon: [name], MD;  Location: [facility] CV LAB;  Service: Cardiovascular;  Laterality: N/A;  . TOTAL HIP ARTHROPLASTY Left 08/19/2013   Procedure:  TOTAL HIP ARTHROPLASTY;  Surgeon: Kerin Salen, MD;  Location: [facility];  Service: Orthopedics;  Laterality: Left;  . TUBAL LIGATION       OB History   No obstetric history on file.     Family History  Problem Relation Age of Onset  . Diabetes Mother     Social History   Tobacco Use  . Smoking status: Former Smoker    Years: 7.00    Types: Cigarettes    Quit date: 05/08/1987    Years since quitting: 32.9  . Smokeless tobacco: Never Used  . Tobacco comment: 05/10/2018 "smoked when I  drank"  Vaping Use  . Vaping Use: Never used  Substance Use Topics  . Alcohol use: Not Currently    Alcohol/week: 0.0 standard drinks    Comment: 05/10/2018 "glass of wine on holidays"  . Drug use: Not Currently    Home Medications Prior to Admission medications   Medication Sig Start Date End Date Taking? Authorizing Provider  Accu-Chek FastClix Lancets MISC Check blood sugar up to  3x/day before meals 09/05/18   Seawell, Jaimie A, DO  ACCU-CHEK GUIDE test strip CHECK BLOOD SUGAR UP TO 3X/DAY BEFORE MEALS 11/08/19   Marianna Payment, MD  albuterol (PROVENTIL HFA) 108 (90 Base) MCG/ACT inhaler Inhale 2 puffs into the lungs every 6 (six) hours as needed for wheezing or shortness of breath. 03/12/19   Maudie Mercury, MD  amLODipine (NORVASC) 5 MG tablet Take 1 tablet (5 mg total) by mouth daily. 01/22/20   Kathyrn Drown D, NP  aspirin EC 81 MG tablet Take 1 tablet (81 mg total) by mouth daily. IM program, [facility] 04/22/16   Norman Herrlich, MD  atorvastatin (LIPITOR) 40 MG tablet TAKE 1 TABLET (40 MG TOTAL) BY MOUTH DAILY. 09/29/19   Seawell, Jaimie A, DO  blood glucose meter kit and supplies KIT Dispense based on patient and insurance preference. Use up to four times daily as directed. (FOR ICD-9 250.00, 250.01). 02/28/18   Kathi Ludwig, MD  Blood Glucose Monitoring Suppl (ACCU-CHEK GUIDE) w/Device KIT 1 each by Does not apply route 3 (three) times daily. 09/05/18   Seawell, Jaimie A, DO  carvedilol (COREG) 12.5 MG tablet TAKE 1 TABLET (12.5 MG TOTAL) BY MOUTH 2 (TWO) TIMES DAILY WITH A MEAL. 02/04/20   Lacinda Axon, MD  Cholecalciferol (VITAMIN D3) 125 MCG (5000 UT) CAPS Take 5,000 Units by mouth daily.    [provider]  diclofenac sodium (VOLTAREN) 1 % GEL Apply 2 g topically 4 (four) times daily. 10/31/18   Seawell, Jaimie A, DO  Dulaglutide (TRULICITY) 0.30 DT/1.4HO SOPN Inject 0.75 mg into the skin once a week. 09/25/19   Seawell, Jaimie A, DO  empagliflozin (JARDIANCE) 25 MG  TABS tablet Take 1 tablet (25 mg total) by mouth daily. 02/17/20 08/15/20  Lacinda Axon, MD  furosemide (LASIX) 20 MG tablet Take 1 tablet (20 mg total) by mouth daily. 03/20/19 07/05/19  [name], MD  isosorbide mononitrate (IMDUR) 120 MG 24 hr tablet TAKE 1 TABLET (120 MG TOTAL) BY MOUTH DAILY. 07/24/19   [name], MD  losartan (COZAAR) 100 MG tablet Take 1 tablet (100 mg total) by mouth daily. 02/17/20   Lacinda Axon, MD  metFORMIN (GLUCOPHAGE-XR) 500 MG 24 hr tablet Take 4 tablets (2,000 mg total) by mouth daily with breakfast. 10/24/19   Seawell, Jaimie A, DO  Multiple Vitamins-Minerals (MULTIVITAMIN GUMMIES ADULT) CHEW Chew 2 tablets by mouth daily.  [provider]  nitroGLYCERIN (NITROSTAT) 0.4 MG SL tablet Place 1 tablet (0.4 mg total) under the tongue every 5 (five) minutes as needed for chest pain. 08/29/18 07/05/19  Seawell, Jaimie A, DO  omeprazole (PRILOSEC) 20 MG capsule TAKE 1 CAPSULE (20 MG TOTAL) BY MOUTH DAILY. 12/12/19   Virl Axe, MD  tiZANidine (ZANAFLEX) 4 MG tablet Take 1 tablet (4 mg total) by mouth every 6 (six) hours as needed for muscle spasms. 02/17/20   Lacinda Axon, MD  UNIFINE PENTIPS 31G X 5 MM MISC USE TO INJECT 20 UNITS DAILY 04/09/19   Seawell, Jaimie A, DO    Allergies    Lisinopril and Plavix [clopidogrel bisulfate]  Review of Systems   Review of Systems  Constitutional: Positive for fatigue. Negative for chills and fever.  HENT: Positive for congestion. Negative for rhinorrhea and sore throat.   Eyes: Negative for visual disturbance.  Respiratory: Positive for cough and shortness of breath.   Cardiovascular: Negative for chest pain and leg swelling.  Gastrointestinal: Positive for abdominal pain and vomiting. Negative for diarrhea and nausea.  Genitourinary: Negative for dysuria.  Musculoskeletal: Positive for myalgias. Negative for back pain and neck pain.  Skin: Negative for rash.  Neurological: Negative for  dizziness, light-headedness and headaches.  Hematological: Does not bruise/bleed easily.  Psychiatric/Behavioral: Negative for confusion.    Physical Exam Updated Vital Signs BP (!) 151/79   Pulse 96   Temp 100.3 F (37.9 C) (Oral)   Resp (!) 31   Ht 1.6 m ('5\' 3"' )   Wt 77.1 kg   SpO2 96%   BMI 30.11 kg/m   Physical Exam Vitals and nursing note reviewed.  Constitutional:      General: She is not in acute distress.    Appearance: Normal appearance. She is well-developed and well-nourished.  HENT:     Head: Normocephalic and atraumatic.  Eyes:     Extraocular Movements: Extraocular movements intact.     Conjunctiva/sclera: Conjunctivae normal.     Pupils: Pupils are equal, round, and reactive to light.  Cardiovascular:     Rate and Rhythm: Normal rate and regular rhythm.     Heart sounds: No murmur heard.   Pulmonary:     Effort: Pulmonary effort is normal. No respiratory distress.     Breath sounds: Rhonchi present.  Abdominal:     Palpations: Abdomen is soft.     Tenderness: There is no abdominal tenderness.     Comments: Abdomen soft nontender.  Musculoskeletal:        General: No edema. Normal range of motion.     Cervical back: Neck supple.  Skin:    General: Skin is warm and dry.     Capillary Refill: Capillary refill takes less than 2 seconds.  Neurological:     General: No focal deficit present.     Mental Status: She is alert and oriented to person, place, and time.     Cranial Nerves: No cranial nerve deficit.     Sensory: No sensory deficit.     Motor: No weakness.  Psychiatric:        Mood and Affect: Mood and affect normal.     ED Results / Procedures / Treatments   Labs (all labs ordered are listed, but only abnormal results are displayed) Labs Reviewed  RESP PANEL BY RT-PCR (FLU A&B, COVID) ARPGX2 - Abnormal; Notable for the following components:      Result Value   SARS Coronavirus 2 by RT  PCR POSITIVE (*)    All other components within  normal limits  CBC WITH DIFFERENTIAL/PLATELET - Abnormal; Notable for the following components:   RBC 5.36 (*)    Hemoglobin 15.5 (*)    HCT 47.5 (*)    All other components within normal limits  CBG MONITORING, ED - Abnormal; Notable for the following components:   Glucose-Capillary 362 (*)    All other components within normal limits  CULTURE, BLOOD (ROUTINE X 2)  CULTURE, BLOOD (ROUTINE X 2)  LACTIC ACID, PLASMA  LACTIC ACID, PLASMA  PATHOLOGIST SMEAR REVIEW    EKG EKG Interpretation  Date/Time:  Sunday April 12 2020 17:36:32 EST Ventricular Rate:  91 PR Interval:    QRS Duration: 149 QT Interval:  409 QTC Calculation: 504 R Axis:   -18 Text Interpretation: Sinus rhythm Left bundle branch block No significant change since last tracing Confirmed by Fredia Sorrow [phone]) on 04/12/2020 5:52:17 PM   Radiology DG Chest Port 1 View  Result Date: 04/12/2020 CLINICAL DATA:  COVID.  Cough. EXAM: PORTABLE CHEST 1 VIEW COMPARISON:  03/12/2019 FINDINGS: Patchy airspace opacities in both lower lung zones and right upper lobe. Linear atelectasis/scarring in the left mid lung unchanged. The heart is normal in size with normal mediastinal contours. No pneumothorax or significant pleural effusion. No acute osseous abnormalities are seen. IMPRESSION: Patchy airspace opacities in both lower lung zones and right upper lobe, suspicious for pneumonia in the setting of COVID-19. Electronically Signed   By: Keith Rake M.D.   On: 04/12/2020 19:55    Procedures Procedures (including critical care time)  CRITICAL CARE Performed by: Fredia Sorrow Total critical care time: 60 minutes Critical care time was exclusive of separately billable procedures and treating other patients. Critical care was necessary to treat or prevent imminent or life-threatening deterioration. Critical care was time spent personally by me on the following activities: development of treatment plan with patient  and/or surrogate as well as nursing, discussions with consultants, evaluation of patient's response to treatment, examination of patient, obtaining history from patient or surrogate, ordering and performing treatments and interventions, ordering and review of laboratory studies, ordering and review of radiographic studies, pulse oximetry and re-evaluation of patient's condition.   Medications Ordered in ED Medications  sodium chloride flush (NS) 0.9 % injection 3 mL (has no administration in time range)  sodium chloride flush (NS) 0.9 % injection 3 mL (has no administration in time range)  0.9 %  sodium chloride infusion (has no administration in time range)  remdesivir 200 mg in sodium chloride 0.9% 250 mL IVPB (has no administration in time range)    Followed by  remdesivir 100 mg in sodium chloride 0.9 % 100 mL IVPB (has no administration in time range)  dexamethasone (DECADRON) injection 6 mg (6 mg Intravenous Given 04/12/20 2113)    ED Course  I have reviewed the triage vital signs and the nursing notes.  Pertinent labs & imaging results that were available during my care of the patient were reviewed by me and considered in my medical decision making (see chart for details).    MDM Rules/Calculators/A&P                          Patient's x-ray consistent with Covid pneumonia. Patient's Covid testing here is positive. Patient's abdomen soft nontender. Patient's been feeling kind of short of breath for about 3 weeks. We will go ahead and get CT angio. Ordered Covid labs.  Shortly after arrival patient became hypoxic. Sats going down to 88% on room air. Patient started on 2 L with brought oxygen sats up to the mid 90s. Patient feels a little bit better on the oxygen but states she still has some left anterior chest discomfort.  Since EKG consistent with left bundle branch block. That is unchanged from old.  Will require admission for Covid hypoxia. Will [place] get CT angio to rule out  pulmonary embolus. Have ordered remdesivir and Decadron.     Final Clinical Impression(s) / ED Diagnoses Final diagnoses:  COVID  Hypoxia    Rx / DC Orders ED Discharge Orders    None       Fredia Sorrow, MD 04/12/20 2149

## 2020-04-12 NOTE — ED Triage Notes (Signed)
Per EMS report: pt reported vomiting 2-3 weeks ago and since then has had difficulty walking. Pt was able to stand and get onto EMS stretcher. Reports "not feeling well"

## 2020-04-12 NOTE — ED Triage Notes (Signed)
Pt states "not feeling well" for several weeks. States has been coughing up phlegm and her back and stomach hurt. Been having generalized weakness. Vomiting intermittently with abdominal pain. Hasnt been tested for covid, not vaccinated for covid or flu. States husband also sick, was taken by EMS today with low O2 levels.

## 2020-04-12 NOTE — ED Notes (Signed)
COVID+, results given to ED MD

## 2020-04-12 NOTE — ED Notes (Signed)
In to round on pt, POX monitoring indicates POX of 88-89% on RA, probe readjusted, no change in value  Noted, oxygen at 2lpm via [place] initiated. Pt only complains of feeling tired and weak at this time.

## 2020-04-13 DIAGNOSIS — N179 Acute kidney failure, unspecified: Secondary | ICD-10-CM | POA: Diagnosis not present

## 2020-04-13 DIAGNOSIS — E1151 Type 2 diabetes mellitus with diabetic peripheral angiopathy without gangrene: Secondary | ICD-10-CM | POA: Diagnosis not present

## 2020-04-13 DIAGNOSIS — E7800 Pure hypercholesterolemia, unspecified: Secondary | ICD-10-CM | POA: Diagnosis not present

## 2020-04-13 DIAGNOSIS — J9601 Acute respiratory failure with hypoxia: Secondary | ICD-10-CM | POA: Diagnosis not present

## 2020-04-13 DIAGNOSIS — N183 Chronic kidney disease, stage 3 unspecified: Secondary | ICD-10-CM | POA: Diagnosis not present

## 2020-04-13 DIAGNOSIS — I5042 Chronic combined systolic (congestive) and diastolic (congestive) heart failure: Secondary | ICD-10-CM | POA: Diagnosis not present

## 2020-04-13 DIAGNOSIS — J1282 Pneumonia due to coronavirus disease 2019: Secondary | ICD-10-CM | POA: Diagnosis not present

## 2020-04-13 DIAGNOSIS — E1165 Type 2 diabetes mellitus with hyperglycemia: Secondary | ICD-10-CM | POA: Diagnosis not present

## 2020-04-13 DIAGNOSIS — Z833 Family history of diabetes mellitus: Secondary | ICD-10-CM | POA: Diagnosis not present

## 2020-04-13 DIAGNOSIS — J189 Pneumonia, unspecified organism: Secondary | ICD-10-CM | POA: Diagnosis not present

## 2020-04-13 DIAGNOSIS — I252 Old myocardial infarction: Secondary | ICD-10-CM | POA: Diagnosis not present

## 2020-04-13 DIAGNOSIS — E6609 Other obesity due to excess calories: Secondary | ICD-10-CM

## 2020-04-13 DIAGNOSIS — Z96642 Presence of left artificial hip joint: Secondary | ICD-10-CM | POA: Diagnosis not present

## 2020-04-13 DIAGNOSIS — E1122 Type 2 diabetes mellitus with diabetic chronic kidney disease: Secondary | ICD-10-CM | POA: Diagnosis not present

## 2020-04-13 DIAGNOSIS — N1832 Chronic kidney disease, stage 3b: Secondary | ICD-10-CM | POA: Diagnosis not present

## 2020-04-13 DIAGNOSIS — I951 Orthostatic hypotension: Secondary | ICD-10-CM | POA: Diagnosis not present

## 2020-04-13 DIAGNOSIS — U071 COVID-19: Secondary | ICD-10-CM

## 2020-04-13 DIAGNOSIS — Z888 Allergy status to other drugs, medicaments and biological substances status: Secondary | ICD-10-CM | POA: Diagnosis not present

## 2020-04-13 DIAGNOSIS — R059 Cough, unspecified: Secondary | ICD-10-CM | POA: Diagnosis not present

## 2020-04-13 DIAGNOSIS — Z6830 Body mass index (BMI) 30.0-30.9, adult: Secondary | ICD-10-CM | POA: Diagnosis not present

## 2020-04-13 DIAGNOSIS — R0902 Hypoxemia: Secondary | ICD-10-CM | POA: Diagnosis not present

## 2020-04-13 DIAGNOSIS — E669 Obesity, unspecified: Secondary | ICD-10-CM | POA: Diagnosis not present

## 2020-04-13 DIAGNOSIS — N159 Renal tubulo-interstitial disease, unspecified: Secondary | ICD-10-CM | POA: Diagnosis not present

## 2020-04-13 DIAGNOSIS — I130 Hypertensive heart and chronic kidney disease with heart failure and stage 1 through stage 4 chronic kidney disease, or unspecified chronic kidney disease: Secondary | ICD-10-CM | POA: Diagnosis not present

## 2020-04-13 DIAGNOSIS — Z79899 Other long term (current) drug therapy: Secondary | ICD-10-CM | POA: Diagnosis not present

## 2020-04-13 DIAGNOSIS — J90 Pleural effusion, not elsewhere classified: Secondary | ICD-10-CM | POA: Diagnosis not present

## 2020-04-13 DIAGNOSIS — K219 Gastro-esophageal reflux disease without esophagitis: Secondary | ICD-10-CM | POA: Diagnosis not present

## 2020-04-13 DIAGNOSIS — J440 Chronic obstructive pulmonary disease with acute lower respiratory infection: Secondary | ICD-10-CM | POA: Diagnosis not present

## 2020-04-13 DIAGNOSIS — Z7984 Long term (current) use of oral hypoglycemic drugs: Secondary | ICD-10-CM | POA: Diagnosis not present

## 2020-04-13 DIAGNOSIS — G8929 Other chronic pain: Secondary | ICD-10-CM | POA: Diagnosis not present

## 2020-04-13 HISTORY — DX: COVID-19: U07.1

## 2020-04-13 LAB — BASIC METABOLIC PANEL
Anion gap: 18 — ABNORMAL HIGH (ref 5–15)
BUN: 52 mg/dL — ABNORMAL HIGH (ref 8–23)
CO2: 15 mmol/L — ABNORMAL LOW (ref 22–32)
Calcium: 8.4 mg/dL — ABNORMAL LOW (ref 8.9–10.3)
Chloride: 102 mmol/L (ref 98–111)
Creatinine, Ser: 2.48 mg/dL — ABNORMAL HIGH (ref 0.44–1.00)
GFR, Estimated: 21 mL/min — ABNORMAL LOW (ref >60)
Glucose, Bld: 555 mg/dL (ref 70–99)
Potassium: 4.9 mmol/L (ref 3.5–5.1)
Sodium: 135 mmol/L (ref 135–145)

## 2020-04-13 LAB — CBC
HCT: 42.7 % (ref 36.0–46.0)
Hemoglobin: 13.4 g/dL (ref 12.0–15.0)
MCH: 28.2 pg (ref 26.0–34.0)
MCHC: 31.4 g/dL (ref 30.0–36.0)
MCV: 89.9 fL (ref 80.0–100.0)
Platelets: 168 10*3/uL (ref 150–400)
RBC: 4.75 MIL/uL (ref 3.87–5.11)
RDW: 14.4 % (ref 11.5–15.5)
WBC: 3.3 10*3/uL — ABNORMAL LOW (ref 4.0–10.5)
nRBC: 0 % (ref 0.0–0.2)

## 2020-04-13 LAB — CBG MONITORING, ED: Glucose-Capillary: >600 mg/dL (ref 70–99)

## 2020-04-13 LAB — GLUCOSE, CAPILLARY
Glucose-Capillary: >600 mg/dL (ref 70–99)
Glucose-Capillary: 543 mg/dL (ref 70–99)
Glucose-Capillary: 382 mg/dL — ABNORMAL HIGH (ref 70–99)
Glucose-Capillary: 297 mg/dL — ABNORMAL HIGH (ref 70–99)

## 2020-04-13 LAB — PROCALCITONIN: Procalcitonin: 0.23 ng/mL

## 2020-04-13 LAB — FIBRINOGEN: Fibrinogen: 547 mg/dL — ABNORMAL HIGH (ref 210–475)

## 2020-04-13 LAB — PATHOLOGIST SMEAR REVIEW: Path Review: REACTIVE

## 2020-04-13 LAB — LACTATE DEHYDROGENASE: LDH: 234 U/L — ABNORMAL HIGH (ref 98–192)

## 2020-04-13 LAB — TRIGLYCERIDES: Triglycerides: 176 mg/dL — ABNORMAL HIGH (ref <150)

## 2020-04-13 MED ORDER — DEXAMETHASONE 6 MG PO TABS
6.0000 mg | ORAL_TABLET | ORAL | Status: DC
  Administered 2020-04-13 – 2020-04-14 (×2): 6 mg via ORAL
  Filled 2020-04-13 (×2): qty 1

## 2020-04-13 MED ORDER — SODIUM CHLORIDE 0.9 % IV SOLN: 100.0000 mg | Freq: Every day | INTRAVENOUS | Status: DC

## 2020-04-13 MED ORDER — INSULIN GLARGINE 100 UNIT/ML ~~LOC~~ SOLN
24.0000 [IU] | Freq: Every day | SUBCUTANEOUS | Status: DC
  Administered 2020-04-13: 15:00:00 24 [IU] via SUBCUTANEOUS
  Filled 2020-04-13 (×2): qty 0.24

## 2020-04-13 MED ORDER — SODIUM CHLORIDE 0.9 % IV SOLN: 200.0000 mg | Freq: Once | INTRAVENOUS | Status: DC

## 2020-04-13 MED ORDER — INSULIN ASPART 100 UNIT/ML ~~LOC~~ SOLN
0.0000 [IU] | Freq: Every day | SUBCUTANEOUS | Status: DC
  Administered 2020-04-13 – 2020-04-15 (×2): 3 [IU] via SUBCUTANEOUS

## 2020-04-13 MED ORDER — LIVING WELL WITH DIABETES BOOK
Freq: Once | Status: AC
  Filled 2020-04-13 (×2): qty 1

## 2020-04-13 MED ORDER — INSULIN ASPART 100 UNIT/ML ~~LOC~~ SOLN
0.0000 [IU] | Freq: Three times a day (TID) | SUBCUTANEOUS | Status: DC
  Administered 2020-04-13 (×3): 20 [IU] via SUBCUTANEOUS
  Administered 2020-04-14: 09:00:00 15 [IU] via SUBCUTANEOUS
  Administered 2020-04-14: 13:00:00 11 [IU] via SUBCUTANEOUS
  Administered 2020-04-14 – 2020-04-15 (×2): 4 [IU] via SUBCUTANEOUS
  Administered 2020-04-15 – 2020-04-16 (×3): 7 [IU] via SUBCUTANEOUS
  Administered 2020-04-17: 13:00:00 3 [IU] via SUBCUTANEOUS
  Administered 2020-04-18: 12:00:00 7 [IU] via SUBCUTANEOUS
  Administered 2020-04-18: 18:00:00 11 [IU] via SUBCUTANEOUS
  Administered 2020-04-19: 17:00:00 4 [IU] via SUBCUTANEOUS
  Administered 2020-04-19 – 2020-04-20 (×2): 3 [IU] via SUBCUTANEOUS
  Filled 2020-04-13: qty 20

## 2020-04-13 MED ORDER — HYDROCOD POLST-CPM POLST ER 10-8 MG/5ML PO SUER: 5.0000 mL | Freq: Two times a day (BID) | ORAL | Status: DC | PRN

## 2020-04-13 MED ORDER — ACETAMINOPHEN 325 MG PO TABS: 650.0000 mg | ORAL_TABLET | Freq: Four times a day (QID) | ORAL | Status: DC | PRN

## 2020-04-13 MED ORDER — LACTATED RINGERS IV BOLUS
1000.0000 mL | Freq: Once | INTRAVENOUS | Status: AC
  Administered 2020-04-13: 13:00:00 1000 mL via INTRAVENOUS

## 2020-04-13 MED ORDER — ENOXAPARIN SODIUM 30 MG/0.3ML ~~LOC~~ SOLN
30.0000 mg | SUBCUTANEOUS | Status: DC
  Administered 2020-04-13 – 2020-04-15 (×3): 30 mg via SUBCUTANEOUS
  Filled 2020-04-13 (×3): qty 0.3

## 2020-04-13 MED ORDER — DM-GUAIFENESIN ER 30-600 MG PO TB12
1.0000 | ORAL_TABLET | Freq: Two times a day (BID) | ORAL | Status: DC
  Administered 2020-04-13 – 2020-04-20 (×15): 1 via ORAL
  Filled 2020-04-13 (×15): qty 1

## 2020-04-13 MED ORDER — CARVEDILOL 12.5 MG PO TABS
12.5000 mg | ORAL_TABLET | Freq: Two times a day (BID) | ORAL | Status: DC
  Administered 2020-04-13 – 2020-04-20 (×14): 12.5 mg via ORAL
  Filled 2020-04-13 (×14): qty 1

## 2020-04-13 MED ORDER — ASPIRIN EC 81 MG PO TBEC
81.0000 mg | DELAYED_RELEASE_TABLET | Freq: Every day | ORAL | Status: DC
  Administered 2020-04-13 – 2020-04-20 (×8): 81 mg via ORAL
  Filled 2020-04-13 (×8): qty 1

## 2020-04-13 MED ORDER — LACTATED RINGERS IV SOLN: INTRAVENOUS | Status: AC

## 2020-04-13 MED ORDER — LACTATED RINGERS IV BOLUS
1000.0000 mL | Freq: Once | INTRAVENOUS | Status: AC
  Administered 2020-04-13: 11:00:00 1000 mL via INTRAVENOUS

## 2020-04-13 MED ORDER — ATORVASTATIN CALCIUM 40 MG PO TABS
40.0000 mg | ORAL_TABLET | Freq: Every day | ORAL | Status: DC
  Administered 2020-04-13 – 2020-04-20 (×8): 40 mg via ORAL
  Filled 2020-04-13 (×8): qty 1

## 2020-04-13 MED ORDER — PANTOPRAZOLE SODIUM 40 MG PO TBEC
40.0000 mg | DELAYED_RELEASE_TABLET | Freq: Every day | ORAL | Status: DC
  Administered 2020-04-13 – 2020-04-20 (×8): 40 mg via ORAL
  Filled 2020-04-13 (×8): qty 1

## 2020-04-13 MED ORDER — INSULIN ASPART 100 UNIT/ML ~~LOC~~ SOLN
20.0000 [IU] | Freq: Once | SUBCUTANEOUS | Status: AC
  Administered 2020-04-13: 11:00:00 20 [IU] via SUBCUTANEOUS

## 2020-04-13 MED ORDER — INSULIN ASPART 100 UNIT/ML ~~LOC~~ SOLN
8.0000 [IU] | Freq: Three times a day (TID) | SUBCUTANEOUS | Status: DC
  Administered 2020-04-13 (×2): 8 [IU] via SUBCUTANEOUS

## 2020-04-13 NOTE — ED Notes (Signed)
Attempt to call report to inpatient RN, nurse not available to come to the phone,  Left message to call back, notified that [facility] at bedside for transport

## 2020-04-13 NOTE — ED Notes (Signed)
Patient called out because bed was wet, changed linen and put purewick on patient, gave breakfast and drink

## 2020-04-13 NOTE — Progress Notes (Signed)
Inpatient Diabetes Program Recommendations  AACE/ADA: New Consensus Statement on Inpatient Glycemic Control (2015)  Target Ranges:  Prepandial:   less than 140 mg/dL      Peak postprandial:   less than 180 mg/dL (1-2 hours)      Critically ill patients:  140 - 180 mg/dL   Lab Results  Component Value Date   DXAJOI 786 ([place]) 04/13/2020   HGBA1C 8.9 (A) 02/17/2020    Review of Glycemic Control Results for [name], [name] (MRN [id number]) as of 04/13/2020 13:29  Ref. Range 04/12/2020 17:40 04/13/2020 08:59 04/13/2020 10:42 04/13/2020 12:21  Glucose-Capillary Latest Ref Range: 70 - 99 mg/dL 362 (H) >600 (HH) >600 (HH) 543 (HH)   Diabetes history: DM2 Outpatient Diabetes medications: Jardiance 25 + Metformin 2 gm q am + Trulicity 9.62 Current orders for Inpatient glycemic control: Lantus 24 units qd + Novolog 8 units tid + Novolog 0-20 units tid correction + hs 0-5 units + Decadron 6 mg  Inpatient Diabetes Program Recommendations:   Patient transferred from [facility]: 0911 am Novolog 20 units given  1121 am Novolog 20 units given  1315 pm Novolog 28 units given   Secure chat sent to Dr. Heber Ursina and RN Sherle Poe recommending [place] sensor to assist with monitoring patient's glucose levels.  Thank you, Nani Gasser. Chyan Carnero, RN, MSN, CDE  Diabetes Coordinator Inpatient Glycemic Control Team Team Pager [phone] (8am-5pm) 04/13/2020 1:52 PM

## 2020-04-13 NOTE — ED Provider Notes (Signed)
Patient signed out pending lab work and CTA.  Known Covid positive now requiring supplemental oxygen.  She is on stable 2 L.  Lab work reviewed.  Increased creatinine from baseline to 2.4.  D-dimer, fibrinogen, LDH, and triglycerides all elevated.  Unfortunately given creatinine level, cannot obtain CTA.  She is a continuity patient to the internal medicine teaching service.  Spoke with resident.  We will plan for admission to [facility] under the internal medicine teaching service.  [name] was evaluated in Emergency Department on 04/13/2020 for the symptoms described in the history of present illness. She was evaluated in the context of the global COVID-19 pandemic, which necessitated consideration that the patient might be at risk for infection with the SARS-CoV-2 virus that causes COVID-19. Institutional protocols and algorithms that pertain to the evaluation of patients at risk for COVID-19 are in a state of rapid change based on information released by regulatory bodies including the CDC and federal and state organizations. These policies and algorithms were followed during the patient's care in the ED.    [name], MD 04/13/20 [phone]

## 2020-04-13 NOTE — ED Notes (Signed)
[facility] arrived to transfer to inpatient unit.

## 2020-04-13 NOTE — ED Notes (Signed)
cbg result given to Dr Tyrone Nine, suggested giving 20units as ordered and continue to recheck as inpatient per attending.

## 2020-04-13 NOTE — ED Notes (Signed)
Report provided to [facility] for transport. 

## 2020-04-13 NOTE — H&P (Signed)
Date: 04/13/2020               Patient Name:  [name] MRN: [id number]  DOB: 12/02/1955 Age / Sex: 64 y.o., female   PCP: Marty Heck, DO         Medical Service: Internal Medicine Teaching Service         Attending Physician: Dr. Lucious Groves, DO    First Contact: Dr. Lisabeth Devoid Pager: [phone]  Second Contact: Dr. Marianna Payment Pager: [phone]       After Hours (After 5p/  First Contact Pager: [phone]  weekends / holidays): Second Contact Pager: [phone]   Chief Complaint: Cough, transferring for Covid 19 Pneumonia  History of Present Illness: [name] is a 64 year old person living with HTN, CKD stage III, chronic combined systolic and diastolic heart failure (improved on last Echo), type 2 diabetes who presented to [facility] with cough. Symptoms began approximately 2 weeks ago. She initially had productive cough and sneezing. She assumed it was a basic cold and that she would improve over the next few days. She subsequently developed nausea, vomiting and diarrhea the following week, as well as worsening fatigue. She has not had muich of an appetite and hasn't been able to take her home medications, including her Trulicity. She denies any fevers, chills, chest pain, shortness of breath, or urinary symptoms. She lives with her grandchildren at home. She noted one of them had a cough, but no one has been ill like she has. She is not vaccinated for COVID-19 or influenza.  Patient found to be positive for Covid-19 and being transferred to [facility] for further care.    Meds:   Amlodipine 5 mg daily Aspirin 81 mg daily Atorvastatin 40 mg daily Coreg 74.0 mg BID  Trulicity 8.14 mg weekly  Jardiance 25 mg daily Lasix 20 mg daily Imdur 120 mg daily  Losartan 100 mg daily  Metformin 2000 mg daily  Omeprazole 20 mg daily    Allergies: Allergies as of 04/12/2020 - Review Complete 04/12/2020  Allergen Reaction Noted  . Lisinopril Cough 06/29/2017  . Plavix  [clopidogrel bisulfate] Rash 12/21/2012   Past Medical History:  Diagnosis Date  . Anemia 07/17/2013  . Arthritis    "hands, shoulders, hips, legs" (05/10/2018)  . Arthritis of left hip 08/18/2013   S/p total hip arthroplasty.    . CHF (congestive heart failure) ([place])    2014...@ [facility]  . Chronic lower back pain   . CKD (chronic kidney disease) stage 3, GFR 30-59 ml/min (HCC)   . COPD (chronic obstructive pulmonary disease) ([place])   . Healthcare maintenance 06/16/2015  . High cholesterol   . Hypertension   . Influenza A 06/01/2017  . Influenza, pneumonia 06/01/2017  . Left flank pain 10/11/2016  . Myocardial infarction [facility])    "been told that I've had one; don't know when it would have been" (05/10/2018)  . Problem with sexual relationship 10/13/2017  . PVD (peripheral vascular disease) ([place])   . Shortness of breath 03/12/2019  . Type II diabetes mellitus ([place])    diagnosed 2001    Family History:  Family History  Problem Relation Age of Onset  . Diabetes Mother      Social History: lives at home with her grandchildren. Denies tobacco, EtOH or illicit drug use   Review of Systems: A complete ROS was negative except as per HPI.   Physical Exam: Blood pressure 133/86, pulse 80, temperature 98.1 F (  36.7 C), temperature source Oral, resp. rate 20, height 5\' 3"  (1.6 m), weight 77 kg, SpO2 99 %. General: awake, alert, pleasant female lying in bed in NAD HEENT: no scleral icterus or injection, dry mucous membranes CV: RRR Pulm: normal work of breathing; lungs with basilar crackles; otherwise clear Abd: BS+; abdomen soft, non-distended, mildly tender to palpation diffusely  Ext: warm and well perfused; no edema Neuro: A&Ox4; no focal deficits  Skin: warm and dry  Psych: pleasant mood and affect   EKG: personally reviewed my interpretation is NSR with LBBB, consistent with previous tracings   CXR: personally reviewed my interpretation is bilateral patchy opacities   Assessment &  Plan by Problem: Active Problems:   COVID-19  [name] is a 64 year old with a past medical history of chronic combined systolic and diastolic heart failure(improved EF on last echo 08/2018), CKD stage III, and obesity who presented for cough and found to have Covid-19 pneumonia.   COVID-19 Positive test 04/13/2020.  Remdesivir and Decadron started at [facility]. Stable on 2L supplemental oxygen.   --Remdesivir day 1 --Decadron day 1  -- Supplemental oxygen to keep O2 sats above 88% --mucinex DM BID, tussionex prn cough  AKI on CKD stage 3 Cr 2.4. Baseline reported to be 1.3 in 2019, but in chart 1.7 to 2 in the past year.  --likely pre-renal in the setting of above with poor PO intake, n/v  --LR bolus x2 (primarily for hyperglycemia); followed by maintenance fluids   Type 2 diabetes mellitus Hemoglobin A1c 8.9, 02/17/2020.  Home regimen Metformin 1000 mg twice daily, Trulicity 0.5 mg weekly, Jardiance 25 mg daily.  --she is significantly hyperglycemic on arrival with CBG >600 --novolog 20 units now, LR boluses, stat BMP --start Lantus 20 units daily, Novolog 8 units TID wc and SSI-resistant   Chronic combined systolic and diastolic CHF On Losartan, Coreg, and Imdur --no evidence of hyperglycemia on exam --will continue home coreg and hold remaining home meds in setting of AKI and acute infection  Dispo: Admit patient to Inpatient with expected length of stay greater than 2 midnights.  SignedDelice Bison, DO 04/13/2020, 11:52 AM   After 5pm on weekdays and 1pm on weekends: On Call pager: [phone]

## 2020-04-13 NOTE — Progress Notes (Signed)
Patient is at [facility] under Dr.Horton's care. Patient will be transferred to [facility] when a bed is available. Bed order has been placed. Admitting diagnosis is Covid-19 pneumonia. IMTS should be paged by RN when patient arrives.   Pager: [phone]

## 2020-04-14 DIAGNOSIS — U071 COVID-19: Principal | ICD-10-CM

## 2020-04-14 LAB — COMPREHENSIVE METABOLIC PANEL
ALT: 21 U/L (ref 0–44)
AST: 33 U/L (ref 15–41)
Albumin: 2.4 g/dL — ABNORMAL LOW (ref 3.5–5.0)
Alkaline Phosphatase: 71 U/L (ref 38–126)
Anion gap: 12 (ref 5–15)
BUN: 54 mg/dL — ABNORMAL HIGH (ref 8–23)
CO2: 15 mmol/L — ABNORMAL LOW (ref 22–32)
Calcium: 8.6 mg/dL — ABNORMAL LOW (ref 8.9–10.3)
Chloride: 107 mmol/L (ref 98–111)
Creatinine, Ser: 2.09 mg/dL — ABNORMAL HIGH (ref 0.44–1.00)
GFR, Estimated: 26 mL/min — ABNORMAL LOW (ref >60)
Glucose, Bld: 367 mg/dL — ABNORMAL HIGH (ref 70–99)
Potassium: 5.5 mmol/L — ABNORMAL HIGH (ref 3.5–5.1)
Sodium: 134 mmol/L — ABNORMAL LOW (ref 135–145)
Total Bilirubin: 0.6 mg/dL (ref 0.3–1.2)
Total Protein: 6.1 g/dL — ABNORMAL LOW (ref 6.5–8.1)

## 2020-04-14 LAB — CBC WITH DIFFERENTIAL/PLATELET
Abs Immature Granulocytes: 0 10*3/uL (ref 0.00–0.07)
Basophils Absolute: 0 10*3/uL (ref 0.0–0.1)
Basophils Relative: 0 %
Eosinophils Absolute: 0 10*3/uL (ref 0.0–0.5)
Eosinophils Relative: 0 %
HCT: 36.3 % (ref 36.0–46.0)
Hemoglobin: 12 g/dL (ref 12.0–15.0)
Lymphocytes Relative: 15 %
Lymphs Abs: 0.7 10*3/uL (ref 0.7–4.0)
MCH: 29 pg (ref 26.0–34.0)
MCHC: 33.1 g/dL (ref 30.0–36.0)
MCV: 87.7 fL (ref 80.0–100.0)
Monocytes Absolute: 0.2 10*3/uL (ref 0.1–1.0)
Monocytes Relative: 4 %
Neutro Abs: 3.9 10*3/uL (ref 1.7–7.7)
Neutrophils Relative %: 81 %
Platelets: 182 10*3/uL (ref 150–400)
RBC: 4.14 MIL/uL (ref 3.87–5.11)
RDW: 14.5 % (ref 11.5–15.5)
WBC: 4.8 10*3/uL (ref 4.0–10.5)
nRBC: 0 % (ref 0.0–0.2)
nRBC: 0 /100 WBC

## 2020-04-14 LAB — C-REACTIVE PROTEIN: CRP: 8.8 mg/dL — ABNORMAL HIGH (ref <1.0)

## 2020-04-14 LAB — BLOOD GAS, VENOUS
Acid-base deficit: 6.1 mmol/L — ABNORMAL HIGH (ref 0.0–2.0)
Bicarbonate: 18.9 mmol/L — ABNORMAL LOW (ref 20.0–28.0)
O2 Saturation: 88.3 %
Patient temperature: 37
pCO2, Ven: 37.9 mmHg — ABNORMAL LOW (ref 44.0–60.0)
pH, Ven: 7.318 (ref 7.250–7.430)
pO2, Ven: 58.6 mmHg — ABNORMAL HIGH (ref 32.0–45.0)

## 2020-04-14 LAB — GLUCOSE, CAPILLARY
Glucose-Capillary: 67 mg/dL — ABNORMAL LOW (ref 70–99)
Glucose-Capillary: 114 mg/dL — ABNORMAL HIGH (ref 70–99)

## 2020-04-14 LAB — PHOSPHORUS: Phosphorus: 2.9 mg/dL (ref 2.5–4.6)

## 2020-04-14 LAB — MAGNESIUM: Magnesium: 2.1 mg/dL (ref 1.7–2.4)

## 2020-04-14 LAB — HIV ANTIBODY (ROUTINE TESTING W REFLEX): HIV Screen 4th Generation wRfx: NONREACTIVE

## 2020-04-14 LAB — BETA-HYDROXYBUTYRIC ACID: Beta-Hydroxybutyric Acid: 0.14 mmol/L (ref 0.05–0.27)

## 2020-04-14 LAB — D-DIMER, QUANTITATIVE: D-Dimer, Quant: 1.94 ug/mL-FEU — ABNORMAL HIGH (ref 0.00–0.50)

## 2020-04-14 MED ORDER — LACTATED RINGERS IV SOLN: INTRAVENOUS | Status: AC

## 2020-04-14 MED ORDER — INSULIN GLARGINE 100 UNIT/ML ~~LOC~~ SOLN
24.0000 [IU] | Freq: Every day | SUBCUTANEOUS | Status: DC
  Filled 2020-04-14: qty 0.24

## 2020-04-14 MED ORDER — NITROGLYCERIN 0.4 MG SL SUBL
0.4000 mg | SUBLINGUAL_TABLET | SUBLINGUAL | Status: DC | PRN
  Administered 2020-04-14: 18:00:00 0.4 mg via SUBLINGUAL
  Filled 2020-04-14: qty 1

## 2020-04-14 MED ORDER — ONDANSETRON HCL 4 MG/2ML IJ SOLN
4.0000 mg | Freq: Three times a day (TID) | INTRAMUSCULAR | Status: DC | PRN
  Administered 2020-04-16: 15:00:00 4 mg via INTRAVENOUS
  Filled 2020-04-14: qty 2

## 2020-04-14 MED ORDER — INSULIN GLARGINE 100 UNIT/ML ~~LOC~~ SOLN
20.0000 [IU] | Freq: Every day | SUBCUTANEOUS | Status: DC
  Filled 2020-04-14: qty 0.2

## 2020-04-14 MED ORDER — INSULIN GLARGINE 100 UNIT/ML ~~LOC~~ SOLN
24.0000 [IU] | Freq: Two times a day (BID) | SUBCUTANEOUS | Status: DC
  Administered 2020-04-14 – 2020-04-15 (×3): 24 [IU] via SUBCUTANEOUS
  Filled 2020-04-14 (×4): qty 0.24

## 2020-04-14 MED ORDER — LACTATED RINGERS IV BOLUS
1000.0000 mL | Freq: Once | INTRAVENOUS | Status: AC
  Administered 2020-04-14: 09:00:00 1000 mL via INTRAVENOUS

## 2020-04-14 MED ORDER — INSULIN ASPART 100 UNIT/ML ~~LOC~~ SOLN
12.0000 [IU] | Freq: Three times a day (TID) | SUBCUTANEOUS | Status: DC
  Administered 2020-04-14 (×3): 12 [IU] via SUBCUTANEOUS

## 2020-04-14 NOTE — Progress Notes (Addendum)
Patient complaining of R-sided chest pain and describes it as a squeezing pain. Per NT, patient was using the bedside commode and experienced an episode of nausea/vomiting when getting back in bed.  12-lead EKG was obtained and placed in patient chart.  MD notified of both events. New orders were placed for PRN IV zofran and PRN sublingual nitroglycerin.  When RN (self) asked patient if she would like the PRN nitroglycerin or zofran, she stated "the pain has subsided now". PRN orders remain active in [facility]. No other symptoms at this time.

## 2020-04-14 NOTE — Progress Notes (Addendum)
HD#1 Subjective:  Overnight Events: None  Patient states she is feeling better this morning. States that she continues to have a cough but no longer have nausea or vomiting. Her appetite is returning and she have been using incentive spirometry. States she live with husband and husband is currently hospitalized for COVID.   Objective:  Vital signs in last 24 hours: Vitals:   04/13/20 1616 04/13/20 2100 04/14/20 0440 04/14/20 0453  BP: 136/75 (!) 110/59 (!) 148/119 (!) 148/74  Pulse: 79 74 68   Resp: 20 15 20    Temp: (!) 97.5 F (36.4 C) 97.8 F (36.6 C) (!) 97.5 F (36.4 C)   TempSrc: Oral Oral Oral   SpO2:  94% 98%   Weight:      Height:       Supplemental O2: Nasal Cannula SpO2: 98 % O2 Flow Rate (L/min): 2 L/min   Physical Exam:  Physical Exam Constitutional:      Appearance: Normal appearance.  HENT:     Head: Normocephalic and atraumatic.  Eyes:     Extraocular Movements: Extraocular movements intact.     Pupils: Pupils are equal, round, and reactive to light.  Cardiovascular:     Rate and Rhythm: Normal rate.  Pulmonary:     Effort: Pulmonary effort is normal. No respiratory distress.     Comments: Mild expiratory wheezes Abdominal:     General: Abdomen is flat. Bowel sounds are normal.     Palpations: Abdomen is soft.  Musculoskeletal:     Cervical back: Normal range of motion.  Neurological:     Mental Status: She is alert.  Psychiatric:        Mood and Affect: Mood normal.        Behavior: Behavior normal.     Filed Weights   04/12/20 1745 04/13/20 1037  Weight: 77.1 kg 77 kg     Intake/Output Summary (Last 24 hours) at 04/14/2020 0545 Last data filed at 04/14/2020 0504 Gross per 24 hour  Intake 1490 ml  Output 2050 ml  Net -560 ml   Net IO Since Admission: -560 mL [04/14/20 0545]  Pertinent Labs: CBC Latest Ref Rng & Units 04/14/2020 04/13/2020 04/12/2020  WBC 4.0 - 10.5 K/uL 4.8 3.3(L) 4.5  Hemoglobin 12.0 - 15.0 g/dL 12.0 13.4  15.5(H)  Hematocrit 36.0 - 46.0 % 36.3 42.7 47.5(H)  Platelets 150 - 400 K/uL 182 168 170    CMP Latest Ref Rng & Units 04/14/2020 04/13/2020 04/12/2020  Glucose 70 - 99 mg/dL 367(H) 555(HH) 380(H)  BUN 8 - 23 mg/dL 54(H) 52(H) 43(H)  Creatinine 0.44 - 1.00 mg/dL 2.09(H) 2.48(H) 2.40(H)  Sodium 135 - 145 mmol/L 134(L) 135 131(L)  Potassium 3.5 - 5.1 mmol/L 5.5(H) 4.9 5.4(H)  Chloride 98 - 111 mmol/L 107 102 101  CO2 22 - 32 mmol/L 15(L) 15(L) 15(L)  Calcium 8.9 - 10.3 mg/dL 8.6(L) 8.4(L) 8.3(L)  Total Protein 6.5 - 8.1 g/dL 6.1(L) - 7.3  Total Bilirubin 0.3 - 1.2 mg/dL 0.6 - 0.7  Alkaline Phos 38 - 126 U/L 71 - 86  AST 15 - 41 U/L 33 - 24  ALT 0 - 44 U/L 21 - 17   CBG improved to 367 CRP 8.8 ddimer 1.94-1.8 Imaging: No results found.  Assessment/Plan:   Active Problems:   Chronic combined systolic and diastolic CHF, NYHA class 2 (HCC)   Type II diabetes mellitus with stage 3 chronic kidney disease (HCC)   Acute respiratory failure with hypoxia (HCC)  COVID-19  Patient Summary: [name] is a 64 year old with a past medical history of chronic combined systolic and diastolic heart failure(improved EF on last echo 08/2018), CKD stage III, and obesity who presented for cough and found to have Covid-19 pneumonia.   COVID-19 Positive test 04/13/2020. Doing well on room air this morning. Patient concerned about strength as she was very weak at home due to COVID-19 will have PT evaluate --Remdesivir day 2 -- respiratory symptoms seem greatly improved, maintaining good O2 saturation will discontinue Decadron  --mucinex DM BID, tussionex prn cough -- PT/OT  AKI on CKD stage 3 Cr improved to 2.0. Baseline reported to be 1.3 in 2019, but in chart 1.7 to 2 in the past year.  --likely pre-renal in the setting of above with poor PO intake, n/v  --Will give additional liter of LR primarily for hyperglycemia and start on maintain fluids   Type 2 diabetes mellitus Hemoglobin  A1c 8.9, 02/17/2020.  Home regimen Metformin 1000 mg twice daily, Trulicity 0.5 mg weekly, Jardiance 25 mg daily.  --Remains hyperglycemic in 300s this morning, received 99 units novolog and 24 units of lantus  --Will give additional bolus of LR, and LR at 100 mL/hr --increase Lantus 24 units twice daily, Novolog 12 units TID wc and SSI-resistant   Chronic combined systolic and diastolic CHF On Losartan, Coreg, and Imdur --no evidence of hypervolemia on exam --Continue coreg  --Hold losartan and Imdur in setting of AKI and acute infection  Diet: Heart Healthy/Carb modified IVF: LR, 100 mL/hr VTE: Enoxaparin Code: Full  Dispo: Anticipated discharge to Home in pending medical mangement.   Iona Beard, MD 04/14/2020, 5:45 AM Pager: [phone]  Please contact the on call pager after 5 pm and on weekends at [phone].

## 2020-04-15 DIAGNOSIS — U071 COVID-19: Secondary | ICD-10-CM | POA: Diagnosis not present

## 2020-04-15 LAB — COMPREHENSIVE METABOLIC PANEL
ALT: 18 U/L (ref 0–44)
AST: 26 U/L (ref 15–41)
Albumin: 2.2 g/dL — ABNORMAL LOW (ref 3.5–5.0)
Alkaline Phosphatase: 65 U/L (ref 38–126)
Anion gap: 9 (ref 5–15)
BUN: 49 mg/dL — ABNORMAL HIGH (ref 8–23)
CO2: 18 mmol/L — ABNORMAL LOW (ref 22–32)
Calcium: 8.4 mg/dL — ABNORMAL LOW (ref 8.9–10.3)
Chloride: 107 mmol/L (ref 98–111)
Creatinine, Ser: 1.7 mg/dL — ABNORMAL HIGH (ref 0.44–1.00)
GFR, Estimated: 33 mL/min — ABNORMAL LOW (ref >60)
Glucose, Bld: 194 mg/dL — ABNORMAL HIGH (ref 70–99)
Potassium: 4.8 mmol/L (ref 3.5–5.1)
Sodium: 134 mmol/L — ABNORMAL LOW (ref 135–145)
Total Bilirubin: 0.6 mg/dL (ref 0.3–1.2)
Total Protein: 5.4 g/dL — ABNORMAL LOW (ref 6.5–8.1)

## 2020-04-15 LAB — CBC WITH DIFFERENTIAL/PLATELET
Abs Immature Granulocytes: 0 10*3/uL (ref 0.00–0.07)
Basophils Absolute: 0 10*3/uL (ref 0.0–0.1)
Basophils Relative: 0 %
Eosinophils Absolute: 0 10*3/uL (ref 0.0–0.5)
Eosinophils Relative: 0 %
HCT: 35.9 % — ABNORMAL LOW (ref 36.0–46.0)
Hemoglobin: 11.4 g/dL — ABNORMAL LOW (ref 12.0–15.0)
Lymphocytes Relative: 8 %
Lymphs Abs: 0.7 10*3/uL (ref 0.7–4.0)
MCH: 28.1 pg (ref 26.0–34.0)
MCHC: 31.8 g/dL (ref 30.0–36.0)
MCV: 88.4 fL (ref 80.0–100.0)
Monocytes Absolute: 0 10*3/uL — ABNORMAL LOW (ref 0.1–1.0)
Monocytes Relative: 0 %
Neutro Abs: 7.5 10*3/uL (ref 1.7–7.7)
Neutrophils Relative %: 92 %
Platelets: 198 10*3/uL (ref 150–400)
RBC: 4.06 MIL/uL (ref 3.87–5.11)
RDW: 14.4 % (ref 11.5–15.5)
WBC: 8.2 10*3/uL (ref 4.0–10.5)
nRBC: 0 % (ref 0.0–0.2)
nRBC: 0 /100 WBC

## 2020-04-15 LAB — GLUCOSE, CAPILLARY: Glucose-Capillary: 74 mg/dL (ref 70–99)

## 2020-04-15 LAB — C-REACTIVE PROTEIN: CRP: 4.6 mg/dL — ABNORMAL HIGH (ref <1.0)

## 2020-04-15 LAB — D-DIMER, QUANTITATIVE: D-Dimer, Quant: 1.46 ug/mL-FEU — ABNORMAL HIGH (ref 0.00–0.50)

## 2020-04-15 LAB — PHOSPHORUS: Phosphorus: 2.2 mg/dL — ABNORMAL LOW (ref 2.5–4.6)

## 2020-04-15 LAB — MAGNESIUM: Magnesium: 1.8 mg/dL (ref 1.7–2.4)

## 2020-04-15 MED ORDER — LACTATED RINGERS IV SOLN: INTRAVENOUS | Status: AC

## 2020-04-15 MED ORDER — ENOXAPARIN SODIUM 40 MG/0.4ML ~~LOC~~ SOLN
40.0000 mg | SUBCUTANEOUS | Status: DC
  Administered 2020-04-16 – 2020-04-19 (×4): 40 mg via SUBCUTANEOUS
  Filled 2020-04-15 (×4): qty 0.4

## 2020-04-15 MED ORDER — INSULIN ASPART 100 UNIT/ML ~~LOC~~ SOLN
8.0000 [IU] | Freq: Three times a day (TID) | SUBCUTANEOUS | Status: DC
  Administered 2020-04-15 (×2): 8 [IU] via SUBCUTANEOUS

## 2020-04-15 MED ORDER — DEXAMETHASONE 6 MG PO TABS
6.0000 mg | ORAL_TABLET | Freq: Every day | ORAL | Status: DC
  Administered 2020-04-15: 09:00:00 6 mg via ORAL
  Filled 2020-04-15: qty 1

## 2020-04-15 NOTE — Hospital Course (Signed)
Will need covid vaccine one improved

## 2020-04-15 NOTE — Progress Notes (Signed)
Medication: Pharmacy may adjust Lovenox  Indication:  VTE  Prophylaxis.   Assessment:    D-dimer < 2, currently on  Lovenox 30 mg every 24 hours for VTE propylaxis.  Scr decreased to 1.70 today, estimated CrCl 33 ml/min,  BMI =30 D-dimer 1.94> 1.46  Plan: We have increased Lovenox to 40 mg SQ every 24 hours.   Nicole Cella, RPh Clinical Pharmacist 04/15/2020 2:23 PM

## 2020-04-15 NOTE — Evaluation (Signed)
Physical Therapy Evaluation Patient Details Name: [name] MRN: [id number] DOB: 04/21/1956 Today's Date: 04/15/2020   History of Present Illness  Pt is a 64 y.o. female admitted 04/12/20 with acute hypoxic respiratory failure secondary to COVID-19, AKI on CKD 3. PMH includes DM2, COPD, CHF, PVD, MI, chronic LBP.    Clinical Impression  Pt presents with an overall decrease in functional mobility secondary to above. PTA, pt mod indep ambulating with SPC, lives with husband and has daughter nearby who assists with household tasks and transportation as needed. Today, pt c/o dizziness with all mobility, (+) orthostatic hypotension (see values below) limiting mobility progression. Pt reports husband currently admitted at [facility]. Pt limited by generalized weakness, decreased activity tolerance and cardiopulmonary status. Pt would benefit from continued acute PT services to maximize functional mobility and independence prior to d/c with SNF-level therapies; pt in agreement.   SpO2 93-97% on RA  Orthostatic BPs Supine 141/84  Sitting 112/77  Standing 101/62  Return to supine/trendelenberg 143/74       Follow Up Recommendations SNF;Supervision/Assistance - 24 hour    Equipment Recommendations  Wheelchair (measurements PT);Wheelchair cushion (measurements PT);3in1 (PT)    Recommendations for Other Services       Precautions / Restrictions Precautions Precautions: Fall;Other (comment) Precaution Comments: symptomatic with (+) orthostatic hypotension 12/15 Restrictions Weight Bearing Restrictions: No      Mobility  Bed Mobility Overal bed mobility: Needs Assistance Bed Mobility: Supine to Sit;Sit to Supine     Supine to sit: Supervision;HOB elevated Sit to supine: Supervision;HOB elevated   General bed mobility comments: Increased time and effort with c/o dizziness    Transfers Overall transfer level: Needs assistance Equipment used: 1 person hand held assist Transfers:  Sit to/from Stand Sit to Stand: Min assist         General transfer comment: Reliant on minA for HHA to maintain stability, c/o dizziness upon standing with (+) orthostatic hypotension  Ambulation/Gait             General Gait Details: Deferred secondary to symptomatic hypotension  Stairs            Wheelchair Mobility    Modified Rankin (Stroke Patients Only)       Balance Overall balance assessment: Needs assistance   Sitting balance-Leahy Scale: Fair       Standing balance-Leahy Scale: Fair Standing balance comment: Can static stand without UE support, pt with significant sway and stability improved with HHA                             Pertinent Vitals/Pain Pain Assessment: Faces Faces Pain Scale: Hurts a little bit Pain Location: Generalized Pain Descriptors / Indicators: Tiring;Discomfort Pain Intervention(s): Monitored during session;Limited activity within patient's tolerance    Home Living Family/patient expects to be discharged to:: Private residence Living Arrangements: Spouse/significant other Available Help at Discharge: Family Type of Home: Apartment (condo) Home Access: Stairs to enter Entrance Stairs-Rails: None Entrance Stairs-Number of Steps: 3-4 Home Layout: One level Home Equipment: Environmental consultant - 2 wheels;Cane - single point Additional Comments: Pt reports husband currently admitted at [facility]. Daughter lives nearby with her own family and also works    Prior Function Level of Independence: Independent with assistive device(s)         Comments: Pt typically ambulates short distances with SPC (reports short distances due to chronic hip pain); daughter assists with household tasks as needed; daughter drives. Since  getting sick the past ~2 wks, has been "too weak to use my cane or walker, it takes too much effort" and has requiring significant assist from daughter.     Hand Dominance        Extremity/Trunk  Assessment   Upper Extremity Assessment Upper Extremity Assessment: Generalized weakness    Lower Extremity Assessment Lower Extremity Assessment: Generalized weakness       Communication   Communication: Expressive difficulties (soft speech)  Cognition Arousal/Alertness: Awake/alert Behavior During Therapy: Flat affect Overall Cognitive Status: Impaired/Different from baseline Area of Impairment: Attention;Following commands;Safety/judgement;Awareness;Problem solving                   Current Attention Level: Selective   Following Commands: Follows one step commands with increased time;Follows multi-step commands inconsistently Safety/Judgement: Decreased awareness of safety;Decreased awareness of deficits Awareness: Emergent Problem Solving: Slow processing;Requires verbal cues General Comments: Pt with generally flat affect and very soft spoken, suspect mainly due to feeling so poorly; difficult to determine this versus true cognitive impairment      General Comments General comments (skin integrity, edema, etc.): Pt received sprawled out on bed with c/o dizziness after using BSC (returned to bed herself) with BP 141/84, HR 77, SpO2 94% on RA (finger probe); sitting BP 112/77, standing BP 101/62 (return to sit, could not maintain standing); trendelenberg in bed with BP 143/74; RN notified. SPO2 93-97% on RA throughout    Exercises     Assessment/Plan    PT Assessment Patient needs continued PT services  PT Problem List Decreased strength;Decreased activity tolerance;Decreased balance;Decreased mobility;Decreased knowledge of use of DME;Cardiopulmonary status limiting activity       PT Treatment Interventions DME instruction;Gait training;Stair training;Therapeutic activities;Functional mobility training;Therapeutic exercise;Balance training;Patient/family education    PT Goals (Current goals can be found in the Care Plan section)  Acute Rehab PT Goals Patient  Stated Goal: Wants post-acute rehab at SNF to regain strength and independence PT Goal Formulation: With patient Time For Goal Achievement: 04/29/20 Potential to Achieve Goals: Good    Frequency Min 2X/week   Barriers to discharge Decreased caregiver support      Co-evaluation PT/OT/SLP Co-Evaluation/Treatment: Yes Reason for Co-Treatment: For patient/therapist safety;To address functional/ADL transfers;Complexity of the patient's impairments (multi-system involvement) PT goals addressed during session: Mobility/safety with mobility;Balance         AM-PAC PT "6 Clicks" Mobility  Outcome Measure Help needed turning from your back to your side while in a flat bed without using bedrails?: A Little Help needed moving from lying on your back to sitting on the side of a flat bed without using bedrails?: A Little Help needed moving to and from a bed to a chair (including a wheelchair)?: A Little Help needed standing up from a chair using your arms (e.g., wheelchair or bedside chair)?: A Little Help needed to walk in hospital room?: A Lot Help needed climbing 3-5 steps with a railing? : A Lot 6 Click Score: 16    End of Session   Activity Tolerance: Treatment limited secondary to medical complications (Comment) Patient left: in bed;with call bell/phone within reach;with bed alarm set Nurse Communication: Mobility status PT Visit Diagnosis: Other abnormalities of gait and mobility (R26.89);Muscle weakness (generalized) (M62.81)    Time: 3710-6269 PT Time Calculation (min) (ACUTE ONLY): 34 min   Charges:   PT Evaluation $PT Eval Moderate Complexity: [facility], PT, DPT Acute Rehabilitation Services  Pager [phone] Office [phone]  Derry Lory 04/15/2020, 1:09 PM

## 2020-04-15 NOTE — Progress Notes (Addendum)
HD#2 Subjective:  Overnight Events: None  States her breathing is doing well, no SOB at rest, but feeling very fatigues with walking to the bathroom. Discussed that she may feel quite fatigue quite a while due to her COVID-19 infection.   Objective:  Vital signs in last 24 hours: Vitals:   04/14/20 0915 04/14/20 1615 04/14/20 2105 04/15/20 0500  BP: (!) 158/72 (!) 110/52 123/68 126/80  Pulse: 63 73 85 83  Resp:  (!) 23 18 20   Temp:  97.6 F (36.4 C) 98.6 F (37 C) 98.5 F (36.9 C)  TempSrc:  Oral Oral Oral  SpO2:  96% 96% 97%  Weight:      Height:       Supplemental O2: Room air SpO2: 97 % O2 Flow Rate (L/min): 2 L/min   Physical Exam:  Physical Exam Constitutional:      Appearance: Normal appearance.  HENT:     Head: Normocephalic and atraumatic.  Eyes:     Extraocular Movements: Extraocular movements intact.     Pupils: Pupils are equal, round, and reactive to light.  Cardiovascular:     Rate and Rhythm: Normal rate.  Pulmonary:     Effort: Pulmonary effort is normal. No respiratory distress.     Breath sounds: No wheezing.     Comments: Mild expiratory wheezes Abdominal:     General: Abdomen is flat. Bowel sounds are normal.     Palpations: Abdomen is soft.  Musculoskeletal:     Cervical back: Normal range of motion.  Neurological:     Mental Status: She is alert.  Psychiatric:        Mood and Affect: Mood normal.        Behavior: Behavior normal.    Filed Weights   04/12/20 1745 04/13/20 1037  Weight: 77.1 kg 77 kg    Intake/Output Summary (Last 24 hours) at 04/15/2020 0552 Last data filed at 04/15/2020 0505 Gross per 24 hour  Intake 1006.68 ml  Output 3140 ml  Net -2133.32 ml   Net IO Since Admission: -2,693.32 mL [04/15/20 0552]  Pertinent Labs: CBC Latest Ref Rng & Units 04/15/2020 04/14/2020 04/13/2020  WBC 4.0 - 10.5 K/uL 8.2 4.8 3.3(L)  Hemoglobin 12.0 - 15.0 g/dL 11.4(L) 12.0 13.4  Hematocrit 36.0 - 46.0 % 35.9(L) 36.3 42.7   Platelets 150 - 400 K/uL 198 182 168    CMP Latest Ref Rng & Units 04/15/2020 04/14/2020 04/13/2020  Glucose 70 - 99 mg/dL 194(H) 367(H) 555(HH)  BUN 8 - 23 mg/dL 49(H) 54(H) 52(H)  Creatinine 0.44 - 1.00 mg/dL 1.70(H) 2.09(H) 2.48(H)  Sodium 135 - 145 mmol/L 134(L) 134(L) 135  Potassium 3.5 - 5.1 mmol/L 4.8 5.5(H) 4.9  Chloride 98 - 111 mmol/L 107 107 102  CO2 22 - 32 mmol/L 18(L) 15(L) 15(L)  Calcium 8.9 - 10.3 mg/dL 8.4(L) 8.6(L) 8.4(L)  Total Protein 6.5 - 8.1 g/dL 5.4(L) 6.1(L) -  Total Bilirubin 0.3 - 1.2 mg/dL 0.6 0.6 -  Alkaline Phos 38 - 126 U/L 65 71 -  AST 15 - 41 U/L 26 33 -  ALT 0 - 44 U/L 18 21 -   CBG (last 3)  Recent Labs    04/13/20 2108 04/14/20 2112 04/14/20 2147  GLUCAP 297* 67* 114*    Imaging: No results found.  Assessment/Plan:   Active Problems:   Chronic combined systolic and diastolic CHF, NYHA class 2 (HCC)   Type II diabetes mellitus with stage 3 chronic kidney disease ([place])  Acute respiratory failure with hypoxia ([place])   COVID-19  Patient Summary: [name] is a 64 year old with a past medical history of chronic combined systolic and diastolic heart failure(improved EF on last echo 08/2018), CKD stage III, and obesity who presented for cough and found to have Covid-19 pneumonia.   COVID-19 Continue to do well on room air at rest. Will ambulate patient to see how she does. Will plant to stop remdesivir on discharge --Remdesivir day 3 --Discontinue decadron --respiratory symptoms seem greatly improved, maintaining good O2 saturation --mucinex DM BID, tussionex prn cough --PT/OT  AKI on CKD stage 3 Cr improved to 1.7. Baseline reported to be 1.3 in 2019, but in chart 1.7 to 2 in the past year.   Type 2 diabetes mellitus Hemoglobin A1c 8.9, 02/17/2020.  Home regimen Metformin 1000 mg twice daily, Trulicity 0.5 mg weekly, Jardiance 25 mg daily.  --CBG improved to 194 this morning, with episode of CBG 67 yesterday, will decrease  her insulin and likely restart on home meds on discharge  Chronic combined systolic and diastolic CHF On Losartan, Coreg, and Imdur --no evidence of hypervolemia on exam --Continue coreg  --Hold losartan and Imdur in setting of AKI and acute infection  Diet: Heart Healthy/Carb modified  VTE: Enoxaparin Code: Full  Dispo: Anticipated discharge to Home in pending medical mangement.   Iona Beard, MD 04/15/2020, 5:52 AM Pager: [phone]  Please contact the on call pager after 5 pm and on weekends at [phone].

## 2020-04-15 NOTE — Evaluation (Signed)
Occupational Therapy Evaluation Patient Details Name: [name] MRN: [id number] DOB: 15-Oct-1955 Today's Date: 04/15/2020    History of Present Illness Pt is a 64 y.o. female admitted 04/12/20 with acute hypoxic respiratory failure secondary to COVID-19, AKI on CKD 3. PMH includes DM2, COPD, CHF, PVD, MI, chronic LBP.   Clinical Impression   PTA pt living with spouse and requiring as needed assist for ADL/IADL. She reports using a cane vs. Walker due to generalized weakness. She shares that these past few weeks she has been "too weak to use a cane or walker" even around the house. On OT arrival to room, pt sprawled across bed with mesh underwear around knees- having gotten herself back on bed from [facility] without staff assist. She reports feeling dizzy. Orthostatic vitals were then taken (see below) which were positive and pt is symptomatic. She currently requires min A for sit <> stands and is limited in mobility/ADL 2/2 BP instability. She also shares that her husband is admitted at [facility] and her daughter has a family with young children and could not consistently care for her. Given current status, recommend SNF at d/c for continued ADL progression prior to d/c home. Will continue to follow per POC listed below.   Orthostatic BPs Supine 141/84  Sitting 112/77  Standing 101/62  Return to supine/trendelenberg 143/74      Follow Up Recommendations  SNF    Equipment Recommendations  3 in 1 bedside commode;Wheelchair (measurements OT);Wheelchair cushion (measurements OT)    Recommendations for Other Services       Precautions / Restrictions Precautions Precautions: Fall;Other (comment) Precaution Comments: symptomatic with (+) orthostatic hypotension 12/15 Restrictions Weight Bearing Restrictions: No      Mobility Bed Mobility Overal bed mobility: Needs Assistance Bed Mobility: Supine to Sit;Sit to Supine     Supine to sit: Supervision;HOB elevated Sit to supine:  Supervision;HOB elevated   General bed mobility comments: Increased time and effort with c/o dizziness    Transfers Overall transfer level: Needs assistance Equipment used: 1 person hand held assist Transfers: Sit to/from Stand Sit to Stand: Min assist         General transfer comment: Reliant on minA for HHA to maintain stability, c/o dizziness upon standing with (+) orthostatic hypotension    Balance Overall balance assessment: Needs assistance Sitting-balance support: Feet supported Sitting balance-Leahy Scale: Fair     Standing balance support: Single extremity supported;During functional activity Standing balance-Leahy Scale: Fair Standing balance comment: Can static stand without UE support, pt with significant sway and stability improved with HHA                           ADL either performed or assessed with clinical judgement   ADL Overall ADL's : Needs assistance/impaired Eating/Feeding: Set up;Sitting   Grooming: Set up;Sitting   Upper Body Bathing: Minimal assistance;Sitting   Lower Body Bathing: Moderate assistance;Sitting/lateral leans;Sit to/from stand   Upper Body Dressing : Minimal assistance;Sitting   Lower Body Dressing: Moderate assistance;Sitting/lateral leans;Sit to/from stand   Toilet Transfer: Minimal assistance;Stand-pivot;BSC   Toileting- Clothing Manipulation and Hygiene: Maximal assistance;Sit to/from stand       Functional mobility during ADLs: Minimal assistance;Cueing for safety;Cueing for sequencing General ADL Comments: limited 2/2 orthostatic hypotension     Vision Patient Visual Report: No change from baseline       Perception     Praxis      Pertinent Vitals/Pain Pain Assessment: Faces Faces Pain  Scale: Hurts a little bit Pain Location: Generalized Pain Descriptors / Indicators: Tiring;Discomfort Pain Intervention(s): Limited activity within patient's tolerance;Monitored during session;Repositioned      Hand Dominance     Extremity/Trunk Assessment Upper Extremity Assessment Upper Extremity Assessment: Generalized weakness   Lower Extremity Assessment Lower Extremity Assessment: Generalized weakness       Communication Communication Communication: No difficulties   Cognition Arousal/Alertness: Awake/alert Behavior During Therapy: Flat affect Overall Cognitive Status: Impaired/Different from baseline Area of Impairment: Attention;Following commands;Safety/judgement;Awareness;Problem solving                   Current Attention Level: Selective   Following Commands: Follows one step commands with increased time Safety/Judgement: Decreased awareness of safety;Decreased awareness of deficits Awareness: Emergent Problem Solving: Slow processing;Requires verbal cues General Comments: Noted pt with overall decreased sense of safety/deficits. On arrival was laying across bed- had gotten into bed without staff assist when feeling dizzy. Required increased time and repetition to follow basic mobility commands   General Comments     Exercises     Shoulder Instructions      Home Living Family/patient expects to be discharged to:: Private residence Living Arrangements: Spouse/significant other Available Help at Discharge: Family Type of Home: Apartment (condo) Home Access: Stairs to enter Technical brewer of Steps: 3-4 Entrance Stairs-Rails: None Home Layout: One level     Bathroom Shower/Tub: Teacher, early years/pre: Standard     Home Equipment: Environmental consultant - 2 wheels;Cane - single point   Additional Comments: Pt reports husband currently admitted at [facility]. Daughter lives nearby with her own family and also works      Prior Functioning/Environment Level of Independence: Independent with assistive device(s)        Comments: Pt typically ambulates short distances with SPC (reports short distances due to chronic hip pain); daughter  assists with household tasks as needed; daughter drives. Since getting sick the past ~2 wks, has been "too weak to use my cane or walker, it takes too much effort" and has requiring significant assist from daughter.        OT Problem List: Decreased strength;Decreased knowledge of use of DME or AE;Decreased activity tolerance;Cardiopulmonary status limiting activity;Impaired balance (sitting and/or standing);Decreased safety awareness;Decreased cognition      OT Treatment/Interventions: Self-care/ADL training;Therapeutic exercise;Balance training;Energy conservation;Therapeutic activities;DME and/or AE instruction    OT Goals(Current goals can be found in the care plan section) Acute Rehab OT Goals Patient Stated Goal: Wants post-acute rehab at SNF to regain strength and independence OT Goal Formulation: With patient Time For Goal Achievement: 04/29/20 Potential to Achieve Goals: Good  OT Frequency: Min 3X/week   Barriers to D/C:            Co-evaluation PT/OT/SLP Co-Evaluation/Treatment: Yes Reason for Co-Treatment: For patient/therapist safety;To address functional/ADL transfers PT goals addressed during session: Mobility/safety with mobility;Balance OT goals addressed during session: ADL's and self-care;Proper use of Adaptive equipment and DME;Other (comment) (Pt orthostatic and high fall risk)      AM-PAC OT "6 Clicks" Daily Activity     Outcome Measure Help from another person eating meals?: A Little Help from another person taking care of personal grooming?: A Little Help from another person toileting, which includes using toliet, bedpan, or urinal?: A Lot Help from another person bathing (including washing, rinsing, drying)?: A Lot Help from another person to put on and taking off regular upper body clothing?: A Little Help from another person to put on and taking off  regular lower body clothing?: A Lot 6 Click Score: 15   End of Session Nurse Communication: Mobility  status;Other (comment) (BP)  Activity Tolerance: Treatment limited secondary to medical complications (Comment) (+orthostatic hypotension) Patient left: in bed;with call bell/phone within reach  OT Visit Diagnosis: Other abnormalities of gait and mobility (R26.89);Unsteadiness on feet (R26.81);Muscle weakness (generalized) (M62.81)                Time: 3817-7116 OT Time Calculation (min): 32 min Charges:  OT General Charges $OT Visit: 1 Visit OT Evaluation $OT Eval Moderate Complexity: [place], MSOT, OTR/L [place] Office Number: [phone] Pager: [phone]  Zenovia Jarred 04/15/2020, 2:44 PM

## 2020-04-15 NOTE — Progress Notes (Signed)
CSW notes SNF recommendation. Of note, there are no accepting Medicaid COVID SNF beds available at this time. Will continue to follow.   Kylinn Shropshire LCSW

## 2020-04-16 DIAGNOSIS — U071 COVID-19: Secondary | ICD-10-CM | POA: Diagnosis not present

## 2020-04-16 LAB — CBC WITH DIFFERENTIAL/PLATELET
Abs Immature Granulocytes: 0.04 10*3/uL (ref 0.00–0.07)
Basophils Absolute: 0 10*3/uL (ref 0.0–0.1)
Basophils Relative: 0 %
Eosinophils Absolute: 0 10*3/uL (ref 0.0–0.5)
Eosinophils Relative: 0 %
HCT: 38.1 % (ref 36.0–46.0)
Hemoglobin: 12.5 g/dL (ref 12.0–15.0)
Immature Granulocytes: 1 %
Lymphocytes Relative: 21 %
Lymphs Abs: 1 10*3/uL (ref 0.7–4.0)
MCH: 28.2 pg (ref 26.0–34.0)
MCHC: 32.8 g/dL (ref 30.0–36.0)
MCV: 86 fL (ref 80.0–100.0)
Monocytes Absolute: 0.3 10*3/uL (ref 0.1–1.0)
Monocytes Relative: 6 %
Neutro Abs: 3.3 10*3/uL (ref 1.7–7.7)
Neutrophils Relative %: 72 %
Platelets: 207 10*3/uL (ref 150–400)
RBC: 4.43 MIL/uL (ref 3.87–5.11)
RDW: 14.4 % (ref 11.5–15.5)
WBC: 4.6 10*3/uL (ref 4.0–10.5)
nRBC: 0 % (ref 0.0–0.2)

## 2020-04-16 LAB — MAGNESIUM: Magnesium: 1.7 mg/dL (ref 1.7–2.4)

## 2020-04-16 LAB — PHOSPHORUS: Phosphorus: 2.5 mg/dL (ref 2.5–4.6)

## 2020-04-16 LAB — COMPREHENSIVE METABOLIC PANEL
ALT: 20 U/L (ref 0–44)
AST: 25 U/L (ref 15–41)
Albumin: 2.4 g/dL — ABNORMAL LOW (ref 3.5–5.0)
Alkaline Phosphatase: 73 U/L (ref 38–126)
Anion gap: 10 (ref 5–15)
BUN: 43 mg/dL — ABNORMAL HIGH (ref 8–23)
CO2: 21 mmol/L — ABNORMAL LOW (ref 22–32)
Calcium: 8.8 mg/dL — ABNORMAL LOW (ref 8.9–10.3)
Chloride: 105 mmol/L (ref 98–111)
Creatinine, Ser: 1.72 mg/dL — ABNORMAL HIGH (ref 0.44–1.00)
GFR, Estimated: 33 mL/min — ABNORMAL LOW (ref >60)
Glucose, Bld: 308 mg/dL — ABNORMAL HIGH (ref 70–99)
Potassium: 4.9 mmol/L (ref 3.5–5.1)
Sodium: 136 mmol/L (ref 135–145)
Total Bilirubin: 0.8 mg/dL (ref 0.3–1.2)
Total Protein: 5.9 g/dL — ABNORMAL LOW (ref 6.5–8.1)

## 2020-04-16 LAB — C-REACTIVE PROTEIN: CRP: 3.4 mg/dL — ABNORMAL HIGH (ref <1.0)

## 2020-04-16 LAB — D-DIMER, QUANTITATIVE: D-Dimer, Quant: 1.59 ug/mL-FEU — ABNORMAL HIGH (ref 0.00–0.50)

## 2020-04-16 MED ORDER — LOSARTAN POTASSIUM 50 MG PO TABS
100.0000 mg | ORAL_TABLET | Freq: Every day | ORAL | Status: DC
  Administered 2020-04-16 – 2020-04-20 (×5): 100 mg via ORAL
  Filled 2020-04-16 (×5): qty 2

## 2020-04-16 MED ORDER — MAGNESIUM SULFATE 2 GM/50ML IV SOLN
2.0000 g | Freq: Once | INTRAVENOUS | Status: AC
  Administered 2020-04-16: 10:00:00 2 g via INTRAVENOUS
  Filled 2020-04-16: qty 50

## 2020-04-16 MED ORDER — METFORMIN HCL ER 500 MG PO TB24
2000.0000 mg | ORAL_TABLET | Freq: Every day | ORAL | Status: DC
  Administered 2020-04-16 – 2020-04-20 (×5): 2000 mg via ORAL
  Filled 2020-04-16 (×5): qty 4

## 2020-04-16 MED ORDER — INSULIN ASPART 100 UNIT/ML ~~LOC~~ SOLN
8.0000 [IU] | Freq: Once | SUBCUTANEOUS | Status: AC
  Administered 2020-04-16: 10:00:00 8 [IU] via SUBCUTANEOUS

## 2020-04-16 MED ORDER — EMPAGLIFLOZIN 25 MG PO TABS
25.0000 mg | ORAL_TABLET | Freq: Every day | ORAL | Status: DC
  Administered 2020-04-17 – 2020-04-20 (×3): 25 mg via ORAL
  Filled 2020-04-16 (×7): qty 1

## 2020-04-16 MED ORDER — SODIUM CHLORIDE 0.9 % IV SOLN
100.0000 mg | Freq: Every day | INTRAVENOUS | Status: AC
  Administered 2020-04-16: 18:00:00 100 mg via INTRAVENOUS
  Filled 2020-04-16: qty 20

## 2020-04-16 NOTE — Progress Notes (Signed)
HD#3 Subjective:  Overnight Events: None  This morning patient notes she continue to have fatigue when getting up and moving around. She denies any SOB at rest, nausea, vomiting, or diarrhea. Tolerating food well. Amenable to going to SNF, discussed that likely will be difficult to find a bed but will continue to work with PT and TOC.  Objective:  Vital signs in last 24 hours: Vitals:   04/15/20 0500 04/15/20 1205 04/15/20 2104 04/16/20 0500  BP: 126/80 138/74 (!) 161/88 (!) 168/90  Pulse: 83 78 75 76  Resp: 20 20 18 19   Temp: 98.5 F (36.9 C) 98.3 F (36.8 C) 98.3 F (36.8 C) 97.9 F (36.6 C)  TempSrc: Oral Oral Oral Oral  SpO2: 97% 95% 95% 95%  Weight:      Height:       Supplemental O2: Room air 96%  Physical Exam:  Physical Exam Constitutional:      Appearance: Normal appearance.  HENT:     Head: Normocephalic and atraumatic.  Eyes:     Extraocular Movements: Extraocular movements intact.     Pupils: Pupils are equal, round, and reactive to light.  Cardiovascular:     Rate and Rhythm: Normal rate.  Pulmonary:     Effort: Pulmonary effort is normal. No respiratory distress.     Breath sounds: No wheezing.     Comments: Mild expiratory wheezes Abdominal:     General: Abdomen is flat. Bowel sounds are normal.     Palpations: Abdomen is soft.  Musculoskeletal:     Cervical back: Normal range of motion.  Neurological:     Mental Status: She is alert.    Filed Weights   04/12/20 1745 04/13/20 1037  Weight: 77.1 kg 77 kg    Intake/Output Summary (Last 24 hours) at 04/16/2020 0616 Last data filed at 04/15/2020 1501 Gross per 24 hour  Intake 656.64 ml  Output 800 ml  Net -143.36 ml   Net IO Since Admission: -2,836.68 mL [04/16/20 0616]  Pertinent Labs: CBC Latest Ref Rng & Units 04/16/2020 04/15/2020 04/14/2020  WBC 4.0 - 10.5 K/uL 4.6 8.2 4.8  Hemoglobin 12.0 - 15.0 g/dL 12.5 11.4(L) 12.0  Hematocrit 36.0 - 46.0 % 38.1 35.9(L) 36.3  Platelets 150  - 400 K/uL 207 198 182    CMP Latest Ref Rng & Units 04/16/2020 04/15/2020 04/14/2020  Glucose 70 - 99 mg/dL 308(H) 194(H) 367(H)  BUN 8 - 23 mg/dL 43(H) 49(H) 54(H)  Creatinine 0.44 - 1.00 mg/dL 1.72(H) 1.70(H) 2.09(H)  Sodium 135 - 145 mmol/L 136 134(L) 134(L)  Potassium 3.5 - 5.1 mmol/L 4.9 4.8 5.5(H)  Chloride 98 - 111 mmol/L 105 107 107  CO2 22 - 32 mmol/L 21(L) 18(L) 15(L)  Calcium 8.9 - 10.3 mg/dL 8.8(L) 8.4(L) 8.6(L)  Total Protein 6.5 - 8.1 g/dL 5.9(L) 5.4(L) 6.1(L)  Total Bilirubin 0.3 - 1.2 mg/dL 0.8 0.6 0.6  Alkaline Phos 38 - 126 U/L 73 65 71  AST 15 - 41 U/L 25 26 33  ALT 0 - 44 U/L 20 18 21    CBG (last 3)  Recent Labs    04/14/20 2112 04/14/20 2147 04/15/20 0728  GLUCAP 67* 114* 74    Imaging: No results found.  Assessment/Plan:   Active Problems:   Chronic combined systolic and diastolic CHF, NYHA class 2 (HCC)   Type II diabetes mellitus with stage 3 chronic kidney disease ([place])   Acute respiratory failure with hypoxia ([place])   COVID-19  Patient Summary: Alyssa  L [name] is a 64 year old with a past medical history of chronic combined systolic and diastolic heart failure(improved EF on last echo 08/2018), CKD stage III, and obesity who presented for cough and found to have Covid-19 pneumonia.   COVID-19 Minimal respiratory symptoms at rest.Had significant weakness with and hypotension when working with PT yesterday. Likely she is deconditioned from COVID-19. PT/OR recommending SNF at this time. --Completed course of remdesivir today --mucinex DM BID, tussionex prn cough --PT/OT  AKI on CKD stage 3 Cr stable at 1.7 today. Given an additional liter of fluid yesterday due to elevated to BUN today down to 43.  -Continue to monitor kidney function  Type 2 diabetes mellitus Hemoglobin A1c 8.9, 02/17/2020. Home regimen Metformin 1000 mg twice daily, Trulicity 0.5 mg weekly, Jardiance 25 mg daily.  --CBG this morning in 300s likely residual elevation from  glucocorticoids. Will restart home metformin and jardiance - Novolog 8 units this morning, SSI  Chronic combined systolic and diastolic CHF On Losartan, Coreg, and Imdur. Orthostatic hypotension when working with PT yesterday, given additional liter of fluid. CP in 160s this morning will restart home losartan. --no evidence of hypervolemia on exam --Continue coreg. Start losartan 100 mg daily  Diet: Heart Healthy/Carb modified  VTE: Enoxaparin Code: Full  Dispo: Anticipated discharge to SNF in pending placement  Iona Beard, MD 04/16/2020, 6:16 AM Pager: [phone]  Please contact the on call pager after 5 pm and on weekends at [phone].

## 2020-04-16 NOTE — Progress Notes (Signed)
Physical Therapy Treatment Patient Details Name: [name] MRN: [id number] DOB: [birth year] Today's Date: 04/16/2020    History of Present Illness Pt is a 64 y.o. female admitted 04/12/20 with acute hypoxic respiratory failure secondary to COVID-19, AKI on CKD 3. PMH includes DM2, COPD, CHF, PVD, MI, chronic LBP.    PT Comments    Pt was able to progress to ambulation as BP were initially stable; however, pt still became orthostatic toward the end of her walk.  Pt requiring at least min A and cues and with limited mobility and gait distance.  She typically depends on her husband for some assist and he is also admitted with COVID.  Continue to recommend SNF.     Follow Up Recommendations  SNF;Supervision/Assistance - 24 hour     Equipment Recommendations  Wheelchair (measurements PT);Wheelchair cushion (measurements PT);3in1 (PT)    Recommendations for Other Services       Precautions / Restrictions Precautions Precautions: Fall;Other (comment) Precaution Comments: symptomatic with (+) orthostatic hypotension 12/16    Mobility  Bed Mobility Overal bed mobility: Needs Assistance Bed Mobility: Supine to Sit;Sit to Supine     Supine to sit: Min guard Sit to supine: Min guard   General bed mobility comments: Increased time and effort  ; no c/o dizziness  Transfers Overall transfer level: Needs assistance Equipment used: Rolling walker (2 wheeled) Transfers: Sit to/from Stand Sit to Stand: Min assist         General transfer comment: No c/o dizziness initially; min A to steady; performed x 3  Ambulation/Gait Ambulation/Gait assistance: Min assist Gait Distance (Feet): 60 Feet Assistive device: Rolling walker (2 wheeled) Gait Pattern/deviations: Step-to pattern;Antalgic;Shuffle;Decreased stride length Gait velocity: decreased   General Gait Details: Initially no c/o dizziness but did fatigue easily.  In the last 10' started to c/o dizziness and as sitting EOB  to get BP she became more dizzy and less responsive.  Improved upon return to supine.  See BP belwo   Stairs             Wheelchair Mobility    Modified Rankin (Stroke Patients Only)       Balance Overall balance assessment: Needs assistance Sitting-balance support: Feet supported Sitting balance-Leahy Scale: Fair       Standing balance-Leahy Scale: Poor Standing balance comment: required RW and min guard                            Cognition Arousal/Alertness: Awake/alert Behavior During Therapy: WFL for tasks assessed/performed Overall Cognitive Status: Within Functional Limits for tasks assessed                                        Exercises      General Comments General comments (skin integrity, edema, etc.): Pt was on RA with sats maintaining > 90%. Provided increased time with all transfers due to orthostatic hypotension at last visit. She was received in bed. Upon sitting BP 111/74, then 138/72 standing, however , at end of walk becoming dizzy and BP was 79/45 upon return to sitting. Pt returned to supine and up to 100/66. Notified RN.      Pertinent Vitals/Pain Pain Assessment: Faces Faces Pain Scale: Hurts a little bit Pain Location: Generalized Pain Descriptors / Indicators: Discomfort Pain Intervention(s): Limited activity within patient's tolerance;Monitored during session;Repositioned    Home  Living                      Prior Function            PT Goals (current goals can now be found in the care plan section) Acute Rehab PT Goals Patient Stated Goal: Wants post-acute rehab at SNF to regain strength and independence PT Goal Formulation: With patient Time For Goal Achievement: 04/29/20 Potential to Achieve Goals: Good Progress towards PT goals: Progressing toward goals    Frequency    Min 2X/week      PT Plan Current plan remains appropriate    Co-evaluation              AM-PAC PT "6  Clicks" Mobility   Outcome Measure  Help needed turning from your back to your side while in a flat bed without using bedrails?: A Little Help needed moving from lying on your back to sitting on the side of a flat bed without using bedrails?: A Little Help needed moving to and from a bed to a chair (including a wheelchair)?: A Little Help needed standing up from a chair using your arms (e.g., wheelchair or bedside chair)?: A Little Help needed to walk in hospital room?: A Little Help needed climbing 3-5 steps with a railing? : A Lot 6 Click Score: 17    End of Session Equipment Utilized During Treatment: Gait belt Activity Tolerance: Other (comment) (limited due to orthostatic hypotension) Patient left: in bed;with call bell/phone within reach;with bed alarm set Nurse Communication: Mobility status PT Visit Diagnosis: Other abnormalities of gait and mobility (R26.89);Muscle weakness (generalized) (M62.81)     Time: 8144-8185 PT Time Calculation (min) (ACUTE ONLY): 30 min  Charges:  $Gait Training: 8-22 mins $Therapeutic Activity: 8-22 mins                     Abran Richard, PT Acute Rehab Services Pager [phone] [facility] 04/16/2020, 5:51 PM

## 2020-04-16 NOTE — TOC Initial Note (Addendum)
Transition of Care [facility]) - Initial/Assessment Note    Patient Details  Name: [name] MRN: [id number] Date of Birth: [birth year]  Transition of Care [facility] [place]) CM/SW Contact:    Verdell Carmine, RN Phone Number: 04/16/2020, 8:42 AM  Clinical Narrative:                 Patient admitted with COVID pneumonia remdisivir through today, Husband at [facility]. PT evaluation resulted in recommendation of SNF, however patient has Medicaid, no Medicaid beds currently available. Patient has Aunt listed as well as NOK, limited assistance for caregiving and supervision for patient. Attempted to call patient, phone busy at present. To discuss options for discharge planning.   1320 called again to room, patient picked up phone, but hung up. Will try again later.   Expected Discharge Plan: [place] Barriers to Discharge: Family Issues   Patient Goals and CMS Choice        Expected Discharge Plan and Services Expected Discharge Plan: [place] In-house Referral: Clinical Social Work Discharge Planning Services: CM Consult                                          Prior Living Arrangements/Services   Lives with:: Spouse Patient language and need for interpreter reviewed:: Yes        Need for Family Participation in Patient Care: Yes (Comment) Care giver support system in place?: Yes (comment)   Criminal Activity/Legal Involvement Pertinent to Current Situation/Hospitalization: No - Comment as needed  Activities of Daily Living Home Assistive Devices/Equipment: Cane (specify quad or straight) (straight) ADL Screening (condition at time of admission) Patient's cognitive ability adequate to safely complete daily activities?: Yes Is the patient deaf or have difficulty hearing?: No Does the patient have difficulty seeing, even when wearing glasses/contacts?: No Does the patient have difficulty concentrating, remembering, or making decisions?:  No Patient able to express need for assistance with ADLs?: Yes Does the patient have difficulty dressing or bathing?: No Independently performs ADLs?: Yes (appropriate for developmental age) Does the patient have difficulty walking or climbing stairs?: No Weakness of Legs: None Weakness of Arms/Hands: None  Permission Sought/Granted      Share Information with NAME: Spouse admitted at [facility]           Emotional Assessment       Orientation: : Oriented to Self,Oriented to Place,Oriented to  Time,Oriented to Situation Alcohol / Substance Use: Not Applicable    Admission diagnosis:  Hypoxia [R09.02] COVID [U07.1] COVID-19 [U07.1] Patient Active Problem List   Diagnosis Date Noted  . COVID-19 04/13/2020  . Musculoskeletal neck pain 02/17/2020  . Abdominal fullness 09/26/2019  . Incomplete uterine prolapse 11/23/2018  . GERD (gastroesophageal reflux disease) 11/14/2018  . Mild depression ([place]) 04/11/2018  . Nonischemic cardiomyopathy ([place]) 08/31/2017  . Acute respiratory failure with hypoxia ([place]) 06/02/2017  . Pain in toes of both feet 01/26/2017  . Bilateral carotid artery disease ([place]) 11/10/2016  . Seasonal allergies 06/30/2016  . Health care maintenance 06/16/2015  . Nonproliferative diabetic retinopathy associated with type 2 diabetes mellitus ([place]) 06/15/2015  . Osteoarthritis of right hip 06/09/2015  . Type II diabetes mellitus with stage 3 chronic kidney disease ([place]) 07/17/2013  . Chronic combined systolic and diastolic CHF, NYHA class 2 ([place]) 07/07/2013  . CKD (chronic kidney disease) stage 3, GFR 30-59 ml/min (HCC)  11/22/2012  . HTN (hypertension) 11/08/2012   PCP:  Marty Heck, DO Pharmacy:   [facility], [place]. [address] [place] [address] Phone: [phone] Fax: [place], [place] [address] Phone: [phone] Fax: [phone]     Social Determinants of Health (SDOH) Interventions    Readmission Risk Interventions No flowsheet data found.

## 2020-04-16 NOTE — NC FL2 (Signed)
[place] LEVEL OF CARE SCREENING TOOL     IDENTIFICATION  Patient Name: [name] Birthdate: 26-Dec-1955 Sex: female Admission Date (Current Location): 04/12/2020  [facility] [place] Number:  Herbalist and Address:  [facility], [place] [address] [place], [place] [address]      Provider Number: [id number]  Attending Physician Name and Address:  Angelica Pou, MD  Relative Name and Phone Number:  Olga Millers, spouse, [phone]    Current Level of Care: Hospital Recommended Level of Care: [place] Prior Approval Number:    Date Approved/Denied:   PASRR Number: [id number]  Discharge Plan: SNF    Current Diagnoses: Patient Active Problem List   Diagnosis Date Noted  . COVID-19 04/13/2020  . Musculoskeletal neck pain 02/17/2020  . Abdominal fullness 09/26/2019  . Incomplete uterine prolapse 11/23/2018  . GERD (gastroesophageal reflux disease) 11/14/2018  . Mild depression ([place]) 04/11/2018  . Nonischemic cardiomyopathy ([place]) 08/31/2017  . Acute respiratory failure with hypoxia ([place]) 06/02/2017  . Pain in toes of both feet 01/26/2017  . Bilateral carotid artery disease ([place]) 11/10/2016  . Seasonal allergies 06/30/2016  . Health care maintenance 06/16/2015  . Nonproliferative diabetic retinopathy associated with type 2 diabetes mellitus ([place]) 06/15/2015  . Osteoarthritis of right hip 06/09/2015  . Type II diabetes mellitus with stage 3 chronic kidney disease ([place]) 07/17/2013  . Chronic combined systolic and diastolic CHF, NYHA class 2 ([place]) 07/07/2013  . CKD (chronic kidney disease) stage 3, GFR 30-59 ml/min (HCC) 11/22/2012  . HTN (hypertension) 11/08/2012    Orientation RESPIRATION BLADDER Height & Weight     Time,Self,Situation,Place  Normal Incontinent,External catheter Weight: 169 lb 12.8 oz (77 kg) Height:  5\' 3"  (160 cm)  BEHAVIORAL SYMPTOMS/MOOD NEUROLOGICAL BOWEL NUTRITION STATUS       Continent Diet (Please see DC Summary)  AMBULATORY STATUS COMMUNICATION OF NEEDS Skin   Limited Assist Verbally Normal                       Personal Care Assistance Level of Assistance  Bathing,Feeding,Dressing Bathing Assistance: Independent Feeding assistance: Independent Dressing Assistance: Independent     Functional Limitations Info             SPECIAL CARE FACTORS FREQUENCY  PT (By licensed PT),OT (By licensed OT)     PT Frequency: 3x/week OT Frequency: 2x/week            Contractures Contractures Info: Not present    Additional Factors Info  Code Status,Allergies,Isolation Precautions,Insulin Sliding Scale Code Status Info: Full Allergies Info: Lisinopril, Plavix (Clopidogrel Bisulfate)   Insulin Sliding Scale Info: See dc summary Isolation Precautions Info: COVID+     Current Medications (04/16/2020):  This is the current hospital active medication list Current Facility-Administered Medications  Medication Dose Route Frequency Provider Last Rate Last Admin  . 0.9 %  sodium chloride infusion  250 mL Intravenous PRN Madalyn Rob, MD      . acetaminophen (TYLENOL) tablet 650 mg  650 mg Oral Q6H PRN Madalyn Rob, MD      . aspirin EC tablet 81 mg  81 mg Oral Daily Bloomfield, Carley D, DO   81 mg at 04/16/20 0945  . atorvastatin (LIPITOR) tablet 40 mg  40 mg Oral Daily [place]field, Carley D, DO   40 mg at 04/16/20 0945  . carvedilol (COREG) tablet 12.5 mg  12.5 mg Oral BID WC Bloomfield, Carley D, DO  12.5 mg at 04/16/20 0945  . chlorpheniramine-HYDROcodone (TUSSIONEX) 10-8 MG/5ML suspension 5 mL  5 mL Oral Q12H PRN [place], Carley D, DO      . dextromethorphan-guaiFENesin (MUCINEX DM) 30-600 MG per 12 hr tablet 1 tablet  1 tablet Oral BID [place], Carley D, DO   1 tablet at 04/16/20 0945  . empagliflozin (JARDIANCE) tablet 25 mg  25 mg Oral Daily Iona Beard, MD      . enoxaparin (LOVENOX) injection 40 mg  40 mg Subcutaneous Q24H Wendee Beavers,  RPH   40 mg at 04/16/20 1434  . insulin aspart (novoLOG) injection 0-20 Units  0-20 Units Subcutaneous TID WC Madalyn Rob, MD   7 Units at 04/16/20 1435  . insulin aspart (novoLOG) injection 0-5 Units  0-5 Units Subcutaneous QHS Madalyn Rob, MD   3 Units at 04/15/20 2141  . losartan (COZAAR) tablet 100 mg  100 mg Oral Daily Iona Beard, MD      . metFORMIN (GLUCOPHAGE-XR) 24 hr tablet 2,000 mg  2,000 mg Oral Q breakfast Iona Beard, MD   2,000 mg at 04/16/20 0944  . nitroGLYCERIN (NITROSTAT) SL tablet 0.4 mg  0.4 mg Sublingual Q5 Min x 3 PRN Iona Beard, MD   0.4 mg at 04/14/20 1754  . ondansetron (ZOFRAN) injection 4 mg  4 mg Intravenous Q8H PRN Iona Beard, MD      . pantoprazole (PROTONIX) EC tablet 40 mg  40 mg Oral Daily Bloomfield, Carley D, DO   40 mg at 04/16/20 0944  . sodium chloride flush (NS) 0.9 % injection 3 mL  3 mL Intravenous Q12H Madalyn Rob, MD   3 mL at 04/16/20 0946  . sodium chloride flush (NS) 0.9 % injection 3 mL  3 mL Intravenous PRN Madalyn Rob, MD         Discharge Medications: Please see discharge summary for a list of discharge medications.  Relevant Imaging Results:  Relevant Lab Results:   Additional Information SSN: [id number]  Lissa Morales [place], [place]

## 2020-04-17 DIAGNOSIS — U071 COVID-19: Secondary | ICD-10-CM | POA: Diagnosis not present

## 2020-04-17 LAB — CBC WITH DIFFERENTIAL/PLATELET
Abs Immature Granulocytes: 0.02 10*3/uL (ref 0.00–0.07)
Basophils Absolute: 0 10*3/uL (ref 0.0–0.1)
Basophils Relative: 0 %
Eosinophils Absolute: 0 10*3/uL (ref 0.0–0.5)
Eosinophils Relative: 0 %
HCT: 37.8 % (ref 36.0–46.0)
Hemoglobin: 12.3 g/dL (ref 12.0–15.0)
Immature Granulocytes: 0 %
Lymphocytes Relative: 33 %
Lymphs Abs: 2 10*3/uL (ref 0.7–4.0)
MCH: 28.3 pg (ref 26.0–34.0)
MCHC: 32.5 g/dL (ref 30.0–36.0)
MCV: 86.9 fL (ref 80.0–100.0)
Monocytes Absolute: 0.5 10*3/uL (ref 0.1–1.0)
Monocytes Relative: 8 %
Neutro Abs: 3.6 10*3/uL (ref 1.7–7.7)
Neutrophils Relative %: 59 %
Platelets: 237 10*3/uL (ref 150–400)
RBC: 4.35 MIL/uL (ref 3.87–5.11)
RDW: 14.5 % (ref 11.5–15.5)
WBC: 6.1 10*3/uL (ref 4.0–10.5)
nRBC: 0 % (ref 0.0–0.2)

## 2020-04-17 LAB — COMPREHENSIVE METABOLIC PANEL
ALT: 20 U/L (ref 0–44)
AST: 21 U/L (ref 15–41)
Albumin: 2.3 g/dL — ABNORMAL LOW (ref 3.5–5.0)
Alkaline Phosphatase: 72 U/L (ref 38–126)
Anion gap: 13 (ref 5–15)
BUN: 46 mg/dL — ABNORMAL HIGH (ref 8–23)
CO2: 20 mmol/L — ABNORMAL LOW (ref 22–32)
Calcium: 8.5 mg/dL — ABNORMAL LOW (ref 8.9–10.3)
Chloride: 105 mmol/L (ref 98–111)
Creatinine, Ser: 1.83 mg/dL — ABNORMAL HIGH (ref 0.44–1.00)
GFR, Estimated: 31 mL/min — ABNORMAL LOW (ref >60)
Glucose, Bld: 210 mg/dL — ABNORMAL HIGH (ref 70–99)
Potassium: 4.6 mmol/L (ref 3.5–5.1)
Sodium: 138 mmol/L (ref 135–145)
Total Bilirubin: 0.4 mg/dL (ref 0.3–1.2)
Total Protein: 5.8 g/dL — ABNORMAL LOW (ref 6.5–8.1)

## 2020-04-17 LAB — PHOSPHORUS: Phosphorus: 2.7 mg/dL (ref 2.5–4.6)

## 2020-04-17 LAB — CULTURE, BLOOD (ROUTINE X 2)
Culture: NO GROWTH
Special Requests: ADEQUATE

## 2020-04-17 LAB — C-REACTIVE PROTEIN: CRP: 2.5 mg/dL — ABNORMAL HIGH (ref <1.0)

## 2020-04-17 LAB — D-DIMER, QUANTITATIVE: D-Dimer, Quant: 1.6 ug/mL-FEU — ABNORMAL HIGH (ref 0.00–0.50)

## 2020-04-17 LAB — MAGNESIUM: Magnesium: 2.1 mg/dL (ref 1.7–2.4)

## 2020-04-17 NOTE — TOC Progression Note (Addendum)
Transition of Care [facility]) - Progression Note    Patient Details  Name: [name] MRN: [id number] Date of Birth: 04-01-1956  Transition of Care [facility]) CM/SW [place], RN Phone Number: 04/17/2020, 12:56 PM  Clinical Narrative:    Damaris Schooner to MD and spoke to patient. Patient still weak and does not have 24/7 help,daughter can come after work.  but unlikely to get SNF bed. HH ordered , walker ordered for patient, reached out to CM at [facility] where husband is a patient to see if we can coordinate care for both patients. Patient asked if CM could assist her in providing a update about her husband. Contacted RN in charge of his care who will call Ms Mcwhirt with an update. Securechat with Ria Comment to coordinate care. of this couple.  Will reach out to PT for further recommendations.  1330 discussed with PT , they will re-evaluate patient today.  55 discussed with RNCM at [facility] in charge of husband Brilee Port. At this time,  Her husband is not discharging for a few days, thus pairing of the two for [facility] currently is precipitus. They are getting a PT consult on him today, so we will find out today what both of them are recommended for in continuum of care.  1454 appreciate PT input, patient is weak, vomited and orthostatic when attempted to ambulate.  Safe plan would be SNF placement, at this time with potentially  more additional hospital work up.   Expected Discharge Plan: [place] Barriers to Discharge: Family Issues  Expected Discharge Plan and Services Expected Discharge Plan: [place] In-house Referral: Clinical Social Work Discharge Planning Services: CM Consult                                           Social Determinants of Health (SDOH) Interventions    Readmission Risk Interventions No flowsheet data found.

## 2020-04-17 NOTE — Progress Notes (Signed)
Results for XYLINA, RHOADS (MRN [id number]) as of 04/17/2020 11:01  Ref. Range 04/13/2020 16:09 04/13/2020 21:08 04/14/2020 21:12 04/14/2020 21:47 04/15/2020 07:28  Glucose-Capillary Latest Ref Range: 70 - 99 mg/dL 382 (H) 297 (H) 67 (L) 114 (H) 74  Noted that patient had a low CBG last night of 67 mg/dl and 63 mg/dl this am.  Recommend decreasing Novolog correction scale to SENSITIVE TID & HS scale while in the hospital if blood sugars continue to be less than 70 mg/dl.   Harvel Ricks RN BSN CDE Diabetes Coordinator Pager: [phone]  8am-5pm

## 2020-04-17 NOTE — Progress Notes (Signed)
Physical Therapy Treatment Patient Details Name: [name] MRN: [id number] DOB: 1955-05-10 Today's Date: 04/17/2020    History of Present Illness Pt is a 64 y.o. female admitted 04/12/20 with acute hypoxic respiratory failure secondary to COVID-19, AKI on CKD 3. PMH includes DM2, COPD, CHF, PVD, MI, chronic LBP.   PT Comments    Pt not progressing with mobility. Was able to ambulate short distance with nursing staff this morning (noted no BP measurements taken during this). This session, pt limited by taking transfers and a few steps from recliner to [facility] to bed, pt with nausea/vomiting and dizziness; return to bed with BP down to 76/54 (MD aware). Bed reposition and arm pumps, BP eventually up to 111/66; educ pt on strategies to overcome orthostatic hypotension if this persists at home. Pt remains limited by hypotension, generalized weakness, decreased activity tolerance and impaired balance; at high risk for falls. Continue to recommend SNF-level therapies to maximize functional mobility and independence. If to return home, will require 24/7 assist initially.  SpO2 92% on RA, HR 72    Follow Up Recommendations  SNF;Supervision/Assistance - 24 hour     Equipment Recommendations  Wheelchair (measurements PT);Wheelchair cushion (measurements PT);3in1 (PT)    Recommendations for Other Services       Precautions / Restrictions Precautions Precautions: Fall;Other (comment) Precaution Comments: symptomatic with (+) orthostatic hypotension 12/16 and 12/17 Restrictions Weight Bearing Restrictions: No    Mobility  Bed Mobility Overal bed mobility: Needs Assistance Bed Mobility: Sit to Supine       Sit to supine: Min guard      Transfers Overall transfer level: Needs assistance Equipment used: 1 person hand held assist Transfers: Sit to/from Stand Sit to Stand: Min assist         General transfer comment: C/o dizziness,  nausea/vomiting  Ambulation/Gait Ambulation/Gait assistance: Min Web designer (Feet): 1 Feet Assistive device: 1 person hand held assist Gait Pattern/deviations: Step-to pattern;Shuffle;Antalgic Gait velocity: decreased   General Gait Details: Steps from [facility] to bed with minA for HHA to maintain balance; further mobility limited by hypotension, nausea/vomiting, dizziness   Stairs             Wheelchair Mobility    Modified Rankin (Stroke Patients Only)       Balance Overall balance assessment: Needs assistance Sitting-balance support: Feet supported Sitting balance-Leahy Scale: Fair     Standing balance support: Single extremity supported;During functional activity Standing balance-Leahy Scale: Poor                              Cognition Arousal/Alertness: Awake/alert Behavior During Therapy: WFL for tasks assessed/performed;Flat affect Overall Cognitive Status: Impaired/Different from baseline Area of Impairment: Attention;Following commands;Safety/judgement;Awareness;Problem solving                   Current Attention Level: Selective   Following Commands: Follows one step commands with increased time Safety/Judgement: Decreased awareness of safety;Decreased awareness of deficits Awareness: Emergent Problem Solving: Requires verbal cues General Comments: Likely exacerbated by feeling nauseous/dizzy and (+) orthostatic      Exercises      General Comments General comments (skin integrity, edema, etc.): SpO2 down to 76/54 sitting EOB after use of BSC and nausea/vomiting; up to 111/66 at end of session with bed repositioning and arm pumps      Pertinent Vitals/Pain Pain Assessment: Faces Faces Pain Scale: Hurts little more Pain Location: Generalized Pain Descriptors / Indicators: Discomfort;Tiring  Pain Intervention(s): Monitored during session;Limited activity within patient's tolerance    Home Living                       Prior Function            PT Goals (current goals can now be found in the care plan section) Progress towards PT goals: Not progressing toward goals - comment    Frequency    Min 3X/week      PT Plan Frequency needs to be updated    Co-evaluation              AM-PAC PT "6 Clicks" Mobility   Outcome Measure  Help needed turning from your back to your side while in a flat bed without using bedrails?: A Little Help needed moving from lying on your back to sitting on the side of a flat bed without using bedrails?: A Little Help needed moving to and from a bed to a chair (including a wheelchair)?: A Little Help needed standing up from a chair using your arms (e.g., wheelchair or bedside chair)?: A Little Help needed to walk in hospital room?: A Little Help needed climbing 3-5 steps with a railing? : A Lot 6 Click Score: 17    End of Session   Activity Tolerance: Treatment limited secondary to medical complications (Comment) Patient left: in bed;with call bell/phone within reach;with bed alarm set Nurse Communication: Mobility status PT Visit Diagnosis: Other abnormalities of gait and mobility (R26.89);Muscle weakness (generalized) (M62.81)     Time: 4734-0370 PT Time Calculation (min) (ACUTE ONLY): 9 min  Charges:  $Therapeutic Activity: 8-22 mins                     Mabeline Caras, PT, DPT Acute Rehabilitation Services  Pager [phone] Office [phone]  Derry Lory 04/17/2020, 4:28 PM

## 2020-04-17 NOTE — TOC Progression Note (Signed)
Transition of Care [facility]) - Progression Note    Patient Details  Name: [name] MRN: [id number] Date of Birth: [birth year]  Transition of Care [facility]) CM/SW [place], LCSW Phone Number: 04/17/2020, 3:49 PM  Clinical Narrative:    Patient declined by [facility], [facility] due to Medicaid and lack of bed availability. CSW not able to expand search to non-COVID SNFs until patient is 14 days past first COVID positive test.   Expected Discharge Plan: [place] Barriers to Discharge: Family Issues  Expected Discharge Plan and Services Expected Discharge Plan: [place] In-house Referral: Clinical Social Work Discharge Planning Services: CM Consult                     DME Arranged: Gilford Rile rolling DME Agency: [employer] Date DME Agency Contacted: 04/17/20 Time DME Agency Contacted: 70 Representative spoke with at DME Agency: [place] ([place]) Interventions    Readmission Risk Interventions No flowsheet data found.

## 2020-04-17 NOTE — Progress Notes (Signed)
HD#4 Subjective:  Overnight Events: None  This morning patient notes she continue to have fatigue when getting up and moving around. She denies any SOB at rest, nausea, vomiting, or diarrhea. Tolerating food well. Amenable to going to SNF, discussed that likely will be difficult to find a bed but will continue to work with PT and TOC.  Objective:  Vital signs in last 24 hours: Vitals:   04/16/20 0746 04/16/20 1145 04/16/20 2110 04/17/20 0443  BP: (!) 162/90 (!) 153/69 113/60 127/69  Pulse: 74 75 76 74  Resp: 17 17 19 18   Temp: 97.8 F (36.6 C) 98.6 F (37 C) 98.7 F (37.1 C) 97.8 F (36.6 C)  TempSrc:   Oral Oral  SpO2: 94% 96% 95% 92%  Weight:      Height:       Supplemental O2: Room air 96%  Physical Exam:  Physical Exam Constitutional:      Appearance: Normal appearance.  HENT:     Head: Normocephalic and atraumatic.  Eyes:     Extraocular Movements: Extraocular movements intact.     Pupils: Pupils are equal, round, and reactive to light.  Cardiovascular:     Rate and Rhythm: Normal rate.  Pulmonary:     Effort: Pulmonary effort is normal. No respiratory distress.     Breath sounds: No wheezing.     Comments: Mild expiratory wheezes Abdominal:     General: Abdomen is flat. Bowel sounds are normal.     Palpations: Abdomen is soft.  Musculoskeletal:     Cervical back: Normal range of motion.  Neurological:     Mental Status: She is alert.    Filed Weights   04/12/20 1745 04/13/20 1037  Weight: 77.1 kg 77 kg    Intake/Output Summary (Last 24 hours) at 04/17/2020 0541 Last data filed at 04/17/2020 0400 Gross per 24 hour  Intake 0 ml  Output 1100 ml  Net -1100 ml   Net IO Since Admission: -5,136.68 mL [04/17/20 0541]  Pertinent Labs: CBC Latest Ref Rng & Units 04/17/2020 04/16/2020 04/15/2020  WBC 4.0 - 10.5 K/uL 6.1 4.6 8.2  Hemoglobin 12.0 - 15.0 g/dL 12.3 12.5 11.4(L)  Hematocrit 36.0 - 46.0 % 37.8 38.1 35.9(L)  Platelets 150 - 400 K/uL 237  207 198    CMP Latest Ref Rng & Units 04/17/2020 04/16/2020 04/15/2020  Glucose 70 - 99 mg/dL 210(H) 308(H) 194(H)  BUN 8 - 23 mg/dL 46(H) 43(H) 49(H)  Creatinine 0.44 - 1.00 mg/dL 1.83(H) 1.72(H) 1.70(H)  Sodium 135 - 145 mmol/L 138 136 134(L)  Potassium 3.5 - 5.1 mmol/L 4.6 4.9 4.8  Chloride 98 - 111 mmol/L 105 105 107  CO2 22 - 32 mmol/L 20(L) 21(L) 18(L)  Calcium 8.9 - 10.3 mg/dL 8.5(L) 8.8(L) 8.4(L)  Total Protein 6.5 - 8.1 g/dL 5.8(L) 5.9(L) 5.4(L)  Total Bilirubin 0.3 - 1.2 mg/dL 0.4 0.8 0.6  Alkaline Phos 38 - 126 U/L 72 73 65  AST 15 - 41 U/L 21 25 26   ALT 0 - 44 U/L 20 20 18    CBG (last 3)  Recent Labs    04/14/20 2112 04/14/20 2147 04/15/20 0728  GLUCAP 67* 114* 74    Imaging: No results found.  Assessment/Plan:   Active Problems:   Chronic combined systolic and diastolic CHF, NYHA class 2 (HCC)   Type II diabetes mellitus with stage 3 chronic kidney disease ([place])   Acute respiratory failure with hypoxia ([place])   COVID-19  Patient Summary: [name] is a 64 year old with a past medical history of chronic combined systolic and diastolic heart failure(improved EF on last echo 08/2018), CKD stage III, and obesity who presented for cough and found to have Covid-19 pneumonia.   COVID-19 Patient making significant improvement.  She is able to stand and sit at the bed without oxygen. She did not get orthostatic either. She states that she has continued fatigue but otherwise has made significant improvements.  I will discuss with physical therapy today regarding home health needs for patient to be able to go home tomorrow. She has a daughter who can help her when she is discharged.  -PT recommendations for home health PT with DME supplies  AKI on CKD stage 3 Stable kidney function -Continue to monitor kidney function  Type 2 diabetes mellitus - continue home diabetes regimen  - Continue SSI  Chronic combined systolic and diastolic CHF On Losartan, Coreg,  and Imdur. Orthostatic hypotension when working with PT yesterday, given additional liter of fluid. CP in 160s this morning will restart home losartan. --no evidence of hypervolemia on exam --Continue coreg. Start losartan 100 mg daily - Hold imdur  Diet: Heart Healthy/Carb modified  VTE: Enoxaparin Code: Full  Dispo: Anticipated discharge to SNF in pending placement  Iona Beard, MD 04/17/2020, 5:41 AM Pager: [phone]  Please contact the on call pager after 5 pm and on weekends at [phone].

## 2020-04-17 NOTE — Progress Notes (Signed)
Occupational Therapy Treatment Patient Details Name: [name] MRN: [id number] DOB: [birth year] Today's Date: 04/17/2020    History of present illness Pt is a 64 y.o. female admitted 04/12/20 with acute hypoxic respiratory failure secondary to COVID-19, AKI on CKD 3. PMH includes DM2, COPD, CHF, PVD, MI, chronic LBP.   OT comments  Pt seen for OT follow up session with focus on ADL mobility progression. Per chart review there was mention of pt possibly being d/c due to lack of bed availability at SNF. On OT arrival to session, pt sitting on BSC and had just vomited a moderate amount. She reports feeling very weak with little energy. Pt requires total A for peri hygiene after BM. She then required min A to return back to EOB. BP +orthostasis (see below). Pt unable to further progress mobility at this time due to BP and n/v. MD and RN aware. Continue to recommend SNF as safest d/c plan. Will continue to follow.  BP in session: 76/54 once back to bed from [facility] 90/61 once supine 111/66 after ~3 mins supine.    Follow Up Recommendations  SNF    Equipment Recommendations  3 in 1 bedside commode;Wheelchair (measurements OT);Wheelchair cushion (measurements OT)    Recommendations for Other Services      Precautions / Restrictions Precautions Precautions: Fall;Other (comment) Precaution Comments: symptomatic with (+) orthostatic hypotension 12/16 and 12/17 Restrictions Weight Bearing Restrictions: No       Mobility Bed Mobility Overal bed mobility: Needs Assistance Bed Mobility: Sit to Supine       Sit to supine: Min guard      Transfers Overall transfer level: Needs assistance Equipment used: 1 person hand held assist Transfers: Sit to/from Stand Sit to Stand: Min assist         General transfer comment: C/o dizziness, nausea/vomiting    Balance Overall balance assessment: Needs assistance Sitting-balance support: Feet supported Sitting balance-Leahy Scale:  Fair     Standing balance support: Single extremity supported;During functional activity Standing balance-Leahy Scale: Poor Standing balance comment: required RW and min guard                           ADL either performed or assessed with clinical judgement   ADL Overall ADL's : Needs assistance/impaired                         Toilet Transfer: Min Statistician Details (indicate cue type and reason): close guarding for safety         Functional mobility during ADLs: Minimal assistance;Cueing for safety;Cueing for sequencing General ADL Comments: limited 2/2 orthostatic hypotension and n/v     Vision Patient Visual Report: No change from baseline     Perception     Praxis      Cognition Arousal/Alertness: Awake/alert Behavior During Therapy: WFL for tasks assessed/performed;Flat affect Overall Cognitive Status: Impaired/Different from baseline Area of Impairment: Attention;Following commands;Safety/judgement;Awareness;Problem solving                   Current Attention Level: Selective   Following Commands: Follows one step commands with increased time Safety/Judgement: Decreased awareness of safety;Decreased awareness of deficits Awareness: Emergent Problem Solving: Requires verbal cues General Comments: Likely exacerbated by feeling nauseous/dizzy and (+) orthostatic        Exercises     Shoulder Instructions       General Comments SpO2 down to 76/54 sitting  EOB after use of BSC and nausea/vomiting; up to 111/66 at end of session with bed repositioning and arm pumps    Pertinent Vitals/ Pain       Pain Assessment: Faces Faces Pain Scale: Hurts little more Pain Location: Generalized Pain Descriptors / Indicators: Discomfort;Tiring Pain Intervention(s): Limited activity within patient's tolerance;Monitored during session  Home Living                                          Prior  Functioning/Environment              Frequency  Min 3X/week        Progress Toward Goals  OT Goals(current goals can now be found in the care plan section)  Progress towards OT goals: Progressing toward goals  Acute Rehab OT Goals Patient Stated Goal: Wants post-acute rehab at SNF to regain strength and independence OT Goal Formulation: With patient Time For Goal Achievement: 04/29/20 Potential to Achieve Goals: Good  Plan Discharge plan remains appropriate    Co-evaluation      Reason for Co-Treatment: For patient/therapist safety;To address functional/ADL transfers   OT goals addressed during session: ADL's and self-care;Proper use of Adaptive equipment and DME;Other (comment) (orthostatic)      AM-PAC OT "6 Clicks" Daily Activity     Outcome Measure   Help from another person eating meals?: A Little Help from another person taking care of personal grooming?: A Little Help from another person toileting, which includes using toliet, bedpan, or urinal?: A Little Help from another person bathing (including washing, rinsing, drying)?: A Lot Help from another person to put on and taking off regular upper body clothing?: A Little Help from another person to put on and taking off regular lower body clothing?: A Lot 6 Click Score: 16    End of Session    OT Visit Diagnosis: Other abnormalities of gait and mobility (R26.89);Unsteadiness on feet (R26.81);Muscle weakness (generalized) (M62.81)   Activity Tolerance Treatment limited secondary to medical complications (Comment) (+orthostatic hypotension; n/v)   Patient Left in bed;with call bell/phone within reach   Nurse Communication Mobility status;Other (comment) (BP; n/v)        Time: 0762-2633 OT Time Calculation (min): 9 min  Charges: OT General Charges $OT Visit: 1 Visit OT Treatments $Self Care/Home Management : 8-22 mins  Zenovia Jarred, MSOT, OTR/L [facility] Office Number:  [phone] Pager: [phone]  Zenovia Jarred 04/17/2020, 5:34 PM

## 2020-04-18 DIAGNOSIS — U071 COVID-19: Secondary | ICD-10-CM | POA: Diagnosis not present

## 2020-04-18 LAB — COMPREHENSIVE METABOLIC PANEL
ALT: 17 U/L (ref 0–44)
AST: 21 U/L (ref 15–41)
Albumin: 2.3 g/dL — ABNORMAL LOW (ref 3.5–5.0)
Alkaline Phosphatase: 72 U/L (ref 38–126)
Anion gap: 8 (ref 5–15)
BUN: 47 mg/dL — ABNORMAL HIGH (ref 8–23)
CO2: 23 mmol/L (ref 22–32)
Calcium: 8.4 mg/dL — ABNORMAL LOW (ref 8.9–10.3)
Chloride: 107 mmol/L (ref 98–111)
Creatinine, Ser: 1.69 mg/dL — ABNORMAL HIGH (ref 0.44–1.00)
GFR, Estimated: 34 mL/min — ABNORMAL LOW (ref >60)
Glucose, Bld: 144 mg/dL — ABNORMAL HIGH (ref 70–99)
Potassium: 4.6 mmol/L (ref 3.5–5.1)
Sodium: 138 mmol/L (ref 135–145)
Total Bilirubin: 0.5 mg/dL (ref 0.3–1.2)
Total Protein: 5.9 g/dL — ABNORMAL LOW (ref 6.5–8.1)

## 2020-04-18 LAB — CBC WITH DIFFERENTIAL/PLATELET
Abs Immature Granulocytes: 0.03 10*3/uL (ref 0.00–0.07)
Basophils Absolute: 0 10*3/uL (ref 0.0–0.1)
Basophils Relative: 0 %
Eosinophils Absolute: 0 10*3/uL (ref 0.0–0.5)
Eosinophils Relative: 0 %
HCT: 37.6 % (ref 36.0–46.0)
Hemoglobin: 12.2 g/dL (ref 12.0–15.0)
Immature Granulocytes: 1 %
Lymphocytes Relative: 33 %
Lymphs Abs: 1.8 10*3/uL (ref 0.7–4.0)
MCH: 28.1 pg (ref 26.0–34.0)
MCHC: 32.4 g/dL (ref 30.0–36.0)
MCV: 86.6 fL (ref 80.0–100.0)
Monocytes Absolute: 0.6 10*3/uL (ref 0.1–1.0)
Monocytes Relative: 10 %
Neutro Abs: 3.1 10*3/uL (ref 1.7–7.7)
Neutrophils Relative %: 56 %
Platelets: 222 10*3/uL (ref 150–400)
RBC: 4.34 MIL/uL (ref 3.87–5.11)
RDW: 14.5 % (ref 11.5–15.5)
WBC: 5.5 10*3/uL (ref 4.0–10.5)
nRBC: 0 % (ref 0.0–0.2)

## 2020-04-18 LAB — D-DIMER, QUANTITATIVE: D-Dimer, Quant: 1.54 ug/mL-FEU — ABNORMAL HIGH (ref 0.00–0.50)

## 2020-04-18 LAB — CULTURE, BLOOD (ROUTINE X 2): Culture: NO GROWTH

## 2020-04-18 LAB — PHOSPHORUS: Phosphorus: 3.4 mg/dL (ref 2.5–4.6)

## 2020-04-18 LAB — C-REACTIVE PROTEIN: CRP: 5.6 mg/dL — ABNORMAL HIGH (ref <1.0)

## 2020-04-18 LAB — MAGNESIUM: Magnesium: 1.9 mg/dL (ref 1.7–2.4)

## 2020-04-18 MED ORDER — POLYETHYLENE GLYCOL 3350 17 G PO PACK: 17.0000 g | PACK | Freq: Every day | ORAL | Status: DC | PRN

## 2020-04-18 MED ORDER — SENNA 8.6 MG PO TABS: 2.0000 | ORAL_TABLET | Freq: Every day | ORAL | Status: DC

## 2020-04-18 MED ORDER — SENNOSIDES-DOCUSATE SODIUM 8.6-50 MG PO TABS: 2.0000 | ORAL_TABLET | Freq: Every evening | ORAL | Status: DC | PRN

## 2020-04-18 NOTE — Progress Notes (Signed)
Physical Therapy Treatment Patient Details Name: [name] MRN: [id number] DOB: May 31, 1955 Today's Date: 04/18/2020    History of Present Illness Pt is a 64 y.o. female admitted 04/12/20 with acute hypoxic respiratory failure secondary to COVID-19, AKI on CKD 3. PMH includes DM2, COPD, CHF, PVD, MI, chronic LBP.    PT Comments    Pt tolerated therapy very well today. Used BSC prior to ambulation for urination and BM. Then ambulated 150' with RW and min-guard A that progressed to min A last 50' due to fatigue and R hip discomfort. Pt BP before session 141/79, after 141/64. Pt did not experience dizziness today during treatment. SpO2 remained in 90's on RA and HR remained in 70's and 80's bpm. PT will continue to follow.   Follow Up Recommendations  Supervision/Assistance - 24 hour;Home health PT     Equipment Recommendations  Wheelchair (measurements PT);Wheelchair cushion (measurements PT);3in1 (PT)    Recommendations for Other Services       Precautions / Restrictions Precautions Precautions: Fall;Other (comment) Precaution Comments: symptomatic with (+) orthostatic hypotension 12/16 and 12/17 Restrictions Weight Bearing Restrictions: No    Mobility  Bed Mobility Overal bed mobility: Needs Assistance Bed Mobility: Sit to Supine       Sit to supine: Supervision   General bed mobility comments: pt able to return sit>supine without physical assist  Transfers Overall transfer level: Needs assistance Equipment used: Rolling walker (2 wheeled) Transfers: Sit to/from Stand Sit to Stand: Min guard         General transfer comment: min-guard from recliner, bed, and BSC. Performed >3 times and pt able to perform without physical assist each time  Ambulation/Gait Ambulation/Gait assistance: Min assist;Min guard Gait Distance (Feet): 150 Feet Assistive device: Rolling walker (2 wheeled) Gait Pattern/deviations: Shuffle;Antalgic;Step-through pattern;Decreased stride  length Gait velocity: decreased Gait velocity interpretation: <1.31 ft/sec, indicative of household ambulator General Gait Details: pt ambulates with antalgic pattern that she reports is due to R hip OA. min-guard with initial ambulation, min A needed last 50' due to fatigue. Pt drifts R with distance   Stairs             Wheelchair Mobility    Modified Rankin (Stroke Patients Only)       Balance Overall balance assessment: Needs assistance Sitting-balance support: Feet supported Sitting balance-Leahy Scale: Good     Standing balance support: Single extremity supported;During functional activity Standing balance-Leahy Scale: Poor Standing balance comment: required RW and min guard                            Cognition Arousal/Alertness: Awake/alert Behavior During Therapy: WFL for tasks assessed/performed;Flat affect Overall Cognitive Status: Impaired/Different from baseline Area of Impairment: Following commands;Safety/judgement;Awareness;Problem solving                   Current Attention Level: Selective   Following Commands: Follows multi-step commands with increased time Safety/Judgement: Decreased awareness of safety;Decreased awareness of deficits Awareness: Emergent Problem Solving: Requires verbal cues General Comments: pt appropriate during treatment today and was able to verbalize how she was feeling throughout session      Exercises      General Comments General comments (skin integrity, edema, etc.): BP 141/79 before treatment, 141/64 after BM and ambulation. HR 77 bpm, 81 bpm after ambulation. SPO2 on RA 95%, 96% after ambulation      Pertinent Vitals/Pain Pain Assessment: No/denies pain    Home Living  Prior Function            PT Goals (current goals can now be found in the care plan section) Acute Rehab PT Goals Patient Stated Goal: get stronger PT Goal Formulation: With patient Time For  Goal Achievement: 04/29/20 Potential to Achieve Goals: Good Progress towards PT goals: Progressing toward goals    Frequency    Min 3X/week      PT Plan Discharge plan needs to be updated    Co-evaluation              AM-PAC PT "6 Clicks" Mobility   Outcome Measure  Help needed turning from your back to your side while in a flat bed without using bedrails?: None Help needed moving from lying on your back to sitting on the side of a flat bed without using bedrails?: None Help needed moving to and from a bed to a chair (including a wheelchair)?: A Little Help needed standing up from a chair using your arms (e.g., wheelchair or bedside chair)?: A Little Help needed to walk in hospital room?: A Little Help needed climbing 3-5 steps with a railing? : A Lot 6 Click Score: 19    End of Session Equipment Utilized During Treatment: Gait belt Activity Tolerance: Patient tolerated treatment well Patient left: in bed;with call bell/phone within reach Nurse Communication: Mobility status PT Visit Diagnosis: Other abnormalities of gait and mobility (R26.89);Muscle weakness (generalized) (M62.81)     Time: 5956-3875 PT Time Calculation (min) (ACUTE ONLY): 32 min  Charges:  $Gait Training: 23-37 mins                     Leighton Roach, [place]  Pager [phone] Office [place] 04/18/2020, 2:48 PM

## 2020-04-18 NOTE — Progress Notes (Signed)
HD#5 Subjective:  Overnight Events: No overnight events  I spoke today with the patient at length regarding her functional status.  The patient states that she feels as though she is made significant improvements.  She has been able to get up and walk around the room with assistance.  I spoke to her regarding my concern for orthostatic symptoms.  She states that she does not get lightheaded or dizzy when she stands or walks.  She does admit to feeling warm, dizzy, and lightheaded after having a bowel movement.  She states that she does not feel this way when walking.  She admits that she does want to go home and finish her recovery there.  I expressed the concerns of the physical therapy staff.  She understood these concerns but once again stated that she does not get lightheaded or dizzy when standing or walking only using the bathroom.  Otherwise the patient denies any chest pain, shortness of breath, abdominal pain.  Her appetite is good.  She is saturating well on room air.  Objective:  Vital signs in last 24 hours: Vitals:   04/17/20 1456 04/17/20 1953 04/18/20 0033 04/18/20 0406  BP: 111/66 (!) 113/50 (!) 148/87 (!) 115/58  Pulse: 70 73 79 79  Resp: 18 (!) 22 (!) 24 18  Temp: 98.2 F (36.8 C) 98.8 F (37.1 C) 98.9 F (37.2 C) 99.2 F (37.3 C)  TempSrc:  Oral Oral Oral  SpO2: 94% 94% 92% 93%  Weight:      Height:       Supplemental O2: Room Air SpO2: 93 %    Physical Exam:  Physical Exam Constitutional:      Appearance: Normal appearance.  HENT:     Head: Normocephalic and atraumatic.  Eyes:     Extraocular Movements: Extraocular movements intact.  Cardiovascular:     Rate and Rhythm: Normal rate.     Pulses: Normal pulses.     Heart sounds: Normal heart sounds.  Pulmonary:     Effort: Pulmonary effort is normal.     Breath sounds: Normal breath sounds.  Abdominal:     General: Bowel sounds are normal.     Palpations: Abdomen is soft.     Tenderness: There is  no abdominal tenderness.  Musculoskeletal:        General: Normal range of motion.     Cervical back: Normal range of motion.     Right lower leg: No edema.     Left lower leg: No edema.  Skin:    General: Skin is warm and dry.  Neurological:     Mental Status: She is alert and oriented to person, place, and time. Mental status is at baseline.  Psychiatric:        Mood and Affect: Mood normal.     Filed Weights   04/12/20 1745 04/13/20 1037  Weight: 77.1 kg 77 kg     Intake/Output Summary (Last 24 hours) at 04/18/2020 0609 Last data filed at 04/18/2020 0035 Gross per 24 hour  Intake --  Output 700 ml  Net -700 ml   Net IO Since Admission: -6,286.68 mL [04/18/20 0609]  Pertinent Labs: CBC Latest Ref Rng & Units 04/18/2020 04/17/2020 04/16/2020  WBC 4.0 - 10.5 K/uL 5.5 6.1 4.6  Hemoglobin 12.0 - 15.0 g/dL 12.2 12.3 12.5  Hematocrit 36.0 - 46.0 % 37.6 37.8 38.1  Platelets 150 - 400 K/uL 222 237 207    CMP Latest Ref Rng & Units 04/18/2020  04/17/2020 04/16/2020  Glucose 70 - 99 mg/dL 144(H) 210(H) 308(H)  BUN 8 - 23 mg/dL 47(H) 46(H) 43(H)  Creatinine 0.44 - 1.00 mg/dL 1.69(H) 1.83(H) 1.72(H)  Sodium 135 - 145 mmol/L 138 138 136  Potassium 3.5 - 5.1 mmol/L 4.6 4.6 4.9  Chloride 98 - 111 mmol/L 107 105 105  CO2 22 - 32 mmol/L 23 20(L) 21(L)  Calcium 8.9 - 10.3 mg/dL 8.4(L) 8.5(L) 8.8(L)  Total Protein 6.5 - 8.1 g/dL 5.9(L) 5.8(L) 5.9(L)  Total Bilirubin 0.3 - 1.2 mg/dL 0.5 0.4 0.8  Alkaline Phos 38 - 126 U/L 72 72 73  AST 15 - 41 U/L 21 21 25   ALT 0 - 44 U/L 17 20 20     Imaging: No results found.  Assessment/Plan:   Active Problems:   Chronic combined systolic and diastolic CHF, NYHA class 2 (HCC)   Type II diabetes mellitus with stage 3 chronic kidney disease (HCC)   Acute respiratory failure with hypoxia ([place])   COVID-19   Patient Summary: [name] a 64 year old with a past medical history of chronic combined systolic and diastolic heart  failure(improved EF on last echo 08/2018), CKD stage III, and obesitywhopresented for cough and found to have Covid-19 pneumonia.  COVID-19 Patient is on room air.  She is finished her course of glucocorticoids and remdesivir.  She is making significant strides with strength.  From a Covid standpoint the patient is ready for discharge.  Reflex syncope: Physical therapy have raised concerns regarding orthostatic hypotension particularly with standing and walking.  I spoke to the patient regarding this concern at length today.  She states that she only becomes dizzy and feels like she will pass out after she has a bowel movement.  She expresses feeling warm/flushed, dizzy and shaky after having a bowel movement and denies having these feelings at any other time.  Furthermore, I asked the patient to stand yesterday and she had good strength in both of her extremities.  Her balance was a little bit off but I suspect that this will improve particularly if she ambulates with a walker.  Considering the patient was deemed unsafe for discharge based on her orthostasis, this new information leads me to believe that she is actually experiencing vasovagal symptoms secondary to defecation which would account for her hypertension.  Otherwise she has been normotensive with good p.o. intake.  I will repeat orthostatic vitals today.  This should be performed after the patient has been able to rest comfortably. -We will repeat orthostatic vitals today -We will get patient on a bowel regimen with Senokot and MiraLAX as needed to decrease straining with bowel movements. -Continue PT OT for strength -Knee-high compression stockings   AKI on CKD stage 3 Stable kidney function -Continue to monitor kidney function  Type 2 diabetes mellitus - continue home diabetes regimen  - Continue SSI  Chronic combined systolic and diastolic CHF On Losartan, Coreg, and Imdur. Orthostatic hypotension when working with PT  yesterday, given additional liter of fluid. CP in 160s this morning will restart home losartan. --no evidence of hypervolemia on exam --Continue coreg. Start losartan 100 mg daily - Hold imdur  Diet: Heart Healthy IVF: none VTE: Enoxaparin Code: Full PT/OT recs: Pending ID:     Dispo: Anticipated discharge to Home pending improvement of strength.  Lawerance Cruel, D.O.  Internal Medicine Resident, PGY-2 [facility]  Pager: [phone] 6:09 AM, 04/18/2020   Please contact the on call pager after 5 pm and  on weekends at [phone].

## 2020-04-19 NOTE — Progress Notes (Signed)
HD#6 Subjective:  Overnight Events: no events overnight.   Patient resting flat in bed and appears comfortable. She denies any symptoms today such as chest pain, shortness of breath, abdominal pain, or weakness. Her fatigue is improving and she states that she is getting stronger. She worked with PT yesterday and was able to walk with a walker and did not have any dizziness or presyncopal symptoms. She continues to express the desire to go home.   Objective:  Vital signs in last 24 hours: Vitals:   04/18/20 0406 04/18/20 1421 04/18/20 2143 04/19/20 0500  BP: (!) 115/58 127/72 103/62 (!) 153/68  Pulse: 79 78 77 75  Resp: 18 18 15  (!) 23  Temp: 99.2 F (37.3 C) 98.1 F (36.7 C) 98.7 F (37.1 C) 98.7 F (37.1 C)  TempSrc: Oral Oral Oral Oral  SpO2: 93% 93% 94% 95%  Weight:      Height:       Supplemental O2: Room Air SpO2: 95 % O2 Flow Rate (L/min): none   Physical Exam:  Physical Exam Constitutional:      Appearance: Normal appearance.  HENT:     Head: Normocephalic and atraumatic.  Eyes:     Extraocular Movements: Extraocular movements intact.  Cardiovascular:     Rate and Rhythm: Normal rate.     Pulses: Normal pulses.     Heart sounds: Normal heart sounds.  Pulmonary:     Effort: Pulmonary effort is normal.     Breath sounds: Normal breath sounds.  Abdominal:     General: Bowel sounds are normal.     Palpations: Abdomen is soft.     Tenderness: There is no abdominal tenderness.  Musculoskeletal:        General: Normal range of motion.     Cervical back: Normal range of motion.     Right lower leg: No edema.     Left lower leg: No edema.  Skin:    General: Skin is warm and dry.  Neurological:     Mental Status: She is alert and oriented to person, place, and time. Mental status is at baseline.  Psychiatric:        Mood and Affect: Mood normal.     Filed Weights   04/12/20 1745 04/13/20 1037  Weight: 77.1 kg 77 kg     Intake/Output Summary (Last  24 hours) at 04/19/2020 0847 Last data filed at 04/18/2020 2200 Gross per 24 hour  Intake 240 ml  Output 100 ml  Net 140 ml   Net IO Since Admission: -6,146.68 mL [04/19/20 0847]  No results for input(s): GLUCAP in the last 72 hours.   Pertinent Labs: CBC Latest Ref Rng & Units 04/18/2020 04/17/2020 04/16/2020  WBC 4.0 - 10.5 K/uL 5.5 6.1 4.6  Hemoglobin 12.0 - 15.0 g/dL 12.2 12.3 12.5  Hematocrit 36.0 - 46.0 % 37.6 37.8 38.1  Platelets 150 - 400 K/uL 222 237 207    CMP Latest Ref Rng & Units 04/18/2020 04/17/2020 04/16/2020  Glucose 70 - 99 mg/dL 144(H) 210(H) 308(H)  BUN 8 - 23 mg/dL 47(H) 46(H) 43(H)  Creatinine 0.44 - 1.00 mg/dL 1.69(H) 1.83(H) 1.72(H)  Sodium 135 - 145 mmol/L 138 138 136  Potassium 3.5 - 5.1 mmol/L 4.6 4.6 4.9  Chloride 98 - 111 mmol/L 107 105 105  CO2 22 - 32 mmol/L 23 20(L) 21(L)  Calcium 8.9 - 10.3 mg/dL 8.4(L) 8.5(L) 8.8(L)  Total Protein 6.5 - 8.1 g/dL 5.9(L) 5.8(L) 5.9(L)  Total Bilirubin  0.3 - 1.2 mg/dL 0.5 0.4 0.8  Alkaline Phos 38 - 126 U/L 72 72 73  AST 15 - 41 U/L 21 21 25   ALT 0 - 44 U/L 17 20 20     Imaging: No results found.  Assessment/Plan:   Active Problems:   Chronic combined systolic and diastolic CHF, NYHA class 2 (HCC)   Type II diabetes mellitus with stage 3 chronic kidney disease (HCC)   Acute respiratory failure with hypoxia ([place])   COVID-19   Patient Summary: [name] a 64 year old with a past medical history of chronic combined systolic and diastolic heart failure(improved EF on last echo 08/2018), CKD stage III, and obesitywhopresented for cough and found to have Covid-19 pneumonia.  COVID-19 - On room air and saturating well - First positive test was on 04/12/20 - Isolation until 05/02/20  Reflex presyncope: She denies any additional episodes of presyncopal symptoms.  - Continue compression stockings - Continue bowel regimen of senokot and miralax prn - continue PT  - Repeat Orthostatics  AKI  on CKD stage 3 Stable kidney function -Continue to monitor kidney function  Type 2 diabetes mellitus - continue home diabetes regimen  - Continue SSI  Chronic combined systolic and diastolic CHF Currently on Losartan 100 mg and Coreg 12.5 mg BID. Does have elevated BP today, but will hold off on restarting  imdur as she has primarily been normotensive with concern for orthostasis  --no evidence of hypervolemia on exam --Continue coreg. Start losartan 100 mg daily - Hold imdur  Diastolic CHF: - hold of on lasix has patient is down 6 L since admission due to poor po intake. No signs of hypervolemia.   Deconditioning: Generalized weakness: PT recommend SNF for subacute PT. I have evaluated the patient and she is able to ambulate with a walk. She also has support at home via her daughter who can assist with groceries. Considering she will not be able to get a bed at SNF or CIR for many days due to her COVID status, I am hopeful to discharge her home safely.  - I will call her daughter to make arrangements - DME walker and bedside commode - I will order HH/PT - Consider DC today or tomorrow   Diet: Cardiac diet IVF: PO intake VTE: enoxaparin (LOVENOX) injection 40 mg Start: 04/16/20 1200 Code: Full PT/OT: SNF for Subacute PT  ID: none  Anticipated discharge to Home today pending coordination of home resources.  Lawerance Cruel, D.O.  Internal Medicine Resident, PGY-2 [facility]  Pager: [phone] 8:47 AM, 04/19/2020   Please contact the on call pager after 5 pm and on weekends at [phone].

## 2020-04-20 DIAGNOSIS — U071 COVID-19: Secondary | ICD-10-CM | POA: Diagnosis not present

## 2020-04-20 LAB — CREATININE, SERUM
Creatinine, Ser: 2.01 mg/dL — ABNORMAL HIGH (ref 0.44–1.00)
GFR, Estimated: 27 mL/min — ABNORMAL LOW (ref >60)

## 2020-04-20 MED ORDER — ENOXAPARIN SODIUM 30 MG/0.3ML ~~LOC~~ SOLN: 30.0000 mg | SUBCUTANEOUS | Status: DC

## 2020-04-20 MED ORDER — BLOOD GLUCOSE MONITOR KIT: PACK | 0 refills | Status: DC

## 2020-04-20 NOTE — Discharge Summary (Signed)
Name: [name] MRN: [id number] DOB: 02/15/1956 64 y.o. PCP: Marty Heck, DO  Date of Admission: 04/12/2020  5:31 PM Date of Discharge: 04/20/2020 Attending Physician: Angelica Pou, MD  Discharge Diagnosis: 1. COVID-19 pneumonia 2. Vasovagal pre-syncope 2/2 orthostatic hypotension 3. AKI on CKD IIIb 4. Type 2 diabetes mellitus 5. Combined systolic and diastolic heart failure  Discharge Medications: Allergies as of 04/20/2020      Reactions   Lisinopril Cough   Plavix [clopidogrel Bisulfate] Rash      Medication List    STOP taking these medications   isosorbide mononitrate 120 MG 24 hr tablet Commonly known as: IMDUR     TAKE these medications   Accu-Chek FastClix Lancets Misc Check blood sugar up to  3x/day before meals   Accu-Chek Guide test strip Generic drug: glucose blood CHECK BLOOD SUGAR UP TO 3X/DAY BEFORE MEALS   Accu-Chek Guide w/Device Kit 1 each by Does not apply route 3 (three) times daily.   acetaminophen 500 MG tablet Commonly known as: TYLENOL Take 1,000 mg by mouth every 6 (six) hours as needed for mild pain, fever or headache.   albuterol 108 (90 Base) MCG/ACT inhaler Commonly known as: Proventil HFA Inhale 2 puffs into the lungs every 6 (six) hours as needed for wheezing or shortness of breath.   amLODipine 5 MG tablet Commonly known as: NORVASC Take 1 tablet (5 mg total) by mouth daily.   aspirin EC 81 MG tablet Take 1 tablet (81 mg total) by mouth daily. IM program, [facility]   atorvastatin 40 MG tablet Commonly known as: LIPITOR TAKE 1 TABLET (40 MG TOTAL) BY MOUTH DAILY.   blood glucose meter kit and supplies Kit Dispense based on patient and insurance preference. Use up to four times daily as directed. (FOR ICD-9 250.00, 250.01). What changed: Another medication with the same name was added. Make sure you understand how and when to take each.   blood glucose meter kit and supplies Kit Dispense based on  patient and insurance preference. Use up to four times daily as directed. (FOR ICD-9 250.00, 250.01). What changed: You were already taking a medication with the same name, and this prescription was added. Make sure you understand how and when to take each.   carvedilol 12.5 MG tablet Commonly known as: COREG TAKE 1 TABLET (12.5 MG TOTAL) BY MOUTH 2 (TWO) TIMES DAILY WITH A MEAL.   empagliflozin 25 MG Tabs tablet Commonly known as: Jardiance Take 1 tablet (25 mg total) by mouth daily.   furosemide 20 MG tablet Commonly known as: LASIX Take 1 tablet (20 mg total) by mouth daily.   losartan 100 MG tablet Commonly known as: COZAAR Take 1 tablet (100 mg total) by mouth daily.   metFORMIN 500 MG 24 hr tablet Commonly known as: GLUCOPHAGE-XR Take 4 tablets (2,000 mg total) by mouth daily with breakfast.   Multivitamin Gummies Adult Chew Chew 2 tablets by mouth daily.   nitroGLYCERIN 0.4 MG SL tablet Commonly known as: NITROSTAT Place 1 tablet (0.4 mg total) under the tongue every 5 (five) minutes as needed for chest pain.   omeprazole 20 MG capsule Commonly known as: PRILOSEC TAKE 1 CAPSULE (20 MG TOTAL) BY MOUTH DAILY.   tiZANidine 4 MG tablet Commonly known as: Zanaflex Take 1 tablet (4 mg total) by mouth every 6 (six) hours as needed for muscle spasms.   Trulicity 9.24 QA/8.3MH Sopn Generic drug: Dulaglutide Inject 0.75 mg into the skin once a  week.   Unifine Pentips 31G X 5 MM Misc Generic drug: Insulin Pen Needle USE TO INJECT 20 UNITS DAILY   Vitamin D3 125 MCG (5000 UT) Caps Take 5,000 Units by mouth daily.            Durable Medical Equipment  (From admission, onward)         Start     Ordered   04/20/20 1230  For home use only DME lightweight manual wheelchair with seat cushion  Once       Comments: Patient suffers from weakness which impairs their ability to perform daily activities like bathing, ambulating in the home.  A walker will not resolve   issue with performing activities of daily living. A wheelchair will allow patient to safely perform daily activities. Patient is not able to propel themselves in the home using a standard weight wheelchair due to weakness. Patient can self propel in the lightweight wheelchair. Length of need 6 months. Accessories: elevating leg rests (ELRs), wheel locks, extensions and anti-tippers.   04/20/20 1231   04/19/20 1142  For home use only DME Bedside commode  Once       Question:  Patient needs a bedside commode to treat with the following condition  Answer:  Physical deconditioning   04/19/20 1142   04/17/20 1239  For home use only DME Walker rolling  Once       Question Answer Comment  Walker: With [place]   Patient needs a walker to treat with the following condition Weakness      04/17/20 1240          Disposition and follow-up:   [name] was discharged from [facility] in Stable condition.  At the hospital follow up visit please address:  1.  COVID-19 PNA: At discharge, patient sating well on RA, s/p remdesivir, steroids. Patient to isolate until 05/02/2020. Home Health PT ordered for continued strength building.  2. Combined systolic/diastolic HF: Patient euvolemic, normotensive at discharge. Re-starting home amlodipine. Holding Imdur given her pre-syncopal episodes in hospital. Can re-evaluate during clinic appt.  2.  Labs / imaging needed at time of follow-up: CBC, CMP  3.  Pending labs/ test needing follow-up: n/a  Follow-up Appointments:    Follow-up Information    Seawell, Jaimie A, DO. Go on 05/07/2020.   Specialty: Internal Medicine Why: You have an appointment scheduled on May 07, 2020 at 1:45 PM Contact information: [address] [address] [phone]        [name], MD .   Specialty: Cardiology Contact information: [phone] [address] [place] by problem list: 1. COVID-19 pneumonia: Patient arrived to [facility] with CC of cough. CXR revealed bilateral patchy opacities. She originally required 2L supplemental oxygen and was started on remdesivir, decadron. Patient continued to do well throughout her stay, completing three days of decadron, 5 days remdesivir. She was able to wean off supplemental oxygen, sating well on room air. PT/OT evaluated patient, recommended SNF. Patient preferred going home instead, as she has the support of her daughter 24/7. Patient was discharged 04/20/2020, sating well with RA, ambulating. Home Health PT ordered w/ DME wheelchair, bedside commode. She will remain in isolation until 05/02/20 and follow-up in clinic on 05/07/20.  2. Vasovagal pre-syncope 2/2 orthostatic hypotension: On 04/17/20, patient endorsing dizziness with physical therapy. Upon  further investigation the next morning, patient reported feeling warm, dizzy, and lightheaded after having a bowel movement, but does not endorse these symptoms while walking. Throughout her stay she reported good po intake. Symptoms thought to be 2/2 vasovagal symptoms due to constipation. She was started on bowel regimen and did not experience any further episodes. Will continue to hold Imdur until clinic visit given these symptoms.  3. AKI on CKD IIIb: Baseline Cr 1.7-2.0 throughout the last year. On arrival, Cr 2.4. This improved with fluids. At discharge, Cr 1.69.   4. Type 2 diabetes mellitus: A1c 8.9 in October. As she required steroids for COVID-19 pneumonia, experienced some hyperglycemia at the beginning of her hospitalization. This resolved and she was discharged on her home medications (metformin, Trulicity, Jardiance).  5. Combined systolic and diastolic heart failure: Throughout her stay, patient remained euvolemic. Held losartan, Imdur during most of her hospitalization given acute kidney infection. Continued to hold Imdur at discharge given  pre-syncopal episodes. Her blood pressure remained normotensive off of Imdur. Will re-evaluate at her clinic visit on 05/07/20.  Discharge Vitals:   BP (!) 150/73 (BP Location: Left Wrist)   Pulse 72   Temp 98.9 F (37.2 C) (Oral)   Resp 18   Ht '5\' 3"'  (1.6 m)   Wt 77 kg   SpO2 96%   BMI 30.08 kg/m   Pertinent Labs, Studies, and Procedures:  CBC Latest Ref Rng & Units 04/18/2020 04/17/2020 04/16/2020  WBC 4.0 - 10.5 K/uL 5.5 6.1 4.6  Hemoglobin 12.0 - 15.0 g/dL 12.2 12.3 12.5  Hematocrit 36.0 - 46.0 % 37.6 37.8 38.1  Platelets 150 - 400 K/uL 222 237 207   BMP Latest Ref Rng & Units 04/20/2020 04/18/2020 04/17/2020  Glucose 70 - 99 mg/dL - 144(H) 210(H)  BUN 8 - 23 mg/dL - 47(H) 46(H)  Creatinine 0.44 - 1.00 mg/dL 2.01(H) 1.69(H) 1.83(H)  BUN/Creat Ratio 12 - 28 - - -  Sodium 135 - 145 mmol/L - 138 138  Potassium 3.5 - 5.1 mmol/L - 4.6 4.6  Chloride 98 - 111 mmol/L - 107 105  CO2 22 - 32 mmol/L - 23 20(L)  Calcium 8.9 - 10.3 mg/dL - 8.4(L) 8.5(L)   CXR 04/12/2020 IMPRESSION: Patchy airspace opacities in both lower lung zones and right upper lobe, suspicious for pneumonia in the setting of COVID-19.  Discharge Instructions:   [name],  I am so glad you are feeling better! You were admitted to the hospital due to COVID-19 pneumonia. Thankfully, you are now no longer requiring oxygen and have significantly improved since you arrived! You are now medically stable and ready for discharge. Please see the following notes:   - Given your COVID-19 diagnosis, you must stay in isolation until January 1st, 2022. Please make sure that during isolation you are not in the same room with family or friends, as they could be exposed to the virus as well.   - We are changing one of your home medications:  - STOP taking IMDUR (isosorbide mononitrate). We are temporarily stopping this medication because you were having some episodes of dizziness. This medication can make you dizzy. We will  discuss if you need to re-start this medication during your clinic visit.   - We would like to see you in the clinic once your isolation period ends. I have scheduled that appointment for May 07, 2020 at 1:45PM.   - Since you have been weak due to this virus, we have ordered physical therapy to  come to your house. It will be important to follow their instructions and participate in order for you to gain your strength back!   It was a pleasure meeting you, [name]. I wish you the best and hope you have a healthy, happy, and safe holiday season.   Thank you,  Sanjuan Dame, MD  Signed: Sanjuan Dame, MD 04/20/2020, 5:46 AM   Pager: [phone]

## 2020-04-20 NOTE — Progress Notes (Signed)
Wheelchair:   Patient suffers from weakness which impairs their ability to perform daily activities like bathing, ambulating in the home. A walker will not resolve  issue with performing activities of daily living. A wheelchair will allow patient to safely perform daily activities. Patient is not able to propel themselves in the home using a standard weight wheelchair due to weakness. Patient can self propel in the lightweight wheelchair. Length of need 6 months.  Accessories: elevating leg rests (ELRs), wheel locks, extensions and anti-tippers

## 2020-04-20 NOTE — Progress Notes (Signed)
   Subjective:  Patient evaluated at bedside this AM. She states she is doing well, ready to go home. Denies dyspnea, lightheadedness, dizziness, chest pain. Mentions she was able to walk down the hall over the past two days.  Objective:  Vital signs in last 24 hours: Vitals:   04/19/20 0856 04/19/20 1226 04/19/20 2058 04/20/20 0529  BP:  137/77 136/71 (!) 150/73  Pulse:  96 77 72  Resp:  20 20 18   Temp:  98.7 F (37.1 C) 99 F (37.2 C) 98.9 F (37.2 C)  TempSrc:  Oral Oral Oral  SpO2: 95% 94% 100% 96%  Weight:      Height:       Physical Exam: General: Pleasant, laying in bed, no acute distress Pulm: Clear to auscultation. No wheezing, rhonchi, rales Neuro: Awake, alert, oriented x4. Moving extremities appropriately.  Assessment/Plan: Mr. Leavey is 64yo female with hypertension, CKDIIIb, combined systolic and diastolic heart failure, type II diabetes mellitus admitted 04/13/20 for COVID-19 pneumonia, now improved and ready to discharge home with [facility] PT.  Active Problems:   Chronic combined systolic and diastolic CHF, NYHA class 2 (HCC)   Type II diabetes mellitus with stage 3 chronic kidney disease (HCC)   Acute respiratory failure with hypoxia ([place])   COVID-19   #COVID-19 pneumonia This morning patient doing well on room air. Denies dyspnea, chest pain, palpitations. She has completed course of steroids and remdesivir. Patient instructed to stay in isolation until 05/02/2020. - Isolation until 05/02/2020 - F/u in clinic  #Reflex pre-syncope Patients reports no further episodes of lightheadedness/dizziness over the last two days. Will continue to hold Imdur given risk and will re-evaluate at clinic visit. - C/w compression stockings - PT/OT  #Chronic combined systolic and diastolic heart failure Blood pressures continue to be normotensive while admitted. No evidence of hypervolemia on exam. Will continue to hold Imdur given risk of orthostatic hypotension. - C/w  home coreg, losartan - Hold home Imdur  #Deconditioning #Generalized weakness Patient continues  to wish to be discharged home w/ [facility] PT. Given she has support at home with her daughter and has been able to ambulate over the last few days, will discharge home today. DME and home health orders have been placed. - DME walker, bedside commode ordered - HH PT ordered  DIET: HH/CM IVF: n/a DVT PPX: Lovenox BOWEL: Miralax, Senokot-S CODE: FULL FAM COM: Called patient's daughter, Glenard Haring, today. (Ph: [phone])  Prior to Admission Living Arrangement: Home Anticipated Discharge Location: Home w/ [facility] PT Barriers to Discharge: none Dispo: Anticipated discharge in approximately 0 day(s).   Sanjuan Dame, MD 04/20/2020, 5:45 AM Pager: [phone] After 5pm on weekdays and 1pm on weekends: On Call pager [phone]

## 2020-04-20 NOTE — TOC Transition Note (Addendum)
Transition of Care [facility]) - CM/SW Discharge Note   Patient Details  Name: [name] MRN: [id number] Date of Birth: [birth year]  Transition of [facility] CM/SW Contact:  Pollie Friar, RN Phone Number: 04/20/2020, 2:09 PM   Clinical Narrative:    Pt is discharging home with self care. CM has called multiple [place] agencies and they are not able to accept the patient at this time. CM will update the patient.  DME for home will be delivered to the room per [employer].  Daughter is aware of d/c. She will provide transport once pt is ready for pick up. Bedside RN to call her when ready.   1515: CM called pts Medicaid Healthy Blue plan and got her connected with a CM through the plan. They will reach out to the patient to see is she qualifies for caregivers in the home.    Final next level of care: Home/Self Care Barriers to Discharge: No Barriers Identified   Patient Goals and CMS Choice        Discharge Placement                       Discharge Plan and Services In-house Referral: Clinical Social Work Discharge Planning Services: CM Consult            DME Arranged: 3-N-1,Walker rolling,Wheelchair manual DME Agency: [employer] Date DME Agency Contacted: 04/20/20 Time DME Agency Contacted: 1300 Representative spoke with at DME Agency: [facility] Determinants of Health ([place]) Interventions     Readmission Risk Interventions No flowsheet data found.

## 2020-04-20 NOTE — Discharge Instructions (Signed)

## 2020-04-21 ENCOUNTER — Telehealth: Payer: Self-pay

## 2020-04-21 NOTE — Telephone Encounter (Signed)
Transition Care Management Follow-up Telephone Call  Date of discharge and from where: 04/20/2020 from [facility]  How have you been since you were released from the hospital? Patient feels well and states that she is feeling well.   Any questions or concerns? No  Items Reviewed:  Did the pt receive and understand the discharge instructions provided? Yes   Medications obtained and verified? Yes   Other? No   Any new allergies since your discharge? No   Dietary orders reviewed? Yes  Do you have support at home? Yes   Functional Questionnaire: (I = Independent and D = Dependent) ADLs: I  Bathing/Dressing- I  Meal Prep- I  Eating- I  Maintaining continence- I  Transferring/Ambulation- I  Managing Meds- I  Follow up appointments reviewed:   PCP Hospital f/u appt confirmed? Yes  Scheduled to see Dr. Coy Saunas on 05/07/2020 @ 1:45pm.  Are transportation arrangements needed? No  If their condition worsens, is the pt aware to call PCP or go to the Emergency Dept.? Yes Was the patient provided with contact information for the PCP's office or ED? Yes Was to pt encouraged to call back with questions or concerns? Yes

## 2020-04-28 MED FILL — OMEPRAZOLE DR 20 MG CAPSULE: 20 | 30 days supply | Qty: 30 | Fill #2

## 2020-05-07 ENCOUNTER — Other Ambulatory Visit: Payer: Self-pay

## 2020-05-07 ENCOUNTER — Ambulatory Visit (INDEPENDENT_AMBULATORY_CARE_PROVIDER_SITE_OTHER): Payer: Medicaid Other | Admitting: Student

## 2020-05-07 ENCOUNTER — Encounter: Payer: Self-pay | Admitting: Student

## 2020-05-07 DIAGNOSIS — M1611 Unilateral primary osteoarthritis, right hip: Secondary | ICD-10-CM

## 2020-05-07 DIAGNOSIS — N1831 Chronic kidney disease, stage 3a: Secondary | ICD-10-CM | POA: Diagnosis not present

## 2020-05-07 DIAGNOSIS — E1122 Type 2 diabetes mellitus with diabetic chronic kidney disease: Secondary | ICD-10-CM | POA: Diagnosis not present

## 2020-05-07 DIAGNOSIS — U071 COVID-19: Secondary | ICD-10-CM

## 2020-05-07 NOTE — Assessment & Plan Note (Signed)
Patient has A1c of 8.9 two 2 months ago.  Blood sugar was elevated in the hospital while on steroids.  Patient states since discharge, her blood sugar has ranged from the 130s-140s.  Patient was advised to continue current regimen until next patient visit. --Continue current antidiabetic regimen --Check A1c at next office visit

## 2020-05-07 NOTE — Progress Notes (Signed)
  [facility] Internal Medicine Residency Telephone Encounter Continuity Care Appointment  HPI:   This telephone encounter was created for [name] on 05/07/2020 for the following purpose/cc: Hospital follow-up after admission for Covid.   Past Medical History:  Past Medical History:  Diagnosis Date  . Anemia 07/17/2013  . Arthritis    "hands, shoulders, hips, legs" (05/10/2018)  . Arthritis of left hip 08/18/2013   S/p total hip arthroplasty.    . CHF (congestive heart failure) ([place])    2014...@ [facility]  . Chronic lower back pain   . CKD (chronic kidney disease) stage 3, GFR 30-59 ml/min (HCC)   . COPD (chronic obstructive pulmonary disease) ([place])   . Healthcare maintenance 06/16/2015  . High cholesterol   . Hypertension   . Influenza A 06/01/2017  . Influenza, pneumonia 06/01/2017  . Left flank pain 10/11/2016  . Myocardial infarction [facility])    "been told that I've had one; don't know when it would have been" (05/10/2018)  . Problem with sexual relationship 10/13/2017  . PVD (peripheral vascular disease) ([place])   . Shortness of breath 03/12/2019  . Type II diabetes mellitus ([place])    diagnosed 2001      ROS:   Patient reports generalized weakness.  Patient endorse occasional lightheadedness and dry cough.  She denied shortness of breath, chest pain, headaches, leg swelling, fever/chills, nausea or urinary symptoms.   Assessment / Plan / Recommendations:   Please see A&P under problem oriented charting for assessment of the patient's acute and chronic medical conditions.   As always, pt is advised that if symptoms worsen or new symptoms arise, they should go to an urgent care facility or to to ER for further evaluation.   Consent and Medical Decision Making:   Patient discussed with Dr. Philipp Ovens  This is a telephone encounter between [name] and Lacinda Axon on 05/07/2020 for Covid hospitalization follow-up. The visit was conducted with the patient located at home  and Lacinda Axon at [facility]. The patient's identity was confirmed using their DOB and current address. The patient has consented to being evaluated through a telephone encounter and understands the associated risks (an examination cannot be done and the patient may need to come in for an appointment) / benefits (allows the patient to remain at home, decreasing exposure to coronavirus). I personally spent 14 minutes on medical discussion.

## 2020-05-07 NOTE — Assessment & Plan Note (Signed)
Patient continues to have left hip pain from osteoarthritis.  She was recently hospitalized for Covid and this caused her to have decreased strength.  PT/OT recommended SNF but patient preferred home health with PT since daughter will be available 24/7.  Patient was however unable to get home health with PT since she does not have Medicaid yet and Medicaid would not cover it.  Patient states she continues to feel weak and often needs to use her wheelchair or a walker to get around her house.  Before the hospitalization patient did not require any assistive device for ambulation.  Patient is agreeable to outpatient physical therapy and states her daughter should be able to take her to these appointments. --Referral to PT sent --Follow-up in 2 to 3 weeks

## 2020-05-07 NOTE — Assessment & Plan Note (Addendum)
Patient will hospitalize for Covid pneumonia from December 12 to December 20.  She received the usual Covid treatments and was discharged home with home health PT. Patient was on room air with good O2 sats at discharge. Since discharge, patient states she has been doing well respiratory-wise.  She occasionally uses her inspiratory spirometer to exercise her lungs.  She denies any shortness of breath, chest pain, fever/chills, nausea but endorses occasional dry cough and occasional lightheadedness when she coughs.  States because she does not have Medicaid yet-she was unable to receive home health PT. patient states she still feels weak and has only been able to use her walker to go to the bathroom and back to her bedroom. In the setting of patient's decreased strength and gait instability from recent Covid hospitalization, plan to refer to PT. --PT referral --In-person visit in a few weeks

## 2020-05-12 NOTE — Progress Notes (Signed)
Internal Medicine Clinic Attending  Case discussed with Dr. [place]  At the time of the visit.  We reviewed the resident's history and exam and pertinent patient test results.  I agree with the assessment, diagnosis, and plan of care documented in the resident's note.  

## 2020-05-15 ENCOUNTER — Other Ambulatory Visit (HOSPITAL_COMMUNITY): Payer: Self-pay | Admitting: Interventional Cardiology

## 2020-05-15 ENCOUNTER — Other Ambulatory Visit: Payer: Self-pay

## 2020-05-15 ENCOUNTER — Other Ambulatory Visit: Payer: Self-pay | Admitting: Internal Medicine

## 2020-05-15 ENCOUNTER — Encounter: Payer: Self-pay | Admitting: *Deleted

## 2020-05-15 MED ORDER — NITROGLYCERIN 0.4 MG SL SUBL: 0.4000 mg | SUBLINGUAL_TABLET | SUBLINGUAL | 2 refills | Status: DC | PRN

## 2020-05-15 MED FILL — NITROGLYCERIN 0.4 MG TAB SL: 0.4 | 12 days supply | Qty: 25 | Fill #0

## 2020-05-15 NOTE — Telephone Encounter (Signed)
Pt's medication was sent to pt's pharmacy as requested. Confirmation received.  °

## 2020-06-01 ENCOUNTER — Encounter: Payer: Medicaid Other | Admitting: Student

## 2020-06-01 ENCOUNTER — Telehealth: Payer: Self-pay | Admitting: *Deleted

## 2020-06-01 NOTE — Telephone Encounter (Signed)
Called patient cell phone voice mail not yet set up / called house number left voice message for the patient to return call to clinic@ [phone] to reschedule missed appointment 06-01-2020.

## 2020-06-02 DIAGNOSIS — U071 COVID-19: Secondary | ICD-10-CM | POA: Diagnosis not present

## 2020-06-06 ENCOUNTER — Emergency Department (HOSPITAL_COMMUNITY): Payer: Medicaid Other

## 2020-06-06 ENCOUNTER — Inpatient Hospital Stay (HOSPITAL_COMMUNITY): Payer: Medicaid Other

## 2020-06-06 ENCOUNTER — Encounter (HOSPITAL_COMMUNITY): Payer: Self-pay

## 2020-06-06 ENCOUNTER — Inpatient Hospital Stay (HOSPITAL_COMMUNITY)
Admission: EM | Admit: 2020-06-06 | Discharge: 2020-06-12 | DRG: 246 | Disposition: A | Discharge status: Home Health | Payer: Medicaid Other | Attending: Internal Medicine | Admitting: Internal Medicine

## 2020-06-06 DIAGNOSIS — J449 Chronic obstructive pulmonary disease, unspecified: Secondary | ICD-10-CM | POA: Diagnosis not present

## 2020-06-06 DIAGNOSIS — I2510 Atherosclerotic heart disease of native coronary artery without angina pectoris: Secondary | ICD-10-CM | POA: Diagnosis not present

## 2020-06-06 DIAGNOSIS — K2100 Gastro-esophageal reflux disease with esophagitis, without bleeding: Secondary | ICD-10-CM | POA: Diagnosis not present

## 2020-06-06 DIAGNOSIS — Z9114 Patient's other noncompliance with medication regimen: Secondary | ICD-10-CM

## 2020-06-06 DIAGNOSIS — I313 Pericardial effusion (noninflammatory): Secondary | ICD-10-CM | POA: Diagnosis not present

## 2020-06-06 DIAGNOSIS — N1831 Chronic kidney disease, stage 3a: Secondary | ICD-10-CM | POA: Diagnosis not present

## 2020-06-06 DIAGNOSIS — I214 Non-ST elevation (NSTEMI) myocardial infarction: Secondary | ICD-10-CM

## 2020-06-06 DIAGNOSIS — U071 COVID-19: Secondary | ICD-10-CM

## 2020-06-06 DIAGNOSIS — I428 Other cardiomyopathies: Secondary | ICD-10-CM | POA: Diagnosis not present

## 2020-06-06 DIAGNOSIS — I252 Old myocardial infarction: Secondary | ICD-10-CM | POA: Diagnosis not present

## 2020-06-06 DIAGNOSIS — IMO0002 Reserved for concepts with insufficient information to code with codable children: Secondary | ICD-10-CM

## 2020-06-06 DIAGNOSIS — E1151 Type 2 diabetes mellitus with diabetic peripheral angiopathy without gangrene: Secondary | ICD-10-CM | POA: Diagnosis not present

## 2020-06-06 DIAGNOSIS — I130 Hypertensive heart and chronic kidney disease with heart failure and stage 1 through stage 4 chronic kidney disease, or unspecified chronic kidney disease: Secondary | ICD-10-CM | POA: Diagnosis not present

## 2020-06-06 DIAGNOSIS — I10 Essential (primary) hypertension: Secondary | ICD-10-CM | POA: Diagnosis present

## 2020-06-06 DIAGNOSIS — E7800 Pure hypercholesterolemia, unspecified: Secondary | ICD-10-CM | POA: Diagnosis not present

## 2020-06-06 DIAGNOSIS — E860 Dehydration: Secondary | ICD-10-CM | POA: Diagnosis present

## 2020-06-06 DIAGNOSIS — E1122 Type 2 diabetes mellitus with diabetic chronic kidney disease: Secondary | ICD-10-CM | POA: Diagnosis not present

## 2020-06-06 DIAGNOSIS — N179 Acute kidney failure, unspecified: Secondary | ICD-10-CM | POA: Diagnosis present

## 2020-06-06 DIAGNOSIS — E785 Hyperlipidemia, unspecified: Secondary | ICD-10-CM

## 2020-06-06 DIAGNOSIS — Z7982 Long term (current) use of aspirin: Secondary | ICD-10-CM | POA: Diagnosis not present

## 2020-06-06 DIAGNOSIS — N1832 Chronic kidney disease, stage 3b: Secondary | ICD-10-CM | POA: Diagnosis not present

## 2020-06-06 DIAGNOSIS — N183 Chronic kidney disease, stage 3 unspecified: Secondary | ICD-10-CM | POA: Diagnosis not present

## 2020-06-06 DIAGNOSIS — I951 Orthostatic hypotension: Secondary | ICD-10-CM

## 2020-06-06 DIAGNOSIS — E1310 Other specified diabetes mellitus with ketoacidosis without coma: Secondary | ICD-10-CM | POA: Diagnosis not present

## 2020-06-06 DIAGNOSIS — R32 Unspecified urinary incontinence: Secondary | ICD-10-CM | POA: Diagnosis not present

## 2020-06-06 DIAGNOSIS — R42 Dizziness and giddiness: Secondary | ICD-10-CM | POA: Diagnosis not present

## 2020-06-06 DIAGNOSIS — R52 Pain, unspecified: Secondary | ICD-10-CM | POA: Diagnosis not present

## 2020-06-06 DIAGNOSIS — Z8701 Personal history of pneumonia (recurrent): Secondary | ICD-10-CM

## 2020-06-06 DIAGNOSIS — R739 Hyperglycemia, unspecified: Secondary | ICD-10-CM

## 2020-06-06 DIAGNOSIS — N178 Other acute kidney failure: Secondary | ICD-10-CM | POA: Diagnosis not present

## 2020-06-06 DIAGNOSIS — I5023 Acute on chronic systolic (congestive) heart failure: Secondary | ICD-10-CM | POA: Diagnosis not present

## 2020-06-06 DIAGNOSIS — Z87891 Personal history of nicotine dependence: Secondary | ICD-10-CM

## 2020-06-06 DIAGNOSIS — Z96642 Presence of left artificial hip joint: Secondary | ICD-10-CM | POA: Diagnosis present

## 2020-06-06 DIAGNOSIS — E1110 Type 2 diabetes mellitus with ketoacidosis without coma: Secondary | ICD-10-CM | POA: Diagnosis not present

## 2020-06-06 DIAGNOSIS — I213 ST elevation (STEMI) myocardial infarction of unspecified site: Secondary | ICD-10-CM | POA: Diagnosis not present

## 2020-06-06 DIAGNOSIS — I5042 Chronic combined systolic (congestive) and diastolic (congestive) heart failure: Secondary | ICD-10-CM | POA: Diagnosis not present

## 2020-06-06 DIAGNOSIS — I959 Hypotension, unspecified: Secondary | ICD-10-CM | POA: Diagnosis present

## 2020-06-06 DIAGNOSIS — E1165 Type 2 diabetes mellitus with hyperglycemia: Secondary | ICD-10-CM | POA: Diagnosis not present

## 2020-06-06 DIAGNOSIS — K219 Gastro-esophageal reflux disease without esophagitis: Secondary | ICD-10-CM | POA: Diagnosis present

## 2020-06-06 DIAGNOSIS — Z79899 Other long term (current) drug therapy: Secondary | ICD-10-CM

## 2020-06-06 DIAGNOSIS — Z7984 Long term (current) use of oral hypoglycemic drugs: Secondary | ICD-10-CM | POA: Diagnosis not present

## 2020-06-06 DIAGNOSIS — Z23 Encounter for immunization: Secondary | ICD-10-CM

## 2020-06-06 DIAGNOSIS — Z8616 Personal history of COVID-19: Secondary | ICD-10-CM | POA: Diagnosis not present

## 2020-06-06 DIAGNOSIS — I25110 Atherosclerotic heart disease of native coronary artery with unstable angina pectoris: Secondary | ICD-10-CM | POA: Diagnosis not present

## 2020-06-06 DIAGNOSIS — I447 Left bundle-branch block, unspecified: Secondary | ICD-10-CM | POA: Diagnosis not present

## 2020-06-06 DIAGNOSIS — Z955 Presence of coronary angioplasty implant and graft: Secondary | ICD-10-CM

## 2020-06-06 DIAGNOSIS — R404 Transient alteration of awareness: Secondary | ICD-10-CM | POA: Diagnosis not present

## 2020-06-06 LAB — CBG MONITORING, ED
Glucose-Capillary: 595 mg/dL (ref 70–99)
Glucose-Capillary: 557 mg/dL (ref 70–99)
Glucose-Capillary: 588 mg/dL (ref 70–99)
Glucose-Capillary: 409 mg/dL — ABNORMAL HIGH (ref 70–99)
Glucose-Capillary: 290 mg/dL — ABNORMAL HIGH (ref 70–99)
Glucose-Capillary: 129 mg/dL — ABNORMAL HIGH (ref 70–99)
Glucose-Capillary: 138 mg/dL — ABNORMAL HIGH (ref 70–99)
Glucose-Capillary: 177 mg/dL — ABNORMAL HIGH (ref 70–99)
Glucose-Capillary: 182 mg/dL — ABNORMAL HIGH (ref 70–99)
Glucose-Capillary: 109 mg/dL — ABNORMAL HIGH (ref 70–99)
Glucose-Capillary: 89 mg/dL (ref 70–99)

## 2020-06-06 LAB — COMPREHENSIVE METABOLIC PANEL
ALT: 14 U/L (ref 0–44)
AST: 19 U/L (ref 15–41)
Albumin: 2.7 g/dL — ABNORMAL LOW (ref 3.5–5.0)
Alkaline Phosphatase: 80 U/L (ref 38–126)
Anion gap: 14 (ref 5–15)
BUN: 32 mg/dL — ABNORMAL HIGH (ref 8–23)
CO2: 18 mmol/L — ABNORMAL LOW (ref 22–32)
Calcium: 8.3 mg/dL — ABNORMAL LOW (ref 8.9–10.3)
Chloride: 96 mmol/L — ABNORMAL LOW (ref 98–111)
Creatinine, Ser: 2.48 mg/dL — ABNORMAL HIGH (ref 0.44–1.00)
GFR, Estimated: 21 mL/min — ABNORMAL LOW (ref >60)
Glucose, Bld: 661 mg/dL (ref 70–99)
Potassium: 4.7 mmol/L (ref 3.5–5.1)
Sodium: 128 mmol/L — ABNORMAL LOW (ref 135–145)
Total Bilirubin: 1.2 mg/dL (ref 0.3–1.2)
Total Protein: 6.2 g/dL — ABNORMAL LOW (ref 6.5–8.1)

## 2020-06-06 LAB — BASIC METABOLIC PANEL
Anion gap: 12 (ref 5–15)
Anion gap: 11 (ref 5–15)
BUN: 25 mg/dL — ABNORMAL HIGH (ref 8–23)
BUN: 23 mg/dL (ref 8–23)
CO2: 17 mmol/L — ABNORMAL LOW (ref 22–32)
CO2: 17 mmol/L — ABNORMAL LOW (ref 22–32)
Calcium: 7.8 mg/dL — ABNORMAL LOW (ref 8.9–10.3)
Calcium: 8 mg/dL — ABNORMAL LOW (ref 8.9–10.3)
Chloride: 109 mmol/L (ref 98–111)
Chloride: 110 mmol/L (ref 98–111)
Creatinine, Ser: 2.11 mg/dL — ABNORMAL HIGH (ref 0.44–1.00)
Creatinine, Ser: 1.89 mg/dL — ABNORMAL HIGH (ref 0.44–1.00)
GFR, Estimated: 26 mL/min — ABNORMAL LOW (ref >60)
GFR, Estimated: 29 mL/min — ABNORMAL LOW (ref >60)
Glucose, Bld: 205 mg/dL — ABNORMAL HIGH (ref 70–99)
Glucose, Bld: 116 mg/dL — ABNORMAL HIGH (ref 70–99)
Potassium: 3.5 mmol/L (ref 3.5–5.1)
Potassium: 3.5 mmol/L (ref 3.5–5.1)
Sodium: 138 mmol/L (ref 135–145)
Sodium: 138 mmol/L (ref 135–145)

## 2020-06-06 LAB — CBC WITH DIFFERENTIAL/PLATELET
Abs Immature Granulocytes: 0.02 10*3/uL (ref 0.00–0.07)
Basophils Absolute: 0 10*3/uL (ref 0.0–0.1)
Basophils Relative: 1 %
Eosinophils Absolute: 0 10*3/uL (ref 0.0–0.5)
Eosinophils Relative: 0 %
HCT: 40 % (ref 36.0–46.0)
Hemoglobin: 12.6 g/dL (ref 12.0–15.0)
Immature Granulocytes: 0 %
Lymphocytes Relative: 28 %
Lymphs Abs: 1.7 10*3/uL (ref 0.7–4.0)
MCH: 28.4 pg (ref 26.0–34.0)
MCHC: 31.5 g/dL (ref 30.0–36.0)
MCV: 90.1 fL (ref 80.0–100.0)
Monocytes Absolute: 0.6 10*3/uL (ref 0.1–1.0)
Monocytes Relative: 11 %
Neutro Abs: 3.6 10*3/uL (ref 1.7–7.7)
Neutrophils Relative %: 60 %
Platelets: 209 10*3/uL (ref 150–400)
RBC: 4.44 MIL/uL (ref 3.87–5.11)
RDW: 14.4 % (ref 11.5–15.5)
WBC: 6 10*3/uL (ref 4.0–10.5)
nRBC: 0 % (ref 0.0–0.2)

## 2020-06-06 LAB — I-STAT VENOUS BLOOD GAS, ED
Acid-base deficit: 6 mmol/L — ABNORMAL HIGH (ref 0.0–2.0)
Bicarbonate: 19.9 mmol/L — ABNORMAL LOW (ref 20.0–28.0)
Calcium, Ion: 1.01 mmol/L — ABNORMAL LOW (ref 1.15–1.40)
HCT: 42 % (ref 36.0–46.0)
Hemoglobin: 14.3 g/dL (ref 12.0–15.0)
O2 Saturation: 99 %
Potassium: 4.6 mmol/L (ref 3.5–5.1)
Sodium: 127 mmol/L — ABNORMAL LOW (ref 135–145)
TCO2: 21 mmol/L — ABNORMAL LOW (ref 22–32)
pCO2, Ven: 38.3 mmHg — ABNORMAL LOW (ref 44.0–60.0)
pH, Ven: 7.324 (ref 7.250–7.430)
pO2, Ven: 124 mmHg — ABNORMAL HIGH (ref 32.0–45.0)

## 2020-06-06 LAB — BETA-HYDROXYBUTYRIC ACID
Beta-Hydroxybutyric Acid: 2.13 mmol/L — ABNORMAL HIGH (ref 0.05–0.27)
Beta-Hydroxybutyric Acid: 2.93 mmol/L — ABNORMAL HIGH (ref 0.05–0.27)
Beta-Hydroxybutyric Acid: 0.81 mmol/L — ABNORMAL HIGH (ref 0.05–0.27)

## 2020-06-06 LAB — I-STAT CHEM 8, ED
BUN: 38 mg/dL — ABNORMAL HIGH (ref 8–23)
Calcium, Ion: 1.02 mmol/L — ABNORMAL LOW (ref 1.15–1.40)
Chloride: 99 mmol/L (ref 98–111)
Creatinine, Ser: 2.1 mg/dL — ABNORMAL HIGH (ref 0.44–1.00)
Glucose, Bld: 676 mg/dL (ref 70–99)
HCT: 43 % (ref 36.0–46.0)
Hemoglobin: 14.6 g/dL (ref 12.0–15.0)
Potassium: 4.6 mmol/L (ref 3.5–5.1)
Sodium: 128 mmol/L — ABNORMAL LOW (ref 135–145)
TCO2: 20 mmol/L — ABNORMAL LOW (ref 22–32)

## 2020-06-06 LAB — HEPARIN LEVEL (UNFRACTIONATED): Heparin Unfractionated: 0.5 IU/mL (ref 0.30–0.70)

## 2020-06-06 LAB — ECHOCARDIOGRAM COMPLETE
Area-P 1/2: 2.87 cm2
Height: 63 in
P 1/2 time: 325 msec
S' Lateral: 3.4 cm
Weight: 2881.85 oz

## 2020-06-06 LAB — TROPONIN I (HIGH SENSITIVITY)
Troponin I (High Sensitivity): 2410 ng/L (ref <18)
Troponin I (High Sensitivity): 2238 ng/L (ref <18)

## 2020-06-06 LAB — BRAIN NATRIURETIC PEPTIDE: B Natriuretic Peptide: 953.9 pg/mL — ABNORMAL HIGH (ref 0.0–100.0)

## 2020-06-06 LAB — LACTIC ACID, PLASMA
Lactic Acid, Venous: 2.2 mmol/L (ref 0.5–1.9)
Lactic Acid, Venous: 3.9 mmol/L (ref 0.5–1.9)
Lactic Acid, Venous: 1.3 mmol/L (ref 0.5–1.9)

## 2020-06-06 MED ORDER — ASPIRIN 81 MG PO CHEW
324.0000 mg | CHEWABLE_TABLET | Freq: Once | ORAL | Status: AC
  Administered 2020-06-06: 324 mg via ORAL
  Filled 2020-06-06: qty 4

## 2020-06-06 MED ORDER — ALBUTEROL SULFATE HFA 108 (90 BASE) MCG/ACT IN AERS: 2.0000 | INHALATION_SPRAY | Freq: Four times a day (QID) | RESPIRATORY_TRACT | Status: DC | PRN

## 2020-06-06 MED ORDER — DEXTROSE-NACL 5-0.9 % IV SOLN: INTRAVENOUS | Status: DC

## 2020-06-06 MED ORDER — DEXTROSE IN LACTATED RINGERS 5 % IV SOLN: INTRAVENOUS | Status: DC

## 2020-06-06 MED ORDER — ATORVASTATIN CALCIUM 40 MG PO TABS
40.0000 mg | ORAL_TABLET | Freq: Every day | ORAL | Status: DC
  Administered 2020-06-06 – 2020-06-11 (×6): 40 mg via ORAL
  Filled 2020-06-06 (×6): qty 1

## 2020-06-06 MED ORDER — INSULIN REGULAR(HUMAN) IN NACL 100-0.9 UT/100ML-% IV SOLN
INTRAVENOUS | Status: DC
  Administered 2020-06-06: 10.5 [IU]/h via INTRAVENOUS
  Filled 2020-06-06: qty 100

## 2020-06-06 MED ORDER — SODIUM CHLORIDE 0.9 % IV SOLN: Freq: Once | INTRAVENOUS | Status: AC

## 2020-06-06 MED ORDER — SODIUM CHLORIDE 0.9 % IV BOLUS
1000.0000 mL | Freq: Once | INTRAVENOUS | Status: AC
  Administered 2020-06-06: 1000 mL via INTRAVENOUS

## 2020-06-06 MED ORDER — PANTOPRAZOLE SODIUM 40 MG PO TBEC
40.0000 mg | DELAYED_RELEASE_TABLET | Freq: Every day | ORAL | Status: DC
  Administered 2020-06-06 – 2020-06-12 (×6): 40 mg via ORAL
  Filled 2020-06-06 (×6): qty 1

## 2020-06-06 MED ORDER — DEXTROSE 50 % IV SOLN: 0.0000 mL | INTRAVENOUS | Status: DC | PRN

## 2020-06-06 MED ORDER — ONDANSETRON HCL 4 MG/2ML IJ SOLN: 4.0000 mg | Freq: Four times a day (QID) | INTRAMUSCULAR | Status: DC | PRN

## 2020-06-06 MED ORDER — NITROGLYCERIN 0.4 MG SL SUBL: 0.4000 mg | SUBLINGUAL_TABLET | SUBLINGUAL | Status: DC | PRN

## 2020-06-06 MED ORDER — HEPARIN (PORCINE) 25000 UT/250ML-% IV SOLN
900.0000 [IU]/h | INTRAVENOUS | Status: DC
  Administered 2020-06-06: 06:00:00 850 [IU]/h via INTRAVENOUS
  Administered 2020-06-08: 900 [IU]/h via INTRAVENOUS
  Filled 2020-06-06 (×3): qty 250

## 2020-06-06 MED ORDER — SODIUM CHLORIDE 0.9 % IV BOLUS
20.0000 mL/kg | Freq: Once | INTRAVENOUS | Status: AC
  Administered 2020-06-06: 1634 mL via INTRAVENOUS

## 2020-06-06 MED ORDER — LACTATED RINGERS IV SOLN: INTRAVENOUS | Status: DC

## 2020-06-06 MED ORDER — HEPARIN BOLUS VIA INFUSION
4000.0000 [IU] | Freq: Once | INTRAVENOUS | Status: AC
  Administered 2020-06-06: 4000 [IU] via INTRAVENOUS
  Filled 2020-06-06: qty 4000

## 2020-06-06 MED ORDER — SODIUM CHLORIDE 0.9 % IV BOLUS: 1000.0000 mL | Freq: Once | INTRAVENOUS | Status: DC

## 2020-06-06 MED ORDER — ACETAMINOPHEN 500 MG PO TABS
1000.0000 mg | ORAL_TABLET | Freq: Four times a day (QID) | ORAL | Status: DC | PRN
  Administered 2020-06-07: 22:00:00 1000 mg via ORAL
  Filled 2020-06-06: qty 2

## 2020-06-06 MED ORDER — ASPIRIN EC 81 MG PO TBEC
81.0000 mg | DELAYED_RELEASE_TABLET | Freq: Every day | ORAL | Status: DC
  Administered 2020-06-07 – 2020-06-12 (×5): 81 mg via ORAL
  Filled 2020-06-06 (×5): qty 1

## 2020-06-06 NOTE — ED Notes (Signed)
Blood glucose 174 , not crossing into Epic

## 2020-06-06 NOTE — ED Notes (Signed)
CBG 148, not crossing over to [employer]

## 2020-06-06 NOTE — Progress Notes (Signed)
[facility] for Heparin Indication: chest pain/ACS  Allergies  Allergen Reactions  . Lisinopril Cough  . Plavix [Clopidogrel Bisulfate] Rash    Patient Measurements: Height: '5\' 3"'$  (160 cm) Weight: 81.7 kg (180 lb 1.9 oz) IBW/kg (Calculated) : 52.4 Heparin Dosing Weight: 69 kg  Vital Signs: Temp: 98.7 F (37.1 C) (02/05 0240) Temp Source: Oral (02/05 0240) BP: 128/68 (02/05 1100) Pulse Rate: 84 (02/05 1100)  Labs: Recent Labs    06/06/20 0227 06/06/20 0258 06/06/20 0259 06/06/20 0455 06/06/20 1140 06/06/20 1141  HGB 12.6 14.6 14.3  --   --   --   HCT 40.0 43.0 42.0  --   --   --   PLT 209  --   --   --   --   --   HEPARINUNFRC  --   --   --   --   --  0.50  CREATININE 2.48* 2.10*  --   --  2.11*  --   TROPONINIHS 2,410*  --   --  2,238*  --   --     Estimated Creatinine Clearance: 27.6 mL/min (A) (by C-G formula based on SCr of 2.11 mg/dL (H)).   Medical History: Past Medical History:  Diagnosis Date  . Anemia 07/17/2013  . Arthritis    "hands, shoulders, hips, legs" (05/10/2018)  . Arthritis of left hip 08/18/2013   S/p total hip arthroplasty.    . CHF (congestive heart failure) ([place])    2014...@ [facility]  . Chronic lower back pain   . CKD (chronic kidney disease) stage 3, GFR 30-59 ml/min (HCC)   . COPD (chronic obstructive pulmonary disease) ([place])   . Healthcare maintenance 06/16/2015  . High cholesterol   . Hypertension   . Influenza A 06/01/2017  . Influenza, pneumonia 06/01/2017  . Left flank pain 10/11/2016  . Myocardial infarction [facility])    "been told that I've had one; don't know when it would have been" (05/10/2018)  . Problem with sexual relationship 10/13/2017  . PVD (peripheral vascular disease) ([place])   . Shortness of breath 03/12/2019  . Type II diabetes mellitus ([place])    diagnosed 2001    Medications:  See electronic med rec  Assessment: 65 y.o. F presents with CP. To begin heparin for ACS. No AC PTA. CBC ok at  baseline.  Initial heparin level therapeutic on 850 units/hr  Goal of Therapy:  Heparin level 0.3-0.7 units/ml Monitor platelets by anticoagulation protocol: Yes   Plan:  Continue heparin gtt at 850 units/hr Daily heparin level, CBC, s/s bleeding F/u cards plan for cath vs medical management and LOT  Bertis Ruddy, PharmD Clinical Pharmacist ED Pharmacist Phone # [phone] 06/06/2020 1:17 PM

## 2020-06-06 NOTE — ED Triage Notes (Signed)
Assume care from EMS, EMS reports pt is coming from home CC of high CBG. Ems reports on scene pt CBG read >600 on the glucometer. EMS reports pt takes trulicity with no relief of high CBG.Pt also reports pt was hypotensive and administer 1000cc of NS. Hx of HF   74/40 RR 18 HR 80

## 2020-06-06 NOTE — Progress Notes (Signed)
ANTICOAGULATION CONSULT NOTE - Initial Consult  Pharmacy Consult for Heparin Indication: chest pain/ACS  Allergies  Allergen Reactions  . Lisinopril Cough  . Plavix [Clopidogrel Bisulfate] Rash    Patient Measurements: Height: '5\' 3"'$  (160 cm) Weight: 81.7 kg (180 lb 1.9 oz) IBW/kg (Calculated) : 52.4 Heparin Dosing Weight: 69 kg  Vital Signs: Temp: 98.7 F (37.1 C) (02/05 0240) Temp Source: Oral (02/05 0240) BP: 148/74 (02/05 0400) Pulse Rate: 80 (02/05 0400)  Labs: Recent Labs    06/06/20 0227 06/06/20 0258 06/06/20 0259  HGB 12.6 14.6 14.3  HCT 40.0 43.0 42.0  PLT 209  --   --   CREATININE 2.48* 2.10*  --   TROPONINIHS 2,410*  --   --     Estimated Creatinine Clearance: 27.7 mL/min (A) (by C-G formula based on SCr of 2.1 mg/dL (H)).   Medical History: Past Medical History:  Diagnosis Date  . Anemia 07/17/2013  . Arthritis    "hands, shoulders, hips, legs" (05/10/2018)  . Arthritis of left hip 08/18/2013   S/p total hip arthroplasty.    . CHF (congestive heart failure) ([place])    2014...@ [facility]  . Chronic lower back pain   . CKD (chronic kidney disease) stage 3, GFR 30-59 ml/min (HCC)   . COPD (chronic obstructive pulmonary disease) ([place])   . Healthcare maintenance 06/16/2015  . High cholesterol   . Hypertension   . Influenza A 06/01/2017  . Influenza, pneumonia 06/01/2017  . Left flank pain 10/11/2016  . Myocardial infarction [facility])    "been told that I've had one; don't know when it would have been" (05/10/2018)  . Problem with sexual relationship 10/13/2017  . PVD (peripheral vascular disease) ([place])   . Shortness of breath 03/12/2019  . Type II diabetes mellitus ([place])    diagnosed 2001    Medications:  See electronic med rec  Assessment: 65 y.o. F presents with CP. To begin heparin for ACS. No AC PTA. CBC ok at baseline.  Goal of Therapy:  Heparin level 0.3-0.7 units/ml Monitor platelets by anticoagulation protocol: Yes   Plan:  Heparin IV bolus 4000  units Heparin gtt at 950 units/hr Will f/u heparin level in 8 hours Daily heparin level and CBC  Sherlon Handing, PharmD, BCPS Please see amion for complete clinical pharmacist phone list 06/06/2020,4:31 AM

## 2020-06-06 NOTE — Progress Notes (Signed)
Interval cardiology attending note: Patient seen early this morning by cardiology fellow. Awaiting echocardiogram.

## 2020-06-06 NOTE — Hospital Course (Addendum)
[name] is a 65 year old female with a histrory of

## 2020-06-06 NOTE — H&P (Signed)
Date: 06/06/2020               Patient Name:  [name] MRN: [id number]  DOB: 11-Apr-1956 Age / Sex: 65 y.o., female   PCP: Alyssa Heck, DO         Medical Service: Internal Medicine Teaching Service         Attending Physician: Dr. Lalla Brothers    First Contact: Dr. Lisabeth Devoid Pager: [phone]  Second Contact: Dr. Marva Panda Pager: [phone]       After Hours (After 5p/  First Contact Pager: [phone]  weekends / holidays): Second Contact Pager: [phone]   Chief Complaint: chest pain  History of Present Illness: [name] is a 65 y/o female with history of type II DM, HFrecEF, CAD, COPD, HTN, CKD IIIb and PVD who presents with one week of intermittent chest pain. [place] pain has been substernal, described as tightness that is worse with exertion and occasionally worsens with deep breaths. She also endorses significant DOE, dizziness, fatigue, generalized weakness, and confusion that have progressively worsened over the past week. She has been taking sublingual nitro with relief of her CP. Also has had some vomiting, not daily, over the last 2 weeks, although currently denies nausea. She hasn't been eating or drinking much the last week and has not been able to get up and walk around. She has not been taking any of her medications regularly since she was recently hospitalized for COVID-19 in December. She says she was feeling better previously and didn't think she needed them. However, she has called EMS multiple times over the past week due to hyperglycemia. Her daughter decided to bring her to the ED due to concern for worsening overall health and functioning. Denies any headaches, syncope, focal numbness or weakness, abdominal pain, urinary symptoms.  Home Meds:  Current Meds  Medication Sig  . Accu-Chek FastClix Lancets MISC Check blood sugar up to  3x/day before meals  . ACCU-CHEK GUIDE test strip CHECK BLOOD SUGAR UP TO 3X/DAY BEFORE MEALS  . acetaminophen (TYLENOL) 500 MG tablet  Take 1,000 mg by mouth every 6 (six) hours as needed for mild pain, fever or headache.  . albuterol (PROVENTIL HFA) 108 (90 Base) MCG/ACT inhaler Inhale 2 puffs into the lungs every 6 (six) hours as needed for wheezing or shortness of breath.  Marland Kitchen amLODipine (NORVASC) 5 MG tablet Take 1 tablet (5 mg total) by mouth daily.  Marland Kitchen aspirin EC 81 MG tablet Take 1 tablet (81 mg total) by mouth daily. IM program, [facility]  . atorvastatin (LIPITOR) 40 MG tablet TAKE 1 TABLET (40 MG TOTAL) BY MOUTH DAILY.  . blood glucose meter kit and supplies KIT Dispense based on patient and insurance preference. Use up to four times daily as directed. (FOR ICD-9 250.00, 250.01).  . blood glucose meter kit and supplies KIT Dispense based on patient and insurance preference. Use up to four times daily as directed. (FOR ICD-9 250.00, 250.01).  . Blood Glucose Monitoring Suppl (ACCU-CHEK GUIDE) w/Device KIT 1 each by Does not apply route 3 (three) times daily.  . carvedilol (COREG) 12.5 MG tablet TAKE 1 TABLET (12.5 MG TOTAL) BY MOUTH 2 (TWO) TIMES DAILY WITH A MEAL.  Marland Kitchen Cholecalciferol (VITAMIN D3) 125 MCG (5000 UT) CAPS Take 5,000 Units by mouth daily.  . Dulaglutide (TRULICITY) 7.78 EU/2.3NT SOPN Inject 0.75 mg into the skin once a week.  . empagliflozin (JARDIANCE) 25 MG TABS tablet Take 1 tablet (25 mg total)  by mouth daily.  . furosemide (LASIX) 20 MG tablet Take 1 tablet (20 mg total) by mouth daily. (Patient taking differently: Take 20 mg by mouth daily as needed for fluid.)  . losartan (COZAAR) 100 MG tablet Take 1 tablet (100 mg total) by mouth daily.  . metFORMIN (GLUCOPHAGE-XR) 500 MG 24 hr tablet Take 4 tablets (2,000 mg total) by mouth daily with breakfast. (Patient taking differently: Take 1,000 mg by mouth in the morning and at bedtime.)  . Multiple Vitamins-Minerals (MULTIVITAMIN GUMMIES ADULT) CHEW Chew 2 tablets by mouth daily.  . nitroGLYCERIN (NITROSTAT) 0.4 MG SL tablet Place 1 tablet (0.4 mg total) under the  tongue every 5 (five) minutes as needed for chest pain.  Marland Kitchen omeprazole (PRILOSEC) 20 MG capsule TAKE 1 CAPSULE (20 MG TOTAL) BY MOUTH DAILY.  Marland Kitchen tiZANidine (ZANAFLEX) 4 MG tablet Take 1 tablet (4 mg total) by mouth every 6 (six) hours as needed for muscle spasms.  Marland Kitchen UNIFINE PENTIPS 31G X 5 MM MISC USE TO INJECT 20 UNITS DAILY    Allergies: Allergies as of 06/06/2020 - Review Complete 06/06/2020  Allergen Reaction Noted  . Lisinopril Cough 06/29/2017  . Plavix [clopidogrel bisulfate] Rash 12/21/2012   Past Medical History:  Diagnosis Date  . Anemia 07/17/2013  . Arthritis    "hands, shoulders, hips, legs" (05/10/2018)  . Arthritis of left hip 08/18/2013   S/p total hip arthroplasty.    . CHF (congestive heart failure) ([place])    2014...@ [facility]  . Chronic lower back pain   . CKD (chronic kidney disease) stage 3, GFR 30-59 ml/min (HCC)   . COPD (chronic obstructive pulmonary disease) ([place])   . Healthcare maintenance 06/16/2015  . High cholesterol   . Hypertension   . Influenza A 06/01/2017  . Influenza, pneumonia 06/01/2017  . Left flank pain 10/11/2016  . Myocardial infarction [facility])    "been told that I've had one; don't know when it would have been" (05/10/2018)  . Problem with sexual relationship 10/13/2017  . PVD (peripheral vascular disease) ([place])   . Shortness of breath 03/12/2019  . Type II diabetes mellitus ([place])    diagnosed 2001    Family History: Grandmother and father both had MI (father was in his 56's).  Social History: lives at home with her husband who hasn't been able to take care of her as much since he has also been ill with COVID. Also lives with her daughter who is involved in her care, although notes she is away at work during the day. Remote history of tobacco use but hasn't smoked in >20 years, drinks an occasional 1-3 glasses of wine without hx of heavy use, no other illicit substance use.  Review of Systems: A complete ROS was negative except as per HPI.    Physical Exam: Blood pressure (!) 148/74, pulse 80, temperature 98.7 F (37.1 C), temperature source Oral, resp. rate 18, height '5\' 3"'  (1.6 m), weight 81.7 kg, SpO2 97 %.  General: Patient is obese and appears uncomfortable in mild acute distress. Eyes: Sclera non-icteric. No conjunctival injection.  HENT: Neck is supple. No nasal discharge. Mucus membranes appear dry. Respiratory: Lungs are CTA, bilaterally. No wheezes, rales, or rhonchi. Mild tachypnea and increased work of breathing present, on room air.  Cardiovascular: Regular rate and rhythm. No murmurs, rubs, or gallops. No lower extremity edema. LE's warm and well perfused.  Abdominal: Soft, non-distended and non-tender to palpation. Bowel sounds intact. No rebound or guarding. Neurological: Patient is somnolent. Oriented,  able to follow simple commands, although agitated. CN II-XII intact.  Musculoskeletal: Strength is 4+/5 in all four extremities. Normal muscle bulk and tone.  Skin: There are multiple sub-centimeter lesions on bilateral shins in process of healing. No rashes noted.  Psych: Patient slightly agitated although cooperative.   EKG: personally reviewed my interpretation is NSR at 79 bpm with LBBB also noted on previous EKG's.  CXR: personally reviewed my interpretation is no active cardiopulmonary disease.  Assessment & Plan by Problem: Active Problems:   NSTEMI (non-ST elevated myocardial infarction) [facility])  Ms. Ryther is a 65 y/o female with history of type II DM, HFrecEF, CAD, COPD, HTN, CKD IIIb and PVD who presents with one week of intermittent chest pain.   # NSTEMI  Patient presents with worsening anginal symptoms over the past couple of weeks. Troponins 2410 > 2238, BNP 953.9, LA 2.2 > 3.9. Patient has history of DM with multi-vessel obstructive CAD who has been followed for some time due to potential need for CABG. Received ASA 37m in the ED.   - Cardiology consulted, appreciate their recommendations -  Continue conservative management due to significant comorbidities - Heparin bolus and infusion - ECHO ordered - ASA 862mdaily - Lipitor 4032maily  - NTG and Zofran PRN - Restart Coreg and Losartan when able  - Continuous telemetry and pulse ox   # Hypotension Blood pressures initially in the 70'70'Hstolic. Received 1.6L NS bolus in the ED with significant improvement in mental status. Likely due to dehydration in the setting of poor PO intake and DKA, likely 2/2 NSTEMI. Also consider infectious etiology given minimal improvement in pressures despite fluids with worsening lactic acidosis, although CXR does not show acute cardiopulmonary process and patient denies urinary symptoms.  - Give additional 1L NS bolus  - Check blood cultures - Check urinalysis - Continue to monitor  - Hold home medications  # DKA  Initial glucose 661, anion gap 14, CO2 18, BHB 2.13. Endorses high sugars over the past week, likely in the setting of NSTEMI complicated by medication non-compliance. Prescribed Jardiance and Trulicity at home.   - Start Endotool protocol   # AKI on CKD Creatinine 2.48 on admission, BUN 32, GFR 21, potassium 4.7. Baseline creatinine ~1.7. Likely prerenal in the setting of NSTEMI and decreased PO intake.   - Will give another 1L NS bolus   # HFrecEF Cardiac catheterization in 2019 showed reduced LVEF 30-35%, although EF normalized on most recent ECHO 08/2018 with GDMT despite severely increased LV wall thickness and continued diastolic dysfunction. CXR unremarkable without signs of volume overload. Patient clinically appears dry.   - Daily weights - Strict I&O - Check ECHO  # History of Aortic Regurgitation ECHO from 08/2018 showed mild aortic valve regurgitation. Patient does not have murmur to suggest worsening of this, although could be contributing to current hypotension.   - Check ECHO  # HLD  - Continue home atorvastatin 32m30mily   # GERD  - Continue Protonix  32mg27mly  Code Status: Full code  Diet: NPO  IVF: D5LR 125mL/19mDVT PPx: On heparin infusion  Dispo: Admit patient to Inpatient with expected length of stay greater than 2 midnights.  Signed: BlooDelice Bison/08/2020, 6:05 AM  Pager: [phone] 5pm on weekdays and 1pm on weekends: On Call pager: [phone]

## 2020-06-06 NOTE — Consult Note (Signed)
Cardiology Consultation:   Alyssa ID: Alyssa Alyssa MRN: [id number]; DOB: 05-May-1955  Admit date: 06/06/2020 Date of Consult: 06/06/2020  Primary Care Provider: Marty Heck, [name] Cardiologist: Alyssa Grooms, MD  Alyssa Alyssa:  None    Alyssa Alyssa:   Alyssa Alyssa is a 65 y.o. female with a hx of poorly controlled DM, HFrecEF, unrevascularized CAD, COPD, CKD, HTN and PVD who is being seen today for Alyssa evaluation of elevated troponin at Alyssa request of Dr. Leonette Alyssa.  History of Present Illness:   Alyssa Alyssa was hospitalized in December 2021 for COVID pneumonia and has not been doing well since. Over Alyssa past few weeks, she has had intermittent chest discomfort for which she has had to take SL nitro. She states that she has been taking this about twice a week and has had to take up to 2 tablets at a time. She has been using a walker to ambulate since her recent discharge and has struggled to walk due to shortness of breath. She has not had any chest pain today. However, she felt weak and lightheaded prompting her to come to Alyssa ER. She denies any current chest pain, shortness of breath, palpitations, lower leg swelling, orthopnea or PND. She reports good compliance with her medications at home.    Past Medical History:  Diagnosis Date  . Anemia 07/17/2013  . Arthritis    "hands, shoulders, hips, legs" (05/10/2018)  . Arthritis of left hip 08/18/2013   S/p total hip arthroplasty.    . CHF (congestive heart failure) ([place])    2014...@ [facility]  . Chronic lower back pain   . CKD (chronic kidney disease) stage 3, GFR 30-59 ml/min (HCC)   . COPD (chronic obstructive pulmonary disease) ([place])   . Healthcare maintenance 06/16/2015  . High cholesterol   . Hypertension   . Influenza A 06/01/2017  . Influenza, pneumonia 06/01/2017  . Left flank pain 10/11/2016  . Myocardial infarction [facility])    "been told that I've had one; don't know when it would have  been" (05/10/2018)  . Problem with sexual relationship 10/13/2017  . PVD (peripheral vascular disease) ([place])   . Shortness of breath 03/12/2019  . Type II diabetes mellitus ([place])    diagnosed 2001    Past Surgical History:  Procedure Laterality Date  . ABDOMINAL ANGIOGRAM  12/28/2012  . ABDOMINAL ANGIOGRAM N/A 12/29/2011   Procedure: ABDOMINAL ANGIOGRAM;  Surgeon: Alyssa Almond, MD;  Location: [facility];  Service: Cardiovascular;  Laterality: N/A;  . ABDOMINAL AORTAGRAM N/A 12/21/2012   Procedure: ABDOMINAL Alyssa Alyssa;  Surgeon: Alyssa Dutch, MD;  Location: [facility];  Service: Cardiovascular;  Laterality: N/A;  . EYE SURGERY Bilateral ~ 2018   "laser; related to my diabetes"  . JOINT REPLACEMENT    . PERIPHERAL VASCULAR CATHETERIZATION     "had to have some veins unclogged cause I was in pain when I walked" (05/10/2018)  . RIGHT/LEFT HEART CATH AND CORONARY ANGIOGRAPHY N/A 04/30/2018   Procedure: RIGHT/LEFT HEART CATH AND CORONARY ANGIOGRAPHY;  Surgeon: Alyssa Crome, MD;  Location: [facility];  Service: Cardiovascular;  Laterality: N/A;  . TOTAL HIP ARTHROPLASTY Left 08/19/2013   Procedure: TOTAL HIP ARTHROPLASTY;  Surgeon: Alyssa Salen, MD;  Location: [name];  Service: Orthopedics;  Laterality: Left;  . TUBAL LIGATION       Home Medications:  Prior to Admission medications   Medication Sig Start Date End Date Taking?  Authorizing Provider  Accu-Chek FastClix Lancets MISC Check blood sugar up to  3x/day before meals 09/05/18  Yes Alyssa Alyssa  ACCU-CHEK GUIDE test strip CHECK BLOOD SUGAR UP TO 3X/DAY BEFORE MEALS 11/08/19  Yes Alyssa Payment, MD  acetaminophen (TYLENOL) 500 MG tablet Take 1,000 mg by mouth every 6 (six) hours as needed for mild pain, fever or headache.   Yes [provider]  albuterol (PROVENTIL HFA) 108 (90 Base) MCG/ACT inhaler Inhale 2 puffs into Alyssa lungs every 6 (six) hours as needed for wheezing or shortness of breath. 03/12/19  Yes  Alyssa Mercury, MD  amLODipine (NORVASC) 5 MG tablet Take 1 tablet (5 mg total) by mouth daily. 01/22/20  Yes Alyssa Drown D, NP  aspirin EC 81 MG tablet Take 1 tablet (81 mg total) by mouth daily. IM program, [facility] 04/22/16  Yes Alyssa Herrlich, MD  atorvastatin (LIPITOR) 40 MG tablet TAKE 1 TABLET (40 MG TOTAL) BY MOUTH DAILY. 09/29/19  Yes Alyssa Alyssa  blood glucose meter kit and supplies KIT Dispense based on Alyssa and insurance preference. Use up to four times daily as directed. (FOR ICD-9 250.00, 250.01). 02/28/18  Yes Kathi Ludwig, MD  blood glucose meter kit and supplies KIT Dispense based on Alyssa and insurance preference. Use up to four times daily as directed. (FOR ICD-9 250.00, 250.01). 04/20/20  Yes Alyssa Dame, MD  Blood Glucose Monitoring Suppl (ACCU-CHEK GUIDE) w/Device KIT 1 each by Does not apply route 3 (three) times daily. 09/05/18  Yes Alyssa Alyssa  carvedilol (COREG) 12.5 MG tablet TAKE 1 TABLET (12.5 MG TOTAL) BY MOUTH 2 (TWO) TIMES DAILY WITH A MEAL. 02/04/20  Yes Alyssa Axon, MD  Cholecalciferol (VITAMIN D3) 125 MCG (5000 UT) CAPS Take 5,000 Units by mouth daily.   Yes [provider]  Dulaglutide (TRULICITY) 9.56 OZ/3.0QM SOPN Inject 0.75 mg into Alyssa skin once a week. 09/25/19  Yes Alyssa Alyssa  empagliflozin (Alyssa Alyssa) 25 MG TABS tablet Take 1 tablet (25 mg total) by mouth daily. 02/17/20 08/15/20 Yes Alyssa, Alyssa March, MD  furosemide (LASIX) 20 MG tablet Take 1 tablet (20 mg total) by mouth daily. Alyssa taking differently: Take 20 mg by mouth daily as needed for fluid. 03/20/19 07/05/19 Yes Alyssa Crome, MD  losartan (COZAAR) 100 MG tablet Take 1 tablet (100 mg total) by mouth daily. 02/17/20  Yes Alyssa Axon, MD  metFORMIN (GLUCOPHAGE-XR) 500 MG 24 hr tablet Take 4 tablets (2,000 mg total) by mouth daily with breakfast. Alyssa taking differently: Take 1,000 mg by mouth in Alyssa morning and at  bedtime. 10/24/19  Yes Alyssa Alyssa  Multiple Vitamins-Minerals (MULTIVITAMIN GUMMIES ADULT) CHEW Chew 2 tablets by mouth daily.   Yes [provider]  nitroGLYCERIN (NITROSTAT) 0.4 MG SL tablet Place 1 tablet (0.4 mg total) under Alyssa tongue every 5 (five) minutes as needed for chest pain. 05/15/20 08/13/20 Yes Alyssa Crome, MD  omeprazole (PRILOSEC) 20 MG capsule TAKE 1 CAPSULE (20 MG TOTAL) BY MOUTH DAILY. 12/12/19  Yes Virl Axe, MD  tiZANidine (ZANAFLEX) 4 MG tablet Take 1 tablet (4 mg total) by mouth every 6 (six) hours as needed for muscle spasms. 02/17/20  Yes Alyssa Axon, MD  UNIFINE PENTIPS 31G X 5 MM MISC USE TO INJECT 20 UNITS DAILY 04/09/19  Yes Alyssa Alyssa    Inpatient Medications: Scheduled Meds: . aspirin  324 mg Oral Once  . heparin  4,000 Units Intravenous Once   Continuous Infusions: . dextrose 5% lactated ringers Stopped (06/06/20 0351)  . heparin    . insulin 10.5 Units/hr (06/06/20 0350)  . lactated ringers 125 mL/hr at 06/06/20 0347   PRN Meds: dextrose  Allergies:    Allergies  Allergen Reactions  . Lisinopril Cough  . Plavix [Clopidogrel Bisulfate] Rash    Social History:   Social History   Socioeconomic History  . Marital status: Married    Spouse name: Not on file  . Number of children: Not on file  . Years of education: 12,college  . Highest education level: Not on file  Occupational History  . Not on file  Tobacco Use  . Smoking status: Former Smoker    Years: 7.00    Types: Cigarettes    Quit date: 05/08/1987    Years since quitting: 33.1  . Smokeless tobacco: Never Used  . Tobacco comment: 05/10/2018 "smoked when I drank"  Vaping Use  . Vaping Use: Never used  Substance and Sexual Activity  . Alcohol use: Not Currently    Alcohol/week: 0.0 standard drinks    Comment: 05/10/2018 "glass of wine on holidays"  . Drug use: Not Currently  . Sexual activity: Not Currently  Other Topics Concern  . Not on  file  Social History Narrative  . Not on file   Social Determinants of Health   Financial Resource Strain: Not on file  Food Insecurity: Not on file  Transportation Needs: Not on file  Physical Activity: Not on file  Stress: Not on file  Social Connections: Not on file  Intimate Partner Violence: Not on file    Family History:    Family History  Problem Relation Age of Onset  . Diabetes Mother      ROS:  Please see Alyssa history of present illness.  All other ROS reviewed and negative.     Physical Exam/Data:   Vitals:   06/06/20 0200 06/06/20 0240 06/06/20 0400  BP:  (!) 104/55 (!) 148/74  Pulse:  78 80  Resp:  16 18  Temp:  98.7 F (37.1 C)   TempSrc:  Oral   SpO2:  100% 97%  Weight: 81.7 kg 81.7 kg   Height:  '5\' 3"'  (1.6 m)    No intake or output data in Alyssa 24 hours ending 06/06/20 0506 Last 3 Weights 06/06/2020 06/06/2020 04/13/2020  Weight (lbs) 180 lb 1.9 oz 180 lb 1.9 oz 169 lb 12.8 oz  Weight (kg) 81.7 kg 81.7 kg 77.021 kg     Body mass index is 31.91 kg/m.  General:  Well nourished, well developed, in no acute distress  HEENT: normal Neck: no JVD Cardiac:  normal S1, S2; RRR; no murmurs Lungs:  clear to auscultation bilaterally, no wheezing, rhonchi or rales  Abd: soft, nontender, no hepatomegaly  Ext: no edema Musculoskeletal:  No deformities, BUE and BLE strength normal and equal Skin: warm and dry  Neuro:  CNs 2-12 intact, no focal abnormalities noted Psych:  Normal affect   EKG:  Alyssa EKG was personally reviewed and demonstrates:  Sinus rhythm with LBBB  Relevant CV Studies: Echo 09/13/2018 1. Alyssa left ventricle has normal systolic function, with an ejection  fraction of 55-60%. Alyssa cavity size was normal. There is severely  increased left ventricular wall thickness. Left ventricular diastolic  Doppler parameters are consistent with impaired  relaxation.  2. Alyssa right ventricle has normal systolic function. Alyssa cavity was  normal.  3. Left  atrial size was moderately dilated.  4. Trivial pericardial effusion is present.  5. Alyssa mitral valve is abnormal. Mild thickening of Alyssa mitral valve  leaflet. There is moderate mitral annular calcification present.  6. Alyssa tricuspid valve is grossly normal.  7. Alyssa aortic valve is tricuspid. Mild calcification of Alyssa aortic valve.  Aortic valve regurgitation is mild by color flow Doppler. No stenosis of  Alyssa aortic valve.  8. Normal LV systolic function; severe LVH; mild diastolic dysfunction;  moderate LAE; mild AI and MR. 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   LHC 04/30/2018  Severe diffuse involvement of Alyssa circumflex with 70 to 80% proximal to mid stenosis, diffuse 80% and a large second obtuse marginal, and segmental 80% stenosis in Alyssa ostial to proximal first obtuse marginal/ramus.  50% ostial LAD with mid vessel eccentric 65 to 70% narrowing.  Chronic occlusion of Alyssa mid RCA fills by left to right collaterals.  Relatively normal pulmonary artery pressures and normal LV EDP.  Echo demonstrates reduced LVEF 30 to 35%.  RECOMMENDATIONS:   Guideline directed therapy for LV systolic dysfunction  Anti-ischemic therapy with long-acting nitrates  Aggressive lipid-lowering  If unable to control symptoms consider referral for surgical revascularization opinion.  Reluctant to refer Alyssa Alyssa at this time because LAD has only moderate disease.  Circumflex territory has multiple diffusely diseased segments as is typical in diabetic patients.   Laboratory Data:  High Sensitivity Troponin:   Recent Labs  [name] 06/06/20 0227  TROPONINIHS 2,410*     Chemistry Recent Labs  [name] 06/06/20 0227 06/06/20 0258 06/06/20 0259  NA 128* 128* 127*  K 4.7 4.6 4.6  CL 96* 99  --   CO2 18*  --   --   GLUCOSE 661* 676*  --   BUN 32* 38*  --   CREATININE 2.48* 2.10*  --   CALCIUM 8.3*  --   --   GFRNONAA 21*  --   --   ANIONGAP 14  --   --     Recent  Labs  [name] 06/06/20 0227  PROT 6.2*  ALBUMIN 2.7*  AST 19  ALT 14  ALKPHOS 80  BILITOT 1.2   Hematology Recent Labs  [name] 06/06/20 0227 06/06/20 0258 06/06/20 0259  WBC 6.0  --   --   RBC 4.44  --   --   HGB 12.6 14.6 14.3  HCT 40.0 43.0 42.0  MCV 90.1  --   --   MCH 28.4  --   --   MCHC 31.5  --   --   RDW 14.4  --   --   PLT 209  --   --    BNP Recent Labs  [name] 06/06/20 0228  BNP 953.9*    DDimer No results for input(s): DDIMER in Alyssa last 168 hours.   Radiology/Studies:  DG Chest 1 View  Result Date: 06/06/2020 CLINICAL DATA:  Diabetic ketoacidosis, hypotension EXAM: CHEST  1 VIEW COMPARISON:  chest x-ray 04/12/2020 FINDINGS: Alyssa heart size and mediastinal contours are unchanged. Redemonstration of linear atelectasis versus scarring within Alyssa lingula. No focal consolidation. No pulmonary edema. No pleural effusion. No pneumothorax. No acute osseous abnormality. IMPRESSION: No active disease. Electronically Signed   By: Iven Finn M.[name].   On: 06/06/2020 03:33     Assessment and Plan:   1. NSTEMI- Alyssa with known severe CAD who has been followed for some time to determine potential need for CABG. She has had worsening  angina over Alyssa past few weeks and presents with a significant troponin elevation today. Unfortunately, she has had a significant decline in her functional status with her recent COVID pneumonia and I am not sure if she would still be a candidate at this time for surgical revascularization. Would recommend conservative management at this time with heparin and correct of her significant hyperglycemia and hypotension. Cardiology will continue to follow along. - Heparin, ACS nomogram - S/p aspirin 347m  - Continue aspirin 853mdaily  - Continue lipitor 4057m- Restart home coreg and losartan when able to from a BP perspective  - Echo ordered  Risk Assessment/Risk Scores:    TIMI Risk Score for Unstable Angina or Non-ST Elevation MI:   Alyssa  Alyssa's TIMI risk score is 4, which indicates a 20% risk of all cause mortality, new or recurrent myocardial infarction or need for urgent revascularization in Alyssa next 14 days.{    For questions or updates, please contact [email or web address] consult [email or web address] for contact info under    Signed, ConPrincella PellegriniD  06/06/2020 5:06 AM

## 2020-06-06 NOTE — ED Notes (Signed)
Dr.Codero evaluated pt due to hypotension another 1000cc NS administered

## 2020-06-06 NOTE — ED Provider Notes (Signed)
[facility]ERG[facility] DEPARTMENT Provider Note  CSN: [id number] Arrival date & time: 06/06/20 0209  Chief Complaint(s) Diabetic Ketoacidosis and Hypotension  HPI [name] is a 65 y.o. female with a past medical history listed below including hypertension, diabetes, CAD without stenting, CHF with a last EF of 55% who presents to the emergency department by EMS for hyperglycemia and generalized fatigue. Generalized fatigue has been ongoing for 1 week. Patient has not taken her Trulicity over that period of time. EMS called out earlier in the night for elevated blood sugars the family gave the patient Trulicity and decided to wait. EMS called out again 2 hours later after she did not improve. EMS noted that patient's CBG was greater than 600 and that she was hypotensive with systolics in the 99M. EMS gave the patient's 1 L of IV fluid with minimal improvement.  Initially patient was lethargic and slow to respond.  She was given additional liter of IV fluids and question again regarding recent history.  Patient reported recent Covid infection in December.  Denied any infections following that.  She denied any nausea or vomiting.  No abdominal pain or diarrhea.  No current chest pain or shortness of breath.  However patient did report that over the past week she has been having recurrent chest pain at rest that improved with nitroglycerin.   HPI  Past Medical History Past Medical History:  Diagnosis Date  . Anemia 07/17/2013  . Arthritis    "hands, shoulders, hips, legs" (05/10/2018)  . Arthritis of left hip 08/18/2013   S/p total hip arthroplasty.    . CHF (congestive heart failure) ([place])    2014...@ [facility]  . Chronic lower back pain   . CKD (chronic kidney disease) stage 3, GFR 30-59 ml/min (HCC)   . COPD (chronic obstructive pulmonary disease) ([place])   . Healthcare maintenance 06/16/2015  . High cholesterol   . Hypertension   . Influenza A 06/01/2017  . Influenza,  pneumonia 06/01/2017  . Left flank pain 10/11/2016  . Myocardial infarction [facility])    "been told that I've had one; don't know when it would have been" (05/10/2018)  . Problem with sexual relationship 10/13/2017  . PVD (peripheral vascular disease) ([place])   . Shortness of breath 03/12/2019  . Type II diabetes mellitus ([place])    diagnosed 2001   Patient Active Problem List   Diagnosis Date Noted  . NSTEMI (non-ST elevated myocardial infarction) ([place]) 06/06/2020  . COVID-19 04/13/2020  . Musculoskeletal neck pain 02/17/2020  . Abdominal fullness 09/26/2019  . Incomplete uterine prolapse 11/23/2018  . GERD (gastroesophageal reflux disease) 11/14/2018  . Mild depression ([place]) 04/11/2018  . Nonischemic cardiomyopathy ([place]) 08/31/2017  . Acute respiratory failure with hypoxia ([place]) 06/02/2017  . Pain in toes of both feet 01/26/2017  . Bilateral carotid artery disease ([place]) 11/10/2016  . Seasonal allergies 06/30/2016  . Health care maintenance 06/16/2015  . Nonproliferative diabetic retinopathy associated with type 2 diabetes mellitus ([place]) 06/15/2015  . Osteoarthritis of right hip 06/09/2015  . Type II diabetes mellitus with stage 3 chronic kidney disease ([place]) 07/17/2013  . Chronic combined systolic and diastolic CHF, NYHA class 2 ([place]) 07/07/2013  . CKD (chronic kidney disease) stage 3, GFR 30-59 ml/min (HCC) 11/22/2012  . HTN (hypertension) 11/08/2012   Home Medication(s) Prior to Admission medications   Medication Sig Start Date End Date Taking? Authorizing Provider  Accu-Chek FastClix Lancets MISC Check blood sugar up to  3x/day before meals 09/05/18  Yes Seawell, Jaimie A, DO  ACCU-CHEK GUIDE test strip CHECK BLOOD SUGAR UP TO 3X/DAY BEFORE MEALS 11/08/19  Yes Marianna Payment, MD  acetaminophen (TYLENOL) 500 MG tablet Take 1,000 mg by mouth every 6 (six) hours as needed for mild pain, fever or headache.   Yes [provider]  albuterol (PROVENTIL HFA) 108 (90 Base) MCG/ACT inhaler  Inhale 2 puffs into the lungs every 6 (six) hours as needed for wheezing or shortness of breath. 03/12/19  Yes Maudie Mercury, MD  amLODipine (NORVASC) 5 MG tablet Take 1 tablet (5 mg total) by mouth daily. 01/22/20  Yes Kathyrn Drown D, NP  aspirin EC 81 MG tablet Take 1 tablet (81 mg total) by mouth daily. IM program, [facility] 04/22/16  Yes Norman Herrlich, MD  atorvastatin (LIPITOR) 40 MG tablet TAKE 1 TABLET (40 MG TOTAL) BY MOUTH DAILY. 09/29/19  Yes Seawell, Jaimie A, DO  blood glucose meter kit and supplies KIT Dispense based on patient and insurance preference. Use up to four times daily as directed. (FOR ICD-9 250.00, 250.01). 02/28/18  Yes Kathi Ludwig, MD  blood glucose meter kit and supplies KIT Dispense based on patient and insurance preference. Use up to four times daily as directed. (FOR ICD-9 250.00, 250.01). 04/20/20  Yes Sanjuan Dame, MD  Blood Glucose Monitoring Suppl (ACCU-CHEK GUIDE) w/Device KIT 1 each by Does not apply route 3 (three) times daily. 09/05/18  Yes Seawell, Jaimie A, DO  carvedilol (COREG) 12.5 MG tablet TAKE 1 TABLET (12.5 MG TOTAL) BY MOUTH 2 (TWO) TIMES DAILY WITH A MEAL. 02/04/20  Yes Lacinda Axon, MD  Cholecalciferol (VITAMIN D3) 125 MCG (5000 UT) CAPS Take 5,000 Units by mouth daily.   Yes [provider]  Dulaglutide (TRULICITY) 2.76 DY/7.0LK SOPN Inject 0.75 mg into the skin once a week. 09/25/19  Yes Seawell, Jaimie A, DO  empagliflozin (JARDIANCE) 25 MG TABS tablet Take 1 tablet (25 mg total) by mouth daily. 02/17/20 08/15/20 Yes Amponsah, Charisse March, MD  furosemide (LASIX) 20 MG tablet Take 1 tablet (20 mg total) by mouth daily. Patient taking differently: Take 20 mg by mouth daily as needed for fluid. 03/20/19 07/05/19 Yes [name], MD  losartan (COZAAR) 100 MG tablet Take 1 tablet (100 mg total) by mouth daily. 02/17/20  Yes Lacinda Axon, MD  metFORMIN (GLUCOPHAGE-XR) 500 MG 24 hr tablet Take 4 tablets (2,000 mg total)  by mouth daily with breakfast. Patient taking differently: Take 1,000 mg by mouth in the morning and at bedtime. 10/24/19  Yes Seawell, Jaimie A, DO  Multiple Vitamins-Minerals (MULTIVITAMIN GUMMIES ADULT) CHEW Chew 2 tablets by mouth daily.   Yes [provider]  nitroGLYCERIN (NITROSTAT) 0.4 MG SL tablet Place 1 tablet (0.4 mg total) under the tongue every 5 (five) minutes as needed for chest pain. 05/15/20 08/13/20 Yes [name], MD  omeprazole (PRILOSEC) 20 MG capsule TAKE 1 CAPSULE (20 MG TOTAL) BY MOUTH DAILY. 12/12/19  Yes Virl Axe, MD  tiZANidine (ZANAFLEX) 4 MG tablet Take 1 tablet (4 mg total) by mouth every 6 (six) hours as needed for muscle spasms. 02/17/20  Yes Lacinda Axon, MD  UNIFINE PENTIPS 31G X 5 MM MISC USE TO INJECT 20 UNITS DAILY 04/09/19  Yes Seawell, Jaimie A, DO  Past Surgical History Past Surgical History:  Procedure Laterality Date  . ABDOMINAL ANGIOGRAM  12/28/2012  . ABDOMINAL ANGIOGRAM N/A 12/29/2011   Procedure: ABDOMINAL ANGIOGRAM;  Surgeon: Conrad Lubeck, MD;  [facility] C[facility] LAB;  Service: Cardiovascular;  Laterality: N/A;  . ABDOMINAL AORTAGRAM N/A 12/21/2012   Procedure: ABDOMINAL Maxcine Ham;  Surgeon: Elam Dutch, MD;  Location: [facility] CATH LAB;  Service: Cardiovascular;  Laterality: N/A;  . EYE SURGERY Bilateral ~ 2018   "laser; related to my diabetes"  . JOINT REPLACEMENT    . PERIPHERAL VASCULAR CATHETERIZATION     "had to have some veins unclogged cause I was in pain when I walked" (05/10/2018)  . RIGHT/LEFT HEART CATH AND CORONARY ANGIOGRAPHY N/A 04/30/2018   Procedure: RIGHT/LEFT HEART CATH AND CORONARY ANGIOGRAPHY;  Surgeon: [name], MD;  Location: [facility] CV LAB;  Service: Cardiovascular;  Laterality: N/A;  . TOTAL HIP ARTHROPLASTY Left 08/19/2013   Procedure: TOTAL HIP ARTHROPLASTY;   Surgeon: Kerin Salen, MD;  Location: [facility];  Service: Orthopedics;  Laterality: Left;  . TUBAL LIGATION     Family History Family History  Problem Relation Age of Onset  . Diabetes Mother     Social History Social History   Tobacco Use  . Smoking status: Former Smoker    Years: 7.00    Types: Cigarettes    Quit date: 05/08/1987    Years since quitting: 33.1  . Smokeless tobacco: Never Used  . Tobacco comment: 05/10/2018 "smoked when I drank"  Vaping Use  . Vaping Use: Never used  Substance Use Topics  . Alcohol use: Not Currently    Alcohol/week: 0.0 standard drinks    Comment: 05/10/2018 "glass of wine on holidays"  . Drug use: Not Currently   Allergies Lisinopril and Plavix [clopidogrel bisulfate]  Review of Systems Review of Systems All other systems are reviewed and are negative for acute change except as noted in the HPI  Physical Exam Vital Signs  I have reviewed the triage vital signs BP (!) 148/74   Pulse 80   Temp 98.7 F (37.1 C) (Oral)   Resp 18   Ht '5\' 3"'  (1.6 m)   Wt 81.7 kg   SpO2 97%   BMI 31.91 kg/m   Physical Exam Vitals reviewed.  Constitutional:      General: She is not in acute distress.    Appearance: She is well-developed and well-nourished. She is not diaphoretic.  HENT:     Head: Normocephalic and atraumatic.     Nose: Nose normal.  Eyes:     General: No scleral icterus.       Right eye: No discharge.        Left eye: No discharge.     Extraocular Movements: EOM normal.     Conjunctiva/sclera: Conjunctivae normal.     Pupils: Pupils are equal, round, and reactive to light.  Cardiovascular:     Rate and Rhythm: Normal rate and regular rhythm.     Heart sounds: No murmur heard. No friction rub. No gallop.   Pulmonary:     Effort: Pulmonary effort is normal. No respiratory distress.     Breath sounds: Normal breath sounds. No stridor. No rales.  Abdominal:     General: There is no distension.     Palpations: Abdomen is soft.      Tenderness: There is no abdominal tenderness.  Musculoskeletal:        General: No tenderness or edema.  Cervical back: Normal range of motion and neck supple.  Skin:    General: Skin is warm and dry.     Findings: No erythema or rash.  Neurological:     Mental Status: She is oriented to person, place, and time. She is lethargic.  Psychiatric:        Mood and Affect: Mood and affect normal.     ED Results and Treatments Labs (all labs ordered are listed, but only abnormal results are displayed) Labs Reviewed  LACTIC ACID, PLASMA - Abnormal; Notable for the following components:      Result Value   Lactic Acid, Venous 2.2 (*)    All other components within normal limits  COMPREHENSIVE METABOLIC PANEL - Abnormal; Notable for the following components:   Sodium 128 (*)    Chloride 96 (*)    CO2 18 (*)    Glucose, Bld 661 (*)    BUN 32 (*)    Creatinine, Ser 2.48 (*)    Calcium 8.3 (*)    Total Protein 6.2 (*)    Albumin 2.7 (*)    GFR, Estimated 21 (*)    All other components within normal limits  BRAIN NATRIURETIC PEPTIDE - Abnormal; Notable for the following components:   B Natriuretic Peptide 953.9 (*)    All other components within normal limits  CBG MONITORING, ED - Abnormal; Notable for the following components:   Glucose-Capillary 595 (*)    All other components within normal limits  CBG MONITORING, ED - Abnormal; Notable for the following components:   Glucose-Capillary 588 (*)    All other components within normal limits  I-STAT CHEM 8, ED - Abnormal; Notable for the following components:   Sodium 128 (*)    BUN 38 (*)    Creatinine, Ser 2.10 (*)    Glucose, Bld 676 (*)    Calcium, Ion 1.02 (*)    TCO2 20 (*)    All other components within normal limits  I-STAT VENOUS BLOOD GAS, ED - Abnormal; Notable for the following components:   pCO2, Ven 38.3 (*)    pO2, Ven 124.0 (*)    Bicarbonate 19.9 (*)    TCO2 21 (*)    Acid-base deficit 6.0 (*)    Sodium  127 (*)    Calcium, Ion 1.01 (*)    All other components within normal limits  CBG MONITORING, ED - Abnormal; Notable for the following components:   Glucose-Capillary 557 (*)    All other components within normal limits  CBG MONITORING, ED - Abnormal; Notable for the following components:   Glucose-Capillary 409 (*)    All other components within normal limits  CBG MONITORING, ED - Abnormal; Notable for the following components:   Glucose-Capillary 290 (*)    All other components within normal limits  TROPONIN I (HIGH SENSITIVITY) - Abnormal; Notable for the following components:   Troponin I (High Sensitivity) 2,410 (*)    All other components within normal limits  CBC WITH DIFFERENTIAL/PLATELET  URINALYSIS, COMPLETE (UACMP) WITH MICROSCOPIC  LACTIC ACID, PLASMA  HEPARIN LEVEL (UNFRACTIONATED)  BETA-HYDROXYBUTYRIC ACID  TROPONIN I (HIGH SENSITIVITY)  EKG  EKG Interpretation  Date/Time:  Saturday June 06 2020 03:39:16 EST Ventricular Rate:  79 PR Interval:  126 QRS Duration: 144 QT Interval:  450 QTC Calculation: 516 R Axis:   23 Text Interpretation: Normal sinus rhythm Left bundle branch block Abnormal ECG No acute changes Confirmed by Addison Lank [id number]) on 06/06/2020 3:43:06 AM      Radiology DG Chest 1 View  Result Date: 06/06/2020 CLINICAL DATA:  Diabetic ketoacidosis, hypotension EXAM: CHEST  1 VIEW COMPARISON:  chest x-ray 04/12/2020 FINDINGS: The heart size and mediastinal contours are unchanged. Redemonstration of linear atelectasis versus scarring within the lingula. No focal consolidation. No pulmonary edema. No pleural effusion. No pneumothorax. No acute osseous abnormality. IMPRESSION: No active disease. Electronically Signed   By: Iven Finn M.D.   On: 06/06/2020 03:33    Pertinent labs & imaging results that were available during my  care of the patient were reviewed by me and considered in my medical decision making (see chart for details).  Medications Ordered in ED Medications  insulin regular, human (MYXREDLIN) 100 units/ 100 mL infusion (6 Units/hr Intravenous Rate/Dose Change 06/06/20 0616)  dextrose 5 % in lactated ringers infusion (0 mLs Intravenous Hold 06/06/20 0351)  dextrose 50 % solution 0-50 mL (has no administration in time range)  heparin ADULT infusion 100 units/mL (25000 units/240m) (850 Units/hr Intravenous New Bag/Given 06/06/20 0535)  acetaminophen (TYLENOL) tablet 1,000 mg (has no administration in time range)  atorvastatin (LIPITOR) tablet 40 mg (has no administration in time range)  pantoprazole (PROTONIX) EC tablet 40 mg (has no administration in time range)  albuterol (VENTOLIN HFA) 108 (90 Base) MCG/ACT inhaler 2 puff (has no administration in time range)  aspirin EC tablet 81 mg (has no administration in time range)  nitroGLYCERIN (NITROSTAT) SL tablet 0.4 mg (has no administration in time range)  ondansetron (ZOFRAN) injection 4 mg (has no administration in time range)  sodium chloride 0.9 % bolus 1,634 mL (1,634 mLs Intravenous New Bag/Given 06/06/20 0240)  heparin bolus via infusion 4,000 Units (4,000 Units Intravenous Bolus from Bag 06/06/20 0534)  aspirin chewable tablet 324 mg (324 mg Oral Given 06/06/20 0616)  0.9 %  sodium chloride infusion ( Intravenous New Bag/Given 06/06/20 0518)                                                                                                                                    Procedures .1-3 Lead EKG Interpretation Performed by: CFatima Blank MD Authorized by: CFatima Blank MD     Interpretation: normal     ECG rate:  79   ECG rate assessment: normal     Rhythm: sinus rhythm     Ectopy: none     Conduction: normal   .Critical Care Performed by: CFatima Blank MD Authorized by: CFatima Blank MD   Critical care  provider statement:    Critical care time (  minutes):  45   Critical care was necessary to treat or prevent imminent or life-threatening deterioration of the following conditions:  Cardiac failure, endocrine crisis and circulatory failure   Critical care was time spent personally by me on the following activities:  Discussions with consultants, evaluation of patient's response to treatment, examination of patient, ordering and performing treatments and interventions, ordering and review of laboratory studies, ordering and review of radiographic studies, pulse oximetry, re-evaluation of patient's condition, obtaining history from patient or surrogate and review of old charts    (including critical care time)  Medical Decision Making / ED Course I have reviewed the nursing notes for this encounter and the patient's prior records (if available in EHR or on provided paperwork).   SARIT SPARANO was evaluated in Emergency Department on 06/06/2020 for the symptoms described in the history of present illness. She was evaluated in the context of the global COVID-19 pandemic, which necessitated consideration that the patient might be at risk for infection with the SARS-CoV-2 virus that causes COVID-19. Institutional protocols and algorithms that pertain to the evaluation of patients at risk for COVID-19 are in a state of rapid change based on information released by regulatory bodies including the CDC and federal and state organizations. These policies and algorithms were followed during the patient's care in the ED.  Hyperglycemia. Labs without evidence of DKA. Provided with additional IV fluids and started on an insulin drip. Patient is afebrile and denies any source of infections other than Covid 2 months ago. Chest x-ray without evidence of pneumonia. UA still needs to be collected.  Consulted internal medicine who will admit patient for continued work-up and  management. -----------------------------------------------  Hypotension. Improved with additional 1.5 L. Remained stable w/o additional IVF. Patient is currently chest pain-free but given the hypotension, recent chest pains, hyperglycemia, I obtain EKG cardiac markers and chest x-ray. EKG without acute ischemic changes or evidence of pericarditis.  Stable left bundle branch block. Initial troponin elevated at 2400.  This is concerning for NSTEMI.  She was started on heparin and given aspirin.  BNP also elevated greater than 900. Chest x-ray without evidence of pulmonary edema and patient did not appear to be volume overloaded.  Cardiology was consulted who evaluated the patient in the emergency department and will continue to follow during admission.  They agree with heparin and aspirin.  It was scheduled to repeat echocardiogram in the morning.  -----------------------------------------------------      Final Clinical Impression(s) / ED Diagnoses Final diagnoses:  Hypotension  NSTEMI (non-ST elevated myocardial infarction) ([place])  Hyperglycemia      This chart was dictated using voice recognition software.  Despite best efforts to proofread,  errors can occur which can change the documentation meaning.   Fatima Blank, MD 06/06/20 [phone]

## 2020-06-06 NOTE — ED Notes (Signed)
CBG 141, not crossing into Epic

## 2020-06-06 NOTE — ED Notes (Signed)
Pt placed on bed pan for a BM. Brief and sheets changed. purewick applied Pt able to assist staff with rolling and able to follow commands Shortness of breath with exertion

## 2020-06-06 NOTE — Progress Notes (Signed)
  Echocardiogram 2D Echocardiogram has been performed.  Delissa Silba G Madaline Lefeber 06/06/2020, 12:12 PM

## 2020-06-06 NOTE — Progress Notes (Signed)
Subjective:  Patient endorses some improvement in her dizziness at this time. She denies any nausea/vomiting or any chest pain at this time.  History of urinary incontinence at baseline but no actual dysuria.   Objective:  Vital signs in last 24 hours: Vitals:   06/06/20 0200 06/06/20 0240 06/06/20 0400 06/06/20 0619  BP:  (!) 104/55 (!) 148/74 (!) 90/57  Pulse:  78 80 83  Resp:  16 18 (!) 22  Temp:  98.7 F (37.1 C)    TempSrc:  Oral    SpO2:  100% 97% 99%  Weight: 81.7 kg 81.7 kg    Height:  '5\' 3"'$  (1.6 m)     Physical Exam Constitutional:      Appearance: Normal appearance.  HENT:     Head: Normocephalic and atraumatic.     Right Ear: External ear normal.     Left Ear: External ear normal.     Nose: Nose normal.     Mouth/Throat:     Mouth: Mucous membranes are dry.     Pharynx: Oropharynx is clear.  Eyes:     Conjunctiva/sclera: Conjunctivae normal.     Pupils: Pupils are equal, round, and reactive to light.  Cardiovascular:     Rate and Rhythm: Normal rate and regular rhythm.  Pulmonary:     Effort: Pulmonary effort is normal.     Breath sounds: Normal breath sounds. No rhonchi.  Abdominal:     General: Abdomen is flat. Bowel sounds are normal.     Palpations: Abdomen is soft.  Musculoskeletal:     Right lower leg: No edema.     Left lower leg: No edema.  Skin:    General: Skin is warm and dry.     Capillary Refill: Capillary refill takes less than 2 seconds.  Neurological:     General: No focal deficit present.     Mental Status: She is alert. Mental status is at baseline.  Psychiatric:        Mood and Affect: Mood normal.        Behavior: Behavior normal.      Assessment/Plan:  Active Problems:   NSTEMI (non-ST elevated myocardial infarction) ([place])  [name] is a 65 y/o female with history of type II DM, HFrecEF, CAD, COPD, HTN, CKD IIIb and PVD who presents with one week of intermittent chest pain.   # NSTEMI  Chest pain now resolved.   Patient has history of DM with multi-vessel obstructive CAD. Troponins 2410 > 2238, BNP 953.9, LA 1.3. EKG without acute changes, LBB c/w with prior. Likely NSTEMI in the setting of DKA and hypotension. ECHO with reduced ED of 25-30% with pericardial effusion. Low suspision for tamponade as BP improving with fluids.   - Cardiology consulted, appreciate their recommendations - Continue conservative management due to significant comorbidities - Continue heparin - ASA '81mg'$  daily - Lipitor '40mg'$  daily  - NTG and Zofran PRN - Restart Coreg and Losartan when able  - Continuous telemetry and pulse ox   # Hypotension Blood pressures in 90s during exam. Received 1.6L NS bolus in the ED and additional liter overnight. Mental status back to baseline . Likely due to dehydration in the setting of poor PO intake and DKA, likely 2/2 NSTEMI vs  infectious etiology. Lactic acidosis resolved, afebrile, no leukocytosis. Continues to appear dry on exam.  - Give additional 1L NS bolus  - Orthostatics - follow up blood cultures and UA - Continue to monitor   #  DKA  Presented with hyperglycemia with glucose in 600s. Not been taking Jardiance and Trulicity at home. Lactic acidosis resolved this morning at 1.3. Bicarb of 17 no anion gap. Beta hydroxybyterate remains elevated at 2.9. CBGs rapidly improved now 205 -Continue endotool protocol - BMP q4, beta hydroxybutyrate q8 - 1 L NS bolus - Monitor CBG - hold Jardiance  # AKI on CKD Creatinine 2.48>2.11. Baseline creatinine ~1.7. Likely prerenal in the setting of NSTEMI and decreased PO intake. Improving with IV fluids -Give additional liter NS  # HFrEF Cardiac catheterization in 2019 showed reduced LVEF 30-35%, although EF normalized on most recent ECHO 08/2018 with GDMT despite severely increased LV wall thickness and continued diastolic dysfunction. 2/5 Echo with EF of 25-30% with global hypokinesis and reademontration of severe concentric LVH. Grade 1  diastolic dysfunction. On exam does not appear fluid overloaded  - hold losartan and coreg in setting of hypotension - Daily weights - Strict I&O  # History of Aortic Regurgitation ECHO from 08/2018 showed mild aortic valve regurgitation. Stable on echo today.  # HLD - Continue home atorvastatin '40mg'$  daily   # GERD - Continue Protonix '40mg'$  daily  Code Status: Full code  Diet: NPO  IVF: D5LR 144m/hr  DVT PPx: On heparin infusion  Prior to Admission Living Arrangement: Live at home with husband Anticipated Discharge Location: Home Barriers to Discharge: Medical mangement Dispo: Anticipated discharge in approximately 3  day(s).   JIona Beard MD 06/06/2020, 7:40 AM Pager: [phone] 5pm on weekdays and 1pm on weekends: On Call pager [phone]

## 2020-06-07 ENCOUNTER — Other Ambulatory Visit: Payer: Self-pay

## 2020-06-07 DIAGNOSIS — I214 Non-ST elevation (NSTEMI) myocardial infarction: Secondary | ICD-10-CM | POA: Diagnosis not present

## 2020-06-07 DIAGNOSIS — E1122 Type 2 diabetes mellitus with diabetic chronic kidney disease: Secondary | ICD-10-CM

## 2020-06-07 DIAGNOSIS — I5042 Chronic combined systolic (congestive) and diastolic (congestive) heart failure: Secondary | ICD-10-CM

## 2020-06-07 DIAGNOSIS — R739 Hyperglycemia, unspecified: Secondary | ICD-10-CM

## 2020-06-07 LAB — BASIC METABOLIC PANEL
Anion gap: 9 (ref 5–15)
BUN: 16 mg/dL (ref 8–23)
CO2: 19 mmol/L — ABNORMAL LOW (ref 22–32)
Calcium: 8.2 mg/dL — ABNORMAL LOW (ref 8.9–10.3)
Chloride: 111 mmol/L (ref 98–111)
Creatinine, Ser: 1.8 mg/dL — ABNORMAL HIGH (ref 0.44–1.00)
GFR, Estimated: 31 mL/min — ABNORMAL LOW (ref >60)
Glucose, Bld: 117 mg/dL — ABNORMAL HIGH (ref 70–99)
Potassium: 3.7 mmol/L (ref 3.5–5.1)
Sodium: 139 mmol/L (ref 135–145)

## 2020-06-07 LAB — BASIC METABOLIC PANEL WITH GFR
Anion gap: 12 (ref 5–15)
Anion gap: 13 (ref 5–15)
BUN: 18 mg/dL (ref 8–23)
BUN: 20 mg/dL (ref 8–23)
CO2: 17 mmol/L — ABNORMAL LOW (ref 22–32)
CO2: 18 mmol/L — ABNORMAL LOW (ref 22–32)
Calcium: 8.2 mg/dL — ABNORMAL LOW (ref 8.9–10.3)
Calcium: 8.6 mg/dL — ABNORMAL LOW (ref 8.9–10.3)
Chloride: 110 mmol/L (ref 98–111)
Chloride: 111 mmol/L (ref 98–111)
Creatinine, Ser: 1.8 mg/dL — ABNORMAL HIGH (ref 0.44–1.00)
Creatinine, Ser: 1.82 mg/dL — ABNORMAL HIGH (ref 0.44–1.00)
GFR, Estimated: 31 mL/min — ABNORMAL LOW
GFR, Estimated: 31 mL/min — ABNORMAL LOW
Glucose, Bld: 111 mg/dL — ABNORMAL HIGH (ref 70–99)
Glucose, Bld: 98 mg/dL (ref 70–99)
Potassium: 3.4 mmol/L — ABNORMAL LOW (ref 3.5–5.1)
Potassium: 3.9 mmol/L (ref 3.5–5.1)
Sodium: 140 mmol/L (ref 135–145)
Sodium: 141 mmol/L (ref 135–145)

## 2020-06-07 LAB — URINALYSIS, COMPLETE (UACMP) WITH MICROSCOPIC
Bilirubin Urine: NEGATIVE
Glucose, UA: >=500 mg/dL — AB
Ketones, ur: 5 mg/dL — AB
Nitrite: NEGATIVE
Protein, ur: NEGATIVE mg/dL
Specific Gravity, Urine: 1.02 (ref 1.005–1.030)
pH: 5 (ref 5.0–8.0)

## 2020-06-07 LAB — GLUCOSE, CAPILLARY
Glucose-Capillary: 146 mg/dL — ABNORMAL HIGH (ref 70–99)
Glucose-Capillary: 264 mg/dL — ABNORMAL HIGH (ref 70–99)

## 2020-06-07 LAB — HEPARIN LEVEL (UNFRACTIONATED): Heparin Unfractionated: 0.35 IU/mL (ref 0.30–0.70)

## 2020-06-07 LAB — CBG MONITORING, ED
Glucose-Capillary: 113 mg/dL — ABNORMAL HIGH (ref 70–99)
Glucose-Capillary: 130 mg/dL — ABNORMAL HIGH (ref 70–99)
Glucose-Capillary: 138 mg/dL — ABNORMAL HIGH (ref 70–99)
Glucose-Capillary: 121 mg/dL — ABNORMAL HIGH (ref 70–99)

## 2020-06-07 LAB — BETA-HYDROXYBUTYRIC ACID
Beta-Hydroxybutyric Acid: 1.9 mmol/L — ABNORMAL HIGH (ref 0.05–0.27)
Beta-Hydroxybutyric Acid: 0.11 mmol/L (ref 0.05–0.27)

## 2020-06-07 LAB — CBC
HCT: 36.1 % (ref 36.0–46.0)
Hemoglobin: 11.3 g/dL — ABNORMAL LOW (ref 12.0–15.0)
MCH: 28.6 pg (ref 26.0–34.0)
MCHC: 31.3 g/dL (ref 30.0–36.0)
MCV: 91.4 fL (ref 80.0–100.0)
Platelets: 194 10*3/uL (ref 150–400)
RBC: 3.95 MIL/uL (ref 3.87–5.11)
RDW: 15 % (ref 11.5–15.5)
WBC: 6.6 10*3/uL (ref 4.0–10.5)
nRBC: 0 % (ref 0.0–0.2)

## 2020-06-07 MED ORDER — POLYETHYLENE GLYCOL 3350 17 G PO PACK
17.0000 g | PACK | Freq: Every day | ORAL | Status: DC | PRN
  Administered 2020-06-07 – 2020-06-10 (×3): 17 g via ORAL
  Filled 2020-06-07 (×3): qty 1

## 2020-06-07 MED ORDER — INFLUENZA VAC SPLIT QUAD 0.5 ML IM SUSY
0.5000 mL | PREFILLED_SYRINGE | INTRAMUSCULAR | Status: AC
  Administered 2020-06-10: 0.5 mL via INTRAMUSCULAR
  Filled 2020-06-07 (×2): qty 0.5

## 2020-06-07 MED ORDER — AMLODIPINE BESYLATE 10 MG PO TABS
10.0000 mg | ORAL_TABLET | Freq: Every day | ORAL | Status: DC
  Administered 2020-06-08 – 2020-06-10 (×3): 10 mg via ORAL
  Filled 2020-06-07 (×3): qty 1

## 2020-06-07 MED ORDER — POTASSIUM CHLORIDE 20 MEQ PO PACK
40.0000 meq | PACK | Freq: Once | ORAL | Status: AC
  Administered 2020-06-07: 40 meq via ORAL
  Filled 2020-06-07: qty 2

## 2020-06-07 MED ORDER — SENNA 8.6 MG PO TABS
1.0000 | ORAL_TABLET | Freq: Every day | ORAL | Status: DC | PRN
  Administered 2020-06-07 – 2020-06-10 (×3): 8.6 mg via ORAL
  Filled 2020-06-07 (×3): qty 1

## 2020-06-07 MED ORDER — POTASSIUM CHLORIDE 10 MEQ/100ML IV SOLN: 10.0000 meq | INTRAVENOUS | Status: DC

## 2020-06-07 MED ORDER — INSULIN GLARGINE 100 UNIT/ML ~~LOC~~ SOLN
10.0000 [IU] | Freq: Once | SUBCUTANEOUS | Status: AC
  Administered 2020-06-07: 10 [IU] via SUBCUTANEOUS
  Filled 2020-06-07: qty 0.1

## 2020-06-07 MED ORDER — AMLODIPINE BESYLATE 5 MG PO TABS
5.0000 mg | ORAL_TABLET | Freq: Every day | ORAL | Status: DC
  Administered 2020-06-07: 5 mg via ORAL
  Filled 2020-06-07: qty 1

## 2020-06-07 MED ORDER — SENNA 8.6 MG PO TABS: 1.0000 | ORAL_TABLET | Freq: Every day | ORAL | Status: DC | PRN

## 2020-06-07 MED ORDER — INSULIN ASPART 100 UNIT/ML ~~LOC~~ SOLN
0.0000 [IU] | Freq: Three times a day (TID) | SUBCUTANEOUS | Status: DC
  Administered 2020-06-07 (×2): 2 [IU] via SUBCUTANEOUS
  Administered 2020-06-08 (×3): 5 [IU] via SUBCUTANEOUS
  Administered 2020-06-09: 8 [IU] via SUBCUTANEOUS
  Administered 2020-06-09: 3 [IU] via SUBCUTANEOUS
  Administered 2020-06-09: 2 [IU] via SUBCUTANEOUS
  Administered 2020-06-10: 11 [IU] via SUBCUTANEOUS
  Administered 2020-06-10: 2 [IU] via SUBCUTANEOUS

## 2020-06-07 MED ORDER — INSULIN GLARGINE 100 UNIT/ML ~~LOC~~ SOLN
10.0000 [IU] | Freq: Every day | SUBCUTANEOUS | Status: DC
  Administered 2020-06-07: 10 [IU] via SUBCUTANEOUS
  Filled 2020-06-07 (×3): qty 0.1

## 2020-06-07 MED ORDER — CARVEDILOL 12.5 MG PO TABS
12.5000 mg | ORAL_TABLET | Freq: Two times a day (BID) | ORAL | Status: DC
  Administered 2020-06-07 – 2020-06-12 (×9): 12.5 mg via ORAL
  Filled 2020-06-07: qty 2
  Filled 2020-06-07: qty 1
  Filled 2020-06-07: qty 2
  Filled 2020-06-07 (×6): qty 1

## 2020-06-07 NOTE — Progress Notes (Signed)
[place] for Heparin Indication: chest pain/ACS  Allergies  Allergen Reactions  . Lisinopril Cough  . Plavix [Clopidogrel Bisulfate] Rash    Patient Measurements: Height: '5\' 3"'$  (160 cm) Weight: 81.7 kg (180 lb 1.9 oz) IBW/kg (Calculated) : 52.4 Heparin Dosing Weight: 69 kg  Vital Signs: BP: 152/77 (02/06 0730) Pulse Rate: 84 (02/06 0730)  Labs: Recent Labs    06/06/20 0227 06/06/20 0258 06/06/20 0259 06/06/20 0455 06/06/20 1140 06/06/20 1141 06/06/20 1738 06/07/20 0005 06/07/20 0346 06/07/20 0809  HGB 12.6 14.6 14.3  --   --   --   --   --  11.3*  --   HCT 40.0 43.0 42.0  --   --   --   --   --  36.1  --   PLT 209  --   --   --   --   --   --   --  194  --   HEPARINUNFRC  --   --   --   --   --  0.50  --   --  0.35  --   CREATININE 2.48* 2.10*  --   --    < >  --    < > 1.80* 1.82* 1.80*  TROPONINIHS 2,410*  --   --  2,238*  --   --   --   --   --   --    < > = values in this interval not displayed.    Estimated Creatinine Clearance: 32.4 mL/min (A) (by C-G formula based on SCr of 1.8 mg/dL (H)).   Medical History: Past Medical History:  Diagnosis Date  . Anemia 07/17/2013  . Arthritis    "hands, shoulders, hips, legs" (05/10/2018)  . Arthritis of left hip 08/18/2013   S/p total hip arthroplasty.    . CHF (congestive heart failure) ([place])    2014...@ [facility]  . Chronic lower back pain   . CKD (chronic kidney disease) stage 3, GFR 30-59 ml/min (HCC)   . COPD (chronic obstructive pulmonary disease) ([place])   . Healthcare maintenance 06/16/2015  . High cholesterol   . Hypertension   . Influenza A 06/01/2017  . Influenza, pneumonia 06/01/2017  . Left flank pain 10/11/2016  . Myocardial infarction [facility])    "been told that I've had one; don't know when it would have been" (05/10/2018)  . Problem with sexual relationship 10/13/2017  . PVD (peripheral vascular disease) ([place])   . Shortness of breath 03/12/2019  . Type II diabetes  mellitus ([place])    diagnosed 2001    Medications:  See electronic med rec  Assessment: 65 y.o. F presents with CP. To begin heparin for ACS. No AC PTA. CBC ok at baseline.  Heparin level this AM remains therapeutic, however downtrend on 850 units/hr  Goal of Therapy:  Heparin level 0.3-0.7 units/ml Monitor platelets by anticoagulation protocol: Yes   Plan:  Slight increase of heparin gtt to 900 units/hr Daily heparin level, CBC, s/s bleeding Await cards plan  Bertis Ruddy, PharmD Clinical Pharmacist ED Pharmacist Phone # [phone] 06/07/2020 10:31 AM

## 2020-06-07 NOTE — ED Notes (Signed)
Lunch Tray Ordered @ 1105. 

## 2020-06-07 NOTE — Progress Notes (Signed)
Progress Note  Patient Name: [name] Date of Encounter: 06/07/2020  Primary Cardiologist: Sinclair Grooms, MD   Subjective   Feels worn out. No CP today.   Inpatient Medications    Scheduled Meds: . aspirin EC  81 mg Oral Daily  . atorvastatin  40 mg Oral q1800  . insulin glargine  10 Units Subcutaneous Once  . pantoprazole  40 mg Oral Daily   Continuous Infusions: . heparin 850 Units/hr (06/06/20 0535)  . insulin 2 Units/hr (06/07/20 0729)   PRN Meds: acetaminophen, albuterol, dextrose, nitroGLYCERIN, ondansetron (ZOFRAN) IV   Vital Signs    Vitals:   06/07/20 0430 06/07/20 0500 06/07/20 0530 06/07/20 0600  BP: (!) 148/65 140/62 136/70 (!) 148/70  Pulse: 81 84 80 78  Resp: (!) '25 20 20 '$ (!) 22  Temp:      TempSrc:      SpO2: 95% 99% 97% 97%  Weight:      Height:       No intake or output data in the 24 hours ending 06/07/20 0830 Filed Weights   06/06/20 0200 06/06/20 0240  Weight: 81.7 kg 81.7 kg    Telemetry    SR, LBBB - Personally Reviewed  ECG    Sr, lbbb - Personally Reviewed  Physical Exam   GEN: No acute distress.   Neck: No JVD Cardiac: regular rhythm, normal rate, no murmurs, rubs, or gallops.  Respiratory: Clear to auscultation bilaterally. GI: Soft, nontender, non-distended  MS: No edema; No deformity. Warm extremities. Neuro:  Nonfocal  Psych: Normal affect   Labs    Chemistry Recent Labs  Lab 06/06/20 0227 06/06/20 0258 06/06/20 1738 06/07/20 0005 06/07/20 0346  NA 128*   < > 138 140 141  K 4.7   < > 3.5 3.4* 3.9  CL 96*   < > 110 111 110  CO2 18*   < > 17* 17* 18*  GLUCOSE 661*   < > 116* 98 111*  BUN 32*   < > '23 20 18  '$ CREATININE 2.48*   < > 1.89* 1.80* 1.82*  CALCIUM 8.3*   < > 8.0* 8.2* 8.6*  PROT 6.2*  --   --   --   --   ALBUMIN 2.7*  --   --   --   --   AST 19  --   --   --   --   ALT 14  --   --   --   --   ALKPHOS 80  --   --   --   --   BILITOT 1.2  --   --   --   --   GFRNONAA 21*   < > 29* 31*  31*  ANIONGAP 14   < > '11 12 13   '$ < > = values in this interval not displayed.     Hematology Recent Labs  Lab 06/06/20 0227 06/06/20 0258 06/06/20 0259 06/07/20 0346  WBC 6.0  --   --  6.6  RBC 4.44  --   --  3.95  HGB 12.6 14.6 14.3 11.3*  HCT 40.0 43.0 42.0 36.1  MCV 90.1  --   --  91.4  MCH 28.4  --   --  28.6  MCHC 31.5  --   --  31.3  RDW 14.4  --   --  15.0  PLT 209  --   --  194    Cardiac EnzymesNo results for  input(s): TROPONINI in the last 168 hours. No results for input(s): TROPIPOC in the last 168 hours.   BNP Recent Labs  Lab 06/06/20 0228  BNP 953.9*     DDimer No results for input(s): DDIMER in the last 168 hours.   Radiology    DG Chest 1 View  Result Date: 06/06/2020 CLINICAL DATA:  Diabetic ketoacidosis, hypotension EXAM: CHEST  1 VIEW COMPARISON:  chest x-ray 04/12/2020 FINDINGS: The heart size and mediastinal contours are unchanged. Redemonstration of linear atelectasis versus scarring within the lingula. No focal consolidation. No pulmonary edema. No pleural effusion. No pneumothorax. No acute osseous abnormality. IMPRESSION: No active disease. Electronically Signed   By: Iven Finn M.D.   On: 06/06/2020 03:33   ECHOCARDIOGRAM COMPLETE  Result Date: 06/06/2020    ECHOCARDIOGRAM REPORT   Patient Name:   [name] Date of Exam: 06/06/2020 Medical Rec #:  [id number]      Height:       63.0 in Accession #:    [id number]     Weight:       180.1 lb Date of Birth:  1955-06-28     BSA:          1.849 m Patient Age:    65 years       BP:           128/68 mmHg Patient Gender: F              HR:           81 bpm. Exam Location:  Inpatient Procedure: 2D Echo, Cardiac Doppler and Color Doppler Indications:    NSTEMI I21.4  History:        Patient has prior history of Echocardiogram examinations, most                 recent 09/13/2018. CHF, Previous Myocardial Infarction, COPD,                 Signs/Symptoms:Dyspnea; Risk Factors:Hypertension, Diabetes and                  Dyslipidemia. COVID-19. CKD.  Sonographer:    Jonelle Sidle Dance Referring Phys: [place]  1. When compared to the prior study from 09/13/2018 LVEF has decreased 55-60% to 25-30% with diffuse hypokinesis.  2. Left ventricular ejection fraction, by estimation, is 25 to 30%. The left ventricle has severely decreased function. The left ventricle demonstrates global hypokinesis. There is severe concentric left ventricular hypertrophy. Left ventricular diastolic parameters are consistent with Grade I diastolic dysfunction (impaired relaxation). Elevated left ventricular end-diastolic pressure.  3. Right ventricular systolic function is normal. The right ventricular size is normal.  4. Left atrial size was mildly dilated.  5. The pericardial effusion is posterior to the left ventricle.  6. The mitral valve is normal in structure. Mild mitral valve regurgitation. No evidence of mitral stenosis.  7. The aortic valve is normal in structure. There is moderate calcification of the aortic valve. There is moderate thickening of the aortic valve. Aortic valve regurgitation is mild. Mild to moderate aortic valve sclerosis/calcification is present, without any evidence of aortic stenosis.  8. The inferior vena cava is normal in size with greater than 50% respiratory variability, suggesting right atrial pressure of 3 mmHg. FINDINGS  Left Ventricle: Left ventricular ejection fraction, by estimation, is 25 to 30%. The left ventricle has severely decreased function. The left ventricle demonstrates global hypokinesis. The left ventricular internal cavity size was normal  in size. There is severe concentric left ventricular hypertrophy. Left ventricular diastolic parameters are consistent with Grade I diastolic dysfunction (impaired relaxation). Elevated left ventricular end-diastolic pressure. Right Ventricle: The right ventricular size is normal. No increase in right ventricular wall thickness. Right  ventricular systolic function is normal. Left Atrium: Left atrial size was mildly dilated. Right Atrium: Right atrial size was normal in size. Pericardium: Trivial pericardial effusion is present. The pericardial effusion is posterior to the left ventricle. Mitral Valve: The mitral valve is normal in structure. Mild mitral valve regurgitation. No evidence of mitral valve stenosis. Tricuspid Valve: The tricuspid valve is normal in structure. Tricuspid valve regurgitation is not demonstrated. No evidence of tricuspid stenosis. Aortic Valve: The aortic valve is normal in structure. There is moderate calcification of the aortic valve. There is moderate thickening of the aortic valve. Aortic valve regurgitation is mild. Aortic regurgitation PHT measures 325 msec. Mild to moderate  aortic valve sclerosis/calcification is present, without any evidence of aortic stenosis. Pulmonic Valve: The pulmonic valve was normal in structure. Pulmonic valve regurgitation is not visualized. No evidence of pulmonic stenosis. Aorta: The aortic root is normal in size and structure. Venous: The inferior vena cava is normal in size with greater than 50% respiratory variability, suggesting right atrial pressure of 3 mmHg. IAS/Shunts: No atrial level shunt detected by color flow Doppler.  LEFT VENTRICLE PLAX 2D LVIDd:         4.00 cm  Diastology LVIDs:         3.40 cm  LV e' medial:    4.03 cm/s LV PW:         1.60 cm  LV E/e' medial:  19.0 LV IVS:        1.50 cm  LV e' lateral:   4.47 cm/s LVOT diam:     1.80 cm  LV E/e' lateral: 17.1 LV SV:         53 LV SV Index:   29 LVOT Area:     2.54 cm  RIGHT VENTRICLE             IVC RV Basal diam:  2.30 cm     IVC diam: 2.20 cm RV S prime:     12.40 cm/s TAPSE (M-mode): 2.1 cm LEFT ATRIUM             Index       RIGHT ATRIUM           Index LA diam:        4.00 cm 2.16 cm/m  RA Area:     11.80 cm LA Vol (A2C):   51.5 ml 27.85 ml/m RA Volume:   24.30 ml  13.14 ml/m LA Vol (A4C):   65.7 ml 35.52  ml/m LA Biplane Vol: 58.1 ml 31.41 ml/m  AORTIC VALVE LVOT Vmax:   103.00 cm/s LVOT Vmean:  68.600 cm/s LVOT VTI:    0.209 m AI PHT:      325 msec  AORTA Ao Root diam: 3.10 cm Ao Asc diam:  3.20 cm MITRAL VALVE MV Area (PHT): 2.87 cm     SHUNTS MV Decel Time: 264 msec     Systemic VTI:  0.21 m MV E velocity: 76.40 cm/s   Systemic Diam: 1.80 cm MV A velocity: 123.00 cm/s MV E/A ratio:  0.62 Ena Dawley MD Electronically signed by Ena Dawley MD Signature Date/Time: 06/06/2020/1:52:05 PM    Final     Cardiac Studies   Echo:   1. When  compared to the prior study from 09/13/2018 LVEF has decreased  55-60% to 25-30% with diffuse hypokinesis.   2. Left ventricular ejection fraction, by estimation, is 25 to 30%. The  left ventricle has severely decreased function. The left ventricle  demonstrates global hypokinesis. There is severe concentric left  ventricular hypertrophy. Left ventricular  diastolic parameters are consistent with Grade I diastolic dysfunction  (impaired relaxation). Elevated left ventricular end-diastolic pressure.   3. Right ventricular systolic function is normal. The right ventricular  size is normal.   4. Left atrial size was mildly dilated.   5. The pericardial effusion is posterior to the left ventricle.   6. The mitral valve is normal in structure. Mild mitral valve  regurgitation. No evidence of mitral stenosis.   7. The aortic valve is normal in structure. There is moderate  calcification of the aortic valve. There is moderate thickening of the  aortic valve. Aortic valve regurgitation is mild. Mild to moderate aortic  valve sclerosis/calcification is present,  without any evidence of aortic stenosis.   8. The inferior vena cava is normal in size with greater than 50%  respiratory variability, suggesting right atrial pressure of 3 mmHg.    Patient Profile     [name] is a 65 y.o. female with a hx of poorly controlled DM, HFrecEF, unrevascularized  CAD, COPD, CKD, HTN and PVD who is being seen today for the evaluation of elevated troponin, now with reduced EF.    Assessment & Plan   Active Problems:   NSTEMI (non-ST elevated myocardial infarction) ([place])   She has elevated troponins and now has newly reduced ejection fraction which she has had previously.  May be demand ischemia in the setting of metabolic derangements, however with her history of on revascularized CAD, would consider coronary angiography early next week.  Would likely be best to optimize her DKA first, and then consider coronary angiography early next week.  She is significantly debilitated and more so after COVID-19 infection, but states that if coronary angiography is indicated she would agree to proceed.  She was consented and agreed as noted below.  Continue IV heparin, aspirin 81 mg daily, Lipitor 40 mg daily. -Resume Coreg 12.5 mg twice daily for hypertension and reduced EF.  -Consider resuming amlodipine today - renal function is impaired, but patient was on losartan at home, if remains stable and no plans for angiography, add back losartan particularly for hypertension.  INFORMED CONSENT: I have reviewed the risks, indications, and alternatives to cardiac catheterization, possible angioplasty, and stenting with the patient. Risks include but are not limited to bleeding, infection, vascular injury, stroke, myocardial infection, arrhythmia, kidney injury, radiation-related injury in the case of prolonged fluoroscopy use, emergency cardiac surgery, and death. The patient understands the risks of serious complication is 1-2 in [id number] with diagnostic cardiac cath and 1-2% or less with angioplasty/stenting.        For questions or updates, please contact [place] Please consult [email or web address] for contact info under        Signed, Elouise Munroe, MD  06/07/2020, 8:30 AM

## 2020-06-07 NOTE — Progress Notes (Signed)
HD#1 Subjective:  Overnight Events: No acute events reported.    Patient reports feeling better this morning. Denies any chest pain. She does endorse hunger.   Discussed medical management for her NSTEMI.   Objective:  Vital signs in last 24 hours: Vitals:   06/07/20 0300 06/07/20 0330 06/07/20 0415 06/07/20 0430  BP: (!) 134/57 (!) 144/56 (!) 156/61 (!) 148/65  Pulse: 80 79 80 81  Resp: (!) 23 (!) 29 12 (!) 25  Temp:      TempSrc:      SpO2: 100% 98% 99% 95%  Weight:      Height:       Supplemental O2: Room Air SpO2: 95 %   Physical Exam:  Blood pressure 133/80, pulse 88, temperature 98.7 F (37.1 C), temperature source Oral, resp. rate 16, height '5\' 3"'$  (1.6 m), weight 81.7 kg, SpO2 96 %.  Constitutional: Patient laying in bed appears comfortable, in no acute distress Respiratory: Lungs clear to ascultation, no rales or rhonchi, no increased work of breathing Cardiovascular: Regular rate and rhythm, no murmurs, no JVD, no lower extremity edema Abdomen: Soft, non tender, normal BS Skin: warm, dry  Filed Weights   06/06/20 0200 06/06/20 0240  Weight: 81.7 kg 81.7 kg    No intake or output data in the 24 hours ending 06/07/20 0544 Net IO Since Admission: 1,884 mL [06/07/20 0544]  Pertinent Labs: CBC Latest Ref Rng & Units 06/07/2020 06/06/2020 06/06/2020  WBC 4.0 - 10.5 K/uL 6.6 - -  Hemoglobin 12.0 - 15.0 g/dL 11.3(L) 14.3 14.6  Hematocrit 36.0 - 46.0 % 36.1 42.0 43.0  Platelets 150 - 400 K/uL 194 - -    CMP Latest Ref Rng & Units 06/07/2020 06/06/2020 06/06/2020  Glucose 70 - 99 mg/dL 98 116(H) 205(H)  BUN 8 - 23 mg/dL 20 23 25(H)  Creatinine 0.44 - 1.00 mg/dL 1.80(H) 1.89(H) 2.11(H)  Sodium 135 - 145 mmol/L 140 138 138  Potassium 3.5 - 5.1 mmol/L 3.4(L) 3.5 3.5  Chloride 98 - 111 mmol/L 111 110 109  CO2 22 - 32 mmol/L 17(L) 17(L) 17(L)  Calcium 8.9 - 10.3 mg/dL 8.2(L) 8.0(L) 7.8(L)  Total Protein 6.5 - 8.1 g/dL - - -  Total Bilirubin 0.3 - 1.2 mg/dL - - -   Alkaline Phos 38 - 126 U/L - - -  AST 15 - 41 U/L - - -  ALT 0 - 44 U/L - - -    Imaging: ECHOCARDIOGRAM COMPLETE  Result Date: 06/06/2020 IMPRESSIONS   1. When compared to the prior study from 09/13/2018 LVEF has decreased 55-60% to 25-30% with diffuse hypokinesis.   2. Left ventricular ejection fraction, by estimation, is 25 to 30%. The left ventricle has severely decreased function. The left ventricle demonstrates global hypokinesis. There is severe concentric left ventricular hypertrophy. Left ventricular diastolic parameters are consistent with Grade I diastolic dysfunction (impaired relaxation). Elevated left ventricular end-diastolic pressure.   3. Right ventricular systolic function is normal. The right ventricular size is normal.   4. Left atrial size was mildly dilated.   5. The pericardial effusion is posterior to the left ventricle.   6. The mitral valve is normal in structure. Mild mitral valve regurgitation. No evidence of mitral stenosis.   7. The aortic valve is normal in structure. There is moderate calcification of the aortic valve. There is moderate thickening of the aortic valve. Aortic valve regurgitation is mild. Mild to moderate aortic valve sclerosis/calcification is present, without any evidence of  aortic stenosis.   8. The inferior vena cava is normal in size with greater than 50% respiratory variability, suggesting right atrial pressure of 3 mmHg.   Assessment/Plan:   Active Problems:   NSTEMI (non-ST elevated myocardial infarction) [facility])   Patient Summary: [name] is a 65 y/o female with history of type II DM, HFrEF, CAD, COPD, HTN, CKD IIIb and PVD who presents with one week of intermittent chest pain.  # NSTEMI  # HFrEF Chest pain now resolved no SOB but has not tried walking Patient has history of DM with multi-vessel obstructive CAD. Appears euvolemic today. Likely NSTEMI in the setting of DKA and hypotension. Echo now with reduced ED of 25-30% with  pericardial effusion. Cardiology recommending angiography once DKA improves.  - Cardiology consulted, appreciate their recommendations - Continue heparin - ASA '81mg'$  daily - Lipitor '40mg'$  daily - Restart Coreg 12.5 twice daily and amlodipine 5 mg daily - Hold losartan until for possible angiography  - NTG and Zofran PRN - strict I/Os - Continuous telemetry and pulse ox   # Hypotension-resolved Likely due to dehydration in the setting of poor PO intake and DKA, likely 2/2 NSTEMI. BP now hypertensive to140s. Appears euvolemic today has received about 4L IV fluids since admission. Cultures no growth to date. - Orthostatics - Monitor vitals  # DKA  # non insulin dependent type 2 diabetes  Presented with DKA in the setting of taking Jardiance. Patient feeling hungry today and would like to eat. Appears euvolemic today. Insulin gtt held overnight for several hours without sq insulin with increase betahydroxybuterate. Anion gap remains closed, bicarb of 19 likely remains low in setting of NS and chloride excess.  CBGs in 120s. - Lantus 10 units daily - SSI moderate - Stop IV fluids - A1c - CBG monitoring  # AKI on CKD Creatinine 2.48>2.11>1.8. Baseline creatinine ~1.7. Likely prerenal in the setting of NSTEMI and decreased PO intake. Near baseline. -Monitor BMP - Hold home losartan  # HLD - Continue home atorvastatin '40mg'$  daily   # GERD - Continue Protonix '40mg'$  daily  Code Status:Full code  Diet:Carb modified, heart healthy [id number]  DVT PPx: On heparin infusion  Dispo: Anticipated discharge to Home in 2 days pending medical mangement.   Iona Beard, MD 06/07/2020, 5:44 AM Pager: [phone]  Please contact the on call pager after 5 pm and on weekends at [phone].

## 2020-06-07 NOTE — ED Notes (Addendum)
Upon arrival to shift, insulin at 0 mL/hour and D5 NS stopped by previous Therapist, sports. Subcutaneous insulin not administered by previous shift prior to stopping insulin drip. This RN spoke with Dr. Myrtie Hawk who gave verbal order to restart endotool insulin drip and D5 NS at 125 mL/hr. Patient's CBG 113, endotool advises 0 units/hour. MD updated. Dr. Myrtie Hawk gave verbal order to restart insulin drip at 1 unit/hour and recheck CBG in 1 hour.

## 2020-06-07 NOTE — Evaluation (Addendum)
Physical Therapy Evaluation Patient Details Name: [name] MRN: [id number] DOB: 1955-06-12 Today's Date: 06/07/2020   History of Present Illness  Pt is a 65 y.o. female who presents with mild diabetic ketoacidosis with progressive weakness over past week with confusion. She was also found to have chest pain and elevated troponin, likely a type II NSTEMI in the setting of hypotension and SKA with known ischemic heart disease. PMH: DM2, HFrecEF, CAD, COPD, HTN, CKD IIIb, MI, arthritis, and PVD.  Clinical Impression  Pt presents with condition above and deficits mentioned below, see PT Problem List. PTA, she was independent with all functional mobility with use of an AD/AE. Her husband's health is poor recently from having COVID and he can only help minimally. Her children who live nearby work but can assist intermittently when not at work. Pt reports having 6-7 falls in the last year as a result from legs buckling under her or inability to detect loss of balance until it was too late. This is probably secondary to her impaired sensation and dynamic proprioception throughout her bil legs. She also appears to have muscle endurance and strength deficits, resulting in knees buckling 1x during short gait bout today. Pt was able to perform all functional mobility with supervision-min guard assist and extra time. She appears to be not far off from her baseline, but she is at high risk for recurrent falls. Therefore, recommending pt continue with acute PT services and follow-up with outpatient PT at d/c to address her deficits, maximize her independence and safety with all functional mobility, and decrease her risk for falls and injuries.    Follow Up Recommendations Outpatient PT;Supervision for mobility/OOB    Equipment Recommendations  Other (comment) (shower chair)    Recommendations for Other Services       Precautions / Restrictions Precautions Precautions: Fall Precaution Comments: monitor  vitals Restrictions Weight Bearing Restrictions: No      Mobility  Bed Mobility Overal bed mobility: Needs Assistance Bed Mobility: Supine to Sit;Sit to Supine     Supine to sit: Supervision Sit to supine: Supervision   General bed mobility comments: Extra time and effort for supine <> sit in flat bed without rails. Supervision for safety.    Transfers Overall transfer level: Needs assistance Equipment used: Rolling walker (2 wheeled) Transfers: Sit to/from Stand Sit to Stand: Min guard         General transfer comment: Extra time and effort to power up to stand from EOB to RW, needing cues for hand placement prior to transfer to stand and return to sit as she tends to hold onto RW instead of the bed. Min guard for safety, mild unsteadiness but no LOB.  Ambulation/Gait Ambulation/Gait assistance: Min guard Gait Distance (Feet): 20 Feet Assistive device: Rolling walker (2 wheeled) Gait Pattern/deviations: Step-through pattern;Decreased step length - right;Decreased step length - left;Decreased stride length;Decreased weight shift to right;Decreased weight shift to left;Trunk flexed Gait velocity: reduced Gait velocity interpretation: <1.31 ft/sec, indicative of household ambulator General Gait Details: Ambulates with poor weight shifting and increased unsteadiness and knee instability with distance. One moment of knees buckling but pt able to catch self with RW and min guard.  Stairs            Wheelchair Mobility    Modified Rankin (Stroke Patients Only)       Balance Overall balance assessment: Needs assistance Sitting-balance support: No upper extremity supported Sitting balance-Leahy Scale: Good Sitting balance - Comments: Able to reach off BOS  to donn socks sitting EOB, supervision for safety.   Standing balance support: Bilateral upper extremity supported Standing balance-Leahy Scale: Poor Standing balance comment: Reliant on UE support on RW.                              Pertinent Vitals/Pain Pain Assessment: No/denies pain    Home Living Family/patient expects to be discharged to:: Private residence Living Arrangements: Spouse/significant other (older and has "not been himself with his strength and his memory is somewhat bad") Available Help at Discharge: Family;Available 24 hours/day (limited due to husband's poor health recently since having COVID; children work but can help some when not working) Type of Home: Apartment ([place]) Home Access: Stairs to enter Entrance Stairs-Rails: None Entrance Stairs-Number of Steps: 3-4 Home Layout: One [place]: Grab bars - toilet;Grab bars - tub/shower;Walker - 2 wheels;Cane - single point;Wheelchair - manual;Bedside commode (she states the grab bars in shower are high and she needs more) Additional Comments: Pt reports 6-7 falls in past year due to moments of legs buckling or starting to fall and not realizing it early enough to catch herself.    Prior Function Level of Independence: Independent with assistive device(s)         Comments: Utilizes RW, cane, and w/c for mobility based on distance she has to ambulate. Pt drives but has not drove since she had COVID. Daughter can drive pt when she is not working.     Hand Dominance   Dominant Hand: Left    Extremity/Trunk Assessment   Upper Extremity Assessment Upper Extremity Assessment: Defer to OT evaluation    Lower Extremity Assessment Lower Extremity Assessment: RLE deficits/detail;LLE deficits/detail RLE Deficits / Details: MMT scores of the following: hip flexion 4-, knee extension 4+, knee flexion 4+, ankle dorsiflexion 5 RLE Sensation: decreased light touch;decreased proprioception (numbness/tingling throughout entire leg; 3/5 correct with delayed dynamic proprioception throughout leg) RLE Coordination: decreased gross motor (no dysdiadochokinesia or dysmetria noted) LLE Deficits / Details: MMT scores  of the following: hip flexion 4-, knee extension 4+, knee flexion 4+, ankle dorsiflexion 5 LLE Sensation: decreased proprioception;decreased light touch (numbness/tingling throughout entire leg; 3/5 correct with delayed dynamic proprioception throughout leg) LLE Coordination: decreased gross motor (no dysdiadochokinesia or dysmetria noted)    Cervical / Trunk Assessment Cervical / Trunk Assessment: Kyphotic  Communication   Communication: No difficulties  Cognition Arousal/Alertness: Awake/alert Behavior During Therapy: WFL for tasks assessed/performed Overall Cognitive Status: Within Functional Limits for tasks assessed                                 General Comments: A&Ox4.      General Comments General comments (skin integrity, edema, etc.): HR 90s-105 bpm during session    Exercises     Assessment/Plan    PT Assessment Patient needs continued PT services  PT Problem List Decreased strength;Decreased range of motion;Decreased activity tolerance;Decreased balance;Decreased mobility;Decreased coordination;Decreased knowledge of use of DME;Decreased safety awareness;Cardiopulmonary status limiting activity;Impaired sensation       PT Treatment Interventions DME instruction;Gait training;Stair training;Functional mobility training;Therapeutic activities;Therapeutic exercise;Balance training;Neuromuscular re-education;Patient/family education    PT Goals (Current goals can be found in the Care Plan section)  Acute Rehab PT Goals Patient Stated Goal: to improve her balance to stop falling PT Goal Formulation: With patient Time For Goal Achievement: 06/21/20 Potential to Achieve Goals: Good  Frequency Min 3X/week   Barriers to discharge        Co-evaluation               AM-PAC PT "6 Clicks" Mobility  Outcome Measure Help needed turning from your back to your side while in a flat bed without using bedrails?: A Little Help needed moving from lying  on your back to sitting on the side of a flat bed without using bedrails?: A Little Help needed moving to and from a bed to a chair (including a wheelchair)?: A Little Help needed standing up from a chair using your arms (e.g., wheelchair or bedside chair)?: A Little Help needed to walk in hospital room?: A Little Help needed climbing 3-5 steps with a railing? : A Lot 6 Click Score: 17    End of Session Equipment Utilized During Treatment: Gait belt Activity Tolerance: Patient tolerated treatment well Patient left: in bed;with call bell/phone within reach;with bed alarm set   PT Visit Diagnosis: Unsteadiness on feet (R26.81);Other abnormalities of gait and mobility (R26.89);Muscle weakness (generalized) (M62.81);History of falling (Z91.81);Difficulty in walking, not elsewhere classified (R26.2)    Time: [id number] PT Time Calculation (min) (ACUTE ONLY): 41 min   Charges:   PT Evaluation $PT Eval Moderate Complexity: 1 Mod PT Treatments $Therapeutic Activity: 23-37 mins        Moishe Spice, PT, DPT Acute Rehabilitation Services  Pager: [phone] Office: [phone]   Orvan Falconer 06/07/2020, 6:04 PM

## 2020-06-08 ENCOUNTER — Ambulatory Visit: Payer: Medicaid Other | Admitting: Physical Therapy

## 2020-06-08 DIAGNOSIS — R739 Hyperglycemia, unspecified: Secondary | ICD-10-CM | POA: Diagnosis not present

## 2020-06-08 DIAGNOSIS — E1310 Other specified diabetes mellitus with ketoacidosis without coma: Secondary | ICD-10-CM | POA: Diagnosis not present

## 2020-06-08 DIAGNOSIS — K2100 Gastro-esophageal reflux disease with esophagitis, without bleeding: Secondary | ICD-10-CM | POA: Diagnosis not present

## 2020-06-08 DIAGNOSIS — N1831 Chronic kidney disease, stage 3a: Secondary | ICD-10-CM | POA: Diagnosis not present

## 2020-06-08 DIAGNOSIS — E1122 Type 2 diabetes mellitus with diabetic chronic kidney disease: Secondary | ICD-10-CM | POA: Diagnosis not present

## 2020-06-08 DIAGNOSIS — E785 Hyperlipidemia, unspecified: Secondary | ICD-10-CM | POA: Diagnosis not present

## 2020-06-08 DIAGNOSIS — I214 Non-ST elevation (NSTEMI) myocardial infarction: Secondary | ICD-10-CM | POA: Diagnosis not present

## 2020-06-08 DIAGNOSIS — I5042 Chronic combined systolic (congestive) and diastolic (congestive) heart failure: Secondary | ICD-10-CM | POA: Diagnosis not present

## 2020-06-08 DIAGNOSIS — N178 Other acute kidney failure: Secondary | ICD-10-CM | POA: Diagnosis not present

## 2020-06-08 LAB — PROTIME-INR
INR: 1 (ref 0.8–1.2)
Prothrombin Time: 12.7 seconds (ref 11.4–15.2)

## 2020-06-08 LAB — CBC
HCT: 34.9 % — ABNORMAL LOW (ref 36.0–46.0)
Hemoglobin: 11.2 g/dL — ABNORMAL LOW (ref 12.0–15.0)
MCH: 28.9 pg (ref 26.0–34.0)
MCHC: 32.1 g/dL (ref 30.0–36.0)
MCV: 89.9 fL (ref 80.0–100.0)
Platelets: 162 10*3/uL (ref 150–400)
RBC: 3.88 MIL/uL (ref 3.87–5.11)
RDW: 15.2 % (ref 11.5–15.5)
WBC: 6.8 10*3/uL (ref 4.0–10.5)
nRBC: 0 % (ref 0.0–0.2)

## 2020-06-08 LAB — BASIC METABOLIC PANEL
Anion gap: 8 (ref 5–15)
BUN: 15 mg/dL (ref 8–23)
CO2: 19 mmol/L — ABNORMAL LOW (ref 22–32)
Calcium: 8.2 mg/dL — ABNORMAL LOW (ref 8.9–10.3)
Chloride: 108 mmol/L (ref 98–111)
Creatinine, Ser: 1.91 mg/dL — ABNORMAL HIGH (ref 0.44–1.00)
GFR, Estimated: 29 mL/min — ABNORMAL LOW (ref >60)
Glucose, Bld: 342 mg/dL — ABNORMAL HIGH (ref 70–99)
Potassium: 3.8 mmol/L (ref 3.5–5.1)
Sodium: 135 mmol/L (ref 135–145)

## 2020-06-08 LAB — GLUCOSE, CAPILLARY
Glucose-Capillary: 174 mg/dL — ABNORMAL HIGH (ref 70–99)
Glucose-Capillary: 141 mg/dL — ABNORMAL HIGH (ref 70–99)
Glucose-Capillary: 148 mg/dL — ABNORMAL HIGH (ref 70–99)
Glucose-Capillary: 247 mg/dL — ABNORMAL HIGH (ref 70–99)
Glucose-Capillary: 234 mg/dL — ABNORMAL HIGH (ref 70–99)
Glucose-Capillary: 224 mg/dL — ABNORMAL HIGH (ref 70–99)
Glucose-Capillary: 286 mg/dL — ABNORMAL HIGH (ref 70–99)

## 2020-06-08 LAB — HEPARIN LEVEL (UNFRACTIONATED): Heparin Unfractionated: 0.49 IU/mL (ref 0.30–0.70)

## 2020-06-08 MED ORDER — INSULIN GLARGINE 100 UNIT/ML ~~LOC~~ SOLN: 15.0000 [IU] | Freq: Every day | SUBCUTANEOUS | Status: DC

## 2020-06-08 MED ORDER — SODIUM CHLORIDE 0.9% FLUSH
3.0000 mL | Freq: Two times a day (BID) | INTRAVENOUS | Status: DC
  Administered 2020-06-08 – 2020-06-10 (×4): 3 mL via INTRAVENOUS

## 2020-06-08 MED ORDER — SODIUM CHLORIDE 0.9 % IV SOLN: 250.0000 mL | INTRAVENOUS | Status: DC | PRN

## 2020-06-08 MED ORDER — INSULIN GLARGINE 100 UNIT/ML ~~LOC~~ SOLN
5.0000 [IU] | Freq: Every day | SUBCUTANEOUS | Status: DC
  Filled 2020-06-08: qty 0.05

## 2020-06-08 MED ORDER — INSULIN GLARGINE 100 UNIT/ML ~~LOC~~ SOLN: 10.0000 [IU] | Freq: Every day | SUBCUTANEOUS | Status: DC

## 2020-06-08 MED ORDER — INSULIN GLARGINE 100 UNIT/ML ~~LOC~~ SOLN
15.0000 [IU] | Freq: Every day | SUBCUTANEOUS | Status: DC
  Administered 2020-06-09: 15 [IU] via SUBCUTANEOUS
  Filled 2020-06-08 (×2): qty 0.15

## 2020-06-08 MED ORDER — INSULIN GLARGINE 100 UNIT/ML ~~LOC~~ SOLN
5.0000 [IU] | Freq: Every day | SUBCUTANEOUS | Status: AC
  Administered 2020-06-08: 5 [IU] via SUBCUTANEOUS
  Filled 2020-06-08: qty 0.05

## 2020-06-08 MED ORDER — SODIUM CHLORIDE 0.9 % IV SOLN: INTRAVENOUS | Status: DC

## 2020-06-08 MED ORDER — SODIUM CHLORIDE 0.9% FLUSH: 3.0000 mL | INTRAVENOUS | Status: DC | PRN

## 2020-06-08 MED ORDER — ASPIRIN 81 MG PO CHEW
81.0000 mg | CHEWABLE_TABLET | ORAL | Status: AC
  Administered 2020-06-09: 81 mg via ORAL
  Filled 2020-06-08: qty 1

## 2020-06-08 MED ORDER — INSULIN GLARGINE 100 UNIT/ML ~~LOC~~ SOLN
15.0000 [IU] | Freq: Every day | SUBCUTANEOUS | Status: DC
  Filled 2020-06-08: qty 0.15

## 2020-06-08 NOTE — Progress Notes (Signed)
HD#2 Subjective:  Overnight Events: No acute events reported.   Patient reports feeling ok. Denies dizziness with walking, Able to work with PT yesterday. Patient was able to eat ok. Has not had a BM since last Thursay. Denies abdominal pain.   She states that she was not taking diabetic medication due to feeling sick from Covid. Patient would like to feel better and less short of breath with exertion.   Plan to do angiogram before discharge.   Objective:  Vital signs in last 24 hours: Vitals:   06/07/20 1432 06/07/20 2032 06/08/20 0048 06/08/20 0400  BP: (!) 159/95 132/69 136/71 (!) 158/67  Pulse: 93 85 79 78  Resp: '19 18 19 19  '$ Temp:  99.2 F (37.3 C) 99.6 F (37.6 C)   TempSrc:  Oral Oral Oral  SpO2: 100% 100% 97% 98%  Weight: 76.4 kg  77.2 kg   Height: '5\' 3"'$  (1.6 m)      Supplemental O2: Room Air SpO2: 98 %   Physical Exam:  Blood pressure 133/80, pulse 88, temperature 98.7 F (37.1 C), temperature source Oral, resp. rate 16, height '5\' 3"'$  (1.6 m), weight 81.7 kg, SpO2 96 %.  Constitutional: Patient laying in bed appears comfortable, in no acute distress Respiratory: Lungs clear to ascultation, no rales or rhonchi, no increased work of breathing Cardiovascular: Regular rate and rhythm, no murmurs, no JVD, no lower extremity edema Abdomen: Soft, non tender, normal BS Skin: warm, dry  Filed Weights   06/06/20 0240 06/07/20 1432 06/08/20 0048  Weight: 81.7 kg 76.4 kg 77.2 kg     Intake/Output Summary (Last 24 hours) at 06/08/2020 0547 Last data filed at 06/08/2020 0450 Gross per 24 hour  Intake 941.69 ml  Output 850 ml  Net 91.69 ml   Net IO Since Admission: 1,975.69 mL [06/08/20 0547]  Pertinent Labs: CBC Latest Ref Rng & Units 06/08/2020 06/07/2020 06/06/2020  WBC 4.0 - 10.5 K/uL 6.8 6.6 -  Hemoglobin 12.0 - 15.0 g/dL 11.2(L) 11.3(L) 14.3  Hematocrit 36.0 - 46.0 % 34.9(L) 36.1 42.0  Platelets 150 - 400 K/uL 162 194 -    CMP Latest Ref Rng & Units 06/08/2020  06/07/2020 06/07/2020  Glucose 70 - 99 mg/dL 342(H) 117(H) 111(H)  BUN 8 - 23 mg/dL '15 16 18  '$ Creatinine 0.44 - 1.00 mg/dL 1.91(H) 1.80(H) 1.82(H)  Sodium 135 - 145 mmol/L 135 139 141  Potassium 3.5 - 5.1 mmol/L 3.8 3.7 3.9  Chloride 98 - 111 mmol/L 108 111 110  CO2 22 - 32 mmol/L 19(L) 19(L) 18(L)  Calcium 8.9 - 10.3 mg/dL 8.2(L) 8.2(L) 8.6(L)  Total Protein 6.5 - 8.1 g/dL - - -  Total Bilirubin 0.3 - 1.2 mg/dL - - -  Alkaline Phos 38 - 126 U/L - - -  AST 15 - 41 U/L - - -  ALT 0 - 44 U/L - - -   Imaging: ECHOCARDIOGRAM COMPLETE  Result Date: 06/06/2020 IMPRESSIONS   1. When compared to the prior study from 09/13/2018 LVEF has decreased 55-60% to 25-30% with diffuse hypokinesis.   2. Left ventricular ejection fraction, by estimation, is 25 to 30%. The left ventricle has severely decreased function. The left ventricle demonstrates global hypokinesis. There is severe concentric left ventricular hypertrophy. Left ventricular diastolic parameters are consistent with Grade I diastolic dysfunction (impaired relaxation). Elevated left ventricular end-diastolic pressure.   3. Right ventricular systolic function is normal. The right ventricular size is normal.   4. Left atrial size was  mildly dilated.   5. The pericardial effusion is posterior to the left ventricle.   6. The mitral valve is normal in structure. Mild mitral valve regurgitation. No evidence of mitral stenosis.   7. The aortic valve is normal in structure. There is moderate calcification of the aortic valve. There is moderate thickening of the aortic valve. Aortic valve regurgitation is mild. Mild to moderate aortic valve sclerosis/calcification is present, without any evidence of aortic stenosis.   8. The inferior vena cava is normal in size with greater than 50% respiratory variability, suggesting right atrial pressure of 3 mmHg.   Assessment/Plan:   Active Problems:   NSTEMI (non-ST elevated myocardial infarction) [facility])   Patient  Summary: Ms. Winick is a 65 y/o female with history of type II DM, HFrEF, CAD, COPD, HTN, CKD IIIb and PVD who presents with one week of intermittent chest pain.  # NSTEMI  # HFrEF Breathing feeling better, still some weakness with walking with PT yesterday. Chest pain and SOB likely NSTEMI in the setting of DKA and hypotension. However echo now with reduced ED of 25-30% with pericardial effusion. Will need coronary angiography to evaluate. - Cardiology consulted, appreciate their recommendations - ASA '81mg'$  daily - Lipitor '40mg'$  daily, Coreg 12.5 twice daily, and amlodipine 5 mg daily - Hold losartan until angiography  - NTG and Zofran PRN - strict I/Os - Continuous telemetry and pulse ox   # DKA  # non insulin dependent type 2 diabetes  Presented with DKA not taking medication prior to admission due to feeling unwell after COVID. CBG this morning in 242. Will need to stop her Jardiance given DKA. Will plan to discharge her on home trulicity, restart metformin, and Lantus.  - Increase to Lantus 15 units daily - SSI moderate - A1c - CBG monitoring  # AKI on CKD Creatinine 1.9 today. Baseline creatinine ~1.7. Likely prerenal in the setting of NSTEMI and decreased PO intake. Near baseline. -Monitor BMP - Hold home losartan  # HLD - Continue home atorvastatin '40mg'$  daily   # GERD - Continue Protonix '40mg'$  daily  Code Status:Full code  Diet:Carb modified, heart healthy [id number]  DVT PPx: On heparin infusion  Dispo: Anticipated discharge to Home in 1 days pending medical mangement.   Iona Beard, MD 06/08/2020, 5:47 AM Pager: [phone]  Please contact the on call pager after 5 pm and on weekends at [phone].

## 2020-06-08 NOTE — Progress Notes (Signed)
Patient would like to have covid vaccine, but says she has received conflicting advice from different people. She would like to have vaccine during hospital admission.

## 2020-06-08 NOTE — Progress Notes (Addendum)
Inpatient Diabetes Program Recommendations  AACE/ADA: New Consensus Statement on Inpatient Glycemic Control (2015)  Target Ranges:  Prepandial:   less than 140 mg/dL      Peak postprandial:   less than 180 mg/dL (1-2 hours)      Critically ill patients:  140 - 180 mg/dL   Lab Results  Component Value Date   GLUCAP 234 (H) 06/08/2020   HGBA1C 8.9 (A) 02/17/2020    Review of Glycemic Control Results for [name], [name] (MRN [id number]) as of 06/08/2020 13:15  Ref. Range 06/06/2020 23:05 06/07/2020 04:15 06/07/2020 06:08 06/07/2020 07:27 06/07/2020 12:00 06/07/2020 16:21 06/07/2020 21:15  Glucose-Capillary Latest Ref Range: 70 - 99 mg/dL 89 113 (H) 130 (H) 138 (H) 121 (H) 146 (H) 264 (H)   Diabetes history: DM 2 Outpatient Diabetes medications:  Trulicity [id number] mg weekly, Jardiance 25 mg daily, Metformin XR 2000 mg daily Current orders for Inpatient glycemic control:  Novolog moderate tid with meals Lantus 5 units q HS  Inpatient Diabetes Program Recommendations:     Will need to titrate insulin up based on fasting blood sugars.   A1C pending. Called and spoke with patient by phone.  She states that she has taken insulin in the past but it was stopped and changed to Victoza daily and then changed too Trulicity weekly.  We discussed that Trulicity is not the same as insulin.  She states she did not check her blood sugars when she got COVID b/c she felt so bad.  Discussed importance of checking CBG's at least 3 times a day.  Also discussed normal blood sugars and what to do if blood sugars too low. Told her that she will likely be restarted on insulin at d/c. She knows how to use both insulin pen and vial and syringe.  Patient appreciative of information. She did ask about speaking to Education officer, museum.  Note that [facility] consult in place.   Thanks,  Adah Perl, RN, BC-ADM Inpatient Diabetes Coordinator Pager [phone] (8a-5p)

## 2020-06-08 NOTE — Progress Notes (Signed)
R/LHC requested by Dr. Irish Lack - he rounded on her this AM and discussed procedure with pt. Will tenatively put on for 9am with Dr. Tamala Julian tomorrow. Orders written per our discussion to include low dose cath fluids. PharmD will assist in changing insulin to half dose tonight since she'll be NPO after midnight then resume regular dosing thereafter.

## 2020-06-08 NOTE — Progress Notes (Signed)
Physical Therapy Treatment Patient Details Name: [name] MRN: [id number] DOB: April 20, 1956 Today's Date: 06/08/2020    History of Present Illness Pt is a 65 y.o. female who presents with mild diabetic ketoacidosis with progressive weakness over past week with confusion. She was also found to have chest pain and elevated troponin, likely a type II NSTEMI in the setting of hypotension and SKA with known ischemic heart disease. PMH: DM2, HFrecEF, CAD, COPD, HTN, CKD IIIb, MI, arthritis, and PVD.    PT Comments    Pt was up to stand and walk, with low endurance due to ongoing issues of fluid overload and being relatively intolerant of standing.  Her limited tolerance for gait is reflective of discomfort and breathing, but is getting better at thinking about safety and ease of planning to reduce fall risk.  Distances walking continue to beshort, but will have help at home to succeed.  See for acute PT goals.   Follow Up Recommendations  Outpatient PT;Supervision for mobility/OOB     Equipment Recommendations  Other (comment)    Recommendations for Other Services       Precautions / Restrictions Precautions Precautions: Fall Precaution Comments: check O2 Restrictions Weight Bearing Restrictions: No    Mobility  Bed Mobility Overal bed mobility: Needs Assistance Bed Mobility: Supine to Sit;Sit to Supine     Supine to sit: Supervision Sit to supine: Supervision   General bed mobility comments: increased time  Transfers Overall transfer level: Needs assistance Equipment used: Rolling walker (2 wheeled) Transfers: Sit to/from Stand Sit to Stand: Min guard         General transfer comment: cued pt to think about her setup to stand  Ambulation/Gait Ambulation/Gait assistance: Min guard Gait Distance (Feet): 28 Feet Assistive device: Rolling walker (2 wheeled) Gait Pattern/deviations: Step-through pattern;Decreased step length - right;Decreased step length -  left;Decreased stride length;Decreased weight shift to right;Decreased weight shift to left;Trunk flexed Gait velocity: reduced Gait velocity interpretation: <1.31 ft/sec, indicative of household ambulator General Gait Details: no outright LOB and knees were stable   Stairs             Wheelchair Mobility    Modified Rankin (Stroke Patients Only)       Balance Overall balance assessment: Needs assistance Sitting-balance support: Feet supported Sitting balance-Leahy Scale: Good     Standing balance support: Bilateral upper extremity supported;Single extremity supported Standing balance-Leahy Scale: Poor Standing balance comment: using RW and chair arms for support to get to stand and maintan it                            Cognition Arousal/Alertness: Awake/alert Behavior During Therapy: WFL for tasks assessed/performed Overall Cognitive Status: Within Functional Limits for tasks assessed                                        Exercises      General Comments General comments (skin integrity, edema, etc.): HR in 90's      Pertinent Vitals/Pain Pain Assessment: No/denies pain    Home Living                      Prior Function            PT Goals (current goals can now be found in the care plan section) Acute Rehab PT  Goals Patient Stated Goal: get home and get fluid off her system Progress towards PT goals: Progressing toward goals    Frequency    Min 3X/week      PT Plan Current plan remains appropriate    Co-evaluation              AM-PAC PT "6 Clicks" Mobility   Outcome Measure  Help needed turning from your back to your side while in a flat bed without using bedrails?: A Little Help needed moving from lying on your back to sitting on the side of a flat bed without using bedrails?: A Little Help needed moving to and from a bed to a chair (including a wheelchair)?: A Little Help needed standing up  from a chair using your arms (e.g., wheelchair or bedside chair)?: A Little Help needed to walk in hospital room?: A Little Help needed climbing 3-5 steps with a railing? : A Lot 6 Click Score: 17    End of Session Equipment Utilized During Treatment: Gait belt Activity Tolerance: Patient tolerated treatment well Patient left: with call bell/phone within reach;in chair;with chair alarm set Nurse Communication: Mobility status PT Visit Diagnosis: Unsteadiness on feet (R26.81);Other abnormalities of gait and mobility (R26.89);Muscle weakness (generalized) (M62.81);History of falling (Z91.81);Difficulty in walking, not elsewhere classified (R26.2)     Time: [id number] PT Time Calculation (min) (ACUTE ONLY): 22 min  Charges:  $Gait Training: 8-22 mins                  Ramond Dial 06/08/2020, 9:06 PM Mee Hives, PT MS Acute Rehab Dept. Number: [place]

## 2020-06-08 NOTE — Plan of Care (Signed)
  Problem: Clinical Measurements: Goal: Cardiovascular complication will be avoided Outcome: Progressing   

## 2020-06-08 NOTE — Evaluation (Signed)
Occupational Therapy Evaluation Patient Details Name: [name] MRN: [id number] DOB: 1955-12-20 Today's Date: 06/08/2020    History of Present Illness Pt is a 65 y.o. female who presents with mild diabetic ketoacidosis with progressive weakness over past week with confusion. She was also found to have chest pain and elevated troponin, likely a type II NSTEMI in the setting of hypotension and SKA with known ischemic heart disease. PMH: DM2, HFrecEF, CAD, COPD, HTN, CKD IIIb, MI, arthritis, and PVD.   Clinical Impression   Pt PTA: Pt living with spouse and was his main caregiver since he developed covid. Pt currently, Pt limited by weakness in BLEs and decreased activity tolerance. Pt requiring increased time for tasks. Pt modA overall for ADL and minA for short distances with mobility and standing at sink x3 mins for light grooming. Pt would benefit from continued OT skilled services for ADL, mobility and energy conservation. OT following acutely.      Follow Up Recommendations  Home health OT;Supervision/Assistance - 24 hour    Equipment Recommendations  3 in 1 bedside commode    Recommendations for Other Services       Precautions / Restrictions Precautions Precautions: Fall Precaution Comments: monitor vitals Restrictions Weight Bearing Restrictions: No      Mobility Bed Mobility Overal bed mobility: Needs Assistance Bed Mobility: Supine to Sit;Sit to Supine     Supine to sit: Supervision Sit to supine: Supervision   General bed mobility comments: increased time    Transfers Overall transfer level: Needs assistance Equipment used: Rolling walker (2 wheeled) Transfers: Sit to/from Stand Sit to Stand: Min guard         General transfer comment: cues to use bed to push from    Balance Overall balance assessment: Needs assistance Sitting-balance support: No upper extremity supported Sitting balance-Leahy Scale: Good Sitting balance - Comments: Able to  reach off BOS to donn socks sitting EOB, supervision for safety.   Standing balance support: Bilateral upper extremity supported;Single extremity supported Standing balance-Leahy Scale: Poor Standing balance comment: Reliant on UE support on RW or counter for ADL                           ADL either performed or assessed with clinical judgement   ADL Overall ADL's : Needs assistance/impaired Eating/Feeding: Set up;Sitting   Grooming: Min guard;Standing   Upper Body Bathing: Minimal assistance;Standing   Lower Body Bathing: Moderate assistance;Sitting/lateral leans;Sit to/from stand   Upper Body Dressing : Minimal assistance;Standing   Lower Body Dressing: Moderate assistance;Sitting/lateral leans;Sit to/from stand;Cueing for safety   Toilet Transfer: Minimal assistance;Ambulation;Cueing for safety;RW   Toileting- Clothing Manipulation and Hygiene: Moderate assistance;+2 for physical assistance;Cueing for safety;Cueing for sequencing;Sitting/lateral lean;Sit to/from stand       Functional mobility during ADLs: Minimal assistance;Cueing for safety;Rolling walker General ADL Comments: Pt limited by weakness in BLEs and decreased energy conservation.     Vision Baseline Vision/History: No visual deficits Patient Visual Report: No change from baseline Vision Assessment?: No apparent visual deficits     Perception     Praxis      Pertinent Vitals/Pain Pain Assessment: No/denies pain     Hand Dominance Left   Extremity/Trunk Assessment     Lower Extremity Assessment Lower Extremity Assessment: Generalized weakness RLE Deficits / Details: decreased sensation LLE Sensation: decreased light touch   Cervical / Trunk Assessment Cervical / Trunk Assessment: Kyphotic   Communication Communication Communication: No difficulties  Cognition Arousal/Alertness: Awake/alert Behavior During Therapy: WFL for tasks assessed/performed Overall Cognitive Status:  Within Functional Limits for tasks assessed                                 General Comments: A&Ox4.   General Comments  HR 90s-105 bpm during session    Exercises     Shoulder Instructions      Home Living Family/patient expects to be discharged to:: Private residence Living Arrangements: Spouse/significant other (older and has "not been himself with his strength and his memory is somewhat bad") Available Help at Discharge: Family;Available 24 hours/day (limited due to husband's poor health recently since having COVID; children work but can help some when not working) Type of Home: Apartment ([place]) Home Access: Stairs to enter Technical brewer of Steps: 3-4 Entrance Stairs-Rails: None Home Layout: One level     Bathroom Shower/Tub: Teacher, early years/pre: [place]: Grab bars - toilet;Grab bars - tub/shower;Walker - 2 wheels;Cane - single point;Wheelchair - manual;Bedside commode (she states the grab bars in shower are high and she needs more)   Additional Comments: Pt reports 6-7 falls in past year due to moments of legs buckling or starting to fall and not realizing it early enough to catch herself.      Prior Functioning/Environment Level of Independence: Independent with assistive device(s)        Comments: Utilizes RW, cane, and w/c for mobility based on distance she has to ambulate. Pt drives but has not drove since she had COVID. Daughter can drive pt when she is not working.        OT Problem List: Decreased strength;Decreased activity tolerance;Impaired balance (sitting and/or standing);Decreased cognition;Decreased safety awareness;Cardiopulmonary status limiting activity;Pain      OT Treatment/Interventions: Self-care/ADL training;Therapeutic exercise;Energy conservation;Therapeutic activities;Patient/family education;Balance training    OT Goals(Current goals can be found in the care plan section) Acute  Rehab OT Goals Patient Stated Goal: to improve her balance to stop falling OT Goal Formulation: With patient Time For Goal Achievement: 06/22/20 Potential to Achieve Goals: Good ADL Goals Pt Will Perform Tub/Shower Transfer: with supervision;Shower transfer;ambulating Additional ADL Goal #1: Pt will perform x10 mins of OOB ADL with 1 seated rest break in order to increase activity tolerance. Additional ADL Goal #2: Pt will utilize and state 3 energy conservation strategies in order to increase independence with ADL and mobility.  OT Frequency: Min 2X/week   Barriers to D/C: Decreased caregiver support  spouse is very weak; children live closeby, but work       Co-evaluation              AM-PAC OT "6 Clicks" Daily Activity     Outcome Measure Help from another person eating meals?: None Help from another person taking care of personal grooming?: A Little Help from another person toileting, which includes using toliet, bedpan, or urinal?: A Lot Help from another person bathing (including washing, rinsing, drying)?: A Little Help from another person to put on and taking off regular upper body clothing?: A Little Help from another person to put on and taking off regular lower body clothing?: A Lot 6 Click Score: 17   End of Session Nurse Communication: Mobility status  Activity Tolerance: Patient tolerated treatment well;Patient limited by lethargy Patient left: in bed;with call bell/phone within reach  OT Visit Diagnosis: Unsteadiness on feet (R26.81);Muscle weakness (generalized) (M62.81);Pain  Time: [id number] OT Time Calculation (min): 33 min Charges:  OT General Charges $OT Visit: 1 Visit OT Evaluation $OT Eval Moderate Complexity: 1 Mod OT Treatments $Self Care/Home Management : 8-22 mins  Jefferey Pica, OTR/L Acute Rehabilitation Services Pager: [phone] Office: [place] 06/08/2020, 6:49 PM

## 2020-06-08 NOTE — Progress Notes (Signed)
[facility] for Heparin Indication: chest pain/ACS  Allergies  Allergen Reactions  . Lisinopril Cough  . Plavix [Clopidogrel Bisulfate] Rash    Patient Measurements: Height: '5\' 3"'$  (160 cm) Weight: 77.2 kg (170 lb 3.1 oz) IBW/kg (Calculated) : 52.4 Heparin Dosing Weight: 69 kg  Vital Signs: Temp: 98.8 F (37.1 C) (02/07 0400) Temp Source: Oral (02/07 0400) BP: 158/67 (02/07 0400) Pulse Rate: 78 (02/07 0400)  Labs: Recent Labs    06/06/20 0227 06/06/20 0258 06/06/20 0259 06/06/20 0455 06/06/20 1140 06/06/20 1141 06/06/20 1738 06/07/20 0346 06/07/20 0809 06/08/20 0228  HGB 12.6   < > 14.3  --   --   --   --  11.3*  --  11.2*  HCT 40.0   < > 42.0  --   --   --   --  36.1  --  34.9*  PLT 209  --   --   --   --   --   --  194  --  162  HEPARINUNFRC  --   --   --   --   --  0.50  --  0.35  --  0.49  CREATININE 2.48*   < >  --   --    < >  --    < > 1.82* 1.80* 1.91*  TROPONINIHS 2,410*  --   --  2,238*  --   --   --   --   --   --    < > = values in this interval not displayed.    Estimated Creatinine Clearance: 29.7 mL/min (A) (by C-G formula based on SCr of 1.91 mg/dL (H)).   Medical History: Past Medical History:  Diagnosis Date  . Anemia 07/17/2013  . Arthritis    "hands, shoulders, hips, legs" (05/10/2018)  . Arthritis of left hip 08/18/2013   S/p total hip arthroplasty.    . CHF (congestive heart failure) ([place])    2014...@ [facility]  . Chronic lower back pain   . CKD (chronic kidney disease) stage 3, GFR 30-59 ml/min (HCC)   . COPD (chronic obstructive pulmonary disease) ([place])   . Healthcare maintenance 06/16/2015  . High cholesterol   . Hypertension   . Influenza A 06/01/2017  . Influenza, pneumonia 06/01/2017  . Left flank pain 10/11/2016  . Myocardial infarction [facility])    "been told that I've had one; don't know when it would have been" (05/10/2018)  . Problem with sexual relationship 10/13/2017  . PVD (peripheral vascular  disease) ([place])   . Shortness of breath 03/12/2019  . Type II diabetes mellitus ([place])    diagnosed 2001     Assessment: 65 y.o. F presents with CP on heparin for NSTEMI No AC PTA. CBC ok at baseline. Plans noted for cath this week -heparin level at goal   Goal of Therapy:  Heparin level 0.3-0.7 units/ml Monitor platelets by anticoagulation protocol: Yes   Plan:  Continue heparin [id number] units/hr Daily heparin level, CBC, s/s bleeding  Hildred Laser, PharmD Clinical Pharmacist **Pharmacist phone directory can now be found on [email or web address] ([email or web address]).  Listed under [place].

## 2020-06-08 NOTE — Progress Notes (Signed)
Progress Note  Patient Name: [name] Date of Encounter: 06/08/2020  [facility] Cardiologist: Sinclair Grooms, MD   Subjective   No chest pain.  Appetite decreased  Inpatient Medications    Scheduled Meds: . amLODipine  10 mg Oral Daily  . aspirin EC  81 mg Oral Daily  . atorvastatin  40 mg Oral q1800  . carvedilol  12.5 mg Oral BID WC  . influenza vac split quadrivalent PF  0.5 mL Intramuscular Tomorrow-1000  . insulin aspart  0-15 Units Subcutaneous TID WC  . insulin glargine  10 Units Subcutaneous QHS  . pantoprazole  40 mg Oral Daily   Continuous Infusions: . heparin 900 Units/hr (06/08/20 0603)   PRN Meds: acetaminophen, albuterol, dextrose, nitroGLYCERIN, ondansetron (ZOFRAN) IV, polyethylene glycol, senna   Vital Signs    Vitals:   06/07/20 1432 06/07/20 2032 06/08/20 0048 06/08/20 0400  BP: (!) 159/95 132/69 136/71 (!) 158/67  Pulse: 93 85 79 78  Resp: '19 18 19 19  '$ Temp:  99.2 F (37.3 C) 99.6 F (37.6 C) 98.8 F (37.1 C)  TempSrc:  Oral Oral Oral  SpO2: 100% 100% 97% 98%  Weight: 76.4 kg  77.2 kg   Height: '5\' 3"'$  (1.6 m)       Intake/Output Summary (Last 24 hours) at 06/08/2020 1138 Last data filed at 06/08/2020 0837 Gross per 24 hour  Intake 1061.69 ml  Output 850 ml  Net 211.69 ml   Last 3 Weights 06/08/2020 06/07/2020 06/06/2020  Weight (lbs) 170 lb 3.1 oz 168 lb 6.9 oz 180 lb 1.9 oz  Weight (kg) 77.2 kg 76.4 kg 81.7 kg      Telemetry     NSR- Personally Reviewed  ECG    NSR LBBB- Personally Reviewed  Physical Exam   GEN: No acute distress.   Neck: No JVD Cardiac: RRR, no murmurs, rubs, or gallops.  Respiratory: Clear to auscultation bilaterally. GI: Soft, nontender, non-distended  MS: No edema; No deformity. Neuro:  Nonfocal  Psych: Normal affect   Labs    High Sensitivity Troponin:   Recent Labs  Lab 06/06/20 0227 06/06/20 0455  TROPONINIHS 2,410* 2,238*      Chemistry Recent Labs  Lab 06/06/20 0227  06/06/20 0258 06/07/20 0346 06/07/20 0809 06/08/20 0228  NA 128*   < > 141 139 135  K 4.7   < > 3.9 3.7 3.8  CL 96*   < > 110 111 108  CO2 18*   < > 18* 19* 19*  GLUCOSE 661*   < > 111* 117* 342*  BUN 32*   < > '18 16 15  '$ CREATININE 2.48*   < > 1.82* 1.80* 1.91*  CALCIUM 8.3*   < > 8.6* 8.2* 8.2*  PROT 6.2*  --   --   --   --   ALBUMIN 2.7*  --   --   --   --   AST 19  --   --   --   --   ALT 14  --   --   --   --   ALKPHOS 80  --   --   --   --   BILITOT 1.2  --   --   --   --   GFRNONAA 21*   < > 31* 31* 29*  ANIONGAP 14   < > '13 9 8   '$ < > = values in this interval not displayed.     Hematology  Recent Labs  Lab 06/06/20 0227 06/06/20 0258 06/06/20 0259 06/07/20 0346 06/08/20 0228  WBC 6.0  --   --  6.6 6.8  RBC 4.44  --   --  3.95 3.88  HGB 12.6   < > 14.3 11.3* 11.2*  HCT 40.0   < > 42.0 36.1 34.9*  MCV 90.1  --   --  91.4 89.9  MCH 28.4  --   --  28.6 28.9  MCHC 31.5  --   --  31.3 32.1  RDW 14.4  --   --  15.0 15.2  PLT 209  --   --  194 162   < > = values in this interval not displayed.    BNP Recent Labs  Lab 06/06/20 0228  BNP 953.9*     DDimer No results for input(s): DDIMER in the last 168 hours.   Radiology    ECHOCARDIOGRAM COMPLETE  Result Date: 06/06/2020    ECHOCARDIOGRAM REPORT   Patient Name:   [name] Date of Exam: 06/06/2020 Medical Rec #:  [id number]      Height:       63.0 in Accession #:    [id number]     Weight:       180.1 lb Date of Birth:  1955/08/28     BSA:          1.849 m Patient Age:    19 years       BP:           128/68 mmHg Patient Gender: F              HR:           81 bpm. Exam Location:  Inpatient Procedure: 2D Echo, Cardiac Doppler and Color Doppler Indications:    NSTEMI I21.4  History:        Patient has prior history of Echocardiogram examinations, most                 recent 09/13/2018. CHF, Previous Myocardial Infarction, COPD,                 Signs/Symptoms:Dyspnea; Risk Factors:Hypertension, Diabetes and                  Dyslipidemia. COVID-19. CKD.  Sonographer:    Jonelle Sidle Dance Referring Phys: [place]  1. When compared to the prior study from 09/13/2018 LVEF has decreased 55-60% to 25-30% with diffuse hypokinesis.  2. Left ventricular ejection fraction, by estimation, is 25 to 30%. The left ventricle has severely decreased function. The left ventricle demonstrates global hypokinesis. There is severe concentric left ventricular hypertrophy. Left ventricular diastolic parameters are consistent with Grade I diastolic dysfunction (impaired relaxation). Elevated left ventricular end-diastolic pressure.  3. Right ventricular systolic function is normal. The right ventricular size is normal.  4. Left atrial size was mildly dilated.  5. The pericardial effusion is posterior to the left ventricle.  6. The mitral valve is normal in structure. Mild mitral valve regurgitation. No evidence of mitral stenosis.  7. The aortic valve is normal in structure. There is moderate calcification of the aortic valve. There is moderate thickening of the aortic valve. Aortic valve regurgitation is mild. Mild to moderate aortic valve sclerosis/calcification is present, without any evidence of aortic stenosis.  8. The inferior vena cava is normal in size with greater than 50% respiratory variability, suggesting right atrial pressure of 3 mmHg. FINDINGS  Left Ventricle: Left ventricular  ejection fraction, by estimation, is 25 to 30%. The left ventricle has severely decreased function. The left ventricle demonstrates global hypokinesis. The left ventricular internal cavity size was normal in size. There is severe concentric left ventricular hypertrophy. Left ventricular diastolic parameters are consistent with Grade I diastolic dysfunction (impaired relaxation). Elevated left ventricular end-diastolic pressure. Right Ventricle: The right ventricular size is normal. No increase in right ventricular wall thickness. Right  ventricular systolic function is normal. Left Atrium: Left atrial size was mildly dilated. Right Atrium: Right atrial size was normal in size. Pericardium: Trivial pericardial effusion is present. The pericardial effusion is posterior to the left ventricle. Mitral Valve: The mitral valve is normal in structure. Mild mitral valve regurgitation. No evidence of mitral valve stenosis. Tricuspid Valve: The tricuspid valve is normal in structure. Tricuspid valve regurgitation is not demonstrated. No evidence of tricuspid stenosis. Aortic Valve: The aortic valve is normal in structure. There is moderate calcification of the aortic valve. There is moderate thickening of the aortic valve. Aortic valve regurgitation is mild. Aortic regurgitation PHT measures 325 msec. Mild to moderate  aortic valve sclerosis/calcification is present, without any evidence of aortic stenosis. Pulmonic Valve: The pulmonic valve was normal in structure. Pulmonic valve regurgitation is not visualized. No evidence of pulmonic stenosis. Aorta: The aortic root is normal in size and structure. Venous: The inferior vena cava is normal in size with greater than 50% respiratory variability, suggesting right atrial pressure of 3 mmHg. IAS/Shunts: No atrial level shunt detected by color flow Doppler.  LEFT VENTRICLE PLAX 2D LVIDd:         4.00 cm  Diastology LVIDs:         3.40 cm  LV e' medial:    4.03 cm/s LV PW:         1.60 cm  LV E/e' medial:  19.0 LV IVS:        1.50 cm  LV e' lateral:   4.47 cm/s LVOT diam:     1.80 cm  LV E/e' lateral: 17.1 LV SV:         53 LV SV Index:   29 LVOT Area:     2.54 cm  RIGHT VENTRICLE             IVC RV Basal diam:  2.30 cm     IVC diam: 2.20 cm RV S prime:     12.40 cm/s TAPSE (M-mode): 2.1 cm LEFT ATRIUM             Index       RIGHT ATRIUM           Index LA diam:        4.00 cm 2.16 cm/m  RA Area:     11.80 cm LA Vol (A2C):   51.5 ml 27.85 ml/m RA Volume:   24.30 ml  13.14 ml/m LA Vol (A4C):   65.7 ml 35.52  ml/m LA Biplane Vol: 58.1 ml 31.41 ml/m  AORTIC VALVE LVOT Vmax:   103.00 cm/s LVOT Vmean:  68.600 cm/s LVOT VTI:    0.209 m AI PHT:      325 msec  AORTA Ao Root diam: 3.10 cm Ao Asc diam:  3.20 cm MITRAL VALVE MV Area (PHT): 2.87 cm     SHUNTS MV Decel Time: 264 msec     Systemic VTI:  0.21 m MV E velocity: 76.40 cm/s   Systemic Diam: 1.80 cm MV A velocity: 123.00 cm/s MV E/A ratio:  0.62 [place] Siren  Meda Coffee MD Electronically signed by Ena Dawley MD Signature Date/Time: 06/06/2020/1:52:05 PM    Final     Cardiac Studies   Prior cath result reviewed  Patient Profile     66 y.o. female with NSTEMI in the setting of DKA.  Assessment & Plan    1) NSTEMI:  Needs cath.  Renal function at baseline.  Continue medical therapy.  LVEF decreased so need to see if CAD has worsened. Procedure described to patient and she is agreeable.  Cardiac catheterization was discussed with the patient fully. The patient understands that risks include but are not limited to stroke (1 in 1000), death (1 in 31), kidney failure [usually temporary] (1 in 500), bleeding (1 in 200), allergic reaction [possibly serious] (1 in 200).  The patient understands and is willing to proceed.   All questions answered.  No LV gram. Minimize contrast due to renal insufficiency which is at baseline.  May need to [facility] L[facility] if there is still moderate disease.   Check BMet in AM before procedure     For questions or updates, please contact [place] Please consult [email or web address] for contact info under        Signed, Larae Grooms, MD  06/08/2020, 11:38 AM

## 2020-06-08 NOTE — H&P (View-Only) (Signed)
Progress Note  Patient Name: [name] Date of Encounter: 06/08/2020  [facility] Cardiologist: Sinclair Grooms, MD   Subjective   No chest pain.  Appetite decreased  Inpatient Medications    Scheduled Meds: . amLODipine  10 mg Oral Daily  . aspirin EC  81 mg Oral Daily  . atorvastatin  40 mg Oral q1800  . carvedilol  12.5 mg Oral BID WC  . influenza vac split quadrivalent PF  0.5 mL Intramuscular Tomorrow-1000  . insulin aspart  0-15 Units Subcutaneous TID WC  . insulin glargine  10 Units Subcutaneous QHS  . pantoprazole  40 mg Oral Daily   Continuous Infusions: . heparin 900 Units/hr (06/08/20 0603)   PRN Meds: acetaminophen, albuterol, dextrose, nitroGLYCERIN, ondansetron (ZOFRAN) IV, polyethylene glycol, senna   Vital Signs    Vitals:   06/07/20 1432 06/07/20 2032 06/08/20 0048 06/08/20 0400  BP: (!) 159/95 132/69 136/71 (!) 158/67  Pulse: 93 85 79 78  Resp: '19 18 19 19  '$ Temp:  99.2 F (37.3 C) 99.6 F (37.6 C) 98.8 F (37.1 C)  TempSrc:  Oral Oral Oral  SpO2: 100% 100% 97% 98%  Weight: 76.4 kg  77.2 kg   Height: '5\' 3"'$  (1.6 m)       Intake/Output Summary (Last 24 hours) at 06/08/2020 1138 Last data filed at 06/08/2020 0837 Gross per 24 hour  Intake 1061.69 ml  Output 850 ml  Net 211.69 ml   Last 3 Weights 06/08/2020 06/07/2020 06/06/2020  Weight (lbs) 170 lb 3.1 oz 168 lb 6.9 oz 180 lb 1.9 oz  Weight (kg) 77.2 kg 76.4 kg 81.7 kg      Telemetry     NSR- Personally Reviewed  ECG    NSR LBBB- Personally Reviewed  Physical Exam   GEN: No acute distress.   Neck: No JVD Cardiac: RRR, no murmurs, rubs, or gallops.  Respiratory: Clear to auscultation bilaterally. GI: Soft, nontender, non-distended  MS: No edema; No deformity. Neuro:  Nonfocal  Psych: Normal affect   Labs    High Sensitivity Troponin:   Recent Labs  Lab 06/06/20 0227 06/06/20 0455  TROPONINIHS 2,410* 2,238*      Chemistry Recent Labs  Lab 06/06/20 0227  06/06/20 0258 06/07/20 0346 06/07/20 0809 06/08/20 0228  NA 128*   < > 141 139 135  K 4.7   < > 3.9 3.7 3.8  CL 96*   < > 110 111 108  CO2 18*   < > 18* 19* 19*  GLUCOSE 661*   < > 111* 117* 342*  BUN 32*   < > '18 16 15  '$ CREATININE 2.48*   < > 1.82* 1.80* 1.91*  CALCIUM 8.3*   < > 8.6* 8.2* 8.2*  PROT 6.2*  --   --   --   --   ALBUMIN 2.7*  --   --   --   --   AST 19  --   --   --   --   ALT 14  --   --   --   --   ALKPHOS 80  --   --   --   --   BILITOT 1.2  --   --   --   --   GFRNONAA 21*   < > 31* 31* 29*  ANIONGAP 14   < > '13 9 8   '$ < > = values in this interval not displayed.     Hematology  Recent Labs  Lab 06/06/20 0227 06/06/20 0258 06/06/20 0259 06/07/20 0346 06/08/20 0228  WBC 6.0  --   --  6.6 6.8  RBC 4.44  --   --  3.95 3.88  HGB 12.6   < > 14.3 11.3* 11.2*  HCT 40.0   < > 42.0 36.1 34.9*  MCV 90.1  --   --  91.4 89.9  MCH 28.4  --   --  28.6 28.9  MCHC 31.5  --   --  31.3 32.1  RDW 14.4  --   --  15.0 15.2  PLT 209  --   --  194 162   < > = values in this interval not displayed.    BNP Recent Labs  Lab 06/06/20 0228  BNP 953.9*     DDimer No results for input(s): DDIMER in the last 168 hours.   Radiology    ECHOCARDIOGRAM COMPLETE  Result Date: 06/06/2020    ECHOCARDIOGRAM REPORT   Patient Name:   [name] Date of Exam: 06/06/2020 Medical Rec #:  [id number]      Height:       63.0 in Accession #:    [id number]     Weight:       180.1 lb Date of Birth:  1956/02/22     BSA:          1.849 m Patient Age:    65 years       BP:           128/68 mmHg Patient Gender: F              HR:           81 bpm. Exam Location:  Inpatient Procedure: 2D Echo, Cardiac Doppler and Color Doppler Indications:    NSTEMI I21.4  History:        Patient has prior history of Echocardiogram examinations, most                 recent 09/13/2018. CHF, Previous Myocardial Infarction, COPD,                 Signs/Symptoms:Dyspnea; Risk Factors:Hypertension, Diabetes and                  Dyslipidemia. COVID-19. CKD.  Sonographer:    Jonelle Sidle Dance Referring Phys: [place]  1. When compared to the prior study from 09/13/2018 LVEF has decreased 55-60% to 25-30% with diffuse hypokinesis.  2. Left ventricular ejection fraction, by estimation, is 25 to 30%. The left ventricle has severely decreased function. The left ventricle demonstrates global hypokinesis. There is severe concentric left ventricular hypertrophy. Left ventricular diastolic parameters are consistent with Grade I diastolic dysfunction (impaired relaxation). Elevated left ventricular end-diastolic pressure.  3. Right ventricular systolic function is normal. The right ventricular size is normal.  4. Left atrial size was mildly dilated.  5. The pericardial effusion is posterior to the left ventricle.  6. The mitral valve is normal in structure. Mild mitral valve regurgitation. No evidence of mitral stenosis.  7. The aortic valve is normal in structure. There is moderate calcification of the aortic valve. There is moderate thickening of the aortic valve. Aortic valve regurgitation is mild. Mild to moderate aortic valve sclerosis/calcification is present, without any evidence of aortic stenosis.  8. The inferior vena cava is normal in size with greater than 50% respiratory variability, suggesting right atrial pressure of 3 mmHg. FINDINGS  Left Ventricle: Left ventricular  ejection fraction, by estimation, is 25 to 30%. The left ventricle has severely decreased function. The left ventricle demonstrates global hypokinesis. The left ventricular internal cavity size was normal in size. There is severe concentric left ventricular hypertrophy. Left ventricular diastolic parameters are consistent with Grade I diastolic dysfunction (impaired relaxation). Elevated left ventricular end-diastolic pressure. Right Ventricle: The right ventricular size is normal. No increase in right ventricular wall thickness. Right  ventricular systolic function is normal. Left Atrium: Left atrial size was mildly dilated. Right Atrium: Right atrial size was normal in size. Pericardium: Trivial pericardial effusion is present. The pericardial effusion is posterior to the left ventricle. Mitral Valve: The mitral valve is normal in structure. Mild mitral valve regurgitation. No evidence of mitral valve stenosis. Tricuspid Valve: The tricuspid valve is normal in structure. Tricuspid valve regurgitation is not demonstrated. No evidence of tricuspid stenosis. Aortic Valve: The aortic valve is normal in structure. There is moderate calcification of the aortic valve. There is moderate thickening of the aortic valve. Aortic valve regurgitation is mild. Aortic regurgitation PHT measures 325 msec. Mild to moderate  aortic valve sclerosis/calcification is present, without any evidence of aortic stenosis. Pulmonic Valve: The pulmonic valve was normal in structure. Pulmonic valve regurgitation is not visualized. No evidence of pulmonic stenosis. Aorta: The aortic root is normal in size and structure. Venous: The inferior vena cava is normal in size with greater than 50% respiratory variability, suggesting right atrial pressure of 3 mmHg. IAS/Shunts: No atrial level shunt detected by color flow Doppler.  LEFT VENTRICLE PLAX 2D LVIDd:         4.00 cm  Diastology LVIDs:         3.40 cm  LV e' medial:    4.03 cm/s LV PW:         1.60 cm  LV E/e' medial:  19.0 LV IVS:        1.50 cm  LV e' lateral:   4.47 cm/s LVOT diam:     1.80 cm  LV E/e' lateral: 17.1 LV SV:         53 LV SV Index:   29 LVOT Area:     2.54 cm  RIGHT VENTRICLE             IVC RV Basal diam:  2.30 cm     IVC diam: 2.20 cm RV S prime:     12.40 cm/s TAPSE (M-mode): 2.1 cm LEFT ATRIUM             Index       RIGHT ATRIUM           Index LA diam:        4.00 cm 2.16 cm/m  RA Area:     11.80 cm LA Vol (A2C):   51.5 ml 27.85 ml/m RA Volume:   24.30 ml  13.14 ml/m LA Vol (A4C):   65.7 ml 35.52  ml/m LA Biplane Vol: 58.1 ml 31.41 ml/m  AORTIC VALVE LVOT Vmax:   103.00 cm/s LVOT Vmean:  68.600 cm/s LVOT VTI:    0.209 m AI PHT:      325 msec  AORTA Ao Root diam: 3.10 cm Ao Asc diam:  3.20 cm MITRAL VALVE MV Area (PHT): 2.87 cm     SHUNTS MV Decel Time: 264 msec     Systemic VTI:  0.21 m MV E velocity: 76.40 cm/s   Systemic Diam: 1.80 cm MV A velocity: 123.00 cm/s MV E/A ratio:  0.62 [place] Siren  Meda Coffee MD Electronically signed by Ena Dawley MD Signature Date/Time: 06/06/2020/1:52:05 PM    Final     Cardiac Studies   Prior cath result reviewed  Patient Profile     65 y.o. female with NSTEMI in the setting of DKA.  Assessment & Plan    1) NSTEMI:  Needs cath.  Renal function at baseline.  Continue medical therapy.  LVEF decreased so need to see if CAD has worsened. Procedure described to patient and she is agreeable.  Cardiac catheterization was discussed with the patient fully. The patient understands that risks include but are not limited to stroke (1 in 1000), death (1 in 70), kidney failure [usually temporary] (1 in 500), bleeding (1 in 200), allergic reaction [possibly serious] (1 in 200).  The patient understands and is willing to proceed.   All questions answered.  No LV gram. Minimize contrast due to renal insufficiency which is at baseline.  May need to [facility] if there is still moderate disease.   Check BMet in AM before procedure     For questions or updates, please contact [place] Please consult [email or web address] for contact info under        Signed, Larae Grooms, MD  06/08/2020, 11:38 AM

## 2020-06-09 ENCOUNTER — Encounter (HOSPITAL_COMMUNITY)
Admission: EM | Disposition: A | Discharge status: Home Health | Payer: Self-pay | Source: Home / Self Care | Attending: Internal Medicine

## 2020-06-09 ENCOUNTER — Encounter (HOSPITAL_COMMUNITY): Payer: Self-pay | Admitting: Interventional Cardiology

## 2020-06-09 DIAGNOSIS — K2100 Gastro-esophageal reflux disease with esophagitis, without bleeding: Secondary | ICD-10-CM | POA: Diagnosis not present

## 2020-06-09 DIAGNOSIS — N183 Chronic kidney disease, stage 3 unspecified: Secondary | ICD-10-CM | POA: Diagnosis not present

## 2020-06-09 DIAGNOSIS — I2510 Atherosclerotic heart disease of native coronary artery without angina pectoris: Secondary | ICD-10-CM | POA: Diagnosis not present

## 2020-06-09 DIAGNOSIS — R739 Hyperglycemia, unspecified: Secondary | ICD-10-CM

## 2020-06-09 DIAGNOSIS — E785 Hyperlipidemia, unspecified: Secondary | ICD-10-CM | POA: Diagnosis not present

## 2020-06-09 DIAGNOSIS — N178 Other acute kidney failure: Secondary | ICD-10-CM | POA: Diagnosis not present

## 2020-06-09 DIAGNOSIS — I5042 Chronic combined systolic (congestive) and diastolic (congestive) heart failure: Secondary | ICD-10-CM | POA: Diagnosis not present

## 2020-06-09 DIAGNOSIS — Z8616 Personal history of COVID-19: Secondary | ICD-10-CM | POA: Diagnosis not present

## 2020-06-09 DIAGNOSIS — N1831 Chronic kidney disease, stage 3a: Secondary | ICD-10-CM | POA: Diagnosis not present

## 2020-06-09 DIAGNOSIS — I214 Non-ST elevation (NSTEMI) myocardial infarction: Secondary | ICD-10-CM | POA: Diagnosis not present

## 2020-06-09 DIAGNOSIS — I5023 Acute on chronic systolic (congestive) heart failure: Secondary | ICD-10-CM

## 2020-06-09 DIAGNOSIS — E1122 Type 2 diabetes mellitus with diabetic chronic kidney disease: Secondary | ICD-10-CM | POA: Diagnosis not present

## 2020-06-09 DIAGNOSIS — I25110 Atherosclerotic heart disease of native coronary artery with unstable angina pectoris: Secondary | ICD-10-CM | POA: Diagnosis not present

## 2020-06-09 HISTORY — PX: RIGHT/LEFT HEART CATH AND CORONARY ANGIOGRAPHY: CATH118266

## 2020-06-09 LAB — POCT I-STAT 7, (LYTES, BLD GAS, ICA,H+H)
Acid-Base Excess: 0 mmol/L (ref 0.0–2.0)
Bicarbonate: 24.9 mmol/L (ref 20.0–28.0)
Calcium, Ion: 1.3 mmol/L (ref 1.15–1.40)
HCT: 33 % — ABNORMAL LOW (ref 36.0–46.0)
Hemoglobin: 11.2 g/dL — ABNORMAL LOW (ref 12.0–15.0)
O2 Saturation: 100 %
Potassium: 3.6 mmol/L (ref 3.5–5.1)
Sodium: 145 mmol/L (ref 135–145)
TCO2: 26 mmol/L (ref 22–32)
pCO2 arterial: 41.9 mmHg (ref 32.0–48.0)
pH, Arterial: 7.382 (ref 7.350–7.450)
pO2, Arterial: 176 mmHg — ABNORMAL HIGH (ref 83.0–108.0)

## 2020-06-09 LAB — POCT I-STAT EG7
Acid-Base Excess: 0 mmol/L (ref 0.0–2.0)
Acid-Base Excess: 0 mmol/L (ref 0.0–2.0)
Bicarbonate: 25.6 mmol/L (ref 20.0–28.0)
Bicarbonate: 25.8 mmol/L (ref 20.0–28.0)
Calcium, Ion: 1.29 mmol/L (ref 1.15–1.40)
Calcium, Ion: 1.3 mmol/L (ref 1.15–1.40)
HCT: 31 % — ABNORMAL LOW (ref 36.0–46.0)
HCT: 32 % — ABNORMAL LOW (ref 36.0–46.0)
Hemoglobin: 10.5 g/dL — ABNORMAL LOW (ref 12.0–15.0)
Hemoglobin: 10.9 g/dL — ABNORMAL LOW (ref 12.0–15.0)
O2 Saturation: 68 %
O2 Saturation: 68 %
Potassium: 3.5 mmol/L (ref 3.5–5.1)
Potassium: 3.5 mmol/L (ref 3.5–5.1)
Sodium: 144 mmol/L (ref 135–145)
Sodium: 144 mmol/L (ref 135–145)
TCO2: 27 mmol/L (ref 22–32)
TCO2: 27 mmol/L (ref 22–32)
pCO2, Ven: 44.7 mmHg (ref 44.0–60.0)
pCO2, Ven: 45.4 mmHg (ref 44.0–60.0)
pH, Ven: 7.363 (ref 7.250–7.430)
pH, Ven: 7.365 (ref 7.250–7.430)
pO2, Ven: 37 mmHg (ref 32.0–45.0)
pO2, Ven: 37 mmHg (ref 32.0–45.0)

## 2020-06-09 LAB — BASIC METABOLIC PANEL
Anion gap: 9 (ref 5–15)
Anion gap: 9 (ref 5–15)
BUN: 15 mg/dL (ref 8–23)
BUN: 12 mg/dL (ref 8–23)
CO2: 22 mmol/L (ref 22–32)
CO2: 20 mmol/L — ABNORMAL LOW (ref 22–32)
Calcium: 8.6 mg/dL — ABNORMAL LOW (ref 8.9–10.3)
Calcium: 8.3 mg/dL — ABNORMAL LOW (ref 8.9–10.3)
Chloride: 107 mmol/L (ref 98–111)
Chloride: 110 mmol/L (ref 98–111)
Creatinine, Ser: 1.76 mg/dL — ABNORMAL HIGH (ref 0.44–1.00)
Creatinine, Ser: 1.68 mg/dL — ABNORMAL HIGH (ref 0.44–1.00)
GFR, Estimated: 32 mL/min — ABNORMAL LOW (ref >60)
GFR, Estimated: 34 mL/min — ABNORMAL LOW (ref >60)
Glucose, Bld: 401 mg/dL — ABNORMAL HIGH (ref 70–99)
Glucose, Bld: 203 mg/dL — ABNORMAL HIGH (ref 70–99)
Potassium: 3.8 mmol/L (ref 3.5–5.1)
Potassium: 4 mmol/L (ref 3.5–5.1)
Sodium: 138 mmol/L (ref 135–145)
Sodium: 139 mmol/L (ref 135–145)

## 2020-06-09 LAB — LIPID PANEL
Cholesterol: 166 mg/dL (ref 0–200)
HDL: 41 mg/dL (ref >40)
LDL Cholesterol: 99 mg/dL (ref 0–99)
Total CHOL/HDL Ratio: 4 RATIO
Triglycerides: 128 mg/dL (ref <150)
VLDL: 26 mg/dL (ref 0–40)

## 2020-06-09 LAB — CBC
HCT: 33.6 % — ABNORMAL LOW (ref 36.0–46.0)
Hemoglobin: 11.1 g/dL — ABNORMAL LOW (ref 12.0–15.0)
MCH: 29.3 pg (ref 26.0–34.0)
MCHC: 33 g/dL (ref 30.0–36.0)
MCV: 88.7 fL (ref 80.0–100.0)
Platelets: 166 10*3/uL (ref 150–400)
RBC: 3.79 MIL/uL — ABNORMAL LOW (ref 3.87–5.11)
RDW: 15.4 % (ref 11.5–15.5)
WBC: 5.7 10*3/uL (ref 4.0–10.5)
nRBC: 0 % (ref 0.0–0.2)

## 2020-06-09 LAB — GLUCOSE, CAPILLARY
Glucose-Capillary: 296 mg/dL — ABNORMAL HIGH (ref 70–99)
Glucose-Capillary: 140 mg/dL — ABNORMAL HIGH (ref 70–99)
Glucose-Capillary: 173 mg/dL — ABNORMAL HIGH (ref 70–99)
Glucose-Capillary: 259 mg/dL — ABNORMAL HIGH (ref 70–99)

## 2020-06-09 LAB — HEPARIN LEVEL (UNFRACTIONATED): Heparin Unfractionated: 0.41 IU/mL (ref 0.30–0.70)

## 2020-06-09 SURGERY — RIGHT/LEFT HEART CATH AND CORONARY ANGIOGRAPHY
Anesthesia: LOCAL

## 2020-06-09 MED ORDER — HEPARIN (PORCINE) IN NACL 1000-0.9 UT/500ML-% IV SOLN
INTRAVENOUS | Status: DC | PRN
  Administered 2020-06-09 (×2): 500 mL

## 2020-06-09 MED ORDER — LIDOCAINE HCL (PF) 1 % IJ SOLN
INTRAMUSCULAR | Status: AC
  Filled 2020-06-09: qty 30

## 2020-06-09 MED ORDER — MIDAZOLAM HCL 2 MG/2ML IJ SOLN
INTRAMUSCULAR | Status: AC
  Filled 2020-06-09: qty 2

## 2020-06-09 MED ORDER — SODIUM CHLORIDE 0.9 % IV SOLN: INTRAVENOUS | Status: AC

## 2020-06-09 MED ORDER — NITROGLYCERIN 1 MG/10 ML FOR IR/CATH LAB
INTRA_ARTERIAL | Status: AC
  Filled 2020-06-09: qty 10

## 2020-06-09 MED ORDER — IOHEXOL 350 MG/ML SOLN
INTRAVENOUS | Status: DC | PRN
  Administered 2020-06-09: 50 mL

## 2020-06-09 MED ORDER — FENTANYL CITRATE (PF) 100 MCG/2ML IJ SOLN
INTRAMUSCULAR | Status: DC | PRN
  Administered 2020-06-09: 25 ug via INTRAVENOUS

## 2020-06-09 MED ORDER — NITROGLYCERIN 1 MG/10 ML FOR IR/CATH LAB
INTRA_ARTERIAL | Status: DC | PRN
  Administered 2020-06-09: 200 ug

## 2020-06-09 MED ORDER — LIDOCAINE HCL (PF) 1 % IJ SOLN
INTRAMUSCULAR | Status: DC | PRN
  Administered 2020-06-09 (×2): 2 mL

## 2020-06-09 MED ORDER — HEPARIN SODIUM (PORCINE) 1000 UNIT/ML IJ SOLN
INTRAMUSCULAR | Status: DC | PRN
  Administered 2020-06-09: 4000 [IU] via INTRAVENOUS

## 2020-06-09 MED ORDER — SODIUM CHLORIDE 0.9 % IV SOLN: 250.0000 mL | INTRAVENOUS | Status: DC | PRN

## 2020-06-09 MED ORDER — MIDAZOLAM HCL 2 MG/2ML IJ SOLN
INTRAMUSCULAR | Status: DC | PRN
  Administered 2020-06-09: 1 mg via INTRAVENOUS

## 2020-06-09 MED ORDER — HEPARIN SODIUM (PORCINE) 1000 UNIT/ML IJ SOLN
INTRAMUSCULAR | Status: AC
  Filled 2020-06-09: qty 1

## 2020-06-09 MED ORDER — HEPARIN (PORCINE) IN NACL 1000-0.9 UT/500ML-% IV SOLN
INTRAVENOUS | Status: AC
  Filled 2020-06-09: qty 500

## 2020-06-09 MED ORDER — HEPARIN (PORCINE) 25000 UT/250ML-% IV SOLN
900.0000 [IU]/h | INTRAVENOUS | Status: DC
  Administered 2020-06-09 – 2020-06-10 (×3): 900 [IU]/h via INTRAVENOUS
  Filled 2020-06-09 (×2): qty 250

## 2020-06-09 MED ORDER — VERAPAMIL HCL 2.5 MG/ML IV SOLN
INTRAVENOUS | Status: AC
  Filled 2020-06-09: qty 2

## 2020-06-09 MED ORDER — ACETAMINOPHEN 325 MG PO TABS: 650.0000 mg | ORAL_TABLET | ORAL | Status: DC | PRN

## 2020-06-09 MED ORDER — FENTANYL CITRATE (PF) 100 MCG/2ML IJ SOLN
INTRAMUSCULAR | Status: AC
  Filled 2020-06-09: qty 2

## 2020-06-09 MED ORDER — HYDRALAZINE HCL 20 MG/ML IJ SOLN: 10.0000 mg | INTRAMUSCULAR | Status: AC | PRN

## 2020-06-09 MED ORDER — VERAPAMIL HCL 2.5 MG/ML IV SOLN
INTRAVENOUS | Status: DC | PRN
  Administered 2020-06-09: 10 mL via INTRA_ARTERIAL

## 2020-06-09 MED ORDER — SODIUM CHLORIDE 0.9% FLUSH: 3.0000 mL | INTRAVENOUS | Status: DC | PRN

## 2020-06-09 SURGICAL SUPPLY — 14 items
CATH 5FR JL3.5 JR4 ANG PIG MP (CATHETERS) ×2 IMPLANT
CATH BALLN WEDGE 5F 110CM (CATHETERS) ×1 IMPLANT
COVER DOME SNAP 22 D (MISCELLANEOUS) ×1 IMPLANT
DEVICE RAD COMP TR BAND LRG (VASCULAR PRODUCTS) ×1 IMPLANT
GLIDESHEATH SLEND A-KIT 6F 22G (SHEATH) ×1 IMPLANT
GUIDEWIRE .025 260CM (WIRE) ×1 IMPLANT
GUIDEWIRE INQWIRE 1.5J.035X260 (WIRE) IMPLANT
INQWIRE 1.5J .035X260CM (WIRE) ×2
KIT HEART LEFT (KITS) ×2 IMPLANT
PACK CARDIAC CATHETERIZATION (CUSTOM PROCEDURE TRAY) ×2 IMPLANT
SHEATH GLIDE SLENDER 4/5FR (SHEATH) ×1 IMPLANT
SHEATH PROBE COVER 6X72 (BAG) ×1 IMPLANT
TRANSDUCER W/STOPCOCK (MISCELLANEOUS) ×2 IMPLANT
TUBING CIL FLEX 10 FLL-RA (TUBING) ×2 IMPLANT

## 2020-06-09 NOTE — Progress Notes (Signed)
PT Cancellation Note  Patient Details Name: [name] MRN: [id number] DOB: 1955-10-29   Cancelled Treatment:    Reason Eval/Treat Not Completed: Patient at procedure or test/unavailable (cath).  Wyona Almas, PT, DPT Acute Rehabilitation Services Pager [phone] Office [phone]    Deno Etienne 06/09/2020, 10:54 AM

## 2020-06-09 NOTE — Consult Note (Addendum)
[address]       [place] [address]             [place] Record #[id number] Date of Birth: [birth year]  Referring: Tamala Julian Primary Care: Marty Heck, DO Primary Cardiologist:Henry Nicholes Stairs III, MD  Chief Complaint:    Chief Complaint  Patient presents with   Diabetic Ketoacidosis   Hypotension  Recovering from Covid infection   History of Present Illness:      [name] is a 65 yo female with known history of recent COVID 65 pneumonia requiring hospitalization in December of 2021, Type 2 DM, CAD, COPD, HTN, CKD IIIb and PVD.  The patient presented to the ED on 06/06/2020 with complaints of elevated blood sugar.  Upon arrival to the ED she was noted to have an elevated blood sugar of 595.  She was also noted to be hypotensive on arrival.  Upon further questioning the patient also admitted to overall not feeling all that great since her COVID infection.  She has been experiencing shortness of breath since that time. Over the past week she has been feeling more fatigued and she admitted to not taking her Trulicity during that time.  The patient denied chest pain on arrival, but did state she has also been having chest pain over the past week which relieved with NTG.  In the ED she was treated with IV fluid boluses to help with hypotension.  She was started on an insulin drip.  EKG was obtained and showed no evidence of acute ischemic changes.  She did have a stable LBBB.  Initial Troponin level was obtained and was elevated at 2400.  She was placed on Heparin drip and given ASA.  CXR was obtained and did not show evidence of pneumonia or pulmonary edema.  She was admitted to the medicine service for further workup.  Cardiology consult was obtained and they ruled patient in for NSTEMI.  It was felt with her recent decline due to COVID infection and her elevated blood sugars that medical management be used as treatment.   Echocardiogram was ordered and showed significant reduction in her EF to 25-30%.  Cardiology followed the patient and recommended optimization of her glucose levels prior to proceeding with catheterization.  This was achieved over the next several days and she was taken to the catheterization lab on 06/09/2020.  She was found to have multivessel CAD.  Currently the patient is chest pain free.  She states she hasn't really recovered well from her COVID infection.  She remains weak and her shortness of breath persists.  She also has a cough from time to time.  She is a former smoker but quit over 10 years ago.  She mentions she has experienced chest pain off and on since her COVID infection.  The pain has radiated into her back and she has experienced occasional nausea and vomiting.  She states her diabetes is normally well controlled with sugars running less than 200.  She mentions she gets pain in her legs and in the past has had procedure on her legs to "clean out the veins" however she doesn't remember what they did or when this was.  She denies surgery on her legs.  she is not vaccinated against COVID 19.  Current Activity/ Functional Status: Patient is independent with mobility/ambulation, transfers, ADL's, IADL's.   Zubrod Score: At the time of  surgery this patient's most appropriate activity status/level should be described as: []    0    Normal activity, no symptoms []    1    Restricted in physical strenuous activity but ambulatory, able to do out light work []    2    Ambulatory and capable of self care, unable to do work activities, up and about                 more than 50%  Of the time                            [x]    3    Only limited self care, in bed greater than 50% of waking hours []    4    Completely disabled, no self care, confined to bed or chair []    5    Moribund  Past Medical History:  Diagnosis Date   Anemia 07/17/2013   Arthritis    "hands, shoulders, hips, legs" (05/10/2018)    Arthritis of left hip 08/18/2013   S/p total hip arthroplasty.     CHF (congestive heart failure) (HCC)    2014...@ [facility]   Chronic lower back pain    CKD (chronic kidney disease) stage 3, GFR 30-59 ml/min (HCC)    COPD (chronic obstructive pulmonary disease) (HCC)    Healthcare maintenance 06/16/2015   High cholesterol    Hypertension    Influenza A 06/01/2017   Influenza, pneumonia 06/01/2017   Left flank pain 10/11/2016   Myocardial infarction (HCC)    "been told that I've had one; don't know when it would have been" (05/10/2018)   Problem with sexual relationship 10/13/2017   PVD (peripheral vascular disease) (HCC)    Shortness of breath 03/12/2019   Type II diabetes mellitus (HCC)    diagnosed 2001    Past Surgical History:  Procedure Laterality Date   ABDOMINAL ANGIOGRAM  12/28/2012   ABDOMINAL ANGIOGRAM N/A 12/29/2011   Procedure: ABDOMINAL ANGIOGRAM;  Surgeon: Brian L Chen, MD;  Location: [facility] CATH LAB;  Service: Cardiovascular;  Laterality: N/A;   ABDOMINAL AORTAGRAM N/A 12/21/2012   Procedure: ABDOMINAL AORTAGRAM;  Surgeon: Charles E Fields, MD;  Location: [facility] CATH [facility];  Service: Cardiovascular;  Laterality: N/A;   EYE SURGERY Bilateral ~ 2018   "laser; related to my diabetes"   JOINT REPLACEMENT     PERIPHERAL VASCULAR CATHETERIZATION     "had to have some veins unclogged cause I was in pain when I walked" (05/10/2018)   RIGHT/LEFT HEART CATH AND CORONARY ANGIOGRAPHY N/A 04/30/2018   Procedure: RIGHT/LEFT HEART CATH AND CORONARY ANGIOGRAPHY;  Surgeon: Smith, Henry W, MD;  Location: [facility] INVASIVE CV LAB;  Service: Cardiovascular;  Laterality: N/A;   RIGHT/LEFT HEART CATH AND CORONARY ANGIOGRAPHY N/A 06/09/2020   Procedure: RIGHT/LEFT HEART CATH AND CORONARY ANGIOGRAPHY;  Surgeon: Smith, Henry W, MD;  Location: [facility] INVASIVE CV [facility];  Service: Cardiovascular;  Laterality: N/A;   TOTAL HIP ARTHROPLASTY Left 08/19/2013   Procedure: TOTAL HIP ARTHROPLASTY;  Surgeon: Frank J Rowan, MD;   Location: [facility] OR;  Service: Orthopedics;  Laterality: Left;   TUBAL LIGATION      Social History   Tobacco Use  Smoking Status Former Smoker   Years: 7.00   Types: Cigarettes   Quit date: 05/08/1987   Years since quitting: 33.1  Smokeless Tobacco Never Used  Tobacco Comment   05/10/2018 "smoked when I   drank"    Social History   Substance and Sexual Activity  Alcohol Use Not Currently   Alcohol/week: 0.0 standard drinks   Comment: 05/10/2018 "glass of wine on holidays"     Allergies  Allergen Reactions   Lisinopril Cough   Plavix [Clopidogrel Bisulfate] Rash    Current Facility-Administered Medications  Medication Dose Route Frequency Provider Last Rate Last Admin   0.9 %  sodium chloride infusion   Intravenous Continuous [name], MD 100 mL/hr at 06/09/20 1143 New Bag at 06/09/20 1143   0.9 %  sodium chloride infusion  250 mL Intravenous PRN [name], MD       acetaminophen (TYLENOL) tablet 650 mg  650 mg Oral Q4H PRN [name], MD       albuterol (VENTOLIN HFA) 108 (90 Base) MCG/ACT inhaler 2 puff  2 puff Inhalation Q6H PRN [name], MD       amLODipine (NORVASC) tablet 10 mg  10 mg Oral Daily [name], MD   10 mg at 06/09/20 0940   aspirin EC tablet 81 mg  81 mg Oral Daily [name], MD   81 mg at 06/09/20 0941   atorvastatin (LIPITOR) tablet 40 mg  40 mg Oral q1800 [name], MD   40 mg at 06/08/20 1734   carvedilol (COREG) tablet 12.5 mg  12.5 mg Oral BID WC [name], MD   12.5 mg at 06/09/20 0939   dextrose 50 % solution 0-50 mL  0-50 mL Intravenous PRN Cardama, Grayce Sessions, MD       heparin ADULT infusion 100 units/mL (25000 units/244m)  900 Units/hr Intravenous Continuous CWendee Beavers RPH 9 mL/hr at 06/09/20 1345 900 Units/hr at 06/09/20 1345   hydrALAZINE (APRESOLINE) injection 10 mg  10 mg Intravenous Q20 Min PRN SBelva Crome MD       influenza vac split quadrivalent PF (FLUARIX) injection 0.5 mL  0.5 mL Intramuscular  Tomorrow-1000 VAxel Filler MD       insulin aspart (novoLOG) injection 0-15 Units  0-15 Units Subcutaneous TID WC AHarvie Heck MD   2 Units at 06/09/20 1150   insulin glargine (LANTUS) injection 15 Units  15 Units Subcutaneous QHS Narendra, Nischal, MD       nitroGLYCERIN (NITROSTAT) SL tablet 0.4 mg  0.4 mg Sublingual Q5 Min x 3 PRN [place], Carley D, DO       ondansetron (ZOFRAN) injection 4 mg  4 mg Intravenous Q6H PRN [place], Carley D, DO       pantoprazole (PROTONIX) EC tablet 40 mg  40 mg Oral Daily [place], Carley D, DO   40 mg at 06/09/20 00814  polyethylene glycol (MIRALAX / GLYCOLAX) packet 17 g  17 g Oral Daily PRN ALacinda Axon MD   17 g at 06/08/20 2209   senna (SENOKOT) tablet 8.6 mg  1 tablet Oral Daily PRN VAxel Filler MD   8.6 mg at 06/08/20 2209   sodium chloride flush (NS) 0.9 % injection 3 mL  3 mL Intravenous Q12H Dunn, Dayna N, PA-C   3 mL at 06/08/20 2152   sodium chloride flush (NS) 0.9 % injection 3 mL  3 mL Intravenous PRN SBelva Crome MD        Medications Prior to Admission  Medication Sig Dispense Refill Last Dose   Accu-Chek FastClix Lancets MISC Check blood sugar up to  3x/day before meals 102 each 12  ACCU-CHEK GUIDE test strip CHECK BLOOD SUGAR UP TO 3X/DAY BEFORE MEALS 100 strip 11    acetaminophen (TYLENOL) 500 MG tablet Take 1,000 mg by mouth every 6 (six) hours as needed for mild pain, fever or headache.   unk   albuterol (PROVENTIL HFA) 108 (90 Base) MCG/ACT inhaler Inhale 2 puffs into the lungs every 6 (six) hours as needed for wheezing or shortness of breath. 18 g 0 unk   amLODipine (NORVASC) 5 MG tablet Take 1 tablet (5 mg total) by mouth daily. 90 tablet 3 06/05/2020 at Unknown time   aspirin EC 81 MG tablet Take 1 tablet (81 mg total) by mouth daily. IM program, [facility] 30 tablet 11 Past Month at Unknown time   atorvastatin (LIPITOR) 40 MG tablet TAKE 1 TABLET (40 MG TOTAL) BY MOUTH DAILY. 90 tablet 2 06/05/2020  at Unknown time   blood glucose meter kit and supplies KIT Dispense based on patient and insurance preference. Use up to four times daily as directed. (FOR ICD-9 250.00, 250.01). 1 each 0    blood glucose meter kit and supplies KIT Dispense based on patient and insurance preference. Use up to four times daily as directed. (FOR ICD-9 250.00, 250.01). 1 each 0    Blood Glucose Monitoring Suppl (ACCU-CHEK GUIDE) w/Device KIT 1 each by Does not apply route 3 (three) times daily. 1 kit 0    carvedilol (COREG) 12.5 MG tablet TAKE 1 TABLET (12.5 MG TOTAL) BY MOUTH 2 (TWO) TIMES DAILY WITH A MEAL. 60 tablet 2 06/05/2020 at 1700   Cholecalciferol (VITAMIN D3) 125 MCG (5000 UT) CAPS Take 5,000 Units by mouth daily.   06/05/2020 at Unknown time   Dulaglutide (TRULICITY) 0.75 MG/0.5ML SOPN Inject 0.75 mg into the skin once a week. 4 pen 5 06/02/2020   empagliflozin (JARDIANCE) 25 MG TABS tablet Take 1 tablet (25 mg total) by mouth daily. 60 tablet 2 06/05/2020 at Unknown time   furosemide (LASIX) 20 MG tablet Take 1 tablet (20 mg total) by mouth daily. (Patient taking differently: Take 20 mg by mouth daily as needed for fluid.) 90 tablet 3 unk   losartan (COZAAR) 100 MG tablet Take 1 tablet (100 mg total) by mouth daily. 30 tablet 3 06/05/2020 at Unknown time   metFORMIN (GLUCOPHAGE-XR) 500 MG 24 hr tablet Take 4 tablets (2,000 mg total) by mouth daily with breakfast. (Patient taking differently: Take 1,000 mg by mouth in the morning and at bedtime.) 120 tablet 5 06/05/2020 at Unknown time   Multiple Vitamins-Minerals (MULTIVITAMIN GUMMIES ADULT) CHEW Chew 2 tablets by mouth daily.   06/05/2020 at Unknown time   nitroGLYCERIN (NITROSTAT) 0.4 MG SL tablet Place 1 tablet (0.4 mg total) under the tongue every 5 (five) minutes as needed for chest pain. 75 tablet 2 Past Week at Unknown time   omeprazole (PRILOSEC) 20 MG capsule TAKE 1 CAPSULE (20 MG TOTAL) BY MOUTH DAILY. 30 capsule 2 06/05/2020 at Unknown time   tiZANidine (ZANAFLEX)  4 MG tablet Take 1 tablet (4 mg total) by mouth every 6 (six) hours as needed for muscle spasms. 30 tablet 0 unk   UNIFINE PENTIPS 31G X 5 MM MISC USE TO INJECT 20 UNITS DAILY 100 each 1     Family History  Problem Relation Age of Onset   Diabetes Mother    Review of Systems:      Cardiac Review of Systems: Y or  [    ]= no  Chest Pain [ Y   ]    Resting SOB [ Y  ] Exertional SOB  [Y  ]  Orthopnea [  ]   Pedal Edema [   ]    Palpitations [ N ] Syncope  [  ]   Presyncope [   ]  General Review of Systems: [Y] = yes [  ]=no Constitional: recent weight change [  ]; anorexia [  ]; fatigue [ Y ]; nausea [Y  ]; night sweats [  ]; fever [  ]; or chills [  ]                                                               Dental: Last Dentist visit:   Eye : blurred vision [  ]; diplopia [   ]; vision changes [  ];  Amaurosis fugax[  ]; Resp: cough [Y  ];  wheezing[  ];  hemoptysis[  ]; shortness of breath[ Y ]; paroxysmal nocturnal dyspnea[  ]; dyspnea on exertion[Y  ]; or orthopnea[  ];  GI:  gallstones[  ], vomiting[  ];  dysphagia[  ]; melena[  ];  hematochezia [  ]; heartburn[  ];   Hx of  Colonoscopy[  ]; GU: kidney stones [  ]; hematuria[  ];   dysuria [  ];  nocturia[  ];  history of     obstruction [  ]; urinary frequency [  ]             Skin: rash, swelling[N];, hair loss[  ];  peripheral edema[ N ];  or itching[  ]; Musculosketetal: myalgias[  ];  joint swelling[  ];  joint erythema[  ];  joint pain[  ];  back pain[Y  ];  Heme/Lymph: bruising[  ];  bleeding[  ];  anemia[  ];  Neuro: TIA[  ];  headaches[  ];  stroke[N  ];  vertigo[  ];  seizures[  ];   paresthesias[  ];  difficulty walking[Y, at times pain in the legs ];  Psych:depression[ N ]; anxiety[ N ];  Endocrine: diabetes[Y ];  thyroid dysfunction[  ];  Physical Exam: BP 131/78   Pulse 73   Temp 98.2 F (36.8 C) (Oral)   Resp 20   Ht 5' 3" (1.6 m)   Wt 77.9 kg   SpO2 97%   BMI 30.42 kg/m   General appearance: alert,  cooperative and no distress Neck: no adenopathy, no JVD, supple, symmetrical, trachea midline, thyroid not enlarged, symmetric, no tenderness/mass/nodules and + carotid bruit on the left Resp: clear to auscultation bilaterally Cardio: regular rate and rhythm GI: soft, non-tender; bowel sounds normal; no masses,  no organomegaly Extremities: extremities normal, atraumatic, no cyanosis or edema, no surgical incisions on her BLE Neurologic: Grossly normal  Diagnostic Studies & Laboratory data:     Recent Radiology Findings:   CARDIAC CATHETERIZATION  Result Date: 06/09/2020  Severe diffuse three-vessel coronary artery disease.  40 to 50% ostial LAD, 85% mid LAD.  Severe obstruction first obtuse marginal/ramus intermedius.  85% proximal to mid circumflex stenosis.  Severe diffuse disease in each of 3 obtuse marginal branches.  Total occlusion of the proximal to mid RCA.  LVEDP is normal.  Left ventriculography was not performed because of renal insufficiency. RECOMMENDATIONS:  Consider multivessel coronary bypass grafting given  diabetes, relatively young age, and LV dysfunction.  Discussed with Dr. Illene Bolus.    I have independently reviewed the above radiologic studies and discussed with the patient   Recent Lab Findings: Lab Results  Component Value Date   WBC 5.7 06/09/2020   HGB 10.9 (L) 06/09/2020   HGB 10.5 (L) 06/09/2020   HCT 32.0 (L) 06/09/2020   HCT 31.0 (L) 06/09/2020   PLT 166 06/09/2020   GLUCOSE 401 (H) 06/09/2020   CHOL 166 06/09/2020   TRIG 128 06/09/2020   HDL 41 06/09/2020   LDLCALC 99 06/09/2020   ALT 14 06/06/2020   AST 19 06/06/2020   NA 144 06/09/2020   NA 144 06/09/2020   K 3.5 06/09/2020   K 3.5 06/09/2020   CL 107 06/09/2020   CREATININE 1.76 (H) 06/09/2020   BUN 15 06/09/2020   CO2 22 06/09/2020   TSH 0.492 11/08/2012   INR 1.0 06/08/2020   HGBA1C 8.9 (A) 02/17/2020    Assessment / Plan:    1. CAD- patient with chest pain at times, shortness  of breath at times, ruled in for NSTEMI... requesting CABG consult 2. Recent COVID-19 infection-- patient feels like she hasn't really recovered well.. she has been experiencing persistent fatigue and shortness of breath since that time 3. DM- per patient good control prior to admission, sugar was 595 on admission, currently sugars have been under good control... blood sugar control would be imperative to optimize longevity of vein grafts 4. CKD Stage III- creatinine on arrival was 2.48, today level is 1.76 and will likely remain near baseline post cath 5. Deconditioning- patient never fully recovered from [place] with decreased energy level, fatigue and shortness of breath... bypass surgery would definitely exacerbate this 6. Dispo- patient currently chest pain free, recent COVID infection that she hasn't fully recovered from and remains debilitated, blood sugar control is not improved, CKD Stage III with baseline creatinine around 2.... patient will likely be high risk for surgery due to debility prior to surgery... Dr. Servando Snare is aware of patient and will follow up with further recommendations... the patient would like surgery if she is felt to be a candidate... preoperative workup as patient has left carotid bruit and previous vein procedures on lower extremities   I  spent 55 minutes counseling the patient face to face.   Ellwood Handler, PA-C 06/09/2020 3:14 PM  Patient seen, history reviewed, films reviewed.  The patient has diffuse coronary artery disease, the right coronary artery is not bypassable, the branches of the circumflex have diffuse distal disease, the LAD is a reasonable vessel.  With the patient's overall medical condition, especially continued poorly controlled diabetes with the presentation in diabetic ketoacidosis, with her severe neuropathy lower extremities, cerebrovascular disease peripheral vascular disease and from a functional status has not recovered from her recent Covid  infection I do not think she would make a satisfactory recovery after bypass in her current state.  Would recommend aggressive medical therapy for her coronary artery disease, and diabetes and heart failure.  If her medical condition improves where she could make a reasonable recovery following bypass surgery it could be reconsidered.   Grace Isaac MD      [address] [place] [address] Office [phone]

## 2020-06-09 NOTE — Progress Notes (Addendum)
HD#3 Subjective:  Overnight Events: None  Patient examined at bedside. Feels well after procedure today, no pain. No acute complaints. Discussed results of cath today and plan for TCTS evaluation for CABG.   Objective:  Vital signs in last 24 hours: Vitals:   06/08/20 0400 06/08/20 1202 06/08/20 2018 06/09/20 0344  BP: (!) 158/67 (!) 142/76 (!) 108/53 138/66  Pulse: 78 84 83 81  Resp: 19 18 (!) 21 (!) 25  Temp: 98.8 F (37.1 C) 98.5 F (36.9 C) 98.6 F (37 C) 98.2 F (36.8 C)  TempSrc: Oral Oral Oral Oral  SpO2: 98% 97% 97% 99%  Weight:    77.9 kg  Height:       Supplemental O2: Room Air SpO2: 99 %   Physical Exam:  Blood pressure 133/80, pulse 88, temperature 98.7 F (37.1 C), temperature source Oral, resp. rate 16, height '5\' 3"'$  (1.6 m), weight 81.7 kg, SpO2 96 %.  Constitutional: Patient laying in bed appears comfortable, in no acute distress Respiratory: Lungs clear to ascultation, no rales or rhonchi, no increased work of breathing Cardiovascular: Regular rate and rhythm, no murmurs,  no lower extremity edema Abdomen: Soft, non tender, normal BS Skin: warm, dry  Filed Weights   06/07/20 1432 06/08/20 0048 06/09/20 0344  Weight: 76.4 kg 77.2 kg 77.9 kg     Intake/Output Summary (Last 24 hours) at 06/09/2020 0707 Last data filed at 06/09/2020 0517 Gross per 24 hour  Intake 904.49 ml  Output 1500 ml  Net -595.51 ml   Net IO Since Admission: 1,380.18 mL [06/09/20 0707]  Pertinent Labs: CBC Latest Ref Rng & Units 06/09/2020 06/08/2020 06/07/2020  WBC 4.0 - 10.5 K/uL 5.7 6.8 6.6  Hemoglobin 12.0 - 15.0 g/dL 11.1(L) 11.2(L) 11.3(L)  Hematocrit 36.0 - 46.0 % 33.6(L) 34.9(L) 36.1  Platelets 150 - 400 K/uL 166 162 194    CMP Latest Ref Rng & Units 06/09/2020 06/08/2020 06/07/2020  Glucose 70 - 99 mg/dL 401(H) 342(H) 117(H)  BUN 8 - 23 mg/dL '15 15 16  '$ Creatinine 0.44 - 1.00 mg/dL 1.76(H) 1.91(H) 1.80(H)  Sodium 135 - 145 mmol/L 138 135 139  Potassium 3.5 - 5.1 mmol/L  3.8 3.8 3.7  Chloride 98 - 111 mmol/L 107 108 111  CO2 22 - 32 mmol/L 22 19(L) 19(L)  Calcium 8.9 - 10.3 mg/dL 8.6(L) 8.2(L) 8.2(L)  Total Protein 6.5 - 8.1 g/dL - - -  Total Bilirubin 0.3 - 1.2 mg/dL - - -  Alkaline Phos 38 - 126 U/L - - -  AST 15 - 41 U/L - - -  ALT 0 - 44 U/L - - -   Imaging: ECHOCARDIOGRAM COMPLETE  Result Date: 06/06/2020 IMPRESSIONS   1. When compared to the prior study from 09/13/2018 LVEF has decreased 55-60% to 25-30% with diffuse hypokinesis.   2. Left ventricular ejection fraction, by estimation, is 25 to 30%. The left ventricle has severely decreased function. The left ventricle demonstrates global hypokinesis. There is severe concentric left ventricular hypertrophy. Left ventricular diastolic parameters are consistent with Grade I diastolic dysfunction (impaired relaxation). Elevated left ventricular end-diastolic pressure.   3. Right ventricular systolic function is normal. The right ventricular size is normal.   4. Left atrial size was mildly dilated.   5. The pericardial effusion is posterior to the left ventricle.   6. The mitral valve is normal in structure. Mild mitral valve regurgitation. No evidence of mitral stenosis.   7. The aortic valve is normal in structure.  There is moderate calcification of the aortic valve. There is moderate thickening of the aortic valve. Aortic valve regurgitation is mild. Mild to moderate aortic valve sclerosis/calcification is present, without any evidence of aortic stenosis.   8. The inferior vena cava is normal in size with greater than 50% respiratory variability, suggesting right atrial pressure of 3 mmHg.   Assessment/Plan:   Principal Problem:   NSTEMI (non-ST elevated myocardial infarction) ([place]) Active Problems:   HTN (hypertension)   CKD (chronic kidney disease) stage 3, GFR 30-59 ml/min (HCC)   Chronic combined systolic and diastolic CHF, NYHA class 2 (HCC)   Type II diabetes mellitus with stage 3 chronic  kidney disease ([place])   Patient Summary: [name] is a 65 y/o female with history of type II DM, HFrEF, CAD, COPD, HTN, CKD IIIb and PVD who presents with one week of intermittent chest pain.  # NSTEMI  # HFrEF #CAD Chest pain and SOB likely NSTEMI in the setting of DKA and hypotension. However echo now with reduced ED of 25-30% with pericardial effusion. Lipid panel with LDL of 99 may benefit from starting addition lipid lowering agent. Angiography  Today with severe triple vessel disease, cardiology recommending CABG, TCTS to evaluate. - Cardiology consulted, appreciate their recommendations - TCTS consulted, appreciate reccomendations - ASA '81mg'$  daily - Lipitor '40mg'$  daily, Coreg 12.5 twice daily, and amlodipine 5 mg daily - Hold losartan until angiography  - follow up angiography results - NTG and Zofran PRN - strict I/Os - Continuous telemetry and pulse ox   # DKA  # non insulin dependent type 2 diabetes  Presented with DKA not taking medication prior to admission due to feeling unwell after COVID. Unfortunately CBG this morning in 400s due to reduction in basal insulin. Will increase lantus tonight - Increase to Lantus 15 units daily - SSI moderate - A1c - CBG monitoring - Will plan to discharge her on home trulicity, restart metformin, and Lantus.   # AKI on CKD Creatinine 1.7 today. Baseline creatinine ~1.7.  -Monitor BMP - Hold home losartan  # HLD - Continue home atorvastatin '40mg'$  daily   # GERD - Continue Protonix '40mg'$  daily  Code Status:Full code  Diet:Carb modified, heart healthy IVF:NS 50 mL DVT PPx: On heparin infusion  Dispo: Anticipated discharge to Home in 5 days pending medical mangement.   Alyssa Beard, MD 06/09/2020, 7:07 AM Pager: [phone]  Please contact the on call pager after 5 pm and on weekends at [phone].

## 2020-06-09 NOTE — Progress Notes (Signed)
Physical Therapy Treatment Patient Details Name: [name] MRN: [id number] DOB: 1956-01-27 Today's Date: 06/09/2020    History of Present Illness Pt is a 65 y.o. female who presents with mild diabetic ketoacidosis with progressive weakness over past week with confusion. She was also found to have chest pain and elevated troponin, likely a type II NSTEMI in the setting of hypotension and SKA with known ischemic heart disease. PMH: DM2, HFrecEF, CAD, COPD, HTN, CKD IIIb, MI, arthritis, and PVD.    PT Comments    Pt progressing steadily towards her physical therapy goals, demonstrating improved ambulation distance and activity tolerance this session. Pt ambulating x 200 feet with a walker at a min guard assist level. HR 70-80's. Demonstrates gait abnormalities, balance deficits and decreased endurance. Will continue to follow acutely to progress as tolerated.     Follow Up Recommendations  Outpatient PT;Supervision for mobility/OOB     Equipment Recommendations  3in1 (PT)    Recommendations for Other Services       Precautions / Restrictions Precautions Precautions: Fall Restrictions Weight Bearing Restrictions: No    Mobility  Bed Mobility Overal bed mobility: Needs Assistance Bed Mobility: Supine to Sit;Sit to Supine     Supine to sit: Min assist Sit to supine: Min assist   General bed mobility comments: MinA for trunk assist to upright as pt trying not to use her RUE post cath. MinA for LE negotiation back into bed  Transfers Overall transfer level: Needs assistance Equipment used: Rolling walker (2 wheeled) Transfers: Sit to/from Stand Sit to Stand: Min guard         General transfer comment: Min guard for safety with transition to standing x 2  Ambulation/Gait Ambulation/Gait assistance: Min guard Gait Distance (Feet): 200 Feet Assistive device: Rolling walker (2 wheeled) Gait Pattern/deviations: Decreased stride length;Step-through  pattern;Antalgic;Trendelenburg Gait velocity: reduced   General Gait Details: Antalgic/Trendelenberg gait pattern (pt baseline), requiring min guard assist for stability. Pt with x 1 episode of running into obstacle but able to self correct. Cues for activity pacing   Stairs             Wheelchair Mobility    Modified Rankin (Stroke Patients Only)       Balance Overall balance assessment: Needs assistance Sitting-balance support: Feet supported Sitting balance-Leahy Scale: Good     Standing balance support: Bilateral upper extremity supported;Single extremity supported Standing balance-Leahy Scale: Poor                              Cognition Arousal/Alertness: Awake/alert Behavior During Therapy: WFL for tasks assessed/performed Overall Cognitive Status: Within Functional Limits for tasks assessed                                        Exercises      General Comments General comments (skin integrity, edema, etc.): HR 70-80's      Pertinent Vitals/Pain Pain Assessment: No/denies pain    Home Living                      Prior Function            PT Goals (current goals can now be found in the care plan section) Acute Rehab PT Goals Patient Stated Goal: have CABG to be able to "watch her grands." Potential to Achieve Goals:  Good Progress towards PT goals: Progressing toward goals    Frequency    Min 3X/week      PT Plan Current plan remains appropriate    Co-evaluation              AM-PAC PT "6 Clicks" Mobility   Outcome Measure  Help needed turning from your back to your side while in a flat bed without using bedrails?: None Help needed moving from lying on your back to sitting on the side of a flat bed without using bedrails?: A Little Help needed moving to and from a bed to a chair (including a wheelchair)?: A Little Help needed standing up from a chair using your arms (e.g., wheelchair or bedside  chair)?: A Little Help needed to walk in hospital room?: A Little Help needed climbing 3-5 steps with a railing? : A Lot 6 Click Score: 18    End of Session Equipment Utilized During Treatment: Gait belt Activity Tolerance: Patient tolerated treatment well Patient left: with call bell/phone within reach;in chair;with chair alarm set Nurse Communication: Mobility status PT Visit Diagnosis: Unsteadiness on feet (R26.81);Other abnormalities of gait and mobility (R26.89);Muscle weakness (generalized) (M62.81);History of falling (Z91.81);Difficulty in walking, not elsewhere classified (R26.2)     Time: [id number] PT Time Calculation (min) (ACUTE ONLY): 34 min  Charges:  $Gait Training: 8-22 mins $Therapeutic Activity: 8-22 mins                     Wyona Almas, PT, DPT Acute Rehabilitation Services Pager [phone] Office [phone]    Deno Etienne 06/09/2020, 5:14 PM

## 2020-06-09 NOTE — Interval H&P Note (Signed)
Cath Lab Visit (complete for each Cath Lab visit)  Clinical Evaluation Leading to the Procedure:   ACS: Yes.    Non-ACS:    Anginal Classification: CCS III  Anti-ischemic medical therapy: Maximal Therapy (2 or more classes of medications)  Non-Invasive Test Results: High-risk stress test findings: cardiac mortality >3%/year  Prior CABG: No previous CABG      History and Physical Interval Note:  06/09/2020 10:23 AM  [name]  has presented today for surgery, with the diagnosis of nstemi.  The various methods of treatment have been discussed with the patient and family. After consideration of risks, benefits and other options for treatment, the patient has consented to  Procedure(s): RIGHT/LEFT HEART CATH AND CORONARY ANGIOGRAPHY (N/A) as a surgical intervention.  The patient's history has been reviewed, patient examined, no change in status, stable for surgery.  I have reviewed the patient's chart and labs.  Questions were answered to the patient's satisfaction.     [name] III

## 2020-06-09 NOTE — Progress Notes (Signed)
[place] for Heparin Indication: chest pain/ACS  Allergies  Allergen Reactions  . Lisinopril Cough  . Plavix [Clopidogrel Bisulfate] Rash    Patient Measurements: Height: '5\' 3"'$  (160 cm) Weight: 77.9 kg (171 lb 11.8 oz) IBW/kg (Calculated) : 52.4 Heparin Dosing Weight: 69 kg  Vital Signs: Temp: 98.2 F (36.8 C) (02/08 0344) Temp Source: Oral (02/08 0344) BP: 138/66 (02/08 0344) Pulse Rate: 81 (02/08 0344)  Labs: Recent Labs    06/07/20 0346 06/07/20 0809 06/08/20 0228 06/08/20 2058 06/09/20 0320  HGB 11.3*  --  11.2*  --  11.1*  HCT 36.1  --  34.9*  --  33.6*  PLT 194  --  162  --  166  LABPROT  --   --   --  12.7  --   INR  --   --   --  1.0  --   HEPARINUNFRC 0.35  --  0.49  --  0.41  CREATININE 1.82* 1.80* 1.91*  --  1.76*    Estimated Creatinine Clearance: 32.3 mL/min (A) (by C-G formula based on SCr of 1.76 mg/dL (H)).   Medical History: Past Medical History:  Diagnosis Date  . Anemia 07/17/2013  . Arthritis    "hands, shoulders, hips, legs" (05/10/2018)  . Arthritis of left hip 08/18/2013   S/p total hip arthroplasty.    . CHF (congestive heart failure) ([place])    2014...@ [facility]  . Chronic lower back pain   . CKD (chronic kidney disease) stage 3, GFR 30-59 ml/min (HCC)   . COPD (chronic obstructive pulmonary disease) ([place])   . Healthcare maintenance 06/16/2015  . High cholesterol   . Hypertension   . Influenza A 06/01/2017  . Influenza, pneumonia 06/01/2017  . Left flank pain 10/11/2016  . Myocardial infarction [facility])    "been told that I've had one; don't know when it would have been" (05/10/2018)  . Problem with sexual relationship 10/13/2017  . PVD (peripheral vascular disease) ([place])   . Shortness of breath 03/12/2019  . Type II diabetes mellitus ([place])    diagnosed 2001     Assessment: 65 y.o. F presents with CP on heparin for NSTEMI No AC PTA. CBC ok at baseline. Plans noted for cath today 06/09/20.  Heparin level  is therapeutic today on heparin 900 units/hr.    Goal of Therapy:  Heparin level 0.3-0.7 units/ml Monitor platelets by anticoagulation protocol: Yes   Plan:  Heparin stopped per MD this AM- Patient now in Cath lab.  F/u plan post Cath Daily heparin level, CBC, s/s bleeding  Nicole Cella, RPh Clinical Pharmacist **Pharmacist phone directory can now be found on [email or web address] ([email or web address]).  Listed under [place]. 06/09/2020 10:03 AM

## 2020-06-09 NOTE — CV Procedure (Signed)
   Severe three-vessel disease with chronic total occlusion of the RCA, significant stenosis in the mid LAD, severe stenosis in the first diagonal (small), and 80% proximal to mid circumflex, 95% first obtuse marginal, 95% diffuse disease second obtuse marginal.  Echo shows diffuse hypokinesis with EF of 30%.  LVEDP is normal.  Discussed with Dr. Irish Lack.  Surgical consultation to consider revascularization.

## 2020-06-09 NOTE — Progress Notes (Signed)
TCTS consulted for CABG evaluation. °

## 2020-06-09 NOTE — Progress Notes (Signed)
[place] for Heparin Indication: chest pain/ACS  Allergies  Allergen Reactions  . Lisinopril Cough  . Plavix [Clopidogrel Bisulfate] Rash    Patient Measurements: Height: '5\' 3"'$  (160 cm) Weight: 77.9 kg (171 lb 11.8 oz) IBW/kg (Calculated) : 52.4 Heparin Dosing Weight: 69 kg  Vital Signs: Temp: 98.2 F (36.8 C) (02/08 0344) Temp Source: Oral (02/08 0344) BP: 131/78 (02/08 1150) Pulse Rate: 73 (02/08 1150)  Labs: Recent Labs    06/07/20 0346 06/07/20 0809 06/08/20 0228 06/08/20 2058 06/09/20 0320  HGB 11.3*  --  11.2*  --  11.1*  HCT 36.1  --  34.9*  --  33.6*  PLT 194  --  162  --  166  LABPROT  --   --   --  12.7  --   INR  --   --   --  1.0  --   HEPARINUNFRC 0.35  --  0.49  --  0.41  CREATININE 1.82* 1.80* 1.91*  --  1.76*    Estimated Creatinine Clearance: 32.3 mL/min (A) (by C-G formula based on SCr of 1.76 mg/dL (H)).   Medical History: Past Medical History:  Diagnosis Date  . Anemia 07/17/2013  . Arthritis    "hands, shoulders, hips, legs" (05/10/2018)  . Arthritis of left hip 08/18/2013   S/p total hip arthroplasty.    . CHF (congestive heart failure) ([place])    2014...@ [facility]  . Chronic lower back pain   . CKD (chronic kidney disease) stage 3, GFR 30-59 ml/min (HCC)   . COPD (chronic obstructive pulmonary disease) ([place])   . Healthcare maintenance 06/16/2015  . High cholesterol   . Hypertension   . Influenza A 06/01/2017  . Influenza, pneumonia 06/01/2017  . Left flank pain 10/11/2016  . Myocardial infarction [facility])    "been told that I've had one; don't know when it would have been" (05/10/2018)  . Problem with sexual relationship 10/13/2017  . PVD (peripheral vascular disease) ([place])   . Shortness of breath 03/12/2019  . Type II diabetes mellitus ([place])    diagnosed 2001     Assessment: 65 y.o. F presents with CP on heparin for NSTEMI No AC PTA. CBC ok at baseline. Plans noted for cath today 06/09/20.  Heparin level  this AM was therapeutic today on heparin 900 units/hr.   Heparin drip stopped this AM prior to cardiac cath.  Now s/p cath> severe 3 vessel CAD.  Pharmacy consulted to restart IV heparin drip 8 hours post sheath removal.     Goal of Therapy:  Heparin level 0.3-0.7 units/ml Monitor platelets by anticoagulation protocol: Yes   Plan:  Restart Heparin drip 900 units/hr 8 hours post sheath removal , restart time at 19:15 tonight. Check 8 hour heparin level at 0300 06/10/20. Daily heparin level, CBC, s/s bleeding  Nicole Cella, RPh Clinical Pharmacist **Pharmacist phone directory can now be found on [email or web address] ([email or web address]).  Listed under [place]. 06/09/2020 12:55 PM

## 2020-06-10 DIAGNOSIS — E1122 Type 2 diabetes mellitus with diabetic chronic kidney disease: Secondary | ICD-10-CM | POA: Diagnosis not present

## 2020-06-10 DIAGNOSIS — R739 Hyperglycemia, unspecified: Secondary | ICD-10-CM | POA: Diagnosis not present

## 2020-06-10 DIAGNOSIS — K2100 Gastro-esophageal reflux disease with esophagitis, without bleeding: Secondary | ICD-10-CM | POA: Diagnosis not present

## 2020-06-10 DIAGNOSIS — I5042 Chronic combined systolic (congestive) and diastolic (congestive) heart failure: Secondary | ICD-10-CM | POA: Diagnosis not present

## 2020-06-10 DIAGNOSIS — E1310 Other specified diabetes mellitus with ketoacidosis without coma: Secondary | ICD-10-CM | POA: Diagnosis not present

## 2020-06-10 DIAGNOSIS — N1831 Chronic kidney disease, stage 3a: Secondary | ICD-10-CM | POA: Diagnosis not present

## 2020-06-10 DIAGNOSIS — I214 Non-ST elevation (NSTEMI) myocardial infarction: Secondary | ICD-10-CM | POA: Diagnosis not present

## 2020-06-10 DIAGNOSIS — N178 Other acute kidney failure: Secondary | ICD-10-CM | POA: Diagnosis not present

## 2020-06-10 DIAGNOSIS — E785 Hyperlipidemia, unspecified: Secondary | ICD-10-CM | POA: Diagnosis not present

## 2020-06-10 LAB — CBC
HCT: 33.9 % — ABNORMAL LOW (ref 36.0–46.0)
Hemoglobin: 10.6 g/dL — ABNORMAL LOW (ref 12.0–15.0)
MCH: 28.8 pg (ref 26.0–34.0)
MCHC: 31.3 g/dL (ref 30.0–36.0)
MCV: 92.1 fL (ref 80.0–100.0)
Platelets: 176 10*3/uL (ref 150–400)
RBC: 3.68 MIL/uL — ABNORMAL LOW (ref 3.87–5.11)
RDW: 15.7 % — ABNORMAL HIGH (ref 11.5–15.5)
WBC: 5.6 10*3/uL (ref 4.0–10.5)
nRBC: 0 % (ref 0.0–0.2)

## 2020-06-10 LAB — GLUCOSE, CAPILLARY
Glucose-Capillary: 322 mg/dL — ABNORMAL HIGH (ref 70–99)
Glucose-Capillary: 132 mg/dL — ABNORMAL HIGH (ref 70–99)
Glucose-Capillary: 115 mg/dL — ABNORMAL HIGH (ref 70–99)
Glucose-Capillary: 158 mg/dL — ABNORMAL HIGH (ref 70–99)

## 2020-06-10 LAB — BASIC METABOLIC PANEL
Anion gap: 8 (ref 5–15)
BUN: 14 mg/dL (ref 8–23)
CO2: 23 mmol/L (ref 22–32)
Calcium: 8.6 mg/dL — ABNORMAL LOW (ref 8.9–10.3)
Chloride: 110 mmol/L (ref 98–111)
Creatinine, Ser: 1.7 mg/dL — ABNORMAL HIGH (ref 0.44–1.00)
GFR, Estimated: 33 mL/min — ABNORMAL LOW (ref >60)
Glucose, Bld: 360 mg/dL — ABNORMAL HIGH (ref 70–99)
Potassium: 4 mmol/L (ref 3.5–5.1)
Sodium: 141 mmol/L (ref 135–145)

## 2020-06-10 LAB — HEPARIN LEVEL (UNFRACTIONATED): Heparin Unfractionated: 0.5 IU/mL (ref 0.30–0.70)

## 2020-06-10 LAB — HEMOGLOBIN A1C
Hgb A1c MFr Bld: >15.5 % — ABNORMAL HIGH (ref 4.8–5.6)
Mean Plasma Glucose: >398 mg/dL

## 2020-06-10 MED ORDER — SODIUM CHLORIDE 0.9% FLUSH
3.0000 mL | Freq: Two times a day (BID) | INTRAVENOUS | Status: DC
  Administered 2020-06-10 – 2020-06-12 (×2): 3 mL via INTRAVENOUS

## 2020-06-10 MED ORDER — TICAGRELOR 90 MG PO TABS
180.0000 mg | ORAL_TABLET | Freq: Once | ORAL | Status: AC
  Administered 2020-06-10: 180 mg via ORAL
  Filled 2020-06-10: qty 2

## 2020-06-10 MED ORDER — INSULIN GLARGINE 100 UNIT/ML ~~LOC~~ SOLN
20.0000 [IU] | Freq: Every day | SUBCUTANEOUS | Status: DC
  Administered 2020-06-10 – 2020-06-11 (×2): 20 [IU] via SUBCUTANEOUS
  Filled 2020-06-10 (×4): qty 0.2

## 2020-06-10 MED ORDER — HEPARIN (PORCINE) 25000 UT/250ML-% IV SOLN
900.0000 [IU]/h | INTRAVENOUS | Status: DC
  Filled 2020-06-10: qty 250

## 2020-06-10 MED ORDER — COVID-19 MRNA VAC-TRIS(PFIZER) 30 MCG/0.3ML IM SUSP
0.3000 mL | Freq: Once | INTRAMUSCULAR | Status: AC
  Administered 2020-06-10: 0.3 mL via INTRAMUSCULAR
  Filled 2020-06-10: qty 0.3

## 2020-06-10 MED ORDER — TICAGRELOR 90 MG PO TABS: 90.0000 mg | ORAL_TABLET | Freq: Two times a day (BID) | ORAL | Status: DC

## 2020-06-10 MED ORDER — TICAGRELOR 90 MG PO TABS
90.0000 mg | ORAL_TABLET | Freq: Two times a day (BID) | ORAL | Status: DC
  Administered 2020-06-11 – 2020-06-12 (×3): 90 mg via ORAL
  Filled 2020-06-10 (×3): qty 1

## 2020-06-10 MED ORDER — INSULIN ASPART 100 UNIT/ML ~~LOC~~ SOLN
4.0000 [IU] | Freq: Three times a day (TID) | SUBCUTANEOUS | Status: DC
  Administered 2020-06-11 – 2020-06-12 (×3): 4 [IU] via SUBCUTANEOUS

## 2020-06-10 MED ORDER — TICAGRELOR 90 MG PO TABS: 180.0000 mg | ORAL_TABLET | Freq: Once | ORAL | Status: DC

## 2020-06-10 MED ORDER — INSULIN ASPART 100 UNIT/ML ~~LOC~~ SOLN
4.0000 [IU] | Freq: Three times a day (TID) | SUBCUTANEOUS | Status: DC
  Administered 2020-06-10 (×2): 4 [IU] via SUBCUTANEOUS

## 2020-06-10 MED ORDER — INSULIN ASPART 100 UNIT/ML ~~LOC~~ SOLN
0.0000 [IU] | Freq: Three times a day (TID) | SUBCUTANEOUS | Status: DC
  Administered 2020-06-11: 5 [IU] via SUBCUTANEOUS
  Administered 2020-06-11 – 2020-06-12 (×2): 2 [IU] via SUBCUTANEOUS
  Administered 2020-06-12: 5 [IU] via SUBCUTANEOUS

## 2020-06-10 MED ORDER — HEPARIN SODIUM (PORCINE) 5000 UNIT/ML IJ SOLN
5000.0000 [IU] | Freq: Three times a day (TID) | INTRAMUSCULAR | Status: DC
  Administered 2020-06-11: 5000 [IU] via SUBCUTANEOUS
  Filled 2020-06-10: qty 1

## 2020-06-10 NOTE — Progress Notes (Signed)
Heart Failure Stewardship Pharmacist Progress Note   PCP: Marty Heck, DO PCP-Cardiologist: Sinclair Grooms, MD    HPI:  65 yo F with PMH of T2DM, HFrEF, CAD, COPD, HTN, CKD IIIb, and PVD. She presented to the ED on 06/06/20 with chest pain and was admitted for NSTEMI. She was not taking any of her PTA medications regularly since her recent hospitalization for COVID-19 in December. She was ultimately also found to be in DKA and with an AKI. An ECHO was done on 06/06/20 and her LVEF is reduced to 25-30% (was 55-60% in May 2020 and 30-35% in December 2019). R/LHC was done on 06/09/20 and she was found to have severe diffuse 3-vessel CAD. RA 3, PA 15, PCWP 6, CO 4.99, CI 2.75. She was not deemed to be a candidate for CABG at this time per TCTS. Plan for elective PCI to LAD on 06/11/20.  Current HF Medications: Carvedilol 12.5 mg BID  Prior to admission HF Medications: Furosemide 20 mg daily PRN Carvedilol 12.5 mg BID Losartan 100 mg daily Jardiance 25 mg daily  Pertinent Lab Values: . Serum creatinine 1.70, BUN 14, Potassium 4.0, Sodium 141, BNP 953.9  Vital Signs: . Weight: 171 lbs (admission weight: 180 lbs) . Blood pressure: 130/70s  . Heart rate: 80s   Medication Assistance / Insurance Benefits Check: Does the patient have prescription insurance?  Yes Type of insurance plan: [place] Medicaid  Outpatient Pharmacy:  Prior to admission outpatient pharmacy: [facility] outpatient pharmacy Is the patient willing to use [facility] pharmacy at discharge? Yes   Assessment: 1. Acute on chronic systolic CHF (EF 123XX123), due to ICM, medication noncompliance. NYHA class II/III symptoms. - Continue carvedilol 12.5 mg BID - Would discontinue amlodipine to allow for GDMT for HF therapy - Consider starting Entresto 24/26 mg BID after cath - Consider starting spironolactone and/or Farxiga prior to discharge   Plan: 1) Medication changes recommended at this time: - Stop amlodipine - Start Entresto 24/26  mg BID after cath tomorrow  2) Patient assistance application(s): - None pending  3)  Education  - To be completed prior to discharge  Kerby Nora, PharmD, BCPS Heart Failure Stewardship Pharmacist Phone [phone]

## 2020-06-10 NOTE — H&P (View-Only) (Signed)
Progress Note  Patient Name: [name] Date of Encounter: 06/10/2020  [facility] Cardiologist: Sinclair Grooms, MD   Subjective   Upon further discussion, she did have some chest discomfort and used her SLT NTG in early Feb 2022. Feels well now.  SHe understands that she was not a candidate for CABG.   Inpatient Medications    Scheduled Meds: . amLODipine  10 mg Oral Daily  . aspirin EC  81 mg Oral Daily  . atorvastatin  40 mg Oral q1800  . carvedilol  12.5 mg Oral BID WC  . COVID-19 mRNA Vac-TriS ([employer])  0.3 mL Intramuscular Once  . insulin aspart  0-15 Units Subcutaneous TID WC  . insulin aspart  4 Units Subcutaneous TID WC  . insulin glargine  20 Units Subcutaneous QHS  . pantoprazole  40 mg Oral Daily  . sodium chloride flush  3 mL Intravenous Q12H  . ticagrelor  180 mg Oral Once   Followed by  . [START ON 06/11/2020] ticagrelor  90 mg Oral BID   Continuous Infusions: . sodium chloride    . heparin 900 Units/hr (06/10/20 1429)   PRN Meds: sodium chloride, acetaminophen, albuterol, dextrose, nitroGLYCERIN, ondansetron (ZOFRAN) IV, polyethylene glycol, senna, sodium chloride flush   Vital Signs    Vitals:   06/09/20 2035 06/10/20 0353 06/10/20 0825 06/10/20 1007  BP: 128/68 139/75    Pulse: 76 77    Resp: 18 19    Temp: 98.6 F (37 C) 98.4 F (36.9 C) 98.3 F (36.8 C)   TempSrc: Oral Oral Oral   SpO2: 100% 99%    Weight:    77.7 kg  Height:        Intake/Output Summary (Last 24 hours) at 06/10/2020 1531 Last data filed at 06/10/2020 [id number] Gross per 24 hour  Intake 1497.43 ml  Output 2450 ml  Net -952.57 ml   Last 3 Weights 06/10/2020 06/09/2020 06/08/2020  Weight (lbs) 171 lb 4.8 oz 171 lb 11.8 oz 170 lb 3.1 oz  Weight (kg) 77.7 kg 77.9 kg 77.2 kg      Telemetry    NSR- Personally Reviewed  ECG    NSR, LBBB - Personally Reviewed  Physical Exam   GEN: No acute distress.   Neck: No JVD Cardiac: RRR, no murmurs, rubs, or gallops.   Respiratory: Clear to auscultation bilaterally. GI: Soft, nontender, non-distended  MS: No edema; No deformity. Neuro:  Nonfocal  Psych: Normal affect   Labs    High Sensitivity Troponin:   Recent Labs  Lab 06/06/20 0227 06/06/20 0455  TROPONINIHS 2,410* 2,238*      Chemistry Recent Labs  Lab 06/06/20 0227 06/06/20 0258 06/09/20 0320 06/09/20 1042 06/09/20 1048 06/09/20 1511 06/10/20 0324  NA 128*   < > 138   < > 144  144 139 141  K 4.7   < > 3.8   < > 3.5  3.5 4.0 4.0  CL 96*   < > 107  --   --  110 110  CO2 18*   < > 22  --   --  20* 23  GLUCOSE 661*   < > 401*  --   --  203* 360*  BUN 32*   < > 15  --   --  12 14  CREATININE 2.48*   < > 1.76*  --   --  1.68* 1.70*  CALCIUM 8.3*   < > 8.6*  --   --  8.3* 8.6*  PROT 6.2*  --   --   --   --   --   --   ALBUMIN 2.7*  --   --   --   --   --   --   AST 19  --   --   --   --   --   --   ALT 14  --   --   --   --   --   --   ALKPHOS 80  --   --   --   --   --   --   BILITOT 1.2  --   --   --   --   --   --   GFRNONAA 21*   < > 32*  --   --  34* 33*  ANIONGAP 14   < > 9  --   --  9 8   < > = values in this interval not displayed.     Hematology Recent Labs  Lab 06/08/20 0228 06/09/20 0320 06/09/20 1042 06/09/20 1048 06/10/20 0324  WBC 6.8 5.7  --   --  5.6  RBC 3.88 3.79*  --   --  3.68*  HGB 11.2* 11.1* 11.2* 10.5*  10.9* 10.6*  HCT 34.9* 33.6* 33.0* 31.0*  32.0* 33.9*  MCV 89.9 88.7  --   --  92.1  MCH 28.9 29.3  --   --  28.8  MCHC 32.1 33.0  --   --  31.3  RDW 15.2 15.4  --   --  15.7*  PLT 162 166  --   --  176    BNP Recent Labs  Lab 06/06/20 0228  BNP 953.9*     DDimer No results for input(s): DDIMER in the last 168 hours.   Radiology    CARDIAC CATHETERIZATION  Result Date: 06/09/2020  Severe diffuse three-vessel coronary artery disease.  40 to 50% ostial LAD, 85% mid LAD.  Severe obstruction first obtuse marginal/ramus intermedius.  85% proximal to mid circumflex stenosis.   Severe diffuse disease in each of 3 obtuse marginal branches.  Total occlusion of the proximal to mid RCA.  LVEDP is normal.  Left ventriculography was not performed because of renal insufficiency. RECOMMENDATIONS:  Consider multivessel coronary bypass grafting given diabetes, relatively young age, and LV dysfunction.  Discussed with Dr. Illene Bolus.   Cardiac Studies   diagnostic cath reviewed  Patient Profile     65 y.o. female with NSTEMI in the setting of DKA  Assessment & Plan    Worsening LAD disease compared to prior cath.  THis is the only lesion amenable to PCI.  Turned down for CABG. Given her low EF, this may be beneficial to help improve LVEF.  Discussed possibility of PCI of LAD with patient.  SHe is agreeable.  WIll also discuss with her regular cardiologist.  Loaded with [place].  OK to stop heparin.      For questions or updates, please contact [place] Please consult [email or web address] for contact info under        Signed, Larae Grooms, MD  06/10/2020, 3:31 PM

## 2020-06-10 NOTE — Progress Notes (Addendum)
HD#4 Subjective:  Overnight Events: None  Patient examined at bedside. She is resting comfortably in bed. She notes that she was able to have a bowel movement this morning. Discussed with her the findings of the angiography yesterday and recommendations from CTS. She does want to get her CABG when she is medically optimized.  Discussed diabetes regimen on discharge  Objective:  Vital signs in last 24 hours: Vitals:   06/09/20 1150 06/09/20 1700 06/09/20 2035 06/10/20 0353  BP: 131/78  128/68 139/75  Pulse: 73  76 77  Resp: '20  18 19  '$ Temp:  98.5 F (36.9 C) 98.6 F (37 C) 98.4 F (36.9 C)  TempSrc:  Oral Oral Oral  SpO2:   100% 99%  Weight:      Height:       Supplemental O2: Room Air SpO2: 99 % O2 Flow Rate (L/min): 2 L/min   Physical Exam:  Blood pressure 133/80, pulse 88, temperature 98.7 F (37.1 C), temperature source Oral, resp. rate 16, height '5\' 3"'$  (1.6 m), weight 81.7 kg, SpO2 96 %.  Constitutional: Patient laying in bed, comfortable, in no acute distress Respiratory: Lungs clear to ascultation anteriorly Cardiovascular: Regular rate and rhythm, no murmurs,  no lower extremity edema Abdomen: Soft, non tender, normal BS Skin: warm, dry  Filed Weights   06/07/20 1432 06/08/20 0048 06/09/20 0344  Weight: 76.4 kg 77.2 kg 77.9 kg     Intake/Output Summary (Last 24 hours) at 06/10/2020 0540 Last data filed at 06/10/2020 0500 Gross per 24 hour  Intake 1257.43 ml  Output 2150 ml  Net -892.57 ml   Net IO Since Admission: 487.61 mL [06/10/20 0540]  Pertinent Labs: CBC Latest Ref Rng & Units 06/10/2020 06/09/2020 06/09/2020  WBC 4.0 - 10.5 K/uL 5.6 - -  Hemoglobin 12.0 - 15.0 g/dL 10.6(L) 10.5(L) 10.9(L)  Hematocrit 36.0 - 46.0 % 33.9(L) 31.0(L) 32.0(L)  Platelets 150 - 400 K/uL 176 - -    CMP Latest Ref Rng & Units 06/10/2020 06/09/2020 06/09/2020  Glucose 70 - 99 mg/dL 360(H) 203(H) -  BUN 8 - 23 mg/dL 14 12 -  Creatinine 0.44 - 1.00 mg/dL 1.70(H) 1.68(H) -   Sodium 135 - 145 mmol/L 141 139 144  Potassium 3.5 - 5.1 mmol/L 4.0 4.0 3.5  Chloride 98 - 111 mmol/L 110 110 -  CO2 22 - 32 mmol/L 23 20(L) -  Calcium 8.9 - 10.3 mg/dL 8.6(L) 8.3(L) -  Total Protein 6.5 - 8.1 g/dL - - -  Total Bilirubin 0.3 - 1.2 mg/dL - - -  Alkaline Phos 38 - 126 U/L - - -  AST 15 - 41 U/L - - -  ALT 0 - 44 U/L - - -   Imaging: ECHOCARDIOGRAM COMPLETE  Result Date: 06/06/2020 IMPRESSIONS   1. When compared to the prior study from 09/13/2018 LVEF has decreased 55-60% to 25-30% with diffuse hypokinesis.   2. Left ventricular ejection fraction, by estimation, is 25 to 30%. The left ventricle has severely decreased function. The left ventricle demonstrates global hypokinesis. There is severe concentric left ventricular hypertrophy. Left ventricular diastolic parameters are consistent with Grade I diastolic dysfunction (impaired relaxation). Elevated left ventricular end-diastolic pressure.   3. Right ventricular systolic function is normal. The right ventricular size is normal.   4. Left atrial size was mildly dilated.   5. The pericardial effusion is posterior to the left ventricle.   6. The mitral valve is normal in structure. Mild mitral valve regurgitation.  No evidence of mitral stenosis.   7. The aortic valve is normal in structure. There is moderate calcification of the aortic valve. There is moderate thickening of the aortic valve. Aortic valve regurgitation is mild. Mild to moderate aortic valve sclerosis/calcification is present, without any evidence of aortic stenosis.   8. The inferior vena cava is normal in size with greater than 50% respiratory variability, suggesting right atrial pressure of 3 mmHg.   Right/Left heart cath and coronary angiodynagraphy   Severe diffuse three-vessel coronary artery disease.  40 to 50% ostial LAD, 85% mid LAD.  Severe obstruction first obtuse marginal/ramus intermedius.  85% proximal to mid circumflex stenosis.  Severe  diffuse disease in each of 3 obtuse marginal branches.  Total occlusion of the proximal to mid RCA.  LVEDP is normal.  Left ventriculography was not performed because of renal insufficiency.  Assessment/Plan:   Principal Problem:   NSTEMI (non-ST elevated myocardial infarction) ([place]) Active Problems:   HTN (hypertension)   CKD (chronic kidney disease) stage 3, GFR 30-59 ml/min (HCC)   Chronic combined systolic and diastolic CHF, NYHA class 2 (HCC)   Type II diabetes mellitus with stage 3 chronic kidney disease ([place])   Hyperglycemia  Patient Summary: Ms. Arlt is a 65 y/o female with history of type II DM, HFrEF, CAD, COPD, HTN, CKD IIIb and PVD who presents with one week of intermittent chest pain.  # NSTEMI  # HFrEF #CAD s/p cath on 06/09/2020 Chest pain and SOB likely NSTEMI in the setting of DKA and hypotension. However echo now with reduced ED of 25-30% with pericardial effusion. Cath with severe triple vessel disease. TCTS evaluated doe not feel she is currently a good candidate given uncontrolled diabetes, continue weakness and SOB due to recent COVID infection. Will continue with medical management and revisit surgical intervention in the future once medications more optimized.  - Cardiology consulted, appreciate their recommendations - ASA '81mg'$  daily - Lipitor '40mg'$  daily, Coreg 12.5 twice daily, and increase amlodipine 10 mg daily - Hold losartan until angiography  - Consider transitioning to oral antithrombotic agent, prior rash with plavix, may benefit from brillinta  - NTG and Zofran PRN - strict I/Os - Continuous telemetry and pulse ox  -PT/OT: outpatient PT and OT  # DKA - resolved # non insulin dependent type 2 diabetes  Presented with DKA not taking medication prior to admission due to feeling unwell after COVID. Morning CBG remains elevated to  360. - Increase to Lantus 20 units daily, novolog 4 units with meals - SSI moderate - A1c pending - CBG  monitoring - Will plan to discharge her on home trulicity, restart metformin, and Lantus 20units.   # AKI on CKD Creatinine 1.7 remains at close baseline. -Monitor BMP - Hold home losartan  # HLD Lipid profile with LDL 99 - Continue home atorvastatin '40mg'$  daily   # GERD - Continue Protonix '40mg'$  daily  Code Status:Full code  Diet:Carb modified, heart healthy IVF:NS 50 mL DVT PPx: On heparin infusion  Dispo: Anticipated discharge to Home in 0-2 days pending medical mangement.   Iona Beard, MD 06/10/2020, 5:40 AM Pager: [phone]  Please contact the on call pager after 5 pm and on weekends at [phone].

## 2020-06-10 NOTE — Progress Notes (Signed)
Inpatient Diabetes Program Recommendations  AACE/ADA: New Consensus Statement on Inpatient Glycemic Control (2015)  Target Ranges:  Prepandial:   less than 140 mg/dL      Peak postprandial:   less than 180 mg/dL (1-2 hours)      Critically ill patients:  140 - 180 mg/dL   Lab Results  Component Value Date   GLUCAP 322 (H) 06/10/2020   HGBA1C 8.9 (A) 02/17/2020    Review of Glycemic Control Results for [name], [name] (MRN [id number]) as of 06/10/2020 11:30  Ref. Range 06/09/2020 11:41 06/09/2020 16:30 06/09/2020 21:18 06/10/2020 06:29  Glucose-Capillary Latest Ref Range: 70 - 99 mg/dL 140 (H) 173 (H) 259 (H) 322 (H)    Diabetes history: DM 2 Outpatient Diabetes medications:  Trulicity [id number] mg weekly, Jardiance 25 mg daily, Metformin XR 2000 mg daily Current orders for Inpatient glycemic control:  Novolog moderate tid with meals Lantus 15 units q HS, Novolog 4 units TID  Inpatient Diabetes Program Recommendations:     Will need to titrate insulin up based on fasting blood sugars. Noted addition of meal coverage.  Consider further increasing Lantus to 20 units QHS.   Thanks, Bronson Curb, MSN, RNC-OB Diabetes Coordinator [phone] (8a-5p)

## 2020-06-10 NOTE — Progress Notes (Signed)
Progress Note  Patient Name: [name] Date of Encounter: 06/10/2020  [facility] Cardiologist: Sinclair Grooms, MD   Subjective   Upon further discussion, she did have some chest discomfort and used her SLT NTG in early Feb 2022. Feels well now.  SHe understands that she was not a candidate for CABG.   Inpatient Medications    Scheduled Meds: . amLODipine  10 mg Oral Daily  . aspirin EC  81 mg Oral Daily  . atorvastatin  40 mg Oral q1800  . carvedilol  12.5 mg Oral BID WC  . COVID-19 mRNA Vac-TriS ([employer])  0.3 mL Intramuscular Once  . insulin aspart  0-15 Units Subcutaneous TID WC  . insulin aspart  4 Units Subcutaneous TID WC  . insulin glargine  20 Units Subcutaneous QHS  . pantoprazole  40 mg Oral Daily  . sodium chloride flush  3 mL Intravenous Q12H  . ticagrelor  180 mg Oral Once   Followed by  . [START ON 06/11/2020] ticagrelor  90 mg Oral BID   Continuous Infusions: . sodium chloride    . heparin 900 Units/hr (06/10/20 1429)   PRN Meds: sodium chloride, acetaminophen, albuterol, dextrose, nitroGLYCERIN, ondansetron (ZOFRAN) IV, polyethylene glycol, senna, sodium chloride flush   Vital Signs    Vitals:   06/09/20 2035 06/10/20 0353 06/10/20 0825 06/10/20 1007  BP: 128/68 139/75    Pulse: 76 77    Resp: 18 19    Temp: 98.6 F (37 C) 98.4 F (36.9 C) 98.3 F (36.8 C)   TempSrc: Oral Oral Oral   SpO2: 100% 99%    Weight:    77.7 kg  Height:        Intake/Output Summary (Last 24 hours) at 06/10/2020 1531 Last data filed at 06/10/2020 [id number] Gross per 24 hour  Intake 1497.43 ml  Output 2450 ml  Net -952.57 ml   Last 3 Weights 06/10/2020 06/09/2020 06/08/2020  Weight (lbs) 171 lb 4.8 oz 171 lb 11.8 oz 170 lb 3.1 oz  Weight (kg) 77.7 kg 77.9 kg 77.2 kg      Telemetry    NSR- Personally Reviewed  ECG    NSR, LBBB - Personally Reviewed  Physical Exam   GEN: No acute distress.   Neck: No JVD Cardiac: RRR, no murmurs, rubs, or gallops.   Respiratory: Clear to auscultation bilaterally. GI: Soft, nontender, non-distended  MS: No edema; No deformity. Neuro:  Nonfocal  Psych: Normal affect   Labs    High Sensitivity Troponin:   Recent Labs  Lab 06/06/20 0227 06/06/20 0455  TROPONINIHS 2,410* 2,238*      Chemistry Recent Labs  Lab 06/06/20 0227 06/06/20 0258 06/09/20 0320 06/09/20 1042 06/09/20 1048 06/09/20 1511 06/10/20 0324  NA 128*   < > 138   < > 144  144 139 141  K 4.7   < > 3.8   < > 3.5  3.5 4.0 4.0  CL 96*   < > 107  --   --  110 110  CO2 18*   < > 22  --   --  20* 23  GLUCOSE 661*   < > 401*  --   --  203* 360*  BUN 32*   < > 15  --   --  12 14  CREATININE 2.48*   < > 1.76*  --   --  1.68* 1.70*  CALCIUM 8.3*   < > 8.6*  --   --  8.3* 8.6*  PROT 6.2*  --   --   --   --   --   --   ALBUMIN 2.7*  --   --   --   --   --   --   AST 19  --   --   --   --   --   --   ALT 14  --   --   --   --   --   --   ALKPHOS 80  --   --   --   --   --   --   BILITOT 1.2  --   --   --   --   --   --   GFRNONAA 21*   < > 32*  --   --  34* 33*  ANIONGAP 14   < > 9  --   --  9 8   < > = values in this interval not displayed.     Hematology Recent Labs  Lab 06/08/20 0228 06/09/20 0320 06/09/20 1042 06/09/20 1048 06/10/20 0324  WBC 6.8 5.7  --   --  5.6  RBC 3.88 3.79*  --   --  3.68*  HGB 11.2* 11.1* 11.2* 10.5*  10.9* 10.6*  HCT 34.9* 33.6* 33.0* 31.0*  32.0* 33.9*  MCV 89.9 88.7  --   --  92.1  MCH 28.9 29.3  --   --  28.8  MCHC 32.1 33.0  --   --  31.3  RDW 15.2 15.4  --   --  15.7*  PLT 162 166  --   --  176    BNP Recent Labs  Lab 06/06/20 0228  BNP 953.9*     DDimer No results for input(s): DDIMER in the last 168 hours.   Radiology    CARDIAC CATHETERIZATION  Result Date: 06/09/2020  Severe diffuse three-vessel coronary artery disease.  40 to 50% ostial LAD, 85% mid LAD.  Severe obstruction first obtuse marginal/ramus intermedius.  85% proximal to mid circumflex stenosis.   Severe diffuse disease in each of 3 obtuse marginal branches.  Total occlusion of the proximal to mid RCA.  LVEDP is normal.  Left ventriculography was not performed because of renal insufficiency. RECOMMENDATIONS:  Consider multivessel coronary bypass grafting given diabetes, relatively young age, and LV dysfunction.  Discussed with Dr. Illene Bolus.   Cardiac Studies   diagnostic cath reviewed  Patient Profile     65 y.o. female with NSTEMI in the setting of DKA  Assessment & Plan    Worsening LAD disease compared to prior cath.  THis is the only lesion amenable to PCI.  Turned down for CABG. Given her low EF, this may be beneficial to help improve LVEF.  Discussed possibility of PCI of LAD with patient.  SHe is agreeable.  WIll also discuss with her regular cardiologist.  Loaded with Atlas.  OK to stop heparin.      For questions or updates, please contact [place] Please consult [email or web address] for contact info under        Signed, Larae Grooms, MD  06/10/2020, 3:31 PM

## 2020-06-10 NOTE — Progress Notes (Signed)
[place] for Heparin Indication: chest pain/ACS  Allergies  Allergen Reactions  . Lisinopril Cough  . Plavix [Clopidogrel Bisulfate] Rash    Patient Measurements: Height: '5\' 3"'$  (160 cm) Weight: 77.9 kg (171 lb 11.8 oz) IBW/kg (Calculated) : 52.4 Heparin Dosing Weight: 69 kg  Vital Signs: Temp: 98.4 F (36.9 C) (02/09 0353) Temp Source: Oral (02/09 0353) BP: 139/75 (02/09 0353) Pulse Rate: 77 (02/09 0353)  Labs: Recent Labs    06/08/20 0228 06/08/20 2058 06/09/20 0320 06/09/20 1042 06/09/20 1048 06/09/20 1511 06/10/20 0324  HGB 11.2*  --  11.1* 11.2* 10.5*  10.9*  --   --   HCT 34.9*  --  33.6* 33.0* 31.0*  32.0*  --   --   PLT 162  --  166  --   --   --   --   LABPROT  --  12.7  --   --   --   --   --   INR  --  1.0  --   --   --   --   --   HEPARINUNFRC 0.49  --  0.41  --   --   --  0.50  CREATININE 1.91*  --  1.76*  --   --  1.68* 1.70*    Estimated Creatinine Clearance: 33.5 mL/min (A) (by C-G formula based on SCr of 1.7 mg/dL (H)).    Assessment: 65 y.o. F presents with CP on heparin for NSTEMI No AC PTA. CBC ok at baseline. Plans noted for cath today 06/09/20.  Heparin level this AM was therapeutic today on heparin 900 units/hr.   Heparin drip stopped this AM prior to cardiac cath.  Now s/p cath> severe 3 vessel CAD.  Pharmacy consulted to restart IV heparin drip 8 hours post sheath removal.  Heparin level therapeutic (0.5) on gtt at 900 units/hr. No bleeding noted.    Goal of Therapy:  Heparin level 0.3-0.7 units/ml Monitor platelets by anticoagulation protocol: Yes   Plan:  Continue Heparin drip at 900 units/hr  F/u daily heparin level and CBC  Sherlon Handing, PharmD, BCPS Please see amion for complete clinical pharmacist phone list 06/10/2020 4:24 AM

## 2020-06-10 NOTE — Progress Notes (Signed)
Occupational Therapy Treatment Patient Details Name: [name] MRN: [id number] DOB: 1955-11-25 Today's Date: 06/10/2020    History of present illness Pt is a 66 y.o. female who presents with mild diabetic ketoacidosis with progressive weakness over past week with confusion. She was also found to have chest pain and elevated troponin, likely a type II NSTEMI in the setting of hypotension and SKA with known ischemic heart disease. PMH: DM2, HFrecEF, CAD, COPD, HTN, CKD IIIb, MI, arthritis, and PVD.   OT comments  Pt performing own ADL and mobility with increased time, but pt reports this is her normal. Energy conservation strategy education completed and handout provided. Pt increasing ability to perform OOB ADL and bed mobility without as much physical assist. Pt tolerating session well, but was not able to stay awake x10 mins for OOB ADL tasks so pt will remain on caseload. Pt would benefit from continued OT skilled services. OT following acutely.   Follow Up Recommendations  Home health OT;Supervision - Intermittent    Equipment Recommendations  3 in 1 bedside commode    Recommendations for Other Services      Precautions / Restrictions Precautions Precautions: Fall Restrictions Weight Bearing Restrictions: No       Mobility Bed Mobility Overal bed mobility: Needs Assistance       Supine to sit: Supervision Sit to supine: Supervision   General bed mobility comments: supervisionA for use of rail  Transfers Overall transfer level: Needs assistance Equipment used: Rolling walker (2 wheeled) Transfers: Sit to/from Stand Sit to Stand: Supervision              Balance Overall balance assessment: Mild deficits observed, not formally tested                                         ADL either performed or assessed with clinical judgement   ADL Overall ADL's : Modified independent;At baseline     Grooming: Set up;Sitting                                Functional mobility during ADLs: Supervision/safety General ADL Comments: Pt performing own ADL and mobility with increased time, but pt reports this is her normal. Energy conservations strategy education completed. Pt tolerating session well, but was not able to stay awake x10 mins for OOB ADL tasks so pt will remain on caseload.     Vision       Perception     Praxis      Cognition Arousal/Alertness: Awake/alert Behavior During Therapy: WFL for tasks assessed/performed Overall Cognitive Status: Within Functional Limits for tasks assessed                                 General Comments: A&Ox4.        Exercises     Shoulder Instructions       General Comments HR below 80 BPM with exertion    Pertinent Vitals/ Pain       Pain Assessment: No/denies pain  Home Living  Prior Functioning/Environment              Frequency  Min 2X/week        Progress Toward Goals  OT Goals(current goals can now be found in the care plan section)  Progress towards OT goals: Progressing toward goals  Acute Rehab OT Goals Patient Stated Goal: have CABG to be able to "watch her grands." OT Goal Formulation: With patient Time For Goal Achievement: 06/22/20 Potential to Achieve Goals: Good ADL Goals Pt Will Perform Tub/Shower Transfer: with supervision;Shower transfer;ambulating Additional ADL Goal #1: Pt will perform x10 mins of OOB ADL with 1 seated rest break in order to increase activity tolerance. Additional ADL Goal #2: Pt will utilize and state 3 energy conservation strategies in order to increase independence with ADL and mobility.  Plan All goals met and education completed, patient discharged from OT services    Co-evaluation                 AM-PAC OT "6 Clicks" Daily Activity     Outcome Measure   Help from another person eating meals?: None Help from another  person taking care of personal grooming?: None Help from another person toileting, which includes using toliet, bedpan, or urinal?: A Little Help from another person bathing (including washing, rinsing, drying)?: A Little Help from another person to put on and taking off regular upper body clothing?: None Help from another person to put on and taking off regular lower body clothing?: A Little 6 Click Score: 21    End of Session    OT Visit Diagnosis: Unsteadiness on feet (R26.81)   Activity Tolerance Patient tolerated treatment well   Patient Left in bed;with call bell/phone within reach   Nurse Communication Mobility status        Time: 1005-1020 OT Time Calculation (min): 15 min  Charges: OT General Charges $OT Visit: 1 Visit OT Treatments $Self Care/Home Management : 8-22 mins  Jefferey Pica, OTR/L Acute Rehabilitation Services Pager: [phone] Office: [phone]    Nelda Luckey C 06/10/2020, 5:48 PM

## 2020-06-11 ENCOUNTER — Encounter (HOSPITAL_COMMUNITY)
Admission: EM | Disposition: A | Discharge status: Home Health | Payer: Self-pay | Source: Home / Self Care | Attending: Internal Medicine

## 2020-06-11 ENCOUNTER — Encounter (HOSPITAL_COMMUNITY): Payer: Self-pay | Admitting: Interventional Cardiology

## 2020-06-11 DIAGNOSIS — N1831 Chronic kidney disease, stage 3a: Secondary | ICD-10-CM | POA: Diagnosis not present

## 2020-06-11 DIAGNOSIS — K2100 Gastro-esophageal reflux disease with esophagitis, without bleeding: Secondary | ICD-10-CM | POA: Diagnosis not present

## 2020-06-11 DIAGNOSIS — K219 Gastro-esophageal reflux disease without esophagitis: Secondary | ICD-10-CM | POA: Diagnosis not present

## 2020-06-11 DIAGNOSIS — N179 Acute kidney failure, unspecified: Secondary | ICD-10-CM | POA: Diagnosis not present

## 2020-06-11 DIAGNOSIS — E1110 Type 2 diabetes mellitus with ketoacidosis without coma: Secondary | ICD-10-CM | POA: Diagnosis not present

## 2020-06-11 DIAGNOSIS — E785 Hyperlipidemia, unspecified: Secondary | ICD-10-CM | POA: Diagnosis not present

## 2020-06-11 DIAGNOSIS — R739 Hyperglycemia, unspecified: Secondary | ICD-10-CM | POA: Diagnosis not present

## 2020-06-11 DIAGNOSIS — E1122 Type 2 diabetes mellitus with diabetic chronic kidney disease: Secondary | ICD-10-CM | POA: Diagnosis not present

## 2020-06-11 DIAGNOSIS — I428 Other cardiomyopathies: Secondary | ICD-10-CM | POA: Diagnosis not present

## 2020-06-11 DIAGNOSIS — I130 Hypertensive heart and chronic kidney disease with heart failure and stage 1 through stage 4 chronic kidney disease, or unspecified chronic kidney disease: Secondary | ICD-10-CM | POA: Diagnosis not present

## 2020-06-11 DIAGNOSIS — I214 Non-ST elevation (NSTEMI) myocardial infarction: Secondary | ICD-10-CM | POA: Diagnosis not present

## 2020-06-11 DIAGNOSIS — Z7982 Long term (current) use of aspirin: Secondary | ICD-10-CM | POA: Diagnosis not present

## 2020-06-11 DIAGNOSIS — Z7984 Long term (current) use of oral hypoglycemic drugs: Secondary | ICD-10-CM | POA: Diagnosis not present

## 2020-06-11 DIAGNOSIS — Z8616 Personal history of COVID-19: Secondary | ICD-10-CM | POA: Diagnosis not present

## 2020-06-11 DIAGNOSIS — I313 Pericardial effusion (noninflammatory): Secondary | ICD-10-CM | POA: Diagnosis not present

## 2020-06-11 DIAGNOSIS — E1310 Other specified diabetes mellitus with ketoacidosis without coma: Secondary | ICD-10-CM | POA: Diagnosis not present

## 2020-06-11 DIAGNOSIS — I5042 Chronic combined systolic (congestive) and diastolic (congestive) heart failure: Secondary | ICD-10-CM | POA: Diagnosis not present

## 2020-06-11 DIAGNOSIS — I2510 Atherosclerotic heart disease of native coronary artery without angina pectoris: Secondary | ICD-10-CM | POA: Diagnosis not present

## 2020-06-11 DIAGNOSIS — N178 Other acute kidney failure: Secondary | ICD-10-CM | POA: Diagnosis not present

## 2020-06-11 DIAGNOSIS — I5023 Acute on chronic systolic (congestive) heart failure: Secondary | ICD-10-CM | POA: Diagnosis not present

## 2020-06-11 HISTORY — PX: CORONARY STENT INTERVENTION: CATH118234

## 2020-06-11 LAB — CBC
HCT: 33.5 % — ABNORMAL LOW (ref 36.0–46.0)
Hemoglobin: 10.9 g/dL — ABNORMAL LOW (ref 12.0–15.0)
MCH: 29.4 pg (ref 26.0–34.0)
MCHC: 32.5 g/dL (ref 30.0–36.0)
MCV: 90.3 fL (ref 80.0–100.0)
Platelets: 181 10*3/uL (ref 150–400)
RBC: 3.71 MIL/uL — ABNORMAL LOW (ref 3.87–5.11)
RDW: 15.8 % — ABNORMAL HIGH (ref 11.5–15.5)
WBC: 6.3 10*3/uL (ref 4.0–10.5)
nRBC: 0 % (ref 0.0–0.2)

## 2020-06-11 LAB — BASIC METABOLIC PANEL
Anion gap: 9 (ref 5–15)
BUN: 11 mg/dL (ref 8–23)
CO2: 23 mmol/L (ref 22–32)
Calcium: 8.8 mg/dL — ABNORMAL LOW (ref 8.9–10.3)
Chloride: 109 mmol/L (ref 98–111)
Creatinine, Ser: 1.45 mg/dL — ABNORMAL HIGH (ref 0.44–1.00)
GFR, Estimated: 41 mL/min — ABNORMAL LOW (ref >60)
Glucose, Bld: 117 mg/dL — ABNORMAL HIGH (ref 70–99)
Potassium: 3.8 mmol/L (ref 3.5–5.1)
Sodium: 141 mmol/L (ref 135–145)

## 2020-06-11 LAB — CULTURE, BLOOD (ROUTINE X 2)
Culture: NO GROWTH
Special Requests: ADEQUATE

## 2020-06-11 LAB — GLUCOSE, CAPILLARY
Glucose-Capillary: 123 mg/dL — ABNORMAL HIGH (ref 70–99)
Glucose-Capillary: 100 mg/dL — ABNORMAL HIGH (ref 70–99)
Glucose-Capillary: 217 mg/dL — ABNORMAL HIGH (ref 70–99)
Glucose-Capillary: 277 mg/dL — ABNORMAL HIGH (ref 70–99)

## 2020-06-11 LAB — POCT ACTIVATED CLOTTING TIME
Activated Clotting Time: 279 seconds
Activated Clotting Time: 345 seconds

## 2020-06-11 SURGERY — CORONARY STENT INTERVENTION
Anesthesia: LOCAL

## 2020-06-11 MED ORDER — ASPIRIN 81 MG PO CHEW
81.0000 mg | CHEWABLE_TABLET | ORAL | Status: AC
  Administered 2020-06-11: 81 mg via ORAL
  Filled 2020-06-11: qty 1

## 2020-06-11 MED ORDER — IOHEXOL 350 MG/ML SOLN
INTRAVENOUS | Status: AC
  Filled 2020-06-11: qty 1

## 2020-06-11 MED ORDER — HYDRALAZINE HCL 20 MG/ML IJ SOLN: 10.0000 mg | INTRAMUSCULAR | Status: AC | PRN

## 2020-06-11 MED ORDER — HEPARIN SODIUM (PORCINE) 1000 UNIT/ML IJ SOLN
INTRAMUSCULAR | Status: AC
  Filled 2020-06-11: qty 1

## 2020-06-11 MED ORDER — LABETALOL HCL 5 MG/ML IV SOLN: 10.0000 mg | INTRAVENOUS | Status: AC | PRN

## 2020-06-11 MED ORDER — SODIUM CHLORIDE 0.9% FLUSH: 3.0000 mL | INTRAVENOUS | Status: DC | PRN

## 2020-06-11 MED ORDER — MIDAZOLAM HCL 2 MG/2ML IJ SOLN
INTRAMUSCULAR | Status: AC
  Filled 2020-06-11: qty 2

## 2020-06-11 MED ORDER — LIDOCAINE HCL (PF) 1 % IJ SOLN
INTRAMUSCULAR | Status: AC
  Filled 2020-06-11: qty 30

## 2020-06-11 MED ORDER — MIDAZOLAM HCL 2 MG/2ML IJ SOLN
INTRAMUSCULAR | Status: DC | PRN
  Administered 2020-06-11: 1 mg via INTRAVENOUS

## 2020-06-11 MED ORDER — VERAPAMIL HCL 2.5 MG/ML IV SOLN
INTRAVENOUS | Status: DC | PRN
  Administered 2020-06-11: 10 mL via INTRA_ARTERIAL

## 2020-06-11 MED ORDER — HEPARIN (PORCINE) IN NACL 1000-0.9 UT/500ML-% IV SOLN
INTRAVENOUS | Status: AC
  Filled 2020-06-11: qty 500

## 2020-06-11 MED ORDER — SODIUM CHLORIDE 0.9% FLUSH
3.0000 mL | Freq: Two times a day (BID) | INTRAVENOUS | Status: DC
  Administered 2020-06-12: 3 mL via INTRAVENOUS

## 2020-06-11 MED ORDER — TICAGRELOR 90 MG PO TABS: 90.0000 mg | ORAL_TABLET | Freq: Two times a day (BID) | ORAL | Status: DC

## 2020-06-11 MED ORDER — ASPIRIN 81 MG PO CHEW: 81.0000 mg | CHEWABLE_TABLET | Freq: Every day | ORAL | Status: DC

## 2020-06-11 MED ORDER — ONDANSETRON HCL 4 MG/2ML IJ SOLN: 4.0000 mg | Freq: Four times a day (QID) | INTRAMUSCULAR | Status: DC | PRN

## 2020-06-11 MED ORDER — HEPARIN SODIUM (PORCINE) 5000 UNIT/ML IJ SOLN
5000.0000 [IU] | Freq: Three times a day (TID) | INTRAMUSCULAR | Status: DC
  Administered 2020-06-11 – 2020-06-12 (×2): 5000 [IU] via SUBCUTANEOUS
  Filled 2020-06-11 (×2): qty 1

## 2020-06-11 MED ORDER — HEPARIN (PORCINE) IN NACL 1000-0.9 UT/500ML-% IV SOLN
INTRAVENOUS | Status: AC
  Filled 2020-06-11: qty 1000

## 2020-06-11 MED ORDER — NITROGLYCERIN 1 MG/10 ML FOR IR/CATH LAB
INTRA_ARTERIAL | Status: AC
  Filled 2020-06-11: qty 10

## 2020-06-11 MED ORDER — SODIUM CHLORIDE 0.9 % IV SOLN: 250.0000 mL | INTRAVENOUS | Status: DC | PRN

## 2020-06-11 MED ORDER — LIDOCAINE HCL (PF) 1 % IJ SOLN
INTRAMUSCULAR | Status: DC | PRN
  Administered 2020-06-11: 2 mL

## 2020-06-11 MED ORDER — IOHEXOL 350 MG/ML SOLN
INTRAVENOUS | Status: DC | PRN
  Administered 2020-06-11: 70 mL

## 2020-06-11 MED ORDER — ACETAMINOPHEN 325 MG PO TABS
650.0000 mg | ORAL_TABLET | ORAL | Status: DC | PRN
  Administered 2020-06-11: 650 mg via ORAL

## 2020-06-11 MED ORDER — SODIUM CHLORIDE 0.9 % IV SOLN: INTRAVENOUS | Status: DC

## 2020-06-11 MED ORDER — OXYCODONE HCL 5 MG PO TABS
5.0000 mg | ORAL_TABLET | ORAL | Status: DC | PRN
Start: 2020-06-11 — End: 2020-06-12

## 2020-06-11 MED ORDER — VERAPAMIL HCL 2.5 MG/ML IV SOLN
INTRAVENOUS | Status: AC
  Filled 2020-06-11: qty 2

## 2020-06-11 MED ORDER — SODIUM CHLORIDE 0.9 % IV SOLN: INTRAVENOUS | Status: AC

## 2020-06-11 MED ORDER — HEPARIN (PORCINE) IN NACL 1000-0.9 UT/500ML-% IV SOLN
INTRAVENOUS | Status: DC | PRN
  Administered 2020-06-11 (×3): 500 mL

## 2020-06-11 MED ORDER — FENTANYL CITRATE (PF) 100 MCG/2ML IJ SOLN
INTRAMUSCULAR | Status: AC
  Filled 2020-06-11: qty 2

## 2020-06-11 MED ORDER — HEPARIN SODIUM (PORCINE) 1000 UNIT/ML IJ SOLN
INTRAMUSCULAR | Status: DC | PRN
  Administered 2020-06-11: 10000 [IU] via INTRAVENOUS

## 2020-06-11 MED ORDER — FENTANYL CITRATE (PF) 100 MCG/2ML IJ SOLN
INTRAMUSCULAR | Status: DC | PRN
  Administered 2020-06-11: 25 ug via INTRAVENOUS

## 2020-06-11 MED ORDER — NITROGLYCERIN 1 MG/10 ML FOR IR/CATH LAB
INTRA_ARTERIAL | Status: DC | PRN
  Administered 2020-06-11: 200 ug

## 2020-06-11 MED ORDER — ACETAMINOPHEN 325 MG PO TABS
ORAL_TABLET | ORAL | Status: AC
  Filled 2020-06-11: qty 2

## 2020-06-11 MED ORDER — SODIUM CHLORIDE 0.9% FLUSH: 3.0000 mL | Freq: Two times a day (BID) | INTRAVENOUS | Status: DC

## 2020-06-11 MED ORDER — LABETALOL HCL 5 MG/ML IV SOLN: 10.0000 mg | INTRAVENOUS | Status: DC | PRN

## 2020-06-11 SURGICAL SUPPLY — 19 items
BAG SNAP BAND KOVER 36X36 (MISCELLANEOUS) ×1 IMPLANT
BALLN EMERGE MR 2.25X12 (BALLOONS) ×2
BALLN ~~LOC~~ EMERGE MR 2.75X12 (BALLOONS) ×2
BALLOON EMERGE MR 2.25X12 (BALLOONS) IMPLANT
BALLOON ~~LOC~~ EMERGE MR 2.75X12 (BALLOONS) IMPLANT
CATH VISTA GUIDE 6FR XBLAD3.5 (CATHETERS) ×1 IMPLANT
COVER DOME SNAP 22 D (MISCELLANEOUS) ×1 IMPLANT
DEVICE RAD TR BAND REGULAR (VASCULAR PRODUCTS) ×1 IMPLANT
GLIDESHEATH SLEND A-KIT 6F 22G (SHEATH) ×1 IMPLANT
GUIDEWIRE INQWIRE 1.5J.035X260 (WIRE) IMPLANT
INQWIRE 1.5J .035X260CM (WIRE) ×2
KIT ENCORE 26 ADVANTAGE (KITS) ×1 IMPLANT
KIT HEART LEFT (KITS) ×2 IMPLANT
PACK CARDIAC CATHETERIZATION (CUSTOM PROCEDURE TRAY) ×2 IMPLANT
SHEATH PROBE COVER 6X72 (BAG) ×1 IMPLANT
STENT RESOLUTE ONYX 2.5X15 (Permanent Stent) ×1 IMPLANT
TRANSDUCER W/STOPCOCK (MISCELLANEOUS) ×2 IMPLANT
TUBING CIL FLEX 10 FLL-RA (TUBING) ×2 IMPLANT
WIRE ASAHI PROWATER 180CM (WIRE) ×1 IMPLANT

## 2020-06-11 NOTE — TOC Benefit Eligibility Note (Signed)
Patient Advocate Encounter  Prior Authorization for Entresto 24-26 mg  has been approved.    PA# [id number] Effective dates: 06/11/20 through 06/11/2021  Patients co-pay is $3.00.     Lyndel Safe, CPhT Certified Pharmacy Technician- Transitions of [place] Antimicrobial Stewardship Team Direct Number: [phone]  Fax: [phone]

## 2020-06-11 NOTE — Discharge Instructions (Signed)
Radial Site Care Refer to this sheet in the next few weeks. These instructions provide you with information on caring for yourself after your procedure. Your caregiver may also give you more specific instructions. Your treatment has been planned according to current medical practices, but problems sometimes occur. Call your caregiver if you have any problems or questions after your procedure. HOME CARE INSTRUCTIONS  You may shower the day after the procedure.Remove the bandage (dressing) and gently wash the site with plain soap and water.Gently pat the site dry.   Do not apply powder or lotion to the site.   Do not submerge the affected site in water for 3 to 5 days.   Inspect the site at least twice daily.   Do not flex or bend the affected arm for 24 hours.   No lifting over 5 pounds (2.3 kg) for 5 days after your procedure.   Do not drive home if you are discharged the same day of the procedure. Have someone else drive you.   You may drive 24 hours after the procedure unless otherwise instructed by your caregiver.  What to expect:  Any bruising will usually fade within 1 to 2 weeks.   Blood that collects in the tissue (hematoma) may be painful to the touch. It should usually decrease in size and tenderness within 1 to 2 weeks.  SEEK IMMEDIATE MEDICAL CARE IF:  You have unusual pain at the radial site.   You have redness, warmth, swelling, or pain at the radial site.   You have drainage (other than a small amount of blood on the dressing).   You have chills.   You have a fever or persistent symptoms for more than 72 hours.   You have a fever and your symptoms suddenly get worse.   Your arm becomes pale, cool, tingly, or numb.   You have heavy bleeding from the site. Hold pressure on the site.   PLEASE DO NOT MISS ANY DOSES OF YOUR BRILINTA!!!!! Also keep a log of you blood pressures and bring back to your follow up appt. Please call the office with any questions.    Patients taking blood thinners should generally stay away from medicines like ibuprofen, Advil, Motrin, naproxen, and Aleve due to risk of stomach bleeding. You may take Tylenol as directed or talk to your primary doctor about alternatives.    PLEASE ENSURE THAT YOU DO NOT RUN OUT OF YOUR BRILINTA. This medication is very important to remain on for at least one year. IF you have issues obtaining this medication due to cost please CALL the office 3-5 business days prior to running out in order to prevent missing doses of this medication.

## 2020-06-11 NOTE — Progress Notes (Deleted)
Pharmacy note: [name] was submitted for prior authorization and was approved.  Cost with insurance should be $3 for a 30 day supply  Hildred Laser, PharmD Clinical Pharmacist **Pharmacist phone directory can now be found on [email or web address] ([email or web address]).  Listed under [place].

## 2020-06-11 NOTE — Discharge Summary (Signed)
Name: [name] MRN: [id number] DOB: 1955/12/30 65 y.o. PCP: Marty Heck, DO  Date of Admission: 06/06/2020  2:09 AM Date of Discharge:  06/12/20  Attending Physician: Aldine Contes, MD  Discharge Diagnosis: 1. NSTEMI 2. DKA 3. Hyperglycemia 4. AKI on CKD stage IIIa 5. HFrEF  Discharge Medications: Allergies as of 06/12/2020      Reactions   Lisinopril Cough   Plavix [clopidogrel Bisulfate] Rash      Medication List    STOP taking these medications   amLODipine 5 MG tablet Commonly known as: NORVASC   empagliflozin 25 MG Tabs tablet Commonly known as: Jardiance     TAKE these medications   Accu-Chek FastClix Lancets Misc Check blood sugar up to  3x/day before meals   Accu-Chek Guide test strip Generic drug: glucose blood CHECK BLOOD SUGAR UP TO 3X/DAY BEFORE MEALS   Accu-Chek Guide w/Device Kit 1 each by Does not apply route 3 (three) times daily.   acetaminophen 500 MG tablet Commonly known as: TYLENOL Take 1,000 mg by mouth every 6 (six) hours as needed for mild pain, fever or headache.   albuterol 108 (90 Base) MCG/ACT inhaler Commonly known as: Proventil HFA Inhale 2 puffs into the lungs every 6 (six) hours as needed for wheezing or shortness of breath.   aspirin EC 81 MG tablet Take 1 tablet (81 mg total) by mouth daily. IM program, [facility]   atorvastatin 80 MG tablet Commonly known as: LIPITOR Take 1 tablet (80 mg total) by mouth daily at 6 PM. What changed:   medication strength  how much to take  when to take this   blood glucose meter kit and supplies Kit Dispense based on patient and insurance preference. Use up to four times daily as directed. (FOR ICD-9 250.00, 250.01).   blood glucose meter kit and supplies Kit Dispense based on patient and insurance preference. Use up to four times daily as directed. (FOR ICD-9 250.00, 250.01).   carvedilol 12.5 MG tablet Commonly known as: COREG TAKE 1 TABLET (12.5 MG TOTAL) BY  MOUTH 2 (TWO) TIMES DAILY WITH A MEAL.   furosemide 20 MG tablet Commonly known as: LASIX Take 1 tablet (20 mg total) by mouth daily. What changed:   when to take this  reasons to take this   insulin glargine 100 UNIT/ML Solostar Pen Commonly known as: LANTUS Inject 20 Units into the skin daily.   losartan 100 MG tablet Commonly known as: COZAAR Take 1 tablet (100 mg total) by mouth daily.   metFORMIN 500 MG 24 hr tablet Commonly known as: GLUCOPHAGE-XR Take 4 tablets (2,000 mg total) by mouth daily with breakfast. What changed:   how much to take  when to take this   Multivitamin Gummies Adult Chew Chew 2 tablets by mouth daily.   nitroGLYCERIN 0.4 MG SL tablet Commonly known as: NITROSTAT Place 1 tablet (0.4 mg total) under the tongue every 5 (five) minutes as needed for chest pain.   omeprazole 20 MG capsule Commonly known as: PRILOSEC TAKE 1 CAPSULE (20 MG TOTAL) BY MOUTH DAILY.   ticagrelor 90 MG Tabs tablet Commonly known as: BRILINTA Take 1 tablet (90 mg total) by mouth 2 (two) times daily.   tiZANidine 4 MG tablet Commonly known as: Zanaflex Take 1 tablet (4 mg total) by mouth every 6 (six) hours as needed for muscle spasms.   Trulicity 4.09 WJ/1.9JY Sopn Generic drug: Dulaglutide Inject 0.75 mg into the skin once a week.  Unifine Pentips 31G X 5 MM Misc Generic drug: Insulin Pen Needle USE TO INJECT 20 UNITS DAILY What changed: Another medication with the same name was added. Make sure you understand how and when to take each.   Insupen Pen Needles 32G X 4 MM Misc Generic drug: Insulin Pen Needle 1 Units by Does not apply route daily. What changed: You were already taking a medication with the same name, and this prescription was added. Make sure you understand how and when to take each.   Vitamin D3 125 MCG (5000 UT) Caps Take 5,000 Units by mouth daily.            Durable Medical Equipment  (From admission, onward)         Start      Ordered   06/12/20 1313  DME 3-in-1  Once        06/12/20 1320          Disposition and follow-up:   [name] was discharged from [facility] in Stable condition.  At the hospital follow up visit please address:  1.  NSTEMI/HFREF: Will have follow with cardiology and cardiac rehabilitation, consider starting on entresto, amlodipine stopped in hospital. Not deemed good candidate for surgery, may benefit from CABG in future on diabetes is better controlled and she is feeling better. Monitor for bleeding on dual antiplatelet therapy and adjust medication as necessary.  Diabetes/DKA A1c of >17. Assess compliance of medication. Discharge on lantus 20 units and home metformin and trulicity. Stopped Jardiance given DKA and increased risk of euglycemic.  2.  Labs / imaging needed at time of follow-up: BMP, CBC, CBG  3.  Pending labs/ test needing follow-up: none  Follow-up Appointments:  Follow-up Information    [facility] Follow up on 06/17/2020.   Specialty: Cardiology Why: Please come in for follow up labs.  Contact information: [address] [place] [phone]       Deberah Pelton, NP Follow up on 06/23/2020.   Specialty: Cardiology Why: at 9:15am for your follow up appt. This at our [facility] office in order to schedule within appropriate time period. Contact information: [address] [place] [address] [place]. Schedule an appointment as soon as possible for a visit.               Hospital Course by problem list:  [name] is a 65 y/o female with history of type II DM, HFrecEF, CAD, COPD, HTN, CKD IIIb and PVD who presents with one week of intermittent chest pain admitted for DKA and NSETMI found to have reduced EF of 25-30% and diffuse CAD s/p PCI with DES in LAD on 06/11/2020.  # NSTEMI  # HFrEF # CAD s/p DES  # Aortic  Regurgitation Patient presents with worsening anginal symptoms over the past couple of weeks. Troponins 2410 > 2238, BNP 953.9, LA 2.2 > 3.9. Patient has history of DM with multi-vessel obstructive CAD who has been followed for some time due to potential need for CABG. Started on heparin drip for NSTEMI in setting of dehydration and and DKA from medication noncompliance. Found to have reduced EF 25-30% compared to 30-35% in 2019 and redemonstraotion of mild aortic regurgitation on ECHO. Cath with severe triple vessel disease. TCTS evaluated and found not to be a candidate for CABG at this time given uncontrolled diabetes and persistent weakness from recent  COVID. May reconsider once COVID symptoms improve and diabetes more controlled. Had PCI with DES placed in LAD on 2/10. Tolerated procedure well. Started on Brilinta in addition to ASA. Home Jardiance was stopped due to increased risk of euglycemic DKA. Amlodipine stopped but BP still somewhat soft in 110 on day os discharge and entresto was not started. Consider starting as a outpatient. Will need follow up with cardiology and cardiac rehab.  # DKA  # Hypotension Initial glucose 661, anion gap 14, CO2 18, BHB 2.13. Endorses high sugars over the past week, likely in the setting of NSTEMI complicated by medication non-compliance. Prescribed Metformin, Jardiance and Trulicity at home. Blood pressures initially in the 16'X systolic. Likely due to dehydration in the setting of poor PO intake. Started on insulin gtt and IVF. Glucose improved. Transitioned to insulin 20 units daily. A1c elevated at >17. CBGs in 200 on day of discharge. Will discharge on 20 units, metformin, and trulicity. Jardiance stopped due to elevated risk of euglycemic DKA. Will need follow for medication adjustment and better glycemic control.  # AKI on CKD Creatinine 2.48 on admission, BUN 32, GFR 21, potassium 4.7. Baseline creatinine ~1.7. Likely prerenal in the setting of DKA and  decreased PO intake. Improved with IV fluids. Cr. 1.5 on day of discharge.  Subjective: This morning, Ms Corbin Hott was evaluated at bedside. She reports feeling alright this morning and has slightly more energy. No further bleeding from cath site. Patient reports that her son will come live with her vs she will go to [facility] for him to care for her.   Discharge Exam:   BP (!) 141/66    Pulse 81    Temp 99.1 F (37.3 C) (Oral)    Resp 19    Ht '5\' 3"'  (1.6 m)    Wt 79.9 kg    SpO2 98%    BMI 31.20 kg/m  Discharge exam:  Constitutional: Patient laying in bed, comfortable, in no acute distress Respiratory: Lungs clear to ascultation Cardiovascular: Regular rate and rhythm, no murmurs,  no lower extremity edema Abdomen: Soft, non tender, normal BS Skin: warm, dry, no further bleeding from catheter site on right wrist  Pertinent Labs, Studies, and Procedures:  DG Chest 1 View  Result Date: 06/06/2020 CLINICAL DATA:  Diabetic ketoacidosis, hypotension EXAM: CHEST  1 VIEW COMPARISON:  chest x-ray 04/12/2020 FINDINGS: The heart size and mediastinal contours are unchanged. Redemonstration of linear atelectasis versus scarring within the lingula. No focal consolidation. No pulmonary edema. No pleural effusion. No pneumothorax. No acute osseous abnormality. IMPRESSION: No active disease. Electronically Signed   By: Iven Finn M.D.   On: 06/06/2020 03:33   ECHOCARDIOGRAM COMPLETE  Result Date: 06/06/2020    ECHOCARDIOGRAM REPORT   Patient Name:   [name] Date of Exam: 06/06/2020 Medical Rec #:  [id number]      Height:       63.0 in Accession #:    [id number]     Weight:       180.1 lb Date of Birth:  01/16/1956     BSA:          1.849 m Patient Age:    55 years       BP:           128/68 mmHg Patient Gender: F              HR:           81 bpm. Exam Location:  Inpatient Procedure: 2D Echo, Cardiac Doppler and Color Doppler Indications:    NSTEMI I21.4  History:        Patient has prior history  of Echocardiogram examinations, most                 recent 09/13/2018. CHF, Previous Myocardial Infarction, COPD,                 Signs/Symptoms:Dyspnea; Risk Factors:Hypertension, Diabetes and                 Dyslipidemia. COVID-19. CKD.  Sonographer:    Jonelle Sidle Dance Referring Phys: [place]  1. When compared to the prior study from 09/13/2018 LVEF has decreased 55-60% to 25-30% with diffuse hypokinesis.  2. Left ventricular ejection fraction, by estimation, is 25 to 30%. The left ventricle has severely decreased function. The left ventricle demonstrates global hypokinesis. There is severe concentric left ventricular hypertrophy. Left ventricular diastolic parameters are consistent with Grade I diastolic dysfunction (impaired relaxation). Elevated left ventricular end-diastolic pressure.  3. Right ventricular systolic function is normal. The right ventricular size is normal.  4. Left atrial size was mildly dilated.  5. The pericardial effusion is posterior to the left ventricle.  6. The mitral valve is normal in structure. Mild mitral valve regurgitation. No evidence of mitral stenosis.  7. The aortic valve is normal in structure. There is moderate calcification of the aortic valve. There is moderate thickening of the aortic valve. Aortic valve regurgitation is mild. Mild to moderate aortic valve sclerosis/calcification is present, without any evidence of aortic stenosis.  8. The inferior vena cava is normal in size with greater than 50% respiratory variability, suggesting right atrial pressure of 3 mmHg. FINDINGS  Left Ventricle: Left ventricular ejection fraction, by estimation, is 25 to 30%. The left ventricle has severely decreased function. The left ventricle demonstrates global hypokinesis. The left ventricular internal cavity size was normal in size. There is severe concentric left ventricular hypertrophy. Left ventricular diastolic parameters are consistent with Grade I diastolic  dysfunction (impaired relaxation). Elevated left ventricular end-diastolic pressure. Right Ventricle: The right ventricular size is normal. No increase in right ventricular wall thickness. Right ventricular systolic function is normal. Left Atrium: Left atrial size was mildly dilated. Right Atrium: Right atrial size was normal in size. Pericardium: Trivial pericardial effusion is present. The pericardial effusion is posterior to the left ventricle. Mitral Valve: The mitral valve is normal in structure. Mild mitral valve regurgitation. No evidence of mitral valve stenosis. Tricuspid Valve: The tricuspid valve is normal in structure. Tricuspid valve regurgitation is not demonstrated. No evidence of tricuspid stenosis. Aortic Valve: The aortic valve is normal in structure. There is moderate calcification of the aortic valve. There is moderate thickening of the aortic valve. Aortic valve regurgitation is mild. Aortic regurgitation PHT measures 325 msec. Mild to moderate  aortic valve sclerosis/calcification is present, without any evidence of aortic stenosis. Pulmonic Valve: The pulmonic valve was normal in structure. Pulmonic valve regurgitation is not visualized. No evidence of pulmonic stenosis. Aorta: The aortic root is normal in size and structure. Venous: The inferior vena cava is normal in size with greater than 50% respiratory variability, suggesting right atrial pressure of 3 mmHg. IAS/Shunts: No atrial level shunt detected by color flow Doppler.  LEFT VENTRICLE PLAX 2D LVIDd:         4.00 cm  Diastology LVIDs:         3.40 cm  LV e' medial:  4.03 cm/s LV PW:         1.60 cm  LV E/e' medial:  19.0 LV IVS:        1.50 cm  LV e' lateral:   4.47 cm/s LVOT diam:     1.80 cm  LV E/e' lateral: 17.1 LV SV:         53 LV SV Index:   29 LVOT Area:     2.54 cm  RIGHT VENTRICLE             IVC RV Basal diam:  2.30 cm     IVC diam: 2.20 cm RV S prime:     12.40 cm/s TAPSE (M-mode): 2.1 cm LEFT ATRIUM             Index        RIGHT ATRIUM           Index LA diam:        4.00 cm 2.16 cm/m  RA Area:     11.80 cm LA Vol (A2C):   51.5 ml 27.85 ml/m RA Volume:   24.30 ml  13.14 ml/m LA Vol (A4C):   65.7 ml 35.52 ml/m LA Biplane Vol: 58.1 ml 31.41 ml/m  AORTIC VALVE LVOT Vmax:   103.00 cm/s LVOT Vmean:  68.600 cm/s LVOT VTI:    0.209 m AI PHT:      325 msec  AORTA Ao Root diam: 3.10 cm Ao Asc diam:  3.20 cm MITRAL VALVE MV Area (PHT): 2.87 cm     SHUNTS MV Decel Time: 264 msec     Systemic VTI:  0.21 m MV E velocity: 76.40 cm/s   Systemic Diam: 1.80 cm MV A velocity: 123.00 cm/s MV E/A ratio:  0.62 Ena Dawley MD Electronically signed by Ena Dawley MD Signature Date/Time: 06/06/2020/1:52:05 PM    Final     Discharge Instructions: Discharge Instructions    (HEART FAILURE PATIENTS) Call MD:  Anytime you have any of the following symptoms: 1) 3 pound weight gain in 24 hours or 5 pounds in 1 week 2) shortness of breath, with or without a dry hacking cough 3) swelling in the hands, feet or stomach 4) if you have to sleep on extra pillows at night in order to breathe.   Complete by: As directed    Amb Referral to Cardiac Rehabilitation   Complete by: As directed    Diagnosis:  Coronary Stents NSTEMI     After initial evaluation and assessments completed: Virtual Based Care may be provided alone or in conjunction with Phase 2 Cardiac Rehab based on patient barriers.: Yes   Ambulatory referral to Physical Therapy   Complete by: As directed    Diet - low sodium heart healthy   Complete by: As directed    Diet Carb Modified   Complete by: As directed    Discharge instructions   Complete by: As directed    You were hospitalized for chest pain likely due to dehydration and stress from your blood sugar being elevated. On you cardiac catheterization we found several blockage of your heart and you had a stent placed. You were also found to have diabetic ketoacidosis during admission and placed on IV insulin. You glucose  is now looking better. It is important that you continue with your diabetic medications to prevent this for happening. Please follow up with cardiology and with cardiac rehabilitation clinic. Please also make a follow up appointment with your primary care doctor. Thank you for  allowing [place] to be part of your care.   Please start taking: Brilinta 90 mg twice daily Lantus 20 units at night  Metformin Trulicity Increase atovastatin to 80 mg daily  Stop taking: Amlodipine Jardiance   Please call our clinic if you have any questions or concerns, we may be able to help and keep you from a long and expensive emergency room wait. Our clinic and after hours phone number is [phone], the best time to call is Monday through Friday 9 am to 4 pm but there is always someone available 24/7 if you have an emergency. If you need medication refills please notify your pharmacy one week in advance and they will send [place] a request.   Increase activity slowly   Complete by: As directed       Signed: Iona Beard, MD 06/12/2020, 1:21 PM   Pager: [phone]

## 2020-06-11 NOTE — Progress Notes (Signed)
Progress Note  Patient Name: [name] Date of Encounter: 06/11/2020  [facility] Cardiologist: Sinclair Grooms, MD   Subjective   Feels well post PCI of LAD.  Inpatient Medications    Scheduled Meds: . [MAR Hold] amLODipine  10 mg Oral Daily  . [MAR Hold] aspirin EC  81 mg Oral Daily  . [MAR Hold] atorvastatin  40 mg Oral q1800  . [MAR Hold] carvedilol  12.5 mg Oral BID WC  . [MAR Hold] heparin injection (subcutaneous)  5,000 Units Subcutaneous Q8H  . [MAR Hold] insulin aspart  0-15 Units Subcutaneous TID WC  . [MAR Hold] insulin aspart  4 Units Subcutaneous TID WC  . [MAR Hold] insulin glargine  20 Units Subcutaneous QHS  . [MAR Hold] pantoprazole  40 mg Oral Daily  . [MAR Hold] sodium chloride flush  3 mL Intravenous Q12H  . [MAR Hold] sodium chloride flush  3 mL Intravenous Q12H  . [MAR Hold] ticagrelor  90 mg Oral BID   Continuous Infusions: . [MAR Hold] sodium chloride    . sodium chloride    . sodium chloride 10 mL/hr at 06/11/20 0546  . sodium chloride 100 mL/hr at 06/11/20 0835   PRN Meds: [MAR Hold] sodium chloride, sodium chloride, [MAR Hold] acetaminophen, [MAR Hold] albuterol, [MAR Hold] dextrose, [MAR Hold] nitroGLYCERIN, [MAR Hold] ondansetron (ZOFRAN) IV, [MAR Hold] oxyCODONE, [MAR Hold] polyethylene glycol, [MAR Hold] senna, [MAR Hold] sodium chloride flush, sodium chloride flush   Vital Signs    Vitals:   06/11/20 0806 06/11/20 0811 06/11/20 0827 06/11/20 1016  BP: 126/70 129/77 (!) 145/67 (!) 162/80  Pulse: 80 78 74 72  Resp: '18 15 18 17  '$ Temp:      TempSrc:      SpO2: 98% 99%    Weight:      Height:        Intake/Output Summary (Last 24 hours) at 06/11/2020 1046 Last data filed at 06/11/2020 0500 Gross per 24 hour  Intake 0 ml  Output 850 ml  Net -850 ml   Last 3 Weights 06/11/2020 06/10/2020 06/09/2020  Weight (lbs) 176 lb 2.4 oz 171 lb 4.8 oz 171 lb 11.8 oz  Weight (kg) 79.9 kg 77.7 kg 77.9 kg      Telemetry    NSR -  Personally Reviewed  ECG    NSR LBBB- Personally Reviewed  Physical Exam   GEN: No acute distress.   Neck: No JVD Cardiac: RRR, no murmurs, rubs, or gallops.  Respiratory: Clear to auscultation bilaterally. GI: Soft, nontender, non-distended  MS: No edema; No deformity. No right wrist hematoma Neuro:  Nonfocal  Psych: Normal affect   Labs    High Sensitivity Troponin:   Recent Labs  Lab 06/06/20 0227 06/06/20 0455  TROPONINIHS 2,410* 2,238*      Chemistry Recent Labs  Lab 06/06/20 0227 06/06/20 0258 06/09/20 1511 06/10/20 0324 06/11/20 0304  NA 128*   < > 139 141 141  K 4.7   < > 4.0 4.0 3.8  CL 96*   < > 110 110 109  CO2 18*   < > 20* 23 23  GLUCOSE 661*   < > 203* 360* 117*  BUN 32*   < > '12 14 11  '$ CREATININE 2.48*   < > 1.68* 1.70* 1.45*  CALCIUM 8.3*   < > 8.3* 8.6* 8.8*  PROT 6.2*  --   --   --   --   ALBUMIN 2.7*  --   --   --   --  AST 19  --   --   --   --   ALT 14  --   --   --   --   ALKPHOS 80  --   --   --   --   BILITOT 1.2  --   --   --   --   GFRNONAA 21*   < > 34* 33* 41*  ANIONGAP 14   < > '9 8 9   '$ < > = values in this interval not displayed.     Hematology Recent Labs  Lab 06/09/20 0320 06/09/20 1042 06/09/20 1048 06/10/20 0324 06/11/20 0304  WBC 5.7  --   --  5.6 6.3  RBC 3.79*  --   --  3.68* 3.71*  HGB 11.1*   < > 10.5*  10.9* 10.6* 10.9*  HCT 33.6*   < > 31.0*  32.0* 33.9* 33.5*  MCV 88.7  --   --  92.1 90.3  MCH 29.3  --   --  28.8 29.4  MCHC 33.0  --   --  31.3 32.5  RDW 15.4  --   --  15.7* 15.8*  PLT 166  --   --  176 181   < > = values in this interval not displayed.    BNP Recent Labs  Lab 06/06/20 0228  BNP 953.9*     DDimer No results for input(s): DDIMER in the last 168 hours.   Radiology    CARDIAC CATHETERIZATION  Result Date: 06/11/2020  80% eccentric mid LAD stenosis with TIMI grade III flow reduced to 0% with TIMI grade III flow using a 15 x 2.5 Onyx postdilated to 2.75 mm with high-pressure  [place] balloon.  No complications including absence of angina and hemodynamic disturbance during balloon inflations. RECOMMENDATIONS:  Aspirin and Brilinta for 12 months. After 12 months consider long-term P2 Y 12 inhibitor monotherapy either Plavix or reduced dose Brilinta.   Cardiac Studies   Cath results reviewed  Patient Profile     65 y.o. female with NSTEMI and low EF in the setting of DKA.   Assessment & Plan    1) CAD: DAPT with aspirin and Brilinta.  If there are bleeding issues, could stop aspirin after 1 month.  She was intolerant of plavix in the past.   2) LV dysfunction.  WIll see if she qualifies for [facility] given her low EF>  Was on Cozaar at home.  May be able to try [facility] now since her Cr is improving.  Continue beta blocker.  If Delene Loll will not be affordable for her, can go back to losartan.  3) CKD: AKI improving.    HTN: BP high now.  Has not had morning meds.       For questions or updates, please contact [place] Please consult [email or web address] for contact info under        Signed, Larae Grooms, MD  06/11/2020, 10:46 AM

## 2020-06-11 NOTE — Progress Notes (Addendum)
PT Cancellation Note  Patient Details Name: [name] MRN: [id number] DOB: 16-Nov-1955   Cancelled Treatment:    Reason Eval/Treat Not Completed: (P) Patient at procedure or test/unavailable Pt at cath lab for PCI of LAD (10am) Pt still has TR band on/bed rest (3:43 pm)  Will continue efforts per PT POC as schedule permits once medically appropriate.   Kara Pacer [name] 06/11/2020, 9:54 AM

## 2020-06-11 NOTE — CV Procedure (Signed)
   85% mid LAD stenosis with TIMI-3 flow reduced to 0% with TIMI grade III flow using a 2.5 Onyx postdilated at high pressure 2.75 mm in diameter.  4 pillar therapy for systolic heart failure: Beta-blocker, SGLT2, Entresto (currently on losartan), and if kidney function allows, MRA.  If LV EF remains low, consider resynchronization as she has left bundle branch block at approximately 150 ms duration.  Long-term dual antiplatelet therapy with eventual reduction to P2 Y 12 monotherapy.

## 2020-06-11 NOTE — Interval H&P Note (Signed)
History and Physical Interval Note:  06/11/2020 7:06 AM  I discussed the situation with the patient.  She has severe diffuse disease including a total right, diffuse diabetic circumflex disease, and a large wraparound LAD with diffuse disease but one particularly focal severe stenosis in the mid to distal vessel.  EF is less than 35%.  In speaking with Dr.Varanasi, we will proceed with PCI on this particular spot in the LAD.  Procedure will be high risk due to CKD, CHF, diffuse disease, and potential hemodynamic instability if prolonged ischemia or vessel closure.  Patient understands the high risk.  [name]  has presented today for surgery, with the diagnosis of cad.  The various methods of treatment have been discussed with the patient and family. After consideration of risks, benefits and other options for treatment, the patient has consented to  Procedure(s): CORONARY STENT INTERVENTION (N/A) as a surgical intervention.  The patient's history has been reviewed, patient examined, no change in status, stable for surgery.  I have reviewed the patient's chart and labs.  Questions were answered to the patient's satisfaction.     [name] III

## 2020-06-11 NOTE — Interval H&P Note (Signed)
Cath Lab Visit (complete for each Cath Lab visit)  Clinical Evaluation Leading to the Procedure:   ACS: Yes.    Non-ACS:    Anginal Classification: CCS III  Anti-ischemic medical therapy: Maximal Therapy (2 or more classes of medications)  Non-Invasive Test Results: No non-invasive testing performed  Prior CABG: No previous CABG      History and Physical Interval Note:  06/11/2020 7:04 AM  [name]  has presented today for surgery, with the diagnosis of cad.  The various methods of treatment have been discussed with the patient and family. After consideration of risks, benefits and other options for treatment, the patient has consented to  Procedure(s): CORONARY STENT INTERVENTION (N/A) as a surgical intervention.  The patient's history has been reviewed, patient examined, no change in status, stable for surgery.  I have reviewed the patient's chart and labs.  Questions were answered to the patient's satisfaction.     [name] III

## 2020-06-11 NOTE — Progress Notes (Signed)
CARDIAC REHAB PHASE I   Stent and MI education completed with pt. Pt educated on importance of ASA and Brilinta. Pt given MI book and stent card along with heart healthy and diabetic diets. Reviewed restrictions, site care, and exercise guidelines. Will refer to CRP II [place]. Pt drowsy. Call bell within reach.    [id number] Rufina Falco, RN BSN 06/11/2020 3:11 PM

## 2020-06-11 NOTE — Progress Notes (Addendum)
Two rings left on patient's right hand, advised and educated about possibly cutting off if needed due to post procedure swelling from TR band. Patient states understanding, agrees to cutting rings if necessary. Supervisor and Dr Tamala Julian made aware pre-procedure.

## 2020-06-11 NOTE — Progress Notes (Signed)
Heart Failure Stewardship Pharmacist Progress Note   PCP: Marty Heck, DO PCP-Cardiologist: Sinclair Grooms, MD    HPI:  65 yo F with PMH of T2DM, HFrEF, CAD, COPD, HTN, CKD IIIb, and PVD. She presented to the ED on 06/06/20 with chest pain and was admitted for NSTEMI. She was not taking any of her PTA medications regularly since her recent hospitalization for COVID-19 in December. She was ultimately also found to be in DKA and with an AKI. An ECHO was done on 06/06/20 and her LVEF is reduced to 25-30% (was 55-60% in May 2020 and 30-35% in December 2019). R/LHC was done on 06/09/20 and she was found to have severe diffuse 3-vessel CAD. RA 3, PA 15, PCWP 6, CO 4.99, CI 2.75. She was not deemed to be a candidate for CABG at this time per TCTS. Plan for elective PCI to LAD on 06/11/20.  Current HF Medications: Carvedilol 12.5 mg BID  Prior to admission HF Medications: Furosemide 20 mg daily PRN Carvedilol 12.5 mg BID Losartan 100 mg daily Jardiance 25 mg daily  Pertinent Lab Values: . Serum creatinine 1.45, BUN 11, Potassium 3.8, Sodium 141, BNP 953.9  Vital Signs: . Weight: 176 lbs (admission weight: 180 lbs) . Blood pressure: 130/70s o Two elevated readings today of 162/80 and 145/67 . Heart rate: 70-80s   Medication Assistance / Insurance Benefits Check: Does the patient have prescription insurance?  Yes Type of insurance plan: [place] Medicaid  Outpatient Pharmacy:  Prior to admission outpatient pharmacy: [facility] outpatient pharmacy Is the patient willing to use [facility] at discharge? Yes   Assessment: 1. Acute on chronic systolic CHF (EF 123XX123), due to ICM, medication noncompliance. NYHA class II/III symptoms. - Continue carvedilol 12.5 mg BID - Would discontinue amlodipine to allow for GDMT for HF therapy - Consider starting Entresto 24/26 mg BID today; cath completed with room in BP for lowering  - Consider restarting Jardiance prior to discharge  - Consider starting  spironolactone prior to discharge   Plan: 1) Medication changes recommended at this time: - Stop amlodipine - Start Entresto 24/26 mg BID today  2) Patient assistance application(s): - None pending - Entresto copay: $3/month  3)  Education  - To be completed prior to discharge  Harriet Pho, PharmD PGY-1 Community Pharmacy Resident   06/11/2020 2:32 PM  Kerby Nora, PharmD, BCPS Heart Failure Stewardship Pharmacist Phone [phone]

## 2020-06-11 NOTE — Progress Notes (Signed)
HD#5 Subjective:  Overnight Events: None  Patient evaluated post-stent placement. She is doing well this afternoon. She notes that she did have some left wrist pain earlier but that has improved with Tylenol. She does endorse some bleeding fr om the right wrist. She was able to tolerate diet. Notes that her sugars are better controlled currently. Patient does not have any acute concerns at this time.  She finally had a bowel movement today. She does feel better this morning.   Objective:  Vital signs in last 24 hours: Vitals:   06/11/20 0806 06/11/20 0811 06/11/20 0827 06/11/20 1016  BP: 126/70 129/77 (!) 145/67 (!) 162/80  Pulse: 80 78 74 72  Resp: '18 15 18 17  '$ Temp:      TempSrc:      SpO2: 98% 99%    Weight:      Height:       Supplemental O2: Room Air SpO2: 99 % O2 Flow Rate (L/min): 2 L/min   Physical Exam:  Blood pressure 133/80, pulse 88, temperature 98.7 F (37.1 C), temperature source Oral, resp. rate 16, height '5\' 3"'$  (1.6 m), weight 81.7 kg, SpO2 96 %.  Constitutional: Patient laying in bed, comfortable, in no acute distress Respiratory: Lungs clear to ascultation anteriorly Cardiovascular: Regular rate and rhythm, no murmurs,  no lower extremity edema Abdomen: Soft, non tender, normal BS Skin: warm, dry  Filed Weights   06/09/20 0344 06/10/20 1007 06/11/20 0500  Weight: 77.9 kg 77.7 kg 79.9 kg     Intake/Output Summary (Last 24 hours) at 06/11/2020 1407 Last data filed at 06/11/2020 0500 Gross per 24 hour  Intake 0 ml  Output 850 ml  Net -850 ml   Net IO Since Admission: -422.39 mL [06/11/20 1407]  Pertinent Labs: CBC Latest Ref Rng & Units 06/11/2020 06/10/2020 06/09/2020  WBC 4.0 - 10.5 K/uL 6.3 5.6 -  Hemoglobin 12.0 - 15.0 g/dL 10.9(L) 10.6(L) 10.5(L)  Hematocrit 36.0 - 46.0 % 33.5(L) 33.9(L) 31.0(L)  Platelets 150 - 400 K/uL 181 176 -    CMP Latest Ref Rng & Units 06/11/2020 06/10/2020 06/09/2020  Glucose 70 - 99 mg/dL 117(H) 360(H) 203(H)  BUN 8  - 23 mg/dL '11 14 12  '$ Creatinine 0.44 - 1.00 mg/dL 1.45(H) 1.70(H) 1.68(H)  Sodium 135 - 145 mmol/L 141 141 139  Potassium 3.5 - 5.1 mmol/L 3.8 4.0 4.0  Chloride 98 - 111 mmol/L 109 110 110  CO2 22 - 32 mmol/L 23 23 20(L)  Calcium 8.9 - 10.3 mg/dL 8.8(L) 8.6(L) 8.3(L)  Total Protein 6.5 - 8.1 g/dL - - -  Total Bilirubin 0.3 - 1.2 mg/dL - - -  Alkaline Phos 38 - 126 U/L - - -  AST 15 - 41 U/L - - -  ALT 0 - 44 U/L - - -   Imaging: ECHOCARDIOGRAM COMPLETE  Result Date: 06/06/2020 IMPRESSIONS   1. When compared to the prior study from 09/13/2018 LVEF has decreased 55-60% to 25-30% with diffuse hypokinesis.   2. Left ventricular ejection fraction, by estimation, is 25 to 30%. The left ventricle has severely decreased function. The left ventricle demonstrates global hypokinesis. There is severe concentric left ventricular hypertrophy. Left ventricular diastolic parameters are consistent with Grade I diastolic dysfunction (impaired relaxation). Elevated left ventricular end-diastolic pressure.   3. Right ventricular systolic function is normal. The right ventricular size is normal.   4. Left atrial size was mildly dilated.   5. The pericardial effusion is posterior to the left  ventricle.   6. The mitral valve is normal in structure. Mild mitral valve regurgitation. No evidence of mitral stenosis.   7. The aortic valve is normal in structure. There is moderate calcification of the aortic valve. There is moderate thickening of the aortic valve. Aortic valve regurgitation is mild. Mild to moderate aortic valve sclerosis/calcification is present, without any evidence of aortic stenosis.   8. The inferior vena cava is normal in size with greater than 50% respiratory variability, suggesting right atrial pressure of 3 mmHg.   Right/Left heart cath and coronary angiodynagraphy   Severe diffuse three-vessel coronary artery disease.  40 to 50% ostial LAD, 85% mid LAD.  Severe obstruction first obtuse  marginal/ramus intermedius.  85% proximal to mid circumflex stenosis.  Severe diffuse disease in each of 3 obtuse marginal branches.  Total occlusion of the proximal to mid RCA.  LVEDP is normal.  Left ventriculography was not performed because of renal insufficiency.  Assessment/Plan:   Principal Problem:   NSTEMI (non-ST elevated myocardial infarction) ([place]) Active Problems:   HTN (hypertension)   CKD (chronic kidney disease) stage 3, GFR 30-59 ml/min (HCC)   Chronic combined systolic and diastolic CHF, NYHA class 2 (HCC)   Type II diabetes mellitus with stage 3 chronic kidney disease ([place])   Hyperglycemia  Patient Summary: Ms. Schellinger is a 65 y/o female with history of type II DM, HFrEF, CAD, COPD, HTN, CKD IIIb and PVD who presents with one week of intermittent chest pain.  # NSTEMI  # HFrEF #CAD s/p DES  Feeling well after procedure. However Echo now with reduced ED of 25-30% with pericardial effusion. Cath with severe triple vessel disease. Not a candidate for CABG at this time may reconsider once COVID symptoms improve and diabetes more controlled. Had PCI with DES placed in LAD. Tolerated procedure well.  - Cardiology consulted, appreciate their recommendations - ASA '81mg'$  daily, Brillinta 90 mg BID - Lipitor '40mg'$  daily, amlodipine 10 mg daily - Switch coreg to entresto per cardiology - No SGLT2 in setting of recent DKA, may consider MRA in future if kidney function tolerates - NTG and Zofran PRN - strict I/Os - Continuous telemetry and pulse ox  -PT/OT: outpatient PT and OT  # DKA - resolved # non insulin dependent type 2 diabetes  Presented with DKA not taking medication prior to admission due to feeling unwell after COVID. A1c >15.5. Now on 20 units lantus daily with glucose in 120s. - Continue Lantus 20 units daily, novolog 4 units with meals - SSI moderate - CBG monitoring - Will plan to discharge her on home trulicity, restart metformin, and Lantus 20 units.    # AKI on CKD IIIa Creatinine 1.4 this morning. stable -Monitor BMP  # HLD Lipid profile with LDL 99 - Continue home atorvastatin '40mg'$  daily   # GERD - Continue Protonix '40mg'$  daily  Code Status:Full code  Diet:Carb modified, heart healthy [id number] DVT PPx: heparin  Dispo: Anticipated discharge to Home in 1 days pending medical mangement.   Iona Beard, MD 06/11/2020, 2:07 PM Pager: [phone]  Please contact the on call pager after 5 pm and on weekends at [phone].

## 2020-06-12 ENCOUNTER — Other Ambulatory Visit: Payer: Self-pay | Admitting: Student

## 2020-06-12 ENCOUNTER — Other Ambulatory Visit: Payer: Self-pay | Admitting: Cardiology

## 2020-06-12 DIAGNOSIS — Z7982 Long term (current) use of aspirin: Secondary | ICD-10-CM | POA: Diagnosis not present

## 2020-06-12 DIAGNOSIS — I428 Other cardiomyopathies: Secondary | ICD-10-CM | POA: Diagnosis not present

## 2020-06-12 DIAGNOSIS — I5042 Chronic combined systolic (congestive) and diastolic (congestive) heart failure: Secondary | ICD-10-CM | POA: Diagnosis not present

## 2020-06-12 DIAGNOSIS — I130 Hypertensive heart and chronic kidney disease with heart failure and stage 1 through stage 4 chronic kidney disease, or unspecified chronic kidney disease: Secondary | ICD-10-CM | POA: Diagnosis not present

## 2020-06-12 DIAGNOSIS — Z79899 Other long term (current) drug therapy: Secondary | ICD-10-CM

## 2020-06-12 DIAGNOSIS — K219 Gastro-esophageal reflux disease without esophagitis: Secondary | ICD-10-CM | POA: Diagnosis not present

## 2020-06-12 DIAGNOSIS — N1831 Chronic kidney disease, stage 3a: Secondary | ICD-10-CM | POA: Diagnosis not present

## 2020-06-12 DIAGNOSIS — Z8616 Personal history of COVID-19: Secondary | ICD-10-CM | POA: Diagnosis not present

## 2020-06-12 DIAGNOSIS — E1310 Other specified diabetes mellitus with ketoacidosis without coma: Secondary | ICD-10-CM | POA: Diagnosis not present

## 2020-06-12 DIAGNOSIS — E785 Hyperlipidemia, unspecified: Secondary | ICD-10-CM | POA: Diagnosis not present

## 2020-06-12 DIAGNOSIS — Z7984 Long term (current) use of oral hypoglycemic drugs: Secondary | ICD-10-CM | POA: Diagnosis not present

## 2020-06-12 DIAGNOSIS — I214 Non-ST elevation (NSTEMI) myocardial infarction: Secondary | ICD-10-CM | POA: Diagnosis not present

## 2020-06-12 DIAGNOSIS — N179 Acute kidney failure, unspecified: Secondary | ICD-10-CM | POA: Diagnosis not present

## 2020-06-12 DIAGNOSIS — I313 Pericardial effusion (noninflammatory): Secondary | ICD-10-CM | POA: Diagnosis not present

## 2020-06-12 DIAGNOSIS — E1110 Type 2 diabetes mellitus with ketoacidosis without coma: Secondary | ICD-10-CM | POA: Diagnosis not present

## 2020-06-12 LAB — BASIC METABOLIC PANEL
Anion gap: 9 (ref 5–15)
Anion gap: 10 (ref 5–15)
BUN: 13 mg/dL (ref 8–23)
BUN: 11 mg/dL (ref 8–23)
CO2: 23 mmol/L (ref 22–32)
CO2: 23 mmol/L (ref 22–32)
Calcium: 8.6 mg/dL — ABNORMAL LOW (ref 8.9–10.3)
Calcium: 8.7 mg/dL — ABNORMAL LOW (ref 8.9–10.3)
Chloride: 102 mmol/L (ref 98–111)
Chloride: 104 mmol/L (ref 98–111)
Creatinine, Ser: 1.61 mg/dL — ABNORMAL HIGH (ref 0.44–1.00)
Creatinine, Ser: 1.52 mg/dL — ABNORMAL HIGH (ref 0.44–1.00)
GFR, Estimated: 36 mL/min — ABNORMAL LOW (ref >60)
GFR, Estimated: 38 mL/min — ABNORMAL LOW (ref >60)
Glucose, Bld: 320 mg/dL — ABNORMAL HIGH (ref 70–99)
Glucose, Bld: 148 mg/dL — ABNORMAL HIGH (ref 70–99)
Potassium: 4.1 mmol/L (ref 3.5–5.1)
Potassium: 4.1 mmol/L (ref 3.5–5.1)
Sodium: 134 mmol/L — ABNORMAL LOW (ref 135–145)
Sodium: 137 mmol/L (ref 135–145)

## 2020-06-12 LAB — GLUCOSE, CAPILLARY
Glucose-Capillary: 206 mg/dL — ABNORMAL HIGH (ref 70–99)
Glucose-Capillary: 143 mg/dL — ABNORMAL HIGH (ref 70–99)

## 2020-06-12 LAB — CBC
HCT: 32.7 % — ABNORMAL LOW (ref 36.0–46.0)
Hemoglobin: 10.9 g/dL — ABNORMAL LOW (ref 12.0–15.0)
MCH: 30 pg (ref 26.0–34.0)
MCHC: 33.3 g/dL (ref 30.0–36.0)
MCV: 90.1 fL (ref 80.0–100.0)
Platelets: 184 10*3/uL (ref 150–400)
RBC: 3.63 MIL/uL — ABNORMAL LOW (ref 3.87–5.11)
RDW: 15.7 % — ABNORMAL HIGH (ref 11.5–15.5)
WBC: 5.2 10*3/uL (ref 4.0–10.5)
nRBC: 0 % (ref 0.0–0.2)

## 2020-06-12 LAB — CULTURE, BLOOD (ROUTINE X 2)
Culture: NO GROWTH
Special Requests: ADEQUATE

## 2020-06-12 MED ORDER — INSULIN GLARGINE 100 UNIT/ML SOLOSTAR PEN: 20.0000 [IU] | PEN_INJECTOR | Freq: Every day | SUBCUTANEOUS | 0 refills | Status: DC

## 2020-06-12 MED ORDER — ATORVASTATIN CALCIUM 80 MG PO TABS: 80.0000 mg | ORAL_TABLET | Freq: Every day | ORAL | Status: DC

## 2020-06-12 MED ORDER — INSUPEN PEN NEEDLES 32G X 4 MM MISC: 1.0000 [IU] | Freq: Every day | 0 refills | Status: DC

## 2020-06-12 MED ORDER — ATORVASTATIN CALCIUM 80 MG PO TABS: 80.0000 mg | ORAL_TABLET | Freq: Every day | ORAL | 0 refills | Status: DC

## 2020-06-12 MED ORDER — TICAGRELOR 90 MG PO TABS: 90.0000 mg | ORAL_TABLET | Freq: Two times a day (BID) | ORAL | 0 refills | Status: AC

## 2020-06-12 MED ORDER — EMPAGLIFLOZIN 10 MG PO TABS
10.0000 mg | ORAL_TABLET | Freq: Every day | ORAL | Status: DC
  Administered 2020-06-12: 10 mg via ORAL
  Filled 2020-06-12: qty 1

## 2020-06-12 MED FILL — BRILINTA 90 MG TABLET: 90 | 30 days supply | Qty: 60 | Fill #0

## 2020-06-12 MED FILL — PENTIPS 32G X 4 MM MISC: 32G X 4 MM | 30 days supply | Qty: 100 | Fill #0

## 2020-06-12 MED FILL — LANTUS SOLOSTAR 100 UNITS/M: 100 | 30 days supply | Qty: 6 | Fill #0

## 2020-06-12 MED FILL — ATORVASTATIN CALCIUM 80 MG: 80 | 30 days supply | Qty: 30 | Fill #0

## 2020-06-12 NOTE — Progress Notes (Signed)
Discharge instructions (including medications) discussed with and copy provided to patient/caregiver 

## 2020-06-12 NOTE — Progress Notes (Signed)
CARDIAC REHAB PHASE I   Offered to walk with pt. Pt eating breakfast, wanting time for food to digest. Reinforced stent/MI education. Reinforced importance of ASA and Brilinta. Reviewed restrictions, site care, and exercise guidelines. Pt requesting home health for help at home. RN made aware. Referred to CRP II [place]. Hopeful for d/c today.   [id number] Rufina Falco, RN BSN 06/12/2020 8:59 AM

## 2020-06-12 NOTE — Progress Notes (Signed)
Heart Failure Stewardship Pharmacist Progress Note   PCP: Marty Heck, DO PCP-Cardiologist: Sinclair Grooms, MD    HPI:  65 yo F with PMH of T2DM, HFrEF, CAD, COPD, HTN, CKD IIIb, and PVD. She presented to the ED on 06/06/20 with chest pain and was admitted for NSTEMI. She was not taking any of her PTA medications regularly since her recent hospitalization for COVID-19 in December. She was ultimately also found to be in DKA and with an AKI. An ECHO was done on 06/06/20 and her LVEF is reduced to 25-30% (was 55-60% in May 2020 and 30-35% in December 2019). R/LHC was done on 06/09/20 and she was found to have severe diffuse 3-vessel CAD. RA 3, PA 15, PCWP 6, CO 4.99, CI 2.75. She was not deemed to be a candidate for CABG at this time per TCTS. S/p elective PCI to LAD on 06/11/20.  Current HF Medications: Furosemide 20 mg daily Carvedilol 12.5 mg BID  Prior to admission HF Medications: Furosemide 20 mg daily PRN Carvedilol 12.5 mg BID Losartan 100 mg daily Jardiance 25 mg daily  Pertinent Lab Values: . Serum creatinine 1.52, BUN 11, Potassium 4.1, Sodium 137, BNP 953.9  Vital Signs: . Weight: 176 lbs (admission weight: 180 lbs) . Blood pressure: 100-140/60s . Heart rate: 70-80s   Medication Assistance / Insurance Benefits Check: Does the patient have prescription insurance?  Yes Type of insurance plan: [place] Medicaid  Outpatient Pharmacy:  Prior to admission outpatient pharmacy: [facility] outpatient pharmacy Is the patient willing to use [facility] pharmacy at discharge? Yes   Assessment: 1. Acute on chronic systolic CHF (EF 123XX123), due to ICM, medication noncompliance. NYHA class II/III symptoms. - Continue carvedilol 12.5 mg BID - Losartan on hold with soft BP. Consider starting Entresto 24/26 mg BID once BP allows. - Consider restarting Jardiance prior to discharge  - Consider starting spironolactone prior to discharge - Amlodipine was discontinued yesterday to allow for optimization  for HF GDMT   Plan: 1) Medication changes recommended at this time: - Restart Jardiance 25 mg daily  2) Patient assistance application(s): - None pending - Entresto copay: $3/month  3)  Education  - Patient has been educated on current HF medications and potential additions to HF medication regimen - Patient verbalizes understanding that over the next few months, these medication doses may change and more medications may be added to optimize HF regimen - Patient has been educated on basic disease state pathophysiology and goals of therapy - Provided her with pill box and scale from HF clinic - She will follow up with HF TOC clinic on 06/16/20. Will arrange for [facility] to take her to and from appointments to improve compliance / no-show rate. - Time spent 30 mins  Kerby Nora, PharmD, BCPS Heart Failure Stewardship Pharmacist Phone [phone]

## 2020-06-12 NOTE — TOC Initial Note (Signed)
Transition of Care [facility]) - Initial/Assessment Note    Patient Details  Name: [name] MRN: [id number] Date of Birth: June 13, 1955  Transition of Care [facility]) CM/SW Contact:    Bethena Roys, RN Phone Number: 06/12/2020, 2:42 PM  Clinical Narrative: High risk for readmission assessment completed. Patient presented for Nstemi from home with spouse. Patient has Medicaid and is in need of personal care services. Case Manager discussed with patient to go through her DSS Social Worker for assistance with navigating through personal care service needs. Pt will contact DSS. Case Manager discussed home health physical therapy with the patient and she is agreeable to services. Case Manager was able to secure the services via [facility]. Start of care to begin within 24-48 hours post transition home. Patient has transportation via private vehicle for home. No further needs identified at this time.                 Expected Discharge Plan: [place] Barriers to Discharge: No Barriers Identified   Patient Goals and CMS Choice Patient states their goals for this hospitalization and ongoing recovery are:: to return home   Choice offered to / list presented to :  (The patient has no preference- just to make sure the company is in network with Medicaid.)  Expected Discharge Plan and Services Expected Discharge Plan: [place]   Discharge Planning Services: CM Consult Post Acute Care Choice: [place] arrangements for the past 2 months: Apartment Expected Discharge Date: 06/12/20               DME Arranged: N/A         HH Arranged: PT HH Agency: [facility] Date HH Agency Contacted: 06/12/20 Time HH Agency Contacted: 1400 Representative spoke with at [place]: Danae Chen  Prior Living Arrangements/Services Living arrangements for the past 2 months: Apartment Lives with:: Spouse Patient language and need for interpreter  reviewed:: Yes Do you feel safe going back to the place where you live?: Yes      Need for Family Participation in Patient Care: Yes (Comment)   Current home services: DME (Pt has 3n1 in the home)    Activities of Daily Living Home Assistive Devices/Equipment: Environmental consultant (specify type) ADL Screening (condition at time of admission) Patient's cognitive ability adequate to safely complete daily activities?: Yes Is the patient deaf or have difficulty hearing?: No Does the patient have difficulty seeing, even when wearing glasses/contacts?: No Does the patient have difficulty concentrating, remembering, or making decisions?: No Patient able to express need for assistance with ADLs?: Yes Does the patient have difficulty dressing or bathing?: No Independently performs ADLs?: Yes (appropriate for developmental age) Does the patient have difficulty walking or climbing stairs?: No Weakness of Legs: Both Weakness of Arms/Hands: Both  Permission Sought/Granted Permission sought to share information with : Family Estate agent Permission granted to share information with : Yes, Verbal Permission Granted     Permission granted to share info w AGENCY: [facility]        Emotional Assessment Appearance:: Appears stated age Attitude/Demeanor/Rapport: Engaged Affect (typically observed): Appropriate Orientation: : Oriented to Situation,Oriented to  Time,Oriented to Place,Oriented to Self Alcohol / Substance Use: Not Applicable Psych Involvement: No (comment)  Admission diagnosis:  Hyperglycemia [R73.9] NSTEMI (non-ST elevated myocardial infarction) ([place]) [I21.4] Hypotension [I95.9] Patient Active Problem List   Diagnosis Date Noted  . Hyperglycemia   . NSTEMI (non-ST elevated myocardial infarction) (  [place]) 06/06/2020  . COVID-19 04/13/2020  . Musculoskeletal neck pain 02/17/2020  . Abdominal fullness 09/26/2019  . Incomplete uterine prolapse  11/23/2018  . GERD (gastroesophageal reflux disease) 11/14/2018  . Mild depression ([place]) 04/11/2018  . Nonischemic cardiomyopathy ([place]) 08/31/2017  . Acute respiratory failure with hypoxia ([place]) 06/02/2017  . Pain in toes of both feet 01/26/2017  . Bilateral carotid artery disease ([place]) 11/10/2016  . Seasonal allergies 06/30/2016  . Health care maintenance 06/16/2015  . Nonproliferative diabetic retinopathy associated with type 2 diabetes mellitus ([place]) 06/15/2015  . Osteoarthritis of right hip 06/09/2015  . Type II diabetes mellitus with stage 3 chronic kidney disease ([place]) 07/17/2013  . Chronic combined systolic and diastolic CHF, NYHA class 2 ([place]) 07/07/2013  . CKD (chronic kidney disease) stage 3, GFR 30-59 ml/min (HCC) 11/22/2012  . HTN (hypertension) 11/08/2012   PCP:  Marty Heck, DO Pharmacy:   [place], [place] - [address] [facility]. [address] [place] [address] Phone: [phone] Fax: [place], [place] [address] Phone: [phone] Fax: [phone]  [facility] [place], [place] [address] [place] [address] Phone: [phone] Fax: [phone]     Social Determinants of Health (SDOH) Interventions    Readmission Risk Interventions Readmission Risk Prevention Plan 06/12/2020  Transportation Screening Complete  PCP or Specialist Appt within 5-7 Days Complete  Home Care Screening Complete  Medication Review (RN CM) Complete  Some recent data might be hidden

## 2020-06-12 NOTE — Progress Notes (Addendum)
Progress Note  Patient Name: [name] Date of Encounter: 06/12/2020  [facility] Cardiologist: Sinclair Grooms, MD   Subjective   Doing well this morning  No issues overnight  Inpatient Medications    Scheduled Meds: . aspirin EC  81 mg Oral Daily  . atorvastatin  40 mg Oral q1800  . carvedilol  12.5 mg Oral BID WC  . heparin  5,000 Units Subcutaneous Q8H  . insulin aspart  0-15 Units Subcutaneous TID WC  . insulin aspart  4 Units Subcutaneous TID WC  . insulin glargine  20 Units Subcutaneous QHS  . pantoprazole  40 mg Oral Daily  . sodium chloride flush  3 mL Intravenous Q12H  . sodium chloride flush  3 mL Intravenous Q12H  . sodium chloride flush  3 mL Intravenous Q12H  . ticagrelor  90 mg Oral BID   Continuous Infusions: . sodium chloride    . sodium chloride     PRN Meds: sodium chloride, sodium chloride, acetaminophen, albuterol, dextrose, nitroGLYCERIN, ondansetron (ZOFRAN) IV, oxyCODONE, polyethylene glycol, senna, sodium chloride flush, sodium chloride flush   Vital Signs    Vitals:   06/11/20 1515 06/11/20 1800 06/11/20 2038 06/12/20 0500  BP:  (!) 156/69 126/66 100/77  Pulse: 84 79 85 78  Resp:   18 19  Temp:   99.8 F (37.7 C) 99.1 F (37.3 C)  TempSrc:   Oral Oral  SpO2: 96% 97%  98%  Weight:      Height:        Intake/Output Summary (Last 24 hours) at 06/12/2020 0807 Last data filed at 06/11/2020 2230 Gross per 24 hour  Intake 240 ml  Output --  Net 240 ml   Last 3 Weights 06/11/2020 06/10/2020 06/09/2020  Weight (lbs) 176 lb 2.4 oz 171 lb 4.8 oz 171 lb 11.8 oz  Weight (kg) 79.9 kg 77.7 kg 77.9 kg      Telemetry    SR - Personally Reviewed  ECG    SR, LBBB with TWI in lateral leads - Personally Reviewed  Physical Exam   GEN: No acute distress.   Neck: No JVD Cardiac: RRR, no murmurs, rubs, or gallops.  Respiratory: Clear to auscultation bilaterally. GI: Soft, nontender, non-distended  MS: No edema; No deformity. Right  radial cath site stable Neuro:  Nonfocal  Psych: Normal affect   Labs    High Sensitivity Troponin:   Recent Labs  Lab 06/06/20 0227 06/06/20 0455  TROPONINIHS 2,410* 2,238*      Chemistry Recent Labs  Lab 06/06/20 0227 06/06/20 0258 06/10/20 0324 06/11/20 0304 06/12/20 0113  NA 128*   < > 141 141 134*  K 4.7   < > 4.0 3.8 4.1  CL 96*   < > 110 109 102  CO2 18*   < > '23 23 23  '$ GLUCOSE 661*   < > 360* 117* 320*  BUN 32*   < > '14 11 13  '$ CREATININE 2.48*   < > 1.70* 1.45* 1.61*  CALCIUM 8.3*   < > 8.6* 8.8* 8.6*  PROT 6.2*  --   --   --   --   ALBUMIN 2.7*  --   --   --   --   AST 19  --   --   --   --   ALT 14  --   --   --   --   ALKPHOS 80  --   --   --   --  BILITOT 1.2  --   --   --   --   GFRNONAA 21*   < > 33* 41* 36*  ANIONGAP 14   < > '8 9 9   '$ < > = values in this interval not displayed.     Hematology Recent Labs  Lab 06/10/20 0324 06/11/20 0304 06/12/20 0113  WBC 5.6 6.3 5.2  RBC 3.68* 3.71* 3.63*  HGB 10.6* 10.9* 10.9*  HCT 33.9* 33.5* 32.7*  MCV 92.1 90.3 90.1  MCH 28.8 29.4 30.0  MCHC 31.3 32.5 33.3  RDW 15.7* 15.8* 15.7*  PLT 176 181 184    BNP Recent Labs  Lab 06/06/20 0228  BNP 953.9*     DDimer No results for input(s): DDIMER in the last 168 hours.   Radiology    CARDIAC CATHETERIZATION  Result Date: 06/11/2020  80% eccentric mid LAD stenosis with TIMI grade III flow reduced to 0% with TIMI grade III flow using a 15 x 2.5 Onyx postdilated to 2.75 mm with high-pressure [place] balloon.  No complications including absence of angina and hemodynamic disturbance during balloon inflations. RECOMMENDATIONS:  Aspirin and Brilinta for 12 months. After 12 months consider long-term P2 Y 12 inhibitor monotherapy either Plavix or reduced dose Brilinta.   Cardiac Studies   Cath: 06/09/20   Severe diffuse three-vessel coronary artery disease.  40 to 50% ostial LAD, 85% mid LAD.  Severe obstruction first obtuse marginal/ramus  intermedius.  85% proximal to mid circumflex stenosis.  Severe diffuse disease in each of 3 obtuse marginal branches.  Total occlusion of the proximal to mid RCA.  LVEDP is normal.  Left ventriculography was not performed because of renal insufficiency.  RECOMMENDATIONS:   Consider multivessel coronary bypass grafting given diabetes, relatively young age, and LV dysfunction.    Diagnostic Dominance: Right    Cath: 06/11/20   80% eccentric mid LAD stenosis with TIMI grade III flow reduced to 0% with TIMI grade III flow using a 15 x 2.5 Onyx postdilated to 2.75 mm with high-pressure [place] balloon.  No complications including absence of angina and hemodynamic disturbance during balloon inflations.  RECOMMENDATIONS:   Aspirin and Brilinta for 12 months. After 12 months consider long-term P2 Y 12 inhibitor monotherapy either Plavix or reduced dose Brilinta.   Diagnostic Dominance: Right    Intervention    Echo: 06/06/20  IMPRESSIONS    1. When compared to the prior study from 09/13/2018 LVEF has decreased  55-60% to 25-30% with diffuse hypokinesis.  2. Left ventricular ejection fraction, by estimation, is 25 to 30%. The  left ventricle has severely decreased function. The left ventricle  demonstrates global hypokinesis. There is severe concentric left  ventricular hypertrophy. Left ventricular  diastolic parameters are consistent with Grade I diastolic dysfunction  (impaired relaxation). Elevated left ventricular end-diastolic pressure.  3. Right ventricular systolic function is normal. The right ventricular  size is normal.  4. Left atrial size was mildly dilated.  5. The pericardial effusion is posterior to the left ventricle.  6. The mitral valve is normal in structure. Mild mitral valve  regurgitation. No evidence of mitral stenosis.  7. The aortic valve is normal in structure. There is moderate  calcification of the aortic valve. There is moderate  thickening of the  aortic valve. Aortic valve regurgitation is mild. Mild to moderate aortic  valve sclerosis/calcification is present,  without any evidence of aortic stenosis.  8. The inferior vena cava is normal in size with greater than 50%  respiratory variability, suggesting right atrial pressure of 3 mmHg.   Patient Profile     65 y.o. female with a hx of poorly controlled DM, HFrEF, unrevascularized CAD, COPD, CKD, HTN and PVDwho is being seen today for the evaluation of elevated troponin, now with reduced EF.  Assessment & Plan    1. NSTEMI: underwent cardiac cath 2/8 with Dr. Tamala Julian, severe diffuse 3v CAD with CTO of the RCA, 85% p/m LCx with branch disease and 85% mLAD lesion. TCTS consulted and felt not to be a good candidate for CABG as she is deconditioned from recent COVID infection. She was taken back to the cath lab on 2/10 and underwent successful PCI/DES x1 to the mLAD without complications. No chest pain overnight.  -- will continue on ASA/Brilinta for at least one year  2. ICM: EF noted at 25-30% with diffuse hypokinesis. Tolerating coreg 12.'5mg'$  BID, amlodipine stopped yesterday to allow for more blood pressure -- plan was to place on Entresto but BPs are soft this morning -- would plan to address at follow up and start if blood pressures are stable -- on Jardiance '25mg'$  daily PTA -- no volume overload on exam  3. HLD: LDL 99 -- will increase atorvastatin to '80mg'$  daily  4. CKD: baseline around 1.7 -- stable at 1.61 today  5. HTN: as above, tolerating coreg -- consider Entresto start as an outpatient  6. DM: Hgb A1c 8.9 -- on Jardiance, Trulicity and metformin prior to admission  Follow up has been arranged in the office, along with repeat labs for next week  For questions or updates, please contact [place] Please consult [email or web address] for contact info under        Signed, Reino Bellis, NP  06/12/2020, 8:07 AM    I have examined the patient  and reviewed assessment and plan and discussed with patient.  Agree with above as stated.  COntinue medical therapy.  Start Entresto when BP stabilizes.  Renal function should tolerate.  Unclear why BP was low yesterday when we were trying to initiate Entresto.    DM control will be imprtant.  Will need to recheck EF in a few months.    Larae Grooms

## 2020-06-12 NOTE — Plan of Care (Signed)
  Problem: Acute Rehab PT Goals(only PT should resolve) Goal: Pt Will Go Supine/Side To Sit Outcome: Adequate for Discharge Goal: Pt Will Go Sit To Supine/Side Outcome: Adequate for Discharge Goal: Patient Will Transfer Sit To/From Stand Outcome: Adequate for Discharge Goal: Pt Will Transfer Bed To Chair/Chair To Bed Outcome: Adequate for Discharge Goal: Pt Will Ambulate Outcome: Adequate for Discharge Goal: Pt Will Go Up/Down Stairs Outcome: Adequate for Discharge

## 2020-06-12 NOTE — Progress Notes (Signed)
Physical Therapy Treatment Patient Details Name: [name] MRN: [id number] DOB: 10/12/1955 Today's Date: 06/12/2020    History of Present Illness Pt is a 65 y.o. female who presents with mild diabetic ketoacidosis with progressive weakness over past week with confusion. She was also found to have chest pain and elevated troponin, likely a type II NSTEMI in the setting of hypotension and SKA with known ischemic heart disease. PMH: DM2, HFrecEF, CAD, COPD, HTN, CKD IIIb, MI, arthritis, and PVD.    PT Comments    Pt continues to make steady progress with mobility. History of falls at home. Recommend further PT for this at home.   Follow Up Recommendations  Home health PT;Supervision - Intermittent     Equipment Recommendations  None recommended by PT    Recommendations for Other Services       Precautions / Restrictions Precautions Precautions: Fall    Mobility  Bed Mobility Overal bed mobility: Modified Independent       Supine to sit: Modified independent (Device/Increase time);HOB elevated Sit to supine: Modified independent (Device/Increase time);HOB elevated        Transfers Overall transfer level: Needs assistance Equipment used: Rolling walker (2 wheeled) Transfers: Sit to/from Stand Sit to Stand: Supervision         General transfer comment: supervision for safety  Ambulation/Gait Ambulation/Gait assistance: Supervision Gait Distance (Feet): 200 Feet Assistive device: Rolling walker (2 wheeled) Gait Pattern/deviations: Step-through pattern;Decreased stride length;Trendelenburg Gait velocity: decr Gait velocity interpretation: <1.31 ft/sec, indicative of household ambulator General Gait Details: supervision for safety due to history of falls with knee buckling   Stairs             Wheelchair Mobility    Modified Rankin (Stroke Patients Only)       Balance Overall balance assessment: Needs assistance Sitting-balance support: No  upper extremity supported;Feet supported Sitting balance-Leahy Scale: Normal     Standing balance support: No upper extremity supported Standing balance-Leahy Scale: Fair                              Cognition Arousal/Alertness: Awake/alert Behavior During Therapy: WFL for tasks assessed/performed Overall Cognitive Status: Within Functional Limits for tasks assessed                                        Exercises      General Comments General comments (skin integrity, edema, etc.): VSS on RA      Pertinent Vitals/Pain Pain Assessment: No/denies pain    Home Living                      Prior Function            PT Goals (current goals can now be found in the care plan section) Acute Rehab PT Goals Patient Stated Goal: have CABG to be able to "watch her grands." Progress towards PT goals: Progressing toward goals    Frequency    Min 3X/week      PT Plan Discharge plan needs to be updated    Co-evaluation              AM-PAC PT "6 Clicks" Mobility   Outcome Measure  Help needed turning from your back to your side while in a flat bed without using bedrails?: None Help needed moving from  lying on your back to sitting on the side of a flat bed without using bedrails?: None Help needed moving to and from a bed to a chair (including a wheelchair)?: None Help needed standing up from a chair using your arms (e.g., wheelchair or bedside chair)?: None Help needed to walk in hospital room?: A Little Help needed climbing 3-5 steps with a railing? : A Little 6 Click Score: 22    End of Session Equipment Utilized During Treatment: Gait belt Activity Tolerance: Patient tolerated treatment well Patient left: in bed;with call bell/phone within reach;with bed alarm set   PT Visit Diagnosis: Unsteadiness on feet (R26.81);Muscle weakness (generalized) (M62.81)     Time: 1211-1227 PT Time Calculation (min) (ACUTE ONLY): 16  min  Charges:  $Gait Training: 8-22 mins                     [place] Pager [phone] Office [place] 06/12/2020, 12:35 PM

## 2020-06-15 ENCOUNTER — Telehealth (HOSPITAL_COMMUNITY): Payer: Self-pay

## 2020-06-15 NOTE — Progress Notes (Incomplete)
[facility]  VCB:[id number], Jaimie Primary Cardiologist: Daneen Schick  HPI:  [name] is a 65 y.o.  female  with a PMH significant for Combined systolic and diastolic CHF, CAD multivessel disease, s/p recent balloon angioplasty, PVD, poorly controlled T2DM,     ROS: All systems negative except as listed in HPI, PMH and Problem List.  SH:  Social History   Socioeconomic History  . Marital status: Married    Spouse name: Not on file  . Number of children: Not on file  . Years of education: 12,college  . Highest education level: Not on file  Occupational History  . Not on file  Tobacco Use  . Smoking status: Former Smoker    Years: 7.00    Types: Cigarettes    Quit date: 05/08/1987    Years since quitting: 33.1  . Smokeless tobacco: Never Used  . Tobacco comment: 05/10/2018 "smoked when I drank"  Vaping Use  . Vaping Use: Never used  Substance and Sexual Activity  . Alcohol use: Not Currently    Alcohol/week: 0.0 standard drinks    Comment: 05/10/2018 "glass of wine on holidays"  . Drug use: Not Currently  . Sexual activity: Not Currently  Other Topics Concern  . Not on file  Social History Narrative  . Not on file   Social Determinants of Health   Financial Resource Strain: Not on file  Food Insecurity: Not on file  Transportation Needs: Not on file  Physical Activity: Not on file  Stress: Not on file  Social Connections: Not on file  Intimate Partner Violence: Not on file    FH:  Family History  Problem Relation Age of Onset  . Diabetes Mother     Past Medical History:  Diagnosis Date  . Anemia 07/17/2013  . Arthritis    "hands, shoulders, hips, legs" (05/10/2018)  . Arthritis of left hip 08/18/2013   S/p total hip arthroplasty.    . CHF (congestive heart failure) ([place])    2014...@ [facility]  . Chronic lower back pain   . CKD (chronic kidney disease) stage 3, GFR 30-59 ml/min (HCC)   . COPD (chronic obstructive pulmonary disease) ([place])   .  Healthcare maintenance 06/16/2015  . High cholesterol   . Hypertension   . Influenza A 06/01/2017  . Influenza, pneumonia 06/01/2017  . Left flank pain 10/11/2016  . Myocardial infarction [facility])    "been told that I've had one; don't know when it would have been" (05/10/2018)  . Problem with sexual relationship 10/13/2017  . PVD (peripheral vascular disease) ([place])   . Shortness of breath 03/12/2019  . Type II diabetes mellitus ([place])    diagnosed 2001    Current Outpatient Medications  Medication Sig Dispense Refill  . Accu-Chek FastClix Lancets MISC Check blood sugar up to  3x/day before meals 102 each 12  . ACCU-CHEK GUIDE test strip CHECK BLOOD SUGAR UP TO 3X/DAY BEFORE MEALS 100 strip 11  . acetaminophen (TYLENOL) 500 MG tablet Take 1,000 mg by mouth every 6 (six) hours as needed for mild pain, fever or headache.    . albuterol (PROVENTIL HFA) 108 (90 Base) MCG/ACT inhaler Inhale 2 puffs into the lungs every 6 (six) hours as needed for wheezing or shortness of breath. 18 g 0  . aspirin EC 81 MG tablet Take 1 tablet (81 mg total) by mouth daily. IM program, hope fund 30 tablet 11  . atorvastatin (LIPITOR) 80 MG tablet Take 1 tablet (80 mg  total) by mouth daily at 6 PM. 30 tablet 0  . blood glucose meter kit and supplies KIT Dispense based on patient and insurance preference. Use up to four times daily as directed. (FOR ICD-9 250.00, 250.01). 1 each 0  . blood glucose meter kit and supplies KIT Dispense based on patient and insurance preference. Use up to four times daily as directed. (FOR ICD-9 250.00, 250.01). 1 each 0  . Blood Glucose Monitoring Suppl (ACCU-CHEK GUIDE) w/Device KIT 1 each by Does not apply route 3 (three) times daily. 1 kit 0  . carvedilol (COREG) 12.5 MG tablet TAKE 1 TABLET (12.5 MG TOTAL) BY MOUTH 2 (TWO) TIMES DAILY WITH A MEAL. 60 tablet 2  . Cholecalciferol (VITAMIN D3) 125 MCG (5000 UT) CAPS Take 5,000 Units by mouth daily.    . Dulaglutide (TRULICITY) 4.74 QV/9.5GL  SOPN Inject 0.75 mg into the skin once a week. 4 pen 5  . furosemide (LASIX) 20 MG tablet Take 1 tablet (20 mg total) by mouth daily. (Patient taking differently: Take 20 mg by mouth daily as needed for fluid.) 90 tablet 3  . insulin glargine (LANTUS) 100 UNIT/ML Solostar Pen Inject 20 Units into the skin daily. 6 mL 0  . Insulin Pen Needle (INSUPEN PEN NEEDLES) 32G X 4 MM MISC 1 Units by Does not apply route daily. 30 each 0  . metFORMIN (GLUCOPHAGE-XR) 500 MG 24 hr tablet Take 4 tablets (2,000 mg total) by mouth daily with breakfast. (Patient taking differently: Take 1,000 mg by mouth in the morning and at bedtime.) 120 tablet 5  . Multiple Vitamins-Minerals (MULTIVITAMIN GUMMIES ADULT) CHEW Chew 2 tablets by mouth daily.    . nitroGLYCERIN (NITROSTAT) 0.4 MG SL tablet Place 1 tablet (0.4 mg total) under the tongue every 5 (five) minutes as needed for chest pain. 75 tablet 2  . omeprazole (PRILOSEC) 20 MG capsule TAKE 1 CAPSULE (20 MG TOTAL) BY MOUTH DAILY. 30 capsule 2  . ticagrelor (BRILINTA) 90 MG TABS tablet Take 1 tablet (90 mg total) by mouth 2 (two) times daily. 60 tablet 0  . tiZANidine (ZANAFLEX) 4 MG tablet Take 1 tablet (4 mg total) by mouth every 6 (six) hours as needed for muscle spasms. 30 tablet 0  . UNIFINE PENTIPS 31G X 5 MM MISC USE TO INJECT 20 UNITS DAILY 100 each 1   No current facility-administered medications for this visit.    There were no vitals filed for this visit.  PHYSICAL EXAM: ***   ECG   ASSESSMENT & PLAN:  NYHA Class     Refer to Social Work:  PCP  Medications  Transportation   ETOH   Drug Abuse Tax inspector to Pharmacy:  Refer to Development worker, community :  Refer to Home Health:   Refer to [facility]:  Refer to General Cardiology Other    Follow up

## 2020-06-15 NOTE — Addendum Note (Signed)
Addended by: Hulan Fray on: 06/15/2020 05:51 PM   Modules accepted: Orders

## 2020-06-15 NOTE — Telephone Encounter (Signed)
Pt insurance is active and benefits verified through [facility]. Co-pay $3.00, DED $0.00/$0.00 met, out of pocket $0.00/$0.00 met, co-insurance 0%. No pre-authorization required. Passport, 06/15/20 @ 3:53PM, BLT#[id number]  Will contact patient to see if she is interested in the Cardiac Rehab Program. If interested, patient will need to complete follow up appt. Once completed, patient will be contacted for scheduling upon review by the RN Navigator.

## 2020-06-15 NOTE — Telephone Encounter (Signed)
Notified patient of [facility] transportation to arrive at her residence at 2:20-2:30 tomorrow for her 3pm appointment with the [facility] clinic. Reminded to bring all meds to appointment. Pt confirmed she plans to be at appt.

## 2020-06-15 NOTE — Telephone Encounter (Signed)
Called patient to see if she is interested in the Cardiac Rehab Program. Patient expressed interest. Explained scheduling process and went over insurance, patient verbalized understanding. Will contact patient for scheduling once f/u has been completed. 

## 2020-06-16 ENCOUNTER — Encounter (HOSPITAL_COMMUNITY): Payer: Medicaid Other

## 2020-06-18 ENCOUNTER — Other Ambulatory Visit: Payer: Self-pay

## 2020-06-18 ENCOUNTER — Ambulatory Visit (HOSPITAL_COMMUNITY)
Admission: RE | Admit: 2020-06-18 | Discharge: 2020-06-18 | Disposition: A | Discharge status: Home / Self Care | Payer: Medicaid Other | Source: Ambulatory Visit | Attending: Internal Medicine | Admitting: Internal Medicine

## 2020-06-18 ENCOUNTER — Encounter (HOSPITAL_COMMUNITY): Payer: Self-pay

## 2020-06-18 ENCOUNTER — Other Ambulatory Visit (HOSPITAL_COMMUNITY): Payer: Self-pay | Admitting: Internal Medicine

## 2020-06-18 VITALS — BP 124/76 | HR 78 | Wt 172.8 lb

## 2020-06-18 DIAGNOSIS — Z7982 Long term (current) use of aspirin: Secondary | ICD-10-CM | POA: Diagnosis not present

## 2020-06-18 DIAGNOSIS — Z79899 Other long term (current) drug therapy: Secondary | ICD-10-CM | POA: Insufficient documentation

## 2020-06-18 DIAGNOSIS — N1832 Chronic kidney disease, stage 3b: Secondary | ICD-10-CM | POA: Insufficient documentation

## 2020-06-18 DIAGNOSIS — Z955 Presence of coronary angioplasty implant and graft: Secondary | ICD-10-CM | POA: Diagnosis not present

## 2020-06-18 DIAGNOSIS — E7800 Pure hypercholesterolemia, unspecified: Secondary | ICD-10-CM | POA: Diagnosis not present

## 2020-06-18 DIAGNOSIS — E1122 Type 2 diabetes mellitus with diabetic chronic kidney disease: Secondary | ICD-10-CM | POA: Insufficient documentation

## 2020-06-18 DIAGNOSIS — I252 Old myocardial infarction: Secondary | ICD-10-CM | POA: Diagnosis not present

## 2020-06-18 DIAGNOSIS — E1151 Type 2 diabetes mellitus with diabetic peripheral angiopathy without gangrene: Secondary | ICD-10-CM

## 2020-06-18 DIAGNOSIS — I5042 Chronic combined systolic (congestive) and diastolic (congestive) heart failure: Secondary | ICD-10-CM | POA: Insufficient documentation

## 2020-06-18 DIAGNOSIS — E1165 Type 2 diabetes mellitus with hyperglycemia: Secondary | ICD-10-CM

## 2020-06-18 DIAGNOSIS — I447 Left bundle-branch block, unspecified: Secondary | ICD-10-CM | POA: Insufficient documentation

## 2020-06-18 DIAGNOSIS — Z794 Long term (current) use of insulin: Secondary | ICD-10-CM | POA: Diagnosis not present

## 2020-06-18 DIAGNOSIS — I255 Ischemic cardiomyopathy: Secondary | ICD-10-CM | POA: Diagnosis not present

## 2020-06-18 DIAGNOSIS — I2510 Atherosclerotic heart disease of native coronary artery without angina pectoris: Secondary | ICD-10-CM | POA: Insufficient documentation

## 2020-06-18 DIAGNOSIS — Z87891 Personal history of nicotine dependence: Secondary | ICD-10-CM | POA: Diagnosis not present

## 2020-06-18 DIAGNOSIS — I130 Hypertensive heart and chronic kidney disease with heart failure and stage 1 through stage 4 chronic kidney disease, or unspecified chronic kidney disease: Secondary | ICD-10-CM | POA: Insufficient documentation

## 2020-06-18 DIAGNOSIS — IMO0002 Reserved for concepts with insufficient information to code with codable children: Secondary | ICD-10-CM

## 2020-06-18 MED ORDER — ENTRESTO 24-26 MG PO TABS: 1.0000 | ORAL_TABLET | Freq: Two times a day (BID) | ORAL | 3 refills | Status: DC

## 2020-06-18 MED FILL — ENTRESTO 24 MG-26 MG TABLET: 24-26 | 30 days supply | Qty: 60 | Fill #0

## 2020-06-18 MED FILL — ISOSORBIDE MN ER 120 MG TAB: 120 | 30 days supply | Qty: 30 | Fill #7

## 2020-06-18 MED FILL — CARVEDILOL 12.5 MG TABLET: 12.5 | 30 days supply | Qty: 60 | Fill #2

## 2020-06-18 NOTE — Patient Instructions (Signed)
Start Entresto 24/26 mg Twice daily   Thank you for allowing [place] to provider your heart failure care after your recent hospitalization. Please follow-up with our [facility] in 3 weeks  If you have any questions or concerns before your next appointment please send [place] a message through [place] or call our office at [phone].    TO LEAVE A MESSAGE FOR THE NURSE SELECT OPTION 2, PLEASE LEAVE A MESSAGE INCLUDING: . YOUR NAME . DATE OF BIRTH . CALL BACK NUMBER . REASON FOR CALL**this is important as we prioritize the call backs  YOU WILL RECEIVE A CALL BACK THE SAME DAY AS LONG AS YOU CALL BEFORE 4:00 PM

## 2020-06-18 NOTE — Progress Notes (Incomplete)
[facility]  PCP: Molli Hazard Primary Cardiologist: Daneen Schick  HPI:  [name] is a 65 y.o.  female  with a PMH significant for Combined systolic and diastolic CHF, CAD multivessel disease, s/p recent PCI to mid LAD, PVD, poorly controlled T2DM, chronic LBBB,   Followed by Cardiology since around 0211 for systolic and diastolic CHF and PVD.  EF 45-50% 2014, 30-35% in 2015 and TR/AI now mild-mod from mild, EF recovered to normal in 2018.  She had been doing well until around 2019.  She had worsening dypsnea and anginal symptoms.  Her EF went back down in early 2019 and back up to normal 3 months later did okay for a while then developed acute decompensated CHF in December and  then an ECHO was repeated at that time showing severely reduced EF and she subsequently underwent LHC with results below:   Severe diffuse involvement of the circumflex with 70 to 80% proximal to mid stenosis, diffuse 80% and a large second obtuse marginal, and segmental 80% stenosis in the ostial to proximal first obtuse marginal/ramus.  50% ostial LAD with mid vessel eccentric 65 to 70% narrowing.  Chronic occlusion of the mid RCA fills by left to right collaterals.  Relatively normal pulmonary artery pressures and normal LV EDP.  Echo demonstrates reduced LVEF 30 to 35%.  RECOMMENDATIONS:   Guideline directed therapy for LV systolic dysfunction  Anti-ischemic therapy with long-acting nitrates  Aggressive lipid-lowering  If unable to control symptoms consider referral for surgical revascularization opinion. Reluctant to refer the patient at this time because LAD has only moderate disease. Circumflex territory has multiple diffusely diseased segments as is typical in diabetic patients.  Managed Medically and ECHO repeated in 2020 with return to normal EF.    She was also admitted 05/2018 for respiratory failure due to RSV and acute CHF.  Managed  as an outpatient with some continued symptoms mostly chest discomfort until she developed Covid December 2021 which she was treated for and did not require any diuresis at that time.    Recently she was admitted for Chest pain with NSTEMI on 06/2020.  She had a repeat LHC with results below:   Severe diffuse three-vessel coronary artery disease.  40 to 50% ostial LAD, 85% mid LAD.  Severe obstruction first obtuse marginal/ramus intermedius.  85% proximal to mid circumflex stenosis. Severe diffuse disease in each of 3 obtuse marginal branches.  Total occlusion of the proximal to mid RCA.  LVEDP is normal. Left ventriculography was not performed because of renal insufficiency.  RECOMMENDATIONS:   Consider multivessel coronary bypass grafting given diabetes, relatively young age, and LV dysfunction. Discussed with Dr. Illene Bolus.  ECHO 06/06/20: When compared to the prior study from 09/13/2018 LVEF has decreased  55-60% to 25-30% with diffuse hypokinesis.  G1DD, normal RV, no clinically significant valvular pathology  CT surgery was consulted but it was felt she was not a good surgical candidate due to her recent covid 19 infection and her other comorbidities including poorly controlled T2DM.  She underwent repeat LHC the following day and underwent PCI to mid LAD and started on DAPT.    Has not had chest pain since hospitalization.  Goes to [employer] but uses the cart because of her hip pain and dyspnea.  Has not really exerted herself since leaving the hospital.  Says her cbg values have been under excellent control.  Averaging between 100 and 120. Not weighing herself at home.  Her weight is stable today within a lb of her discharge weight at 172lbs.  Has lasix as needed but hasn't needed to take it.     ROS: All systems negative except as listed in HPI, PMH and Problem List.  SH:  Social History   Socioeconomic History  . Marital status: Married    Spouse name: Not on file  .  Number of children: Not on file  . Years of education: 12,college  . Highest education level: Not on file  Occupational History  . Not on file  Tobacco Use  . Smoking status: Former Smoker    Years: 7.00    Types: Cigarettes    Quit date: 05/08/1987    Years since quitting: 33.1  . Smokeless tobacco: Never Used  . Tobacco comment: 05/10/2018 "smoked when I drank"  Vaping Use  . Vaping Use: Never used  Substance and Sexual Activity  . Alcohol use: Not Currently    Alcohol/week: 0.0 standard drinks    Comment: 05/10/2018 "glass of wine on holidays"  . Drug use: Not Currently  . Sexual activity: Not Currently  Other Topics Concern  . Not on file  Social History Narrative  . Not on file   Social Determinants of Health   Financial Resource Strain: Not on file  Food Insecurity: Not on file  Transportation Needs: Not on file  Physical Activity: Not on file  Stress: Not on file  Social Connections: Not on file  Intimate Partner Violence: Not on file    FH:  Family History  Problem Relation Age of Onset  . Diabetes Mother     Past Medical History:  Diagnosis Date  . Anemia 07/17/2013  . Arthritis    "hands, shoulders, hips, legs" (05/10/2018)  . Arthritis of left hip 08/18/2013   S/p total hip arthroplasty.    . CHF (congestive heart failure) ([place])    2014...@ [facility]  . Chronic lower back pain   . CKD (chronic kidney disease) stage 3, GFR 30-59 ml/min ([facility])   . COPD (chronic obstructive pulmonary disease) ([place])   . Healthcare maintenance 06/16/2015  . High cholesterol   . Hypertension   . Influenza A 06/01/2017  . Influenza, pneumonia 06/01/2017  . Left flank pain 10/11/2016  . Myocardial infarction [facility])    "been told that I've had one; don't know when it would have been" (05/10/2018)  . Problem with sexual relationship 10/13/2017  . PVD (peripheral vascular disease) ([place])   . Shortness of breath 03/12/2019  . Type II diabetes mellitus ([place])    diagnosed 2001    Current  Outpatient Medications  Medication Sig Dispense Refill  . Accu-Chek FastClix Lancets MISC Check blood sugar up to  3x/day before meals 102 each 12  . ACCU-CHEK GUIDE test strip CHECK BLOOD SUGAR UP TO 3X/DAY BEFORE MEALS 100 strip 11  . acetaminophen (TYLENOL) 500 MG tablet Take 1,000 mg by mouth every 6 (six) hours as needed for mild pain, fever or headache.    . albuterol (PROVENTIL HFA) 108 (90 Base) MCG/ACT inhaler Inhale 2 puffs into the lungs every 6 (six) hours as needed for wheezing or shortness of breath. 18 g 0  . aspirin EC 81 MG tablet Take 1 tablet (81 mg total) by mouth daily. IM program, [facility] 30 tablet 11  . atorvastatin (LIPITOR) 80 MG tablet Take 80 mg by mouth at bedtime.    . blood glucose meter kit and supplies KIT Dispense based on patient and insurance  preference. Use up to four times daily as directed. (FOR ICD-9 250.00, 250.01). 1 each 0  . blood glucose meter kit and supplies KIT Dispense based on patient and insurance preference. Use up to four times daily as directed. (FOR ICD-9 250.00, 250.01). 1 each 0  . Blood Glucose Monitoring Suppl (ACCU-CHEK GUIDE) w/Device KIT 1 each by Does not apply route 3 (three) times daily. 1 kit 0  . carvedilol (COREG) 12.5 MG tablet TAKE 1 TABLET (12.5 MG TOTAL) BY MOUTH 2 (TWO) TIMES DAILY WITH A MEAL. 60 tablet 2  . furosemide (LASIX) 20 MG tablet Take 20 mg by mouth as needed.    . insulin glargine (LANTUS) 100 UNIT/ML Solostar Pen Inject 20 Units into the skin daily. 6 mL 0  . Insulin Pen Needle (INSUPEN PEN NEEDLES) 32G X 4 MM MISC 1 Units by Does not apply route daily. 30 each 0  . metFORMIN (GLUCOPHAGE-XR) 500 MG 24 hr tablet Take 4 tablets (2,000 mg total) by mouth daily with breakfast. 120 tablet 5  . Multiple Vitamins-Minerals (MULTIVITAMIN GUMMIES ADULT) CHEW Chew 2 tablets by mouth daily.    . nitroGLYCERIN (NITROSTAT) 0.4 MG SL tablet Place 1 tablet (0.4 mg total) under the tongue every 5 (five) minutes as needed for chest  pain. 75 tablet 2  . omeprazole (PRILOSEC) 20 MG capsule TAKE 1 CAPSULE (20 MG TOTAL) BY MOUTH DAILY. 30 capsule 2  . ticagrelor (BRILINTA) 90 MG TABS tablet Take 1 tablet (90 mg total) by mouth 2 (two) times daily. 60 tablet 0  . tiZANidine (ZANAFLEX) 4 MG tablet Take 1 tablet (4 mg total) by mouth every 6 (six) hours as needed for muscle spasms. 30 tablet 0  . UNIFINE PENTIPS 31G X 5 MM MISC USE TO INJECT 20 UNITS DAILY 100 each 1   No current facility-administered medications for this encounter.    Vitals:   06/18/20 1343  BP: 124/76  Pulse: 78  SpO2: 97%  Weight: 78.4 kg (172 lb 12.8 oz)    PHYSICAL EXAM: Cardiac: JVD flat, normal rate and rhythm, clear s1 and s2, no murmurs, rubs or gallops, no LE edema Pulmonary: CTAB, not in distress Abdominal: non distended abdomen, soft and nontender Psych: Alert, conversant, in good spirits   ASSESSMENT & PLAN: Combined Systolic and Diastolic CHF, Ischemic Cardiomyopathy: -chronic LBBB QRS just under 19m but NYHA class III may be a candidate for CRT vs CRT-D in the future depending on response to GDMT, revascularization -so far CBG's better controlled, we will continue to optimize her and get a CPX may be candidate for referral back to CT surgery -NYHA Class III symptoms -Continue carvedilol 12.565mBID, will start low dose entresto today  CAD multivessel disease: -no angina symptoms since hospitalization -s/p recent PCI to mid LAD -continue asa/brillinta, carvedilol   Poorly controlled T2DM: -recent admission with borderline DKA and glucose >600  -last A1C >15 on 06/2020 -improving, may be able to start back jardiance in the future but would continue holding for now  CKD3b: -starting entresto -repeat bmp next visit   Follow up with [facility]

## 2020-06-19 ENCOUNTER — Encounter (HOSPITAL_COMMUNITY): Payer: Self-pay

## 2020-06-22 NOTE — Progress Notes (Deleted)
Cardiology Clinic Note   Patient Name: [name] Date of Encounter: 06/22/2020  Primary Care Provider:  Marty Heck, DO Primary Cardiologist:  Sinclair Grooms, MD  Patient Profile    [name] 65 year old female presents the clinic today for follow-up of her NSTEMI status post PCI with DES x1 to mid LAD.  Past Medical History    Past Medical History:  Diagnosis Date  . Anemia 07/17/2013  . Arthritis    "hands, shoulders, hips, legs" (05/10/2018)  . Arthritis of left hip 08/18/2013   S/p total hip arthroplasty.    . CHF (congestive heart failure) ([place])    2014...@ [facility]  . Chronic lower back pain   . CKD (chronic kidney disease) stage 3, GFR 30-59 ml/min (HCC)   . COPD (chronic obstructive pulmonary disease) ([place])   . Healthcare maintenance 06/16/2015  . High cholesterol   . Hypertension   . Influenza A 06/01/2017  . Influenza, pneumonia 06/01/2017  . Left flank pain 10/11/2016  . Myocardial infarction [facility])    "been told that I've had one; don't know when it would have been" (05/10/2018)  . Problem with sexual relationship 10/13/2017  . PVD (peripheral vascular disease) ([place])   . Shortness of breath 03/12/2019  . Type II diabetes mellitus ([place])    diagnosed 2001   Past Surgical History:  Procedure Laterality Date  . ABDOMINAL ANGIOGRAM  12/28/2012  . ABDOMINAL ANGIOGRAM N/A 12/29/2011   Procedure: ABDOMINAL ANGIOGRAM;  Surgeon: Conrad [place], MD;  Location: [facility] CATH LAB;  Service: Cardiovascular;  Laterality: N/A;  . ABDOMINAL AORTAGRAM N/A 12/21/2012   Procedure: ABDOMINAL Maxcine Ham;  Surgeon: Elam Dutch, MD;  Location: [facility] CATH LAB;  Service: Cardiovascular;  Laterality: N/A;  . CORONARY STENT INTERVENTION N/A 06/11/2020   Procedure: CORONARY STENT INTERVENTION;  Surgeon: [name], MD;  Location: [place] CV LAB;  Service: Cardiovascular;  Laterality: N/A;  . EYE SURGERY Bilateral ~ 2018   "laser; related to my diabetes"  . JOINT REPLACEMENT     . PERIPHERAL VASCULAR CATHETERIZATION     "had to have some veins unclogged cause I was in pain when I walked" (05/10/2018)  . RIGHT/LEFT HEART CATH AND CORONARY ANGIOGRAPHY N/A 04/30/2018   Procedure: RIGHT/LEFT HEART CATH AND CORONARY ANGIOGRAPHY;  Surgeon: [name], MD;  Location: [place] CV LAB;  Service: Cardiovascular;  Laterality: N/A;  . RIGHT/LEFT HEART CATH AND CORONARY ANGIOGRAPHY N/A 06/09/2020   Procedure: RIGHT/LEFT HEART CATH AND CORONARY ANGIOGRAPHY;  Surgeon: [name], MD;  Location: [place] CV LAB;  Service: Cardiovascular;  Laterality: N/A;  . TOTAL HIP ARTHROPLASTY Left 08/19/2013   Procedure: TOTAL HIP ARTHROPLASTY;  Surgeon: Kerin Salen, MD;  Location: [place];  Service: Orthopedics;  Laterality: Left;  . TUBAL LIGATION      Allergies  Allergies  Allergen Reactions  . Lisinopril Cough  . Plavix [Clopidogrel Bisulfate] Rash    History of Present Illness    [name] has a PMH of combined systolic and diastolic congestive heart failure, type 2 diabetes, chronic LBBB, and coronary artery disease status post PCI with DES x1 to mid LAD.  EF 45-50% in 2014 and further reduced to 30-35% in 2015.  Her ejection fraction recovered to normal in 2018.  She was noted to have increasing dyspnea in 2019 which was accompanied with anginal symptoms.  She was noted to have a reduction in her ejection fraction that later again returned to  normal.  Echocardiogram 04/07/2018 showed reduction in her EF to 30-35%, G1 DD, mild mitral valve regurgitation, and trivial tricuspid valve regurgitation.  Cardiac catheterization 04/30/2018 showed 70-80% proximal-mid circumflex, 80% large second obtuse marginal, 80% ostial to proximal first obtuse marginal-ramus, 50% ostial LAD with mid LAD 65-70% narrowing, chronic mid RCA with collaterals left to right, normal LVEDP, EF 30-35%.  Medical management was recommended and plan for referral to cardiothoracic surgery if unable to control  symptoms.  Referral not made at that time due to only moderate disease in LAD.  Echocardiogram 2020 showed normal EF.  She was again admitted 1/20 with respiratory failure due to RSV and acute CHF.  She was managed medically but again was admitted 12/21 with COVID-19 infection.  She did not require diuresis at that time.  She was admitted 2/22 with NSTEMI.  She underwent cardiac catheterization which showed severe diffuse three-vessel coronary artery disease.  40-50 ostial LAD, 80% mid LAD, severe obstruction first obtuse marginal/ramus, 85% proximal to mid circumflex stenosis, severe diffuse stenosis of 3 obtuse marginal branches, and total occlusion of proximal-mid RCA.  LVEDP normal.  Recommendations made for CABG.  Echocardiogram 06/06/2020 showed LVEF 25-30%, G1 DD, mildly dilated left atria, pericardial effusion, and mild mitral valve regurgitation.  She was seen and evaluated by cardiothoracic surgery.  She was not felt to be a good candidate for CABG due to her comorbidities and recent COVID-19 infection.  Subsequently she underwent cardiac catheterization 06/11/2020 with PCI and DES x1 to mid LAD.  She was started on dual antiplatelet therapy.  She was seen by the heart impact clinic on 06/18/2020.  During that time she reported she goes chest pain free.  She reported she would use a cart when she went to [facility] due to her hip pain and dyspnea.  She had not really exerted herself since leaving the hospital.  She reported that her blood sugars were averaging between 100-120.  Her weight at that time was 172 pounds and stable.  She reported not needing to take any of her furosemide.  She presents the clinic today for follow-up evaluation states***  *** denies chest pain, shortness of breath, lower extremity edema, fatigue, palpitations, melena, hematuria, hemoptysis, diaphoresis, weakness, presyncope, syncope, orthopnea, and PND.   Home Medications    Prior to Admission medications    Medication Sig Start Date End Date Taking? Authorizing Provider  Accu-Chek FastClix Lancets MISC Check blood sugar up to  3x/day before meals 09/05/18   Seawell, Jaimie A, DO  ACCU-CHEK GUIDE test strip CHECK BLOOD SUGAR UP TO 3X/DAY BEFORE MEALS 11/08/19   Marianna Payment, MD  acetaminophen (TYLENOL) 500 MG tablet Take 1,000 mg by mouth every 6 (six) hours as needed for mild pain, fever or headache.    [provider]  albuterol (PROVENTIL HFA) 108 (90 Base) MCG/ACT inhaler Inhale 2 puffs into the lungs every 6 (six) hours as needed for wheezing or shortness of breath. 03/12/19   Maudie Mercury, MD  aspirin EC 81 MG tablet Take 1 tablet (81 mg total) by mouth daily. IM program, [facility] 04/22/16   Norman Herrlich, MD  atorvastatin (LIPITOR) 80 MG tablet Take 80 mg by mouth at bedtime.    [provider]  blood glucose meter kit and supplies KIT Dispense based on patient and insurance preference. Use up to four times daily as directed. (FOR ICD-9 250.00, 250.01). 02/28/18   Kathi Ludwig, MD  blood glucose meter kit and supplies KIT Dispense based  on patient and insurance preference. Use up to four times daily as directed. (FOR ICD-9 250.00, 250.01). 04/20/20   Sanjuan Dame, MD  Blood Glucose Monitoring Suppl (ACCU-CHEK GUIDE) w/Device KIT 1 each by Does not apply route 3 (three) times daily. 09/05/18   Seawell, Jaimie A, DO  carvedilol (COREG) 12.5 MG tablet TAKE 1 TABLET (12.5 MG TOTAL) BY MOUTH 2 (TWO) TIMES DAILY WITH A MEAL. 02/04/20   Lacinda Axon, MD  furosemide (LASIX) 20 MG tablet Take 20 mg by mouth as needed.    [provider]  insulin glargine (LANTUS) 100 UNIT/ML Solostar Pen Inject 20 Units into the skin daily. 06/12/20 07/12/20  Iona Beard, MD  Insulin Pen Needle (INSUPEN PEN NEEDLES) 32G X 4 MM MISC 1 Units by Does not apply route daily. 06/12/20   Iona Beard, MD  metFORMIN (GLUCOPHAGE-XR) 500 MG 24 hr tablet Take 4 tablets (2,000 mg total)  by mouth daily with breakfast. 10/24/19   Seawell, Jaimie A, DO  Multiple Vitamins-Minerals (MULTIVITAMIN GUMMIES ADULT) CHEW Chew 2 tablets by mouth daily.    [provider]  nitroGLYCERIN (NITROSTAT) 0.4 MG SL tablet Place 1 tablet (0.4 mg total) under the tongue every 5 (five) minutes as needed for chest pain. 05/15/20 08/13/20  [name], MD  omeprazole (PRILOSEC) 20 MG capsule TAKE 1 CAPSULE (20 MG TOTAL) BY MOUTH DAILY. 12/12/19   Virl Axe, MD  sacubitril-valsartan (ENTRESTO) 24-26 MG Take 1 tablet by mouth 2 (two) times daily. 06/18/20   Katherine Roan, MD  ticagrelor (BRILINTA) 90 MG TABS tablet Take 1 tablet (90 mg total) by mouth 2 (two) times daily. 06/12/20 07/12/20  Iona Beard, MD  tiZANidine (ZANAFLEX) 4 MG tablet Take 1 tablet (4 mg total) by mouth every 6 (six) hours as needed for muscle spasms. 02/17/20   Lacinda Axon, MD  UNIFINE PENTIPS 31G X 5 MM MISC USE TO INJECT 20 UNITS DAILY 04/09/19   Seawell, Laurell Roof, DO    Family History    Family History  Problem Relation Age of Onset  . Diabetes Mother    She indicated that her mother is deceased. She indicated that her father is deceased. She indicated that her maternal grandmother is deceased. She indicated that her maternal grandfather is deceased. She indicated that her paternal grandmother is deceased. She indicated that her paternal grandfather is deceased.  Social History    Social History   Socioeconomic History  . Marital status: Married    Spouse name: Not on file  . Number of children: Not on file  . Years of education: 12,college  . Highest education level: Not on file  Occupational History  . Not on file  Tobacco Use  . Smoking status: Former Smoker    Years: 7.00    Types: Cigarettes    Quit date: 05/08/1987    Years since quitting: 33.1  . Smokeless tobacco: Never Used  . Tobacco comment: 05/10/2018 "smoked when I drank"  Vaping Use  . Vaping Use: Never used  Substance and  Sexual Activity  . Alcohol use: Not Currently    Alcohol/week: 0.0 standard drinks    Comment: 05/10/2018 "glass of wine on holidays"  . Drug use: Not Currently  . Sexual activity: Not Currently  Other Topics Concern  . Not on file  Social History Narrative  . Not on file   Social Determinants of Health   Financial Resource Strain: Not on file  Food Insecurity: Not on file  Transportation Needs: Not on file  Physical Activity: Not on file  Stress: Not on file  Social Connections: Not on file  Intimate Partner Violence: Not on file     Review of Systems    General:  No chills, fever, night sweats or weight changes.  Cardiovascular:  No chest pain, dyspnea on exertion, edema, orthopnea, palpitations, paroxysmal nocturnal dyspnea. Dermatological: No rash, lesions/masses Respiratory: No cough, dyspnea Urologic: No hematuria, dysuria Abdominal:   No nausea, vomiting, diarrhea, bright red blood per rectum, melena, or hematemesis Neurologic:  No visual changes, wkns, changes in mental status. All other systems reviewed and are otherwise negative except as noted above.  Physical Exam    VS:  There were no vitals taken for this visit. , BMI There is no height or weight on file to calculate BMI. GEN: Well nourished, well developed, in no acute distress. HEENT: normal. Neck: Supple, no JVD, carotid bruits, or masses. Cardiac: RRR, no murmurs, rubs, or gallops. No clubbing, cyanosis, edema.  Radials/DP/PT 2+ and equal bilaterally.  Respiratory:  Respirations regular and unlabored, clear to auscultation bilaterally. GI: Soft, nontender, nondistended, BS + x 4. MS: no deformity or atrophy. Skin: warm and dry, no rash. Neuro:  Strength and sensation are intact. Psych: Normal affect.  Accessory Clinical Findings    Recent Labs: 04/18/2020: Magnesium 1.9 06/06/2020: ALT 14; B Natriuretic Peptide 953.9 06/12/2020: BUN 11; Creatinine, Ser 1.52; Hemoglobin 10.9; Platelets 184; Potassium  4.1; Sodium 137   Recent Lipid Panel    Component Value Date/Time   CHOL 166 06/09/2020 0320   CHOL 149 08/31/2017 0941   TRIG 128 06/09/2020 0320   HDL 41 06/09/2020 0320   HDL 60 08/31/2017 0941   CHOLHDL 4.0 06/09/2020 0320   VLDL 26 06/09/2020 0320   LDLCALC 99 06/09/2020 0320   LDLCALC 74 08/31/2017 0941    ECG personally reviewed by me today- *** - No acute changes  Echocardiogram 06/06/2020 IMPRESSIONS    1. When compared to the prior study from 09/13/2018 LVEF has decreased  55-60% to 25-30% with diffuse hypokinesis.  2. Left ventricular ejection fraction, by estimation, is 25 to 30%. The  left ventricle has severely decreased function. The left ventricle  demonstrates global hypokinesis. There is severe concentric left  ventricular hypertrophy. Left ventricular  diastolic parameters are consistent with Grade I diastolic dysfunction  (impaired relaxation). Elevated left ventricular end-diastolic pressure.  3. Right ventricular systolic function is normal. The right ventricular  size is normal.  4. Left atrial size was mildly dilated.  5. The pericardial effusion is posterior to the left ventricle.  6. The mitral valve is normal in structure. Mild mitral valve  regurgitation. No evidence of mitral stenosis.  7. The aortic valve is normal in structure. There is moderate  calcification of the aortic valve. There is moderate thickening of the  aortic valve. Aortic valve regurgitation is mild. Mild to moderate aortic  valve sclerosis/calcification is present,  without any evidence of aortic stenosis.  8. The inferior vena cava is normal in size with greater than 50%  respiratory variability, suggesting right atrial pressure of 3 mmHg.  Cardiac catheterization 06/11/2020  80% eccentric mid LAD stenosis with TIMI grade III flow reduced to 0% with TIMI grade III flow using a 15 x 2.5 Onyx postdilated to 2.75 mm with high-pressure [place] balloon.  No complications  including absence of angina and hemodynamic disturbance during balloon inflations.  RECOMMENDATIONS:   Aspirin and Brilinta for 12 months. After 12  months consider long-term P2 Y 12 inhibitor monotherapy either Plavix or reduced dose Brilinta.  Diagnostic Dominance: Right    Intervention       Assessment & Plan   1.  Coronary artery disease-no chest pain today.  Underwent cardiac catheterization 06/09/2020 and 06/11/2020.  Was noted to have diffuse three-vessel coronary artery disease and received DES x1 to her mid LAD.  CVTS consulted, she was not felt to be a good candidate for CABG. Continue aspirin, atorvastatin, furosemide, carvedilol, Brilinta Heart healthy low-sodium diet-salty 6 given Increase physical activity as tolerated  Acute combined systolic and diastolic CHF/ICM-no increased DOE or activity intolerance.  Echocardiogram 06/06/2020 showed an EF of 25-30%.  Started on Entresto 24/26 06/18/2020. Continue Entresto, carvedilol Order BMP Recommend repeat echocardiogram once medication optimized after 1 month. Follows with advanced heart failure clinic.  CKD stage III-creatinine 1.52 on 06/12/2020. Repeat BMP  Diabetes mellitus type 2/DKA -A1c greater than 17.  Glucose 06/12/2020 148. Managed by PCP  Disposition: Follow-up with Dr. Tamala Julian and advanced heart failure clinic as scheduled.  Jossie Ng. Mennie Spiller NP-C    06/22/2020, 6:36 AM [address] Office [phone] Fax [phone]  Notice: This dictation was prepared with Dragon dictation along with smaller phrase technology. Any transcriptional errors that result from this process are unintentional and may not be corrected upon review.  I spent***minutes examining this patient, reviewing medications, and using patient centered shared decision making involving her cardiac care.  Prior to her visit I spent greater than 20 minutes reviewing her past medical history,   medications, and prior cardiac tests.

## 2020-06-23 ENCOUNTER — Ambulatory Visit: Payer: Medicaid Other | Admitting: General Practice

## 2020-06-24 ENCOUNTER — Other Ambulatory Visit: Payer: Self-pay | Admitting: Internal Medicine

## 2020-06-24 ENCOUNTER — Telehealth (HOSPITAL_COMMUNITY): Payer: Self-pay

## 2020-06-24 ENCOUNTER — Telehealth (HOSPITAL_COMMUNITY): Payer: Self-pay | Admitting: *Deleted

## 2020-06-24 ENCOUNTER — Other Ambulatory Visit (HOSPITAL_COMMUNITY): Payer: Self-pay | Admitting: Student

## 2020-06-24 MED FILL — PROAIR HFA 90 MCG INHALER: 108 (90 BAS | 25 days supply | Qty: 9 | Fill #0

## 2020-06-24 NOTE — Telephone Encounter (Signed)
Pt left vm stating since starting entresto shes had nonstop vomiting. Per Dr.Winfrey pt may hold entresto one day to see if it is truly the medication causing GI upset. Pt needs to call Friday and give update. I called pt she did not answer I left vm requesting return call.

## 2020-06-24 NOTE — Telephone Encounter (Signed)
Pharmacy Transitions of Care Follow-up Telephone Call  Date of discharge: 06/12/20 Discharge Diagnosis: NSTEMI  How have you been since you were released from the hospital? Patient has been doing well, but was just started on entresto this week and she's been nauseous since last night. Looked over her meds and didn't see any interactions to cause this. She has already called her doctor's office and is waiting to hear back. Knows s/sx of bleeding to look out for.  Medication changes made at discharge: yes  Medication changes obtained and verified? yes    Medication Accessibility:  Home Pharmacy: [facility]  Was the patient provided with refills on discharged medications? no  Have all prescriptions been transferred from [facility] to home pharmacy? n/a  . Is the patient able to afford medications? yes    Medication Review:  TICAGRELOR (BRILINTA) Ticagrelor 90 mg BID initiated on 06/12/20.  - Discussed importance of taking medication around the same time every day, - Emphasized importance of monitoring for signs and symptoms of bleeding (abnormal bruising, prolonged bleeding, nose bleeds, bleeding from gums, discolored urine, black tarry stools)  - Educated patient to notify doctor if shortness of breath or abnormal heartbeat occur - Advised patient to alert all providers of antiplatelet therapy prior to starting a new medication or having a procedure   Follow-up Appointments:  She sees Dr. Haroldine Laws in Cardiology on 07/09/20, will receive refills at this follow up.   If their condition worsens, is the pt aware to call PCP or go to the Emergency Dept.? yes  Final Patient Assessment: Patient has been handling medications well. Possibly having issue with Delene Loll but is currently reaching out to MD for talks about this. Has follow ups scheduled and knows to get refills at that visit.

## 2020-06-26 ENCOUNTER — Other Ambulatory Visit: Payer: Medicaid Other

## 2020-06-26 DIAGNOSIS — Z20822 Contact with and (suspected) exposure to covid-19: Secondary | ICD-10-CM

## 2020-06-27 LAB — SARS-COV-2, NAA 2 DAY TAT

## 2020-06-27 LAB — NOVEL CORONAVIRUS, NAA: SARS-CoV-2, NAA: NOT DETECTED

## 2020-06-30 ENCOUNTER — Telehealth: Payer: Self-pay | Admitting: General Practice

## 2020-06-30 DIAGNOSIS — U071 COVID-19: Secondary | ICD-10-CM | POA: Diagnosis not present

## 2020-06-30 NOTE — Telephone Encounter (Signed)
Patient called to get COVID results. Made her aware they were negative.

## 2020-07-03 MED FILL — METFORMIN HCL ER 500 MG TB2: 500 | 30 days supply | Qty: 120 | Fill #2

## 2020-07-09 ENCOUNTER — Other Ambulatory Visit (HOSPITAL_COMMUNITY): Payer: Self-pay | Admitting: Internal Medicine

## 2020-07-09 ENCOUNTER — Encounter (HOSPITAL_COMMUNITY): Payer: Self-pay | Admitting: Internal Medicine

## 2020-07-09 ENCOUNTER — Ambulatory Visit: Payer: Medicaid Other | Admitting: Interventional Cardiology

## 2020-07-09 ENCOUNTER — Ambulatory Visit (HOSPITAL_COMMUNITY)
Admission: RE | Admit: 2020-07-09 | Discharge: 2020-07-09 | Disposition: A | Discharge status: Home / Self Care | Payer: Medicaid Other | Source: Ambulatory Visit | Attending: Internal Medicine | Admitting: Internal Medicine

## 2020-07-09 ENCOUNTER — Other Ambulatory Visit: Payer: Self-pay

## 2020-07-09 VITALS — BP 120/70 | HR 83 | Wt 168.2 lb

## 2020-07-09 DIAGNOSIS — I2510 Atherosclerotic heart disease of native coronary artery without angina pectoris: Secondary | ICD-10-CM | POA: Diagnosis not present

## 2020-07-09 DIAGNOSIS — I130 Hypertensive heart and chronic kidney disease with heart failure and stage 1 through stage 4 chronic kidney disease, or unspecified chronic kidney disease: Secondary | ICD-10-CM | POA: Insufficient documentation

## 2020-07-09 DIAGNOSIS — Z7984 Long term (current) use of oral hypoglycemic drugs: Secondary | ICD-10-CM | POA: Diagnosis not present

## 2020-07-09 DIAGNOSIS — I5042 Chronic combined systolic (congestive) and diastolic (congestive) heart failure: Secondary | ICD-10-CM

## 2020-07-09 DIAGNOSIS — Z955 Presence of coronary angioplasty implant and graft: Secondary | ICD-10-CM | POA: Insufficient documentation

## 2020-07-09 DIAGNOSIS — N1832 Chronic kidney disease, stage 3b: Secondary | ICD-10-CM

## 2020-07-09 DIAGNOSIS — E1165 Type 2 diabetes mellitus with hyperglycemia: Secondary | ICD-10-CM | POA: Diagnosis not present

## 2020-07-09 DIAGNOSIS — Z7982 Long term (current) use of aspirin: Secondary | ICD-10-CM | POA: Diagnosis not present

## 2020-07-09 DIAGNOSIS — E1151 Type 2 diabetes mellitus with diabetic peripheral angiopathy without gangrene: Secondary | ICD-10-CM | POA: Insufficient documentation

## 2020-07-09 DIAGNOSIS — N1831 Chronic kidney disease, stage 3a: Secondary | ICD-10-CM

## 2020-07-09 DIAGNOSIS — E1122 Type 2 diabetes mellitus with diabetic chronic kidney disease: Secondary | ICD-10-CM | POA: Diagnosis not present

## 2020-07-09 DIAGNOSIS — I5043 Acute on chronic combined systolic (congestive) and diastolic (congestive) heart failure: Secondary | ICD-10-CM | POA: Diagnosis not present

## 2020-07-09 DIAGNOSIS — Z794 Long term (current) use of insulin: Secondary | ICD-10-CM | POA: Insufficient documentation

## 2020-07-09 DIAGNOSIS — Z87891 Personal history of nicotine dependence: Secondary | ICD-10-CM | POA: Diagnosis not present

## 2020-07-09 DIAGNOSIS — I447 Left bundle-branch block, unspecified: Secondary | ICD-10-CM | POA: Diagnosis not present

## 2020-07-09 DIAGNOSIS — I252 Old myocardial infarction: Secondary | ICD-10-CM | POA: Diagnosis not present

## 2020-07-09 DIAGNOSIS — Z888 Allergy status to other drugs, medicaments and biological substances status: Secondary | ICD-10-CM | POA: Insufficient documentation

## 2020-07-09 DIAGNOSIS — Z79899 Other long term (current) drug therapy: Secondary | ICD-10-CM | POA: Diagnosis not present

## 2020-07-09 LAB — HEMOGLOBIN A1C
Hgb A1c MFr Bld: 11.9 % — ABNORMAL HIGH (ref 4.8–5.6)
Mean Plasma Glucose: 294.83 mg/dL

## 2020-07-09 LAB — BASIC METABOLIC PANEL
Anion gap: 9 (ref 5–15)
BUN: 20 mg/dL (ref 8–23)
CO2: 24 mmol/L (ref 22–32)
Calcium: 9.2 mg/dL (ref 8.9–10.3)
Chloride: 112 mmol/L — ABNORMAL HIGH (ref 98–111)
Creatinine, Ser: 1.61 mg/dL — ABNORMAL HIGH (ref 0.44–1.00)
GFR, Estimated: 36 mL/min — ABNORMAL LOW (ref >60)
Glucose, Bld: 92 mg/dL (ref 70–99)
Potassium: 4.3 mmol/L (ref 3.5–5.1)
Sodium: 145 mmol/L (ref 135–145)

## 2020-07-09 LAB — BRAIN NATRIURETIC PEPTIDE: B Natriuretic Peptide: 696 pg/mL — ABNORMAL HIGH (ref 0.0–100.0)

## 2020-07-09 MED ORDER — SPIRONOLACTONE 25 MG PO TABS
12.5000 mg | ORAL_TABLET | Freq: Every day | ORAL | 3 refills | Status: DC
Start: 2020-07-09 — End: 2021-01-11

## 2020-07-09 MED ORDER — ENTRESTO 49-51 MG PO TABS: 1.0000 | ORAL_TABLET | Freq: Two times a day (BID) | ORAL | 11 refills | Status: DC

## 2020-07-09 MED FILL — SPIRONOLACTONE 25 MG TABS: 25 | 90 days supply | Qty: 45 | Fill #0

## 2020-07-09 MED FILL — ENTRESTO 49 MG-51 MG TABLET: 49-51 | 30 days supply | Qty: 60 | Fill #0

## 2020-07-09 NOTE — Patient Instructions (Addendum)
RedsClip Reading done today.  Labs done today. We will contact you only if your labs are abnormal.  INCREASE Entresto to 49-'51mg'$  (1 tablet) by mouth 2 times daily.  START Spironolactone 12.'5mg'$  (1/2 tablet) by mouth daily  No other medication changes were made. Please continue all current medications as prescribed.  You have been referred to [facility]. They will contact you to schedule an appointment.    Your physician recommends that you schedule a follow-up appointment in: 2 weeks for an appointment with our Clinic Pharmacist, Lauren (we will contact you to schedule this appointment) and in 4 months with Dr. Haroldine Laws. Please contact our office in June to schedule a July appointment.   If you have any questions or concerns before your next appointment please send [place] a message through [facility] or call our office at [phone].    TO LEAVE A MESSAGE FOR THE NURSE SELECT OPTION 2, PLEASE LEAVE A MESSAGE INCLUDING: . YOUR NAME . DATE OF BIRTH . CALL BACK NUMBER . REASON FOR CALL**this is important as we prioritize the call backs  YOU WILL RECEIVE A CALL BACK THE SAME DAY AS LONG AS YOU CALL BEFORE 4:00 PM   Do the following things EVERYDAY: 1) Weigh yourself in the morning before breakfast. Write it down and keep it in a log. 2) Take your medicines as prescribed 3) Eat low salt foods--Limit salt (sodium) to 2000 mg per day.  4) Stay as active as you can everyday 5) Limit all fluids for the day to less than 2 liters   At the [facility], you and your health needs are our priority. As part of our continuing mission to provide you with exceptional heart care, we have created designated Provider Care Teams. These Care Teams include your primary Cardiologist (physician) and Advanced Practice Providers (APPs- Physician Assistants and Nurse Practitioners) who all work together to provide you with the care you need, when you need it.   You may see any of  the following providers on your designated Care Team at your next follow up: Marland Kitchen Dr Glori Bickers . Dr Loralie Champagne . Darrick Grinder, NP . Lyda Jester, PA . Audry Riles, PharmD   Please be sure to bring in all your medications bottles to every appointment.

## 2020-07-09 NOTE — Progress Notes (Signed)
ReDS Vest / Clip - 07/09/20 1700      ReDS Vest / Clip   Station Marker A    Ruler Value 26.5    ReDS Value Range Moderate volume overload    ReDS Actual Value 40

## 2020-07-09 NOTE — Progress Notes (Addendum)
ADVANCED HF CLINIC CONSULT NOTE  Referring Provider: Odie Sera NP-C Primary Care: Marty Heck, DO Primary Cardiologist: Sinclair Grooms, MD   HPI:  [name] is a 65 y.o. woman with poorly-controlled DM2, PAD, CKD 3b, LBBB, multivessel disease CAD s/p  PCI to mid LAD referred by Odie Sera, NP for further evalaution of her HF.   Followed by Cardiology since around 3953 for systolic and diastolic CHF and PVD.  EF 45-50% 2014, 30-35% in 2015  EF recovered to normal in 2018.  She had been doing well until around 2019.  She had worsening dypsnea and anginal symptoms.  Her EF went back down in early 2019 and back up to normal 3 months later did okay  Admitted in 04/2018 with ADHF. EF 30-35%. Referred for cath    Severe diffuse involvement of the circumflex with 70 to 80% proximal to mid stenosis, diffuse 80% and a large second obtuse marginal, and segmental 80% stenosis in the ostial to proximal first obtuse marginal/ramus.  50% ostial LAD with mid vessel eccentric 65 to 70% narrowing.  Managed Medically and ECHO repeated in 5/20 with EF 55-60%  Admitted for NSTEMI on 06/2020. Echo EF 25-30% She had a repeat LHC with results below:   Severe diffuse three-vessel coronary artery disease.  40 to 50% ostial LAD, 85% mid LAD.  Severe obstruction first obtuse marginal/ramus intermedius.  85% proximal to mid circumflex stenosis. Severe diffuse disease in each of 3 obtuse marginal branches.  Total occlusion of the proximal to mid RCA.  LVEDP is normal. Left ventriculography was not performed because of renal insufficiency.   CT surgery was consulted but it was felt she was not a good surgical candidate due to her recent covid 19 infection and her other comorbidities including poorly controlled T2DM.  She underwent PCI to mid LAD and started on DAPT.    Says she is short of breath with very mild exertion. Very tired. Occasional chest tightness for which she  takes sl NTG. Weight coming down slowly. Blood sugars getting better.    Review of Systems: [y] = yes, '[ ]'  = no   General: Weight gain '[ ]' ; Weight loss '[ ]' ; Anorexia '[ ]' ; Fatigue Blue.Reese ]; Fever '[ ]' ; Chills '[ ]' ; Weakness [ y]  Cardiac: Chest pain/pressure Blue.Reese ]; Resting SOB '[ ]' ; Exertional SOB Blue.Reese ]; Orthopnea '[ ]' ; Pedal Edema '[ ]' ; Palpitations '[ ]' ; Syncope '[ ]' ; Presyncope '[ ]' ; Paroxysmal nocturnal dyspnea'[ ]'   Pulmonary: Cough '[ ]' ; Wheezing'[ ]' ; Hemoptysis'[ ]' ; Sputum '[ ]' ; Snoring '[ ]'   GI: Vomiting'[ ]' ; Dysphagia'[ ]' ; Melena'[ ]' ; Hematochezia '[ ]' ; Heartburn'[ ]' ; Abdominal pain '[ ]' ; Constipation '[ ]' ; Diarrhea '[ ]' ; BRBPR '[ ]'   GU: Hematuria'[ ]' ; Dysuria '[ ]' ; Nocturia'[ ]'   Vascular: Pain in legs with walking '[ ]' ; Pain in feet with lying flat '[ ]' ; Non-healing sores '[ ]' ; Stroke '[ ]' ; TIA '[ ]' ; Slurred speech '[ ]' ;  Neuro: Headaches'[ ]' ; Vertigo'[ ]' ; Seizures'[ ]' ; Paresthesias'[ ]' ;Blurred vision '[ ]' ; Diplopia '[ ]' ; Vision changes '[ ]'   Ortho/Skin: Arthritis Blue.Reese ]; Joint pain [ y]; Muscle pain '[ ]' ; Joint swelling '[ ]' ; Back Pain '[ ]' ; Rash '[ ]'   Psych: Depression'[ ]' ; Anxiety'[ ]'   Heme: Bleeding problems '[ ]' ; Clotting disorders '[ ]' ; Anemia '[ ]'   Endocrine: Diabetes Blue.Reese ]; Thyroid dysfunction'[ ]'    Past Medical History:  Diagnosis Date   Anemia 07/17/2013   Arthritis    "hands,  shoulders, hips, legs" (05/10/2018)   Arthritis of left hip 08/18/2013   S/p total hip arthroplasty.     CHF (congestive heart failure) ([place])    2014...@ [facility]   Chronic lower back pain    CKD (chronic kidney disease) stage 3, GFR 30-59 ml/min (HCC)    COPD (chronic obstructive pulmonary disease) ([place])    Healthcare maintenance 06/16/2015   High cholesterol    Hypertension    Influenza A 06/01/2017   Influenza, pneumonia 06/01/2017   Left flank pain 10/11/2016   Myocardial infarction [facility])    "been told that I've had one; don't know when it would have been" (05/10/2018)   Problem with sexual relationship 10/13/2017   PVD (peripheral  vascular disease) ([place])    Shortness of breath 03/12/2019   Type II diabetes mellitus ([place])    diagnosed 2001    Current Outpatient Medications  Medication Sig Dispense Refill   Accu-Chek FastClix Lancets MISC Check blood sugar up to  3x/day before meals 102 each 12   ACCU-CHEK GUIDE test strip CHECK BLOOD SUGAR UP TO 3X/DAY BEFORE MEALS 100 strip 11   acetaminophen (TYLENOL) 500 MG tablet Take 1,000 mg by mouth every 6 (six) hours as needed for mild pain, fever or headache.     albuterol (VENTOLIN HFA) 108 (90 Base) MCG/ACT inhaler INHALE 2 PUFFS INTO THE LUNGS EVERY 6 (SIX) HOURS AS NEEDED FOR WHEEZING OR SHORTNESS OF BREATH. 8.5 g 0   aspirin EC 81 MG tablet Take 1 tablet (81 mg total) by mouth daily. IM program, hope fund 30 tablet 11   atorvastatin (LIPITOR) 80 MG tablet Take 80 mg by mouth at bedtime.     blood glucose meter kit and supplies KIT Dispense based on patient and insurance preference. Use up to four times daily as directed. (FOR ICD-9 250.00, 250.01). 1 each 0   blood glucose meter kit and supplies KIT Dispense based on patient and insurance preference. Use up to four times daily as directed. (FOR ICD-9 250.00, 250.01). 1 each 0   Blood Glucose Monitoring Suppl (ACCU-CHEK GUIDE) w/Device KIT 1 each by Does not apply route 3 (three) times daily. 1 kit 0   carvedilol (COREG) 12.5 MG tablet TAKE 1 TABLET (12.5 MG TOTAL) BY MOUTH 2 (TWO) TIMES DAILY WITH A MEAL. 60 tablet 2   furosemide (LASIX) 20 MG tablet Take 20 mg by mouth as needed.     insulin glargine (LANTUS) 100 UNIT/ML Solostar Pen Inject 20 Units into the skin daily. 6 mL 0   Insulin Pen Needle (INSUPEN PEN NEEDLES) 32G X 4 MM MISC 1 Units by Does not apply route daily. 30 each 0   isosorbide mononitrate (IMDUR) 30 MG 24 hr tablet Take 30 mg by mouth daily.     metFORMIN (GLUCOPHAGE-XR) 500 MG 24 hr tablet Take 4 tablets (2,000 mg total) by mouth daily with breakfast. 120 tablet 5   Multiple  Vitamins-Minerals (MULTIVITAMIN GUMMIES ADULT) CHEW Chew 2 tablets by mouth daily.     nitroGLYCERIN (NITROSTAT) 0.4 MG SL tablet Place 1 tablet (0.4 mg total) under the tongue every 5 (five) minutes as needed for chest pain. 75 tablet 2   omeprazole (PRILOSEC) 20 MG capsule TAKE 1 CAPSULE (20 MG TOTAL) BY MOUTH DAILY. 30 capsule 2   sacubitril-valsartan (ENTRESTO) 24-26 MG Take 1 tablet by mouth 2 (two) times daily. 60 tablet 3   ticagrelor (BRILINTA) 90 MG TABS tablet Take 1 tablet (90 mg total) by mouth 2 (two)  times daily. 60 tablet 0   tiZANidine (ZANAFLEX) 4 MG tablet Take 1 tablet (4 mg total) by mouth every 6 (six) hours as needed for muscle spasms. 30 tablet 0   UNIFINE PENTIPS 31G X 5 MM MISC USE TO INJECT 20 UNITS DAILY 100 each 1   No current facility-administered medications for this encounter.    Allergies  Allergen Reactions   Lisinopril Cough   Plavix [Clopidogrel Bisulfate] Rash      Social History   Socioeconomic History   Marital status: Married    Spouse name: Not on file   Number of children: Not on file   Years of education: 12,college   Highest education level: Not on file  Occupational History   Not on file  Tobacco Use   Smoking status: Former Smoker    Years: 7.00    Types: Cigarettes    Quit date: 05/08/1987    Years since quitting: 33.1   Smokeless tobacco: Never Used   Tobacco comment: 05/10/2018 "smoked when I drank"  Vaping Use   Vaping Use: Never used  Substance and Sexual Activity   Alcohol use: Not Currently    Alcohol/week: 0.0 standard drinks    Comment: 05/10/2018 "glass of wine on holidays"   Drug use: Not Currently   Sexual activity: Not Currently  Other Topics Concern   Not on file  Social History Narrative   Not on file   Social Determinants of Health   Financial Resource Strain: Not on file  Food Insecurity: Not on file  Transportation Needs: Not on file  Physical Activity: Not on file  Stress: Not on  file  Social Connections: Not on file  Intimate Partner Violence: Not on file      Family History  Problem Relation Age of Onset   Diabetes Mother     Vitals:   07/09/20 1558  BP: 120/70  Pulse: 83  SpO2: 97%  Weight: 76.3 kg (168 lb 3.2 oz)    PHYSICAL EXAM: General:  Well appearing. No respiratory difficulty HEENT: normal Neck: supple. no JVD. Carotids 2+ bilat; no bruits. No lymphadenopathy or thryomegaly appreciated. Cor: PMI nondisplaced. Regular rate & rhythm. No rubs, gallops or murmurs. Lungs: clear Abdomen: soft, nontender, nondistended. No hepatosplenomegaly. No bruits or masses. Good bowel sounds. Extremities: no cyanosis, clubbing, rash, edema Neuro: alert & oriented x 3, cranial nerves grossly intact. moves all 4 extremities w/o difficulty. Affect pleasant.   ASSESSMENT & PLAN:   1. Acute combined systolic and diastolic CHF/ICM - EF has been up and down over the years - Echo 06/06/2020 showed an EF of 25-30%.   - NYHA II-III - Volume status mildly elevated. REDs 40% - Increase Entresto to 49/51 bid - Continue carvedilol 12.5 bid - Start spiro 12.5 - Previously on Jardiance 25 daily but on hold with recent A1c at 17. Restart when A1c back down - Refer to HF pharmD for help with med titration  - F/u echo in 2-3 months  - Labs today  2. Coronary artery disease - cardiac catheterization 06/09/2020 and 06/11/2020.  Was noted to have diffuse three-vessel CAD  with DES x1 to her mid LAD.  CVTS consulted, she was not felt to be a good candidate for CABG. - occasional CP managed with NTG. Stable pattern - followed by Dr. Tamala Julian - continue DAPT/statin  - refer CR once done with HHPT  3. CKD stage IIIb - creatinine 1.52 on 06/12/2020. - recheck today - Previously on Jardiance 25 daily  but on hold with recent A1c at 17. Restart when A1c back down  4. Diabetes mellitus type 2/DKA - Recent  A1c greater than 17.   - managed by PCP. Check A1c today.  - restart  SGLT2i when A1c < 12  Glori Bickers, MD  5:14 PM

## 2020-07-16 ENCOUNTER — Other Ambulatory Visit: Payer: Self-pay | Admitting: Internal Medicine

## 2020-07-16 ENCOUNTER — Other Ambulatory Visit (HOSPITAL_COMMUNITY): Payer: Self-pay | Admitting: Internal Medicine

## 2020-07-16 DIAGNOSIS — K219 Gastro-esophageal reflux disease without esophagitis: Secondary | ICD-10-CM

## 2020-07-16 MED ORDER — INSULIN GLARGINE 100 UNIT/ML SOLOSTAR PEN: 20.0000 [IU] | PEN_INJECTOR | Freq: Every day | SUBCUTANEOUS | 0 refills | Status: DC

## 2020-07-21 ENCOUNTER — Other Ambulatory Visit: Payer: Self-pay | Admitting: Student

## 2020-07-21 ENCOUNTER — Other Ambulatory Visit (HOSPITAL_COMMUNITY): Payer: Self-pay | Admitting: Internal Medicine

## 2020-07-21 DIAGNOSIS — I214 Non-ST elevation (NSTEMI) myocardial infarction: Secondary | ICD-10-CM

## 2020-07-21 MED FILL — BRILINTA 90 MG TABLET: 90 | 30 days supply | Qty: 60 | Fill #0

## 2020-07-21 NOTE — Telephone Encounter (Signed)
Return pt's call; no answer - left message to call the office. 

## 2020-07-21 NOTE — Telephone Encounter (Signed)
Pt calling regarding medicine [phone]

## 2020-07-27 ENCOUNTER — Inpatient Hospital Stay (HOSPITAL_COMMUNITY)
Admission: RE | Admit: 2020-07-27 | Discharge: 2020-07-27 | Disposition: A | Discharge status: Home / Self Care | Payer: Medicaid Other | Source: Ambulatory Visit

## 2020-07-30 ENCOUNTER — Encounter: Payer: Medicaid Other | Admitting: Internal Medicine

## 2020-07-31 DIAGNOSIS — U071 COVID-19: Secondary | ICD-10-CM | POA: Diagnosis not present

## 2020-08-02 ENCOUNTER — Observation Stay (HOSPITAL_COMMUNITY)
Admission: EM | Admit: 2020-08-02 | Discharge: 2020-08-03 | DRG: 304 | Disposition: A | Discharge status: Home / Self Care | Payer: Medicaid Other | Attending: Internal Medicine | Admitting: Internal Medicine

## 2020-08-02 ENCOUNTER — Emergency Department (HOSPITAL_COMMUNITY): Payer: Medicaid Other

## 2020-08-02 ENCOUNTER — Other Ambulatory Visit: Payer: Self-pay

## 2020-08-02 ENCOUNTER — Encounter (HOSPITAL_COMMUNITY): Payer: Self-pay | Admitting: Internal Medicine

## 2020-08-02 DIAGNOSIS — E1165 Type 2 diabetes mellitus with hyperglycemia: Secondary | ICD-10-CM | POA: Diagnosis present

## 2020-08-02 DIAGNOSIS — R61 Generalized hyperhidrosis: Secondary | ICD-10-CM | POA: Diagnosis not present

## 2020-08-02 DIAGNOSIS — I252 Old myocardial infarction: Secondary | ICD-10-CM | POA: Diagnosis not present

## 2020-08-02 DIAGNOSIS — I255 Ischemic cardiomyopathy: Secondary | ICD-10-CM | POA: Diagnosis present

## 2020-08-02 DIAGNOSIS — J9601 Acute respiratory failure with hypoxia: Secondary | ICD-10-CM | POA: Diagnosis present

## 2020-08-02 DIAGNOSIS — I5042 Chronic combined systolic (congestive) and diastolic (congestive) heart failure: Secondary | ICD-10-CM | POA: Diagnosis not present

## 2020-08-02 DIAGNOSIS — E1122 Type 2 diabetes mellitus with diabetic chronic kidney disease: Secondary | ICD-10-CM | POA: Diagnosis not present

## 2020-08-02 DIAGNOSIS — E7800 Pure hypercholesterolemia, unspecified: Secondary | ICD-10-CM | POA: Diagnosis not present

## 2020-08-02 DIAGNOSIS — N1832 Chronic kidney disease, stage 3b: Secondary | ICD-10-CM | POA: Diagnosis present

## 2020-08-02 DIAGNOSIS — Z833 Family history of diabetes mellitus: Secondary | ICD-10-CM | POA: Diagnosis not present

## 2020-08-02 DIAGNOSIS — Z7982 Long term (current) use of aspirin: Secondary | ICD-10-CM

## 2020-08-02 DIAGNOSIS — Z8616 Personal history of COVID-19: Secondary | ICD-10-CM

## 2020-08-02 DIAGNOSIS — I2510 Atherosclerotic heart disease of native coronary artery without angina pectoris: Secondary | ICD-10-CM | POA: Diagnosis not present

## 2020-08-02 DIAGNOSIS — R231 Pallor: Secondary | ICD-10-CM | POA: Diagnosis not present

## 2020-08-02 DIAGNOSIS — R0902 Hypoxemia: Secondary | ICD-10-CM | POA: Diagnosis not present

## 2020-08-02 DIAGNOSIS — E1151 Type 2 diabetes mellitus with diabetic peripheral angiopathy without gangrene: Secondary | ICD-10-CM | POA: Diagnosis not present

## 2020-08-02 DIAGNOSIS — I130 Hypertensive heart and chronic kidney disease with heart failure and stage 1 through stage 4 chronic kidney disease, or unspecified chronic kidney disease: Secondary | ICD-10-CM | POA: Diagnosis present

## 2020-08-02 DIAGNOSIS — Z79899 Other long term (current) drug therapy: Secondary | ICD-10-CM | POA: Diagnosis not present

## 2020-08-02 DIAGNOSIS — Z7902 Long term (current) use of antithrombotics/antiplatelets: Secondary | ICD-10-CM | POA: Diagnosis not present

## 2020-08-02 DIAGNOSIS — Z96642 Presence of left artificial hip joint: Secondary | ICD-10-CM | POA: Diagnosis present

## 2020-08-02 DIAGNOSIS — R0689 Other abnormalities of breathing: Secondary | ICD-10-CM | POA: Diagnosis not present

## 2020-08-02 DIAGNOSIS — Z794 Long term (current) use of insulin: Secondary | ICD-10-CM | POA: Diagnosis not present

## 2020-08-02 DIAGNOSIS — Z955 Presence of coronary angioplasty implant and graft: Secondary | ICD-10-CM

## 2020-08-02 DIAGNOSIS — R0602 Shortness of breath: Secondary | ICD-10-CM | POA: Diagnosis not present

## 2020-08-02 DIAGNOSIS — I447 Left bundle-branch block, unspecified: Secondary | ICD-10-CM | POA: Diagnosis present

## 2020-08-02 DIAGNOSIS — Z87891 Personal history of nicotine dependence: Secondary | ICD-10-CM | POA: Diagnosis not present

## 2020-08-02 DIAGNOSIS — Z7984 Long term (current) use of oral hypoglycemic drugs: Secondary | ICD-10-CM | POA: Diagnosis not present

## 2020-08-02 DIAGNOSIS — I161 Hypertensive emergency: Secondary | ICD-10-CM

## 2020-08-02 DIAGNOSIS — E785 Hyperlipidemia, unspecified: Secondary | ICD-10-CM | POA: Diagnosis present

## 2020-08-02 DIAGNOSIS — K219 Gastro-esophageal reflux disease without esophagitis: Secondary | ICD-10-CM | POA: Diagnosis present

## 2020-08-02 DIAGNOSIS — R000 Tachycardia, unspecified: Secondary | ICD-10-CM | POA: Diagnosis not present

## 2020-08-02 DIAGNOSIS — I10 Essential (primary) hypertension: Secondary | ICD-10-CM | POA: Diagnosis not present

## 2020-08-02 DIAGNOSIS — I160 Hypertensive urgency: Secondary | ICD-10-CM | POA: Diagnosis present

## 2020-08-02 DIAGNOSIS — J449 Chronic obstructive pulmonary disease, unspecified: Secondary | ICD-10-CM | POA: Diagnosis present

## 2020-08-02 HISTORY — DX: Hypertensive emergency: I16.1

## 2020-08-02 LAB — BRAIN NATRIURETIC PEPTIDE: B Natriuretic Peptide: 1430.1 pg/mL — ABNORMAL HIGH (ref 0.0–100.0)

## 2020-08-02 LAB — CBC WITH DIFFERENTIAL/PLATELET
Abs Immature Granulocytes: 0.01 10*3/uL (ref 0.00–0.07)
Basophils Absolute: 0 10*3/uL (ref 0.0–0.1)
Basophils Relative: 1 %
Eosinophils Absolute: 0.1 10*3/uL (ref 0.0–0.5)
Eosinophils Relative: 2 %
HCT: 36.1 % (ref 36.0–46.0)
Hemoglobin: 11.1 g/dL — ABNORMAL LOW (ref 12.0–15.0)
Immature Granulocytes: 0 %
Lymphocytes Relative: 24 %
Lymphs Abs: 1.4 10*3/uL (ref 0.7–4.0)
MCH: 28.7 pg (ref 26.0–34.0)
MCHC: 30.7 g/dL (ref 30.0–36.0)
MCV: 93.3 fL (ref 80.0–100.0)
Monocytes Absolute: 0.3 10*3/uL (ref 0.1–1.0)
Monocytes Relative: 6 %
Neutro Abs: 3.9 10*3/uL (ref 1.7–7.7)
Neutrophils Relative %: 67 %
Platelets: 234 10*3/uL (ref 150–400)
RBC: 3.87 MIL/uL (ref 3.87–5.11)
RDW: 15.7 % — ABNORMAL HIGH (ref 11.5–15.5)
WBC: 5.8 10*3/uL (ref 4.0–10.5)
nRBC: 0 % (ref 0.0–0.2)

## 2020-08-02 LAB — RESP PANEL BY RT-PCR (FLU A&B, COVID) ARPGX2
Influenza A by PCR: NEGATIVE
Influenza B by PCR: NEGATIVE
SARS Coronavirus 2 by RT PCR: NEGATIVE

## 2020-08-02 LAB — MRSA PCR SCREENING: MRSA by PCR: NEGATIVE

## 2020-08-02 LAB — CBG MONITORING, ED
Glucose-Capillary: 333 mg/dL — ABNORMAL HIGH (ref 70–99)
Glucose-Capillary: 296 mg/dL — ABNORMAL HIGH (ref 70–99)
Glucose-Capillary: 199 mg/dL — ABNORMAL HIGH (ref 70–99)

## 2020-08-02 LAB — COMPREHENSIVE METABOLIC PANEL
ALT: 12 U/L (ref 0–44)
AST: 15 U/L (ref 15–41)
Albumin: 3 g/dL — ABNORMAL LOW (ref 3.5–5.0)
Alkaline Phosphatase: 75 U/L (ref 38–126)
Anion gap: 7 (ref 5–15)
BUN: 15 mg/dL (ref 8–23)
CO2: 24 mmol/L (ref 22–32)
Calcium: 8.8 mg/dL — ABNORMAL LOW (ref 8.9–10.3)
Chloride: 111 mmol/L (ref 98–111)
Creatinine, Ser: 1.62 mg/dL — ABNORMAL HIGH (ref 0.44–1.00)
GFR, Estimated: 35 mL/min — ABNORMAL LOW (ref >60)
Glucose, Bld: 401 mg/dL — ABNORMAL HIGH (ref 70–99)
Potassium: 5 mmol/L (ref 3.5–5.1)
Sodium: 142 mmol/L (ref 135–145)
Total Bilirubin: 0.5 mg/dL (ref 0.3–1.2)
Total Protein: 6.6 g/dL (ref 6.5–8.1)

## 2020-08-02 LAB — I-STAT ARTERIAL BLOOD GAS, ED
Acid-base deficit: 4 mmol/L — ABNORMAL HIGH (ref 0.0–2.0)
Bicarbonate: 20.8 mmol/L (ref 20.0–28.0)
Calcium, Ion: 1.21 mmol/L (ref 1.15–1.40)
HCT: 33 % — ABNORMAL LOW (ref 36.0–46.0)
Hemoglobin: 11.2 g/dL — ABNORMAL LOW (ref 12.0–15.0)
O2 Saturation: 99 %
Patient temperature: 97.5
Potassium: 4.2 mmol/L (ref 3.5–5.1)
Sodium: 142 mmol/L (ref 135–145)
TCO2: 22 mmol/L (ref 22–32)
pCO2 arterial: 34.1 mmHg (ref 32.0–48.0)
pH, Arterial: 7.391 (ref 7.350–7.450)
pO2, Arterial: 134 mmHg — ABNORMAL HIGH (ref 83.0–108.0)

## 2020-08-02 LAB — CBC
HCT: 35.5 % — ABNORMAL LOW (ref 36.0–46.0)
Hemoglobin: 11.3 g/dL — ABNORMAL LOW (ref 12.0–15.0)
MCH: 29.4 pg (ref 26.0–34.0)
MCHC: 31.8 g/dL (ref 30.0–36.0)
MCV: 92.4 fL (ref 80.0–100.0)
Platelets: 211 10*3/uL (ref 150–400)
RBC: 3.84 MIL/uL — ABNORMAL LOW (ref 3.87–5.11)
RDW: 15.7 % — ABNORMAL HIGH (ref 11.5–15.5)
WBC: 6.2 10*3/uL (ref 4.0–10.5)
nRBC: 0 % (ref 0.0–0.2)

## 2020-08-02 LAB — TROPONIN I (HIGH SENSITIVITY)
Troponin I (High Sensitivity): 18 ng/L — ABNORMAL HIGH (ref <18)
Troponin I (High Sensitivity): 54 ng/L — ABNORMAL HIGH (ref <18)
Troponin I (High Sensitivity): 104 ng/L (ref <18)
Troponin I (High Sensitivity): 231 ng/L (ref <18)
Troponin I (High Sensitivity): 228 ng/L (ref <18)

## 2020-08-02 LAB — GLUCOSE, CAPILLARY
Glucose-Capillary: 88 mg/dL (ref 70–99)
Glucose-Capillary: 295 mg/dL — ABNORMAL HIGH (ref 70–99)

## 2020-08-02 LAB — PROTIME-INR
INR: 0.9 (ref 0.8–1.2)
Prothrombin Time: 12.1 seconds (ref 11.4–15.2)

## 2020-08-02 LAB — CREATININE, SERUM
Creatinine, Ser: 1.57 mg/dL — ABNORMAL HIGH (ref 0.44–1.00)
GFR, Estimated: 37 mL/min — ABNORMAL LOW (ref >60)

## 2020-08-02 MED ORDER — ISOSORBIDE MONONITRATE ER 30 MG PO TB24
120.0000 mg | ORAL_TABLET | Freq: Every day | ORAL | Status: DC
  Administered 2020-08-02: 120 mg via ORAL
  Filled 2020-08-02: qty 4

## 2020-08-02 MED ORDER — PANTOPRAZOLE SODIUM 40 MG PO TBEC
40.0000 mg | DELAYED_RELEASE_TABLET | Freq: Every day | ORAL | Status: DC
  Administered 2020-08-02 – 2020-08-03 (×2): 40 mg via ORAL
  Filled 2020-08-02 (×2): qty 1

## 2020-08-02 MED ORDER — INSULIN GLARGINE 100 UNIT/ML ~~LOC~~ SOLN
20.0000 [IU] | Freq: Every day | SUBCUTANEOUS | Status: DC
  Administered 2020-08-02: 20 [IU] via SUBCUTANEOUS
  Filled 2020-08-02 (×3): qty 0.2

## 2020-08-02 MED ORDER — ACETAMINOPHEN 325 MG PO TABS
650.0000 mg | ORAL_TABLET | Freq: Four times a day (QID) | ORAL | Status: DC | PRN
  Administered 2020-08-02 – 2020-08-03 (×2): 650 mg via ORAL
  Filled 2020-08-02 (×2): qty 2

## 2020-08-02 MED ORDER — CARVEDILOL 12.5 MG PO TABS
12.5000 mg | ORAL_TABLET | Freq: Two times a day (BID) | ORAL | Status: DC
  Filled 2020-08-02: qty 1

## 2020-08-02 MED ORDER — ASPIRIN EC 81 MG PO TBEC
81.0000 mg | DELAYED_RELEASE_TABLET | Freq: Every day | ORAL | Status: DC
  Administered 2020-08-02 – 2020-08-03 (×2): 81 mg via ORAL
  Filled 2020-08-02 (×2): qty 1

## 2020-08-02 MED ORDER — SPIRONOLACTONE 12.5 MG HALF TABLET
12.5000 mg | ORAL_TABLET | Freq: Every day | ORAL | Status: DC
  Administered 2020-08-02: 12.5 mg via ORAL
  Filled 2020-08-02: qty 1

## 2020-08-02 MED ORDER — NITROGLYCERIN IN D5W 200-5 MCG/ML-% IV SOLN
0.0000 ug/min | INTRAVENOUS | Status: DC
  Administered 2020-08-02: 5 ug/min via INTRAVENOUS
  Filled 2020-08-02: qty 250

## 2020-08-02 MED ORDER — SACUBITRIL-VALSARTAN 49-51 MG PO TABS
1.0000 | ORAL_TABLET | Freq: Two times a day (BID) | ORAL | Status: DC
  Filled 2020-08-02: qty 1

## 2020-08-02 MED ORDER — ENOXAPARIN SODIUM 40 MG/0.4ML ~~LOC~~ SOLN
40.0000 mg | SUBCUTANEOUS | Status: DC
  Administered 2020-08-02 – 2020-08-03 (×2): 40 mg via SUBCUTANEOUS
  Filled 2020-08-02 (×2): qty 0.4

## 2020-08-02 MED ORDER — TICAGRELOR 90 MG PO TABS
90.0000 mg | ORAL_TABLET | Freq: Two times a day (BID) | ORAL | Status: DC
  Administered 2020-08-02 – 2020-08-03 (×3): 90 mg via ORAL
  Filled 2020-08-02 (×3): qty 1

## 2020-08-02 MED ORDER — ALBUTEROL SULFATE (2.5 MG/3ML) 0.083% IN NEBU: 2.5000 mg | INHALATION_SOLUTION | Freq: Four times a day (QID) | RESPIRATORY_TRACT | Status: DC | PRN

## 2020-08-02 MED ORDER — INSULIN ASPART 100 UNIT/ML ~~LOC~~ SOLN
0.0000 [IU] | Freq: Three times a day (TID) | SUBCUTANEOUS | Status: DC
  Administered 2020-08-02 – 2020-08-03 (×2): 11 [IU] via SUBCUTANEOUS

## 2020-08-02 MED ORDER — ACETAMINOPHEN 650 MG RE SUPP: 650.0000 mg | Freq: Four times a day (QID) | RECTAL | Status: DC | PRN

## 2020-08-02 MED ORDER — ATORVASTATIN CALCIUM 80 MG PO TABS
80.0000 mg | ORAL_TABLET | Freq: Every day | ORAL | Status: DC
  Administered 2020-08-02: 80 mg via ORAL
  Filled 2020-08-02: qty 1

## 2020-08-02 MED ORDER — FUROSEMIDE 10 MG/ML IJ SOLN
40.0000 mg | Freq: Once | INTRAMUSCULAR | Status: AC
  Administered 2020-08-02: 40 mg via INTRAVENOUS
  Filled 2020-08-02: qty 4

## 2020-08-02 NOTE — ED Notes (Signed)
Admitting team wants to transition pt to [place]. RT notified.

## 2020-08-02 NOTE — H&P (Signed)
Date: 08/02/2020               Patient Name:  [name] MRN: [id number]  DOB: [birth year] Age / Sex: 65 y.o., female   PCP: Marty Heck, DO         Medical Service: Internal Medicine Teaching Service         Attending Physician: Dr. Lucious Groves, DO    First Contact: Dr. Shon Baton Pager: [phone]  Second Contact: Dr. Darrick Meigs Pager: [phone]       After Hours (After 5p/  First Contact Pager: [phone]  weekends / holidays): Second Contact Pager: [phone]   Chief Complaint: shortness of breath  History of Present Illness:  NAVIL KOLE is a 65 year old female with ICM (EF 25-30%) NYHA class II-III, CAD s/p NSTEMI in February, hypertension, LBBB, uncontrolled diabetes (A1C >15.5), and CKD stage 3 who presented to [facility] ED via EMS on 08/02/20 after acute shortness of breath that woke her up from sleep. She took a nitrostat and shortness of breath improved however when shortness of breath returned shortly thereafter, her daughter called EMS. She notes having increasing dyspnea over the past 2-3 days but denies other respiratory symptoms. Denies chest pain. Denies recent sick contacts. She admitted to knowing to weight herself daily but has not been doing this. She does endorse adherence to her medications and was able to tell me the names of most of her medications. She does feel that she has had some swelling around her abdomen over the past few days which is where she notes that she typically builds up fluid.  Upon EMS arrival, she was noted to have a blood pressure of 230/118, HR 130, O2 saturation 74%. She was placed on CPAP and received 2 more doses of nitro tabs. On arrival to the ED, blood pressure had come down to 158/86. She was placed on BiPAP at 50% FiO2.  EKG showed LBBB with QTc of 537. Trop 18 ABG with pH 7.4, pCO2 34, bicarb 21. CXR remarkably clear. CBC, CMP unchanged from baseline. She was started on a nitro gtt and received lasix. IMTS was called for  admission.  As noted above, she was admitted in February for an NSTEMI. LHC showed severe diffuse 3 vessel disease. Cardiothoracic surgery was consulted however did not feel she was a good surgical candidate due to her recent COVID 19 infection and other comorbidites including poorly controlled T2DM. She underwent PCI to mid LAD and was started on DAPT.   Meds:  Current Meds  Medication Sig  . Accu-Chek FastClix Lancets MISC Check blood sugar up to  3x/day before meals  . ACCU-CHEK GUIDE test strip CHECK BLOOD SUGAR UP TO 3X/DAY BEFORE MEALS  . acetaminophen (TYLENOL) 500 MG tablet Take 1,000 mg by mouth every 6 (six) hours as needed for mild pain, fever or headache.  . albuterol (VENTOLIN HFA) 108 (90 Base) MCG/ACT inhaler INHALE 2 PUFFS INTO THE LUNGS EVERY 6 (SIX) HOURS AS NEEDED FOR WHEEZING OR SHORTNESS OF BREATH.  Marland Kitchen aspirin EC 81 MG tablet Take 1 tablet (81 mg total) by mouth daily. IM program, hope fund  . atorvastatin (LIPITOR) 80 MG tablet Take 80 mg by mouth at bedtime.  . blood glucose meter kit and supplies KIT Dispense based on patient and insurance preference. Use up to four times daily as directed. (FOR ICD-9 250.00, 250.01).  . blood glucose meter kit and supplies KIT Dispense based on patient and insurance  preference. Use up to four times daily as directed. (FOR ICD-9 250.00, 250.01).  . Blood Glucose Monitoring Suppl (ACCU-CHEK GUIDE) w/Device KIT 1 each by Does not apply route 3 (three) times daily.  Marland Kitchen BRILINTA 90 MG TABS tablet TAKE 1 TABLET (90 MG TOTAL) BY MOUTH TWO TIMES DAILY. (Patient taking differently: Take 90 mg by mouth 2 (two) times daily.)  . carvedilol (COREG) 12.5 MG tablet TAKE 1 TABLET (12.5 MG TOTAL) BY MOUTH 2 (TWO) TIMES DAILY WITH A MEAL.  . furosemide (LASIX) 20 MG tablet Take 20 mg by mouth as needed for fluid or edema.  . insulin glargine (LANTUS) 100 UNIT/ML Solostar Pen Inject 20 Units into the skin daily.  . Insulin Pen Needle (INSUPEN PEN NEEDLES)  32G X 4 MM MISC 1 Units by Does not apply route daily.  . isosorbide mononitrate (IMDUR) 120 MG 24 hr tablet Take 120 mg by mouth daily.  . metFORMIN (GLUCOPHAGE-XR) 500 MG 24 hr tablet Take 4 tablets (2,000 mg total) by mouth daily with breakfast. (Patient taking differently: Take 1,000 mg by mouth in the morning and at bedtime.)  . Multiple Vitamins-Minerals (MULTIVITAMIN GUMMIES ADULT) CHEW Chew 2 tablets by mouth daily.  . nitroGLYCERIN (NITROSTAT) 0.4 MG SL tablet Place 1 tablet (0.4 mg total) under the tongue every 5 (five) minutes as needed for chest pain.  Marland Kitchen omeprazole (PRILOSEC) 20 MG capsule TAKE 1 CAPSULE BY MOUTH DAILY. (Patient taking differently: Take 20 mg by mouth daily.)  . sacubitril-valsartan (ENTRESTO) 49-51 MG Take 1 tablet by mouth 2 (two) times daily.  Marland Kitchen spironolactone (ALDACTONE) 25 MG tablet Take 0.5 tablets (12.5 mg total) by mouth daily.  Marland Kitchen tiZANidine (ZANAFLEX) 4 MG tablet Take 1 tablet (4 mg total) by mouth every 6 (six) hours as needed for muscle spasms.  Marland Kitchen UNIFINE PENTIPS 31G X 5 MM MISC USE TO INJECT 20 UNITS DAILY  . [DISCONTINUED] isosorbide mononitrate (IMDUR) 30 MG 24 hr tablet Take 30 mg by mouth daily.     Allergies: Allergies as of 08/02/2020 - Review Complete 08/02/2020  Allergen Reaction Noted  . Lisinopril Cough 06/29/2017  . Plavix [clopidogrel bisulfate] Rash 12/21/2012   Past Medical History:  Diagnosis Date  . Anemia 07/17/2013  . Arthritis    "hands, shoulders, hips, legs" (05/10/2018)  . Arthritis of left hip 08/18/2013   S/p total hip arthroplasty.    . CHF (congestive heart failure) ([place])    2014...@ [facility]  . Chronic lower back pain   . CKD (chronic kidney disease) stage 3, GFR 30-59 ml/min (HCC)   . COPD (chronic obstructive pulmonary disease) ([place])   . Healthcare maintenance 06/16/2015  . High cholesterol   . Hypertension   . Influenza A 06/01/2017  . Influenza, pneumonia 06/01/2017  . Left flank pain 10/11/2016  . Myocardial infarction  [facility])    "been told that I've had one; don't know when it would have been" (05/10/2018)  . Problem with sexual relationship 10/13/2017  . PVD (peripheral vascular disease) ([place])   . Shortness of breath 03/12/2019  . Type II diabetes mellitus ([place])    diagnosed 2001    Family History:  Family History  Problem Relation Age of Onset  . Diabetes Mother     Social History:  Social History   Socioeconomic History  . Marital status: Married    Spouse name: Not on file  . Number of children: Not on file  . Years of education: 12,college  . Highest education level: Not on  file  Occupational History  . Not on file  Tobacco Use  . Smoking status: Former Smoker    Years: 7.00    Types: Cigarettes    Quit date: 05/08/1987    Years since quitting: 33.2  . Smokeless tobacco: Never Used  . Tobacco comment: 05/10/2018 "smoked when I drank"  Vaping Use  . Vaping Use: Never used  Substance and Sexual Activity  . Alcohol use: Not Currently    Alcohol/week: 0.0 standard drinks    Comment: 05/10/2018 "glass of wine on holidays"  . Drug use: Not Currently  . Sexual activity: Not Currently  Other Topics Concern  . Not on file  Social History Narrative  . Not on file   Social Determinants of Health   Financial Resource Strain: Not on file  Food Insecurity: Not on file  Transportation Needs: Not on file  Physical Activity: Not on file  Stress: Not on file  Social Connections: Not on file  Intimate Partner Violence: Not on file    Review of Systems: A complete ROS was negative except as per HPI.   Physical Exam: Blood pressure (!) 126/113, pulse 78, temperature 98.5 F (36.9 C), temperature source Axillary, resp. rate (!) 22, height _0  (1.6 m), weight 75.8 kg, SpO2 100 %.  General: acutely ill appearing HEENT: head atraumatic, no scleral icterus Cardiac: RRR, extremities warm, no lower extremity Pulm: on BiPAP. Mechanical breath sounds with mild rhonchi at the bilateral bases.   GI: soft, non-tender Neuro: a/o x4.  Msk: moves all extremities. Able to sit up in bed with minimal assist Skin: no rash GU: no suprapubic tenderness, purwik in place  EKG: LBBB  CXR: overall unremarkable. No infiltrate or edema.   Assessment & Plan by Problem: Active Problems:   Hypertensive emergency  65 year old female with significant comorbidities including 3-vessel CAD s/p NSTEMI (February 2022) s/p PCI to mid-LAD, Ischemic cardiomyopathy (EF 25-30%), hypertension, hyperlipidemia, uncontrolled diabetes, and CKD 3B who was admitted on 08/02/20 for hypertensive emergency management.  Acute hypoxic respiratory failure secondary to Hypertensive emergency 230/118 on arrival of EMS. Down to 158/86 on arrival to the ED. Placed on BiPAP, nitro gtt and lasix. Now off Bipap I suspect she experienced flash pulmonary edema however CXR looked suprisingly clear.  She endorses compliance with her medications. No significant EKG changes. Plan -wean supplemental oxygen as able.  -will work on slow decrease in blood pressures with a goal of around 150-160 today -d/c nitro gtt -trend trops  3 vessel CAD s/p NSTEMI s/p PCI to mid LAD in February. Underwent evaluation by cardiothoracic surgery who did not feel she was a surgical candidate due to her multiple comorbidity. Tropinemia. 18>54. Suspect this to be demand related. No ischemic EKG changes Ischemic Cardiomyopathy (NYHA class II-III). EF noted to have dropped following her most recent NSTEMI February from 55-60% to 25-30%. Follows with cardiology and recently started following with the heart failure clinic--saw Dr. Tempie Hoist on 3/10 at which time entresto was increased and started spironolactone.  She does not appear significantly volume up on exam today and her weight is actually down 4kg from her discharge weight in February Received 55m of IV lasix in the ED Plan -resume entresto and imdur tomorrow -continue spironolactone -will reassess  volume status in the AM to direct diuresis -SGLT2 deferred due to degree of A1C elevation -continue DAPT with brillinta and aspirin -will continue to trend troponins  Uncontrolled T2DM. Recent A1C >15.5. on lantus 20U and metformin 10027m  BID. -will hold metformin in the setting of diuresis -continue lantus 20U -will add SSI for inpatient care  CKD stage 3b. Renal function is at baseline. Monitor with diuresis.  Dispo: Admit patient to Inpatient with expected length of stay greater than 2 midnights.  Signed: Mitzi Hansen, MD Internal Medicine Resident PGY-2 [facility] Pager: [phone] 08/02/2020 11:46 AM    After 5pm on weekdays and 1pm on weekends: On Call pager: [phone]

## 2020-08-02 NOTE — ED Provider Notes (Signed)
MSE was initiated and I personally evaluated the patient and placed orders (if any) at  6:17 AM on August 02, 2020.   65 y.o.woman with poorly-controlled DM2, PAD, CKD 3b, LBBB, multivessel disease CAD s/p  PCI to mid LAD Presents via EMS with shortness of breath that woke her from sleep.  States is been progressively worsening over the past several days.  She took nitroglycerin at home with partial relief requiring calling EMS.  She was initially 74% on EMS arrival and placed on CPAP and started on nitroglycerin.  Patient feels improved since coming to the ED.  She is speaking short sentences.  She has diffuse crackles throughout. EKG shows left bundle branch block which is unchanged.  Patient was started nitroglycerin infusion and will be given IV Lasix.  Labs and imaging will be obtained including ABG.  The patient appears stable so that the remainder of the MSE may be completed by another provider.   Ezequiel Essex, MD 08/02/20 [phone]

## 2020-08-02 NOTE — ED Notes (Signed)
MD Darrick Meigs paged for trop 104. MD made aware. RN to continue to monitor.

## 2020-08-02 NOTE — Progress Notes (Signed)
Took patient off of the V60 BiPAP and placed on 4LNC, SATS 100%, patient tolerated well.

## 2020-08-02 NOTE — ED Triage Notes (Addendum)
Pt BIB EMS from home. Awoke feeling SOB, took nitro and fell back asleep, woke back up with SOB and called for help. 74% RA and diaphoretic on EMS arrival.   2 more of nitro given with EMS.  20G IV in L AC  EMS vitals: HR 130s 236/118 --> 174/110 with cpap 91% with cpap

## 2020-08-02 NOTE — Progress Notes (Signed)
New pt admission from ED. Pt brought to the floor in stable condition. Vitals taken. Initial Assessment done. All immediate pertinent needs to patient addressed. Patient Guide given to patient. Important safety instructions relating to hospitalization reviewed with patient. Patient verbalized understanding.  Pt has a BM on arrival to the unit. Getting up with one person assist to the [facility]. Oxygen saturation is 90's in RA, Diet order is placed.  Will continue to monitor the patient  Palma Holter, RN

## 2020-08-02 NOTE — ED Notes (Signed)
Repeat troponin is 54. Admit team paged at this time. Waiting for call back to report critical lab.

## 2020-08-02 NOTE — ED Notes (Signed)
Admit team called back. Critical troponin of 54 reported. Per admit team, maintain bp at [id number] systolic. Coreg and entresto held due to bp of 126/113 currently. Pt remains alert and oriented x 4. Will continue to monitor. Denies cp or sob. Stable at 4LPM [place] with 99% on monitor.

## 2020-08-02 NOTE — ED Provider Notes (Signed)
[facility] EMERGENCY DEPARTMENT Provider Note   CSN: [id number] Arrival date & time: 08/02/20  0549     History Chief Complaint  Patient presents with  . Shortness of Breath    [name] is a 65 y.o. female.  Patient home in respiratory distress.  She awoke short of breath and took nitroglycerin with partial relief.  EMS reports her initial O2 saturation was 74% on room air and she was diaphoretic.  EMS gave her 2 additional nitroglycerin and she started to feel better. Patient with a history of multivessel coronary disease status post PCI to her LAD, left bundle branch block, combined heart failure presents with progressively worsening shortness of breath over the past several days. She denies cough or fever.  She denies any chest pain but states it feels tight.  Denies any leg pain or leg swelling. States compliance with her medications  The history is provided by the EMS personnel and the patient. The history is limited by the condition of the patient.  Shortness of Breath Associated symptoms: no abdominal pain, no chest pain, no fever and no headaches        Past Medical History:  Diagnosis Date  . Anemia 07/17/2013  . Arthritis    "hands, shoulders, hips, legs" (05/10/2018)  . Arthritis of left hip 08/18/2013   S/p total hip arthroplasty.    . CHF (congestive heart failure) ([place])    2014...@ [facility]  . Chronic lower back pain   . CKD (chronic kidney disease) stage 3, GFR 30-59 ml/min (HCC)   . COPD (chronic obstructive pulmonary disease) ([place])   . Healthcare maintenance 06/16/2015  . High cholesterol   . Hypertension   . Influenza A 06/01/2017  . Influenza, pneumonia 06/01/2017  . Left flank pain 10/11/2016  . Myocardial infarction [facility])    "been told that I've had one; don't know when it would have been" (05/10/2018)  . Problem with sexual relationship 10/13/2017  . PVD (peripheral vascular disease) ([place])   . Shortness of breath 03/12/2019  . Type II  diabetes mellitus ([place])    diagnosed 2001    Patient Active Problem List   Diagnosis Date Noted  . Hyperglycemia   . NSTEMI (non-ST elevated myocardial infarction) ([place]) 06/06/2020  . COVID-19 04/13/2020  . Musculoskeletal neck pain 02/17/2020  . Abdominal fullness 09/26/2019  . Incomplete uterine prolapse 11/23/2018  . GERD (gastroesophageal reflux disease) 11/14/2018  . Mild depression ([place]) 04/11/2018  . Nonischemic cardiomyopathy ([place]) 08/31/2017  . Acute respiratory failure with hypoxia ([place]) 06/02/2017  . Pain in toes of both feet 01/26/2017  . Bilateral carotid artery disease ([place]) 11/10/2016  . Seasonal allergies 06/30/2016  . Health care maintenance 06/16/2015  . Nonproliferative diabetic retinopathy associated with type 2 diabetes mellitus ([place]) 06/15/2015  . Osteoarthritis of right hip 06/09/2015  . Type II diabetes mellitus with stage 3 chronic kidney disease ([place]) 07/17/2013  . Chronic combined systolic and diastolic CHF, NYHA class 2 ([place]) 07/07/2013  . CKD (chronic kidney disease) stage 3, GFR 30-59 ml/min (HCC) 11/22/2012  . HTN (hypertension) 11/08/2012    Past Surgical History:  Procedure Laterality Date  . ABDOMINAL ANGIOGRAM  12/28/2012  . ABDOMINAL ANGIOGRAM N/A 12/29/2011   Procedure: ABDOMINAL ANGIOGRAM;  Surgeon: Conrad Yeadon, MD;  Location[facility] CATH LAB;  Service: Cardiovascular;  Laterality: N/A;  . ABDOMINAL AORTAGRAM N/A 12/21/2012   Procedure: ABDOMINAL Maxcine Ham;  Surgeon: Elam Dutch, MD;  Location: [facility] CATH LAB;  Service: Cardiovascular;  Laterality: N/A;  . CORONARY STENT INTERVENTION N/A 06/11/2020   Procedure: CORONARY STENT INTERVENTION;  Surgeon: [name], MD;  Location: [facility] CV LAB;  Service: Cardiovascular;  Laterality: N/A;  . EYE SURGERY Bilateral ~ 2018   "laser; related to my diabetes"  . JOINT REPLACEMENT    . PERIPHERAL VASCULAR CATHETERIZATION     "had to have some veins unclogged cause I was in pain when I walked"  (05/10/2018)  . RIGHT/LEFT HEART CATH AND CORONARY ANGIOGRAPHY N/A 04/30/2018   Procedure: RIGHT/LEFT HEART CATH AND CORONARY ANGIOGRAPHY;  Surgeon: [name], MD;  Location: [place] CV LAB;  Service: Cardiovascular;  Laterality: N/A;  . RIGHT/LEFT HEART CATH AND CORONARY ANGIOGRAPHY N/A 06/09/2020   Procedure: RIGHT/LEFT HEART CATH AND CORONARY ANGIOGRAPHY;  Surgeon: [name], MD;  Location: [place] CV LAB;  Service: Cardiovascular;  Laterality: N/A;  . TOTAL HIP ARTHROPLASTY Left 08/19/2013   Procedure: TOTAL HIP ARTHROPLASTY;  Surgeon: Kerin Salen, MD;  Location: [facility];  Service: Orthopedics;  Laterality: Left;  . TUBAL LIGATION       OB History   No obstetric history on file.     Family History  Problem Relation Age of Onset  . Diabetes Mother     Social History   Tobacco Use  . Smoking status: Former Smoker    Years: 7.00    Types: Cigarettes    Quit date: 05/08/1987    Years since quitting: 33.2  . Smokeless tobacco: Never Used  . Tobacco comment: 05/10/2018 "smoked when I drank"  Vaping Use  . Vaping Use: Never used  Substance Use Topics  . Alcohol use: Not Currently    Alcohol/week: 0.0 standard drinks    Comment: 05/10/2018 "glass of wine on holidays"  . Drug use: Not Currently    Home Medications Prior to Admission medications   Medication Sig Start Date End Date Taking? Authorizing Provider  Accu-Chek FastClix Lancets MISC Check blood sugar up to  3x/day before meals 09/05/18   Seawell, Jaimie A, DO  ACCU-CHEK GUIDE test strip CHECK BLOOD SUGAR UP TO 3X/DAY BEFORE MEALS 11/08/19   Marianna Payment, MD  acetaminophen (TYLENOL) 500 MG tablet Take 1,000 mg by mouth every 6 (six) hours as needed for mild pain, fever or headache.    [provider]  albuterol (VENTOLIN HFA) 108 (90 Base) MCG/ACT inhaler INHALE 2 PUFFS INTO THE LUNGS EVERY 6 (SIX) HOURS AS NEEDED FOR WHEEZING OR SHORTNESS OF BREATH. 06/24/20   Cato Mulligan, MD  aspirin EC 81 MG tablet  Take 1 tablet (81 mg total) by mouth daily. IM program, [facility] 04/22/16   Norman Herrlich, MD  atorvastatin (LIPITOR) 80 MG tablet Take 80 mg by mouth at bedtime.    [provider]  blood glucose meter kit and supplies KIT Dispense based on patient and insurance preference. Use up to four times daily as directed. (FOR ICD-9 250.00, 250.01). 02/28/18   Kathi Ludwig, MD  blood glucose meter kit and supplies KIT Dispense based on patient and insurance preference. Use up to four times daily as directed. (FOR ICD-9 250.00, 250.01). 04/20/20   Sanjuan Dame, MD  Blood Glucose Monitoring Suppl (ACCU-CHEK GUIDE) w/Device KIT 1 each by Does not apply route 3 (three) times daily. 09/05/18   Seawell, Jaimie A, DO  BRILINTA 90 MG TABS tablet TAKE 1 TABLET (90 MG TOTAL) BY MOUTH TWO TIMES DAILY. 07/21/20   Seawell, Jaimie A, DO  carvedilol (COREG)  12.5 MG tablet TAKE 1 TABLET (12.5 MG TOTAL) BY MOUTH 2 (TWO) TIMES DAILY WITH A MEAL. 02/04/20   Lacinda Axon, MD  furosemide (LASIX) 20 MG tablet Take 20 mg by mouth as needed.    [provider]  insulin glargine (LANTUS) 100 UNIT/ML Solostar Pen Inject 20 Units into the skin daily. 07/16/20 08/15/20  Bloomfield, Carley D, DO  Insulin Pen Needle (INSUPEN PEN NEEDLES) 32G X 4 MM MISC 1 Units by Does not apply route daily. 06/12/20   Iona Beard, MD  isosorbide mononitrate (IMDUR) 30 MG 24 hr tablet Take 30 mg by mouth daily.    [provider]  metFORMIN (GLUCOPHAGE-XR) 500 MG 24 hr tablet Take 4 tablets (2,000 mg total) by mouth daily with breakfast. 10/24/19   Seawell, Jaimie A, DO  Multiple Vitamins-Minerals (MULTIVITAMIN GUMMIES ADULT) CHEW Chew 2 tablets by mouth daily.    [provider]  nitroGLYCERIN (NITROSTAT) 0.4 MG SL tablet Place 1 tablet (0.4 mg total) under the tongue every 5 (five) minutes as needed for chest pain. 05/15/20 08/13/20  [name], MD  omeprazole (PRILOSEC) 20 MG capsule TAKE 1 CAPSULE  BY MOUTH DAILY. 07/16/20   Bloomfield, Carley D, DO  sacubitril-valsartan (ENTRESTO) 49-51 MG Take 1 tablet by mouth 2 (two) times daily. 07/09/20   Bensimhon, Shaune Pascal, MD  spironolactone (ALDACTONE) 25 MG tablet Take 0.5 tablets (12.5 mg total) by mouth daily. 07/09/20   Bensimhon, Shaune Pascal, MD  tiZANidine (ZANAFLEX) 4 MG tablet Take 1 tablet (4 mg total) by mouth every 6 (six) hours as needed for muscle spasms. 02/17/20   Lacinda Axon, MD  UNIFINE PENTIPS 31G X 5 MM MISC USE TO INJECT 20 UNITS DAILY 04/09/19   Seawell, Jaimie A, DO    Allergies    Lisinopril and Plavix [clopidogrel bisulfate]  Review of Systems   Review of Systems  Constitutional: Negative for activity change, appetite change and fever.  HENT: Negative for congestion and rhinorrhea.   Respiratory: Positive for chest tightness and shortness of breath.   Cardiovascular: Negative for chest pain.  Gastrointestinal: Negative for abdominal pain and nausea.  Genitourinary: Negative for dysuria and hematuria.  Musculoskeletal: Negative for arthralgias and myalgias.  Neurological: Negative for dizziness, weakness and headaches.   all other systems are negative except as noted in the HPI and PMH.    Physical Exam Updated Vital Signs BP (!) 154/83   Pulse 89   Temp (!) 97.5 F (36.4 C) (Axillary)   Resp (!) 27   Ht _0  (1.6 m)   Wt 75.8 kg   SpO2 99%   BMI 29.58 kg/m   Physical Exam Vitals and nursing note reviewed.  Constitutional:      General: She is in acute distress.     Appearance: She is well-developed. She is ill-appearing.     Comments: Moderate respiratory distress, speaking short phrase  HENT:     Head: Normocephalic and atraumatic.     Mouth/Throat:     Pharynx: No oropharyngeal exudate.  Eyes:     Conjunctiva/sclera: Conjunctivae normal.     Pupils: Pupils are equal, round, and reactive to light.  Neck:     Comments: No meningismus. Cardiovascular:     Rate and Rhythm: Normal rate and  regular rhythm.     Heart sounds: Normal heart sounds. No murmur heard.   Pulmonary:     Effort: Respiratory distress present.     Breath sounds: Rhonchi and rales present.  Comments: Scattered rales and rhonchi Abdominal:     Palpations: Abdomen is soft.     Tenderness: There is no abdominal tenderness. There is no guarding or rebound.  Musculoskeletal:        General: No tenderness. Normal range of motion.     Cervical back: Normal range of motion and neck supple.  Skin:    General: Skin is warm.  Neurological:     Mental Status: She is alert and oriented to person, place, and time.     Cranial Nerves: No cranial nerve deficit.     Motor: No abnormal muscle tone.     Coordination: Coordination normal.     Comments:  5/5 strength throughout. CN 2-12 intact.Equal grip strength.   Psychiatric:        Behavior: Behavior normal.     ED Results / Procedures / Treatments   Labs (all labs ordered are listed, but only abnormal results are displayed) Labs Reviewed  CBC WITH DIFFERENTIAL/PLATELET - Abnormal; Notable for the following components:      Result Value   Hemoglobin 11.1 (*)    RDW 15.7 (*)    All other components within normal limits  I-STAT ARTERIAL BLOOD GAS, ED - Abnormal; Notable for the following components:   pO2, Arterial 134 (*)    Acid-base deficit 4.0 (*)    HCT 33.0 (*)    Hemoglobin 11.2 (*)    All other components within normal limits  CBG MONITORING, ED - Abnormal; Notable for the following components:   Glucose-Capillary 333 (*)    All other components within normal limits  RESP PANEL BY RT-PCR (FLU A&B, COVID) ARPGX2  PROTIME-INR  COMPREHENSIVE METABOLIC PANEL  BRAIN NATRIURETIC PEPTIDE  TROPONIN I (HIGH SENSITIVITY)    EKG EKG Interpretation  Date/Time:  Sunday August 02 2020 05:56:41 EDT Ventricular Rate:  101 PR Interval:  151 QRS Duration: 153 QT Interval:  414 QTC Calculation: 537 R Axis:   -51 Text Interpretation: Sinus  tachycardia Ventricular premature complex Left bundle branch block No significant change was found Confirmed by Ezequiel Essex [phone]) on 08/02/2020 6:04:39 AM   Radiology DG Chest Portable 1 View  Result Date: 08/02/2020 CLINICAL DATA:  65 year old female with shortness of breath, poor oxygenation on room air. Diaphoresis. EXAM: PORTABLE CHEST 1 VIEW COMPARISON:  PA chest radiograph 06/06/2020 and earlier. FINDINGS: Portable AP upright view at 0612 hours. Stable lung volumes. Mediastinal contours are stable and within normal limits. Curvilinear scarring or atelectasis in the left mid lung is chronic and stable. No superimposed pneumothorax. Pulmonary vascularity appears stable and within normal limits. No pleural effusion or acute pulmonary opacity identified. Visualized tracheal air column is within normal limits. No acute osseous abnormality identified. IMPRESSION: No acute cardiopulmonary abnormality on portable chest. Chronic left mid lung scarring/atelectasis. Electronically Signed   By: Genevie Ann M.D.   On: 08/02/2020 06:40    Procedures .Critical Care Performed by: Ezequiel Essex, MD Authorized by: Ezequiel Essex, MD   Critical care provider statement:    Critical care time (minutes):  45   Critical care was necessary to treat or prevent imminent or life-threatening deterioration of the following conditions:  Cardiac failure and respiratory failure   Critical care was time spent personally by me on the following activities:  Discussions with consultants, evaluation of patient's response to treatment, examination of patient, ordering and performing treatments and interventions, ordering and review of laboratory studies, ordering and review of radiographic studies, pulse oximetry, re-evaluation of patient's condition,  obtaining history from patient or surrogate and review of old charts     Medications Ordered in ED Medications  nitroGLYCERIN 50 mg in dextrose 5 % 250 mL (0.2 mg/mL)  infusion (15 mcg/min Intravenous Rate/Dose Change 08/02/20 0652)  furosemide (LASIX) injection 40 mg (40 mg Intravenous Given 08/02/20 7125)    ED Course  I have reviewed the triage vital signs and the nursing notes.  Pertinent labs & imaging results that were available during my care of the patient were reviewed by me and considered in my medical decision making (see chart for details).    MDM Rules/Calculators/A&P                          Sudden onset respiratory distress with hypoxia and elevated blood pressure.  Placed on BiPAP.  Concern for hypertensive emergency and CHF exacerbation.  EKG is unchanged left bundle branch block.  Denies chest pain.  Patient given IV Lasix and placed on nitroglycerin drip  CXR surprisingly clear. BP improving after lasix and NTG gtt.  COVID negative.  Suspect flash pulmonary edema due to hypertensive emergency but given clear chest xray, will check D-dimer.  No CO2 retention on ABG.  D/w internal medicine residents.  Final Clinical Impression(s) / ED Diagnoses Final diagnoses:  Acute respiratory failure with hypoxia [facility])  Hypertensive emergency    Rx / DC Orders ED Discharge Orders    None       Darcelle Herrada, Annie Main, MD 08/02/20 [phone]

## 2020-08-03 ENCOUNTER — Encounter (HOSPITAL_COMMUNITY): Payer: Self-pay | Admitting: Internal Medicine

## 2020-08-03 DIAGNOSIS — I160 Hypertensive urgency: Secondary | ICD-10-CM | POA: Diagnosis present

## 2020-08-03 DIAGNOSIS — E7800 Pure hypercholesterolemia, unspecified: Secondary | ICD-10-CM | POA: Diagnosis present

## 2020-08-03 DIAGNOSIS — I447 Left bundle-branch block, unspecified: Secondary | ICD-10-CM | POA: Diagnosis present

## 2020-08-03 DIAGNOSIS — Z96642 Presence of left artificial hip joint: Secondary | ICD-10-CM | POA: Diagnosis present

## 2020-08-03 DIAGNOSIS — E1122 Type 2 diabetes mellitus with diabetic chronic kidney disease: Secondary | ICD-10-CM | POA: Diagnosis present

## 2020-08-03 DIAGNOSIS — I5042 Chronic combined systolic (congestive) and diastolic (congestive) heart failure: Secondary | ICD-10-CM | POA: Diagnosis present

## 2020-08-03 DIAGNOSIS — J449 Chronic obstructive pulmonary disease, unspecified: Secondary | ICD-10-CM | POA: Diagnosis present

## 2020-08-03 DIAGNOSIS — Z87891 Personal history of nicotine dependence: Secondary | ICD-10-CM | POA: Diagnosis not present

## 2020-08-03 DIAGNOSIS — E1151 Type 2 diabetes mellitus with diabetic peripheral angiopathy without gangrene: Secondary | ICD-10-CM | POA: Diagnosis present

## 2020-08-03 DIAGNOSIS — I2510 Atherosclerotic heart disease of native coronary artery without angina pectoris: Secondary | ICD-10-CM | POA: Diagnosis present

## 2020-08-03 DIAGNOSIS — Z7902 Long term (current) use of antithrombotics/antiplatelets: Secondary | ICD-10-CM | POA: Diagnosis not present

## 2020-08-03 DIAGNOSIS — I130 Hypertensive heart and chronic kidney disease with heart failure and stage 1 through stage 4 chronic kidney disease, or unspecified chronic kidney disease: Secondary | ICD-10-CM | POA: Diagnosis present

## 2020-08-03 DIAGNOSIS — Z79899 Other long term (current) drug therapy: Secondary | ICD-10-CM | POA: Diagnosis not present

## 2020-08-03 DIAGNOSIS — Z955 Presence of coronary angioplasty implant and graft: Secondary | ICD-10-CM | POA: Diagnosis not present

## 2020-08-03 DIAGNOSIS — I252 Old myocardial infarction: Secondary | ICD-10-CM | POA: Diagnosis not present

## 2020-08-03 DIAGNOSIS — N1832 Chronic kidney disease, stage 3b: Secondary | ICD-10-CM | POA: Diagnosis present

## 2020-08-03 DIAGNOSIS — J9601 Acute respiratory failure with hypoxia: Secondary | ICD-10-CM | POA: Diagnosis not present

## 2020-08-03 DIAGNOSIS — I161 Hypertensive emergency: Secondary | ICD-10-CM | POA: Diagnosis present

## 2020-08-03 DIAGNOSIS — E785 Hyperlipidemia, unspecified: Secondary | ICD-10-CM | POA: Diagnosis present

## 2020-08-03 DIAGNOSIS — Z7982 Long term (current) use of aspirin: Secondary | ICD-10-CM | POA: Diagnosis not present

## 2020-08-03 DIAGNOSIS — Z7984 Long term (current) use of oral hypoglycemic drugs: Secondary | ICD-10-CM | POA: Diagnosis not present

## 2020-08-03 DIAGNOSIS — Z833 Family history of diabetes mellitus: Secondary | ICD-10-CM | POA: Diagnosis not present

## 2020-08-03 DIAGNOSIS — Z794 Long term (current) use of insulin: Secondary | ICD-10-CM | POA: Diagnosis not present

## 2020-08-03 DIAGNOSIS — Z8616 Personal history of COVID-19: Secondary | ICD-10-CM | POA: Diagnosis not present

## 2020-08-03 DIAGNOSIS — I255 Ischemic cardiomyopathy: Secondary | ICD-10-CM | POA: Diagnosis present

## 2020-08-03 HISTORY — DX: Hypertensive urgency: I16.0

## 2020-08-03 LAB — BASIC METABOLIC PANEL
Anion gap: 10 (ref 5–15)
BUN: 19 mg/dL (ref 8–23)
CO2: 22 mmol/L (ref 22–32)
Calcium: 8.7 mg/dL — ABNORMAL LOW (ref 8.9–10.3)
Chloride: 108 mmol/L (ref 98–111)
Creatinine, Ser: 1.51 mg/dL — ABNORMAL HIGH (ref 0.44–1.00)
GFR, Estimated: 39 mL/min — ABNORMAL LOW (ref >60)
Glucose, Bld: 160 mg/dL — ABNORMAL HIGH (ref 70–99)
Potassium: 3.6 mmol/L (ref 3.5–5.1)
Sodium: 140 mmol/L (ref 135–145)

## 2020-08-03 LAB — CBC
HCT: 33 % — ABNORMAL LOW (ref 36.0–46.0)
Hemoglobin: 10.7 g/dL — ABNORMAL LOW (ref 12.0–15.0)
MCH: 29.2 pg (ref 26.0–34.0)
MCHC: 32.4 g/dL (ref 30.0–36.0)
MCV: 90.2 fL (ref 80.0–100.0)
Platelets: 220 10*3/uL (ref 150–400)
RBC: 3.66 MIL/uL — ABNORMAL LOW (ref 3.87–5.11)
RDW: 15.8 % — ABNORMAL HIGH (ref 11.5–15.5)
WBC: 6.1 10*3/uL (ref 4.0–10.5)
nRBC: 0 % (ref 0.0–0.2)

## 2020-08-03 LAB — GLUCOSE, CAPILLARY
Glucose-Capillary: 266 mg/dL — ABNORMAL HIGH (ref 70–99)
Glucose-Capillary: 112 mg/dL — ABNORMAL HIGH (ref 70–99)

## 2020-08-03 MED ORDER — SACUBITRIL-VALSARTAN 49-51 MG PO TABS
1.0000 | ORAL_TABLET | Freq: Two times a day (BID) | ORAL | Status: DC
  Administered 2020-08-03: 1 via ORAL
  Filled 2020-08-03 (×2): qty 1

## 2020-08-03 NOTE — Discharge Summary (Signed)
Name: [name] MRN: [id number] DOB: 04/26/1956 65 y.o. PCP: Marty Heck, DO  Date of Admission: 08/02/2020  5:49 AM Date of Discharge: 08/03/20 Attending Physician: Lucious Groves, DO  Discharge Diagnosis: 1. Hypertensive emergency 2. Acute hypoxic respiratory failure 3. Type II NSTEMI 4. Uncontrolled Type 2 Diabetes Mellitis 5. Osteoarthritis   Discharge Medications: Allergies as of 08/03/2020      Reactions   Lisinopril Cough   Plavix [clopidogrel Bisulfate] Rash      Medication List    STOP taking these medications   losartan 100 MG tablet Commonly known as: COZAAR     TAKE these medications   Accu-Chek FastClix Lancets Misc Check blood sugar up to  3x/day before meals   Accu-Chek Guide w/Device Kit 1 each by Does not apply route 3 (three) times daily.   acetaminophen 500 MG tablet Commonly known as: TYLENOL Take 1,000 mg by mouth every 6 (six) hours as needed for mild pain, fever or headache.   albuterol 108 (90 Base) MCG/ACT inhaler Commonly known as: VENTOLIN HFA INHALE 2 PUFFS INTO THE LUNGS EVERY 6 (SIX) HOURS AS NEEDED FOR WHEEZING OR SHORTNESS OF BREATH.   aspirin EC 81 MG tablet Take 1 tablet (81 mg total) by mouth daily. IM program, [facility]   atorvastatin 80 MG tablet Commonly known as: LIPITOR Take 80 mg by mouth at bedtime. What changed: Another medication with the same name was removed. Continue taking this medication, and follow the directions you see here.   blood glucose meter kit and supplies Kit Dispense based on patient and insurance preference. Use up to four times daily as directed. (FOR ICD-9 250.00, 250.01). What changed: Another medication with the same name was removed. Continue taking this medication, and follow the directions you see here.   Brilinta 90 MG Tabs tablet Generic drug: ticagrelor TAKE 1 TABLET (90 MG TOTAL) BY MOUTH TWO TIMES DAILY. What changed: how much to take   carvedilol 12.5 MG tablet Commonly  known as: COREG TAKE 1 TABLET (12.5 MG TOTAL) BY MOUTH 2 (TWO) TIMES DAILY WITH A MEAL.   Entresto 49-51 MG Generic drug: sacubitril-valsartan Take 1 tablet by mouth 2 (two) times daily.   furosemide 20 MG tablet Commonly known as: LASIX Take 20 mg by mouth as needed for fluid or edema.   insulin glargine 100 UNIT/ML Solostar Pen Commonly known as: LANTUS Inject 20 Units into the skin daily.   isosorbide mononitrate 120 MG 24 hr tablet Commonly known as: IMDUR TAKE 1 TABLET (120 MG TOTAL) BY MOUTH DAILY. What changed: Another medication with the same name was removed. Continue taking this medication, and follow the directions you see here.   Jardiance 25 MG Tabs tablet Generic drug: empagliflozin TAKE 1 TABLET (25 MG TOTAL) BY MOUTH DAILY.   metFORMIN 500 MG 24 hr tablet Commonly known as: GLUCOPHAGE-XR Take 4 tablets (2,000 mg total) by mouth daily with breakfast. What changed:   how much to take  when to take this   Multivitamin Gummies Adult Chew Chew 2 tablets by mouth daily.   nitroGLYCERIN 0.4 MG SL tablet Commonly known as: NITROSTAT Place 1 tablet (0.4 mg total) under the tongue every 5 (five) minutes as needed for chest pain.   omeprazole 20 MG capsule Commonly known as: PRILOSEC TAKE 1 CAPSULE BY MOUTH DAILY.   spironolactone 25 MG tablet Commonly known as: ALDACTONE Take 0.5 tablets (12.5 mg total) by mouth daily.   tiZANidine 4 MG tablet Commonly  known as: Zanaflex Take 1 tablet (4 mg total) by mouth every 6 (six) hours as needed for muscle spasms.   Trulicity 8.50 YD/7.4JO Sopn Generic drug: Dulaglutide INJECT 0.75 MG INTO THE SKIN ONCE A WEEK.   Unifine Pentips 31G X 5 MM Misc Generic drug: Insulin Pen Needle USE TO INJECT 20 UNITS DAILY   Insupen Pen Needles 32G X 4 MM Misc Generic drug: Insulin Pen Needle 1 Units by Does not apply route daily.       Disposition and follow-up:   [name] was discharged from [facility] in Stable condition.  At the hospital follow up visit please address:  Hypertensive emergency>flash pulmonary edema>acute hypoxic respiratory failure, Type II NSTEMI (demand) (resolved) Ischemic Cardiomyopathy (EF 25-30%) NYHA class II-III. Discharge wt 77.4kg 3 Vessel Coronary Artery Disease s/p NSTEMI (February 2022) s/p PCI to mid-LAD. Evaluated by cardiothoracic surgery at that time however they did not feel she was an good surgical candidate.  No medication changes at discharge Follow up recommendations -continue to encourage daily weights and diet adherence -continue to follow with the heart failure clinic and PCP   Uncontrolled Type II Diabetes Mellitus. A1C slowly improving since starting Lantus--A1C 15.5>11.9.  Medication changes: resume Jardiance $RemoveBeforeDE'25mg'qsQzagziBcHIdww$  daily Follow up recommendations -continue to follow with PCP -next A1C due June 2022  Osteoarthritis. Pt requested a prescription for Tramadol at time of discharge. On brief chart review of prior films, it appears that she had fairly severe arthritis of her right hip back in 2018. She also notes bilateral knee pain.  She has been referred to orthopedics however I am doubtful that she would be a candidate for an elective procedure with her current A1C. I explained that this is something that she will need to follow up with her PCP to discuss pain management further. Follow up recommendations -consider a referral to sports medicine at time of follow up -encourage quad strengthening  Labs / imaging needed at time of follow-up: none  Pending labs/ test needing follow-up: none  Follow-up Appointments:  Follow-up Information    [name], MD .   Specialty: Cardiology Contact information: [phone] [place] [address] [phone]        [facility]. Schedule an appointment as soon as possible for a visit in 3 day(s).   Why: Please arrange an office visit  in 3-5 days after discharge. Contact information: [address] [place] Hospital Course: [name] is a 65 year old female with ICM (EF 25-30%) NYHA class II-III, CAD s/p NSTEMI in February, hypertension, LBBB, uncontrolled diabetes (A1C >15.5), and CKD stage 3 who presented to [facility] ED via EMS on 08/02/20 after acute shortness of breath that woke her up from sleep. She took a nitrostat and shortness of breath improved however when shortness of breath returned shortly thereafter, her daughter called EMS.  Upon EMS arrival, she was noted to have a blood pressure of 230/118, HR 130, O2 saturation 74%. She was placed on CPAP and received 2 more doses of nitro tabs. On arrival to the ED, blood pressure had come down to 158/86. She was placed on BiPAP at 50% FiO2.  EKG showed LBBB with QTc of 537. Trop 18 ABG with pH 7.4, pCO2 34, bicarb 21. CXR remarkably clear. CBC, CMP unchanged from baseline. She was started on  a nitro gtt and received lasix. IMTS was called for admission.  She notes having increasing dyspnea over the past 2-3 days but denies other respiratory symptoms. On further questioning, she had also endorsed some recent dietary indiscretions.  Nitro drip was discontinued on day of admission and home medications were resumed. Blood pressures improved and she became normotensive throughout the day of admission.  BNP was severely elevated at 1430 which was increased from BNP on 3/10 which was 696.  Serial hsTroponins peaked at 231 followed by 228.  There were no significant changes in her EKG findings when compared to prior. She was weaned off BiPAP to room air on day of admission.   She was medically stable for discharge on hospital day #1 with instructions to follow up with her PCP and heart failure.  Impression: Overall, I suspect that her acute respiratory failure was a result of flash pulmonary edema that was secondary  to hypertensive emergency. The hypertensive emergency may have been the result of those dietary indiscretions. She endorsed compliance with her home medications which has been reflected in her vitals that I reviewed from prior office visits which have shown normotension.   Day of discharge subjective: Feeling well this morning. Slept well overnight. No complaints.  Discharge Vitals:   BP (!) 166/69 (BP Location: Left Arm)   Pulse 96   Temp 98.4 F (36.9 C) (Oral)   Resp (!) 26   Ht $R'5\' 3"'Wr$  (1.6 m)   Wt 77.4 kg   SpO2 97%   BMI 30.23 kg/m   General: well-appearing appearing woman sitting up in bed, in no acute distress Cardiac: RRR, extremities warm, no lower extremity edema Pulm: Lungs with mild rales at the bilateral bases, otherwise clear Abdominal: soft, non-tender, non-distended Neuro: a/o x4.  Skin: warm and dry  Pertinent Labs, Studies, and Procedures:   CXR: no acute abnormalities  CBC Latest Ref Rng & Units 08/03/2020 08/02/2020 08/02/2020  WBC 4.0 - 10.5 K/uL 6.1 6.2 -  Hemoglobin 12.0 - 15.0 g/dL 10.7(L) 11.3(L) 11.2(L)  Hematocrit 36.0 - 46.0 % 33.0(L) 35.5(L) 33.0(L)  Platelets 150 - 400 K/uL 220 211 -   BMP Latest Ref Rng & Units 08/03/2020 08/02/2020 08/02/2020  Glucose 70 - 99 mg/dL 160(H) - -  BUN 8 - 23 mg/dL 19 - -  Creatinine 0.44 - 1.00 mg/dL 1.51(H) 1.57(H) -  BUN/Creat Ratio 12 - 28 - - -  Sodium 135 - 145 mmol/L 140 - 142  Potassium 3.5 - 5.1 mmol/L 3.6 - 4.2  Chloride 98 - 111 mmol/L 108 - -  CO2 22 - 32 mmol/L 22 - -  Calcium 8.9 - 10.3 mg/dL 8.7(L) - -     Discharge Instructions: Discharge Instructions    (HEART FAILURE PATIENTS) Call MD:  Anytime you have any of the following symptoms: 1) 3 pound weight gain in 24 hours or 5 pounds in 1 week 2) shortness of breath, with or without a dry hacking cough 3) swelling in the hands, feet or stomach 4) if you have to sleep on extra pillows at night in order to breathe.   Complete by: As directed    Call MD for:   difficulty breathing, headache or visual disturbances   Complete by: As directed    Call MD for:  persistant dizziness or light-headedness   Complete by: As directed    Diet - low sodium heart healthy   Complete by: As directed       Signed:  Calel Pisarski  Darrick Meigs, MD Internal Medicine Resident PGY-2 [facility] Pager: [phone] 08/03/2020 2:32 PM

## 2020-08-03 NOTE — Plan of Care (Signed)
  Problem: Education: Goal: Knowledge of General Education information will improve Description: Including pain rating scale, medication(s)/side effects and non-pharmacologic comfort measures Outcome: Progressing   Problem: Clinical Measurements: Goal: Respiratory complications will improve Outcome: Progressing Goal: Cardiovascular complication will be avoided Outcome: Progressing   Problem: Activity: Goal: Risk for activity intolerance will decrease Outcome: Progressing   Problem: Pain Managment: Goal: General experience of comfort will improve Outcome: Progressing

## 2020-08-03 NOTE — Plan of Care (Signed)
  Problem: Education: Goal: Knowledge of General Education information will improve Description: Including pain rating scale, medication(s)/side effects and non-pharmacologic comfort measures 08/03/2020 1518 by Thressa Sheller, RN Outcome: Adequate for Discharge 08/03/2020 1301 by Thressa Sheller, RN Outcome: Progressing   Problem: Health Behavior/Discharge Planning: Goal: Ability to manage health-related needs will improve Outcome: Adequate for Discharge   Problem: Clinical Measurements: Goal: Ability to maintain clinical measurements within normal limits will improve Outcome: Adequate for Discharge Goal: Will remain free from infection Outcome: Adequate for Discharge Goal: Diagnostic test results will improve Outcome: Adequate for Discharge Goal: Respiratory complications will improve 08/03/2020 1518 by Thressa Sheller, RN Outcome: Adequate for Discharge 08/03/2020 1301 by Thressa Sheller, RN Outcome: Progressing Goal: Cardiovascular complication will be avoided 08/03/2020 1518 by Thressa Sheller, RN Outcome: Adequate for Discharge 08/03/2020 1301 by Thressa Sheller, RN Outcome: Progressing   Problem: Activity: Goal: Risk for activity intolerance will decrease 08/03/2020 1518 by Thressa Sheller, RN Outcome: Adequate for Discharge 08/03/2020 1301 by Thressa Sheller, RN Outcome: Progressing   Problem: Nutrition: Goal: Adequate nutrition will be maintained Outcome: Adequate for Discharge   Problem: Coping: Goal: Level of anxiety will decrease Outcome: Adequate for Discharge   Problem: Elimination: Goal: Will not experience complications related to bowel motility Outcome: Adequate for Discharge Goal: Will not experience complications related to urinary retention Outcome: Adequate for Discharge   Problem: Pain Managment: Goal: General experience of comfort will improve 08/03/2020 1518 by Thressa Sheller, RN Outcome: Adequate for Discharge 08/03/2020 1301 by  Thressa Sheller, RN Outcome: Progressing   Problem: Safety: Goal: Ability to remain free from injury will improve Outcome: Adequate for Discharge   Problem: Skin Integrity: Goal: Risk for impaired skin integrity will decrease Outcome: Adequate for Discharge   Problem: Education: Goal: Ability to demonstrate management of disease process will improve Outcome: Adequate for Discharge Goal: Ability to verbalize understanding of medication therapies will improve Outcome: Adequate for Discharge Goal: Individualized Educational Video(s) Outcome: Adequate for Discharge   Problem: Activity: Goal: Capacity to carry out activities will improve Outcome: Adequate for Discharge   Problem: Cardiac: Goal: Ability to achieve and maintain adequate cardiopulmonary perfusion will improve Outcome: Adequate for Discharge

## 2020-08-03 NOTE — TOC Initial Note (Signed)
Transition of Care [facility]) - Initial/Assessment Note    Patient Details  Name: [name] MRN: [id number] Date of Birth: [birth year]  Transition of Care [facility]) CM/SW Contact:    Zenon Mayo, RN Phone Number: 08/03/2020, 3:09 PM  Clinical Narrative:                 Patient states she lives with her spouse and she states she would like to have outpatient physical therapy and she has transport to get there.  She also asked this  NCM if she could have somone come in to clean her house for her.  She will probably need to hire [facility] service for that.  She states she is for dc tomorrow.   Expected Discharge Plan: Home/Self Care Barriers to Discharge: Continued Medical Work up   Patient Goals and CMS Choice Patient states their goals for this hospitalization and ongoing recovery are:: return home   Choice offered to / list presented to : NA  Expected Discharge Plan and Services Expected Discharge Plan: Home/Self Care       Living arrangements for the past 2 months: Apartment Expected Discharge Date: 08/03/20                 DME Agency: NA       HH Arranged: NA          Prior Living Arrangements/Services Living arrangements for the past 2 months: Apartment Lives with:: Spouse Patient language and need for interpreter reviewed:: Yes Do you feel safe going back to the place where you live?: Yes      Need for Family Participation in Patient Care: Yes (Comment) Care giver support system in place?: Yes (comment) Current home services:  (3 n 1) Criminal Activity/Legal Involvement Pertinent to Current Situation/Hospitalization: No - Comment as needed  Activities of Daily Living Home Assistive Devices/Equipment: Cane (specify quad or straight) ADL Screening (condition at time of admission) Patient's cognitive ability adequate to safely complete daily activities?: Yes Is the patient deaf or have difficulty hearing?: No Does the patient have difficulty seeing, even  when wearing glasses/contacts?: No Does the patient have difficulty concentrating, remembering, or making decisions?: No Patient able to express need for assistance with ADLs?: Yes Does the patient have difficulty dressing or bathing?: No Independently performs ADLs?: Yes (appropriate for developmental age) Does the patient have difficulty walking or climbing stairs?: Yes Weakness of Legs: Both Weakness of Arms/Hands: None  Permission Sought/Granted                  Emotional Assessment   Attitude/Demeanor/Rapport: Engaged Affect (typically observed): Appropriate Orientation: : Oriented to Self,Oriented to Place,Oriented to  Time,Oriented to Situation Alcohol / Substance Use: Not Applicable Psych Involvement: No (comment)  Admission diagnosis:  Acute respiratory failure with hypoxia ([place]) [J96.01] Hypertensive emergency [I16.1] Hypertensive urgency [I16.0] Patient Active Problem List   Diagnosis Date Noted  . Hypertensive urgency 08/03/2020  . Hypertensive emergency 08/02/2020  . Hyperglycemia   . NSTEMI (non-ST elevated myocardial infarction) ([place]) 06/06/2020  . COVID-19 04/13/2020  . Musculoskeletal neck pain 02/17/2020  . Abdominal fullness 09/26/2019  . Incomplete uterine prolapse 11/23/2018  . GERD (gastroesophageal reflux disease) 11/14/2018  . Mild depression ([place]) 04/11/2018  . Nonischemic cardiomyopathy ([place]) 08/31/2017  . Acute respiratory failure with hypoxia ([place]) 06/02/2017  . Pain in toes of both feet 01/26/2017  . Bilateral carotid artery disease ([place]) 11/10/2016  . Seasonal allergies 06/30/2016  . Health care maintenance 06/16/2015  . Nonproliferative  diabetic retinopathy associated with type 2 diabetes mellitus ([place]) 06/15/2015  . Osteoarthritis of right hip 06/09/2015  . Type II diabetes mellitus with stage 3 chronic kidney disease ([place]) 07/17/2013  . Chronic combined systolic and diastolic CHF, NYHA class 2 ([place]) 07/07/2013  . CKD (chronic kidney  disease) stage 3, GFR 30-59 ml/min ([facility]) 11/22/2012  . HTN (hypertension) 11/08/2012   PCP:  Marty Heck, DO Pharmacy:   [facility] [address] [place] [address] Phone: [phone] Fax: [place], [place] [address] Phone: [phone] Fax: [phone]  [facility] [address] [place] [address] Phone: [phone] Fax: [phone]     Social Determinants of Health (SDOH) Interventions    Readmission Risk Interventions Readmission Risk Prevention Plan 08/03/2020 06/12/2020  Transportation Screening Complete Complete  PCP or Specialist Appt within 5-7 Days - Complete  PCP or Specialist Appt within 3-5 Days Complete -  Home Care Screening - Complete  Medication Review (RN CM) - Complete  HRI or Home Care Consult Complete -  Social Work Consult for [place] Planning/Counseling Complete -  Palliative Care Screening Not Applicable -  Medication Review Press photographer) Complete -  Some recent data might be hidden

## 2020-08-03 NOTE — Progress Notes (Incomplete)
HD#0 Subjective:   Overnight Events: ***  ***  Objective:   Vital signs in last 24 hours: Vitals:   08/02/20 1900 08/02/20 2300 08/03/20 0300 08/03/20 0400  BP: (!) 152/66 131/60 (!) 171/71 (!) 150/104  Pulse: 86 75 78 83  Resp: 20 (!) 25 (!) 28 (!) 21  Temp: 98.3 F (36.8 C) 98.1 F (36.7 C) 98.1 F (36.7 C)   TempSrc: Oral Oral Oral   SpO2: 95% 94% 95% 95%  Weight:   77.4 kg   Height:       Supplemental O2: {NAMES:3044014::"Room Air","Nasal Cannula","Simple Face Mask","Partial Rebreather","HFNC","Non Rebreather","Venturi Mask","Bag Valve Mask"} SpO2: 95 % O2 Flow Rate (L/min): 4 L/min FiO2 (%): 36 %  Physical Exam Constitutional: well-appearing *** sitting in chair, in no acute distress HENT: normocephalic atraumatic, mucous membranes moist Eyes: conjunctiva non-erythematous Neck: supple Cardiovascular: regular rate and rhythm, no m/r/g Pulmonary/Chest: normal work of breathing on room air, lungs clear to auscultation bilaterally Abdominal: soft, non-tender, non-distended MSK: normal bulk and tone Neurological: alert & oriented x 3, 5/5 strength in bilateral upper and lower extremities, normal gait Skin: *** Psych: ***  Filed Weights   08/02/20 0604 08/02/20 1641 08/03/20 0300  Weight: 75.8 kg 75.6 kg 77.4 kg     Intake/Output Summary (Last 24 hours) at 08/03/2020 0600 Last data filed at 08/02/2020 1700 Gross per 24 hour  Intake 469.97 ml  Output 1200 ml  Net -730.03 ml   Net IO Since Admission: -730.03 mL [08/03/20 0600]  Pertinent Labs: CBC Latest Ref Rng & Units 08/03/2020 08/02/2020 08/02/2020  WBC 4.0 - 10.5 K/uL 6.1 6.2 -  Hemoglobin 12.0 - 15.0 g/dL 10.7(L) 11.3(L) 11.2(L)  Hematocrit 36.0 - 46.0 % 33.0(L) 35.5(L) 33.0(L)  Platelets 150 - 400 K/uL 220 211 -    CMP Latest Ref Rng & Units 08/03/2020 08/02/2020 08/02/2020  Glucose 70 - 99 mg/dL 160(H) - -  BUN 8 - 23 mg/dL 19 - -  Creatinine 0.44 - 1.00 mg/dL 1.51(H) 1.57(H) -  Sodium 135 - 145 mmol/L  140 - 142  Potassium 3.5 - 5.1 mmol/L 3.6 - 4.2  Chloride 98 - 111 mmol/L 108 - -  CO2 22 - 32 mmol/L 22 - -  Calcium 8.9 - 10.3 mg/dL 8.7(L) - -  Total Protein 6.5 - 8.1 g/dL - - -  Total Bilirubin 0.3 - 1.2 mg/dL - - -  Alkaline Phos 38 - 126 U/L - - -  AST 15 - 41 U/L - - -  ALT 0 - 44 U/L - - -    Imaging: DG Chest Portable 1 View  Result Date: 08/02/2020 CLINICAL DATA:  65 year old female with shortness of breath, poor oxygenation on room air. Diaphoresis. EXAM: PORTABLE CHEST 1 VIEW COMPARISON:  PA chest radiograph 06/06/2020 and earlier. FINDINGS: Portable AP upright view at 0612 hours. Stable lung volumes. Mediastinal contours are stable and within normal limits. Curvilinear scarring or atelectasis in the left mid lung is chronic and stable. No superimposed pneumothorax. Pulmonary vascularity appears stable and within normal limits. No pleural effusion or acute pulmonary opacity identified. Visualized tracheal air column is within normal limits. No acute osseous abnormality identified. IMPRESSION: No acute cardiopulmonary abnormality on portable chest. Chronic left mid lung scarring/atelectasis. Electronically Signed   By: Genevie Ann M.D.   On: 08/02/2020 06:40    {medication reviewed/display:3041432}  Assessment/Plan:   Active Problems:   Hypertensive emergency   Patient Summary: [name] is a 65 y.o. *** with  a pertinent PMH of ***, who presented with *** and was admitted for ***.    *** ***  *** ***  *** ***  *** ***  Diet: {NAMES:3044014::"Normal","Heart Healthy","Carb-Modified","Renal","Carb/Renal","NPO","TPN","Tube Feeds"} IVF: {NAMES:3044014::"None","NS","1/2 NS","LR","D5","D10"},{NAMES:3044014::"None","10cc/hr","25cc/hr","50cc/hr","75cc/hr","100cc/hr","110cc/hr","125cc/hr","Bolus"} VTE: {NAMES:3044014::"Heparin","Enoxaparin","SCDs","NOAC","None"} Code: {NAMES:3044014::"Full","DNR","DNI","DNR/DNI","Comfort Care","Unknown"} PT/OT recs:  {NAMES:3044014::"None","Pending","CIR","SNF for Subacute PT","LTAC","Home Health"}, {Assistive Devices FA:[id number]. TOC recs: ***   Dispo: Anticipated discharge to {Discharge Destination:18313::"Home"} in {NUMBERS:20191} days pending ***.    Please contact the on call pager after 5 pm and on weekends at [phone].  Alexandria Lodge, MD PGY-1 Internal Medicine Teaching Service Pager: [phone] 08/03/2020

## 2020-08-03 NOTE — TOC Transition Note (Signed)
Transition of Care [facility]) - CM/SW Discharge Note   Patient Details  Name: [name] MRN: [id number] Date of Birth: May 18, 1955  [facility]) CM/SW Contact:  Zenon Mayo, RN Phone Number: 08/03/2020, 3:11 PM   Clinical Narrative:    She is set up with outpatient physical therapy on [address], this is listed on the AVS. She has transport home tomorrow.    Final next level of care: Home/Self Care Barriers to Discharge: No Barriers Identified   Patient Goals and CMS Choice Patient states their goals for this hospitalization and ongoing recovery are:: return home   Choice offered to / list presented to : NA  Discharge Placement                       Discharge Plan and Services                  DME Agency: NA       HH Arranged: NA          Social Determinants of Health (SDOH) Interventions     Readmission Risk Interventions Readmission Risk Prevention Plan 08/03/2020 06/12/2020  Transportation Screening Complete Complete  PCP or Specialist Appt within 5-7 Days - Complete  PCP or Specialist Appt within 3-5 Days Complete -  Home Care Screening - Complete  Medication Review (RN CM) - Complete  HRI or Home Care Consult Complete -  Social Work Consult for [place] Planning/Counseling Complete -  Palliative Care Screening Not Applicable -  Medication Review Press photographer) Complete -  Some recent data might be hidden

## 2020-08-03 NOTE — Discharge Instructions (Signed)
You were admitted from April 3-4 for a hypertensive emergency which is when you have a dramatic increase in blood pressure resulting in fluid rushing into your lungs causing your sudden shortness of breath. Fortunately, this was able to be corrected by medication and the breathing machine. I would strongly encourage you to make sure to minimize your salt intake and weigh yourself daily. Be sure to call the heart failure clinic if you develop a sudden weight increase.

## 2020-08-04 ENCOUNTER — Telehealth: Payer: Self-pay

## 2020-08-04 NOTE — Telephone Encounter (Signed)
Transition Care Management Unsuccessful Follow-up Telephone Call  Date of discharge and from where:  08/03/2020 from [facility]  Attempts:  1st Attempt  Reason for unsuccessful TCM follow-up call:  Left voice message

## 2020-08-06 NOTE — Telephone Encounter (Signed)
Transition Care Management Unsuccessful Follow-up Telephone Call  Date of discharge and from where:  08/03/2020 from [facility]  Attempts:  2nd Attempt  Reason for unsuccessful TCM follow-up call:  Left voice message

## 2020-08-07 ENCOUNTER — Encounter: Payer: Medicaid Other | Admitting: Student

## 2020-08-07 NOTE — Telephone Encounter (Signed)
Transition Care Management Unsuccessful Follow-up Telephone Call  Date of discharge and from where:  08/03/2020 from [facility]  Attempts:  3rd Attempt  Reason for unsuccessful TCM follow-up call:  Unable to reach patient

## 2020-08-13 ENCOUNTER — Other Ambulatory Visit (HOSPITAL_COMMUNITY): Payer: Self-pay

## 2020-08-13 ENCOUNTER — Other Ambulatory Visit: Payer: Self-pay | Admitting: Internal Medicine

## 2020-08-14 ENCOUNTER — Other Ambulatory Visit (HOSPITAL_COMMUNITY): Payer: Self-pay | Admitting: Internal Medicine

## 2020-08-14 ENCOUNTER — Telehealth: Payer: Self-pay | Admitting: Internal Medicine

## 2020-08-14 ENCOUNTER — Other Ambulatory Visit (HOSPITAL_COMMUNITY): Payer: Self-pay

## 2020-08-14 MED ORDER — ENTRESTO 49-51 MG PO TABS
1.0000 | ORAL_TABLET | Freq: Two times a day (BID) | ORAL | 11 refills | Status: AC
  Filled 2020-08-14: qty 60, 30d supply, fill #0
  Filled 2020-10-20: qty 60, 30d supply, fill #1
  Filled 2020-11-10 – 2020-11-12 (×2): qty 60, 30d supply, fill #2
  Filled 2021-01-18: qty 60, 30d supply, fill #3

## 2020-08-14 MED ORDER — INSULIN GLARGINE 100 UNIT/ML SOLOSTAR PEN
20.0000 [IU] | PEN_INJECTOR | Freq: Every day | SUBCUTANEOUS | 0 refills | Status: DC
  Filled 2020-08-14: qty 6, 30d supply, fill #0

## 2020-08-14 NOTE — Telephone Encounter (Signed)
[facility] C[facility] 24 HOUR LINE  Pt called requesting a refill on lantus. I reviewed her chart. Last OV for diabetes was 02/16/21 with Dr. Coy Saunas at which time her A1C had increased from 7.8 to 8.9. She was instructed to follow up in 3 months however the chart indicates that this did not happen.  I will send in the lantus and encouraged her to follow up in our clinic. Message sent to the front desk to ensure that this gets arranged.  Mitzi Hansen, MD Internal Medicine Resident PGY-2 [facility] Pager: [phone] 08/14/2020 11:42 AM

## 2020-08-15 ENCOUNTER — Other Ambulatory Visit (HOSPITAL_COMMUNITY): Payer: Self-pay

## 2020-08-17 ENCOUNTER — Other Ambulatory Visit (HOSPITAL_COMMUNITY): Payer: Self-pay

## 2020-08-18 ENCOUNTER — Other Ambulatory Visit (HOSPITAL_COMMUNITY): Payer: Self-pay

## 2020-08-19 ENCOUNTER — Other Ambulatory Visit (HOSPITAL_COMMUNITY): Payer: Self-pay

## 2020-08-20 ENCOUNTER — Other Ambulatory Visit (HOSPITAL_COMMUNITY): Payer: Self-pay

## 2020-08-20 MED FILL — Metformin HCl Tab ER 24HR 500 MG: ORAL | 30 days supply | Qty: 120 | Fill #0 | Status: AC

## 2020-08-24 ENCOUNTER — Other Ambulatory Visit (HOSPITAL_COMMUNITY): Payer: Self-pay | Admitting: Internal Medicine

## 2020-08-24 ENCOUNTER — Other Ambulatory Visit: Payer: Self-pay | Admitting: Interventional Cardiology

## 2020-08-24 ENCOUNTER — Other Ambulatory Visit (HOSPITAL_COMMUNITY): Payer: Self-pay

## 2020-08-24 MED ORDER — ISOSORBIDE MONONITRATE ER 120 MG PO TB24
120.0000 mg | ORAL_TABLET | Freq: Every day | ORAL | 1 refills | Status: DC
  Filled 2020-08-24: qty 90, 90d supply, fill #0

## 2020-08-25 ENCOUNTER — Other Ambulatory Visit (HOSPITAL_COMMUNITY): Payer: Self-pay

## 2020-08-25 ENCOUNTER — Encounter: Payer: Medicaid Other | Admitting: Internal Medicine

## 2020-08-27 ENCOUNTER — Encounter: Payer: Self-pay | Admitting: Internal Medicine

## 2020-08-27 ENCOUNTER — Ambulatory Visit (INDEPENDENT_AMBULATORY_CARE_PROVIDER_SITE_OTHER): Payer: Medicaid Other | Admitting: Internal Medicine

## 2020-08-27 ENCOUNTER — Other Ambulatory Visit (HOSPITAL_COMMUNITY): Payer: Self-pay

## 2020-08-27 ENCOUNTER — Other Ambulatory Visit: Payer: Self-pay

## 2020-08-27 VITALS — BP 169/79 | HR 82 | Temp 98.7°F | Wt 169.4 lb

## 2020-08-27 DIAGNOSIS — E1165 Type 2 diabetes mellitus with hyperglycemia: Secondary | ICD-10-CM | POA: Diagnosis not present

## 2020-08-27 DIAGNOSIS — I5042 Chronic combined systolic (congestive) and diastolic (congestive) heart failure: Secondary | ICD-10-CM | POA: Diagnosis not present

## 2020-08-27 DIAGNOSIS — E1151 Type 2 diabetes mellitus with diabetic peripheral angiopathy without gangrene: Secondary | ICD-10-CM | POA: Diagnosis not present

## 2020-08-27 DIAGNOSIS — E1122 Type 2 diabetes mellitus with diabetic chronic kidney disease: Secondary | ICD-10-CM

## 2020-08-27 DIAGNOSIS — IMO0002 Reserved for concepts with insufficient information to code with codable children: Secondary | ICD-10-CM

## 2020-08-27 DIAGNOSIS — N1831 Chronic kidney disease, stage 3a: Secondary | ICD-10-CM | POA: Diagnosis not present

## 2020-08-27 DIAGNOSIS — I10 Essential (primary) hypertension: Secondary | ICD-10-CM | POA: Diagnosis present

## 2020-08-27 DIAGNOSIS — M1611 Unilateral primary osteoarthritis, right hip: Secondary | ICD-10-CM | POA: Diagnosis not present

## 2020-08-27 MED ORDER — ISOSORBIDE MONONITRATE ER 120 MG PO TB24
120.0000 mg | ORAL_TABLET | Freq: Every day | ORAL | 1 refills | Status: DC
  Filled 2020-08-27: qty 90, 90d supply, fill #0
  Filled 2020-11-10: qty 90, 90d supply, fill #1

## 2020-08-27 MED ORDER — METFORMIN HCL ER 500 MG PO TB24
1000.0000 mg | ORAL_TABLET | Freq: Two times a day (BID) | ORAL | 2 refills | Status: DC
  Filled 2020-08-27: qty 360, 90d supply, fill #0

## 2020-08-27 MED ORDER — TRAMADOL HCL 50 MG PO TABS
50.0000 mg | ORAL_TABLET | Freq: Every day | ORAL | 0 refills | Status: DC | PRN
  Filled 2020-08-27: qty 5, 5d supply, fill #0
  Filled 2020-10-20: qty 5, 5d supply, fill #1
  Filled 2020-11-10: qty 5, 5d supply, fill #2

## 2020-08-27 MED ORDER — CARVEDILOL 12.5 MG PO TABS
12.5000 mg | ORAL_TABLET | Freq: Two times a day (BID) | ORAL | 2 refills | Status: DC
  Filled 2020-08-27: qty 60, 30d supply, fill #0
  Filled 2020-11-10: qty 60, 30d supply, fill #1

## 2020-08-27 NOTE — Assessment & Plan Note (Signed)
Hemoglobin A1c 11.9 46-monthago.  Unable to download patient's blood sugar readings from her meter.  States that she has been checking her blood sugars at home and they typically range from 120s to 140s however at times above 200 if she is not taking her medications.  Currently on Lantus 20 units, Jardiance 25 mg and metformin 1000 mg twice daily.  Stressed the importance of taking these medications and continuing to use her meter.  Would like to follow-up in 1 month to attempt re downloading her blood sugar reads.  At this time we will need to determine if her regimen needs to be adjusted.  Stressed the importance of getting her diabetes under control in order for her to be a surgical candidate for her hip replacement.  Plan: Continue Lantus, Jardiance and metformin Follow-up in 1 month and recheck blood glucose meter readings

## 2020-08-27 NOTE — Progress Notes (Signed)
   CC: Hospital follow-up  HPI:  [name] is a 65 y.o. with a past medical history listed below presenting for follow-up after recent hospitalization.  Presenting for evaluation of hypertension, diabetes and osteoarthritis. For details of today's visit and the status of his chronic medical issues please refer to the assessment and plan.   Past Medical History:  Diagnosis Date  . Abdominal fullness 09/26/2019  . Anemia 07/17/2013  . Arthritis    "hands, shoulders, hips, legs" (05/10/2018)  . Arthritis of left hip 08/18/2013   S/p total hip arthroplasty.    . CHF (congestive heart failure) ([place])    2014...@ [facility]  . Chronic lower back pain   . CKD (chronic kidney disease) stage 3, GFR 30-59 ml/min (HCC)   . COPD (chronic obstructive pulmonary disease) ([place])   . COVID-19 04/13/2020  . Healthcare maintenance 06/16/2015  . High cholesterol   . Hypertension   . Influenza A 06/01/2017  . Influenza, pneumonia 06/01/2017  . Left flank pain 10/11/2016  . Myocardial infarction [facility])    "been told that I've had one; don't know when it would have been" (05/10/2018)  . Problem with sexual relationship 10/13/2017  . PVD (peripheral vascular disease) ([place])   . Shortness of breath 03/12/2019  . Type II diabetes mellitus ([place])    diagnosed 2001   Review of Systems:   Review of Systems  Respiratory: Negative for cough and shortness of breath.   Cardiovascular: Negative for chest pain, palpitations and leg swelling.  Gastrointestinal: Negative for abdominal pain, nausea and vomiting.  Neurological: Negative for dizziness and headaches.     Physical Exam:  Vitals:   08/27/20 1012  BP: (!) 169/79  Pulse: 82  Temp: 98.7 F (37.1 C)  TempSrc: Oral  SpO2: 100%  Weight: 169 lb 6.4 oz (76.8 kg)   Physical Exam Vitals reviewed.  Constitutional:      General: She is not in acute distress.    Appearance: Normal appearance. She is not ill-appearing.  Cardiovascular:     Rate and Rhythm:  Normal rate and regular rhythm.     Pulses: Normal pulses.     Heart sounds: Normal heart sounds.  Pulmonary:     Effort: Pulmonary effort is normal. No respiratory distress.     Breath sounds: Normal breath sounds. No wheezing or rales.  Abdominal:     General: Abdomen is flat. Bowel sounds are normal. There is no distension.     Palpations: Abdomen is soft.     Tenderness: There is no abdominal tenderness. There is no guarding.  Musculoskeletal:        General: No swelling or tenderness.     Right lower leg: No edema.     Left lower leg: No edema.  Skin:    General: Skin is warm and dry.  Neurological:     Mental Status: She is alert and oriented to person, place, and time.  Psychiatric:        Mood and Affect: Mood normal.        Behavior: Behavior normal.        Thought Content: Thought content normal.        Judgment: Judgment normal.     Assessment & Plan:   See Encounters Tab for problem based charting.  Patient discussed with Dr. Evette Doffing

## 2020-08-27 NOTE — Assessment & Plan Note (Signed)
Patient reports significant bilateral hip pain, right greater than left.  States that she had a left hip replacement many years ago however now it is bothering her.  States that she has significant pain that limits her ADLs and ambulation.  She takes Tylenol which provides some relief.  She states that working with physical therapy in the past helped her pain.  Discussed the importance of improving her blood pressure and diabetes in order for physical therapy to work with her as well as for her to be a surgical candidate for hip replacement.  In the meantime we will refer her to orthopedics for possible bilateral hips steroid injection and also physical therapy.  She is requesting tramadol for days she is having severe pain as this is helped control her symptoms in the past.  Plan: Referral to physical therapy Referral to orthopedic surgery for possible hip injections and possible hip replacements in the future once her diabetes and hypertension is better controlled Short-term course of tramadol 50 mg daily for severe pain, 15 tablets

## 2020-08-27 NOTE — Assessment & Plan Note (Addendum)
BP Readings from Last 3 Encounters:  08/27/20 (!) 169/79  08/03/20 (!) 166/69  07/09/20 120/70   Patient's blood pressure elevated today.  Reports that she did not take her blood pressure medications this morning.  She was recently hospitalized due to hypertensive urgency.  No changes to her medications were made at discharge.  She states that she remembers to take her medications however sometimes her prescription runs out and there is some gaps before she is able to get it refilled.  States that she does not forget to take her medications and uses a pillbox.  Her daughter lives nearby and sometimes comes by to help her.  Denies any chest pain, shortness of breath headaches or leg swelling today.  Discussed the importance of taking her blood pressure medications.  Also discussed that until her blood pressure is controlled it will be difficult to work with physical therapy or get her hip replacement surgery.  Patient expressed understanding.  Will continue to encourage using her medications and follow-up in about 4 weeks for blood pressure check.  Plan: Continue current antihypertensive regimen Follow-up in 4 weeks for BP check

## 2020-08-27 NOTE — Patient Instructions (Signed)
Thank you for allowing [place] to provide your care today. Today we discussed your hypertension, diabetes and osteoarthritis.    For your bilateral hip osteoarthritis I put in a referral to orthopedics and physical therapy.  It is really important that we get both your blood pressure and diabetes under control.  It will be difficult to work with physical therapy and schedule your hip replacement surgeries if we do not have good blood pressure control and improvement in your diabetes.  Today we made no changes to your medications.    Please follow-up in 2-4 weeks.    Should you have any questions or concerns please call the internal medicine clinic at [phone].

## 2020-08-28 ENCOUNTER — Other Ambulatory Visit (HOSPITAL_COMMUNITY): Payer: Self-pay

## 2020-08-28 ENCOUNTER — Other Ambulatory Visit: Payer: Self-pay | Admitting: Student

## 2020-08-28 MED FILL — Nitroglycerin SL Tab 0.4 MG: SUBLINGUAL | 30 days supply | Qty: 25 | Fill #0 | Status: AC

## 2020-08-30 DIAGNOSIS — U071 COVID-19: Secondary | ICD-10-CM | POA: Diagnosis not present

## 2020-08-31 NOTE — Progress Notes (Signed)
Internal Medicine Clinic Attending ° °Case discussed with Dr. Rehman  At the time of the visit.  We reviewed the resident’s history and exam and pertinent patient test results.  I agree with the assessment, diagnosis, and plan of care documented in the resident’s note.  ° °

## 2020-09-01 ENCOUNTER — Other Ambulatory Visit (HOSPITAL_COMMUNITY): Payer: Self-pay

## 2020-09-01 ENCOUNTER — Other Ambulatory Visit: Payer: Self-pay | Admitting: Internal Medicine

## 2020-09-02 ENCOUNTER — Other Ambulatory Visit: Payer: Self-pay | Admitting: Student

## 2020-09-02 ENCOUNTER — Other Ambulatory Visit (HOSPITAL_COMMUNITY): Payer: Self-pay

## 2020-09-02 DIAGNOSIS — I214 Non-ST elevation (NSTEMI) myocardial infarction: Secondary | ICD-10-CM

## 2020-09-02 MED ORDER — TICAGRELOR 90 MG PO TABS
90.0000 mg | ORAL_TABLET | Freq: Two times a day (BID) | ORAL | 0 refills | Status: DC
  Filled 2020-09-02: qty 60, 30d supply, fill #0

## 2020-09-02 NOTE — Telephone Encounter (Signed)
Actually, it appears that Dr. Clayborne Dana note in March said to continue DAPT for NSTEMI, so will refill 30-day supply at this time until she sees cardiology.

## 2020-09-02 NOTE — Telephone Encounter (Signed)
It appears that this patient's brilinta was discontinued on 08/03/2020, so will not refill prescription at this time.

## 2020-09-03 ENCOUNTER — Other Ambulatory Visit (HOSPITAL_COMMUNITY): Payer: Self-pay

## 2020-09-14 ENCOUNTER — Telehealth: Payer: Self-pay | Admitting: Internal Medicine

## 2020-09-14 NOTE — Telephone Encounter (Signed)
..   Medicaid Managed Care   Unsuccessful Outreach Note  09/14/2020 Name: [name] MRN: [id number] DOB: May 25, 1955  Referred by: Marty Heck, DO Reason for referral : High Risk Managed Medicaid (Attempted to reach patient today to get her scheduled with the [facility].)   An unsuccessful telephone outreach was attempted today. The patient was referred to the case management team for assistance with care management and care coordination.   Follow Up Plan: The care management team will reach out to the patient again over the next 7-14 days.   [place]

## 2020-09-25 ENCOUNTER — Other Ambulatory Visit (HOSPITAL_COMMUNITY): Payer: Self-pay

## 2020-09-30 DIAGNOSIS — U071 COVID-19: Secondary | ICD-10-CM | POA: Diagnosis not present

## 2020-10-15 ENCOUNTER — Encounter: Payer: Self-pay | Admitting: *Deleted

## 2020-10-20 ENCOUNTER — Other Ambulatory Visit (HOSPITAL_COMMUNITY): Payer: Self-pay

## 2020-10-20 ENCOUNTER — Other Ambulatory Visit: Payer: Self-pay | Admitting: Student

## 2020-10-20 DIAGNOSIS — I214 Non-ST elevation (NSTEMI) myocardial infarction: Secondary | ICD-10-CM

## 2020-10-20 MED FILL — Spironolactone Tab 25 MG: ORAL | 90 days supply | Qty: 45 | Fill #0 | Status: AC

## 2020-10-20 MED FILL — Omeprazole Cap Delayed Release 20 MG: ORAL | 30 days supply | Qty: 30 | Fill #0 | Status: AC

## 2020-10-21 ENCOUNTER — Other Ambulatory Visit (HOSPITAL_COMMUNITY): Payer: Self-pay

## 2020-10-21 MED ORDER — TICAGRELOR 90 MG PO TABS
90.0000 mg | ORAL_TABLET | Freq: Two times a day (BID) | ORAL | 0 refills | Status: DC
  Filled 2020-10-21: qty 60, 30d supply, fill #0

## 2020-10-22 NOTE — Addendum Note (Signed)
Addended by: Hulan Fray on: 10/22/2020 05:01 PM   Modules accepted: Orders

## 2020-10-29 ENCOUNTER — Other Ambulatory Visit (HOSPITAL_COMMUNITY): Payer: Self-pay

## 2020-10-30 DIAGNOSIS — U071 COVID-19: Secondary | ICD-10-CM | POA: Diagnosis not present

## 2020-11-07 ENCOUNTER — Inpatient Hospital Stay (HOSPITAL_COMMUNITY)
Admission: EM | Admit: 2020-11-07 | Discharge: 2020-11-11 | DRG: 062 | Disposition: A | Discharge status: Home / Self Care | Payer: Medicaid Other | Attending: Neurology | Admitting: Neurology

## 2020-11-07 ENCOUNTER — Inpatient Hospital Stay (HOSPITAL_COMMUNITY): Payer: Medicaid Other

## 2020-11-07 ENCOUNTER — Encounter (HOSPITAL_COMMUNITY): Payer: Self-pay | Admitting: Emergency Medicine

## 2020-11-07 ENCOUNTER — Emergency Department (HOSPITAL_COMMUNITY): Payer: Medicaid Other

## 2020-11-07 ENCOUNTER — Other Ambulatory Visit: Payer: Self-pay

## 2020-11-07 DIAGNOSIS — K219 Gastro-esophageal reflux disease without esophagitis: Secondary | ICD-10-CM | POA: Diagnosis present

## 2020-11-07 DIAGNOSIS — N1832 Chronic kidney disease, stage 3b: Secondary | ICD-10-CM | POA: Diagnosis present

## 2020-11-07 DIAGNOSIS — M19042 Primary osteoarthritis, left hand: Secondary | ICD-10-CM | POA: Diagnosis present

## 2020-11-07 DIAGNOSIS — M19041 Primary osteoarthritis, right hand: Secondary | ICD-10-CM | POA: Diagnosis present

## 2020-11-07 DIAGNOSIS — I5042 Chronic combined systolic (congestive) and diastolic (congestive) heart failure: Secondary | ICD-10-CM | POA: Diagnosis present

## 2020-11-07 DIAGNOSIS — Z7984 Long term (current) use of oral hypoglycemic drugs: Secondary | ICD-10-CM

## 2020-11-07 DIAGNOSIS — I5021 Acute systolic (congestive) heart failure: Secondary | ICD-10-CM | POA: Diagnosis not present

## 2020-11-07 DIAGNOSIS — I252 Old myocardial infarction: Secondary | ICD-10-CM

## 2020-11-07 DIAGNOSIS — M545 Low back pain, unspecified: Secondary | ICD-10-CM | POA: Diagnosis not present

## 2020-11-07 DIAGNOSIS — G8929 Other chronic pain: Secondary | ICD-10-CM | POA: Diagnosis present

## 2020-11-07 DIAGNOSIS — R29810 Facial weakness: Secondary | ICD-10-CM | POA: Diagnosis not present

## 2020-11-07 DIAGNOSIS — Z9851 Tubal ligation status: Secondary | ICD-10-CM

## 2020-11-07 DIAGNOSIS — M1611 Unilateral primary osteoarthritis, right hip: Secondary | ICD-10-CM | POA: Diagnosis present

## 2020-11-07 DIAGNOSIS — I63412 Cerebral infarction due to embolism of left middle cerebral artery: Secondary | ICD-10-CM | POA: Diagnosis not present

## 2020-11-07 DIAGNOSIS — E1165 Type 2 diabetes mellitus with hyperglycemia: Secondary | ICD-10-CM | POA: Diagnosis present

## 2020-11-07 DIAGNOSIS — Z7982 Long term (current) use of aspirin: Secondary | ICD-10-CM

## 2020-11-07 DIAGNOSIS — E1122 Type 2 diabetes mellitus with diabetic chronic kidney disease: Secondary | ICD-10-CM | POA: Diagnosis present

## 2020-11-07 DIAGNOSIS — I63511 Cerebral infarction due to unspecified occlusion or stenosis of right middle cerebral artery: Secondary | ICD-10-CM | POA: Diagnosis not present

## 2020-11-07 DIAGNOSIS — Z7902 Long term (current) use of antithrombotics/antiplatelets: Secondary | ICD-10-CM

## 2020-11-07 DIAGNOSIS — I63231 Cerebral infarction due to unspecified occlusion or stenosis of right carotid arteries: Secondary | ICD-10-CM | POA: Diagnosis not present

## 2020-11-07 DIAGNOSIS — I130 Hypertensive heart and chronic kidney disease with heart failure and stage 1 through stage 4 chronic kidney disease, or unspecified chronic kidney disease: Secondary | ICD-10-CM | POA: Diagnosis not present

## 2020-11-07 DIAGNOSIS — M19011 Primary osteoarthritis, right shoulder: Secondary | ICD-10-CM | POA: Diagnosis not present

## 2020-11-07 DIAGNOSIS — M19012 Primary osteoarthritis, left shoulder: Secondary | ICD-10-CM | POA: Diagnosis not present

## 2020-11-07 DIAGNOSIS — Z8616 Personal history of COVID-19: Secondary | ICD-10-CM | POA: Diagnosis not present

## 2020-11-07 DIAGNOSIS — I509 Heart failure, unspecified: Secondary | ICD-10-CM | POA: Diagnosis not present

## 2020-11-07 DIAGNOSIS — I639 Cerebral infarction, unspecified: Secondary | ICD-10-CM | POA: Diagnosis not present

## 2020-11-07 DIAGNOSIS — R29703 NIHSS score 3: Secondary | ICD-10-CM | POA: Diagnosis not present

## 2020-11-07 DIAGNOSIS — I63232 Cerebral infarction due to unspecified occlusion or stenosis of left carotid arteries: Secondary | ICD-10-CM | POA: Diagnosis not present

## 2020-11-07 DIAGNOSIS — E785 Hyperlipidemia, unspecified: Secondary | ICD-10-CM | POA: Diagnosis not present

## 2020-11-07 DIAGNOSIS — I6523 Occlusion and stenosis of bilateral carotid arteries: Secondary | ICD-10-CM | POA: Diagnosis not present

## 2020-11-07 DIAGNOSIS — I63532 Cerebral infarction due to unspecified occlusion or stenosis of left posterior cerebral artery: Secondary | ICD-10-CM | POA: Diagnosis not present

## 2020-11-07 DIAGNOSIS — H538 Other visual disturbances: Secondary | ICD-10-CM | POA: Diagnosis not present

## 2020-11-07 DIAGNOSIS — Z20822 Contact with and (suspected) exposure to covid-19: Secondary | ICD-10-CM | POA: Diagnosis present

## 2020-11-07 DIAGNOSIS — I2510 Atherosclerotic heart disease of native coronary artery without angina pectoris: Secondary | ICD-10-CM | POA: Diagnosis present

## 2020-11-07 DIAGNOSIS — J449 Chronic obstructive pulmonary disease, unspecified: Secondary | ICD-10-CM | POA: Diagnosis present

## 2020-11-07 DIAGNOSIS — N183 Chronic kidney disease, stage 3 unspecified: Secondary | ICD-10-CM | POA: Diagnosis not present

## 2020-11-07 DIAGNOSIS — I63531 Cerebral infarction due to unspecified occlusion or stenosis of right posterior cerebral artery: Secondary | ICD-10-CM | POA: Diagnosis not present

## 2020-11-07 DIAGNOSIS — I6389 Other cerebral infarction: Secondary | ICD-10-CM | POA: Diagnosis not present

## 2020-11-07 DIAGNOSIS — Z79899 Other long term (current) drug therapy: Secondary | ICD-10-CM

## 2020-11-07 DIAGNOSIS — Z833 Family history of diabetes mellitus: Secondary | ICD-10-CM

## 2020-11-07 DIAGNOSIS — I255 Ischemic cardiomyopathy: Secondary | ICD-10-CM | POA: Diagnosis not present

## 2020-11-07 DIAGNOSIS — Z888 Allergy status to other drugs, medicaments and biological substances status: Secondary | ICD-10-CM

## 2020-11-07 DIAGNOSIS — Z794 Long term (current) use of insulin: Secondary | ICD-10-CM

## 2020-11-07 DIAGNOSIS — G8194 Hemiplegia, unspecified affecting left nondominant side: Secondary | ICD-10-CM | POA: Diagnosis present

## 2020-11-07 DIAGNOSIS — R29818 Other symptoms and signs involving the nervous system: Secondary | ICD-10-CM | POA: Diagnosis not present

## 2020-11-07 DIAGNOSIS — Z87891 Personal history of nicotine dependence: Secondary | ICD-10-CM | POA: Diagnosis not present

## 2020-11-07 DIAGNOSIS — Z955 Presence of coronary angioplasty implant and graft: Secondary | ICD-10-CM

## 2020-11-07 DIAGNOSIS — I10 Essential (primary) hypertension: Secondary | ICD-10-CM | POA: Diagnosis not present

## 2020-11-07 DIAGNOSIS — E7800 Pure hypercholesterolemia, unspecified: Secondary | ICD-10-CM | POA: Diagnosis not present

## 2020-11-07 DIAGNOSIS — R4781 Slurred speech: Secondary | ICD-10-CM | POA: Diagnosis present

## 2020-11-07 LAB — COMPREHENSIVE METABOLIC PANEL
ALT: 15 U/L (ref 0–44)
AST: 25 U/L (ref 15–41)
Albumin: 2.8 g/dL — ABNORMAL LOW (ref 3.5–5.0)
Alkaline Phosphatase: 90 U/L (ref 38–126)
Anion gap: 15 (ref 5–15)
BUN: 26 mg/dL — ABNORMAL HIGH (ref 8–23)
CO2: 16 mmol/L — ABNORMAL LOW (ref 22–32)
Calcium: 8.8 mg/dL — ABNORMAL LOW (ref 8.9–10.3)
Chloride: 105 mmol/L (ref 98–111)
Creatinine, Ser: 2.18 mg/dL — ABNORMAL HIGH (ref 0.44–1.00)
GFR, Estimated: 25 mL/min — ABNORMAL LOW (ref >60)
Glucose, Bld: 501 mg/dL (ref 70–99)
Potassium: 4.7 mmol/L (ref 3.5–5.1)
Sodium: 136 mmol/L (ref 135–145)
Total Bilirubin: 0.5 mg/dL (ref 0.3–1.2)
Total Protein: 6.3 g/dL — ABNORMAL LOW (ref 6.5–8.1)

## 2020-11-07 LAB — APTT: aPTT: 34 seconds (ref 24–36)

## 2020-11-07 LAB — CBC
HCT: 41 % (ref 36.0–46.0)
Hemoglobin: 12.3 g/dL (ref 12.0–15.0)
MCH: 28.9 pg (ref 26.0–34.0)
MCHC: 30 g/dL (ref 30.0–36.0)
MCV: 96.5 fL (ref 80.0–100.0)
Platelets: 160 10*3/uL (ref 150–400)
RBC: 4.25 MIL/uL (ref 3.87–5.11)
RDW: 15.2 % (ref 11.5–15.5)
WBC: 5.3 10*3/uL (ref 4.0–10.5)
nRBC: 0 % (ref 0.0–0.2)

## 2020-11-07 LAB — PROTIME-INR
INR: 1.3 — ABNORMAL HIGH (ref 0.8–1.2)
Prothrombin Time: 15.7 seconds — ABNORMAL HIGH (ref 11.4–15.2)

## 2020-11-07 LAB — CBG MONITORING, ED: Glucose-Capillary: 473 mg/dL — ABNORMAL HIGH (ref 70–99)

## 2020-11-07 LAB — MRSA NEXT GEN BY PCR, NASAL: MRSA by PCR Next Gen: NOT DETECTED

## 2020-11-07 LAB — DIFFERENTIAL
Abs Immature Granulocytes: 0.02 10*3/uL (ref 0.00–0.07)
Basophils Absolute: 0 10*3/uL (ref 0.0–0.1)
Basophils Relative: 1 %
Eosinophils Absolute: 0 10*3/uL (ref 0.0–0.5)
Eosinophils Relative: 0 %
Immature Granulocytes: 0 %
Lymphocytes Relative: 27 %
Lymphs Abs: 1.4 10*3/uL (ref 0.7–4.0)
Monocytes Absolute: 0.4 10*3/uL (ref 0.1–1.0)
Monocytes Relative: 7 %
Neutro Abs: 3.4 10*3/uL (ref 1.7–7.7)
Neutrophils Relative %: 65 %

## 2020-11-07 LAB — HEMOGLOBIN A1C
Hgb A1c MFr Bld: 9.2 % — ABNORMAL HIGH (ref 4.8–5.6)
Mean Plasma Glucose: 217.34 mg/dL

## 2020-11-07 LAB — GLUCOSE, CAPILLARY: Glucose-Capillary: 193 mg/dL — ABNORMAL HIGH (ref 70–99)

## 2020-11-07 MED ORDER — PANTOPRAZOLE SODIUM 40 MG IV SOLR
40.0000 mg | Freq: Every day | INTRAVENOUS | Status: DC
  Administered 2020-11-07: 40 mg via INTRAVENOUS
  Filled 2020-11-07: qty 40

## 2020-11-07 MED ORDER — SODIUM CHLORIDE 0.9% FLUSH
3.0000 mL | Freq: Once | INTRAVENOUS | Status: AC
  Administered 2020-11-07: 3 mL via INTRAVENOUS

## 2020-11-07 MED ORDER — ACETAMINOPHEN 325 MG PO TABS: 650.0000 mg | ORAL_TABLET | ORAL | Status: DC | PRN

## 2020-11-07 MED ORDER — LABETALOL HCL 5 MG/ML IV SOLN
20.0000 mg | INTRAVENOUS | Status: DC | PRN
  Administered 2020-11-07 – 2020-11-09 (×3): 20 mg via INTRAVENOUS
  Filled 2020-11-07 (×3): qty 4

## 2020-11-07 MED ORDER — SODIUM CHLORIDE 0.9 % IV SOLN
50.0000 mL | Freq: Once | INTRAVENOUS | Status: AC
  Administered 2020-11-07: 50 mL via INTRAVENOUS

## 2020-11-07 MED ORDER — SENNOSIDES-DOCUSATE SODIUM 8.6-50 MG PO TABS
1.0000 | ORAL_TABLET | Freq: Two times a day (BID) | ORAL | Status: DC
  Administered 2020-11-07 – 2020-11-11 (×5): 1 via ORAL
  Filled 2020-11-07 (×6): qty 1

## 2020-11-07 MED ORDER — STROKE: EARLY STAGES OF RECOVERY BOOK
Freq: Once | Status: AC
  Filled 2020-11-07: qty 1

## 2020-11-07 MED ORDER — LABETALOL HCL 5 MG/ML IV SOLN
INTRAVENOUS | Status: AC | PRN
  Administered 2020-11-07: 10 mg via INTRAVENOUS

## 2020-11-07 MED ORDER — INSULIN ASPART 100 UNIT/ML IJ SOLN
0.0000 [IU] | INTRAMUSCULAR | Status: DC
  Administered 2020-11-07: 11 [IU] via SUBCUTANEOUS
  Administered 2020-11-07: 3 [IU] via SUBCUTANEOUS
  Administered 2020-11-08 (×2): 2 [IU] via SUBCUTANEOUS

## 2020-11-07 MED ORDER — ACETAMINOPHEN 650 MG RE SUPP: 650.0000 mg | RECTAL | Status: DC | PRN

## 2020-11-07 MED ORDER — ACETAMINOPHEN 160 MG/5ML PO SOLN: 650.0000 mg | ORAL | Status: DC | PRN

## 2020-11-07 MED ORDER — ALTEPLASE (STROKE) FULL DOSE INFUSION
0.9000 mg/kg | Freq: Once | INTRAVENOUS | Status: AC
  Administered 2020-11-07: 66.5 mg via INTRAVENOUS
  Filled 2020-11-07: qty 100

## 2020-11-07 MED ORDER — IOHEXOL 350 MG/ML SOLN
75.0000 mL | Freq: Once | INTRAVENOUS | Status: AC | PRN
  Administered 2020-11-07: 75 mL via INTRAVENOUS

## 2020-11-07 MED ORDER — SODIUM CHLORIDE 0.9 % IV SOLN: INTRAVENOUS | Status: DC

## 2020-11-07 NOTE — ED Notes (Signed)
[facility] called to activate Stroke per Richarda Blade, RN at [phone]

## 2020-11-07 NOTE — ED Triage Notes (Signed)
Pt arrived via POV.  Daughter reports that she was with pt getting her tire serviced.  Left from there around 3:30pm and pt drove to ATM with daughter following in another car.  She was behind her at [place] and noticed it was taking her awhile so she got out and went to check on her.  Reports slurred speech and facial droop.  Pt with R sided gaze.  Follows commands.  Pt taken straight to bridge and code stroke activated.  Dr. Alvino Chapel at bridge to assess airway.

## 2020-11-07 NOTE — H&P (Signed)
Neurology consult   CC: code stroke.  History is obtained from: ED MD.   HPI:  [name] is a 65 yo female with a PMHx of CHF, CKD III, COPD, HLD, HTN, CAD s/p NSTEMI, and DM II. Patient arrived in her own car and after triage, a code stroke was called by ED MD. When neurology responded, patient was being brought into CT suite.   Today, patient was going to get her car serviced and daughter was following her. Patient stopped at ATM and daughter noticed patient was having difficulty using the ATM. Daughter went to check on patient and found her with slurred speech and a facial droop. Patient with right gaze preference on arrival. Follows commands.     Patient takes Brilinta (allergic to Plavix) by cardiology. On no other blood thinners. No personal history or FMHx of stroke on chart. Last PCP visit 08/27/20 for post hospitalization for HTN emergency and pulmonary edema. No changes to HTN regimen.   CTH negative for bleed/acute finding. CTA head and neck   [id number]  hours tpa given?: Yes IR Thrombectomy?: MRS:1  NIHSS:  1a Level of Consciousness: 0 1b LOC Questions: 1 1c LOC Commands: 0 2 Best Gaze: 1 3 Visual: 0 4 Facial Palsy: 1 5a Motor Arm - left: 0 5b Motor Arm - Right: 0 6a Motor Leg - Left: 0 6b Motor Leg - Right: 0 7 Limb Ataxia: 0 8 Sensory: 0 9 Best Language: 0 10 Dysarthria: 0 11 Extinction and Inattention: 0 TOTAL:  3  ROS: A robust ROS was unable to be performed due to emergent nature of event.   Past Medical History:  Diagnosis Date   Abdominal fullness 09/26/2019   Anemia 07/17/2013   Arthritis    "hands, shoulders, hips, legs" (05/10/2018)   Arthritis of left hip 08/18/2013   S/p total hip arthroplasty.     CHF (congestive heart failure) ([place])    2014...@ [facility]   Chronic lower back pain    CKD (chronic kidney disease) stage 3, GFR 30-59 ml/min (HCC)    COPD (chronic obstructive pulmonary disease) ([place])    COVID-19 04/13/2020   Healthcare maintenance  06/16/2015   High cholesterol    Hypertension    Hypertensive emergency 08/02/2020   Hypertensive urgency 08/03/2020   Influenza A 06/01/2017   Influenza, pneumonia 06/01/2017   Left flank pain 10/11/2016   Myocardial infarction [facility])    "been told that I've had one; don't know when it would have been" (05/10/2018)   Problem with sexual relationship 10/13/2017   PVD (peripheral vascular disease) ([place])    Shortness of breath 03/12/2019   Type II diabetes mellitus ([place])    diagnosed 2001   Family History  Problem Relation Age of Onset   Diabetes Mother    Social History:  reports that she quit smoking about 33 years ago. Her smoking use included cigarettes. She has never used smokeless tobacco. She reports previous alcohol use. She reports previous drug use.   Prior to Admission medications   Medication Sig Start Date End Date Taking? Authorizing Provider  Accu-Chek FastClix Lancets MISC Check blood sugar up to  3x/day before meals 09/05/18   Seawell, Jaimie A, DO  acetaminophen (TYLENOL) 500 MG tablet Take 1,000 mg by mouth every 6 (six) hours as needed for mild pain, fever or headache.    [provider]  albuterol (VENTOLIN HFA) 108 (90 Base) MCG/ACT inhaler INHALE 2 PUFFS INTO THE LUNGS EVERY 6 (SIX) HOURS AS  NEEDED FOR WHEEZING OR SHORTNESS OF BREATH. 06/24/20   Cato Mulligan, MD  aspirin EC 81 MG tablet Take 1 tablet (81 mg total) by mouth daily. IM program, [facility] 04/22/16   Norman Herrlich, MD  atorvastatin (LIPITOR) 80 MG tablet Take 80 mg by mouth at bedtime.    [provider]  blood glucose meter kit and supplies KIT Dispense based on patient and insurance preference. Use up to four times daily as directed. (FOR ICD-9 250.00, 250.01). 02/28/18   Kathi Ludwig, MD  Blood Glucose Monitoring Suppl (ACCU-CHEK GUIDE) w/Device KIT 1 each by Does not apply route 3 (three) times daily. 09/05/18   Seawell, Jaimie A, DO  carvedilol (COREG) 12.5 MG tablet Take 1 tablet  (12.5 mg total) by mouth 2 (two) times daily with a meal. 08/27/20   Rehman, Areeg N, DO  empagliflozin (JARDIANCE) 25 MG TABS tablet TAKE 1 TABLET (25 MG TOTAL) BY MOUTH DAILY. 02/17/20 02/16/21  Lacinda Axon, MD  furosemide (LASIX) 20 MG tablet Take 20 mg by mouth as needed for fluid or edema.    [provider]  insulin glargine (LANTUS) 100 UNIT/ML Solostar Pen Inject 20 Units into the skin daily. 08/14/20 09/13/20  Mitzi Hansen, MD  Insulin Pen Needle (INSUPEN PEN NEEDLES) 32G X 4 MM MISC 1 Units by Does not apply route daily. 06/12/20   Iona Beard, MD  isosorbide mononitrate (IMDUR) 120 MG 24 hr tablet Take 1 tablet (120 mg total) by mouth daily. 08/27/20   Rehman, Areeg N, DO  metFORMIN (GLUCOPHAGE-XR) 500 MG 24 hr tablet Take 2 tablets (1,000 mg total) by mouth in the morning and at bedtime. 08/27/20 05/24/21  Rehman, Areeg N, DO  Multiple Vitamins-Minerals (MULTIVITAMIN GUMMIES ADULT) CHEW Chew 2 tablets by mouth daily.    [provider]  nitroGLYCERIN (NITROSTAT) 0.4 MG SL tablet Place 1 tablet (0.4 mg total) under the tongue every 5 (five) minutes as needed for chest pain. 05/15/20 08/13/20  [name], MD  nitroGLYCERIN (NITROSTAT) 0.4 MG SL tablet PLACE 1 TABLET (0.4 MG TOTAL) UNDER THE TONGUE EVERY 5 (FIVE) MINUTES AS NEEDED FOR CHEST PAIN. 05/15/20 05/15/21  [name], MD  omeprazole (PRILOSEC) 20 MG capsule TAKE 1 CAPSULE BY MOUTH DAILY. Patient taking differently: Take 20 mg by mouth daily. 07/16/20   [place], Carley D, DO  omeprazole (PRILOSEC) 20 MG capsule Take 1 capsule (20 mg total) by mouth daily. 07/16/20   [place], Carley D, DO  sacubitril-valsartan (ENTRESTO) 49-51 MG TAKE 1 TABLET BY MOUTH 2 (TWO) TIMES DAILY 08/14/20 08/14/21  Bensimhon, Shaune Pascal, MD  spironolactone (ALDACTONE) 25 MG tablet Take 0.5 tablets (12.5 mg total) by mouth daily. 07/09/20   Bensimhon, Shaune Pascal, MD  spironolactone (ALDACTONE) 25 MG tablet Take 0.5 tablets (12.5 mg  total) by mouth daily. 07/09/20   Bensimhon, Shaune Pascal, MD  ticagrelor (BRILINTA) 90 MG TABS tablet Take 1 tablet (90 mg total) by mouth 2 (two) times daily. 10/21/20   Cato Mulligan, MD  tiZANidine (ZANAFLEX) 4 MG tablet Take 1 tablet (4 mg total) by mouth every 6 (six) hours as needed for muscle spasms. 02/17/20   Lacinda Axon, MD  traMADol (ULTRAM) 50 MG tablet Take 1 tablet (50 mg total) by mouth daily as needed for severe pain. 08/27/20   Axel Filler, MD  UNIFINE PENTIPS 31G X 5 MM MISC USE TO INJECT 20 UNITS DAILY 04/09/19   Seawell, Laurell Roof, DO    Exam:  Current vital signs: BP (!) 184/94   Pulse 91   Temp 98.1 F (36.7 C)   Resp 15   Wt 73.9 kg   SpO2 96%   BMI 28.86 kg/m   Physical Exam  Constitutional: Appears well-developed and well-nourished.  Psych: Affect appropriate to situation. Eyes: No scleral injection. HENT: No OP obstruction. Head: Normocephalic.  Cardiovascular: Normal rate and regular rhythm.  Respiratory: Effort normal.  GI: Abdomen soft.  No distension. There is no tenderness.  Skin: WDI  Neuro: Mental Status: Patient is awake, alert, oriented to person, place, month, year, and situation. Patient is able to give a clear and coherent history. No signs of neglect. Speech/Language:  Speech is clear, fluent without dysarthria or aphasia. Repetition, naming, and comprehension intact.  Cranial Nerves: II: Visual Fields are full. Pupils are equal, round, and reactive to light.  III,IV, VI: EOMI without ptosis or diploplia. Blink to threat.   V: Facial sensation is symmetric to light touch in V1, V2, and V3 VII: Facial movement symmetrical.  VIII: hearing is intact to voice. X: Uvula elevates symmetrically. XI: Shoulder shrug is symmetric. XII: tongue is midline without atrophy or fasciculations.  Motor: She has 5/5 strength to confrontation but I question a mild left motor neglect Sensory: Sensation is symmetric to light touch in all  fours extremities. Extinction absent to light touch DSS.  Plantars: Toes are downgoing bilaterally.  Cerebellar: No ataxia noted with FNF and HKS bilaterally. Finger roll without ataxia.    CTA head and neck -no LVO but with significant carotid stenosis  Assessment: 65 yo female with several stroke risk factors: DM II, CAD, HTN, and HLD. CTH negative for acute in CT suite. tPA infused after MD reviewed tPA exclusion/inclusion criteria. CTA head and neck showed no LVO.   Impression:  -stroke.  -s/p tPA.    Plan: - Neuro admit.  - MRI brain without contrast 24 hours after tPA administration.  - Recommend TTE. - Recommend labs: HbA1c, lipid panel, TSH. - Recommend Statin or increased dose if LDL > 70.  -No AC, antiplatelets, or chemical VTE x 24 hours.  -SCDs. -Euglycemia.  -Avoid hyperthermia. Tylenol prn.  - Aspirin 51m daily to resume per stroke team discretion.  - Brilinta to be resumed per stoke team discretion.  - SBP goal - post tPA BP < 180/105.  - Telemetry monitoring for arrhythmia. - NPO until passes bedside Swallow screen. - Stroke education. - PT/OT/SLP consult. - NIHSS as per protocol. - frequent neuro checks.  -No non compressible sticks x 24 hours or tube placement -Bleeding precautions.  -Stat CTH for any change in neuro exam.  - Recommend metabolic/infectious workup with UA with UCx, CXR, CK, serum lactate. -Stroke team to follow in a.m.    Patient seen by KClance Boll MSN, APN-BC, nurse practitioner and by MD. Note/plan to be edited by MD as needed.  Pager: [phone]    I have seen the patient and reviewed the above note.  She has a severe right gaze preference, though she can cross midline, it is most the time deviated to the right.  She also has some decreased use of her left arm,?  Motor neglect given that she does not have much weakness to confrontation and does not drift, but when trying to stand up to transfer to the CT table she is  unable to stand on her own and unable to support her self very much with the left arm.  Given the gaze  deviation and the difficulties that she had standing, I did feel that this was a disabling deficit and therefore discussed risks and benefits of IV tPA with both the patient and her daughter who both agreed with proceeding.   This patient is critically ill and at significant risk of neurological worsening, death and care requires constant monitoring of vital signs, hemodynamics,respiratory and cardiac monitoring, neurological assessment, discussion with family, other specialists and medical decision making of high complexity. I spent 50 minutes of neurocritical care time  in the care of  this patient. This was time spent independent of any time provided by nurse practitioner or PA.  Roland Rack, MD [facility] [phone]  If 7pm- 7am, please page neurology on call as listed in [place]. 11/07/2020  6:46 PM

## 2020-11-07 NOTE — ED Provider Notes (Signed)
[facility] EMERGENCY DEPARTMENT Provider Note   CSN: [id number] Arrival date & time: 11/07/20  6314  An emergency department physician performed an initial assessment on this suspected stroke patient at 21.  History Chief Complaint  Patient presents with   Code Stroke    [name] is a 65 y.o. female.  65 year old female with prior medical history as detailed below presents for evaluation.  Patient was last known normal at 3:30 PM.  Patient left a tire service center and drove to an ATM.  Patient's daughter was with her.  She noted that her mother was very slow with the ATM.  At that time patient's daughter noted the patient's rightward gaze, slurred speech, and facial droop.  Patient was brought by private vehicle to the ED.  Patient called as a code stroke on arrival.  The history is provided by the patient, a relative and medical records.  Cerebrovascular Accident This is a new problem. The current episode started 1 to 2 hours ago. The problem occurs rarely. The problem has not changed since onset.Pertinent negatives include no chest pain, no abdominal pain, no headaches and no shortness of breath. Nothing aggravates the symptoms. Nothing relieves the symptoms.      Past Medical History:  Diagnosis Date   Abdominal fullness 09/26/2019   Anemia 07/17/2013   Arthritis    "hands, shoulders, hips, legs" (05/10/2018)   Arthritis of left hip 08/18/2013   S/p total hip arthroplasty.     CHF (congestive heart failure) ([place])    2014...@ [facility]   Chronic lower back pain    CKD (chronic kidney disease) stage 3, GFR 30-59 ml/min (HCC)    COPD (chronic obstructive pulmonary disease) ([place])    COVID-19 04/13/2020   Healthcare maintenance 06/16/2015   High cholesterol    Hypertension    Hypertensive emergency 08/02/2020   Hypertensive urgency 08/03/2020   Influenza A 06/01/2017   Influenza, pneumonia 06/01/2017   Left flank pain 10/11/2016   Myocardial infarction [facility])     "been told that I've had one; don't know when it would have been" (05/10/2018)   Problem with sexual relationship 10/13/2017   PVD (peripheral vascular disease) ([place])    Shortness of breath 03/12/2019   Type II diabetes mellitus ([place])    diagnosed 2001    Patient Active Problem List   Diagnosis Date Noted   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   Hyperglycemia    NSTEMI (non-ST elevated myocardial infarction) ([place]) 06/06/2020   Musculoskeletal neck pain 02/17/2020   Incomplete uterine prolapse 11/23/2018   GERD (gastroesophageal reflux disease) 11/14/2018   Mild depression ([employer]) 04/11/2018   Nonischemic cardiomyopathy ([place]) 08/31/2017   Acute respiratory failure with hypoxia ([place]) 06/02/2017   Pain in toes of both feet 01/26/2017   Bilateral carotid artery disease ([place]) 11/10/2016   Seasonal allergies 06/30/2016   Health care maintenance 06/16/2015   Nonproliferative diabetic retinopathy associated with type 2 diabetes mellitus ([place]) 06/15/2015   Osteoarthritis of right hip 06/09/2015   Type II diabetes mellitus with stage 3 chronic kidney disease ([place]) 07/17/2013   Chronic combined systolic and diastolic CHF, NYHA class 2 ([place]) 07/07/2013   CKD (chronic kidney disease) stage 3, GFR 30-59 ml/min ([place]) 11/22/2012   HTN (hypertension) 11/08/2012    Past Surgical History:  Procedure Laterality Date   ABDOMINAL ANGIOGRAM  12/28/2012   ABDOMINAL ANGIOGRAM N/A 12/29/2011   Procedure: ABDOMINAL ANGIOGRAM;  Surgeon: Conrad [place], MD;  Location: [facility] CATH LAB;  Service:  Cardiovascular;  Laterality: N/A;   ABDOMINAL AORTAGRAM N/A 12/21/2012   Procedure: ABDOMINAL Maxcine Ham;  Surgeon: Elam Dutch, MD;  Location: [facility] CATH LAB;  Service: Cardiovascular;  Laterality: N/A;   CORONARY STENT INTERVENTION N/A 06/11/2020   Procedure: CORONARY STENT INTERVENTION;  Surgeon: [name], MD;  Location: [facility] CV LAB;  Service: Cardiovascular;  Laterality: N/A;   EYE SURGERY Bilateral ~ 2018    "laser; related to my diabetes"   JOINT REPLACEMENT     PERIPHERAL VASCULAR CATHETERIZATION     "had to have some veins unclogged cause I was in pain when I walked" (05/10/2018)   RIGHT/LEFT HEART CATH AND CORONARY ANGIOGRAPHY N/A 04/30/2018   Procedure: RIGHT/LEFT HEART CATH AND CORONARY ANGIOGRAPHY;  Surgeon: [name], MD;  Location: [place] [facility] CV LAB;  Service: Cardiovascular;  Laterality: N/A;   RIGHT/LEFT HEART CATH AND CORONARY ANGIOGRAPHY N/A 06/09/2020   Procedure: RIGHT/LEFT HEART CATH AND CORONARY ANGIOGRAPHY;  Surgeon: [name], MD;  Location: [place] CV LAB;  Service: Cardiovascular;  Laterality: N/A;   TOTAL HIP ARTHROPLASTY Left 08/19/2013   Procedure: TOTAL HIP ARTHROPLASTY;  Surgeon: Kerin Salen, MD;  Location: [facility];  Service: Orthopedics;  Laterality: Left;   TUBAL LIGATION       OB History   No obstetric history on file.     Family History  Problem Relation Age of Onset   Diabetes Mother     Social History   Tobacco Use   Smoking status: Former    Years: 7.00    Pack years: 0.00    Types: Cigarettes    Quit date: 05/08/1987    Years since quitting: 33.5   Smokeless tobacco: Never   Tobacco comments:    05/10/2018 "smoked when I drank"  Vaping Use   Vaping Use: Never used  Substance Use Topics   Alcohol use: Not Currently    Alcohol/week: 0.0 standard drinks    Comment: 05/10/2018 "glass of wine on holidays"   Drug use: Not Currently    Home Medications Prior to Admission medications   Medication Sig Start Date End Date Taking? Authorizing Provider  Accu-Chek FastClix Lancets MISC Check blood sugar up to  3x/day before meals 09/05/18   Seawell, Jaimie A, DO  acetaminophen (TYLENOL) 500 MG tablet Take 1,000 mg by mouth every 6 (six) hours as needed for mild pain, fever or headache.    [provider]  albuterol (VENTOLIN HFA) 108 (90 Base) MCG/ACT inhaler INHALE 2 PUFFS INTO THE LUNGS EVERY 6 (SIX) HOURS AS NEEDED FOR WHEEZING OR  SHORTNESS OF BREATH. 06/24/20   Cato Mulligan, MD  aspirin EC 81 MG tablet Take 1 tablet (81 mg total) by mouth daily. IM program, [facility] 04/22/16   Norman Herrlich, MD  atorvastatin (LIPITOR) 80 MG tablet Take 80 mg by mouth at bedtime.    [provider]  blood glucose meter kit and supplies KIT Dispense based on patient and insurance preference. Use up to four times daily as directed. (FOR ICD-9 250.00, 250.01). 02/28/18   Kathi Ludwig, MD  Blood Glucose Monitoring Suppl (ACCU-CHEK GUIDE) w/Device KIT 1 each by Does not apply route 3 (three) times daily. 09/05/18   Seawell, Jaimie A, DO  carvedilol (COREG) 12.5 MG tablet Take 1 tablet (12.5 mg total) by mouth 2 (two) times daily with a meal. 08/27/20   Rehman, Areeg N, DO  empagliflozin (JARDIANCE) 25 MG TABS tablet TAKE 1 TABLET (25 MG TOTAL) BY  MOUTH DAILY. 02/17/20 02/16/21  Lacinda Axon, MD  furosemide (LASIX) 20 MG tablet Take 20 mg by mouth as needed for fluid or edema.    [provider]  insulin glargine (LANTUS) 100 UNIT/ML Solostar Pen Inject 20 Units into the skin daily. 08/14/20 09/13/20  Mitzi Hansen, MD  Insulin Pen Needle (INSUPEN PEN NEEDLES) 32G X 4 MM MISC 1 Units by Does not apply route daily. 06/12/20   Iona Beard, MD  isosorbide mononitrate (IMDUR) 120 MG 24 hr tablet Take 1 tablet (120 mg total) by mouth daily. 08/27/20   Rehman, Areeg N, DO  metFORMIN (GLUCOPHAGE-XR) 500 MG 24 hr tablet Take 2 tablets (1,000 mg total) by mouth in the morning and at bedtime. 08/27/20 05/24/21  Rehman, Areeg N, DO  Multiple Vitamins-Minerals (MULTIVITAMIN GUMMIES ADULT) CHEW Chew 2 tablets by mouth daily.    [provider]  nitroGLYCERIN (NITROSTAT) 0.4 MG SL tablet Place 1 tablet (0.4 mg total) under the tongue every 5 (five) minutes as needed for chest pain. 05/15/20 08/13/20  [name], MD  nitroGLYCERIN (NITROSTAT) 0.4 MG SL tablet PLACE 1 TABLET (0.4 MG TOTAL) UNDER THE TONGUE EVERY 5 (FIVE)  MINUTES AS NEEDED FOR CHEST PAIN. 05/15/20 05/15/21  [name], MD  omeprazole (PRILOSEC) 20 MG capsule TAKE 1 CAPSULE BY MOUTH DAILY. Patient taking differently: Take 20 mg by mouth daily. 07/16/20   [place], Carley D, DO  omeprazole (PRILOSEC) 20 MG capsule Take 1 capsule (20 mg total) by mouth daily. 07/16/20   [place], Carley D, DO  sacubitril-valsartan (ENTRESTO) 49-51 MG TAKE 1 TABLET BY MOUTH 2 (TWO) TIMES DAILY 08/14/20 08/14/21  Bensimhon, Shaune Pascal, MD  spironolactone (ALDACTONE) 25 MG tablet Take 0.5 tablets (12.5 mg total) by mouth daily. 07/09/20   Bensimhon, Shaune Pascal, MD  spironolactone (ALDACTONE) 25 MG tablet Take 0.5 tablets (12.5 mg total) by mouth daily. 07/09/20   Bensimhon, Shaune Pascal, MD  ticagrelor (BRILINTA) 90 MG TABS tablet Take 1 tablet (90 mg total) by mouth 2 (two) times daily. 10/21/20   Cato Mulligan, MD  tiZANidine (ZANAFLEX) 4 MG tablet Take 1 tablet (4 mg total) by mouth every 6 (six) hours as needed for muscle spasms. 02/17/20   Lacinda Axon, MD  traMADol (ULTRAM) 50 MG tablet Take 1 tablet (50 mg total) by mouth daily as needed for severe pain. 08/27/20   Axel Filler, MD  UNIFINE PENTIPS 31G X 5 MM MISC USE TO INJECT 20 UNITS DAILY 04/09/19   Seawell, Jaimie A, DO    Allergies    Lisinopril and Plavix [clopidogrel bisulfate]  Review of Systems   Review of Systems  Unable to perform ROS: Acuity of condition  Respiratory:  Negative for shortness of breath.   Cardiovascular:  Negative for chest pain.  Gastrointestinal:  Negative for abdominal pain.  Neurological:  Negative for headaches.   Physical Exam Updated Vital Signs BP (!) 162/86   Pulse 85   Temp 98.8 F (37.1 C)   Resp (!) 21   Wt 73.9 kg   SpO2 97%   BMI 28.86 kg/m   Physical Exam Vitals and nursing note reviewed.  Constitutional:      General: She is not in acute distress.    Appearance: Normal appearance. She is well-developed.  HENT:     Head: Normocephalic and  atraumatic.  Eyes:     Conjunctiva/sclera: Conjunctivae normal.     Pupils: Pupils are equal, round, and reactive to light.  Cardiovascular:  Rate and Rhythm: Normal rate and regular rhythm.     Heart sounds: Normal heart sounds.  Pulmonary:     Effort: Pulmonary effort is normal. No respiratory distress.     Breath sounds: Normal breath sounds.  Abdominal:     General: There is no distension.     Palpations: Abdomen is soft.     Tenderness: There is no abdominal tenderness.  Musculoskeletal:        General: No deformity. Normal range of motion.     Cervical back: Normal range of motion and neck supple.  Skin:    General: Skin is warm and dry.  Neurological:     Mental Status: She is alert and oriented to person, place, and time.     Comments: AO x3  Mild slurred speech  Persistent rightward gaze  Left leg and arm weakness    ED Results / Procedures / Treatments   Labs (all labs ordered are listed, but only abnormal results are displayed) Labs Reviewed  CBG MONITORING, ED - Abnormal; Notable for the following components:      Result Value   Glucose-Capillary 473 (*)    All other components within normal limits  RESP PANEL BY RT-PCR (FLU A&B, COVID) ARPGX2  CBC  DIFFERENTIAL  COMPREHENSIVE METABOLIC PANEL  PROTIME-INR  APTT  HEMOGLOBIN A1C  LIPID PANEL  I-STAT CHEM 8, ED    EKG EKG Interpretation  Date/Time:  Saturday November 07 2020 16:38:23 EDT Ventricular Rate:  90 PR Interval:  146 QRS Duration: 106 QT Interval:  388 QTC Calculation: 474 R Axis:   40 Text Interpretation: Normal sinus rhythm Septal infarct , age undetermined ST & T wave abnormality, consider inferolateral ischemia Abnormal ECG Confirmed by Dene Gentry [phone]) on 11/07/2020 4:56:27 PM  Radiology CT HEAD CODE STROKE WO CONTRAST  Result Date: 11/07/2020 CLINICAL DATA:  Code stroke.  Neuro deficit, acute stroke suspected. EXAM: CT HEAD WITHOUT CONTRAST TECHNIQUE: Contiguous axial images  were obtained from the base of the skull through the vertex without intravenous contrast. COMPARISON:  October 03, 2007. FINDINGS: Brain: Remote lacunar infarct in the right thalamus and right basal ganglia, present on the prior CT head. No evidence of acute large vascular territory infarct or acute hemorrhage. Progressive patchy white matter hypoattenuation, most likely related to chronic microvascular ischemic disease. Progressive ventriculomegaly, which is mildly out of proportion to the degree of sulcal volume loss. Additionally, the callosum angle is somewhat acute and there is crowding of sulci at the vertex Vascular: No hyperdense vessel identified. Calcific atherosclerosis. Skull: No acute fracture. Sinuses/Orbits: Moderate mucosal thickening. No acute orbital findings. Other: No mastoid effusions. ASPECTS [facility] Stroke Program Early CT Score) total score (0-10 with 10 being normal): 10. IMPRESSION: 1. No evidence of acute large vascular territory infarct or acute hemorrhage. ASPECTS is 10 2. Progressive white matter hypoattenuation, most likely related to chronic microvascular ischemic disease. An MRI could provide more sensitive evaluation for acute white matter infarct if clinically indicated. 3. Remote infarcts in the right thalamus, right basal ganglia. 4. Progressive ventriculomegaly, which is mildly out of proportion to the degree of sulcal volume loss. Additionally, the callosal angle is somewhat acute and there is crowding of sulci at the vertex. Consider normal pressure hydrocephalus. Central predominant atrophy with ex vacuo ventricular dilation is a differential consideration. Findings discussed with Dr. Leonel Ramsay at 5:06 p.m. Electronically Signed   By: Margaretha Sheffield MD   On: 11/07/2020 17:11    Procedures Procedures   CRITICAL  CARE Performed by: Valarie Merino   Total critical care time: 30 minutes  Critical care time was exclusive of separately billable procedures and treating  other patients.  Critical care was necessary to treat or prevent imminent or life-threatening deterioration.  Critical care was time spent personally by me on the following activities: development of treatment plan with patient and/or surrogate as well as nursing, discussions with consultants, evaluation of patient's response to treatment, examination of patient, obtaining history from patient or surrogate, ordering and performing treatments and interventions, ordering and review of laboratory studies, ordering and review of radiographic studies, pulse oximetry and re-evaluation of patient's condition.   Medications Ordered in ED Medications  sodium chloride flush (NS) 0.9 % injection 3 mL (has no administration in time range)  alteplase (ACTIVASE) 1 mg/mL infusion 66.5 mg (has no administration in time range)    Followed by  0.9 %  sodium chloride infusion (has no administration in time range)   stroke: mapping our early stages of recovery book (has no administration in time range)  0.9 %  sodium chloride infusion (has no administration in time range)  acetaminophen (TYLENOL) tablet 650 mg (has no administration in time range)    Or  acetaminophen (TYLENOL) 160 MG/5ML solution 650 mg (has no administration in time range)    Or  acetaminophen (TYLENOL) suppository 650 mg (has no administration in time range)  senna-docusate (Senokot-S) tablet 1 tablet (has no administration in time range)  pantoprazole (PROTONIX) injection 40 mg (has no administration in time range)  iohexol (OMNIPAQUE) 350 MG/ML injection 75 mL (75 mLs Intravenous Contrast Given 11/07/20 1736)    ED Course  I have reviewed the triage vital signs and the nursing notes.  Pertinent labs & imaging results that were available during my care of the patient were reviewed by me and considered in my medical decision making (see chart for details).    MDM Rules/Calculators/A&P                          MDM  MSE  complete  [name] was evaluated in Emergency Department on 11/07/2020 for the symptoms described in the history of present illness. She was evaluated in the context of the global COVID-19 pandemic, which necessitated consideration that the patient might be at risk for infection with the SARS-CoV-2 virus that causes COVID-19. Institutional protocols and algorithms that pertain to the evaluation of patients at risk for COVID-19 are in a state of rapid change based on information released by regulatory bodies including the CDC and federal and state organizations. These policies and algorithms were followed during the patient's care in the ED.  Patient is presenting with acute neurologic change.  Presentation is consistent with likely CVA.  Code stroke initiated on arrival.  Neuro team with Dr. Leonel Ramsay evaluated the patient on arrival.  tPA administered.  Patient to be admitted to the neuro team for further work-up and eval.   Final Clinical Impression(s) / ED Diagnoses Final diagnoses:  Cerebrovascular accident (CVA), unspecified mechanism [facility])    Rx / DC Orders ED Discharge Orders     None        Valarie Merino, MD 11/07/20 1806

## 2020-11-07 NOTE — Code Documentation (Signed)
Stroke Response Nurse Documentation Code Documentation  [name] is a 65 y.o. female arriving to [place] ED via Private Vehicle on 11/07/2020 with past medical hx of HTN, CKD, CHF, DM. Code stroke was activated by ED.  Patient from home where she was LKW at 1530 and now complaining of right facial droop and right gaze . On Brilinta (ticagrelor) 90 mg bid. Stroke team at the bedside on patient arrival. Labs drawn and patient cleared for CT by Dr. Francia Greaves. Patient to CT with team. NIHSS 3, see documentation for details and code stroke times. Patient with disoriented, right gaze preference , and left facial droop on exam. The following imaging was completed:  CT, CTA head and neck. Patient is a candidate for tPA. TPA given at 1721 '10mg'$  labetalol given at 1805 for BP 185/99. Care/Plan. Bedside handoff with ED RN Donia Guiles.    Raliegh Ip  Stroke Response RN

## 2020-11-07 NOTE — Progress Notes (Signed)
PHARMACIST CODE STROKE RESPONSE  Notified to mix tPA at 1657 by Dr. Leonel Ramsay Delivered tPA to RN at 1659  tPA dose = 6.'7mg'$  bolus over 1 minute followed by 59.'8mg'$  for a total dose of 66.'5mg'$  over 1 hour  Issues/delays encountered (if applicable): IV access  [name] 11/07/20 6:17 PM

## 2020-11-08 ENCOUNTER — Inpatient Hospital Stay (HOSPITAL_COMMUNITY): Payer: Medicaid Other

## 2020-11-08 ENCOUNTER — Other Ambulatory Visit (HOSPITAL_COMMUNITY): Payer: Medicaid Other

## 2020-11-08 DIAGNOSIS — N1832 Chronic kidney disease, stage 3b: Secondary | ICD-10-CM | POA: Diagnosis not present

## 2020-11-08 DIAGNOSIS — I6389 Other cerebral infarction: Secondary | ICD-10-CM

## 2020-11-08 DIAGNOSIS — I639 Cerebral infarction, unspecified: Secondary | ICD-10-CM | POA: Diagnosis not present

## 2020-11-08 DIAGNOSIS — E7800 Pure hypercholesterolemia, unspecified: Secondary | ICD-10-CM | POA: Diagnosis not present

## 2020-11-08 DIAGNOSIS — E1165 Type 2 diabetes mellitus with hyperglycemia: Secondary | ICD-10-CM | POA: Diagnosis not present

## 2020-11-08 DIAGNOSIS — I6523 Occlusion and stenosis of bilateral carotid arteries: Secondary | ICD-10-CM | POA: Diagnosis not present

## 2020-11-08 DIAGNOSIS — I255 Ischemic cardiomyopathy: Secondary | ICD-10-CM | POA: Diagnosis not present

## 2020-11-08 DIAGNOSIS — I10 Essential (primary) hypertension: Secondary | ICD-10-CM

## 2020-11-08 DIAGNOSIS — I63412 Cerebral infarction due to embolism of left middle cerebral artery: Secondary | ICD-10-CM | POA: Diagnosis not present

## 2020-11-08 DIAGNOSIS — I5021 Acute systolic (congestive) heart failure: Secondary | ICD-10-CM | POA: Diagnosis not present

## 2020-11-08 LAB — GLUCOSE, CAPILLARY
Glucose-Capillary: 147 mg/dL — ABNORMAL HIGH (ref 70–99)
Glucose-Capillary: 143 mg/dL — ABNORMAL HIGH (ref 70–99)
Glucose-Capillary: 291 mg/dL — ABNORMAL HIGH (ref 70–99)
Glucose-Capillary: 234 mg/dL — ABNORMAL HIGH (ref 70–99)

## 2020-11-08 LAB — RAPID URINE DRUG SCREEN, HOSP PERFORMED
Amphetamines: NOT DETECTED
Barbiturates: NOT DETECTED
Benzodiazepines: NOT DETECTED
Cocaine: NOT DETECTED
Opiates: NOT DETECTED
Tetrahydrocannabinol: POSITIVE — AB

## 2020-11-08 LAB — LIPID PANEL
Cholesterol: 222 mg/dL — ABNORMAL HIGH (ref 0–200)
HDL: 51 mg/dL (ref >40)
LDL Cholesterol: 156 mg/dL — ABNORMAL HIGH (ref 0–99)
Total CHOL/HDL Ratio: 4.4 RATIO
Triglycerides: 75 mg/dL (ref <150)
VLDL: 15 mg/dL (ref 0–40)

## 2020-11-08 LAB — HEMOGLOBIN A1C
Hgb A1c MFr Bld: 9.2 % — ABNORMAL HIGH (ref 4.8–5.6)
Mean Plasma Glucose: 217.34 mg/dL

## 2020-11-08 LAB — TROPONIN I (HIGH SENSITIVITY)
Troponin I (High Sensitivity): 25 ng/L — ABNORMAL HIGH (ref <18)
Troponin I (High Sensitivity): 29 ng/L — ABNORMAL HIGH (ref <18)

## 2020-11-08 LAB — ECHOCARDIOGRAM COMPLETE
AR max vel: 1.89 cm2
AV Area VTI: 1.79 cm2
AV Area mean vel: 1.56 cm2
AV Mean grad: 4 mmHg
AV Peak grad: 8.3 mmHg
Ao pk vel: 1.44 m/s
Area-P 1/2: 5.27 cm2
Calc EF: 42.5 %
Height: 63 in
P 1/2 time: 373 msec
S' Lateral: 3.6 cm
Single Plane A2C EF: 45 %
Single Plane A4C EF: 37.3 %
Weight: 2571.45 oz

## 2020-11-08 LAB — RESP PANEL BY RT-PCR (FLU A&B, COVID) ARPGX2
Influenza A by PCR: NEGATIVE
Influenza B by PCR: NEGATIVE
SARS Coronavirus 2 by RT PCR: NEGATIVE

## 2020-11-08 MED ORDER — INSULIN ASPART 100 UNIT/ML IJ SOLN
2.0000 [IU] | INTRAMUSCULAR | Status: AC
  Administered 2020-11-08: 2 [IU] via SUBCUTANEOUS

## 2020-11-08 MED ORDER — PERFLUTREN LIPID MICROSPHERE
1.0000 mL | INTRAVENOUS | Status: AC | PRN
  Administered 2020-11-08: 3 mL via INTRAVENOUS
  Filled 2020-11-08: qty 10

## 2020-11-08 MED ORDER — CHLORHEXIDINE GLUCONATE CLOTH 2 % EX PADS
6.0000 | MEDICATED_PAD | Freq: Every day | CUTANEOUS | Status: DC
  Administered 2020-11-08 – 2020-11-11 (×4): 6 via TOPICAL

## 2020-11-08 MED ORDER — ATORVASTATIN CALCIUM 80 MG PO TABS
80.0000 mg | ORAL_TABLET | Freq: Every day | ORAL | Status: DC
  Administered 2020-11-08 – 2020-11-11 (×4): 80 mg via ORAL
  Filled 2020-11-08 (×4): qty 1

## 2020-11-08 MED ORDER — ASPIRIN EC 81 MG PO TBEC
81.0000 mg | DELAYED_RELEASE_TABLET | Freq: Every day | ORAL | Status: DC
  Administered 2020-11-08 – 2020-11-11 (×4): 81 mg via ORAL
  Filled 2020-11-08 (×4): qty 1

## 2020-11-08 MED ORDER — TICAGRELOR 90 MG PO TABS
90.0000 mg | ORAL_TABLET | Freq: Two times a day (BID) | ORAL | Status: DC
  Administered 2020-11-08 – 2020-11-11 (×6): 90 mg via ORAL
  Filled 2020-11-08 (×6): qty 1

## 2020-11-08 MED ORDER — INSULIN ASPART 100 UNIT/ML IJ SOLN
0.0000 [IU] | Freq: Three times a day (TID) | INTRAMUSCULAR | Status: DC
Start: 2020-11-08 — End: 2020-11-11
  Administered 2020-11-08: 5 [IU] via SUBCUTANEOUS
  Administered 2020-11-08: 8 [IU] via SUBCUTANEOUS
  Administered 2020-11-09: 5 [IU] via SUBCUTANEOUS
  Administered 2020-11-09: 2 [IU] via SUBCUTANEOUS
  Administered 2020-11-09 (×2): 5 [IU] via SUBCUTANEOUS
  Administered 2020-11-10: 3 [IU] via SUBCUTANEOUS
  Administered 2020-11-10: 8 [IU] via SUBCUTANEOUS
  Administered 2020-11-10: 11 [IU] via SUBCUTANEOUS
  Administered 2020-11-11: 2 [IU] via SUBCUTANEOUS
  Administered 2020-11-11: 3 [IU] via SUBCUTANEOUS
  Administered 2020-11-11: 5 [IU] via SUBCUTANEOUS

## 2020-11-08 MED ORDER — PANTOPRAZOLE SODIUM 40 MG PO TBEC
40.0000 mg | DELAYED_RELEASE_TABLET | Freq: Every day | ORAL | Status: DC
  Administered 2020-11-08 – 2020-11-11 (×4): 40 mg via ORAL
  Filled 2020-11-08 (×4): qty 1

## 2020-11-08 NOTE — Progress Notes (Signed)
PT Cancellation Note  Patient Details Name: [name] MRN: [id number] DOB: [birth year]   Cancelled Treatment:    Reason Eval/Treat Not Completed: Active bedrest order. Coordinated with RN to notify PT if bedrest orders are discharged later this date. Will follow-up as time permits and as appropriate.   Moishe Spice, PT, DPT Acute Rehabilitation Services  Pager: [phone] Office: [place] 11/08/2020, 8:13 AM

## 2020-11-08 NOTE — Progress Notes (Signed)
STROKE TEAM PROGRESS NOTE   SUBJECTIVE (INTERVAL HISTORY) Her RN and daughter are at the bedside.  Overall her condition is gradually improving. Per daughter, pt yesterday had acute onset left facial droop, slurry speech, garbled speech. In ER found to have right gaze preference. S/p tPA. Now pt still has mild left facial droop but slurry and gaze have resolved. Passed swallow on diet but A1C 9.2   OBJECTIVE Temp:  [98.1 F (36.7 C)-98.8 F (37.1 C)] 98.7 F (37.1 C) (07/10 0835) Pulse Rate:  [71-91] 76 (07/10 1100) Resp:  [15-27] 26 (07/10 1100) BP: (144-189)/(60-155) 170/73 (07/10 1100) SpO2:  [93 %-100 %] 97 % (07/10 1100) Weight:  [72.9 kg-73.9 kg] 72.9 kg (07/09 1900)  Recent Labs  Lab 11/07/20 1642 11/07/20 2346 11/08/20 0834  GLUCAP 473* 193* 147*   Recent Labs  Lab 11/07/20 1641  NA 136  K 4.7  CL 105  CO2 16*  GLUCOSE 501*  BUN 26*  CREATININE 2.18*  CALCIUM 8.8*   Recent Labs  Lab 11/07/20 1641  AST 25  ALT 15  ALKPHOS 90  BILITOT 0.5  PROT 6.3*  ALBUMIN 2.8*   Recent Labs  Lab 11/07/20 1641  WBC 5.3  NEUTROABS 3.4  HGB 12.3  HCT 41.0  MCV 96.5  PLT 160   No results for input(s): CKTOTAL, CKMB, CKMBINDEX, TROPONINI in the last 168 hours. Recent Labs    11/07/20 1925  LABPROT 15.7*  INR 1.3*   No results for input(s): COLORURINE, LABSPEC, PHURINE, GLUCOSEU, HGBUR, BILIRUBINUR, KETONESUR, PROTEINUR, UROBILINOGEN, NITRITE, LEUKOCYTESUR in the last 72 hours.  Invalid input(s): APPERANCEUR     Component Value Date/Time   CHOL 222 (H) 11/07/2020 2350   CHOL 149 08/31/2017 0941   TRIG 75 11/07/2020 2350   HDL 51 11/07/2020 2350   HDL 60 08/31/2017 0941   CHOLHDL 4.4 11/07/2020 2350   VLDL 15 11/07/2020 2350   LDLCALC 156 (H) 11/07/2020 2350   LDLCALC 74 08/31/2017 0941   Lab Results  Component Value Date   HGBA1C 9.2 (H) 11/07/2020      Component Value Date/Time   LABOPIA NONE DETECTED 11/08/2020 0840   COCAINSCRNUR NONE DETECTED  11/08/2020 0840   LABBENZ NONE DETECTED 11/08/2020 0840   AMPHETMU NONE DETECTED 11/08/2020 0840   THCU POSITIVE (A) 11/08/2020 0840   LABBARB NONE DETECTED 11/08/2020 0840    No results for input(s): ETH in the last 168 hours.  I have personally reviewed the radiological images below and agree with the radiology interpretations.  CT HEAD CODE STROKE WO CONTRAST  Result Date: 11/07/2020 CLINICAL DATA:  Code stroke.  Neuro deficit, acute stroke suspected. EXAM: CT HEAD WITHOUT CONTRAST TECHNIQUE: Contiguous axial images were obtained from the base of the skull through the vertex without intravenous contrast. COMPARISON:  October 03, 2007. FINDINGS: Brain: Remote lacunar infarct in the right thalamus and right basal ganglia, present on the prior CT head. No evidence of acute large vascular territory infarct or acute hemorrhage. Progressive patchy white matter hypoattenuation, most likely related to chronic microvascular ischemic disease. Progressive ventriculomegaly, which is mildly out of proportion to the degree of sulcal volume loss. Additionally, the callosum angle is somewhat acute and there is crowding of sulci at the vertex Vascular: No hyperdense vessel identified. Calcific atherosclerosis. Skull: No acute fracture. Sinuses/Orbits: Moderate mucosal thickening. No acute orbital findings. Other: No mastoid effusions. ASPECTS [facility] Stroke Program Early CT Score) total score (0-10 with 10 being normal): 10. IMPRESSION: 1. No evidence of  acute large vascular territory infarct or acute hemorrhage. ASPECTS is 10 2. Progressive white matter hypoattenuation, most likely related to chronic microvascular ischemic disease. An MRI could provide more sensitive evaluation for acute white matter infarct if clinically indicated. 3. Remote infarcts in the right thalamus, right basal ganglia. 4. Progressive ventriculomegaly, which is mildly out of proportion to the degree of sulcal volume loss. Additionally, the  callosal angle is somewhat acute and there is crowding of sulci at the vertex. Consider normal pressure hydrocephalus. Central predominant atrophy with ex vacuo ventricular dilation is a differential consideration. Findings discussed with Dr. Leonel Ramsay at 5:06 p.m. 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 This addendum was discussed with Dr. Lorrin Goodell via telephone at 7:40 p.m. Electronically Signed   By: Margaretha Sheffield MD   On: 11/07/2020 19:40   Result Date: 11/07/2020 CLINICAL DATA:  Stroke/TIA. EXAM: CT HEAD WITHOUT CONTRAST CT ANGIOGRAPHY OF THE HEAD AND NECK TECHNIQUE: Contiguous axial images were obtained from the base of the skull through the vertex without intravenous contrast. Multidetector CT imaging of the head and neck was performed using the standard protocol during bolus administration of intravenous contrast. Multiplanar CT image reconstructions and MIPs were obtained to evaluate the vascular anatomy. Carotid stenosis measurements (when applicable) are obtained utilizing NASCET criteria, using the distal internal carotid diameter as the denominator. CONTRAST:  66m OMNIPAQUE IOHEXOL 350 MG/ML SOLN COMPARISON:  Same day CT head. FINDINGS: CTA NECK Aortic arch: Great vessel origins are patent. Right carotid system: Calcific and noncalcific atherosclerosis at the carotid bifurcation with 50-60% stenosis of the ICA origin. Left carotid system: Calcific and noncalcific atherosclerosis involving the carotid bifurcation and proximal ICA with approximately 70%. Vertebral arteries:Mildly right dominant. There is moderate stenosis of the right vertebral artery at the C6-C7 level due to mass effect from an adjacent osteophyte Skeleton:  Moderate to severe multilevel degenerative change. Other neck: No acute abnormality.  Visualized lung apices are clear. CTA HEAD Anterior circulation: Extensive calcific atherosclerosis of bilateral cavernous and paraclinoid ICAs with severe right and moderate left cavernous and paraclinoid ICA stenosis. Bilateral MCAs are patent. Bilateral ACAs are patent. No aneurysm identified. Posterior circulation: Bilateral intradural vertebral arteries are patent. Mild-to-moderate mid basilar artery stenosis. Bilateral PCAs are patent. Mild multifocal stenosis of bilateral PCAs. No aneurysm identified. Venous sinuses: Within limitation non arterial timing, no evidence of dural venous sinus thrombosis. IMPRESSION: CTA head: 1. No large vessel occlusion. 2. 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 3. Mild to moderate stenosis of the mid basilar artery and mild bilateral PCA stenosis. CTA Neck: 1. Approximately 70% stenosis of the proximal left ICA. 2. Approximately 50-60% stenosis of the right ICA origin 3. 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 This addendum was discussed with Dr. KLorrin Goodellvia telephone at 7:40 p.m. Electronically Signed   By: FMargaretha SheffieldMD  On: 11/07/2020 19:40   Result Date: 11/07/2020 CLINICAL DATA:  Stroke/TIA. EXAM: CT HEAD WITHOUT CONTRAST CT ANGIOGRAPHY OF THE HEAD AND NECK TECHNIQUE: Contiguous axial images were obtained from the base of the skull through the vertex without intravenous contrast. Multidetector CT imaging of the head and neck was performed using the standard protocol during  bolus administration of intravenous contrast. Multiplanar CT image reconstructions and MIPs were obtained to evaluate the vascular anatomy. Carotid stenosis measurements (when applicable) are obtained utilizing NASCET criteria, using the distal internal carotid diameter as the denominator. CONTRAST:  60m OMNIPAQUE IOHEXOL 350 MG/ML SOLN COMPARISON:  Same day CT head. FINDINGS: CTA NECK Aortic arch: Great vessel origins are patent. Right carotid system: Calcific and noncalcific atherosclerosis at the carotid bifurcation with 50-60% stenosis of the ICA origin. Left carotid system: Calcific and noncalcific atherosclerosis involving the carotid bifurcation and proximal ICA with approximately 70%. Vertebral arteries:Mildly right dominant. There is moderate stenosis of the right vertebral artery at the C6-C7 level due to mass effect from an adjacent osteophyte Skeleton: Moderate to severe multilevel degenerative change. Other neck: No acute abnormality.  Visualized lung apices are clear. CTA HEAD Anterior circulation: Extensive calcific atherosclerosis of bilateral cavernous and paraclinoid ICAs with severe right and moderate left cavernous and paraclinoid ICA stenosis. Bilateral MCAs are patent. Bilateral ACAs are patent. No aneurysm identified. Posterior circulation: Bilateral intradural vertebral arteries are patent. Mild-to-moderate mid basilar artery stenosis. Bilateral PCAs are patent. Mild multifocal stenosis of bilateral PCAs. No aneurysm identified. Venous sinuses: Within limitation non arterial timing, no evidence of dural venous sinus thrombosis. IMPRESSION: CTA head: 1. No large vessel occlusion. 2. 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 3. Mild to moderate stenosis of the mid basilar artery and mild bilateral PCA stenosis. CTA Neck: 1. Approximately 70% stenosis of the proximal left ICA. 2. Approximately 50-60% stenosis of the right ICA origin 3. Moderate stenosis of the right  vertebral artery at the C6-C7 level due to mass effect from an adjacent osteophyte Electronically Signed: By: FMargaretha SheffieldMD On: 11/07/2020 18:09      PHYSICAL EXAM  Temp:  [98.1 F (36.7 C)-98.8 F (37.1 C)] 98.7 F (37.1 C) (07/10 0835) Pulse Rate:  [71-91] 76 (07/10 1100) Resp:  [15-27] 26 (07/10 1100) BP: (144-189)/(60-155) 170/73 (07/10 1100) SpO2:  [93 %-100 %] 97 % (07/10 1100) Weight:  [72.9 kg-73.9 kg] 72.9 kg (07/09 1900)  General - Well nourished, well developed, in no apparent distress.  Ophthalmologic - fundi not visualized due to noncooperation.  Cardiovascular - Regular rhythm and rate.  Mental Status -  Level of arousal and orientation to time, place, and person were intact. Language including expression, naming, repetition, comprehension was assessed and found intact. Fund of Knowledge was assessed and was intact.  Cranial Nerves II - XII - II - Visual field intact OU. III, IV, VI - Extraocular movements intact. V - Facial sensation intact bilaterally. VII - mild left facial droop. VIII - Hearing & vestibular intact bilaterally. X - Palate elevates symmetrically. XI - Chin turning & shoulder shrug intact bilaterally. XII - Tongue protrusion intact.  Motor Strength - The patient's strength was normal in all extremities and pronator drift was absent.  Bulk was normal and fasciculations were absent.   Motor Tone - Muscle tone was assessed at the neck and appendages and was normal.  Reflexes - The patient's reflexes were symmetrical in all extremities and she had no pathological reflexes.  Sensory - Light  touch, temperature/pinprick were assessed and were symmetrical.    Coordination - The patient had normal movements in the hands and feet with no ataxia or dysmetria.  Tremor was absent.  Gait and Station - deferred.   ASSESSMENT/PLAN Ms. Alyssa GONTHIER is a 65 y.o. female with history of HTN, HLD, DM, CAD/NSTEMI s/p stent, CKD, CHF admitted for  left facial droop, slurry speech and right gaze preference. tPA was given.    Stroke:  right brain infarct, work up underway Resultant left facial droop CT no acute finding but old right thalamic and BG lacunes CTA head and neck b/l siphon severe stenosis, left ICA bulb 70% and right ICA bulb 50-60% stenosis MRI  pending 2D Echo  pending LDL 156 HgbA1c 9.2 UDS + for THC SCDs for VTE prophylaxis aspirin 81 mg daily and Brilinta (ticagrelor) 90 mg bid prior to admission, now on No antithrombotic post tPA. 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 This patient's care requires constant monitoring of vital signs, hemodynamics, respiratory and cardiac monitoring, review of multiple databases, neurological assessment, discussion with family, other specialists and medical decision making of high complexity. I spent 40 minutes of neurocritical care time in the care of this patient. I had long discussion with pt and daughter at bedside, updated pt current condition, treatment plan and potential prognosis, and answered  all the questions. They expressed understanding and appreciation.    Rosalin Hawking, MD PhD Stroke Neurology 11/08/2020 11:38 AM    To contact Stroke Continuity provider, please refer to [email or web address] After hours, contact General Neurology

## 2020-11-08 NOTE — Progress Notes (Signed)
  Echocardiogram 2D Echocardiogram has been performed.  [name] 11/08/2020, 4:53 PM

## 2020-11-08 NOTE — Evaluation (Signed)
Physical Therapy Evaluation Patient Details Name: [name] MRN: [id number] DOB: [birth year] Today's Date: 11/08/2020   History of Present Illness  Pt is a 65 y.o. female who presented 7/9 with R gaze preference, slurred speech, and L-sided weakness. tPA was administered. 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 CTA head and neck b/l siphon severe stenosis, left ICA bulb 70% and right ICA bulb 50-60% stenosis. Pending MRI. PMH: HTN, HLD, DM2, CAD/NSTEMI s/p stent, L hip arthritis, COPD, MI, PVD, CKD, CHF.   Clinical Impression  Pt presents with condition above and deficits mentioned below, see PT Problem List. PTA, she was mod I with use of her [facility], living with her husband in a ground-level apartment with 3 STE without rails. She plans to possibly d/c home with her son in another state though, unsure of his home set-up. Currently, pt displays deficits in memory, processing speed, balance, activity tolerance, and L-sided strength and coordination that place her at risk for falls. Pt requiring up to minA for standing mobility with a RW, favoring her R side. Will continue to follow acutely. Recommend follow-up with Outpatient PT to maximize her safety and independence with all functional mobility, preferably and neuro specialty clinic.    Follow Up Recommendations Outpatient PT;Supervision for mobility/OOB (neuro specialty clinic if able)    Equipment Recommendations  None recommended by PT    Recommendations for Other Services       Precautions / Restrictions Precautions Precautions: Fall Restrictions Weight Bearing Restrictions: No      Mobility  Bed Mobility Overal bed mobility: Needs Assistance Bed Mobility: Supine to Sit;Sit to Supine     Supine to sit: Supervision Sit to supine: Supervision   General bed mobility comments: Bed flat, no use of rails, pt able to transition supine <> sit with supervision for safety, extra time.    Transfers Overall  transfer level: Needs assistance Equipment used: Rolling walker (2 wheeled) Transfers: Sit to/from Stand Sit to Stand: Min assist         General transfer comment: Cued pt to push up from bed but pt continued to push up on RW to stand, minA to steady.  Ambulation/Gait Ambulation/Gait assistance: Min guard;Min assist Gait Distance (Feet): 80 Feet Assistive device: Rolling walker (2 wheeled) Gait Pattern/deviations: Step-through pattern;Decreased step length - left;Decreased stride length;Antalgic;Drifts right/left;Trunk flexed Gait velocity: reduced Gait velocity interpretation: <1.8 ft/sec, indicate of risk for recurrent falls General Gait Details: Pt with slight trunk flexion, reporting R hip pain and thus displaying some antalgic gait pattern. Pt with decreased L step length and tendency to drift to R side within RW. Min guard-minA to steady, cuing pt to remain proximal to RW.  Stairs            Wheelchair Mobility    Modified Rankin (Stroke Patients Only) Modified Rankin (Stroke Patients Only) Pre-Morbid Rankin Score: No significant disability Modified Rankin: Moderately severe disability     Balance Overall balance assessment: Needs assistance Sitting-balance support: No upper extremity supported;Feet supported Sitting balance-Leahy Scale: Good Sitting balance - Comments: Able to reach mod off BOS to try to donn socks with supervision.   Standing balance support: Bilateral upper extremity supported;During functional activity Standing balance-Leahy Scale: Poor Standing balance comment: Reliant on UE support with mobility.                             Pertinent Vitals/Pain Pain Assessment: Faces Faces Pain  Scale: Hurts a little bit Pain Location: hips Pain Descriptors / Indicators: Discomfort;Guarding Pain Intervention(s): Limited activity within patient's tolerance;Monitored during session;Repositioned    Home Living Family/patient expects to be  discharged to:: Private residence Living Arrangements: Spouse/significant other Available Help at Discharge: Family;Available 24 hours/day Type of Home: Apartment Home Access: Stairs to enter Entrance Stairs-Rails: None (can go around in grass) Entrance Stairs-Number of Steps: 3 (can go around in grass) Home Layout: One level Home Equipment: Shower seat;Grab bars - tub/shower;Bedside commode;Cane - single point;Walker - 2 wheels;Wheelchair - [employer] - 4 wheels Additional Comments: May d/c home with her son in another state, unsure of house set-up there. Husband is sick with CHF and unable to physically care for her.    Prior Function Level of Independence: Independent with assistive device(s)         Comments: uses SPC     Hand Dominance   Dominant Hand: Left    Extremity/Trunk Assessment   Upper Extremity Assessment Upper Extremity Assessment: LUE deficits/detail LUE Deficits / Details: Gross weakness compared to R with gross MMT scores of 4 to 4+; incoordination with dysdiadochokinesia and dysmetria noted LUE Sensation:  (denies numbness/tingling) LUE Coordination: decreased fine motor;decreased gross motor    Lower Extremity Assessment Lower Extremity Assessment: LLE deficits/detail (hx of R plantar foot tingling and bruising/redness medially; limited bil hip and knee flexion ROM) LLE Deficits / Details: Gross weakness compared to R with gross MMT scores of 4 to 4+; incoordination with dysdiadochokinesia and dysmetria noted LLE Sensation:  (denies numbness/tingling) LLE Coordination: decreased fine motor;decreased gross motor    Cervical / Trunk Assessment Cervical / Trunk Assessment: Normal  Communication   Communication: No difficulties  Cognition Arousal/Alertness: Awake/alert Behavior During Therapy: WFL for tasks assessed/performed Overall Cognitive Status: Impaired/Different from baseline Area of Impairment: Memory;Problem solving                      Memory: Decreased short-term memory       Problem Solving: Slow processing General Comments: Daughter reporting pt is forgetting things intermittently then able to recall a moment later. Slower to process info compared to norm, per daughter. A&Ox4.      General Comments General comments (skin integrity, edema, etc.): VSS on RA    Exercises     Assessment/Plan    PT Assessment Patient needs continued PT services  PT Problem List Decreased strength;Decreased range of motion;Decreased balance;Decreased activity tolerance;Decreased mobility;Decreased coordination;Decreased cognition;Decreased knowledge of use of DME;Decreased safety awareness;Impaired sensation       PT Treatment Interventions DME instruction;Gait training;Stair training;Functional mobility training;Therapeutic activities;Therapeutic exercise;Balance training;Neuromuscular re-education;Cognitive remediation;Patient/family education    PT Goals (Current goals can be found in the Care Plan section)  Acute Rehab PT Goals Patient Stated Goal: to go home with her son PT Goal Formulation: With patient/family Time For Goal Achievement: 11/22/20 Potential to Achieve Goals: Good    Frequency Min 4X/week   Barriers to discharge        Co-evaluation               AM-PAC PT "6 Clicks" Mobility  Outcome Measure Help needed turning from your back to your side while in a flat bed without using bedrails?: A Little Help needed moving from lying on your back to sitting on the side of a flat bed without using bedrails?: A Little Help needed moving to and from a bed to a chair (including a wheelchair)?: A Little Help needed standing up from a  chair using your arms (e.g., wheelchair or bedside chair)?: A Little Help needed to walk in hospital room?: A Little Help needed climbing 3-5 steps with a railing? : A Little 6 Click Score: 18    End of Session Equipment Utilized During Treatment: Gait belt Activity  Tolerance: Patient tolerated treatment well Patient left: in bed;with family/visitor present (with transport taking pt to MRI) Nurse Communication: Mobility status PT Visit Diagnosis: Unsteadiness on feet (R26.81);Other abnormalities of gait and mobility (R26.89);Muscle weakness (generalized) (M62.81);Difficulty in walking, not elsewhere classified (R26.2);Other symptoms and signs involving the nervous system (R29.898)    Time: [id number] PT Time Calculation (min) (ACUTE ONLY): 34 min   Charges:   PT Evaluation $PT Eval Moderate Complexity: 1 Mod PT Treatments $Therapeutic Activity: 8-22 mins        Moishe Spice, PT, DPT Acute Rehabilitation Services  Pager: [phone] Office: [phone]   Orvan Falconer 11/08/2020, 3:26 PM

## 2020-11-08 NOTE — Progress Notes (Signed)
Echo attempted. Patient having MRI per nurse. Will attempt again later as schedule permits.

## 2020-11-08 NOTE — Progress Notes (Signed)
SLP Cancellation Note  Patient Details Name: [name] MRN: [id number] DOB: 27-Feb-1956   Cancelled treatment:       Reason Eval/Treat Not Completed: Other (comment) (visiting with MD) MD present in room upon SLP arrival. Pt also to have personal care completed. SLP service will return as schedule allows.  [name], M.S., CCC-SLP Speech-Language Pathologist Acute Rehabilitation Services Pager: [phone] 11/08/2020, 12:33 PM

## 2020-11-09 DIAGNOSIS — I63412 Cerebral infarction due to embolism of left middle cerebral artery: Secondary | ICD-10-CM | POA: Diagnosis not present

## 2020-11-09 DIAGNOSIS — I255 Ischemic cardiomyopathy: Secondary | ICD-10-CM | POA: Diagnosis not present

## 2020-11-09 DIAGNOSIS — I6523 Occlusion and stenosis of bilateral carotid arteries: Secondary | ICD-10-CM | POA: Diagnosis not present

## 2020-11-09 DIAGNOSIS — E7800 Pure hypercholesterolemia, unspecified: Secondary | ICD-10-CM | POA: Diagnosis not present

## 2020-11-09 DIAGNOSIS — E1165 Type 2 diabetes mellitus with hyperglycemia: Secondary | ICD-10-CM | POA: Diagnosis not present

## 2020-11-09 DIAGNOSIS — I5021 Acute systolic (congestive) heart failure: Secondary | ICD-10-CM | POA: Diagnosis not present

## 2020-11-09 DIAGNOSIS — I10 Essential (primary) hypertension: Secondary | ICD-10-CM | POA: Diagnosis not present

## 2020-11-09 DIAGNOSIS — N1832 Chronic kidney disease, stage 3b: Secondary | ICD-10-CM | POA: Diagnosis not present

## 2020-11-09 DIAGNOSIS — I639 Cerebral infarction, unspecified: Secondary | ICD-10-CM | POA: Diagnosis not present

## 2020-11-09 LAB — BASIC METABOLIC PANEL
Anion gap: 11 (ref 5–15)
BUN: 22 mg/dL (ref 8–23)
CO2: 20 mmol/L — ABNORMAL LOW (ref 22–32)
Calcium: 8.9 mg/dL (ref 8.9–10.3)
Chloride: 107 mmol/L (ref 98–111)
Creatinine, Ser: 1.75 mg/dL — ABNORMAL HIGH (ref 0.44–1.00)
GFR, Estimated: 32 mL/min — ABNORMAL LOW (ref >60)
Glucose, Bld: 193 mg/dL — ABNORMAL HIGH (ref 70–99)
Potassium: 3.7 mmol/L (ref 3.5–5.1)
Sodium: 138 mmol/L (ref 135–145)

## 2020-11-09 LAB — CBC
HCT: 32.8 % — ABNORMAL LOW (ref 36.0–46.0)
Hemoglobin: 10.6 g/dL — ABNORMAL LOW (ref 12.0–15.0)
MCH: 29 pg (ref 26.0–34.0)
MCHC: 32.3 g/dL (ref 30.0–36.0)
MCV: 89.6 fL (ref 80.0–100.0)
Platelets: 208 10*3/uL (ref 150–400)
RBC: 3.66 MIL/uL — ABNORMAL LOW (ref 3.87–5.11)
RDW: 15.5 % (ref 11.5–15.5)
WBC: 6.6 10*3/uL (ref 4.0–10.5)
nRBC: 0 % (ref 0.0–0.2)

## 2020-11-09 LAB — GLUCOSE, CAPILLARY
Glucose-Capillary: 329 mg/dL — ABNORMAL HIGH (ref 70–99)
Glucose-Capillary: 217 mg/dL — ABNORMAL HIGH (ref 70–99)
Glucose-Capillary: 143 mg/dL — ABNORMAL HIGH (ref 70–99)
Glucose-Capillary: 225 mg/dL — ABNORMAL HIGH (ref 70–99)
Glucose-Capillary: 240 mg/dL — ABNORMAL HIGH (ref 70–99)

## 2020-11-09 MED ORDER — SPIRONOLACTONE 12.5 MG HALF TABLET
12.5000 mg | ORAL_TABLET | Freq: Every day | ORAL | Status: DC
  Administered 2020-11-09 – 2020-11-11 (×3): 12.5 mg via ORAL
  Filled 2020-11-09 (×3): qty 1

## 2020-11-09 MED ORDER — CARVEDILOL 12.5 MG PO TABS
12.5000 mg | ORAL_TABLET | Freq: Two times a day (BID) | ORAL | Status: DC
  Administered 2020-11-09 – 2020-11-11 (×5): 12.5 mg via ORAL
  Filled 2020-11-09 (×5): qty 1

## 2020-11-09 MED ORDER — MELATONIN 5 MG PO TABS
5.0000 mg | ORAL_TABLET | Freq: Every day | ORAL | Status: DC
  Administered 2020-11-09: 5 mg via ORAL
  Filled 2020-11-09: qty 1

## 2020-11-09 MED ORDER — MELATONIN 5 MG PO TABS
10.0000 mg | ORAL_TABLET | Freq: Every day | ORAL | Status: DC
  Administered 2020-11-09 – 2020-11-10 (×2): 10 mg via ORAL
  Filled 2020-11-09 (×2): qty 2

## 2020-11-09 MED ORDER — ISOSORBIDE MONONITRATE ER 60 MG PO TB24
120.0000 mg | ORAL_TABLET | Freq: Every day | ORAL | Status: DC
  Administered 2020-11-09 – 2020-11-11 (×3): 120 mg via ORAL
  Filled 2020-11-09: qty 2
  Filled 2020-11-09 (×2): qty 4

## 2020-11-09 MED ORDER — SACUBITRIL-VALSARTAN 49-51 MG PO TABS
1.0000 | ORAL_TABLET | Freq: Two times a day (BID) | ORAL | Status: DC
  Administered 2020-11-09 – 2020-11-11 (×5): 1 via ORAL
  Filled 2020-11-09 (×6): qty 1

## 2020-11-09 NOTE — Progress Notes (Addendum)
STROKE TEAM PROGRESS NOTE   SUBJECTIVE (INTERVAL HISTORY) Her RN is at the bedside. She is sitting up to chair feeding herself breakfast. Will transfer to neuro floor.  Stroke work up reviewed with pt and explained her carotid disease and vascular consulted.   OBJECTIVE Temp:  [98.4 F (36.9 C)-99.6 F (37.6 C)] 98.4 F (36.9 C) (07/11 1200) Pulse Rate:  [71-96] 77 (07/11 1300) Cardiac Rhythm: Normal sinus rhythm (07/11 0800) Resp:  [11-42] 18 (07/11 1300) BP: (125-187)/(50-94) 144/82 (07/11 1300) SpO2:  [90 %-99 %] 97 % (07/11 1300)  Recent Labs  Lab 11/08/20 1208 11/08/20 1625 11/08/20 2152 11/09/20 0734 11/09/20 1155  GLUCAP 143* 291* 234* 217* 143*    Recent Labs  Lab 11/07/20 1641 11/09/20 0327  NA 136 138  K 4.7 3.7  CL 105 107  CO2 16* 20*  GLUCOSE 501* 193*  BUN 26* 22  CREATININE 2.18* 1.75*  CALCIUM 8.8* 8.9    Recent Labs  Lab 11/07/20 1641  AST 25  ALT 15  ALKPHOS 90  BILITOT 0.5  PROT 6.3*  ALBUMIN 2.8*    Recent Labs  Lab 11/07/20 1641 11/09/20 0327  WBC 5.3 6.6  NEUTROABS 3.4  --   HGB 12.3 10.6*  HCT 41.0 32.8*  MCV 96.5 89.6  PLT 160 208    No results for input(s): CKTOTAL, CKMB, CKMBINDEX, TROPONINI in the last 168 hours. Recent Labs    11/07/20 1925  LABPROT 15.7*  INR 1.3*    No results for input(s): COLORURINE, LABSPEC, PHURINE, GLUCOSEU, HGBUR, BILIRUBINUR, KETONESUR, PROTEINUR, UROBILINOGEN, NITRITE, LEUKOCYTESUR in the last 72 hours.  Invalid input(s): APPERANCEUR     Component Value Date/Time   CHOL 222 (H) 11/07/2020 2350   CHOL 149 08/31/2017 0941   TRIG 75 11/07/2020 2350   HDL 51 11/07/2020 2350   HDL 60 08/31/2017 0941   CHOLHDL 4.4 11/07/2020 2350   VLDL 15 11/07/2020 2350   LDLCALC 156 (H) 11/07/2020 2350   LDLCALC 74 08/31/2017 0941   Lab Results  Component Value Date   HGBA1C 9.2 (H) 11/07/2020      Component Value Date/Time   LABOPIA NONE DETECTED 11/08/2020 0840   COCAINSCRNUR NONE  DETECTED 11/08/2020 0840   LABBENZ NONE DETECTED 11/08/2020 0840   AMPHETMU NONE DETECTED 11/08/2020 0840   THCU POSITIVE (A) 11/08/2020 0840   LABBARB NONE DETECTED 11/08/2020 0840    No results for input(s): ETH in the last 168 hours.  I have personally reviewed the radiological images below and agree with the radiology interpretations.  MR BRAIN WO CONTRAST  Result Date: 11/08/2020 CLINICAL DATA:  Stroke, follow-up status post tPA EXAM: MRI HEAD WITHOUT CONTRAST TECHNIQUE: Multiplanar, multiecho pulse sequences of the brain and surrounding structures were obtained without intravenous contrast. COMPARISON:  CT exams from yesterday. FINDINGS: Brain: Acute infarct in the right frontal lobe, operculum, and anterior insula.Associated edema without significant mass effect. No evidence of acute mass occupying hemorrhagic transformation. There are numerous small chronic microhemorrhages in bilateral basal ganglia and thalami and the brainstem, as well as a few in the supratentorial white matter. Remote lacunar infarcts in the left corona radiata, right basal ganglia, right thalamus, midbrain and pons. No mass lesion or extra-axial fluid collection. No midline shift. Basal cisterns are patent. Vascular: Major arterial flow voids are maintained at the skull base. Skull and upper cervical spine: Normal marrow signal. Sinuses/Orbits: Moderate ethmoid, maxillary, and right sphenoid sinus mucosal thickening. Other: No sizable mastoid effusions. IMPRESSION: 1. Acute infarct  in the right frontal lobe, operculum, and anterior insula. Associated edema without significant mass effect. Remote lacunar infarcts in the left corona radiata, right basal ganglia, right thalamus, midbrain and pons. 2. Numerous prior microhemorrhages that are predominantly in the basal ganglia/thalami and brainstem, most likely secondary to hypertension. 3. Ventriculomegaly, which is mildly out of proportion to the degree of sulcal volume loss.  Additionally, the callosal angle is somewhat acute and there is crowding of sulci at the vertex. Findings could be secondary to normal pressure hydrocephalus or central predominant atrophy with ex vacuo ventricular dilation. 4. Moderate paranasal sinus mucosal thickening. Electronically Signed   By: Margaretha Sheffield MD   On: 11/08/2020 15:30   ECHOCARDIOGRAM COMPLETE  Result Date: 11/08/2020    ECHOCARDIOGRAM REPORT   Patient Name:   CLAUDELLE SCHOLLE Date of Exam: 11/08/2020 Medical Rec #:  [id number]      Height:       63.0 in Accession #:    [id number]     Weight:       160.7 lb Date of Birth:  1955-11-05     BSA:          1.762 m Patient Age:    56 years       BP:           149/82 mmHg Patient Gender: F              HR:           79 bpm. Exam Location:  Inpatient Procedure: 2D Echo, 3D Echo, Cardiac Doppler, Color Doppler and Intracardiac            Opacification Agent Indications:    Stroke  History:        Patient has prior history of Echocardiogram examinations, most                 recent 06/06/2020. CHF, CAD and Previous Myocardial Infarction,                 COPD; Risk Factors:Hypertension, Dyslipidemia and Diabetes.  Sonographer:    Clayton Lefort RDCS (AE) Referring Phys: [employer] [facility]  1. Left ventricular ejection fraction, by estimation, is 45 to 50%. The left ventricle has mildly decreased function. The left ventricle demonstrates global hypokinesis. There is moderate concentric left ventricular hypertrophy. Left ventricular diastolic parameters are consistent with Grade II diastolic dysfunction (pseudonormalization). Elevated left atrial pressure. There is very mild hypokinesis of the left ventricular, basal-mid inferior wall and inferoseptal wall. There is profound LBBB-related septal-lateral wall dyssynchrony that accounts for most of the decrease in LVEF.  2. Right ventricular systolic function is normal. The right ventricular size is normal. There is mildly elevated pulmonary  artery systolic pressure.  3. Left atrial size was mild to moderately dilated.  4. The mitral valve is degenerative. Mild mitral valve regurgitation. No evidence of mitral stenosis. Moderate to severe mitral annular calcification.  5. The aortic valve is tricuspid. There is moderate calcification of the aortic valve. There is moderate thickening of the aortic valve. Aortic valve regurgitation is mild. Mild to moderate aortic valve sclerosis/calcification is present, without any evidence of aortic stenosis.  6. The inferior vena cava is normal in size with greater than 50% respiratory variability, suggesting right atrial pressure of 3 mmHg. Comparison(s): Prior images reviewed side by side. The left ventricular function has improved. The left ventricular wall motion has improved. FINDINGS  Left Ventricle: There is profound LBBB-related septal-lateral wall dyssynchrony  that accounts for most of the decrease in LVEF. Left ventricular ejection fraction, by estimation, is 45 to 50%. The left ventricle has mildly decreased function. The left ventricle demonstrates global hypokinesis. Mild hypokinesis of the left ventricular, basal-mid inferior wall and inferoseptal wall. Definity contrast agent was given IV to delineate the left ventricular endocardial borders. 3D left ventricular ejection fraction analysis performed but not reported based on interpreter judgement due to suboptimal quality. The left ventricular internal cavity size was normal in size. There is moderate concentric left ventricular hypertrophy. Abnormal (paradoxical) septal motion, consistent with left bundle branch block. Left ventricular diastolic parameters are consistent with Grade II diastolic dysfunction (pseudonormalization). Elevated left atrial pressure. Right Ventricle: The right ventricular size is normal. No increase in right ventricular wall thickness. Right ventricular systolic function is normal. There is mildly elevated pulmonary artery  systolic pressure. The tricuspid regurgitant velocity is 2.76  m/s, and with an assumed right atrial pressure of 3 mmHg, the estimated right ventricular systolic pressure is [id number] mmHg. Left Atrium: Left atrial size was mild to moderately dilated. Right Atrium: Right atrial size was normal in size. Pericardium: There is no evidence of pericardial effusion. Mitral Valve: The mitral valve is degenerative in appearance. Moderate to severe mitral annular calcification. Mild mitral valve regurgitation. No evidence of mitral valve stenosis. MV peak gradient, 5.4 mmHg. The mean mitral valve gradient is 3.0 mmHg. Tricuspid Valve: The tricuspid valve is normal in structure. Tricuspid valve regurgitation is not demonstrated. Aortic Valve: There is a broad-based fixed calcified echodensity on the tip of the noncoronary cusp 4x7 mm in size. It probably represents degenerative change, but may be a healed vegetation. The aortic valve is tricuspid. There is moderate calcification  of the aortic valve. There is moderate thickening of the aortic valve. Aortic valve regurgitation is mild. Aortic regurgitation PHT measures 373 msec. Mild to moderate aortic valve sclerosis/calcification is present, without any evidence of aortic stenosis. Aortic valve mean gradient measures 4.0 mmHg. Aortic valve peak gradient measures 8.3 mmHg. Aortic valve area, by VTI measures 1.79 cm. Pulmonic Valve: The pulmonic valve was normal in structure. Pulmonic valve regurgitation is not visualized. Aorta: The aortic root and ascending aorta are structurally normal, with no evidence of dilitation. Venous: The inferior vena cava is normal in size with greater than 50% respiratory variability, suggesting right atrial pressure of 3 mmHg. IAS/Shunts: No atrial level shunt detected by color flow Doppler.  LEFT VENTRICLE PLAX 2D LVIDd:         4.20 cm      Diastology LVIDs:         3.60 cm      LV e' medial:    3.45 cm/s LV PW:         1.50 cm      LV E/e'  medial:  34.2 LV IVS:        1.70 cm      LV e' lateral:   6.83 cm/s LVOT diam:     1.90 cm      LV E/e' lateral: 17.3 LV SV:         52 LV SV Index:   30 LVOT Area:     2.84 cm                              3D Volume EF: LV Volumes (MOD)            3D EF:  36 % LV vol d, MOD A2C: 98.1 ml  LV EDV:       125 ml LV vol d, MOD A4C: 110.0 ml LV ESV:       80 ml LV vol s, MOD A2C: 54.0 ml  LV SV:        45 ml LV vol s, MOD A4C: 69.0 ml LV SV MOD A2C:     44.1 ml LV SV MOD A4C:     110.0 ml LV SV MOD BP:      46.5 ml RIGHT VENTRICLE             IVC RV Basal diam:  2.80 cm     IVC diam: 1.80 cm RV S prime:     10.40 cm/s TAPSE (M-mode): 2.0 cm LEFT ATRIUM           Index       RIGHT ATRIUM           Index LA diam:      4.10 cm 2.33 cm/m  RA Area:     14.00 cm LA Vol (A4C): 65.0 ml 36.89 ml/m RA Volume:   32.20 ml  18.27 ml/m  AORTIC VALVE AV Area (Vmax):    1.89 cm AV Area (Vmean):   1.56 cm AV Area (VTI):     1.79 cm AV Vmax:           144.00 cm/s AV Vmean:          97.500 cm/s AV VTI:            0.291 m AV Peak Grad:      8.3 mmHg AV Mean Grad:      4.0 mmHg LVOT Vmax:         95.90 cm/s LVOT Vmean:        53.500 cm/s LVOT VTI:          0.184 m LVOT/AV VTI ratio: 0.63 AI PHT:            373 msec  AORTA Ao Root diam: 2.70 cm Ao Asc diam:  3.30 cm MITRAL VALVE                TRICUSPID VALVE MV Area (PHT): 5.27 cm     TR Peak grad:   30.5 mmHg MV Peak grad:  5.4 mmHg     TR Vmax:        276.00 cm/s MV Mean grad:  3.0 mmHg MV Vmax:       1.16 m/s     SHUNTS MV Vmean:      87.7 cm/s    Systemic VTI:  0.18 m MV Decel Time: 144 msec     Systemic Diam: 1.90 cm MV E velocity: 118.00 cm/s MV A velocity: 103.00 cm/s MV E/A ratio:  1.15 Mihai Croitoru MD Electronically signed by Sanda Klein MD Signature Date/Time: 11/08/2020/5:09:44 PM    Final    CT HEAD CODE STROKE WO CONTRAST  Result Date: 11/07/2020 CLINICAL DATA:  Code stroke.  Neuro deficit, acute stroke suspected. EXAM: CT HEAD WITHOUT CONTRAST TECHNIQUE:  Contiguous axial images were obtained from the base of the skull through the vertex without intravenous contrast. COMPARISON:  October 03, 2007. FINDINGS: Brain: Remote lacunar infarct in the right thalamus and right basal ganglia, present on the prior CT head. No evidence of acute large vascular territory infarct or acute hemorrhage. Progressive patchy white matter hypoattenuation, most likely related to chronic microvascular ischemic disease. Progressive ventriculomegaly, which is  mildly out of proportion to the degree of sulcal volume loss. Additionally, the callosum angle is somewhat acute and there is crowding of sulci at the vertex Vascular: No hyperdense vessel identified. Calcific atherosclerosis. Skull: No acute fracture. Sinuses/Orbits: Moderate mucosal thickening. No acute orbital findings. Other: No mastoid effusions. ASPECTS [facility] Stroke Program Early CT Score) total score (0-10 with 10 being normal): 10. IMPRESSION: 1. No evidence of acute large vascular territory infarct or acute hemorrhage. ASPECTS is 10 2. Progressive white matter hypoattenuation, most likely related to chronic microvascular ischemic disease. An MRI could provide more sensitive evaluation for acute white matter infarct if clinically indicated. 3. Remote infarcts in the right thalamus, right basal ganglia. 4. Progressive ventriculomegaly, which is mildly out of proportion to the degree of sulcal volume loss. Additionally, the callosal angle is somewhat acute and there is crowding of sulci at the vertex. Consider normal pressure hydrocephalus. Central predominant atrophy with ex vacuo ventricular dilation is a differential consideration. Findings discussed with Dr. Leonel Ramsay at 5:06 p.m. 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 This addendum was discussed with Dr. Lorrin Goodell via telephone at 7:40 p.m. Electronically Signed   By: Margaretha Sheffield MD   On: 11/07/2020 19:40   Result Date: 11/07/2020 CLINICAL DATA:  Stroke/TIA. EXAM: CT HEAD WITHOUT CONTRAST CT ANGIOGRAPHY OF THE HEAD AND NECK TECHNIQUE: Contiguous axial images were obtained from the base of the skull through the vertex without intravenous contrast. Multidetector CT imaging of the head and neck was performed using the standard protocol during bolus administration of intravenous contrast. Multiplanar CT image reconstructions and MIPs were obtained to evaluate the vascular anatomy. Carotid stenosis measurements (when applicable) are obtained utilizing NASCET criteria, using the distal internal carotid diameter as the denominator. CONTRAST:  60m OMNIPAQUE IOHEXOL 350 MG/ML SOLN COMPARISON:  Same day CT head. FINDINGS: CTA NECK Aortic arch: Great vessel origins are patent. Right carotid system: Calcific and noncalcific atherosclerosis at the carotid bifurcation with 50-60% stenosis of the ICA origin. Left carotid system: Calcific and noncalcific atherosclerosis involving the carotid bifurcation and proximal ICA with approximately 70%. Vertebral arteries:Mildly right dominant. There is moderate stenosis of the right vertebral artery at the C6-C7 level due to mass effect from an adjacent osteophyte Skeleton: Moderate to severe multilevel degenerative change. Other neck: No acute abnormality.  Visualized lung apices are clear. CTA HEAD Anterior circulation: Extensive calcific atherosclerosis of bilateral cavernous and paraclinoid ICAs with severe right and moderate left cavernous and paraclinoid ICA stenosis. Bilateral MCAs are patent. Bilateral ACAs are patent. No aneurysm identified. Posterior circulation: Bilateral intradural vertebral arteries are patent. Mild-to-moderate mid basilar artery stenosis. Bilateral PCAs are  patent. Mild multifocal stenosis of bilateral PCAs. No aneurysm identified. Venous sinuses: Within limitation non arterial timing, no evidence of dural venous sinus thrombosis. IMPRESSION: CTA head: 1. No large vessel occlusion. 2. 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 3. Mild to moderate stenosis of the mid basilar artery and mild bilateral PCA stenosis. CTA Neck: 1. Approximately 70% stenosis of the proximal left ICA. 2. Approximately 50-60% stenosis of the right ICA origin 3. 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 This addendum was discussed with Dr. Lorrin Goodell via telephone at 7:40 p.m. Electronically Signed   By: Margaretha Sheffield MD   On: 11/07/2020 19:40   Result Date: 11/07/2020 CLINICAL DATA:  Stroke/TIA. EXAM: CT HEAD WITHOUT CONTRAST CT ANGIOGRAPHY OF THE HEAD AND NECK TECHNIQUE: Contiguous axial images were obtained from the base of the skull through the vertex without intravenous contrast. Multidetector CT imaging of the head and neck was performed using the standard protocol during bolus administration of intravenous contrast. Multiplanar CT image reconstructions and MIPs were obtained to evaluate the vascular anatomy. Carotid stenosis measurements (when applicable) are obtained utilizing NASCET criteria, using the distal internal carotid diameter as the denominator. CONTRAST:  65m OMNIPAQUE IOHEXOL 350 MG/ML SOLN COMPARISON:  Same day CT head. FINDINGS: CTA NECK Aortic arch: Great vessel origins are patent. Right carotid system: Calcific and noncalcific atherosclerosis at the carotid  bifurcation with 50-60% stenosis of the ICA origin. Left carotid system: Calcific and noncalcific atherosclerosis involving the carotid bifurcation and proximal ICA with approximately 70%. Vertebral arteries:Mildly right dominant. There is moderate stenosis of the right vertebral artery at the C6-C7 level due to mass effect from an adjacent osteophyte Skeleton: Moderate to severe multilevel degenerative change. Other neck: No acute abnormality.  Visualized lung apices are clear. CTA HEAD Anterior circulation: Extensive calcific atherosclerosis of bilateral cavernous and paraclinoid ICAs with severe right and moderate left cavernous and paraclinoid ICA stenosis. Bilateral MCAs are patent. Bilateral ACAs are patent. No aneurysm identified. Posterior circulation: Bilateral intradural vertebral arteries are patent. Mild-to-moderate mid basilar artery stenosis. Bilateral PCAs are patent. Mild multifocal stenosis of bilateral PCAs. No aneurysm identified. Venous sinuses: Within limitation non arterial timing, no evidence of dural venous sinus thrombosis. IMPRESSION: CTA head: 1. No large vessel occlusion. 2. 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 3. Mild to moderate stenosis of the mid basilar artery and mild bilateral PCA stenosis. CTA Neck: 1. Approximately 70% stenosis of the proximal left ICA. 2. Approximately 50-60% stenosis of the right ICA origin 3. Moderate stenosis of the right vertebral artery at the C6-C7 level due to mass effect from an adjacent osteophyte Electronically Signed: By: FMargaretha SheffieldMD On: 11/07/2020 18:09      PHYSICAL EXAM  Temp:  [98.4 F (36.9 C)-99.6 F (37.6 C)] 98.4 F (36.9 C) (07/11 1200) Pulse Rate:  [71-96] 77 (07/11 1300) Resp:  [11-42] 18 (07/11 1300) BP: (125-187)/(50-94) 144/82 (07/11 1300) SpO2:  [90 %-99 %] 97 % (07/11 1300)  General - Well nourished, well developed, in no apparent distress.  Ophthalmologic - fundi not  visualized due to noncooperation.  Cardiovascular - Regular rhythm and rate.  Mental Status -  A&Ox4. Language including expression, naming, repetition, comprehension was assessed and found intact. Fund of Knowledge was assessed and was intact.  Cranial Nerves II - XII - II - Visual field intact III, IV, VI - Extraocular movements intact. V - Facial sensation intact bilaterally. VII - mild left flattening of naso-labial fold. VIII - Hearing & vestibular intact bilaterally. X - Palate elevates symmetrically. XI - Chin turning & shoulder shrug intact bilaterally. XII - Tongue protrusion intact.  Motor Strength - The patient's strength was normal in  all extremities and pronator drift was absent.  Bulk was normal and fasciculations were absent.   Motor Tone - Muscle tone was assessed at the neck and appendages and was normal.  Reflexes - The patient's reflexes were symmetrical in all extremities and she had no pathological reflexes.  Sensory - Intact to light touch and symmetrical.    Coordination - The patient had normal movements in the hands and feet with no ataxia or dysmetria.  Tremor was absent.  Gait and Station - deferred.  ASSESSMENT/PLAN [name] is a 65 y.o. female with history of HTN, HLD, DM, CAD/NSTEMI s/p stent, CKD, CHF admitted for left facial droop, slurry speech and right gaze preference. tPA was given.    Stroke: Rt MCA, etiology could be d/t RICA stenosis vs. CHF. Vascular Sx was consulted.  Resultant left facial droop CT no acute finding but old right thalamic and BG lacunes CTA head and neck b/l siphon severe stenosis, left ICA bulb 70% and right ICA bulb 50-60% stenosis MRI Acute infarct in the right frontal lobe, operculum, and anterior insula. Associated edema without significant mass effect. Remote lacunar infarcts in the left corona radiata, right basal ganglia, right thalamus, midbrain and pons. 2. Numerous prior microhemorrhages that are  predominantly in the basal ganglia/thalami and brainstem, most likely secondary to hypertension. 3. Ventriculomegaly, which is mildly out of proportion to the degree of sulcal volume loss. Additionally, the callosal angle is somewhat acute and there is crowding of sulci at the vertex. Findings could be secondary to normal pressure hydrocephalus or central predominant atrophy with ex vacuo ventricular dilation. 2D Echo  45% EF, LV has global hypokinesis, mod LVH. LA dilation LDL 156 HgbA1c 9.2 UDS + for THC SCDs for VTE prophylaxis aspirin 81 mg daily and Brilinta (ticagrelor) 90 mg bid prior to admission, now restarted on aspirin and Brilinta.  Patient counseled to be compliant with her antithrombotic medications Ongoing aggressive stroke risk factor management Therapy recommendations: Outpatient PT/OT Disposition:  pending  Cerebrovascular stenosis CTA head and neck b/l siphon severe stenosis, left ICA bulb 70% and right ICA bulb 50-60% stenosis Vascular surgery consulted for right ICA stenosis  Diabetes HgbA1c 9.2 goal < 7.0 Uncontrolled CBG monitoring SSI DM education and close PCP follow up DM diet now  Hypertension Stable on the high end Resume home BP and heart failure medications including Coreg, Entresto, Imdur, spironolactone Long term BP goal normotensive  Hyperlipidemia Home meds:  none  LDL 156, goal < 70 Now on lipitor 80 Continue statin at discharge  Other Stroke Risk Factors Former smoker, quit 33 years ago Coronary artery disease / NSTEMI s/p stenting CHF 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 Pt was seen and examined.   No family at bedside.  Patient sitting in chair, no  acute event overnight, neuro stable, left facial droop improved from yesterday.  No other focal deficit.  BP on the higher end, resume home medication including Coreg, Imdur, Entresto, spironolactone.  Given CTA head and neck showed bilateral carotid stenosis, vascular surgery consulted.  Continue aspirin Brilinta and Lipitor.  PT OT recommend outpatient PT/OT.  For detailed assessment and plan, please refer to above as I have made changes wherever appropriate.   Nandan Willems  Erlinda Hong, MD PhD Stroke Neurology 11/09/2020 6:36 PM    To contact Stroke Continuity provider, please refer to [email or web address] After hours, contact General Neurology

## 2020-11-09 NOTE — Progress Notes (Signed)
Physical Therapy Treatment Patient Details Name: [name] MRN: [id number] DOB: 1955-07-09 Today's Date: 11/09/2020    History of Present Illness Pt is a 65 y.o. female who presented 7/9 with R gaze preference, slurred speech, and L-sided weakness. tPA was administered. 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 CTA head and neck b/l siphon severe stenosis, left ICA bulb 70% and right ICA bulb 50-60% stenosis. 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 PMH: HTN, HLD, DM2, CAD/NSTEMI s/p stent, L hip arthritis, COPD, MI, PVD, CKD, CHF.    PT Comments    Pt progressing well towards her physical therapy goals. Ambulating x 80 feet with a walker and negotiated several steps at a min guard assist level. Pt trialed walker vs cane with improved stability and gait speed noted with the walker and therefore recommended for initial mobility. Pt reporting right hip pain and resulting antalgic gait pattern which is baseline. D/c plan remains appropriate.      Follow Up Recommendations  Outpatient PT;Supervision for mobility/OOB     Equipment Recommendations  None recommended by PT    Recommendations for Other Services       Precautions / Restrictions Precautions Precautions: Fall Restrictions Weight Bearing Restrictions: No    Mobility  Bed Mobility               General bed mobility comments:  (OOB in chair)    Transfers Overall transfer level: Needs assistance Equipment used: Rolling walker (2 wheeled);Straight cane Transfers: Sit to/from Stand Sit to Stand: Supervision         General transfer comment: supervision for safety  Ambulation/Gait Ambulation/Gait assistance: Min guard Gait Distance (Feet): 80 Feet Assistive device: Rolling walker (2 wheeled);Straight cane Gait Pattern/deviations: Step-through pattern;Decreased step length - left;Decreased stride length;Antalgic;Drifts right/left;Trunk flexed Gait  velocity: decreased   General Gait Details: Initially utilized SPC, but pt reaching for additional external support. Improved stability with use of walker, min guard for balance. Max multimodal cues for environmenal navigation   Stairs Stairs: Yes Stairs assistance: Min guard Stair Management: One rail Right Number of Stairs: 4 General stair comments: cues for step by step; pt utilizing LLE to ascend, RLE to descend   Wheelchair Mobility    Modified Rankin (Stroke Patients Only) Modified Rankin (Stroke Patients Only) Pre-Morbid Rankin Score: No significant disability Modified Rankin: Moderately severe disability     Balance Overall balance assessment: Needs assistance Sitting-balance support: No upper extremity supported;Feet supported Sitting balance-Leahy Scale: Good Sitting balance - Comments: Able to reach mod off BOS to try to donn socks with supervision.   Standing balance support: Bilateral upper extremity supported;During functional activity Standing balance-Leahy Scale: Poor Standing balance comment: Reliant on UE support with mobility.                            Cognition Arousal/Alertness: Awake/alert Behavior During Therapy: WFL for tasks assessed/performed Overall Cognitive Status: Impaired/Different from baseline Area of Impairment: Memory;Problem solving;Attention;Safety/judgement;Awareness                   Current Attention Level: Selective Memory: Decreased short-term memory   Safety/Judgement: Decreased awareness of safety;Decreased awareness of deficits Awareness: Emergent Problem Solving: Slow processing General Comments: Daughter reporting pt is forgetting things intermittently then able to recall a moment later. Slower to process info compared to norm, per daughter. A&Ox4.      Exercises  General Comments General comments (skin integrity, edema, etc.): daughter reports Mom more forgetful since having Covid in Feb       Pertinent Vitals/Pain Pain Assessment: Faces Faces Pain Scale: Hurts a little bit Pain Location: R hip Pain Descriptors / Indicators: Discomfort;Guarding Pain Intervention(s): Monitored during session    [place] expects to be discharged to:: Private residence Living Arrangements: Spouse/significant other Available Help at Discharge: Family;Available 24 hours/day Type of Home: Apartment Home Access: Stairs to enter Entrance Stairs-Rails: None (can go around in grass) Home Layout: One level Home Equipment: Shower seat;Grab bars - tub/shower;Bedside commode;Cane - single point;Walker - 2 wheels;Wheelchair - [employer] - 4 wheels Additional Comments: May d/c home with her son in another state, unsure of house set-up there. Husband is sick with CHF and unable to physically care for her.    Prior Function Level of Independence: Independent with assistive device(s)      Comments: uses SPC   PT Goals (current goals can now be found in the care plan section) Acute Rehab PT Goals Patient Stated Goal: to go home with her daughter Potential to Achieve Goals: Good Progress towards PT goals: Progressing toward goals    Frequency    Min 4X/week      PT Plan Current plan remains appropriate    Co-evaluation              AM-PAC PT "6 Clicks" Mobility   Outcome Measure  Help needed turning from your back to your side while in a flat bed without using bedrails?: None Help needed moving from lying on your back to sitting on the side of a flat bed without using bedrails?: A Little Help needed moving to and from a bed to a chair (including a wheelchair)?: A Little Help needed standing up from a chair using your arms (e.g., wheelchair or bedside chair)?: A Little Help needed to walk in hospital room?: A Little Help needed climbing 3-5 steps with a railing? : A Little 6 Click Score: 19    End of Session Equipment Utilized During Treatment: Gait  belt Activity Tolerance: Patient tolerated treatment well Patient left: in chair;with call bell/phone within reach;with chair alarm set Nurse Communication: Mobility status PT Visit Diagnosis: Unsteadiness on feet (R26.81);Other abnormalities of gait and mobility (R26.89);Muscle weakness (generalized) (M62.81);Difficulty in walking, not elsewhere classified (R26.2);Other symptoms and signs involving the nervous system (R29.898)     Time: [id number] PT Time Calculation (min) (ACUTE ONLY): 21 min  Charges:  $Therapeutic Activity: 8-22 mins                     Wyona Almas, PT, DPT Acute Rehabilitation Services Pager [phone] Office [phone]    Deno Etienne 11/09/2020, 4:33 PM

## 2020-11-09 NOTE — Evaluation (Signed)
Speech Language Pathology Evaluation Patient Details Name: [name] MRN: [id number] DOB: August 05, 1955 Today's Date: 11/09/2020 Time: [id number] SLP Time Calculation (min) (ACUTE ONLY): 20 min  Problem List:  Patient Active Problem List   Diagnosis Date Noted   Stroke (cerebrum) ([place]) 11/07/2020   Hyperglycemia    NSTEMI (non-ST elevated myocardial infarction) ([place]) 06/06/2020   Musculoskeletal neck pain 02/17/2020   Incomplete uterine prolapse 11/23/2018   GERD (gastroesophageal reflux disease) 11/14/2018   Mild depression ([place]) 04/11/2018   Nonischemic cardiomyopathy ([place]) 08/31/2017   Acute respiratory failure with hypoxia ([place]) 06/02/2017   Pain in toes of both feet 01/26/2017   Bilateral carotid artery disease ([place]) 11/10/2016   Seasonal allergies 06/30/2016   Health care maintenance 06/16/2015   Nonproliferative diabetic retinopathy associated with type 2 diabetes mellitus ([place]) 06/15/2015   Osteoarthritis of right hip 06/09/2015   Type II diabetes mellitus with stage 3 chronic kidney disease ([place]) 07/17/2013   Chronic combined systolic and diastolic CHF, NYHA class 2 ([place]) 07/07/2013   CKD (chronic kidney disease) stage 3, GFR 30-59 ml/min ([place]) 11/22/2012   HTN (hypertension) 11/08/2012   Past Medical History:  Past Medical History:  Diagnosis Date   Abdominal fullness 09/26/2019   Anemia 07/17/2013   Arthritis    "hands, shoulders, hips, legs" (05/10/2018)   Arthritis of left hip 08/18/2013   S/p total hip arthroplasty.     CHF (congestive heart failure) ([place])    2014...@ [facility]   Chronic lower back pain    CKD (chronic kidney disease) stage 3, GFR 30-59 ml/min ([facility])    COPD (chronic obstructive pulmonary disease) ([place])    COVID-19 04/13/2020   Healthcare maintenance 06/16/2015   High cholesterol    Hypertension    Hypertensive emergency 08/02/2020   Hypertensive urgency 08/03/2020   Influenza A 06/01/2017   Influenza, pneumonia 06/01/2017   Left flank pain 10/11/2016    Myocardial infarction [facility])    "been told that I've had one; don't know when it would have been" (05/10/2018)   Problem with sexual relationship 10/13/2017   PVD (peripheral vascular disease) ([place])    Shortness of breath 03/12/2019   Type II diabetes mellitus ([place])    diagnosed 2001   Past Surgical History:  Past Surgical History:  Procedure Laterality Date   ABDOMINAL ANGIOGRAM  12/28/2012   ABDOMINAL ANGIOGRAM N/A 12/29/2011   Procedure: ABDOMINAL ANGIOGRAM;  Surgeon: Conrad Littlerock, MD;  Location: [facility] CATH LAB;  Service: Cardiovascular;  Laterality: N/A;   ABDOMINAL AORTAGRAM N/A 12/21/2012   Procedure: ABDOMINAL Maxcine Ham;  Surgeon: Elam Dutch, MD;  Location: [facility] CATH LAB;  Service: Cardiovascular;  Laterality: N/A;   CORONARY STENT INTERVENTION N/A 06/11/2020   Procedure: CORONARY STENT INTERVENTION;  Surgeon: [name], MD;  Location: [facility] CV LAB;  Service: Cardiovascular;  Laterality: N/A;   EYE SURGERY Bilateral ~ 2018   "laser; related to [name] diabetes"   JOINT REPLACEMENT     PERIPHERAL VASCULAR CATHETERIZATION     "had to have some veins unclogged cause I was in pain when I walked" (05/10/2018)   RIGHT/LEFT HEART CATH AND CORONARY ANGIOGRAPHY N/A 04/30/2018   Procedure: RIGHT/LEFT HEART CATH AND CORONARY ANGIOGRAPHY;  Surgeon: [name], MD;  Location: [place] CV LAB;  Service: Cardiovascular;  Laterality: N/A;   RIGHT/LEFT HEART CATH AND CORONARY ANGIOGRAPHY N/A 06/09/2020   Procedure: RIGHT/LEFT HEART CATH AND CORONARY ANGIOGRAPHY;  Surgeon: [name], MD;  Location: [place] [facility] CV LAB;  Service:  Cardiovascular;  Laterality: N/A;   TOTAL HIP ARTHROPLASTY Left 08/19/2013   Procedure: TOTAL HIP ARTHROPLASTY;  Surgeon: Kerin Salen, MD;  Location: [facility];  Service: Orthopedics;  Laterality: Left;   TUBAL LIGATION     HPI:  Pt is a 65 y.o. female who presented 7/9 with R gaze preference, slurred speech, and L-sided weakness. tPA was administered. CT no acute  finding but old right thalamic and BG lacunes. CTA head and neck b/l siphon severe stenosis, left ICA bulb 70% and right ICA bulb 50-60% stenosis. Pending MRI. PMH: HTN, HLD, DM2, CAD/NSTEMI s/p stent, L hip arthritis, COPD, MI, PVD, CKD, CHF.   Assessment / Plan / Recommendation Clinical Impression  Cognitive-linguistic evaluation complete. Patient presents with deficits in the areas of high level problem solving/reasoning, particularly mathmatical and during attempt to navigate and functionally [employer]. Did report some blurred vision so difficult to determine how much this is impacting ability. Patient was however independent prior to admission and would benefit from SLP f/u to maximize cognitive potential for return to independent living situation.    SLP Assessment  SLP Recommendation/Assessment: Patient needs continued Speech Lanaguage Pathology Services SLP Visit Diagnosis: Cognitive communication deficit (R41.841)    Follow Up Recommendations  Outpatient SLP    Frequency and Duration min 2x/week  1 week      SLP Evaluation Cognition  Overall Cognitive Status: Impaired/Different from baseline Arousal/Alertness: Awake/alert Orientation Level: Oriented to person;Oriented to place;Oriented to time;Oriented to situation Memory: Impaired Memory Impairment: Retrieval deficit Awareness: Appears intact Problem Solving: Appears intact Executive Function: Reasoning Reasoning: Impaired Reasoning Impairment: Verbal complex;Functional complex Safety/Judgment: Appears intact       Comprehension  Auditory Comprehension Overall Auditory Comprehension: Appears within functional limits for tasks assessed Visual Recognition/Discrimination Discrimination: Within Function Limits Reading Comprehension Reading Status: Within funtional limits    Expression Expression Primary Mode of Expression: Verbal Verbal Expression Overall Verbal Expression: Appears within functional limits for  tasks assessed Written Expression Dominant Hand: Left   Oral / Motor  Oral Motor/Sensory Function Overall Oral Motor/Sensory Function: Within functional limits Motor Speech Overall Motor Speech: Appears within functional limits for tasks assessed   GO                   Annagrace Carr MA, CCC-SLP  Martise Waddell Meryl 11/09/2020, 11:13 AM

## 2020-11-09 NOTE — Progress Notes (Signed)
Occupational Therapy Evaluation Patient Details Name: [name] MRN: [id number] DOB: Jun 01, 1955 Today's Date: 11/09/2020    History of Present Illness Pt is a 65 y.o. female who presented 7/9 with R gaze preference, slurred speech, and L-sided weakness. tPA was administered. 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 CTA head and neck b/l siphon severe stenosis, left ICA bulb 70% and right ICA bulb 50-60% stenosis. 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 PMH: HTN, HLD, DM2, CAD/NSTEMI s/p stent, L hip arthritis, COPD, MI, PVD, CKD, CHF.   Clinical Impression   PTA pt lives with her husband and daughter @ a modified independent level. Since having Covid in February, daughter states her mother has been mush more forgetful and repeating herself often. Pt has an antalgic gait pattern due to hip pain and requires min A for safety for ADL and IADL tasks. Recommend pt follow up with OT at a neuro outpt clinic after DC in addition to following up with her eye doctor given her hx of DM2 and CVA. Recommend pt refrain from driving at this time due to apparent attentional and higher level cognitive deficits.     Follow Up Recommendations  Outpatient OT;Other (comment) (S with mobility, ADL adn IADL tasks; no driving)    Equipment Recommendations  None recommended by OT    Recommendations for Other Services       Precautions / Restrictions Precautions Precautions: Fall      Mobility Bed Mobility               General bed mobility comments:  (OOB in chair)    Transfers Overall transfer level: Needs assistance Equipment used: Rolling walker (2 wheeled) Transfers: Sit to/from Stand Sit to Stand: Min guard              Balance Overall balance assessment: Needs assistance Sitting-balance support: No upper extremity supported;Feet supported Sitting balance-Leahy Scale: Good Sitting balance - Comments: Able to reach mod  off BOS to try to donn socks with supervision.   Standing balance support: Bilateral upper extremity supported;During functional activity Standing balance-Leahy Scale: Poor Standing balance comment: Reliant on UE support with mobility.                           ADL either performed or assessed with clinical judgement   ADL Overall ADL's : Needs assistance/impaired Eating/Feeding: Modified independent   Grooming: Supervision/safety;Standing   Upper Body Bathing: Set up;Sitting   Lower Body Bathing: Min guard;Sit to/from stand   Upper Body Dressing : Set up;Sitting   Lower Body Dressing: Min guard;Sit to/from stand   Toilet Transfer: Ambulation;Min guard ()   Toileting- Clothing Manipulation and Hygiene: Sit to/from stand;Minimal assistance Toileting - Clothing Manipulation Details (indicate cue type and reason): VC to pull up panties after washing up at sink; began walking away with panties down     Functional mobility during ADLs: Min guard       Vision Baseline Vision/History: Wears glasses;Cataracts Wears Glasses: Reading only Patient Visual Report: No change from baseline;Blurring of vision Additional Comments: reading normally with glasses on; reports blurred vision L eye prior to stroke.     Perception Perception Comments: mild L inattention noted during functional tasks   Praxis      Pertinent Vitals/Pain Pain Assessment: Faces Faces Pain Scale: Hurts a little bit Pain Location: hips Pain Descriptors / Indicators: Discomfort;Guarding Pain Intervention(s):  Limited activity within patient's tolerance     Hand Dominance Left   Extremity/Trunk Assessment Upper Extremity Assessment Upper Extremity Assessment: LUE deficits/detail LUE Deficits / Details: Mild weakness adn coordination deficits; using functionally LUE Sensation:  (denies numbness/tingling)   Lower Extremity Assessment Lower Extremity Assessment: Defer to PT evaluation LLE  Sensation:  (denies numbness/tingling)   Cervical / Trunk Assessment Cervical / Trunk Assessment: Normal   Communication Communication Communication: No difficulties   Cognition Arousal/Alertness: Awake/alert Behavior During Therapy: WFL for tasks assessed/performed Overall Cognitive Status: Impaired/Different from baseline Area of Impairment: Memory;Problem solving;Attention;Safety/judgement;Awareness                   Current Attention Level: Selective Memory: Decreased short-term memory   Safety/Judgement: Decreased awareness of safety;Decreased awareness of deficits Awareness: Emergent Problem Solving: Slow processing General Comments: Daughter reporting pt is forgetting things intermittently then able to recall a moment later. Slower to process info compared to norm, per daughter; will further assess as pt was driving and managing her own medications   General Comments  daughter reports Mom more forgetful since having Covid in Feb    Exercises     [place] expects to be discharged to:: Private residence Living Arrangements: Spouse/significant other Available Help at Discharge: Family;Available 24 hours/day Type of Home: Apartment Home Access: Stairs to enter Entrance Stairs-Number of Steps: 3 (can go around in grass) Entrance Stairs-Rails: None (can go around in grass) Home Layout: One level     Bathroom Shower/Tub: Teacher, early years/pre: Standard     Home Equipment: Shower seat;Grab bars - tub/shower;Bedside commode;Cane - single point;Walker - 2 wheels;Wheelchair - [employer] - 4 wheels   Additional Comments: May d/c home with her son in another state, unsure of house set-up there. Husband is sick with CHF and unable to physically care for her.  Lives With: Spouse    Prior Functioning/Environment Level of Independence: Independent with assistive device(s)        Comments: uses SPC         OT Problem List: Decreased strength;Decreased coordination;Decreased cognition;Decreased safety awareness;Decreased knowledge of use of DME or AE;Pain;Impaired UE functional use;Decreased activity tolerance      OT Treatment/Interventions: Self-care/ADL training;Therapeutic exercise;Neuromuscular education;Energy conservation;DME and/or AE instruction;Therapeutic activities;Cognitive remediation/compensation;Visual/perceptual remediation/compensation;Patient/family education;Balance training    OT Goals(Current goals can be found in the care plan section) Acute Rehab OT Goals Patient Stated Goal: to go home with her daughter OT Goal Formulation: With patient/family Time For Goal Achievement: 11/23/20 Potential to Achieve Goals: Good  OT Frequency: Min 2X/week   Barriers to D/C:            Co-evaluation              AM-PAC OT "6 Clicks" Daily Activity     Outcome Measure Help from another person eating meals?: None Help from another person taking care of personal grooming?: A Little Help from another person toileting, which includes using toliet, bedpan, or urinal?: A Little Help from another person bathing (including washing, rinsing, drying)?: A Little Help from another person to put on and taking off regular upper body clothing?: A Little Help from another person to put on and taking off regular lower body clothing?: A Little 6 Click Score: 19   End of Session Equipment Utilized During Treatment: Gait belt Nurse Communication: Mobility status  Activity Tolerance: Patient tolerated treatment well Patient left: in chair;with call bell/phone within reach;with chair  alarm set  OT Visit Diagnosis: Unsteadiness on feet (R26.81);Muscle weakness (generalized) (M62.81);Other symptoms and signs involving cognitive function;Pain Pain - Right/Left: Right Pain - part of body: Hip                Time: [id number] OT Time Calculation (min): 31 min Charges:  OT General Charges $OT  Visit: 1 Visit OT Evaluation $OT Eval Moderate Complexity: 1 Mod OT Treatments $Self Care/Home Management : 8-22 mins  Maurie Boettcher, OT/L   Acute OT Clinical Specialist [place] Pager [phone] Office [phone]   [facility] 11/09/2020, 1:51 PM

## 2020-11-10 ENCOUNTER — Other Ambulatory Visit (HOSPITAL_COMMUNITY): Payer: Self-pay

## 2020-11-10 ENCOUNTER — Other Ambulatory Visit: Payer: Self-pay | Admitting: Internal Medicine

## 2020-11-10 DIAGNOSIS — I509 Heart failure, unspecified: Secondary | ICD-10-CM | POA: Diagnosis not present

## 2020-11-10 DIAGNOSIS — E1122 Type 2 diabetes mellitus with diabetic chronic kidney disease: Secondary | ICD-10-CM | POA: Diagnosis not present

## 2020-11-10 DIAGNOSIS — Z87891 Personal history of nicotine dependence: Secondary | ICD-10-CM | POA: Diagnosis not present

## 2020-11-10 DIAGNOSIS — N1832 Chronic kidney disease, stage 3b: Secondary | ICD-10-CM | POA: Diagnosis not present

## 2020-11-10 DIAGNOSIS — E785 Hyperlipidemia, unspecified: Secondary | ICD-10-CM | POA: Diagnosis not present

## 2020-11-10 DIAGNOSIS — E7800 Pure hypercholesterolemia, unspecified: Secondary | ICD-10-CM | POA: Diagnosis not present

## 2020-11-10 DIAGNOSIS — I63412 Cerebral infarction due to embolism of left middle cerebral artery: Secondary | ICD-10-CM | POA: Diagnosis not present

## 2020-11-10 DIAGNOSIS — N183 Chronic kidney disease, stage 3 unspecified: Secondary | ICD-10-CM | POA: Diagnosis not present

## 2020-11-10 DIAGNOSIS — I130 Hypertensive heart and chronic kidney disease with heart failure and stage 1 through stage 4 chronic kidney disease, or unspecified chronic kidney disease: Secondary | ICD-10-CM | POA: Diagnosis not present

## 2020-11-10 DIAGNOSIS — E1165 Type 2 diabetes mellitus with hyperglycemia: Secondary | ICD-10-CM | POA: Diagnosis not present

## 2020-11-10 DIAGNOSIS — I10 Essential (primary) hypertension: Secondary | ICD-10-CM | POA: Diagnosis not present

## 2020-11-10 DIAGNOSIS — I5021 Acute systolic (congestive) heart failure: Secondary | ICD-10-CM

## 2020-11-10 DIAGNOSIS — I6523 Occlusion and stenosis of bilateral carotid arteries: Secondary | ICD-10-CM | POA: Diagnosis not present

## 2020-11-10 DIAGNOSIS — I255 Ischemic cardiomyopathy: Secondary | ICD-10-CM | POA: Diagnosis not present

## 2020-11-10 DIAGNOSIS — I639 Cerebral infarction, unspecified: Secondary | ICD-10-CM | POA: Diagnosis not present

## 2020-11-10 LAB — BASIC METABOLIC PANEL
Anion gap: 10 (ref 5–15)
BUN: 27 mg/dL — ABNORMAL HIGH (ref 8–23)
CO2: 20 mmol/L — ABNORMAL LOW (ref 22–32)
Calcium: 9.1 mg/dL (ref 8.9–10.3)
Chloride: 106 mmol/L (ref 98–111)
Creatinine, Ser: 1.85 mg/dL — ABNORMAL HIGH (ref 0.44–1.00)
GFR, Estimated: 30 mL/min — ABNORMAL LOW (ref >60)
Glucose, Bld: 164 mg/dL — ABNORMAL HIGH (ref 70–99)
Potassium: 3.9 mmol/L (ref 3.5–5.1)
Sodium: 136 mmol/L (ref 135–145)

## 2020-11-10 LAB — CBC
HCT: 32.5 % — ABNORMAL LOW (ref 36.0–46.0)
Hemoglobin: 10.6 g/dL — ABNORMAL LOW (ref 12.0–15.0)
MCH: 29 pg (ref 26.0–34.0)
MCHC: 32.6 g/dL (ref 30.0–36.0)
MCV: 88.8 fL (ref 80.0–100.0)
Platelets: 216 10*3/uL (ref 150–400)
RBC: 3.66 MIL/uL — ABNORMAL LOW (ref 3.87–5.11)
RDW: 15.1 % (ref 11.5–15.5)
WBC: 6.1 10*3/uL (ref 4.0–10.5)
nRBC: 0 % (ref 0.0–0.2)

## 2020-11-10 LAB — GLUCOSE, CAPILLARY
Glucose-Capillary: 191 mg/dL — ABNORMAL HIGH (ref 70–99)
Glucose-Capillary: 297 mg/dL — ABNORMAL HIGH (ref 70–99)
Glucose-Capillary: 191 mg/dL — ABNORMAL HIGH (ref 70–99)
Glucose-Capillary: 313 mg/dL — ABNORMAL HIGH (ref 70–99)

## 2020-11-10 MED ORDER — ATORVASTATIN CALCIUM 80 MG PO TABS
80.0000 mg | ORAL_TABLET | Freq: Every day | ORAL | 1 refills | Status: DC
  Filled 2020-11-10: qty 30, 30d supply, fill #0

## 2020-11-10 MED ORDER — OMEPRAZOLE 20 MG PO CPDR
20.0000 mg | DELAYED_RELEASE_CAPSULE | Freq: Every day | ORAL | 0 refills | Status: DC | PRN
  Filled 2020-11-10: qty 30, 30d supply, fill #0

## 2020-11-10 MED ORDER — ASPIRIN 81 MG PO TBEC
81.0000 mg | DELAYED_RELEASE_TABLET | Freq: Every day | ORAL | 11 refills | Status: DC
  Filled 2020-11-10 – 2021-01-18 (×2): qty 30, 30d supply, fill #0

## 2020-11-10 NOTE — Progress Notes (Addendum)
STROKE TEAM PROGRESS NOTE   SUBJECTIVE (INTERVAL HISTORY) She is ready to d/c home after vascular surgery consult and CUS is completed to help facilitate her out pt f/u in 4 weeks for possible CEA/TCAR.   OBJECTIVE Temp:  [98.7 F (37.1 C)-99.6 F (37.6 C)] 98.7 F (37.1 C) (07/12 1600) Pulse Rate:  [69-81] 76 (07/12 1600) Cardiac Rhythm: Normal sinus rhythm (07/12 0800) Resp:  [21-25] 21 (07/12 0400) BP: (118-157)/(59-93) 130/78 (07/12 1600) SpO2:  [95 %-97 %] 95 % (07/12 1600)  Recent Labs  Lab 11/09/20 1633 11/09/20 2113 11/10/20 0800 11/10/20 1118 11/10/20 1645  GLUCAP 225* 240* 191* 297* 191*    Recent Labs  Lab 11/07/20 1641 11/09/20 0327 11/10/20 0336  NA 136 138 136  K 4.7 3.7 3.9  CL 105 107 106  CO2 16* 20* 20*  GLUCOSE 501* 193* 164*  BUN 26* 22 27*  CREATININE 2.18* 1.75* 1.85*  CALCIUM 8.8* 8.9 9.1    Recent Labs  Lab 11/07/20 1641  AST 25  ALT 15  ALKPHOS 90  BILITOT 0.5  PROT 6.3*  ALBUMIN 2.8*    Recent Labs  Lab 11/07/20 1641 11/09/20 0327 11/10/20 0336  WBC 5.3 6.6 6.1  NEUTROABS 3.4  --   --   HGB 12.3 10.6* 10.6*  HCT 41.0 32.8* 32.5*  MCV 96.5 89.6 88.8  PLT 160 208 216    No results for input(s): CKTOTAL, CKMB, CKMBINDEX, TROPONINI in the last 168 hours. Recent Labs    11/07/20 1925  LABPROT 15.7*  INR 1.3*    No results for input(s): COLORURINE, LABSPEC, PHURINE, GLUCOSEU, HGBUR, BILIRUBINUR, KETONESUR, PROTEINUR, UROBILINOGEN, NITRITE, LEUKOCYTESUR in the last 72 hours.  Invalid input(s): APPERANCEUR     Component Value Date/Time   CHOL 222 (H) 11/07/2020 2350   CHOL 149 08/31/2017 0941   TRIG 75 11/07/2020 2350   HDL 51 11/07/2020 2350   HDL 60 08/31/2017 0941   CHOLHDL 4.4 11/07/2020 2350   VLDL 15 11/07/2020 2350   LDLCALC 156 (H) 11/07/2020 2350   LDLCALC 74 08/31/2017 0941   Lab Results  Component Value Date   HGBA1C 9.2 (H) 11/07/2020      Component Value Date/Time   LABOPIA NONE DETECTED  11/08/2020 0840   COCAINSCRNUR NONE DETECTED 11/08/2020 0840   LABBENZ NONE DETECTED 11/08/2020 0840   AMPHETMU NONE DETECTED 11/08/2020 0840   THCU POSITIVE (A) 11/08/2020 0840   LABBARB NONE DETECTED 11/08/2020 0840    No results for input(s): ETH in the last 168 hours.  I have personally reviewed the radiological images below and agree with the radiology interpretations.  MR BRAIN WO CONTRAST  Result Date: 11/08/2020 CLINICAL DATA:  Stroke, follow-up status post tPA EXAM: MRI HEAD WITHOUT CONTRAST TECHNIQUE: Multiplanar, multiecho pulse sequences of the brain and surrounding structures were obtained without intravenous contrast. COMPARISON:  CT exams from yesterday. FINDINGS: Brain: Acute infarct in the right frontal lobe, operculum, and anterior insula.Associated edema without significant mass effect. No evidence of acute mass occupying hemorrhagic transformation. There are numerous small chronic microhemorrhages in bilateral basal ganglia and thalami and the brainstem, as well as a few in the supratentorial white matter. Remote lacunar infarcts in the left corona radiata, right basal ganglia, right thalamus, midbrain and pons. No mass lesion or extra-axial fluid collection. No midline shift. Basal cisterns are patent. Vascular: Major arterial flow voids are maintained at the skull base. Skull and upper cervical spine: Normal marrow signal. Sinuses/Orbits: Moderate ethmoid, maxillary, and right sphenoid  sinus mucosal thickening. Other: No sizable mastoid effusions. IMPRESSION: 1. Acute infarct in the right frontal lobe, operculum, and anterior insula. Associated edema without significant mass effect. Remote lacunar infarcts in the left corona radiata, right basal ganglia, right thalamus, midbrain and pons. 2. Numerous prior microhemorrhages that are predominantly in the basal ganglia/thalami and brainstem, most likely secondary to hypertension. 3. Ventriculomegaly, which is mildly out of proportion  to the degree of sulcal volume loss. Additionally, the callosal angle is somewhat acute and there is crowding of sulci at the vertex. Findings could be secondary to normal pressure hydrocephalus or central predominant atrophy with ex vacuo ventricular dilation. 4. Moderate paranasal sinus mucosal thickening. Electronically Signed   By: Margaretha Sheffield MD   On: 11/08/2020 15:30   ECHOCARDIOGRAM COMPLETE  Result Date: 11/08/2020    ECHOCARDIOGRAM REPORT   Patient Name:   [name] Date of Exam: 11/08/2020 Medical Rec #:  [id number]      Height:       63.0 in Accession #:    [id number]     Weight:       160.7 lb Date of Birth:  Feb 23, 1956     BSA:          1.762 m Patient Age:    65 years       BP:           149/82 mmHg Patient Gender: F              HR:           79 bpm. Exam Location:  Inpatient Procedure: 2D Echo, 3D Echo, Cardiac Doppler, Color Doppler and Intracardiac            Opacification Agent Indications:    Stroke  History:        Patient has prior history of Echocardiogram examinations, most                 recent 06/06/2020. CHF, CAD and Previous Myocardial Infarction,                 COPD; Risk Factors:Hypertension, Dyslipidemia and Diabetes.  Sonographer:    Clayton Lefort RDCS (AE) Referring Phys: [place]  1. Left ventricular ejection fraction, by estimation, is 45 to 50%. The left ventricle has mildly decreased function. The left ventricle demonstrates global hypokinesis. There is moderate concentric left ventricular hypertrophy. Left ventricular diastolic parameters are consistent with Grade II diastolic dysfunction (pseudonormalization). Elevated left atrial pressure. There is very mild hypokinesis of the left ventricular, basal-mid inferior wall and inferoseptal wall. There is profound LBBB-related septal-lateral wall dyssynchrony that accounts for most of the decrease in LVEF.  2. Right ventricular systolic function is normal. The right ventricular size is normal.  There is mildly elevated pulmonary artery systolic pressure.  3. Left atrial size was mild to moderately dilated.  4. The mitral valve is degenerative. Mild mitral valve regurgitation. No evidence of mitral stenosis. Moderate to severe mitral annular calcification.  5. The aortic valve is tricuspid. There is moderate calcification of the aortic valve. There is moderate thickening of the aortic valve. Aortic valve regurgitation is mild. Mild to moderate aortic valve sclerosis/calcification is present, without any evidence of aortic stenosis.  6. The inferior vena cava is normal in size with greater than 50% respiratory variability, suggesting right atrial pressure of 3 mmHg. Comparison(s): Prior images reviewed side by side. The left ventricular function has improved. The left ventricular wall motion has  improved. FINDINGS  Left Ventricle: There is profound LBBB-related septal-lateral wall dyssynchrony that accounts for most of the decrease in LVEF. Left ventricular ejection fraction, by estimation, is 45 to 50%. The left ventricle has mildly decreased function. The left ventricle demonstrates global hypokinesis. Mild hypokinesis of the left ventricular, basal-mid inferior wall and inferoseptal wall. Definity contrast agent was given IV to delineate the left ventricular endocardial borders. 3D left ventricular ejection fraction analysis performed but not reported based on interpreter judgement due to suboptimal quality. The left ventricular internal cavity size was normal in size. There is moderate concentric left ventricular hypertrophy. Abnormal (paradoxical) septal motion, consistent with left bundle branch block. Left ventricular diastolic parameters are consistent with Grade II diastolic dysfunction (pseudonormalization). Elevated left atrial pressure. Right Ventricle: The right ventricular size is normal. No increase in right ventricular wall thickness. Right ventricular systolic function is normal. There is  mildly elevated pulmonary artery systolic pressure. The tricuspid regurgitant velocity is 2.76  m/s, and with an assumed right atrial pressure of 3 mmHg, the estimated right ventricular systolic pressure is [id number] mmHg. Left Atrium: Left atrial size was mild to moderately dilated. Right Atrium: Right atrial size was normal in size. Pericardium: There is no evidence of pericardial effusion. Mitral Valve: The mitral valve is degenerative in appearance. Moderate to severe mitral annular calcification. Mild mitral valve regurgitation. No evidence of mitral valve stenosis. MV peak gradient, 5.4 mmHg. The mean mitral valve gradient is 3.0 mmHg. Tricuspid Valve: The tricuspid valve is normal in structure. Tricuspid valve regurgitation is not demonstrated. Aortic Valve: There is a broad-based fixed calcified echodensity on the tip of the noncoronary cusp 4x7 mm in size. It probably represents degenerative change, but may be a healed vegetation. The aortic valve is tricuspid. There is moderate calcification  of the aortic valve. There is moderate thickening of the aortic valve. Aortic valve regurgitation is mild. Aortic regurgitation PHT measures 373 msec. Mild to moderate aortic valve sclerosis/calcification is present, without any evidence of aortic stenosis. Aortic valve mean gradient measures 4.0 mmHg. Aortic valve peak gradient measures 8.3 mmHg. Aortic valve area, by VTI measures 1.79 cm. Pulmonic Valve: The pulmonic valve was normal in structure. Pulmonic valve regurgitation is not visualized. Aorta: The aortic root and ascending aorta are structurally normal, with no evidence of dilitation. Venous: The inferior vena cava is normal in size with greater than 50% respiratory variability, suggesting right atrial pressure of 3 mmHg. IAS/Shunts: No atrial level shunt detected by color flow Doppler.  LEFT VENTRICLE PLAX 2D LVIDd:         4.20 cm      Diastology LVIDs:         3.60 cm      LV e' medial:    3.45 cm/s LV PW:          1.50 cm      LV E/e' medial:  34.2 LV IVS:        1.70 cm      LV e' lateral:   6.83 cm/s LVOT diam:     1.90 cm      LV E/e' lateral: 17.3 LV SV:         52 LV SV Index:   30 LVOT Area:     2.84 cm                              3D Volume EF: LV Volumes (MOD)  3D EF:        36 % LV vol d, MOD A2C: 98.1 ml  LV EDV:       125 ml LV vol d, MOD A4C: 110.0 ml LV ESV:       80 ml LV vol s, MOD A2C: 54.0 ml  LV SV:        45 ml LV vol s, MOD A4C: 69.0 ml LV SV MOD A2C:     44.1 ml LV SV MOD A4C:     110.0 ml LV SV MOD BP:      46.5 ml RIGHT VENTRICLE             IVC RV Basal diam:  2.80 cm     IVC diam: 1.80 cm RV S prime:     10.40 cm/s TAPSE (M-mode): 2.0 cm LEFT ATRIUM           Index       RIGHT ATRIUM           Index LA diam:      4.10 cm 2.33 cm/m  RA Area:     14.00 cm LA Vol (A4C): 65.0 ml 36.89 ml/m RA Volume:   32.20 ml  18.27 ml/m  AORTIC VALVE AV Area (Vmax):    1.89 cm AV Area (Vmean):   1.56 cm AV Area (VTI):     1.79 cm AV Vmax:           144.00 cm/s AV Vmean:          97.500 cm/s AV VTI:            0.291 m AV Peak Grad:      8.3 mmHg AV Mean Grad:      4.0 mmHg LVOT Vmax:         95.90 cm/s LVOT Vmean:        53.500 cm/s LVOT VTI:          0.184 m LVOT/AV VTI ratio: 0.63 AI PHT:            373 msec  AORTA Ao Root diam: 2.70 cm Ao Asc diam:  3.30 cm MITRAL VALVE                TRICUSPID VALVE MV Area (PHT): 5.27 cm     TR Peak grad:   30.5 mmHg MV Peak grad:  5.4 mmHg     TR Vmax:        276.00 cm/s MV Mean grad:  3.0 mmHg MV Vmax:       1.16 m/s     SHUNTS MV Vmean:      87.7 cm/s    Systemic VTI:  0.18 m MV Decel Time: 144 msec     Systemic Diam: 1.90 cm MV E velocity: 118.00 cm/s MV A velocity: 103.00 cm/s MV E/A ratio:  1.15 Mihai Croitoru MD Electronically signed by Sanda Klein MD Signature Date/Time: 11/08/2020/5:09:44 PM    Final    CT HEAD CODE STROKE WO CONTRAST  Result Date: 11/07/2020 CLINICAL DATA:  Code stroke.  Neuro deficit, acute stroke suspected. EXAM: CT HEAD  WITHOUT CONTRAST TECHNIQUE: Contiguous axial images were obtained from the base of the skull through the vertex without intravenous contrast. COMPARISON:  October 03, 2007. FINDINGS: Brain: Remote lacunar infarct in the right thalamus and right basal ganglia, present on the prior CT head. No evidence of acute large vascular territory infarct or acute hemorrhage. Progressive patchy white matter hypoattenuation, most likely related  to chronic microvascular ischemic disease. Progressive ventriculomegaly, which is mildly out of proportion to the degree of sulcal volume loss. Additionally, the callosum angle is somewhat acute and there is crowding of sulci at the vertex Vascular: No hyperdense vessel identified. Calcific atherosclerosis. Skull: No acute fracture. Sinuses/Orbits: Moderate mucosal thickening. No acute orbital findings. Other: No mastoid effusions. ASPECTS [facility] Stroke Program Early CT Score) total score (0-10 with 10 being normal): 10. IMPRESSION: 1. No evidence of acute large vascular territory infarct or acute hemorrhage. ASPECTS is 10 2. Progressive white matter hypoattenuation, most likely related to chronic microvascular ischemic disease. An MRI could provide more sensitive evaluation for acute white matter infarct if clinically indicated. 3. Remote infarcts in the right thalamus, right basal ganglia. 4. Progressive ventriculomegaly, which is mildly out of proportion to the degree of sulcal volume loss. Additionally, the callosal angle is somewhat acute and there is crowding of sulci at the vertex. Consider normal pressure hydrocephalus. Central predominant atrophy with ex vacuo ventricular dilation is a differential consideration. Findings discussed with Dr. Leonel Ramsay at 5:06 p.m. 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 This addendum was discussed with Dr. Lorrin Goodell via telephone at 7:40 p.m. Electronically Signed   By: Margaretha Sheffield MD   On: 11/07/2020 19:40   Result Date: 11/07/2020 CLINICAL DATA:  Stroke/TIA. EXAM: CT HEAD WITHOUT CONTRAST CT ANGIOGRAPHY OF THE HEAD AND NECK TECHNIQUE: Contiguous axial images were obtained from the base of the skull through the vertex without intravenous contrast. Multidetector CT imaging of the head and neck was performed using the standard protocol during bolus administration of intravenous contrast. Multiplanar CT image reconstructions and MIPs were obtained to evaluate the vascular anatomy. Carotid stenosis measurements (when applicable) are obtained utilizing NASCET criteria, using the distal internal carotid diameter as the denominator. CONTRAST:  102m OMNIPAQUE IOHEXOL 350 MG/ML SOLN COMPARISON:  Same day CT head. FINDINGS: CTA NECK Aortic arch: Great vessel origins are patent. Right carotid system: Calcific and noncalcific atherosclerosis at the carotid bifurcation with 50-60% stenosis of the ICA origin. Left carotid system: Calcific and noncalcific atherosclerosis involving the carotid bifurcation and proximal ICA with approximately 70%. Vertebral arteries:Mildly right dominant. There is moderate stenosis of the right vertebral artery at the C6-C7 level due to mass effect from an adjacent osteophyte Skeleton: Moderate to severe multilevel degenerative change. Other neck: No acute abnormality.  Visualized lung apices are clear. CTA HEAD Anterior circulation: Extensive calcific atherosclerosis of bilateral cavernous and paraclinoid ICAs with severe right and moderate left cavernous and paraclinoid ICA stenosis. Bilateral MCAs are patent. Bilateral ACAs are patent. No aneurysm identified. Posterior circulation: Bilateral intradural vertebral arteries are patent. Mild-to-moderate mid basilar artery  stenosis. Bilateral PCAs are patent. Mild multifocal stenosis of bilateral PCAs. No aneurysm identified. Venous sinuses: Within limitation non arterial timing, no evidence of dural venous sinus thrombosis. IMPRESSION: CTA head: 1. No large vessel occlusion. 2. 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 3. Mild to moderate stenosis of the mid basilar artery and mild bilateral PCA stenosis. CTA Neck: 1. Approximately 70% stenosis of the proximal left ICA. 2. Approximately 50-60% stenosis of the right ICA origin 3. Moderate stenosis of the right vertebral artery at the C6-C7 level  due to mass effect from an adjacent osteophyte Electronically Signed: By: Margaretha Sheffield MD On: 11/07/2020 18:09   CT ANGIO NECK CODE STROKE  Addendum Date: 11/07/2020   ADDENDUM REPORT: 11/07/2020 19:40 ADDENDUM: On further review, there is a 2 mm posteriorly directed right paraclinoid ICA aneurysm (series 12, image 427; series 9, image 121). This addendum was discussed with Dr. Lorrin Goodell via telephone at 7:40 p.m. Electronically Signed   By: Margaretha Sheffield MD   On: 11/07/2020 19:40   Result Date: 11/07/2020 CLINICAL DATA:  Stroke/TIA. EXAM: CT HEAD WITHOUT CONTRAST CT ANGIOGRAPHY OF THE HEAD AND NECK TECHNIQUE: Contiguous axial images were obtained from the base of the skull through the vertex without intravenous contrast. Multidetector CT imaging of the head and neck was performed using the standard protocol during bolus administration of intravenous contrast. Multiplanar CT image reconstructions and MIPs were obtained to evaluate the vascular anatomy. Carotid stenosis measurements (when applicable) are obtained utilizing NASCET criteria, using the distal internal carotid diameter as the denominator. CONTRAST:  21m OMNIPAQUE IOHEXOL 350 MG/ML SOLN COMPARISON:  Same day CT head. FINDINGS: CTA NECK Aortic arch: Great vessel origins are patent. Right carotid system: Calcific and noncalcific  atherosclerosis at the carotid bifurcation with 50-60% stenosis of the ICA origin. Left carotid system: Calcific and noncalcific atherosclerosis involving the carotid bifurcation and proximal ICA with approximately 70%. Vertebral arteries:Mildly right dominant. There is moderate stenosis of the right vertebral artery at the C6-C7 level due to mass effect from an adjacent osteophyte Skeleton: Moderate to severe multilevel degenerative change. Other neck: No acute abnormality.  Visualized lung apices are clear. CTA HEAD Anterior circulation: Extensive calcific atherosclerosis of bilateral cavernous and paraclinoid ICAs with severe right and moderate left cavernous and paraclinoid ICA stenosis. Bilateral MCAs are patent. Bilateral ACAs are patent. No aneurysm identified. Posterior circulation: Bilateral intradural vertebral arteries are patent. Mild-to-moderate mid basilar artery stenosis. Bilateral PCAs are patent. Mild multifocal stenosis of bilateral PCAs. No aneurysm identified. Venous sinuses: Within limitation non arterial timing, no evidence of dural venous sinus thrombosis. IMPRESSION: CTA head: 1. No large vessel occlusion. 2. 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 3. Mild to moderate stenosis of the mid basilar artery and mild bilateral PCA stenosis. CTA Neck: 1. Approximately 70% stenosis of the proximal left ICA. 2. Approximately 50-60% stenosis of the right ICA origin 3. Moderate stenosis of the right vertebral artery at the C6-C7 level due to mass effect from an adjacent osteophyte Electronically Signed: By: FMargaretha SheffieldMD On: 11/07/2020 18:09      PHYSICAL EXAM  Temp:  [98.7 F (37.1 C)-99.6 F (37.6 C)] 98.7 F (37.1 C) (07/12 1600) Pulse Rate:  [69-81] 76 (07/12 1600) Resp:  [21-25] 21 (07/12 0400) BP: (118-157)/(59-93) 130/78 (07/12 1600) SpO2:  [95 %-97 %] 95 % (07/12 1600)  General - Well nourished, well developed, in no apparent  distress.  Ophthalmologic - fundi not visualized due to noncooperation.  Cardiovascular - Regular rhythm and rate.  Mental Status -  A&Ox4. Language including expression, naming, repetition, comprehension was assessed and found intact. Fund of Knowledge was assessed and was intact.  Cranial Nerves II - XII - II - Visual field intact III, IV, VI - Extraocular movements intact. V - Facial sensation intact bilaterally. VII - mild left flattening of naso-labial fold. VIII - Hearing & vestibular intact bilaterally. X - Palate elevates symmetrically. XI - Chin turning & shoulder shrug intact bilaterally. XII - Tongue protrusion intact.  Motor Strength - The patient's strength was normal in all extremities and pronator drift was absent.  Bulk was normal and fasciculations were absent.   Motor Tone - Muscle tone was assessed at the neck and appendages and was normal.  Reflexes - The patient's reflexes were symmetrical in all extremities and she had no pathological reflexes.  Sensory - Intact to light touch and symmetrical.    Coordination - The patient had normal movements in the hands and feet with no ataxia or dysmetria.  Tremor was absent.  Gait and Station - deferred.  ASSESSMENT/PLAN [name] is a 65 y.o. female with history of HTN, HLD, DM, CAD/NSTEMI s/p stent, CKD, CHF admitted for left facial droop, slurry speech and right gaze preference. tPA was given.    Stroke: Rt MCA, etiology could be d/t RICA stenosis vs. CHF. Vascular Sx was consulted and they plan for close out pt f/u in 4 weeks for possible TCAR vs CEA.  Resultant left facial droop CT no acute finding but old right thalamic and BG lacunes CTA head and neck b/l siphon severe stenosis, left ICA bulb 70% and right ICA bulb 50-60% stenosis MRI Acute infarct in the right frontal lobe, operculum, and anterior insula. Associated edema without significant mass effect. Remote lacunar infarcts in the left corona  radiata, right basal ganglia, right thalamus, midbrain and pons. 2. Numerous prior microhemorrhages that are predominantly in the basal ganglia/thalami and brainstem, most likely secondary to hypertension. 3. Ventriculomegaly, which is mildly out of proportion to the degree of sulcal volume loss. Additionally, the callosal angle is somewhat acute and there is crowding of sulci at the vertex. Findings could be secondary to normal pressure hydrocephalus or central predominant atrophy with ex vacuo ventricular dilation. 2D Echo  45% EF, LV has global hypokinesis, mod LVH. LA dilation LDL 156 HgbA1c 9.2 UDS + for THC SCDs for VTE prophylaxis aspirin 81 mg daily and Brilinta (ticagrelor) 90 mg bid prior to admission, now restarted on aspirin and Brilinta.  Patient counseled to be compliant with her antithrombotic medications Ongoing aggressive stroke risk factor management Therapy recommendations: Outpatient PT/OT Disposition:  pending  Cerebrovascular stenosis CTA head and neck b/l siphon severe stenosis, left ICA bulb 70% and right ICA bulb 50-60% stenosis CUS: will be done prior to d/c so this is avail for out pt follow up and surgical planning (out pt vascular sx to f/u on results) Vascular surgery consulted for right ICA stenosis and plan made for out pt close f/u in 4 weeks for consideration of CEA/TCAR.  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 Pt was seen and examined.   No acute event overnight, patient neuro stable.  Still pending vascular surgery consultation for their opinion regarding carotid  stenosis.  BP stable after starting heart failure medications.  Continue current management.  For detailed assessment and plan, please refer to above as I have made changes wherever appropriate.   Rosalin Hawking, MD PhD Stroke Neurology 11/10/2020 7:37 PM    To contact Stroke Continuity provider, please refer to [email or web address] After hours, contact General Neurology

## 2020-11-10 NOTE — TOC CAGE-AID Note (Signed)
Transition of Care [facility]) - CAGE-AID Screening   Patient Details  Name: [name] MRN: [id number] Date of Birth: 08-18-1955  Transition of Care [facility]) CM/SW Contact:    Kailana Benninger C Tarpley-Carter, [place] Phone Number: 11/10/2020, 11:19 AM   Clinical Narrative: Pt is unable to participate in Cage Aid.   Maveryk Renstrom Tarpley-Carter, MSW, LCSW-A Pronouns:  She/Her/Hers                          [place] Clinical Social WorkerTransitions of Care Cell:  [phone] [email or web address]   CAGE-AID Screening: Substance Abuse Screening unable to be completed due to: : Patient unable to participate

## 2020-11-10 NOTE — Progress Notes (Signed)
Inpatient Diabetes Program Recommendations  AACE/ADA: New Consensus Statement on Inpatient Glycemic Control (2015)  Target Ranges:  Prepandial:   less than 140 mg/dL      Peak postprandial:   less than 180 mg/dL (1-2 hours)      Critically ill patients:  140 - 180 mg/dL   Lab Results  Component Value Date   GLUCAP 191 (H) 11/10/2020   HGBA1C 9.2 (H) 11/07/2020    Review of Glycemic Control Results for JANAT, SAUNDERSON (MRN [id number]) as of 11/10/2020 09:20  Ref. Range 06/08/2020 02:28 07/09/2020 17:01 11/07/2020 23:50  Hemoglobin A1C Latest Ref Range: 4.8 - 5.6 % >15.5 (H) 11.9 (H) 9.2 (H)   Diabetes history: DM 2 Outpatient Diabetes medications: Metformin 1000 mg bid Current orders for Inpatient glycemic control:  Novolog 0-15 units tid  Spoke with pt over the phone regarding A1c and glucose control at home. Pt has brought her A1c level down from over 15% in 06/2020 to 9.2% this admission. Pt reports not having metformin for 1 week prior to admission. She will need a refill at time of d/c.  Pt has checked her glucose everyday and watched what she ate. Pt to continue to work on glucose control. Discussed a goal Glucose average of 150 all the time.  D/C:  Needs metformin refill for home (was without it for 1 week up to admission)  Thanks,  Tama Headings RN, MSN, BC-ADM Inpatient Diabetes Coordinator Team Pager [phone] (8a-5p)

## 2020-11-10 NOTE — Progress Notes (Signed)
Physical Therapy Treatment Patient Details Name: NOON SIPP MRN: [id number] DOB: 1955/11/02 Today's Date: 11/10/2020    History of Present Illness Pt is a 65 y.o. female who presented 7/9 with R gaze preference, slurred speech, and L-sided weakness. tPA was administered. 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 CTA head and neck b/l siphon severe stenosis, left ICA bulb 70% and right ICA bulb 50-60% stenosis. 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 PMH: HTN, HLD, DM2, CAD/NSTEMI s/p stent, L hip arthritis, COPD, MI, PVD, CKD, CHF.    PT Comments    Pt progressing well towards her physical therapy goals; exhibits improved activity tolerance and ambulation distance. Pt ambulating x 180 feet with a  walker at a min guard assist level. Continues with gait abnormalities, decreased balance and cognitive deficits. D/c plan remains appropriate.     Follow Up Recommendations  Outpatient PT;Supervision for mobility/OOB     Equipment Recommendations  None recommended by PT    Recommendations for Other Services       Precautions / Restrictions Precautions Precautions: Fall Restrictions Weight Bearing Restrictions: No    Mobility  Bed Mobility Overal bed mobility: Modified Independent                  Transfers Overall transfer level: Needs assistance Equipment used: Rolling walker (2 wheeled);Straight cane Transfers: Sit to/from Stand Sit to Stand: Supervision         General transfer comment: supervision for safety  Ambulation/Gait Ambulation/Gait assistance: Min guard Gait Distance (Feet): 180 Feet Assistive device: Rolling walker (2 wheeled);Straight cane Gait Pattern/deviations: Step-through pattern;Decreased step length - left;Decreased stride length;Antalgic;Drifts right/left;Trunk flexed Gait velocity: decreased   General Gait Details: cues for walker proximity, upright posture and gaze   Stairs              Wheelchair Mobility    Modified Rankin (Stroke Patients Only) Modified Rankin (Stroke Patients Only) Pre-Morbid Rankin Score: No significant disability Modified Rankin: Moderately severe disability     Balance Overall balance assessment: Needs assistance Sitting-balance support: No upper extremity supported;Feet supported Sitting balance-Leahy Scale: Good     Standing balance support: Bilateral upper extremity supported;During functional activity Standing balance-Leahy Scale: Poor Standing balance comment: Reliant on UE support with mobility.                            Cognition Arousal/Alertness: Awake/alert Behavior During Therapy: WFL for tasks assessed/performed Overall Cognitive Status: Impaired/Different from baseline Area of Impairment: Memory;Problem solving;Attention;Safety/judgement;Awareness                   Current Attention Level: Selective Memory: Decreased short-term memory   Safety/Judgement: Decreased awareness of safety;Decreased awareness of deficits Awareness: Emergent Problem Solving: Slow processing General Comments: Not oriented to day of week, decreased STM, often repeating things during session.      Exercises      General Comments        Pertinent Vitals/Pain Pain Assessment: Faces Faces Pain Scale: Hurts a little bit Pain Location: R hip (chronic) Pain Descriptors / Indicators: Discomfort;Guarding Pain Intervention(s): Monitored during session    Home Living                      Prior Function            PT Goals (current goals can now be found in the  care plan section) Acute Rehab PT Goals Patient Stated Goal: to go home with her daughter Potential to Achieve Goals: Good Progress towards PT goals: Progressing toward goals    Frequency    Min 4X/week      PT Plan Current plan remains appropriate    Co-evaluation              AM-PAC PT "6 Clicks" Mobility   Outcome  Measure  Help needed turning from your back to your side while in a flat bed without using bedrails?: None Help needed moving from lying on your back to sitting on the side of a flat bed without using bedrails?: A Little Help needed moving to and from a bed to a chair (including a wheelchair)?: A Little Help needed standing up from a chair using your arms (e.g., wheelchair or bedside chair)?: A Little Help needed to walk in hospital room?: A Little Help needed climbing 3-5 steps with a railing? : A Little 6 Click Score: 19    End of Session Equipment Utilized During Treatment: Gait belt Activity Tolerance: Patient tolerated treatment well Patient left: in chair;with call bell/phone within reach;with chair alarm set Nurse Communication: Mobility status PT Visit Diagnosis: Unsteadiness on feet (R26.81);Other abnormalities of gait and mobility (R26.89);Muscle weakness (generalized) (M62.81);Difficulty in walking, not elsewhere classified (R26.2);Other symptoms and signs involving the nervous system (R29.898)     Time: [id number] PT Time Calculation (min) (ACUTE ONLY): 19 min  Charges:  $Gait Training: 8-22 mins                     Wyona Almas, PT, DPT Acute Rehabilitation Services Pager [phone] Office [phone]    Deno Etienne 11/10/2020, 12:54 PM

## 2020-11-10 NOTE — TOC Initial Note (Signed)
Transition of Care [facility]) - Initial/Assessment Note    Patient Details  Name: [name] MRN: [id number] Date of Birth: April 19, 1956  [facility] CM/SW Contact:    Ella Bodo, RN Phone Number: 11/10/2020, 5:05 PM  Clinical Narrative:                 Pt is a 65 y.o. female who presented 7/9 with R gaze preference, slurred speech, and L-sided weakness. TPA was administered. 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 CTA head and neck b/l siphon severe stenosis, left ICA bulb 70% and right ICA bulb 50-60% stenosis. 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 Prior to admission, patient independent and living at home with spouse.  PT/OT/ST recommending outpatient therapies, and referrals made to [facility].  Will arrange PCP follow-up for 1 to 2 weeks post discharge.  Expected Discharge Plan: OP Rehab Barriers to Discharge: Continued Medical Work up          Expected Discharge Plan and Services Expected Discharge Plan: OP Rehab   Discharge Planning Services: CM Consult   Living arrangements for the past 2 months: Single Family Home                                      Prior Living Arrangements/Services Living arrangements for the past 2 months: Single Family Home Lives with:: Spouse Patient language and need for interpreter reviewed:: Yes Do you feel safe going back to the place where you live?: Yes      Need for Family Participation in Patient Care: Yes (Comment) Care giver support system in place?: Yes (comment)   Criminal Activity/Legal Involvement Pertinent to Current Situation/Hospitalization: No - Comment as needed  Activities of Daily Living Home Assistive Devices/Equipment: Eyeglasses, Cane (specify quad or straight) (straight cane) ADL Screening (condition at time of admission) Patient's cognitive ability adequate to safely complete daily activities?: Yes Is the patient  deaf or have difficulty hearing?: No Does the patient have difficulty seeing, even when wearing glasses/contacts?: No Does the patient have difficulty concentrating, remembering, or making decisions?: Yes Patient able to express need for assistance with ADLs?: No Does the patient have difficulty dressing or bathing?: No Independently performs ADLs?: Yes (appropriate for developmental age) Does the patient have difficulty walking or climbing stairs?: No Weakness of Legs: None Weakness of Arms/Hands: None  Permission Sought/Granted                  Emotional Assessment Appearance:: Appears stated age Attitude/Demeanor/Rapport: Engaged Affect (typically observed): Accepting Orientation: : Oriented to Self, Oriented to Place, Oriented to  Time, Oriented to Situation      Admission diagnosis:  Stroke (cerebrum) (HCC) [I63.9] Cerebrovascular accident (CVA), unspecified mechanism ([place]) [I63.9] Patient Active Problem List   Diagnosis Date Noted   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/07/2020   Hyperglycemia    NSTEMI (non-ST elevated myocardial infarction) ([place]) 06/06/2020   Musculoskeletal neck pain 02/17/2020   Incomplete uterine prolapse 11/23/2018   GERD (gastroesophageal reflux disease) 11/14/2018   Mild depression ([place]) 04/11/2018   Nonischemic cardiomyopathy ([place]) 08/31/2017   Acute respiratory failure with hypoxia ([place]) 06/02/2017   Pain in toes of both feet 01/26/2017   Bilateral carotid artery disease ([place]) 11/10/2016   Seasonal allergies 06/30/2016   Health care maintenance 06/16/2015   Nonproliferative diabetic retinopathy associated  with type 2 diabetes mellitus ([place]) 06/15/2015   Osteoarthritis of right hip 06/09/2015   Type II diabetes mellitus with stage 3 chronic kidney disease ([place]) 07/17/2013   Chronic combined systolic and diastolic CHF, NYHA class 2 ([place]) 07/07/2013   CKD (chronic kidney disease) stage 3, GFR 30-59 ml/min ([place]) 11/22/2012   HTN (hypertension)  11/08/2012   PCP:  Idamae Schuller, MD Pharmacy:   [facility] [address] [place] [address] Phone: [phone] Fax: [phone]     Social Determinants of Health (SDOH) Interventions    Readmission Risk Interventions Readmission Risk Prevention Plan 08/03/2020 06/12/2020  Transportation Screening Complete Complete  PCP or Specialist Appt within 5-7 Days - Complete  PCP or Specialist Appt within 3-5 Days Complete -  Home Care Screening - Complete  Medication Review (RN CM) - Complete  HRI or Home Care Consult Complete -  Social Work Consult for [place] Planning/Counseling Complete -  Palliative Care Screening Not Applicable -  Medication Review (RN Care Manager) Complete -  Some recent data might be hidden   Reinaldo Raddle, RN, BSN  Trauma/Neuro ICU Case Manager [phone]

## 2020-11-10 NOTE — Discharge Summary (Addendum)
Stroke Discharge Summary  Patient ID: [name]   MRN: [id number]      DOB: 12-01-1955  Date of Admission: 11/07/2020 Date of Discharge: 11/10/2020  Attending Physician:  Stroke, Md, MD, Stroke MD Consultant(s):   Treatment Team:  Elam Dutch, MD vascular surgery   Patient's PCP:  Idamae Schuller, MD  DISCHARGE DIAGNOSIS:  Active Problems:   Stroke (cerebrum) ([place])   HTN   CHF   Symptomatic right carotid stenosis   DM   HLD   CKD 3b    Allergies as of 11/10/2020       Reactions   Lisinopril Cough   Plavix [clopidogrel Bisulfate] Rash        Medication List     STOP taking these medications    Accu-Chek FastClix Lancets Misc   Accu-Chek Guide w/Device Kit   blood glucose meter kit and supplies Kit   Insupen Pen Needles 32G X 4 MM Misc Generic drug: Insulin Pen Needle   Jardiance 25 MG Tabs tablet Generic drug: empagliflozin   Unifine Pentips 31G X 5 MM Misc Generic drug: Insulin Pen Needle       TAKE these medications    acetaminophen 500 MG tablet Commonly known as: TYLENOL Take 1,000 mg by mouth every 6 (six) hours as needed for mild pain, fever or headache.   albuterol 108 (90 Base) MCG/ACT inhaler Commonly known as: VENTOLIN HFA INHALE 2 PUFFS INTO THE LUNGS EVERY 6 (SIX) HOURS AS NEEDED FOR WHEEZING OR SHORTNESS OF BREATH.   aspirin 81 MG EC tablet Take 1 tablet (81 mg total) by mouth daily. Swallow whole. Start taking on: November 11, 2020 What changed: additional instructions   atorvastatin 80 MG tablet Commonly known as: LIPITOR Take 1 tablet (80 mg total) by mouth daily. Start taking on: November 11, 2020   carvedilol 12.5 MG tablet Commonly known as: COREG Take 1 tablet (12.5 mg total) by mouth 2 (two) times daily with a meal.   Entresto 49-51 MG Generic drug: sacubitril-valsartan TAKE 1 TABLET BY MOUTH 2 (TWO) TIMES DAILY What changed: when to take this   furosemide 20 MG tablet Commonly known as: LASIX Take 20 mg by  mouth as needed for fluid or edema.   isosorbide mononitrate 120 MG 24 hr tablet Commonly known as: IMDUR Take 1 tablet (120 mg total) by mouth daily.   metFORMIN 500 MG 24 hr tablet Commonly known as: GLUCOPHAGE-XR Take 2 tablets (1,000 mg total) by mouth in the morning and at bedtime.   Multivitamin Gummies Adult Chew Chew 2 tablets by mouth daily.   nitroGLYCERIN 0.4 MG SL tablet Commonly known as: NITROSTAT Place 1 tablet (0.4 mg total) under the tongue every 5 (five) minutes as needed for chest pain. What changed: Another medication with the same name was removed. Continue taking this medication, and follow the directions you see here.   omeprazole 20 MG capsule Commonly known as: PRILOSEC Take 1 capsule (20 mg total) by mouth daily as needed ([place]). What changed:  when to take this reasons to take this Another medication with the same name was removed. Continue taking this medication, and follow the directions you see here.   spironolactone 25 MG tablet Commonly known as: ALDACTONE Take 0.5 tablets (12.5 mg total) by mouth daily. What changed: Another medication with the same name was removed. Continue taking this medication, and follow the directions you see here.   ticagrelor 90 MG Tabs tablet Commonly known as:  BRILINTA Take 1 tablet (90 mg total) by mouth 2 (two) times daily.   tiZANidine 4 MG tablet Commonly known as: Zanaflex Take 1 tablet (4 mg total) by mouth every 6 (six) hours as needed for muscle spasms.   traMADol 50 MG tablet Commonly known as: Ultram Take 1 tablet (50 mg total) by mouth daily as needed for severe pain.        LABORATORY STUDIES CBC    Component Value Date/Time   WBC 6.1 11/10/2020 0336   RBC 3.66 (L) 11/10/2020 0336   HGB 10.6 (L) 11/10/2020 0336   HGB 11.6 11/14/2018 1120   HCT 32.5 (L) 11/10/2020 0336   HCT 35.3 11/14/2018 1120   PLT 216 11/10/2020 0336   PLT 218 11/14/2018 1120   MCV 88.8 11/10/2020 0336   MCV 91  11/14/2018 1120   MCH 29.0 11/10/2020 0336   MCHC 32.6 11/10/2020 0336   RDW 15.1 11/10/2020 0336   RDW 14.5 11/14/2018 1120   LYMPHSABS 1.4 11/07/2020 1641   MONOABS 0.4 11/07/2020 1641   EOSABS 0.0 11/07/2020 1641   BASOSABS 0.0 11/07/2020 1641   CMP    Component Value Date/Time   NA 136 11/10/2020 0336   NA 139 09/25/2019 1454   K 3.9 11/10/2020 0336   CL 106 11/10/2020 0336   CO2 20 (L) 11/10/2020 0336   GLUCOSE 164 (H) 11/10/2020 0336   BUN 27 (H) 11/10/2020 0336   BUN 35 (H) 09/25/2019 1454   CREATININE 1.85 (H) 11/10/2020 0336   CREATININE 3.32 (H) 07/17/2013 1717   CALCIUM 9.1 11/10/2020 0336   PROT 6.3 (L) 11/07/2020 1641   PROT 7.1 09/25/2019 1454   ALBUMIN 2.8 (L) 11/07/2020 1641   ALBUMIN 3.9 09/25/2019 1454   AST 25 11/07/2020 1641   ALT 15 11/07/2020 1641   ALKPHOS 90 11/07/2020 1641   BILITOT 0.5 11/07/2020 1641   BILITOT 0.2 09/25/2019 1454   GFRNONAA 30 (L) 11/10/2020 0336   GFRNONAA 42 (L) 11/22/2012 1342   GFRAA 39 (L) 09/25/2019 1454   GFRAA 49 (L) 11/22/2012 1342   COAGS Lab Results  Component Value Date   INR 1.3 (H) 11/07/2020   INR 0.9 08/02/2020   INR 1.0 06/08/2020   Lipid Panel    Component Value Date/Time   CHOL 222 (H) 11/07/2020 2350   CHOL 149 08/31/2017 0941   TRIG 75 11/07/2020 2350   HDL 51 11/07/2020 2350   HDL 60 08/31/2017 0941   CHOLHDL 4.4 11/07/2020 2350   VLDL 15 11/07/2020 2350   LDLCALC 156 (H) 11/07/2020 2350   LDLCALC 74 08/31/2017 0941   HgbA1C  Lab Results  Component Value Date   HGBA1C 9.2 (H) 11/07/2020   Urinalysis    Component Value Date/Time   COLORURINE YELLOW 06/07/2020 0820   APPEARANCEUR HAZY (A) 06/07/2020 0820   LABSPEC 1.020 06/07/2020 0820   PHURINE 5.0 06/07/2020 0820   GLUCOSEU >=500 (A) 06/07/2020 0820   HGBUR MODERATE (A) 06/07/2020 0820   BILIRUBINUR NEGATIVE 06/07/2020 0820   KETONESUR 5 (A) 06/07/2020 0820   PROTEINUR NEGATIVE 06/07/2020 0820   UROBILINOGEN 0.2 08/13/2013 1556    NITRITE NEGATIVE 06/07/2020 0820   LEUKOCYTESUR LARGE (A) 06/07/2020 0820   Urine Drug Screen     Component Value Date/Time   LABOPIA NONE DETECTED 11/08/2020 0840   COCAINSCRNUR NONE DETECTED 11/08/2020 0840   LABBENZ NONE DETECTED 11/08/2020 0840   AMPHETMU NONE DETECTED 11/08/2020 0840   THCU POSITIVE (A) 11/08/2020 0840  LABBARB NONE DETECTED 11/08/2020 0840    Alcohol Level No results found for: ETH   SIGNIFICANT DIAGNOSTIC STUDIES MR BRAIN WO CONTRAST  Result Date: 11/08/2020 CLINICAL DATA:  Stroke, follow-up status post tPA EXAM: MRI HEAD WITHOUT CONTRAST TECHNIQUE: Multiplanar, multiecho pulse sequences of the brain and surrounding structures were obtained without intravenous contrast. COMPARISON:  CT exams from yesterday. FINDINGS: Brain: Acute infarct in the right frontal lobe, operculum, and anterior insula.Associated edema without significant mass effect. No evidence of acute mass occupying hemorrhagic transformation. There are numerous small chronic microhemorrhages in bilateral basal ganglia and thalami and the brainstem, as well as a few in the supratentorial white matter. Remote lacunar infarcts in the left corona radiata, right basal ganglia, right thalamus, midbrain and pons. No mass lesion or extra-axial fluid collection. No midline shift. Basal cisterns are patent. Vascular: Major arterial flow voids are maintained at the skull base. Skull and upper cervical spine: Normal marrow signal. Sinuses/Orbits: Moderate ethmoid, maxillary, and right sphenoid sinus mucosal thickening. Other: No sizable mastoid effusions. IMPRESSION: 1. Acute infarct in the right frontal lobe, operculum, and anterior insula. Associated edema without significant mass effect. Remote lacunar infarcts in the left corona radiata, right basal ganglia, right thalamus, midbrain and pons. 2. Numerous prior microhemorrhages that are predominantly in the basal ganglia/thalami and brainstem, most likely  secondary to hypertension. 3. Ventriculomegaly, which is mildly out of proportion to the degree of sulcal volume loss. Additionally, the callosal angle is somewhat acute and there is crowding of sulci at the vertex. Findings could be secondary to normal pressure hydrocephalus or central predominant atrophy with ex vacuo ventricular dilation. 4. Moderate paranasal sinus mucosal thickening. Electronically Signed   By: Margaretha Sheffield MD   On: 11/08/2020 15:30   ECHOCARDIOGRAM COMPLETE  Result Date: 11/08/2020    ECHOCARDIOGRAM REPORT   Patient Name:   [name] Date of Exam: 11/08/2020 Medical Rec #:  [id number]      Height:       63.0 in Accession #:    [id number]     Weight:       160.7 lb Date of Birth:  Jun 08, 1955     BSA:          1.762 m Patient Age:    65 years       BP:           149/82 mmHg Patient Gender: F              HR:           79 bpm. Exam Location:  Inpatient Procedure: 2D Echo, 3D Echo, Cardiac Doppler, Color Doppler and Intracardiac            Opacification Agent Indications:    Stroke  History:        Patient has prior history of Echocardiogram examinations, most                 recent 06/06/2020. CHF, CAD and Previous Myocardial Infarction,                 COPD; Risk Factors:Hypertension, Dyslipidemia and Diabetes.  Sonographer:    Clayton Lefort RDCS (AE) Referring Phys: [place]  1. Left ventricular ejection fraction, by estimation, is 45 to 50%. The left ventricle has mildly decreased function. The left ventricle demonstrates global hypokinesis. There is moderate concentric left ventricular hypertrophy. Left ventricular diastolic parameters are consistent with Grade II diastolic dysfunction (pseudonormalization). Elevated left atrial pressure.  There is very mild hypokinesis of the left ventricular, basal-mid inferior wall and inferoseptal wall. There is profound LBBB-related septal-lateral wall dyssynchrony that accounts for most of the decrease in LVEF.  2.  Right ventricular systolic function is normal. The right ventricular size is normal. There is mildly elevated pulmonary artery systolic pressure.  3. Left atrial size was mild to moderately dilated.  4. The mitral valve is degenerative. Mild mitral valve regurgitation. No evidence of mitral stenosis. Moderate to severe mitral annular calcification.  5. The aortic valve is tricuspid. There is moderate calcification of the aortic valve. There is moderate thickening of the aortic valve. Aortic valve regurgitation is mild. Mild to moderate aortic valve sclerosis/calcification is present, without any evidence of aortic stenosis.  6. The inferior vena cava is normal in size with greater than 50% respiratory variability, suggesting right atrial pressure of 3 mmHg. Comparison(s): Prior images reviewed side by side. The left ventricular function has improved. The left ventricular wall motion has improved. FINDINGS  Left Ventricle: There is profound LBBB-related septal-lateral wall dyssynchrony that accounts for most of the decrease in LVEF. Left ventricular ejection fraction, by estimation, is 45 to 50%. The left ventricle has mildly decreased function. The left ventricle demonstrates global hypokinesis. Mild hypokinesis of the left ventricular, basal-mid inferior wall and inferoseptal wall. Definity contrast agent was given IV to delineate the left ventricular endocardial borders. 3D left ventricular ejection fraction analysis performed but not reported based on interpreter judgement due to suboptimal quality. The left ventricular internal cavity size was normal in size. There is moderate concentric left ventricular hypertrophy. Abnormal (paradoxical) septal motion, consistent with left bundle branch block. Left ventricular diastolic parameters are consistent with Grade II diastolic dysfunction (pseudonormalization). Elevated left atrial pressure. Right Ventricle: The right ventricular size is normal. No increase in right  ventricular wall thickness. Right ventricular systolic function is normal. There is mildly elevated pulmonary artery systolic pressure. The tricuspid regurgitant velocity is 2.76  m/s, and with an assumed right atrial pressure of 3 mmHg, the estimated right ventricular systolic pressure is 40.9 mmHg. Left Atrium: Left atrial size was mild to moderately dilated. Right Atrium: Right atrial size was normal in size. Pericardium: There is no evidence of pericardial effusion. Mitral Valve: The mitral valve is degenerative in appearance. Moderate to severe mitral annular calcification. Mild mitral valve regurgitation. No evidence of mitral valve stenosis. MV peak gradient, 5.4 mmHg. The mean mitral valve gradient is 3.0 mmHg. Tricuspid Valve: The tricuspid valve is normal in structure. Tricuspid valve regurgitation is not demonstrated. Aortic Valve: There is a broad-based fixed calcified echodensity on the tip of the noncoronary cusp 4x7 mm in size. It probably represents degenerative change, but may be a healed vegetation. The aortic valve is tricuspid. There is moderate calcification  of the aortic valve. There is moderate thickening of the aortic valve. Aortic valve regurgitation is mild. Aortic regurgitation PHT measures 373 msec. Mild to moderate aortic valve sclerosis/calcification is present, without any evidence of aortic stenosis. Aortic valve mean gradient measures 4.0 mmHg. Aortic valve peak gradient measures 8.3 mmHg. Aortic valve area, by VTI measures 1.79 cm. Pulmonic Valve: The pulmonic valve was normal in structure. Pulmonic valve regurgitation is not visualized. Aorta: The aortic root and ascending aorta are structurally normal, with no evidence of dilitation. Venous: The inferior vena cava is normal in size with greater than 50% respiratory variability, suggesting right atrial pressure of 3 mmHg. IAS/Shunts: No atrial level shunt detected by color flow Doppler.  LEFT VENTRICLE PLAX 2D LVIDd:         4.20  cm      Diastology LVIDs:         3.60 cm      LV e' medial:    3.45 cm/s LV PW:         1.50 cm      LV E/e' medial:  34.2 LV IVS:        1.70 cm      LV e' lateral:   6.83 cm/s LVOT diam:     1.90 cm      LV E/e' lateral: 17.3 LV SV:         52 LV SV Index:   30 LVOT Area:     2.84 cm                              3D Volume EF: LV Volumes (MOD)            3D EF:        36 % LV vol d, MOD A2C: 98.1 ml  LV EDV:       125 ml LV vol d, MOD A4C: 110.0 ml LV ESV:       80 ml LV vol s, MOD A2C: 54.0 ml  LV SV:        45 ml LV vol s, MOD A4C: 69.0 ml LV SV MOD A2C:     44.1 ml LV SV MOD A4C:     110.0 ml LV SV MOD BP:      46.5 ml RIGHT VENTRICLE             IVC RV Basal diam:  2.80 cm     IVC diam: 1.80 cm RV S prime:     10.40 cm/s TAPSE (M-mode): 2.0 cm LEFT ATRIUM           Index       RIGHT ATRIUM           Index LA diam:      4.10 cm 2.33 cm/m  RA Area:     14.00 cm LA Vol (A4C): 65.0 ml 36.89 ml/m RA Volume:   32.20 ml  18.27 ml/m  AORTIC VALVE AV Area (Vmax):    1.89 cm AV Area (Vmean):   1.56 cm AV Area (VTI):     1.79 cm AV Vmax:           144.00 cm/s AV Vmean:          97.500 cm/s AV VTI:            0.291 m AV Peak Grad:      8.3 mmHg AV Mean Grad:      4.0 mmHg LVOT Vmax:         95.90 cm/s LVOT Vmean:        53.500 cm/s LVOT VTI:          0.184 m LVOT/AV VTI ratio: 0.63 AI PHT:            373 msec  AORTA Ao Root diam: 2.70 cm Ao Asc diam:  3.30 cm MITRAL VALVE                TRICUSPID VALVE MV Area (PHT): 5.27 cm     TR Peak grad:   30.5 mmHg MV Peak grad:  5.4 mmHg     TR Vmax:  276.00 cm/s MV Mean grad:  3.0 mmHg MV Vmax:       1.16 m/s     SHUNTS MV Vmean:      87.7 cm/s    Systemic VTI:  0.18 m MV Decel Time: 144 msec     Systemic Diam: 1.90 cm MV E velocity: 118.00 cm/s MV A velocity: 103.00 cm/s MV E/A ratio:  1.15 Mihai Croitoru MD Electronically signed by Sanda Klein MD Signature Date/Time: 11/08/2020/5:09:44 PM    Final    CT HEAD CODE STROKE WO CONTRAST  Result Date:  11/07/2020 CLINICAL DATA:  Code stroke.  Neuro deficit, acute stroke suspected. EXAM: CT HEAD WITHOUT CONTRAST TECHNIQUE: Contiguous axial images were obtained from the base of the skull through the vertex without intravenous contrast. COMPARISON:  October 03, 2007. FINDINGS: Brain: Remote lacunar infarct in the right thalamus and right basal ganglia, present on the prior CT head. No evidence of acute large vascular territory infarct or acute hemorrhage. Progressive patchy white matter hypoattenuation, most likely related to chronic microvascular ischemic disease. Progressive ventriculomegaly, which is mildly out of proportion to the degree of sulcal volume loss. Additionally, the callosum angle is somewhat acute and there is crowding of sulci at the vertex Vascular: No hyperdense vessel identified. Calcific atherosclerosis. Skull: No acute fracture. Sinuses/Orbits: Moderate mucosal thickening. No acute orbital findings. Other: No mastoid effusions. ASPECTS [facility] Stroke Program Early CT Score) total score (0-10 with 10 being normal): 10. IMPRESSION: 1. No evidence of acute large vascular territory infarct or acute hemorrhage. ASPECTS is 10 2. Progressive white matter hypoattenuation, most likely related to chronic microvascular ischemic disease. An MRI could provide more sensitive evaluation for acute white matter infarct if clinically indicated. 3. Remote infarcts in the right thalamus, right basal ganglia. 4. Progressive ventriculomegaly, which is mildly out of proportion to the degree of sulcal volume loss. Additionally, the callosal angle is somewhat acute and there is crowding of sulci at the vertex. Consider normal pressure hydrocephalus. Central predominant atrophy with ex vacuo ventricular dilation is a differential consideration. Findings discussed with Dr. Leonel Ramsay at 5:06 p.m. 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 This addendum was discussed with Dr. Lorrin Goodell via telephone at 7:40 p.m. Electronically Signed   By: Margaretha Sheffield MD   On: 11/07/2020 19:40   Result Date: 11/07/2020 CLINICAL DATA:  Stroke/TIA. EXAM: CT HEAD WITHOUT CONTRAST CT ANGIOGRAPHY OF THE HEAD AND NECK TECHNIQUE: Contiguous axial images were obtained from the base of the skull through the vertex without intravenous contrast. Multidetector CT imaging of the head and neck was performed using the standard protocol during bolus administration of intravenous contrast. Multiplanar CT image reconstructions and MIPs were obtained to evaluate the vascular anatomy. Carotid stenosis measurements (when applicable) are obtained utilizing NASCET criteria, using the distal internal carotid diameter as the denominator. CONTRAST:  76m OMNIPAQUE IOHEXOL 350 MG/ML SOLN COMPARISON:  Same day CT head. FINDINGS: CTA NECK Aortic arch: Great vessel origins are patent. Right carotid system: Calcific and noncalcific atherosclerosis at the carotid bifurcation with 50-60% stenosis of the ICA origin. Left carotid system: Calcific and noncalcific atherosclerosis involving the carotid bifurcation and proximal ICA with approximately 70%. Vertebral arteries:Mildly right dominant. There is moderate stenosis of  the right vertebral artery at the C6-C7 level due to mass effect from an adjacent osteophyte Skeleton: Moderate to severe multilevel degenerative change. Other neck: No acute abnormality.  Visualized lung apices are clear. CTA HEAD Anterior circulation: Extensive calcific atherosclerosis of bilateral cavernous and paraclinoid ICAs with severe right and moderate left cavernous and paraclinoid ICA stenosis. Bilateral MCAs are patent. Bilateral ACAs are patent. No aneurysm identified. Posterior  circulation: Bilateral intradural vertebral arteries are patent. Mild-to-moderate mid basilar artery stenosis. Bilateral PCAs are patent. Mild multifocal stenosis of bilateral PCAs. No aneurysm identified. Venous sinuses: Within limitation non arterial timing, no evidence of dural venous sinus thrombosis. IMPRESSION: CTA head: 1. No large vessel occlusion. 2. 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 3. Mild to moderate stenosis of the mid basilar artery and mild bilateral PCA stenosis. CTA Neck: 1. Approximately 70% stenosis of the proximal left ICA. 2. Approximately 50-60% stenosis of the right ICA origin 3. 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 This addendum was discussed with Dr. Lorrin Goodell via telephone at 7:40 p.m. Electronically Signed   By: Margaretha Sheffield MD   On: 11/07/2020 19:40   Result Date: 11/07/2020 CLINICAL DATA:  Stroke/TIA. EXAM: CT HEAD WITHOUT CONTRAST CT ANGIOGRAPHY OF THE HEAD AND NECK TECHNIQUE: Contiguous axial images were obtained from the base of the skull through the vertex without intravenous contrast. Multidetector CT imaging of the head and neck was performed using the standard protocol during bolus administration of intravenous contrast. Multiplanar CT image reconstructions and MIPs were obtained to evaluate the vascular anatomy. Carotid stenosis measurements (when applicable) are obtained utilizing NASCET criteria, using the distal internal carotid diameter as the denominator. CONTRAST:  44m OMNIPAQUE IOHEXOL 350 MG/ML SOLN COMPARISON:  Same day CT head. FINDINGS: CTA NECK  Aortic arch: Great vessel origins are patent. Right carotid system: Calcific and noncalcific atherosclerosis at the carotid bifurcation with 50-60% stenosis of the ICA origin. Left carotid system: Calcific and noncalcific atherosclerosis involving the carotid bifurcation and proximal ICA with approximately 70%. Vertebral arteries:Mildly right dominant. There is moderate stenosis of the right vertebral artery at the C6-C7 level due to mass effect from an adjacent osteophyte Skeleton: Moderate to severe multilevel degenerative change. Other neck: No acute abnormality.  Visualized lung apices are clear. CTA HEAD Anterior circulation: Extensive calcific atherosclerosis of bilateral cavernous and paraclinoid ICAs with severe right and moderate left cavernous and paraclinoid ICA stenosis. Bilateral MCAs are patent. Bilateral ACAs are patent. No aneurysm identified. Posterior circulation: Bilateral intradural vertebral arteries are patent. Mild-to-moderate mid basilar artery stenosis. Bilateral PCAs are patent. Mild multifocal stenosis of bilateral PCAs. No aneurysm identified. Venous sinuses: Within limitation non arterial timing, no evidence of dural venous sinus thrombosis. IMPRESSION: CTA head: 1. No large vessel occlusion. 2. 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 3. Mild to moderate stenosis of the mid basilar artery and mild bilateral PCA stenosis. CTA Neck: 1. Approximately 70% stenosis of the proximal left ICA. 2. Approximately 50-60% stenosis of the right ICA origin 3. Moderate stenosis of the right vertebral artery at the C6-C7 level due to mass effect from  an adjacent osteophyte Electronically Signed: By: Margaretha Sheffield MD On: 11/07/2020 18:09      HISTORY OF PRESENT ILLNESS  Ms. Latera Mclin is a 65 yo female with a PMHx of CHF, CKD III, COPD, HLD, HTN, CAD s/p NSTEMI, and DM II. Patient arrived in her own car and after triage, a code stroke was called by ED MD. When  neurology responded, patient was being brought into CT suite.   She reports that when going to get her car serviced and daughter was following her. Patient stopped at ATM and daughter noticed patient was having difficulty using the ATM. Daughter went to check on patient and found her with slurred speech and a facial droop. Patient with right gaze preference on arrival. Followed commands. NIHSS initially was  3. She was treated with IV tPA. No LVO noted.  HOSPITAL COURSE Ms. BRITNIE COLVILLE is a 65 y.o. female with history of HTN, HLD, DM, CAD/NSTEMI s/p stent, CKD, CHF admitted for left facial droop, slurry speech and right gaze preference. tPA was given.  Stroke: Rt MCA stroke, etiology could be d/t RICA stenosis vs. CHF. Vascular Sx was consulted and they plan for close out pt f/u in 4 weeks for possible TCAR vs CEA. Resultant mild left facial droop CT no acute finding but old right thalamic and BG lacunes CTA head and neck b/l siphon severe stenosis, left ICA bulb 70% and right ICA bulb 50-60% stenosis MRI Acute infarct in the right frontal lobe, operculum, and anterior insula. Associated edema without significant mass effect. Remote lacunar infarcts in the left corona radiata, right basal ganglia, right thalamus, midbrain and pons. 2D Echo  45% EF, LV has global hypokinesis, mod LVH. LA dilation CUS bilateral ICA 40 to 59% stenosis LDL 156 HgbA1c 9.2 UDS + for THC SCDs for VTE prophylaxis aspirin 81 mg daily and Brilinta (ticagrelor) 90 mg bid prior to admission, now restarted on aspirin and Brilinta.  Patient counseled to be compliant with her antithrombotic medications Ongoing aggressive stroke risk factor management Therapy recommendations: Outpatient PT/OT Disposition:  pending   Cerebrovascular stenosis CTA head and neck b/l siphon severe stenosis, left ICA bulb 70% and right ICA bulb 50-60% stenosis CUS: Bilateral ICA 40 to 59% stenosis Vascular surgery consulted for right ICA  stenosis and plan made for out pt close f/u in 4 weeks for consideration of CEA/TCAR.   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  Patient has no symptoms of cognitive impairment, urine incontinence or gait difficulty, low suspicion for NPH at this time clinically.  We will continue outpatient follow-up.     DISCHARGE EXAM Blood pressure 130/78, pulse 76, temperature 98.7 F (37.1 C), temperature source Oral, resp. rate (!) 21, height '5\' 3"'  (1.6 m), weight 72.9 kg, SpO2 95 %. General - Well nourished, well developed,  in no apparent distress.   Ophthalmologic - fundi not visualized due to noncooperation.   Cardiovascular - Regular rhythm and rate.   Mental Status - A&Ox4. Language including expression, naming, repetition, comprehension was assessed and found intact. Fund of Knowledge was assessed and was intact.   Cranial Nerves II - XII - II - Visual field intact III, IV, VI - Extraocular movements intact. V - Facial sensation intact bilaterally. VII - mild left flattening of naso-labial fold. VIII - Hearing & vestibular intact bilaterally. X - Palate elevates symmetrically. XI - Chin turning & shoulder shrug intact bilaterally. XII - Tongue protrusion intact.   Motor Strength - The patient's strength was  normal in all extremities and pronator drift was absent.  Bulk was normal and fasciculations were absent.   Motor Tone - Muscle tone was assessed at the neck and appendages and was normal.   Reflexes - The patient's reflexes were symmetrical in all extremities and she had no pathological reflexes.   Sensory - Intact to light touch and symmetrical.     Coordination - The patient had normal movements in the hands and feet with no ataxia or dysmetria.  Tremor was absent.   Gait and Station - stable  Discharge Diet       Diet   Diet heart healthy/carb modified Room service appropriate? Yes; Fluid consistency: Thin   liquids  DISCHARGE PLAN Disposition:  home aspirin 81 mg daily and Brilinta (ticagrelor) 90 mg bid for secondary stroke prevention; only change this if your Vascular surgeon advises [facility] in 4-6 weeks to plan for possible surgery on the right carotid artery Ongoing stroke risk factor control by Primary Care Physician at time of discharge Follow-up PCP Idamae Schuller, MD in 2 weeks. Follow-up in [facility] in 4 weeks, office to schedule an appointment.   45 minutes were spent preparing discharge.  Desiree Metzger-Cihelka, ARNP-C, ANVP-BC Pager: [phone]    ATTENDING NOTE: I reviewed above note and agree with the assessment and plan. Pt was seen and examined.   No acute event overnight, neuro stable.  Had carotid Doppler showed bilateral ICA 40 to 59% stenosis.  Vascular surgery consulted and will follow-up as outpatient.  Patient stroke work-up finished, continue aspirin and Brilinta and Lipitor.  Follow-up with neurology stroke clinic at [facility] in 4 weeks.  For detailed assessment and plan, please refer to above as I have made changes wherever appropriate.   Rosalin Hawking, MD PhD Stroke Neurology 11/11/2020 6:54 PM
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
## 2020-11-11 ENCOUNTER — Telehealth: Payer: Self-pay

## 2020-11-11 ENCOUNTER — Other Ambulatory Visit (HOSPITAL_COMMUNITY): Payer: Self-pay

## 2020-11-11 ENCOUNTER — Inpatient Hospital Stay (HOSPITAL_COMMUNITY): Payer: Medicaid Other

## 2020-11-11 DIAGNOSIS — E1165 Type 2 diabetes mellitus with hyperglycemia: Secondary | ICD-10-CM | POA: Diagnosis not present

## 2020-11-11 DIAGNOSIS — I6523 Occlusion and stenosis of bilateral carotid arteries: Secondary | ICD-10-CM | POA: Diagnosis not present

## 2020-11-11 DIAGNOSIS — I63412 Cerebral infarction due to embolism of left middle cerebral artery: Secondary | ICD-10-CM | POA: Diagnosis not present

## 2020-11-11 DIAGNOSIS — I5021 Acute systolic (congestive) heart failure: Secondary | ICD-10-CM | POA: Diagnosis not present

## 2020-11-11 DIAGNOSIS — I255 Ischemic cardiomyopathy: Secondary | ICD-10-CM

## 2020-11-11 DIAGNOSIS — N1832 Chronic kidney disease, stage 3b: Secondary | ICD-10-CM | POA: Diagnosis not present

## 2020-11-11 DIAGNOSIS — I10 Essential (primary) hypertension: Secondary | ICD-10-CM | POA: Diagnosis not present

## 2020-11-11 DIAGNOSIS — E7800 Pure hypercholesterolemia, unspecified: Secondary | ICD-10-CM | POA: Diagnosis not present

## 2020-11-11 DIAGNOSIS — I639 Cerebral infarction, unspecified: Secondary | ICD-10-CM | POA: Diagnosis not present

## 2020-11-11 LAB — BASIC METABOLIC PANEL
Anion gap: 7 (ref 5–15)
BUN: 32 mg/dL — ABNORMAL HIGH (ref 8–23)
CO2: 22 mmol/L (ref 22–32)
Calcium: 9.1 mg/dL (ref 8.9–10.3)
Chloride: 108 mmol/L (ref 98–111)
Creatinine, Ser: 1.96 mg/dL — ABNORMAL HIGH (ref 0.44–1.00)
GFR, Estimated: 28 mL/min — ABNORMAL LOW (ref >60)
Glucose, Bld: 67 mg/dL — ABNORMAL LOW (ref 70–99)
Potassium: 4.2 mmol/L (ref 3.5–5.1)
Sodium: 137 mmol/L (ref 135–145)

## 2020-11-11 LAB — CBC
HCT: 33 % — ABNORMAL LOW (ref 36.0–46.0)
Hemoglobin: 10.6 g/dL — ABNORMAL LOW (ref 12.0–15.0)
MCH: 28.9 pg (ref 26.0–34.0)
MCHC: 32.1 g/dL (ref 30.0–36.0)
MCV: 89.9 fL (ref 80.0–100.0)
Platelets: 229 10*3/uL (ref 150–400)
RBC: 3.67 MIL/uL — ABNORMAL LOW (ref 3.87–5.11)
RDW: 15.1 % (ref 11.5–15.5)
WBC: 6.1 10*3/uL (ref 4.0–10.5)
nRBC: 0 % (ref 0.0–0.2)

## 2020-11-11 LAB — GLUCOSE, CAPILLARY
Glucose-Capillary: 132 mg/dL — ABNORMAL HIGH (ref 70–99)
Glucose-Capillary: 145 mg/dL — ABNORMAL HIGH (ref 70–99)
Glucose-Capillary: 278 mg/dL — ABNORMAL HIGH (ref 70–99)
Glucose-Capillary: 193 mg/dL — ABNORMAL HIGH (ref 70–99)

## 2020-11-11 NOTE — Telephone Encounter (Signed)
Tried calling patient to inform about Mobile Mammogram and Fecal screening bus being In her area on August 24th and September 21st. Will try to reach patient again at a later time due to being unable to reach her and unable to lmom.

## 2020-11-11 NOTE — Progress Notes (Signed)
Pts glasses placed on bedside table.

## 2020-11-11 NOTE — Progress Notes (Signed)
Carotid duplex has been completed.   Preliminary results in CV Proc.   Abram Sander 11/11/2020 8:35 AM

## 2020-11-11 NOTE — Progress Notes (Signed)
Physical Therapy Treatment Patient Details Name: [name] MRN: [id number] DOB: Mar 27, 1956 Today's Date: 11/11/2020    History of Present Illness Pt is a 65 y.o. female who presented 7/9 with R gaze preference, slurred speech, and L-sided weakness. tPA was administered. 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 CTA head and neck b/l siphon severe stenosis, left ICA bulb 70% and right ICA bulb 50-60% stenosis. 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 PMH: HTN, HLD, DM2, CAD/NSTEMI s/p stent, L hip arthritis, COPD, MI, PVD, CKD, CHF.    PT Comments    Patient progressing towards physical therapy goals. Initiated gait training with SPC but patient demos instability and requires minA to maintain balance. With RW, patient with improved gait quality and required supervision for safety. Cues throughout for forward gaze and upright posture. Educated patient on use of RW for mobility at discharge for safety and stability, patient verbalized understanding. Negotiated 2 stairs with HHAx1 and min guard for safety. Continue to recommend OPPT following discharge to address balance and strength deficits.     Follow Up Recommendations  Outpatient PT;Supervision for mobility/OOB     Equipment Recommendations  None recommended by PT    Recommendations for Other Services       Precautions / Restrictions Precautions Precautions: Fall Restrictions Weight Bearing Restrictions: No    Mobility  Bed Mobility Overal bed mobility: Modified Independent                  Transfers Overall transfer level: Needs assistance Equipment used: Rolling Coleston Dirosa (2 wheeled) Transfers: Sit to/from Stand Sit to Stand: Supervision         General transfer comment: supervision for safety  Ambulation/Gait Ambulation/Gait assistance: Min assist;Supervision Gait Distance (Feet): 50 Feet (x150) Assistive device: Straight cane;Rolling Jomaira Darr (2  wheeled) Gait Pattern/deviations: Step-through pattern;Decreased step length - left;Decreased stride length;Antalgic;Drifts right/left;Trunk flexed Gait velocity: decreased   General Gait Details: 64' with SPC requiring minA for balance. Provided RW with increased stability and comfort requiring supervision for safety. Cues for forward gaze and upright posture   Stairs Stairs: Yes Stairs assistance: Min guard Stair Management: One rail Right;Step to pattern;Forwards Number of Stairs: 2 General stair comments: using HHA as rail on R   Wheelchair Mobility    Modified Rankin (Stroke Patients Only) Modified Rankin (Stroke Patients Only) Pre-Morbid Rankin Score: No significant disability Modified Rankin: Moderately severe disability     Balance Overall balance assessment: Needs assistance Sitting-balance support: No upper extremity supported;Feet supported Sitting balance-Leahy Scale: Good     Standing balance support: Bilateral upper extremity supported;During functional activity Standing balance-Leahy Scale: Poor Standing balance comment: Reliant on UE support with mobility.                            Cognition Arousal/Alertness: Awake/alert Behavior During Therapy: WFL for tasks assessed/performed Overall Cognitive Status: Impaired/Different from baseline Area of Impairment: Memory;Problem solving;Attention;Safety/judgement;Awareness                   Current Attention Level: Selective Memory: Decreased short-term memory   Safety/Judgement: Decreased awareness of safety;Decreased awareness of deficits Awareness: Emergent Problem Solving: Slow processing General Comments: STM deficits noted with needing room number written down at end of session to remember after being prompted during ambulation      Exercises      General Comments  Pertinent Vitals/Pain Pain Assessment: No/denies pain    Home Living                       Prior Function            PT Goals (current goals can now be found in the care plan section) Acute Rehab PT Goals Patient Stated Goal: to go home PT Goal Formulation: With patient/family Time For Goal Achievement: 11/22/20 Potential to Achieve Goals: Good Progress towards PT goals: Progressing toward goals    Frequency    Min 4X/week      PT Plan Current plan remains appropriate    Co-evaluation              AM-PAC PT "6 Clicks" Mobility   Outcome Measure  Help needed turning from your back to your side while in a flat bed without using bedrails?: None Help needed moving from lying on your back to sitting on the side of a flat bed without using bedrails?: None Help needed moving to and from a bed to a chair (including a wheelchair)?: A Little Help needed standing up from a chair using your arms (e.g., wheelchair or bedside chair)?: A Little Help needed to walk in hospital room?: A Little Help needed climbing 3-5 steps with a railing? : A Little 6 Click Score: 20    End of Session Equipment Utilized During Treatment: Gait belt Activity Tolerance: Patient tolerated treatment well Patient left: in bed;with call bell/phone within reach;with bed alarm set Nurse Communication: Mobility status PT Visit Diagnosis: Unsteadiness on feet (R26.81);Other abnormalities of gait and mobility (R26.89);Muscle weakness (generalized) (M62.81);Difficulty in walking, not elsewhere classified (R26.2);Other symptoms and signs involving the nervous system [id number])     Time: 1152-1209 PT Time Calculation (min) (ACUTE ONLY): 17 min  Charges:  $Gait Training: 8-22 mins                     Simuel Stebner A. Gilford Rile PT, DPT Acute Rehabilitation Services Pager [phone] Office [phone]    Linna Hoff 11/11/2020, 12:45 PM

## 2020-11-11 NOTE — Progress Notes (Signed)
Carotid duplex from today reviewed.  Bilateral 40 to 60% carotid stenosis.  As mentioned yesterday patient had a fairly large amount of brain involved in her infarct.  We will give her 4 to 6 weeks to recover from this before considering carotid intervention.  She has follow-up scheduled with my partner Dr. Donzetta Matters on December 18, 2020.  Ruta Hinds, MD [facility] Office: [phone]

## 2020-11-11 NOTE — Progress Notes (Signed)
STROKE TEAM PROGRESS NOTE   SUBJECTIVE (INTERVAL HISTORY) She did leave last night because it got too late by time CUS completed and she had no ride. Today, her daughter will come after 5pm to take her home. Reviewed d/c plan with pt. No new issues or complaints at this time.   OBJECTIVE Temp:  [97.7 F (36.5 C)-98.7 F (37.1 C)] 97.7 F (36.5 C) (07/13 0340) Pulse Rate:  [67-76] 67 (07/13 0340) Cardiac Rhythm: Normal sinus rhythm (07/13 0801) Resp:  [18-19] 18 (07/13 0340) BP: (130-147)/(56-78) 147/67 (07/13 0340) SpO2:  [95 %-100 %] 100 % (07/13 0340)  Recent Labs  Lab 11/10/20 1118 11/10/20 1645 11/10/20 2040 11/11/20 0744 11/11/20 1211  GLUCAP 297* 191* 313* 145* 278*    Recent Labs  Lab 11/07/20 1641 11/09/20 0327 11/10/20 0336 11/11/20 0028  NA 136 138 136 137  K 4.7 3.7 3.9 4.2  CL 105 107 106 108  CO2 16* 20* 20* 22  GLUCOSE 501* 193* 164* 67*  BUN 26* 22 27* 32*  CREATININE 2.18* 1.75* 1.85* 1.96*  CALCIUM 8.8* 8.9 9.1 9.1    Recent Labs  Lab 11/07/20 1641  AST 25  ALT 15  ALKPHOS 90  BILITOT 0.5  PROT 6.3*  ALBUMIN 2.8*    Recent Labs  Lab 11/07/20 1641 11/09/20 0327 11/10/20 0336 11/11/20 0028  WBC 5.3 6.6 6.1 6.1  NEUTROABS 3.4  --   --   --   HGB 12.3 10.6* 10.6* 10.6*  HCT 41.0 32.8* 32.5* 33.0*  MCV 96.5 89.6 88.8 89.9  PLT 160 208 216 229    No results for input(s): CKTOTAL, CKMB, CKMBINDEX, TROPONINI in the last 168 hours. No results for input(s): LABPROT, INR in the last 72 hours.  No results for input(s): COLORURINE, LABSPEC, [place], GLUCOSEU, HGBUR, BILIRUBINUR, KETONESUR, PROTEINUR, UROBILINOGEN, NITRITE, LEUKOCYTESUR in the last 72 hours.  Invalid input(s): APPERANCEUR     Component Value Date/Time   CHOL 222 (H) 11/07/2020 2350   CHOL 149 08/31/2017 0941   TRIG 75 11/07/2020 2350   HDL 51 11/07/2020 2350   HDL 60 08/31/2017 0941   CHOLHDL 4.4 11/07/2020 2350   VLDL 15 11/07/2020 2350   LDLCALC 156 (H) 11/07/2020  2350   LDLCALC 74 08/31/2017 0941   Lab Results  Component Value Date   HGBA1C 9.2 (H) 11/07/2020      Component Value Date/Time   LABOPIA NONE DETECTED 11/08/2020 0840   COCAINSCRNUR NONE DETECTED 11/08/2020 0840   LABBENZ NONE DETECTED 11/08/2020 0840   AMPHETMU NONE DETECTED 11/08/2020 0840   THCU POSITIVE (A) 11/08/2020 0840   LABBARB NONE DETECTED 11/08/2020 0840    No results for input(s): ETH in the last 168 hours.  I have personally reviewed the radiological images below and agree with the radiology interpretations.  MR BRAIN WO CONTRAST  Result Date: 11/08/2020 CLINICAL DATA:  Stroke, follow-up status post tPA EXAM: MRI HEAD WITHOUT CONTRAST TECHNIQUE: Multiplanar, multiecho pulse sequences of the brain and surrounding structures were obtained without intravenous contrast. COMPARISON:  CT exams from yesterday. FINDINGS: Brain: Acute infarct in the right frontal lobe, operculum, and anterior insula.Associated edema without significant mass effect. No evidence of acute mass occupying hemorrhagic transformation. There are numerous small chronic microhemorrhages in bilateral basal ganglia and thalami and the brainstem, as well as a few in the supratentorial white matter. Remote lacunar infarcts in the left corona radiata, right basal ganglia, right thalamus, midbrain and pons. No mass lesion or extra-axial fluid collection. No  midline shift. Basal cisterns are patent. Vascular: Major arterial flow voids are maintained at the skull base. Skull and upper cervical spine: Normal marrow signal. Sinuses/Orbits: Moderate ethmoid, maxillary, and right sphenoid sinus mucosal thickening. Other: No sizable mastoid effusions. IMPRESSION: 1. Acute infarct in the right frontal lobe, operculum, and anterior insula. Associated edema without significant mass effect. Remote lacunar infarcts in the left corona radiata, right basal ganglia, right thalamus, midbrain and pons. 2. Numerous prior microhemorrhages  that are predominantly in the basal ganglia/thalami and brainstem, most likely secondary to hypertension. 3. Ventriculomegaly, which is mildly out of proportion to the degree of sulcal volume loss. Additionally, the callosal angle is somewhat acute and there is crowding of sulci at the vertex. Findings could be secondary to normal pressure hydrocephalus or central predominant atrophy with ex vacuo ventricular dilation. 4. Moderate paranasal sinus mucosal thickening. Electronically Signed   By: Margaretha Sheffield MD   On: 11/08/2020 15:30   ECHOCARDIOGRAM COMPLETE  Result Date: 11/08/2020    ECHOCARDIOGRAM REPORT   Patient Name:   [name] Date of Exam: 11/08/2020 Medical Rec #:  [id number]      Height:       63.0 in Accession #:    [id number]     Weight:       160.7 lb Date of Birth:  09-Sep-1955     BSA:          1.762 m Patient Age:    65 years       BP:           149/82 mmHg Patient Gender: F              HR:           79 bpm. Exam Location:  Inpatient Procedure: 2D Echo, 3D Echo, Cardiac Doppler, Color Doppler and Intracardiac            Opacification Agent Indications:    Stroke  History:        Patient has prior history of Echocardiogram examinations, most                 recent 06/06/2020. CHF, CAD and Previous Myocardial Infarction,                 COPD; Risk Factors:Hypertension, Dyslipidemia and Diabetes.  Sonographer:    Clayton Lefort RDCS (AE) Referring Phys: [place]  1. Left ventricular ejection fraction, by estimation, is 45 to 50%. The left ventricle has mildly decreased function. The left ventricle demonstrates global hypokinesis. There is moderate concentric left ventricular hypertrophy. Left ventricular diastolic parameters are consistent with Grade II diastolic dysfunction (pseudonormalization). Elevated left atrial pressure. There is very mild hypokinesis of the left ventricular, basal-mid inferior wall and inferoseptal wall. There is profound LBBB-related  septal-lateral wall dyssynchrony that accounts for most of the decrease in LVEF.  2. Right ventricular systolic function is normal. The right ventricular size is normal. There is mildly elevated pulmonary artery systolic pressure.  3. Left atrial size was mild to moderately dilated.  4. The mitral valve is degenerative. Mild mitral valve regurgitation. No evidence of mitral stenosis. Moderate to severe mitral annular calcification.  5. The aortic valve is tricuspid. There is moderate calcification of the aortic valve. There is moderate thickening of the aortic valve. Aortic valve regurgitation is mild. Mild to moderate aortic valve sclerosis/calcification is present, without any evidence of aortic stenosis.  6. The inferior vena cava is normal in size  with greater than 50% respiratory variability, suggesting right atrial pressure of 3 mmHg. Comparison(s): Prior images reviewed side by side. The left ventricular function has improved. The left ventricular wall motion has improved. FINDINGS  Left Ventricle: There is profound LBBB-related septal-lateral wall dyssynchrony that accounts for most of the decrease in LVEF. Left ventricular ejection fraction, by estimation, is 45 to 50%. The left ventricle has mildly decreased function. The left ventricle demonstrates global hypokinesis. Mild hypokinesis of the left ventricular, basal-mid inferior wall and inferoseptal wall. Definity contrast agent was given IV to delineate the left ventricular endocardial borders. 3D left ventricular ejection fraction analysis performed but not reported based on interpreter judgement due to suboptimal quality. The left ventricular internal cavity size was normal in size. There is moderate concentric left ventricular hypertrophy. Abnormal (paradoxical) septal motion, consistent with left bundle branch block. Left ventricular diastolic parameters are consistent with Grade II diastolic dysfunction (pseudonormalization). Elevated left atrial  pressure. Right Ventricle: The right ventricular size is normal. No increase in right ventricular wall thickness. Right ventricular systolic function is normal. There is mildly elevated pulmonary artery systolic pressure. The tricuspid regurgitant velocity is 2.76  m/s, and with an assumed right atrial pressure of 3 mmHg, the estimated right ventricular systolic pressure is [id number] mmHg. Left Atrium: Left atrial size was mild to moderately dilated. Right Atrium: Right atrial size was normal in size. Pericardium: There is no evidence of pericardial effusion. Mitral Valve: The mitral valve is degenerative in appearance. Moderate to severe mitral annular calcification. Mild mitral valve regurgitation. No evidence of mitral valve stenosis. MV peak gradient, 5.4 mmHg. The mean mitral valve gradient is 3.0 mmHg. Tricuspid Valve: The tricuspid valve is normal in structure. Tricuspid valve regurgitation is not demonstrated. Aortic Valve: There is a broad-based fixed calcified echodensity on the tip of the noncoronary cusp 4x7 mm in size. It probably represents degenerative change, but may be a healed vegetation. The aortic valve is tricuspid. There is moderate calcification  of the aortic valve. There is moderate thickening of the aortic valve. Aortic valve regurgitation is mild. Aortic regurgitation PHT measures 373 msec. Mild to moderate aortic valve sclerosis/calcification is present, without any evidence of aortic stenosis. Aortic valve mean gradient measures 4.0 mmHg. Aortic valve peak gradient measures 8.3 mmHg. Aortic valve area, by VTI measures 1.79 cm. Pulmonic Valve: The pulmonic valve was normal in structure. Pulmonic valve regurgitation is not visualized. Aorta: The aortic root and ascending aorta are structurally normal, with no evidence of dilitation. Venous: The inferior vena cava is normal in size with greater than 50% respiratory variability, suggesting right atrial pressure of 3 mmHg. IAS/Shunts: No atrial  level shunt detected by color flow Doppler.  LEFT VENTRICLE PLAX 2D LVIDd:         4.20 cm      Diastology LVIDs:         3.60 cm      LV e' medial:    3.45 cm/s LV PW:         1.50 cm      LV E/e' medial:  34.2 LV IVS:        1.70 cm      LV e' lateral:   6.83 cm/s LVOT diam:     1.90 cm      LV E/e' lateral: 17.3 LV SV:         52 LV SV Index:   30 LVOT Area:     2.84 cm  3D Volume EF: LV Volumes (MOD)            3D EF:        36 % LV vol d, MOD A2C: 98.1 ml  LV EDV:       125 ml LV vol d, MOD A4C: 110.0 ml LV ESV:       80 ml LV vol s, MOD A2C: 54.0 ml  LV SV:        45 ml LV vol s, MOD A4C: 69.0 ml LV SV MOD A2C:     44.1 ml LV SV MOD A4C:     110.0 ml LV SV MOD BP:      46.5 ml RIGHT VENTRICLE             IVC RV Basal diam:  2.80 cm     IVC diam: 1.80 cm RV S prime:     10.40 cm/s TAPSE (M-mode): 2.0 cm LEFT ATRIUM           Index       RIGHT ATRIUM           Index LA diam:      4.10 cm 2.33 cm/m  RA Area:     14.00 cm LA Vol (A4C): 65.0 ml 36.89 ml/m RA Volume:   32.20 ml  18.27 ml/m  AORTIC VALVE AV Area (Vmax):    1.89 cm AV Area (Vmean):   1.56 cm AV Area (VTI):     1.79 cm AV Vmax:           144.00 cm/s AV Vmean:          97.500 cm/s AV VTI:            0.291 m AV Peak Grad:      8.3 mmHg AV Mean Grad:      4.0 mmHg LVOT Vmax:         95.90 cm/s LVOT Vmean:        53.500 cm/s LVOT VTI:          0.184 m LVOT/AV VTI ratio: 0.63 AI PHT:            373 msec  AORTA Ao Root diam: 2.70 cm Ao Asc diam:  3.30 cm MITRAL VALVE                TRICUSPID VALVE MV Area (PHT): 5.27 cm     TR Peak grad:   30.5 mmHg MV Peak grad:  5.4 mmHg     TR Vmax:        276.00 cm/s MV Mean grad:  3.0 mmHg MV Vmax:       1.16 m/s     SHUNTS MV Vmean:      87.7 cm/s    Systemic VTI:  0.18 m MV Decel Time: 144 msec     Systemic Diam: 1.90 cm MV E velocity: 118.00 cm/s MV A velocity: 103.00 cm/s MV E/A ratio:  1.15 Mihai Croitoru MD Electronically signed by Sanda Klein MD Signature Date/Time:  11/08/2020/5:09:44 PM    Final    CT HEAD CODE STROKE WO CONTRAST  Result Date: 11/07/2020 CLINICAL DATA:  Code stroke.  Neuro deficit, acute stroke suspected. EXAM: CT HEAD WITHOUT CONTRAST TECHNIQUE: Contiguous axial images were obtained from the base of the skull through the vertex without intravenous contrast. COMPARISON:  October 03, 2007. FINDINGS: Brain: Remote lacunar infarct in the right thalamus and right basal ganglia, present on the prior CT head. No evidence  of acute large vascular territory infarct or acute hemorrhage. Progressive patchy white matter hypoattenuation, most likely related to chronic microvascular ischemic disease. Progressive ventriculomegaly, which is mildly out of proportion to the degree of sulcal volume loss. Additionally, the callosum angle is somewhat acute and there is crowding of sulci at the vertex Vascular: No hyperdense vessel identified. Calcific atherosclerosis. Skull: No acute fracture. Sinuses/Orbits: Moderate mucosal thickening. No acute orbital findings. Other: No mastoid effusions. ASPECTS [facility] Stroke Program Early CT Score) total score (0-10 with 10 being normal): 10. IMPRESSION: 1. No evidence of acute large vascular territory infarct or acute hemorrhage. ASPECTS is 10 2. Progressive white matter hypoattenuation, most likely related to chronic microvascular ischemic disease. An MRI could provide more sensitive evaluation for acute white matter infarct if clinically indicated. 3. Remote infarcts in the right thalamus, right basal ganglia. 4. Progressive ventriculomegaly, which is mildly out of proportion to the degree of sulcal volume loss. Additionally, the callosal angle is somewhat acute and there is crowding of sulci at the vertex. Consider normal pressure hydrocephalus. Central predominant atrophy with ex vacuo ventricular dilation is a differential consideration. Findings discussed with Dr. Leonel Ramsay at 5:06 p.m. Electronically Signed   By: Margaretha Sheffield  MD   On: 11/07/2020 17:11   VAS US CAROTID  Result Date: 11/11/2020 Carotid Arterial Duplex Study Patient Name:  NOTASHA ALERS  Date of Exam:   11/11/2020 Medical Rec #: [id number]       Accession #:    [id number] Date of Birth: 05/18/1955      Patient Gender: F Patient Age:   64Y Exam Location:  [facility] Procedure:      VAS US CAROTID Referring Phys: [id number] Aldona Bar J RHYNE --------------------------------------------------------------------------------  Indications:       CVA. Risk Factors:      Hypertension, hyperlipidemia, Diabetes. Comparison Study:  cta neck 11/07/20 Approximately 70% stenosis of the proximal                    left ICA, Approximately 50-60% stenosis of the right ICA                    origin Performing Technologist: Archie Patten RVS  Examination Guidelines: A complete evaluation includes B-mode imaging, spectral Doppler, color Doppler, and power Doppler as needed of all accessible portions of each vessel. Bilateral testing is considered an integral part of a complete examination. Limited examinations for reoccurring indications may be performed as noted.  Right Carotid Findings: +----------+-------+-------+--------+---------------------------------+--------+           PSV    EDV    StenosisPlaque Description               Comments           cm/s   cm/s                                                     +----------+-------+-------+--------+---------------------------------+--------+ CCA Prox  58     12             heterogenous                              +----------+-------+-------+--------+---------------------------------+--------+ CCA Distal62     15  heterogenous                              +----------+-------+-------+--------+---------------------------------+--------+ ICA Prox  187    42     40-59%  heterogenous, irregular and                                               calcific                                   +----------+-------+-------+--------+---------------------------------+--------+ ICA Mid   84     16                                                       +----------+-------+-------+--------+---------------------------------+--------+ ICA Distal68     21                                                       +----------+-------+-------+--------+---------------------------------+--------+ ECA       221    15                                                       +----------+-------+-------+--------+---------------------------------+--------+ +----------+--------+-------+--------+-------------------+           PSV cm/sEDV cmsDescribeArm Pressure (mmHG) +----------+--------+-------+--------+-------------------+ [id number]                                         +----------+--------+-------+--------+-------------------+ +---------+--------+--+--------+--+----------+ VertebralPSV cm/s91EDV cm/s18Retrograde +---------+--------+--+--------+--+----------+  Left Carotid Findings: +----------+--------+--------+--------+-------------------+--------------------+           PSV cm/sEDV cm/sStenosisPlaque Description Comments             +----------+--------+--------+--------+-------------------+--------------------+ CCA Prox  82      13              heterogenous                            +----------+--------+--------+--------+-------------------+--------------------+ CCA Distal43      8               heterogenous                            +----------+--------+--------+--------+-------------------+--------------------+ ICA Prox  139     51      40-59%  heterogenous,      Velocities may                                         irregular and      underestimate degree  calcific           of stenosis due to                                                        more proximal                                                              obstruction.         +----------+--------+--------+--------+-------------------+--------------------+ ICA Mid   66      19                                                      +----------+--------+--------+--------+-------------------+--------------------+ ICA Distal54      15                                                      +----------+--------+--------+--------+-------------------+--------------------+ ECA       175                                                             +----------+--------+--------+--------+-------------------+--------------------+ +----------+--------+--------+--------+-------------------+           PSV cm/sEDV cm/sDescribeArm Pressure (mmHG) +----------+--------+--------+--------+-------------------+ [id number]                                         +----------+--------+--------+--------+-------------------+ +---------+--------+--+--------+--+---------+ VertebralPSV cm/s38EDV cm/s14Antegrade +---------+--------+--+--------+--+---------+   Summary: Right Carotid: Velocities in the right ICA are consistent with a 40-59%                stenosis. Left Carotid: Velocities in the left ICA are consistent with a 40-59% stenosis. Vertebrals: Bilateral vertebral arteries demonstrate antegrade flow. *See table(s) above for measurements and observations.     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 This addendum was discussed with Dr. Lorrin Goodell via telephone at 7:40 p.m. Electronically Signed   By: Margaretha Sheffield MD   On: 11/07/2020 19:40   Result Date: 11/07/2020 CLINICAL DATA:  Stroke/TIA. EXAM: CT HEAD WITHOUT CONTRAST CT ANGIOGRAPHY OF THE HEAD AND NECK TECHNIQUE: Contiguous axial images were obtained from the  base of the skull through the vertex without intravenous contrast. Multidetector CT imaging of the head and neck was performed using the standard protocol during bolus administration of intravenous contrast. Multiplanar CT image reconstructions and MIPs were  obtained to evaluate the vascular anatomy. Carotid stenosis measurements (when applicable) are obtained utilizing NASCET criteria, using the distal internal carotid diameter as the denominator. CONTRAST:  81m OMNIPAQUE IOHEXOL 350 MG/ML SOLN COMPARISON:  Same day CT head. FINDINGS: CTA NECK Aortic arch: Great vessel origins are patent. Right carotid system: Calcific and noncalcific atherosclerosis at the carotid bifurcation with 50-60% stenosis of the ICA origin. Left carotid system: Calcific and noncalcific atherosclerosis involving the carotid bifurcation and proximal ICA with approximately 70%. Vertebral arteries:Mildly right dominant. There is moderate stenosis of the right vertebral artery at the C6-C7 level due to mass effect from an adjacent osteophyte Skeleton: Moderate to severe multilevel degenerative change. Other neck: No acute abnormality.  Visualized lung apices are clear. CTA HEAD Anterior circulation: Extensive calcific atherosclerosis of bilateral cavernous and paraclinoid ICAs with severe right and moderate left cavernous and paraclinoid ICA stenosis. Bilateral MCAs are patent. Bilateral ACAs are patent. No aneurysm identified. Posterior circulation: Bilateral intradural vertebral arteries are patent. Mild-to-moderate mid basilar artery stenosis. Bilateral PCAs are patent. Mild multifocal stenosis of bilateral PCAs. No aneurysm identified. Venous sinuses: Within limitation non arterial timing, no evidence of dural venous sinus thrombosis. IMPRESSION: CTA head: 1. No large vessel occlusion. 2. 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 3. Mild to moderate stenosis of the mid basilar artery and mild bilateral PCA  stenosis. CTA Neck: 1. Approximately 70% stenosis of the proximal left ICA. 2. Approximately 50-60% stenosis of the right ICA origin 3. 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 This addendum was discussed with Dr. KLorrin Goodellvia telephone at 7:40 p.m. Electronically Signed   By: FMargaretha SheffieldMD   On: 11/07/2020 19:40   Result Date: 11/07/2020 CLINICAL DATA:  Stroke/TIA. EXAM: CT HEAD WITHOUT CONTRAST CT ANGIOGRAPHY OF THE HEAD AND NECK TECHNIQUE: Contiguous axial images were obtained from the base of the skull through the vertex without intravenous contrast. Multidetector CT imaging of the head and neck was performed using the standard protocol during bolus administration of intravenous contrast. Multiplanar CT image reconstructions and MIPs were obtained to evaluate the vascular anatomy. Carotid stenosis measurements (when applicable) are obtained utilizing NASCET criteria, using the distal internal carotid diameter as the denominator. CONTRAST:  787mOMNIPAQUE IOHEXOL 350 MG/ML SOLN COMPARISON:  Same day CT head. FINDINGS: CTA NECK Aortic arch: Great vessel origins are patent. Right carotid system: Calcific and noncalcific atherosclerosis at the carotid bifurcation with 50-60% stenosis of the ICA origin. Left carotid system: Calcific and noncalcific atherosclerosis involving the carotid bifurcation and proximal ICA with approximately 70%. Vertebral arteries:Mildly right dominant. There is moderate stenosis of the right vertebral artery at the C6-C7 level due to mass effect from an adjacent osteophyte Skeleton: Moderate to severe  multilevel degenerative change. Other neck: No acute abnormality.  Visualized lung apices are clear. CTA HEAD Anterior circulation: Extensive calcific atherosclerosis of bilateral cavernous and paraclinoid ICAs with severe right and moderate left cavernous and paraclinoid ICA stenosis. Bilateral MCAs are patent. Bilateral ACAs are patent. No aneurysm identified. Posterior circulation: Bilateral intradural vertebral arteries are patent. Mild-to-moderate mid basilar artery stenosis. Bilateral PCAs are patent. Mild  multifocal stenosis of bilateral PCAs. No aneurysm identified. Venous sinuses: Within limitation non arterial timing, no evidence of dural venous sinus thrombosis. IMPRESSION: CTA head: 1. No large vessel occlusion. 2. 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 3. Mild to moderate stenosis of the mid basilar artery and mild bilateral PCA stenosis. CTA Neck: 1. Approximately 70% stenosis of the proximal left ICA. 2. Approximately 50-60% stenosis of the right ICA origin 3. Moderate stenosis of the right vertebral artery at the C6-C7 level due to mass effect from an adjacent osteophyte Electronically Signed: By: Margaretha Sheffield MD On: 11/07/2020 18:09      PHYSICAL EXAM  Temp:  [97.7 F (36.5 C)-98.7 F (37.1 C)] 97.7 F (36.5 C) (07/13 0340) Pulse Rate:  [67-76] 67 (07/13 0340) Resp:  [18-19] 18 (07/13 0340) BP: (130-147)/(56-78) 147/67 (07/13 0340) SpO2:  [95 %-100 %] 100 % (07/13 0340)  General - Well nourished, well developed, in no apparent distress.  Ophthalmologic - fundi not visualized due to noncooperation.  Cardiovascular - Regular rhythm and rate.  Mental Status -  A&Ox4. Language including expression, naming, repetition, comprehension was assessed and found intact. Fund of Knowledge was assessed and was intact.  Cranial Nerves II - XII - II - Visual field intact III, IV, VI - Extraocular movements intact. V - Facial sensation intact  bilaterally. VII - mild left flattening of naso-labial fold. VIII - Hearing & vestibular intact bilaterally. X - Palate elevates symmetrically. XI - Chin turning & shoulder shrug intact bilaterally. XII - Tongue protrusion intact.  Motor Strength - The patient's strength was normal in all extremities and pronator drift was absent.  Bulk was normal and fasciculations were absent.   Motor Tone - Muscle tone was assessed at the neck and appendages and was normal.  Reflexes - The patient's reflexes were symmetrical in all extremities and she had no pathological reflexes.  Sensory - Intact to light touch and symmetrical.    Coordination - The patient had normal movements in the hands and feet with no ataxia or dysmetria.  Tremor was absent.  Gait and Station - deferred.  ASSESSMENT/PLAN [name] is a 65 y.o. female with history of HTN, HLD, DM, CAD/NSTEMI s/p stent, CKD, CHF admitted for left facial droop, slurry speech and right gaze preference. tPA was given.    Stroke: Rt MCA, etiology could be d/t RICA stenosis vs. CHF. Vascular Sx was consulted and they plan for close out pt f/u in 4 weeks for possible TCAR vs CEA.  Resultant left facial droop CT no acute finding but old right thalamic and BG lacunes CTA head and neck b/l siphon severe stenosis, left ICA bulb 70% and right ICA bulb 50-60% stenosis MRI Acute infarct in the right frontal lobe, operculum, and anterior insula. Associated edema without significant mass effect. Remote lacunar infarcts in the left corona radiata, right basal ganglia, right thalamus, midbrain and pons. 2. Numerous prior microhemorrhages that are predominantly in the basal ganglia/thalami and brainstem, most likely secondary to hypertension. 3. Ventriculomegaly, which is mildly out of proportion to the degree of sulcal volume loss. Additionally, the callosal angle is somewhat acute and there is crowding of sulci at the vertex. Findings could be  secondary to normal pressure hydrocephalus or central predominant atrophy with ex vacuo ventricular dilation. 2D Echo  45% EF, LV has global hypokinesis, mod LVH. LA dilation LDL 156 HgbA1c 9.2 UDS + for THC SCDs for VTE prophylaxis aspirin 81 mg daily  and Brilinta (ticagrelor) 90 mg bid prior to admission, now restarted on aspirin and Brilinta.  Patient counseled to be compliant with her antithrombotic medications Ongoing aggressive stroke risk factor management Therapy recommendations: Outpatient PT/OT Disposition:  pending  Cerebrovascular stenosis CTA head and neck b/l siphon severe stenosis, left ICA bulb 70% and right ICA bulb 50-60% stenosis CUS:  Bilat 40-59% (done per vascular for out pt follow up and surgical planning)  Vascular surgery consulted for right ICA stenosis and plan made for out pt close f/u in 4 weeks for consideration of CEA/TCAR.  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 After hours, contact General Neurology

## 2020-11-12 ENCOUNTER — Telehealth: Payer: Self-pay

## 2020-11-12 ENCOUNTER — Other Ambulatory Visit (HOSPITAL_COMMUNITY): Payer: Self-pay

## 2020-11-12 NOTE — Telephone Encounter (Signed)
Transition Care Management Unsuccessful Follow-up Telephone Call  Date of discharge and from where:  11/11/2020- [facility]  Attempts:  1st Attempt  Reason for unsuccessful TCM follow-up call:  Unable to reach patient

## 2020-11-16 NOTE — Telephone Encounter (Signed)
Transition Care Management Unsuccessful Follow-up Telephone Call  Date of discharge and from where:  11/11/2020-[facility]   Attempts:  2nd Attempt  Reason for unsuccessful TCM follow-up call:  Unable to reach patient

## 2020-11-17 ENCOUNTER — Other Ambulatory Visit (HOSPITAL_COMMUNITY): Payer: Self-pay

## 2020-11-17 NOTE — Telephone Encounter (Signed)
Transition Care Management Unsuccessful Follow-up Telephone Call  Date of discharge and from where:  7/13/20022-[facility] ED   Attempts:  3rd Attempt  Reason for unsuccessful TCM follow-up call:  Unable to reach patient

## 2020-11-18 ENCOUNTER — Other Ambulatory Visit (HOSPITAL_COMMUNITY): Payer: Self-pay

## 2020-11-25 ENCOUNTER — Encounter: Payer: Medicaid Other | Admitting: Internal Medicine

## 2020-11-30 DIAGNOSIS — U071 COVID-19: Secondary | ICD-10-CM | POA: Diagnosis not present

## 2020-12-07 ENCOUNTER — Other Ambulatory Visit (HOSPITAL_COMMUNITY): Payer: Self-pay

## 2020-12-07 ENCOUNTER — Other Ambulatory Visit: Payer: Self-pay | Admitting: Student in an Organized Health Care Education/Training Program

## 2020-12-08 ENCOUNTER — Other Ambulatory Visit (HOSPITAL_COMMUNITY): Payer: Self-pay

## 2020-12-08 ENCOUNTER — Other Ambulatory Visit: Payer: Self-pay | Admitting: Student in an Organized Health Care Education/Training Program

## 2020-12-09 ENCOUNTER — Other Ambulatory Visit: Payer: Self-pay | Admitting: Student in an Organized Health Care Education/Training Program

## 2020-12-09 ENCOUNTER — Other Ambulatory Visit (HOSPITAL_COMMUNITY): Payer: Self-pay

## 2020-12-10 ENCOUNTER — Other Ambulatory Visit (HOSPITAL_COMMUNITY): Payer: Self-pay

## 2020-12-10 NOTE — Telephone Encounter (Signed)
Patient requesting refill of tramadol that was prescribed 04/22. This medication was written for as short course therapy for the patient to use as needed for severe hip pain. Patient was referred to orthopedics for PT and possible steroid injections but it seems as though she missed this appointment.   She will need to follow up in our clinic for further discussion of her pain before a refill can be sent, will deny for now. Please schedule the patient an appointment to be seen. Thanks!

## 2020-12-11 ENCOUNTER — Other Ambulatory Visit (HOSPITAL_COMMUNITY): Payer: Self-pay

## 2020-12-16 ENCOUNTER — Telehealth: Payer: Self-pay | Admitting: *Deleted

## 2020-12-16 ENCOUNTER — Other Ambulatory Visit (HOSPITAL_COMMUNITY): Payer: Self-pay

## 2020-12-16 NOTE — Telephone Encounter (Signed)
Call from [facility] - pt is there and requesting pen needles for her Lantus solostar pen. Lantus is not currently on her med list. Pt was asked if she come tomorrow to see the doctor about her insulin; she agreed. I called pt back on her cell phone - appt made, she will see Dr Marianna Payment tomorrow morning 8/18 @ 0845 AM; instructed pt to bring her meter and all of her meds.

## 2020-12-17 ENCOUNTER — Encounter: Payer: Medicaid Other | Admitting: Internal Medicine

## 2020-12-18 ENCOUNTER — Ambulatory Visit: Payer: Medicaid Other | Admitting: Vascular Surgery

## 2020-12-23 ENCOUNTER — Inpatient Hospital Stay: Payer: Medicaid Other | Admitting: Adult Health

## 2020-12-24 ENCOUNTER — Encounter: Payer: Medicaid Other | Admitting: Internal Medicine

## 2020-12-28 ENCOUNTER — Encounter: Payer: Medicaid Other | Admitting: Internal Medicine

## 2021-01-11 ENCOUNTER — Other Ambulatory Visit (HOSPITAL_COMMUNITY): Payer: Self-pay

## 2021-01-11 ENCOUNTER — Encounter: Payer: Self-pay | Admitting: Internal Medicine

## 2021-01-11 ENCOUNTER — Other Ambulatory Visit: Payer: Self-pay | Admitting: Internal Medicine

## 2021-01-11 ENCOUNTER — Ambulatory Visit (INDEPENDENT_AMBULATORY_CARE_PROVIDER_SITE_OTHER): Payer: Medicaid Other | Admitting: Internal Medicine

## 2021-01-11 ENCOUNTER — Other Ambulatory Visit: Payer: Self-pay

## 2021-01-11 DIAGNOSIS — E1122 Type 2 diabetes mellitus with diabetic chronic kidney disease: Secondary | ICD-10-CM

## 2021-01-11 DIAGNOSIS — I5042 Chronic combined systolic (congestive) and diastolic (congestive) heart failure: Secondary | ICD-10-CM

## 2021-01-11 DIAGNOSIS — E1165 Type 2 diabetes mellitus with hyperglycemia: Secondary | ICD-10-CM

## 2021-01-11 DIAGNOSIS — IMO0002 Reserved for concepts with insufficient information to code with codable children: Secondary | ICD-10-CM

## 2021-01-11 DIAGNOSIS — N1831 Chronic kidney disease, stage 3a: Secondary | ICD-10-CM

## 2021-01-11 DIAGNOSIS — I214 Non-ST elevation (NSTEMI) myocardial infarction: Secondary | ICD-10-CM

## 2021-01-11 DIAGNOSIS — K219 Gastro-esophageal reflux disease without esophagitis: Secondary | ICD-10-CM

## 2021-01-11 DIAGNOSIS — E1151 Type 2 diabetes mellitus with diabetic peripheral angiopathy without gangrene: Secondary | ICD-10-CM

## 2021-01-11 DIAGNOSIS — I10 Essential (primary) hypertension: Secondary | ICD-10-CM

## 2021-01-11 DIAGNOSIS — Z7984 Long term (current) use of oral hypoglycemic drugs: Secondary | ICD-10-CM

## 2021-01-11 DIAGNOSIS — I129 Hypertensive chronic kidney disease with stage 1 through stage 4 chronic kidney disease, or unspecified chronic kidney disease: Secondary | ICD-10-CM | POA: Diagnosis not present

## 2021-01-11 MED ORDER — FUROSEMIDE 20 MG PO TABS: 20.0000 mg | ORAL_TABLET | ORAL | 0 refills | Status: DC | PRN

## 2021-01-11 MED ORDER — OMEPRAZOLE 20 MG PO CPDR: 20.0000 mg | DELAYED_RELEASE_CAPSULE | Freq: Every day | ORAL | 0 refills | Status: DC | PRN

## 2021-01-11 MED ORDER — CARVEDILOL 12.5 MG PO TABS: 12.5000 mg | ORAL_TABLET | Freq: Two times a day (BID) | ORAL | 0 refills | Status: DC

## 2021-01-11 MED ORDER — INSULIN GLARGINE 100 UNIT/ML ~~LOC~~ SOLN: 20.0000 [IU] | Freq: Every day | SUBCUTANEOUS | 0 refills | Status: DC

## 2021-01-11 MED ORDER — METFORMIN HCL ER (MOD) 1000 MG PO TB24: 1000.0000 mg | ORAL_TABLET | Freq: Two times a day (BID) | ORAL | 0 refills | Status: DC

## 2021-01-11 MED ORDER — ATORVASTATIN CALCIUM 80 MG PO TABS: 80.0000 mg | ORAL_TABLET | Freq: Every day | ORAL | 0 refills | Status: DC

## 2021-01-11 MED ORDER — TICAGRELOR 90 MG PO TABS: 90.0000 mg | ORAL_TABLET | Freq: Two times a day (BID) | ORAL | 0 refills | Status: DC

## 2021-01-11 MED ORDER — SPIRONOLACTONE 25 MG PO TABS: 12.5000 mg | ORAL_TABLET | Freq: Every day | ORAL | 0 refills | Status: DC

## 2021-01-11 MED ORDER — NITROGLYCERIN 0.4 MG SL SUBL: 0.4000 mg | SUBLINGUAL_TABLET | SUBLINGUAL | 0 refills | Status: DC | PRN

## 2021-01-12 ENCOUNTER — Other Ambulatory Visit: Payer: Self-pay | Admitting: Student

## 2021-01-12 DIAGNOSIS — E1165 Type 2 diabetes mellitus with hyperglycemia: Secondary | ICD-10-CM

## 2021-01-12 DIAGNOSIS — IMO0002 Reserved for concepts with insufficient information to code with codable children: Secondary | ICD-10-CM

## 2021-01-12 MED ORDER — METFORMIN HCL 500 MG PO TABS: 1000.0000 mg | ORAL_TABLET | Freq: Two times a day (BID) | ORAL | 5 refills | Status: DC

## 2021-01-12 NOTE — Assessment & Plan Note (Addendum)
Assessment: Patient had televisit due to being in [place]. She stated her blood pressure was doing good and when asked to elaborate, she stated it was staying lower than 200. She stated she gets readings around 170s/80-90s. She stated it had been few weeks since she checked her blood pressure. She had refill of her Coreg 12.5 mg BID.  Plan: -Continue Current meds Coreg 12.5 mg BID, Spironolactone 12.5 mg daily. -Entresto 49-51 mg BID  -Refilled 30 days supply with instruction to follow up in clinic

## 2021-01-12 NOTE — Telephone Encounter (Signed)
See pharmacy comment regarding rx for  metformin (glumetza (MOD)  '1000mg'$  two times daily 24hr tabs   Pharmacy comment: Alternative Requested:HER INSURANCE WILL NOT WORK IN [place], CAN THIS BE CHANGED TO ER '500MG'$  2 TABS BID?

## 2021-01-12 NOTE — Progress Notes (Signed)
  [facility] Internal Medicine Residency Telephone Encounter Continuity Care Appointment  HPI:  This telephone encounter was created for Ms. Lonia Blood on 01/12/2021 for the following purpose/cc medication refill/follow up.   Past Medical History:  Past Medical History:  Diagnosis Date   Abdominal fullness 09/26/2019   Acute respiratory failure with hypoxia (HCC) 06/02/2017   Anemia 07/17/2013   Arthritis    "hands, shoulders, hips, legs" (05/10/2018)   Arthritis of left hip 08/18/2013   S/p total hip arthroplasty.     CHF (congestive heart failure) ([place])    2014...@ [facility]   Chronic lower back pain    CKD (chronic kidney disease) stage 3, GFR 30-59 ml/min (HCC)    COPD (chronic obstructive pulmonary disease) ([place])    COVID-19 04/13/2020   Healthcare maintenance 06/16/2015   High cholesterol    Hypertension    Hypertensive emergency 08/02/2020   Hypertensive urgency 08/03/2020   Incomplete uterine prolapse 11/23/2018   [facility] 02/27/20- pessary removed per patient request, considering possible surgery, observing for now   Influenza A 06/01/2017   Influenza, pneumonia 06/01/2017   Left flank pain 10/11/2016   Mild depression ([place]) 04/11/2018   04/11/2018 - PHQ-9 8, currently situational depression    Myocardial infarction [facility])    "been told that I've had one; don't know when it would have been" (05/10/2018)   Problem with sexual relationship 10/13/2017   PVD (peripheral vascular disease) ([place])    Shortness of breath 03/12/2019   Type II diabetes mellitus ([facility])    diagnosed 2001     ROS:  Review of system negative unless stated in the problem list or HPI.     Assessment / Plan / Recommendations:  Please see A&P under problem oriented charting for assessment of the patient's acute and chronic medical conditions.  As always, pt is advised that if symptoms worsen or new symptoms arise, they should go to an urgent care facility or to to ER for further evaluation.   Consent and  Medical Decision Making:  Patient discussed with Dr. Jimmye Norman. This is a telephone encounter between Lonia Blood and Idamae Schuller on 01/12/2021 for PCP follow up and medication refill. The visit was conducted with the patient located at home and Idamae Schuller at [facility]. The patient's identity was confirmed using their DOB and current address. The patient has consented to being evaluated through a telephone encounter and understands the associated risks (an examination cannot be done and the patient may need to come in for an appointment) / benefits (allows the patient to remain at home, decreasing exposure to coronavirus). I personally spent 45 minutes on medical discussion.

## 2021-01-12 NOTE — Assessment & Plan Note (Signed)
Assessment: Patient had telephone visit due to being in [place]. She wanted refills on her medications. She stated she was not having any GI symptoms, advised to come in person as well. Will refill her prescription.   Plan: -Refill Prilosec 20 mg for 30 days -In person follow up for more refills

## 2021-01-12 NOTE — Telephone Encounter (Signed)
Visiting in [place], [place], no insulin in 3 weeks, blood pressure 92/56 feels dizzy. Has metformin has been taking as directed. Just doesn't have insulin.  Suggest Relio On NPH 10 units every morning, Send to [facility] on [address] [place],  [place]

## 2021-01-14 MED ORDER — METFORMIN HCL 500 MG PO TABS
1000.0000 mg | ORAL_TABLET | Freq: Two times a day (BID) | ORAL | 0 refills | Status: DC
Start: 2021-01-14 — End: 2021-12-28

## 2021-01-14 MED ORDER — ATORVASTATIN CALCIUM 80 MG PO TABS: 80.0000 mg | ORAL_TABLET | Freq: Every day | ORAL | 0 refills | Status: DC

## 2021-01-14 MED ORDER — OMEPRAZOLE MAGNESIUM 20 MG PO TBEC: 20.0000 mg | DELAYED_RELEASE_TABLET | Freq: Every day | ORAL | 0 refills | Status: DC

## 2021-01-14 NOTE — Assessment & Plan Note (Signed)
Assessment:  Patient has diabetes that is well controlled. She is still getting elevated readings. She is taking her metformin but not her insulin because she is out. She stated the pharmacy didn't give it to her. Patient was evaluated using telephone as she was in [place]. Plan is to refill her meds for 30 days and see her in the clinic for follow up ASAP.  Plan: -Refill meds  -Log glucose readings and bring to clinic for adjustments.  -Follow up in clinic ASAP   Addendum: Patient daughter in law stated patient's insurance was not covered so sent to [facility] for their affordable coverage program

## 2021-01-18 ENCOUNTER — Encounter: Payer: Medicaid Other | Admitting: Internal Medicine

## 2021-01-18 ENCOUNTER — Other Ambulatory Visit (HOSPITAL_COMMUNITY): Payer: Self-pay

## 2021-01-18 ENCOUNTER — Other Ambulatory Visit: Payer: Self-pay | Admitting: *Deleted

## 2021-01-18 DIAGNOSIS — I214 Non-ST elevation (NSTEMI) myocardial infarction: Secondary | ICD-10-CM

## 2021-01-18 DIAGNOSIS — I10 Essential (primary) hypertension: Secondary | ICD-10-CM

## 2021-01-18 DIAGNOSIS — IMO0002 Reserved for concepts with insufficient information to code with codable children: Secondary | ICD-10-CM

## 2021-01-18 DIAGNOSIS — I5042 Chronic combined systolic (congestive) and diastolic (congestive) heart failure: Secondary | ICD-10-CM

## 2021-01-18 DIAGNOSIS — E1151 Type 2 diabetes mellitus with diabetic peripheral angiopathy without gangrene: Secondary | ICD-10-CM

## 2021-01-18 MED ORDER — FUROSEMIDE 20 MG PO TABS
20.0000 mg | ORAL_TABLET | ORAL | 0 refills | Status: DC | PRN
  Filled 2021-01-18: qty 30, 30d supply, fill #0

## 2021-01-18 MED ORDER — TICAGRELOR 90 MG PO TABS
90.0000 mg | ORAL_TABLET | Freq: Two times a day (BID) | ORAL | 0 refills | Status: AC
  Filled 2021-01-18: qty 60, 30d supply, fill #0

## 2021-01-18 MED ORDER — SPIRONOLACTONE 25 MG PO TABS
12.5000 mg | ORAL_TABLET | Freq: Every day | ORAL | 0 refills | Status: DC
  Filled 2021-01-18: qty 15, 30d supply, fill #0

## 2021-01-18 MED ORDER — NITROGLYCERIN 0.4 MG SL SUBL
0.4000 mg | SUBLINGUAL_TABLET | SUBLINGUAL | 0 refills | Status: DC | PRN
  Filled 2021-01-18: qty 25, 5d supply, fill #0

## 2021-01-18 MED ORDER — INSULIN GLARGINE SOLOSTAR 100 UNIT/ML ~~LOC~~ SOPN
20.0000 [IU] | PEN_INJECTOR | Freq: Every day | SUBCUTANEOUS | 0 refills | Status: DC
  Filled 2021-01-18: qty 6, 30d supply, fill #0

## 2021-01-18 MED ORDER — INSULIN GLARGINE 100 UNIT/ML ~~LOC~~ SOLN
20.0000 [IU] | Freq: Every day | SUBCUTANEOUS | 0 refills | Status: DC
  Filled 2021-01-18: qty 10, 50d supply, fill #0

## 2021-01-18 MED ORDER — CARVEDILOL 12.5 MG PO TABS
12.5000 mg | ORAL_TABLET | Freq: Two times a day (BID) | ORAL | 0 refills | Status: DC
Start: 2021-01-18 — End: 2021-03-23
  Filled 2021-01-18: qty 60, 30d supply, fill #0

## 2021-01-18 NOTE — Telephone Encounter (Addendum)
Patient and daughter-in-law are requesting all meds that were sent to [facility] in [place] be resent to [facility] as MCD only works in [place]. Had tele appt on 9/15 to discuss med refills. She was instructed to f/u in person but she arrived 35 minutes late for today's appt and there are no openings. Will route to Attending as PCP is not yet certified with [facility]  Spoke with Lyndee Leo at [facility]. She will get Diona Fanti (OTC), and Entresto ready.   Daughter-in-law p/u atorvastatin, metformin, and omeprazole at [facility] in [place].

## 2021-01-27 NOTE — Progress Notes (Signed)
Internal Medicine Clinic Attending  Case discussed with Dr. Khan  At the time of the visit.  We reviewed the resident's history and exam and pertinent patient test results.  I agree with the assessment, diagnosis, and plan of care documented in the resident's note.  

## 2021-02-04 ENCOUNTER — Inpatient Hospital Stay: Payer: Medicaid Other | Admitting: Adult Health

## 2021-02-05 ENCOUNTER — Other Ambulatory Visit: Payer: Self-pay | Admitting: *Deleted

## 2021-02-05 NOTE — Patient Outreach (Signed)
Care Coordination  02/05/2021  MARIAFERNANDA MYKLEBUST 19-Jan-1956 [id number]  Successful outreach to patient with purpose of completing initial telephone assessment. Patient states she and her husband have moved to [place] to live with their son and daughter in [place]. Patient states she and her husband Olga Millers were patients at the [facility] prior to the move. With patient's permission, her daughter- in -law Solmon Ice joined the conversation and advised patient's [employer] plan terminated on 01/29/21 per a letter the patient received recently. She is voicing concern that both her mother in law and father in law are running low on medications and they will not be able to pay for the medications out of pocket.   After speaking with this RNCM's manager Janalyn Shy, advised Felicia to contact medication assistance programs in the [place] area and sent her the following information to her personal e-mail, [email or web address] [place] Medication Assistance Program (VA MAP) - Disease Prevention [place] Department ([email or web address]) Primary Care at [place] Department ([email or web address])  Also provided [place] with the phone number to [facility] ([place] Program) [phone].    Plan:  Will close referral to the high risk managed medicaid program since patient no longer lives in [place] and her [employer] [place] has terminated.   Kelli Churn RN, CCM, [facility] Care Management Coordinator - Managed [place] High Risk [phone]

## 2021-02-16 ENCOUNTER — Other Ambulatory Visit: Payer: Self-pay

## 2021-02-16 NOTE — Patient Outreach (Signed)
[facility]) Care Management  02/16/2021  [name] 12-24-1955 [id number]   Telephone outreach to patient to obtain mRS was successfully completed. MRS= 0   [facility] Care Management Assistant

## 2021-03-22 ENCOUNTER — Telehealth (HOSPITAL_COMMUNITY): Payer: Self-pay

## 2021-03-22 NOTE — Telephone Encounter (Signed)
Patient is needing a note stating she is disabled in order to get insurance in [place] now.  She is trying to establish care there but needs medical documentation.  She is also needing patient assistance Brilinta or entresto due to cost.

## 2021-03-23 ENCOUNTER — Telehealth (HOSPITAL_COMMUNITY): Payer: Self-pay | Admitting: Pharmacy Technician

## 2021-03-23 ENCOUNTER — Other Ambulatory Visit (HOSPITAL_COMMUNITY): Payer: Self-pay | Admitting: *Deleted

## 2021-03-23 ENCOUNTER — Other Ambulatory Visit (HOSPITAL_COMMUNITY): Payer: Self-pay

## 2021-03-23 DIAGNOSIS — I5042 Chronic combined systolic (congestive) and diastolic (congestive) heart failure: Secondary | ICD-10-CM

## 2021-03-23 DIAGNOSIS — I214 Non-ST elevation (NSTEMI) myocardial infarction: Secondary | ICD-10-CM

## 2021-03-23 DIAGNOSIS — I10 Essential (primary) hypertension: Secondary | ICD-10-CM

## 2021-03-23 MED ORDER — ATORVASTATIN CALCIUM 80 MG PO TABS
80.0000 mg | ORAL_TABLET | Freq: Every day | ORAL | 3 refills | Status: DC
  Filled 2021-03-23: qty 90, 90d supply, fill #0

## 2021-03-23 MED ORDER — FUROSEMIDE 20 MG PO TABS
20.0000 mg | ORAL_TABLET | ORAL | 3 refills | Status: DC | PRN
  Filled 2021-03-23: qty 30, 30d supply, fill #0

## 2021-03-23 MED ORDER — CARVEDILOL 12.5 MG PO TABS
12.5000 mg | ORAL_TABLET | Freq: Two times a day (BID) | ORAL | 3 refills | Status: DC
  Filled 2021-03-23: qty 180, 90d supply, fill #0

## 2021-03-23 MED ORDER — ISOSORBIDE MONONITRATE ER 120 MG PO TB24
120.0000 mg | ORAL_TABLET | Freq: Every day | ORAL | 3 refills | Status: DC
  Filled 2021-03-23: qty 90, 90d supply, fill #0

## 2021-03-23 MED ORDER — SPIRONOLACTONE 25 MG PO TABS
12.5000 mg | ORAL_TABLET | Freq: Every day | ORAL | 3 refills | Status: DC
  Filled 2021-03-23: qty 45, 90d supply, fill #0

## 2021-03-23 NOTE — Telephone Encounter (Signed)
Advanced Heart Failure Patient Advocate Encounter  Spoke with the patient and daughter in law this morning. The patient does not currently have active insurance coverage and needs help with obtaining medications. Emailed [employer] and [employer] applications for [employer] and [employer]. The patient's daughter in law stated that they already had her Medicaid denial and will include that along with POI. Will get a month of samples of both medications.   The patient does not have transportation currently. Will set up HF fund medications to be sent from [facility] outpatient to her.

## 2021-03-23 NOTE — Telephone Encounter (Signed)
[name], when I spoke with her and the daughter in law this morning, they requested a return call about the documentation that shows that the patient is permanently disabled in order for her to move forward with applying for disability.

## 2021-04-02 ENCOUNTER — Telehealth (HOSPITAL_COMMUNITY): Payer: Self-pay | Admitting: *Deleted

## 2021-04-02 NOTE — Telephone Encounter (Signed)
Late Entry.   spoke with patients daughter in law 12/28 she asked that we write a letter stating pt is disabled and can not work. She said letter was needed for [facility] and they had been waiting for this letter for two weeks. Per Adline Potter and Dr.Bensimhon we can not write letter because pts EF has improved therefore she is not disabled from a heart failure stand point. Per daughter in law pt needs a hip replacement and that's whats keeping her from working. Pts pcp wont give her a letter because she hasnt seen them in a while but she cant see them because she does not have insurance. Pts daughter in law said she is trying to get patient a cardiologist in [place] but has to wait until pt has insurance. I spoke with Tammy Sours about pt applying for medicaid she said that patient did not need a letter from our office stating she was disabled. Pt should submit application for medicaid. I asked had the application been submitted daughter in law told me no because pt needed her birth certificate they were waiting for it to come in the mail.  I advised her to apply once she received the birth certificate. I asked was patient going to continue to be seen in our office daughter in law said no because of insurance and needing to have care closer to where the patient lives. I spent 43min on the phone with pts family.

## 2021-04-02 NOTE — Telephone Encounter (Signed)
Samples given to patients daughter.

## 2021-04-07 ENCOUNTER — Telehealth: Payer: Self-pay | Admitting: *Deleted

## 2021-04-07 ENCOUNTER — Encounter: Payer: Medicaid Other | Admitting: Internal Medicine

## 2021-04-12 ENCOUNTER — Telehealth (HOSPITAL_COMMUNITY): Payer: Self-pay

## 2021-04-12 NOTE — Telephone Encounter (Signed)
Received a fax requesting medical records from [facility]. Records were successfully faxed to: [phone] ,which was the number provided.. Medical request form will be scanned into patients chart.

## 2021-12-12 IMAGING — CR DG CHEST 1V
1 series · 1 of 1 positions shown · non-contrast
Comparison: chest x-ray 04/12/2020

CLINICAL DATA: Diabetic ketoacidosis, hypotension

EXAM:
CHEST  1 VIEW

[chest pa]
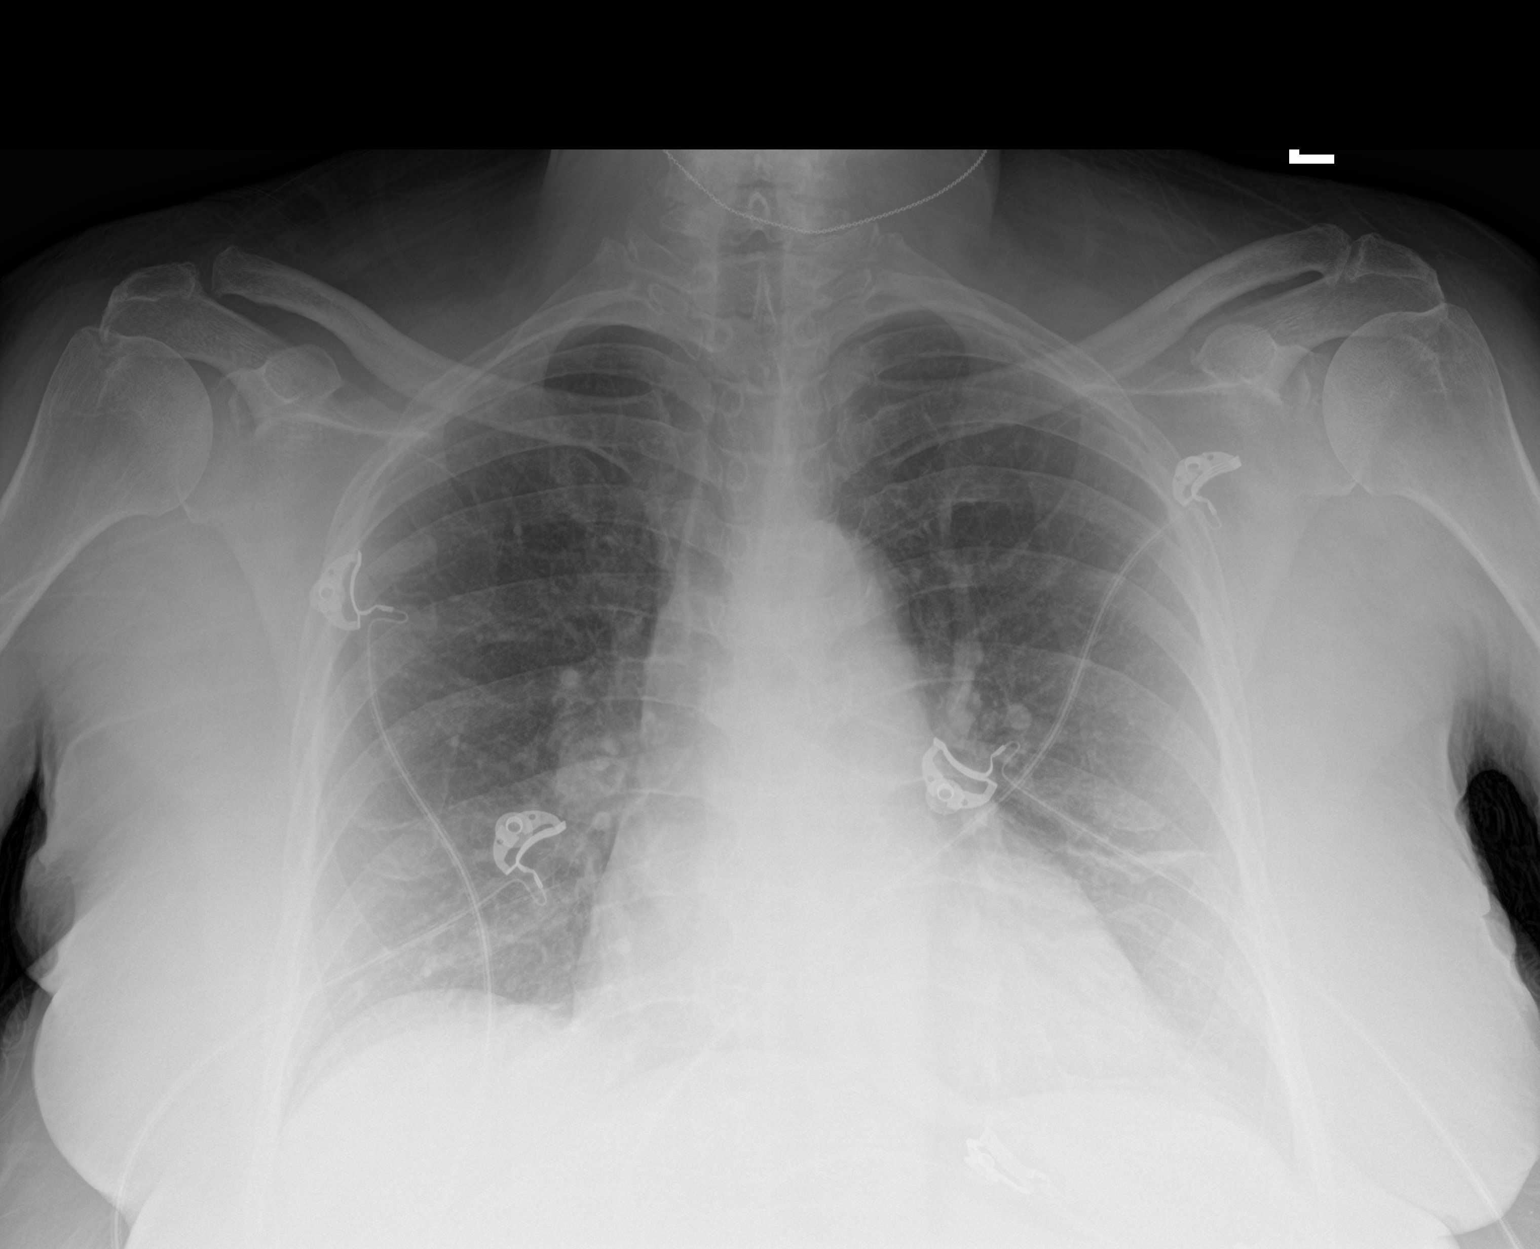

[1 of 1 positions shown; findings below may reference images not displayed]

FINDINGS: The heart size and mediastinal contours are unchanged.

Redemonstration of linear atelectasis versus scarring within the
lingula. No focal consolidation. No pulmonary edema. No pleural
effusion. No pneumothorax.

No acute osseous abnormality.
IMPRESSION: No active disease.

## 2021-12-23 ENCOUNTER — Encounter (HOSPITAL_COMMUNITY): Payer: Self-pay | Admitting: Emergency Medicine

## 2021-12-23 ENCOUNTER — Other Ambulatory Visit: Payer: Self-pay

## 2021-12-23 ENCOUNTER — Emergency Department (HOSPITAL_COMMUNITY): Payer: Self-pay

## 2021-12-23 ENCOUNTER — Observation Stay (HOSPITAL_COMMUNITY)
Admission: EM | Admit: 2021-12-23 | Discharge: 2021-12-28 | Disposition: A | Discharge status: Home / Self Care | Payer: Self-pay | Attending: Internal Medicine | Admitting: Internal Medicine

## 2021-12-23 DIAGNOSIS — K219 Gastro-esophageal reflux disease without esophagitis: Secondary | ICD-10-CM

## 2021-12-23 DIAGNOSIS — Z96642 Presence of left artificial hip joint: Secondary | ICD-10-CM | POA: Insufficient documentation

## 2021-12-23 DIAGNOSIS — I130 Hypertensive heart and chronic kidney disease with heart failure and stage 1 through stage 4 chronic kidney disease, or unspecified chronic kidney disease: Secondary | ICD-10-CM | POA: Insufficient documentation

## 2021-12-23 DIAGNOSIS — Z794 Long term (current) use of insulin: Secondary | ICD-10-CM | POA: Insufficient documentation

## 2021-12-23 DIAGNOSIS — Z8679 Personal history of other diseases of the circulatory system: Secondary | ICD-10-CM | POA: Insufficient documentation

## 2021-12-23 DIAGNOSIS — Z7982 Long term (current) use of aspirin: Secondary | ICD-10-CM | POA: Insufficient documentation

## 2021-12-23 DIAGNOSIS — I10 Essential (primary) hypertension: Secondary | ICD-10-CM | POA: Diagnosis present

## 2021-12-23 DIAGNOSIS — I214 Non-ST elevation (NSTEMI) myocardial infarction: Secondary | ICD-10-CM

## 2021-12-23 DIAGNOSIS — E1122 Type 2 diabetes mellitus with diabetic chronic kidney disease: Secondary | ICD-10-CM | POA: Insufficient documentation

## 2021-12-23 DIAGNOSIS — Z8673 Personal history of transient ischemic attack (TIA), and cerebral infarction without residual deficits: Secondary | ICD-10-CM | POA: Insufficient documentation

## 2021-12-23 DIAGNOSIS — I5042 Chronic combined systolic (congestive) and diastolic (congestive) heart failure: Secondary | ICD-10-CM | POA: Insufficient documentation

## 2021-12-23 DIAGNOSIS — I209 Angina pectoris, unspecified: Secondary | ICD-10-CM

## 2021-12-23 DIAGNOSIS — J449 Chronic obstructive pulmonary disease, unspecified: Secondary | ICD-10-CM | POA: Insufficient documentation

## 2021-12-23 DIAGNOSIS — E785 Hyperlipidemia, unspecified: Secondary | ICD-10-CM | POA: Diagnosis present

## 2021-12-23 DIAGNOSIS — R079 Chest pain, unspecified: Secondary | ICD-10-CM | POA: Diagnosis present

## 2021-12-23 DIAGNOSIS — Z7984 Long term (current) use of oral hypoglycemic drugs: Secondary | ICD-10-CM | POA: Insufficient documentation

## 2021-12-23 DIAGNOSIS — I25119 Atherosclerotic heart disease of native coronary artery with unspecified angina pectoris: Principal | ICD-10-CM | POA: Insufficient documentation

## 2021-12-23 DIAGNOSIS — Z79899 Other long term (current) drug therapy: Secondary | ICD-10-CM | POA: Insufficient documentation

## 2021-12-23 DIAGNOSIS — Z8616 Personal history of COVID-19: Secondary | ICD-10-CM | POA: Insufficient documentation

## 2021-12-23 DIAGNOSIS — Z87891 Personal history of nicotine dependence: Secondary | ICD-10-CM | POA: Insufficient documentation

## 2021-12-23 DIAGNOSIS — Z5900 Homelessness unspecified: Secondary | ICD-10-CM

## 2021-12-23 DIAGNOSIS — N184 Chronic kidney disease, stage 4 (severe): Secondary | ICD-10-CM | POA: Insufficient documentation

## 2021-12-23 DIAGNOSIS — Z66 Do not resuscitate: Secondary | ICD-10-CM | POA: Diagnosis present

## 2021-12-23 LAB — BASIC METABOLIC PANEL
Anion gap: 5 (ref 5–15)
BUN: 25 mg/dL — ABNORMAL HIGH (ref 8–23)
CO2: 23 mmol/L (ref 22–32)
Calcium: 9.1 mg/dL (ref 8.9–10.3)
Chloride: 112 mmol/L — ABNORMAL HIGH (ref 98–111)
Creatinine, Ser: 1.88 mg/dL — ABNORMAL HIGH (ref 0.44–1.00)
GFR, Estimated: 29 mL/min — ABNORMAL LOW (ref >60)
Glucose, Bld: 236 mg/dL — ABNORMAL HIGH (ref 70–99)
Potassium: 3.7 mmol/L (ref 3.5–5.1)
Sodium: 140 mmol/L (ref 135–145)

## 2021-12-23 LAB — CBC
HCT: 38 % (ref 36.0–46.0)
Hemoglobin: 12 g/dL (ref 12.0–15.0)
MCH: 29.5 pg (ref 26.0–34.0)
MCHC: 31.6 g/dL (ref 30.0–36.0)
MCV: 93.4 fL (ref 80.0–100.0)
Platelets: 233 10*3/uL (ref 150–400)
RBC: 4.07 MIL/uL (ref 3.87–5.11)
RDW: 14.8 % (ref 11.5–15.5)
WBC: 5.4 10*3/uL (ref 4.0–10.5)
nRBC: 0 % (ref 0.0–0.2)

## 2021-12-23 LAB — BRAIN NATRIURETIC PEPTIDE: B Natriuretic Peptide: 491 pg/mL — ABNORMAL HIGH (ref 0.0–100.0)

## 2021-12-23 LAB — TROPONIN I (HIGH SENSITIVITY): Troponin I (High Sensitivity): 25 ng/L — ABNORMAL HIGH (ref <18)

## 2021-12-23 NOTE — ED Provider Triage Note (Signed)
Emergency Medicine Provider Triage Evaluation Note  [name] , a 66 y.o. female  was evaluated in triage.  Pt complains of chest pain. Patient was in an argument with her son in law and daughter earlier today.  She began having pain across her precordium and tingling in her right hand which has resolved.  She denies any shortness of breath.  She has a history of heart failure and previous stroke on Plavix.  Patient is also noted some increased swelling in her lower extremities.  She is currently homeless has been sleeping in her daughter's car and states that she thinks that her ankles are swollen because she has not been able to put her legs up. Review of Systems  Positive: Chest pain Negative: Fever  Physical Exam  BP (!) 164/88   Pulse (!) 101   Temp 98.4 F (36.9 C) (Oral)   Resp 16   SpO2 93%  Gen:   Awake, no distress   Resp:  Normal effort  MSK:   Moves extremities without difficulty  Other:  No chest tenderness  Medical Decision Making  Medically screening exam initiated at 10:02 PM.  Appropriate orders placed.  [name] was informed that the remainder of the evaluation will be completed by another provider, this initial triage assessment does not replace that evaluation, and the importance of remaining in the ED until their evaluation is complete.  Work-up initiated   Margarita Mail, PA-C 12/23/21 2207

## 2021-12-23 NOTE — ED Triage Notes (Signed)
Pt reported to ED with c/o chest pain and tightness and shortness of breath after having heated conversation with family today. Endorses hx of COPD but has not taken any medications for the same in several months d/t insurance issues.

## 2021-12-24 ENCOUNTER — Encounter (HOSPITAL_COMMUNITY): Payer: Self-pay | Admitting: Internal Medicine

## 2021-12-24 DIAGNOSIS — Z66 Do not resuscitate: Secondary | ICD-10-CM | POA: Diagnosis present

## 2021-12-24 DIAGNOSIS — I5042 Chronic combined systolic (congestive) and diastolic (congestive) heart failure: Secondary | ICD-10-CM

## 2021-12-24 DIAGNOSIS — Z5900 Homelessness unspecified: Secondary | ICD-10-CM

## 2021-12-24 DIAGNOSIS — I10 Essential (primary) hypertension: Secondary | ICD-10-CM

## 2021-12-24 DIAGNOSIS — Z8673 Personal history of transient ischemic attack (TIA), and cerebral infarction without residual deficits: Secondary | ICD-10-CM

## 2021-12-24 DIAGNOSIS — I255 Ischemic cardiomyopathy: Secondary | ICD-10-CM

## 2021-12-24 DIAGNOSIS — R079 Chest pain, unspecified: Secondary | ICD-10-CM | POA: Diagnosis present

## 2021-12-24 DIAGNOSIS — E785 Hyperlipidemia, unspecified: Secondary | ICD-10-CM | POA: Diagnosis present

## 2021-12-24 LAB — TROPONIN I (HIGH SENSITIVITY)
Troponin I (High Sensitivity): 47 ng/L — ABNORMAL HIGH (ref <18)
Troponin I (High Sensitivity): 50 ng/L — ABNORMAL HIGH (ref <18)
Troponin I (High Sensitivity): 45 ng/L — ABNORMAL HIGH (ref <18)

## 2021-12-24 LAB — HIV ANTIBODY (ROUTINE TESTING W REFLEX): HIV Screen 4th Generation wRfx: NONREACTIVE

## 2021-12-24 LAB — CBG MONITORING, ED
Glucose-Capillary: 140 mg/dL — ABNORMAL HIGH (ref 70–99)
Glucose-Capillary: 146 mg/dL — ABNORMAL HIGH (ref 70–99)

## 2021-12-24 LAB — HEMOGLOBIN A1C
Hgb A1c MFr Bld: 8.8 % — ABNORMAL HIGH (ref 4.8–5.6)
Mean Plasma Glucose: 205.86 mg/dL

## 2021-12-24 LAB — GLUCOSE, CAPILLARY: Glucose-Capillary: 180 mg/dL — ABNORMAL HIGH (ref 70–99)

## 2021-12-24 MED ORDER — INSULIN GLARGINE-YFGN 100 UNIT/ML ~~LOC~~ SOLN
20.0000 [IU] | Freq: Every day | SUBCUTANEOUS | Status: DC
  Administered 2021-12-24 – 2021-12-28 (×5): 20 [IU] via SUBCUTANEOUS
  Filled 2021-12-24 (×5): qty 0.2

## 2021-12-24 MED ORDER — INSULIN ASPART 100 UNIT/ML IJ SOLN
0.0000 [IU] | Freq: Three times a day (TID) | INTRAMUSCULAR | Status: DC
  Administered 2021-12-24 (×2): 2 [IU] via SUBCUTANEOUS
  Administered 2021-12-25 (×2): 3 [IU] via SUBCUTANEOUS
  Administered 2021-12-25 – 2021-12-27 (×5): 2 [IU] via SUBCUTANEOUS

## 2021-12-24 MED ORDER — METHOCARBAMOL 1000 MG/10ML IJ SOLN: 500.0000 mg | Freq: Four times a day (QID) | INTRAMUSCULAR | Status: DC | PRN

## 2021-12-24 MED ORDER — ACETAMINOPHEN 325 MG PO TABS
650.0000 mg | ORAL_TABLET | ORAL | Status: DC | PRN
  Administered 2021-12-25 – 2021-12-28 (×3): 650 mg via ORAL
  Filled 2021-12-24 (×3): qty 2

## 2021-12-24 MED ORDER — ENOXAPARIN SODIUM 40 MG/0.4ML IJ SOSY
40.0000 mg | PREFILLED_SYRINGE | Freq: Every day | INTRAMUSCULAR | Status: DC
Start: 2021-12-24 — End: 2021-12-28
  Administered 2021-12-24 – 2021-12-28 (×5): 40 mg via SUBCUTANEOUS
  Filled 2021-12-24 (×5): qty 0.4

## 2021-12-24 MED ORDER — BASAGLAR KWIKPEN 100 UNIT/ML ~~LOC~~ SOPN: 26.0000 [IU] | PEN_INJECTOR | Freq: Every day | SUBCUTANEOUS | Status: DC

## 2021-12-24 MED ORDER — ISOSORBIDE MONONITRATE ER 60 MG PO TB24
120.0000 mg | ORAL_TABLET | Freq: Every day | ORAL | Status: DC
  Administered 2021-12-24 – 2021-12-28 (×5): 120 mg via ORAL
  Filled 2021-12-24 (×3): qty 2
  Filled 2021-12-24: qty 4
  Filled 2021-12-24: qty 2

## 2021-12-24 MED ORDER — CARVEDILOL 12.5 MG PO TABS
12.5000 mg | ORAL_TABLET | Freq: Two times a day (BID) | ORAL | Status: DC
  Administered 2021-12-24 – 2021-12-28 (×7): 12.5 mg via ORAL
  Filled 2021-12-24 (×7): qty 1

## 2021-12-24 MED ORDER — INSULIN GLARGINE SOLOSTAR 100 UNIT/ML ~~LOC~~ SOPN: 20.0000 [IU] | PEN_INJECTOR | Freq: Every day | SUBCUTANEOUS | Status: DC

## 2021-12-24 MED ORDER — ASPIRIN 81 MG PO CHEW
324.0000 mg | CHEWABLE_TABLET | Freq: Once | ORAL | Status: AC
Start: 2021-12-24 — End: 2021-12-24
  Administered 2021-12-24: 324 mg via ORAL
  Filled 2021-12-24: qty 4

## 2021-12-24 MED ORDER — ASPIRIN 81 MG PO TBEC
81.0000 mg | DELAYED_RELEASE_TABLET | Freq: Every day | ORAL | Status: DC
  Administered 2021-12-25 – 2021-12-28 (×4): 81 mg via ORAL
  Filled 2021-12-24 (×5): qty 1

## 2021-12-24 MED ORDER — ATORVASTATIN CALCIUM 80 MG PO TABS
80.0000 mg | ORAL_TABLET | Freq: Every day | ORAL | Status: DC
  Administered 2021-12-24 – 2021-12-28 (×5): 80 mg via ORAL
  Filled 2021-12-24 (×2): qty 1
  Filled 2021-12-24: qty 2
  Filled 2021-12-24 (×2): qty 1

## 2021-12-24 MED ORDER — INSULIN ASPART 100 UNIT/ML IJ SOLN
0.0000 [IU] | Freq: Every day | INTRAMUSCULAR | Status: DC
  Administered 2021-12-26: 2 [IU] via SUBCUTANEOUS

## 2021-12-24 MED ORDER — FUROSEMIDE 10 MG/ML IJ SOLN
20.0000 mg | Freq: Once | INTRAMUSCULAR | Status: AC
  Administered 2021-12-24: 20 mg via INTRAVENOUS
  Filled 2021-12-24: qty 2

## 2021-12-24 MED ORDER — ONDANSETRON HCL 4 MG/2ML IJ SOLN: 4.0000 mg | Freq: Four times a day (QID) | INTRAMUSCULAR | Status: DC | PRN

## 2021-12-24 NOTE — ED Provider Notes (Signed)
[facility] EMERGENCY DEPARTMENT Provider Note  CSN: [id number] Arrival date & time: 12/23/21 2103  Chief Complaint(s) Chest Pain  HPI [name] is a 66 y.o. female with a past medical history listed below including hypertension, hyperlipidemia, diabetes, CAD involving multiple vessels noted on catheterization in February 2022, congestive heart failure with a last EF of 45 to 50% who presents to the emergency department with substernal chest pain that began last night while having an argument with her daughter.  It was pressure-like.  Radiated to the back.  Exertional.  Associated with shortness of breath, and nausea.  Pain was similar to chest pain felt last year with NSTEMI.  She denies any recent fevers or cough.  No congestion.  No abdominal pain.  Pain subsided shortly after arriving.  Currently she is chest pain-free.  Of note patient reports that she has been out of all her medications since moving back down from [place] a few months ago.  States that she is homeless and is staying in her daughter's car.   Chest Pain   Past Medical History Past Medical History:  Diagnosis Date   Abdominal fullness 09/26/2019   Acute respiratory failure with hypoxia (HCC) 06/02/2017   Anemia 07/17/2013   Arthritis    "hands, shoulders, hips, legs" (05/10/2018)   Arthritis of left hip 08/18/2013   S/p total hip arthroplasty.     CHF (congestive heart failure) ([place])    2014...@ [facility]   Chronic lower back pain    CKD (chronic kidney disease) stage 3, GFR 30-59 ml/min (HCC)    COPD (chronic obstructive pulmonary disease) ([place])    COVID-19 04/13/2020   Healthcare maintenance 06/16/2015   High cholesterol    Hypertension    Hypertensive emergency 08/02/2020   Hypertensive urgency 08/03/2020   Incomplete uterine prolapse 11/23/2018   [facility] 02/27/20- pessary removed per patient request, considering possible surgery, observing for now   Influenza A 06/01/2017   Influenza,  pneumonia 06/01/2017   Left flank pain 10/11/2016   Mild depression 04/11/2018   04/11/2018 - PHQ-9 8, currently situational depression    Myocardial infarction [facility])    "been told that I've had one; don't know when it would have been" (05/10/2018)   Problem with sexual relationship 10/13/2017   PVD (peripheral vascular disease) ([place])    Shortness of breath 03/12/2019   Type II diabetes mellitus ([place])    diagnosed 2001   Patient Active Problem List   Diagnosis Date Noted   Stroke (cerebrum) ([place]) 11/07/2020   NSTEMI (non-ST elevated myocardial infarction) ([place]) 06/06/2020   Musculoskeletal neck pain 02/17/2020   GERD (gastroesophageal reflux disease) 11/14/2018   Nonischemic cardiomyopathy ([place]) 08/31/2017   Pain in toes of both feet 01/26/2017   Bilateral carotid artery disease ([place]) 11/10/2016   Seasonal allergies 06/30/2016   Health care maintenance 06/16/2015   Nonproliferative diabetic retinopathy associated with type 2 diabetes mellitus ([place]) 06/15/2015   Osteoarthritis of right hip 06/09/2015   Type II diabetes mellitus with stage 3 chronic kidney disease ([place]) 07/17/2013   Chronic combined systolic and diastolic CHF, NYHA class 2 ([place]) 07/07/2013   CKD (chronic kidney disease) stage 3, GFR 30-59 ml/min ([place]) 11/22/2012   HTN (hypertension) 11/08/2012   Home Medication(s) Prior to Admission medications   Medication Sig Start Date End Date Taking? Authorizing Provider  acetaminophen (TYLENOL) 500 MG tablet Take 1,000 mg by mouth every 6 (six) hours as needed for mild pain, fever or headache.  [provider]  albuterol (VENTOLIN HFA) 108 (90 Base) MCG/ACT inhaler INHALE 2 PUFFS INTO THE LUNGS EVERY 6 (SIX) HOURS AS NEEDED FOR WHEEZING OR SHORTNESS OF BREATH. Patient not taking: Reported on 02/05/2021 06/24/20   Cato Mulligan, MD  aspirin 81 MG EC tablet Take 1 tablet (81 mg total) by mouth daily. Swallow whole. 11/11/20   Metzger-Cihelka, York Cerise, NP  atorvastatin  (LIPITOR) 80 MG tablet Take 1 tablet by mouth daily. 03/23/21 06/22/21  Bensimhon, Shaune Pascal, MD  carvedilol (COREG) 12.5 MG tablet Take 1 tablet by mouth 2 times daily with a meal. 03/23/21 06/22/21  Bensimhon, Shaune Pascal, MD  furosemide (LASIX) 20 MG tablet Take 1 tablet by mouth as needed for fluid or edema. 03/23/21   Bensimhon, Shaune Pascal, MD  Insulin Glargine Solostar (LANTUS) 100 UNIT/ML Solostar Pen Inject 20 Units into the skin daily. 01/18/21   Velna Ochs, MD  isosorbide mononitrate (IMDUR) 120 MG 24 hr tablet Take 1 tablet by mouth daily. 03/23/21   Bensimhon, Shaune Pascal, MD  metFORMIN (GLUCOPHAGE) 500 MG tablet Take 2 tablets (1,000 mg total) by mouth 2 (two) times daily with a meal. 01/14/21 02/13/21  Idamae Schuller, MD  Multiple Vitamins-Minerals (MULTIVITAMIN GUMMIES ADULT) CHEW Chew 2 tablets by mouth daily.    [provider]  nitroGLYCERIN (NITROSTAT) 0.4 MG SL tablet Place 1 tablet (0.4 mg total) under the tongue every 5 (five) minutes as needed for chest pain. 01/18/21 02/17/21  Velna Ochs, MD  omeprazole (PRILOSEC OTC) 20 MG tablet Take 1 tablet (20 mg total) by mouth daily. 01/14/21 02/13/21  Idamae Schuller, MD  omeprazole (PRILOSEC) 20 MG capsule Take 1 capsule (20 mg total) by mouth daily as needed (hearburn). 01/11/21 02/10/21  Idamae Schuller, MD  spironolactone (ALDACTONE) 25 MG tablet Take 1/2 tablet by mouth daily. 03/23/21 06/22/21  Bensimhon, Shaune Pascal, MD  traMADol (ULTRAM) 50 MG tablet Take 1 tablet (50 mg total) by mouth daily as needed for severe pain. 08/27/20   Axel Filler, MD                                                                                                                                    Allergies Lisinopril and Plavix [clopidogrel bisulfate]  Review of Systems Review of Systems  Cardiovascular:  Positive for chest pain.   As noted in HPI  Physical Exam Vital Signs  I have reviewed the triage vital signs BP (!) 168/96   Pulse  85   Temp 98.6 F (37 C) (Oral)   Resp (!) 24   SpO2 98%   Physical Exam Vitals reviewed.  Constitutional:      General: She is not in acute distress.    Appearance: She is well-developed. She is not diaphoretic.  HENT:     Head: Normocephalic and atraumatic.     Nose: Nose normal.  Eyes:     General: No scleral icterus.  Right eye: No discharge.        Left eye: No discharge.     Conjunctiva/sclera: Conjunctivae normal.     Pupils: Pupils are equal, round, and reactive to light.  Cardiovascular:     Rate and Rhythm: Normal rate and regular rhythm.     Heart sounds: No murmur heard.    No friction rub. No gallop.  Pulmonary:     Effort: Pulmonary effort is normal. No respiratory distress.     Breath sounds: Normal breath sounds. No stridor. No rales.  Abdominal:     General: There is no distension.     Palpations: Abdomen is soft.     Tenderness: There is no abdominal tenderness.  Musculoskeletal:        General: No tenderness.     Cervical back: Normal range of motion and neck supple.     Right lower leg: 1+ Pitting Edema present.     Left lower leg: 1+ Pitting Edema present.  Skin:    General: Skin is warm and dry.     Findings: No erythema or rash.  Neurological:     Mental Status: She is alert and oriented to person, place, and time.     ED Results and Treatments Labs (all labs ordered are listed, but only abnormal results are displayed) Labs Reviewed  BASIC METABOLIC PANEL - Abnormal; Notable for the following components:      Result Value   Chloride 112 (*)    Glucose, Bld 236 (*)    BUN 25 (*)    Creatinine, Ser 1.88 (*)    GFR, Estimated 29 (*)    All other components within normal limits  BRAIN NATRIURETIC PEPTIDE - Abnormal; Notable for the following components:   B Natriuretic Peptide 491.0 (*)    All other components within normal limits  TROPONIN I (HIGH SENSITIVITY) - Abnormal; Notable for the following components:   Troponin I (High  Sensitivity) 25 (*)    All other components within normal limits  TROPONIN I (HIGH SENSITIVITY) - Abnormal; Notable for the following components:   Troponin I (High Sensitivity) 47 (*)    All other components within normal limits  CBC                                                                                                                         EKG  EKG Interpretation  Date/Time:  Thursday December 23 2021 21:52:32 EDT Ventricular Rate:  96 PR Interval:  168 QRS Duration: 154 QT Interval:  416 QTC Calculation: 525 R Axis:   -21 Text Interpretation: Normal sinus rhythm Left bundle branch block Abnormal ECG When compared with ECG of 07-Nov-2020 22:55, PREVIOUS ECG IS PRESENT No significant change was found Confirmed by Addison Lank [id number]) on 12/24/2021 4:29:48 AM       Radiology DG Chest 2 View  Result Date: 12/23/2021 CLINICAL DATA:  Dyspnea EXAM: CHEST - 2 VIEW COMPARISON:  08/02/2020 FINDINGS: The lungs are symmetrically  well expanded. Stable parenchymal scarring within the left mid lung zone. Discoid atelectasis within the right middle lobe. No superimposed confluent pulmonary infiltrate. No pneumothorax or pleural effusion. Cardiac size is within normal limits. Pulmonary vascularity is normal. No acute bone abnormality. IMPRESSION: No evidence of acute cardiopulmonary disease. Electronically Signed   By: Fidela Salisbury M.D.   On: 12/23/2021 22:56    Medications Ordered in ED Medications  aspirin chewable tablet 324 mg (has no administration in time range)  furosemide (LASIX) injection 20 mg (has no administration in time range)                                                                                                                                     Procedures Procedures  (including critical care time)  Medical Decision Making / ED Course   Medical Decision Making Amount and/or Complexity of Data Reviewed External Data Reviewed: radiology.    Details:  Cath and ECHO from last year noted in HPI  Labs: ordered. Decision-making details documented in ED Course. Radiology: ordered and independent interpretation performed. Decision-making details documented in ED Course. ECG/medicine tests: ordered and independent interpretation performed. Decision-making details documented in ED Course.  Risk OTC drugs. Decision regarding hospitalization.    Chest pain concerning for ACS.  Patient has evidence of mild volume overload with peripheral edema on exam.  Also considering heart failure.  Less suspicious for pulmonary embolism. We will assess for pneumonia, pneumothorax or pleural effusions.  EKG with left bundle branch block without evidence of S[place] criteria or acute changes. Troponin slightly elevated at 25.  Second troponin trended up 20 points. Patient given 324 of aspirin. Metabolic panel notable for hyperglycemia without evidence of DKA.  Baseline renal function. BNP elevated close to 500.  Chest x-ray without evidence of pneumonia, pneumothorax, pulm edema or pleural effusions.  Consulting cardiology to assist in care during patient's admission. Will consult medicine for admission to trend troponin and to management patient's comorbidities.      Final Clinical Impression(s) / ED Diagnoses Final diagnoses:  Angina pectoris ([place])           This chart was dictated using voice recognition software.  Despite best efforts to proofread,  errors can occur which can change the documentation meaning.    Fatima Blank, MD 12/24/21 [phone]

## 2021-12-24 NOTE — H&P (Addendum)
History and Physical    Patient: [name]:[id number] DOB: [birth year] DOA: 12/23/2021 DOS: the patient was seen and examined on 12/24/2021 PCP: Pcp, No  Patient coming from: Homeless - lives with daughter, SIL, and 3 children - sleeping in her car; NOK: Husband, Shavonte Zhao (was in hospice and currently in SNF rehab at [place]), [phone]   Chief Complaint: CP/SOB  HPI: [name] is a 66 y.o. female with medical history significant of chronic pain; stage 4 CKD; COPD; HTN; HLD; and DM presenting with CP/SOB.  She reports that she had an argument with her daughter and new husband.  She was yelling and hollering and afterwards her chest felt very tight.  Dr. Tamala Julian previously gave her NTG for chest tightness to take every 5 minutes.  Her ankles were also swollen.  CP was mostly gone once she got to the hospital, maybe lasted 10 minutes or so.  She didn't take the NTG since it had improved.    ER Course:  Carryover, per Dr. Myna Hidalgo:  66 yr old female with COPD, CKD IV, CAD, and chronic combined CHF who presents with chest pain, SOB, and diaphoresis after an argument with family. Pain was resolving prior to arrival. She has chronic LBBB on EKG. CXR negative. Troponin was 25 and then 47. ED treated her with ASA 324 mg and IV Lasix.      Review of Systems: As mentioned in the history of present illness. All other systems reviewed and are negative. Past Medical History:  Diagnosis Date   Anemia 07/17/2013   Arthritis    "hands, shoulders, hips, legs" (05/10/2018)   Arthritis of left hip 08/18/2013   S/p total hip arthroplasty.     CHF (congestive heart failure) ([place])    2014...@ [facility]   Chronic lower back pain    CKD (chronic kidney disease) stage 3, GFR 30-59 ml/min (HCC)    COPD (chronic obstructive pulmonary disease) ([place])    COVID-19 04/13/2020   High cholesterol    Hypertension    Hypertensive emergency 08/02/2020   Incomplete uterine prolapse 11/23/2018   [facility] 02/27/20- pessary removed per patient request, considering possible surgery, observing for now   Influenza, pneumonia 06/01/2017   Mild depression 04/11/2018   04/11/2018 - PHQ-9 8, currently situational depression    PVD (peripheral vascular disease) ([place])    Type II diabetes mellitus ([place])    diagnosed 2001   Past Surgical History:  Procedure Laterality Date   ABDOMINAL ANGIOGRAM  12/28/2012   ABDOMINAL ANGIOGRAM N/A 12/29/2011   Procedure: ABDOMINAL ANGIOGRAM;  Surgeon: Conrad Comstock, MD;  Location: [facility] CATH LAB;  Service: Cardiovascular;  Laterality: N/A;   ABDOMINAL AORTAGRAM N/A 12/21/2012   Procedure: ABDOMINAL Maxcine Ham;  Surgeon: Elam Dutch, MD;  Location: [facility] CATH LAB;  Service: Cardiovascular;  Laterality: N/A;   CORONARY STENT INTERVENTION N/A 06/11/2020   Procedure: CORONARY STENT INTERVENTION;  Surgeon: [name], MD;  Location: [place] [facility] CV LAB;  Service: Cardiovascular;  Laterality: N/A;   EYE SURGERY Bilateral ~ 2018   "laser; related to my diabetes"   JOINT REPLACEMENT     PERIPHERAL VASCULAR CATHETERIZATION     "had to have some veins unclogged cause I was in pain when I walked" (05/10/2018)   RIGHT/LEFT HEART CATH AND CORONARY ANGIOGRAPHY N/A 04/30/2018   Procedure: RIGHT/LEFT HEART CATH AND CORONARY ANGIOGRAPHY;  Surgeon: [name], MD;  Location: [place] CV LAB;  Service: Cardiovascular;  Laterality: N/A;  RIGHT/LEFT HEART CATH AND CORONARY ANGIOGRAPHY N/A 06/09/2020   Procedure: RIGHT/LEFT HEART CATH AND CORONARY ANGIOGRAPHY;  Surgeon: [name], MD;  Location: [place] CV LAB;  Service: Cardiovascular;  Laterality: N/A;   TOTAL HIP ARTHROPLASTY Left 08/19/2013   Procedure: TOTAL HIP ARTHROPLASTY;  Surgeon: Kerin Salen, MD;  Location: [place];  Service: Orthopedics;  Laterality: Left;   TUBAL LIGATION     Social History:  reports that she quit smoking about 34 years ago. Her smoking use included cigarettes. She has never used smokeless  tobacco. She reports that she does not currently use alcohol. She reports that she does not currently use drugs.  Allergies  Allergen Reactions   Lisinopril Cough   Plavix [Clopidogrel Bisulfate] Rash    Family History  Problem Relation Age of Onset   Diabetes Mother     Prior to Admission medications   Medication Sig Start Date End Date Taking? Authorizing Provider  acetaminophen (TYLENOL) 500 MG tablet Take 1,000 mg by mouth every 6 (six) hours as needed for mild pain, fever or headache.    [provider]  albuterol (VENTOLIN HFA) 108 (90 Base) MCG/ACT inhaler INHALE 2 PUFFS INTO THE LUNGS EVERY 6 (SIX) HOURS AS NEEDED FOR WHEEZING OR SHORTNESS OF BREATH. Patient not taking: Reported on 02/05/2021 06/24/20   Cato Mulligan, MD  aspirin 81 MG EC tablet Take 1 tablet (81 mg total) by mouth daily. Swallow whole. 11/11/20   Metzger-Cihelka, York Cerise, NP  atorvastatin (LIPITOR) 80 MG tablet Take 1 tablet by mouth daily. 03/23/21 06/22/21  Bensimhon, Shaune Pascal, MD  carvedilol (COREG) 12.5 MG tablet Take 1 tablet by mouth 2 times daily with a meal. 03/23/21 06/22/21  Bensimhon, Shaune Pascal, MD  furosemide (LASIX) 20 MG tablet Take 1 tablet by mouth as needed for fluid or edema. 03/23/21   Bensimhon, Shaune Pascal, MD  Insulin Glargine Solostar (LANTUS) 100 UNIT/ML Solostar Pen Inject 20 Units into the skin daily. 01/18/21   Velna Ochs, MD  isosorbide mononitrate (IMDUR) 120 MG 24 hr tablet Take 1 tablet by mouth daily. 03/23/21   Bensimhon, Shaune Pascal, MD  metFORMIN (GLUCOPHAGE) 500 MG tablet Take 2 tablets (1,000 mg total) by mouth 2 (two) times daily with a meal. 01/14/21 02/13/21  Idamae Schuller, MD  Multiple Vitamins-Minerals (MULTIVITAMIN GUMMIES ADULT) CHEW Chew 2 tablets by mouth daily.    [provider]  nitroGLYCERIN (NITROSTAT) 0.4 MG SL tablet Place 1 tablet (0.4 mg total) under the tongue every 5 (five) minutes as needed for chest pain. 01/18/21 02/17/21  Velna Ochs, MD   omeprazole (PRILOSEC OTC) 20 MG tablet Take 1 tablet (20 mg total) by mouth daily. 01/14/21 02/13/21  Idamae Schuller, MD  omeprazole (PRILOSEC) 20 MG capsule Take 1 capsule (20 mg total) by mouth daily as needed (hearburn). 01/11/21 02/10/21  Idamae Schuller, MD  spironolactone (ALDACTONE) 25 MG tablet Take 1/2 tablet by mouth daily. 03/23/21 06/22/21  Bensimhon, Shaune Pascal, MD  traMADol (ULTRAM) 50 MG tablet Take 1 tablet (50 mg total) by mouth daily as needed for severe pain. 08/27/20   Axel Filler, MD    Physical Exam: Vitals:   12/24/21 0800 12/24/21 0815 12/24/21 0816 12/24/21 0900  BP: (!) 162/114 (!) 175/90  (!) 158/89  Pulse: 78 82  73  Resp: 17 (!) 24  19  Temp:  97.8 F (36.6 C)    TempSrc:  Oral    SpO2: 96% 98% 97% 98%   General:  Appears calm  and comfortable and is in NAD Eyes:  PERRL, EOMI, normal lids, iris ENT:  grossly normal hearing, lips & tongue, mmm Neck:  no LAD, masses or thyromegaly Cardiovascular:  RRR, no m/r/g. 1+ LE edema.  Respiratory:   CTA bilaterally with no wheezes/rales/rhonchi.  Normal respiratory effort. Abdomen:  soft, NT, ND Skin:  no rash or induration seen on limited exam Musculoskeletal:  grossly normal tone BUE/BLE, good ROM, no bony abnormality Psychiatric:  blunted mood and affect, speech fluent and appropriate, AOx3 Neurologic:  CN 2-12 grossly intact, moves all extremities in coordinated fashion   Radiological Exams on Admission: Independently reviewed - see discussion in A/P where applicable  DG Chest 2 View  Result Date: 12/23/2021 CLINICAL DATA:  Dyspnea EXAM: CHEST - 2 VIEW COMPARISON:  08/02/2020 FINDINGS: The lungs are symmetrically well expanded. Stable parenchymal scarring within the left mid lung zone. Discoid atelectasis within the right middle lobe. No superimposed confluent pulmonary infiltrate. No pneumothorax or pleural effusion. Cardiac size is within normal limits. Pulmonary vascularity is normal. No acute bone  abnormality. IMPRESSION: No evidence of acute cardiopulmonary disease. Electronically Signed   By: Fidela Salisbury M.D.   On: 12/23/2021 22:56    EKG: Independently reviewed.  NSR with rate 96; LBBB; nonspecific ST changes with no evidence of acute ischemia   Labs on Admission: I have personally reviewed the available labs and imaging studies at the time of the admission.  Pertinent labs:    Glucose 236 BUN 25/Creatinine 1.88/GFR 29 BNP 491 HS troponin 25, 47 Normal CBC   Assessment and Plan: Principal Problem:   Chest pain Active Problems:   HTN (hypertension)   Chronic combined systolic and diastolic CHF, NYHA class 2 (HCC)   Diabetes mellitus with stage 4 chronic kidney disease, with long-term current use of insulin (HCC)   Dyslipidemia   DNR (do not resuscitate)   History of CVA (cerebrovascular accident)   Homelessness    Chest pain -Patient with h/o CAD and prior stent presenting with substernal chest pressure that came on with emotional stress, resolved spontaneously -2/3 typical symptoms suggestive of atypical cardiac chest pain.  -CXR unremarkable.   -Initial cardiac HS troponin minimally elevated with mildly + delta. -EKG not indicative of acute ischemia.   -Will plan to place in observation status on telemetry to rule out ACS by overnight observation.  -Repeat HS troponin -Continue ASA 81 mg daily and Imdur -Risk factor stratification with HgbA1c and FLP; will also check TSH and UDS -Cardiology consultation requested -Will plan to start Heparin drip if enzymes are increasingly positive and/or chest pain recurs -Will check echo  Chronic combined CHF -Prior echo was on 11/08/20 and showed EF 45-50% and grade 2 diastolic dysfunction; will repeat -She does not have apparent decompensation, although she does report mild LE edema that may be dependent in nature from her recent homelessness/living in a car -She is supposed to be taking Lasix, Entresto, and Aldactone  but has no current rx for these medications; will hold for now given renal dysfunction and lack of current apparent volume overload  HTN -Continue carvedilol -Hold Entresto -Will also add prn hydralazine  HLD -Continue Lipitor -Check lipids  DM -Check A1c -Hold metformin -Continue glargine -Will cover with moderate-scale SSI for now  Stage 4 CKD -Appears to be stable at this time but there could be an acute component in play -Will recheck BMP in AM -Attempt to avoid nephrotoxic medications  H/o CVA -Admitted from 7/9-12, treated with tPA for  R MCA stroke -No apparent deficits -She was discharged on ASA and Brilinta but does not appear to still be taking Brilinta -Noted to have R ICA stenosis and referred to vascular surgery, but there is no evidence that she followed up  COPD -She does not appear to be taking medications for this issue at this time   Homelessness -Her current social situation is likely contributing to her current presentation -She is experiencing a significant amount of psychosocial stress associated with her family's lack of housing -They are currently living in her daughter's care (her plus her daughter's family of 5) -TOC team consult requested  DNR -I have discussed code status with the patient and she would prefer to die a natural death should that situation arise. -She will need a gold out of facility DNR form at the time of discharge     Advance Care Planning:   Code Status: DNR   Consults: Cardiology  DVT Prophylaxis: Lovenox  Family Communication: None present; she is capable of communicating with family at this time  Severity of Illness: The appropriate patient status for this patient is OBSERVATION. Observation status is judged to be reasonable and necessary in order to provide the required intensity of service to ensure the patient's safety. The patient's presenting symptoms, physical exam findings, and initial radiographic and  laboratory data in the context of their medical condition is felt to place them at decreased risk for further clinical deterioration. Furthermore, it is anticipated that the patient will be medically stable for discharge from the hospital within 2 midnights of admission.   Author: Karmen Bongo, MD 12/24/2021 10:25 AM  For on call review [email or web address].

## 2021-12-24 NOTE — Consult Note (Addendum)
Cardiology Consultation:   Patient ID: ELLSIE VIOLETTE MRN: [id number]; DOB: 01/20/1956  Admit date: 12/23/2021 Date of Consult: 12/24/2021  PCP:  [name] No   [facility] Providers Cardiologist:  Sinclair Grooms, MD   Patient Profile:   [name] is a 66 y.o. female with a hx of hypertension, hyperlipidemia, bilateral nonobstructive carotid artery stenosis, COPD, CKD stage IV, CAD, DM 2, CVA 11/18/2020, and PVD who is being seen 12/24/2021 for the evaluation of chest pain at the request of Dr. Lorin Mercy.  History of Present Illness:   [name] with a history of multivessel CAD s/p PCI of mid LAD, poorly controlled DM 2, PAD, carotid artery stenosis, CKD stage IV, LBBB, systolic and diastolic heart failure.  She has been followed by cardiology since 2013 LVEF has been as low as 30 to 35% in 2015 but recovered to near normal in 2018.  Since then she has had fluctuating EF on echocardiograms.  In December 2019 in the setting of systolic heart failure, she was referred for heart catheterization found to have severe multivessel disease that was medically managed.  She was admitted with an NSTEMI 06/2020 with an echo showing an EF of 25 to 30%.  Repeat heart catheterization showed severe three-vessel CAD with 40 to 50% ostial LAD, 85% mid LAD, severe obstruction of the first OM/ramus intermedius vessel, 85% proximal to mid LCx disease with severe diffuse disease in each of the 3 OM branches, and total occlusion of the proximal to mid RCA.  CT surgery was consulted but felt she was not a good surgical candidate due to her recent COVID-19 infection and other comorbid conditions including poorly controlled DM 2.  She underwent PCI of her mid LAD and was started on DAPT.  She followed with [facility] for symptomatic right carotid stenosis.  This was discovered in the setting of CVA 10/2020 -she received tPA for a right MCA stroke.  She was discharged on aspirin and Brilinta.  Echocardiogram at the time of her  stroke showed an LVEF of 45 to 50% and grade 2 diastolic dysfunction.  Previously maintained on Lasix, Entresto, and spironolactone.  Appears she is no longer taking these medications, Brilinta, or ASA.   She presented to [facility] ED with chest pain that started while she was having an argument with her daughter.  Of note she reports she has been out of all of her medications since moving back from [place] a few months ago.  She is homeless and is living in her daughter's car.  Cardiology was consulted for evaluation of chest pain.  HS troponin 47 --> 50 --> 45 EKG with known LBBB A1c 8.8% BNP 491 sCr 1.88 - appears near baseline, although closer to 1.5 in 07/2020  She describes her housing issues. She may be able to go to [place] to live with another son. She had chest pain while arguing with her SIL, now resolved. She did not require nitro. She has not been taking ASA and confirms she has  not been taking medications. She is chest pain free currently. Denies recent issues with hypervolemia.    Past Medical History:  Diagnosis Date   Anemia 07/17/2013   Arthritis    "hands, shoulders, hips, legs" (05/10/2018)   Arthritis of left hip 08/18/2013   S/p total hip arthroplasty.     CHF (congestive heart failure) ([place])    2014...@ [facility]   Chronic lower back pain    CKD (chronic kidney disease) stage 3, GFR  30-59 ml/min (HCC)    COPD (chronic obstructive pulmonary disease) ([place])    COVID-19 04/13/2020   High cholesterol    Hypertension    Hypertensive emergency 08/02/2020   Incomplete uterine prolapse 11/23/2018   [facility] 02/27/20- pessary removed per patient request, considering possible surgery, observing for now   Influenza, pneumonia 06/01/2017   Mild depression 04/11/2018   04/11/2018 - PHQ-9 8, currently situational depression    PVD (peripheral vascular disease) ([place])    Type II diabetes mellitus ([place])    diagnosed 2001    Past Surgical History:  Procedure Laterality Date    ABDOMINAL ANGIOGRAM  12/28/2012   ABDOMINAL ANGIOGRAM N/A 12/29/2011   Procedure: ABDOMINAL ANGIOGRAM;  Surgeon: Conrad Peyton, MD;  Location: [facility] CATH LAB;  Service: Cardiovascular;  Laterality: N/A;   ABDOMINAL AORTAGRAM N/A 12/21/2012   Procedure: ABDOMINAL Maxcine Ham;  Surgeon: Elam Dutch, MD;  Location: [facility] CATH LAB;  Service: Cardiovascular;  Laterality: N/A;   CORONARY STENT INTERVENTION N/A 06/11/2020   Procedure: CORONARY STENT INTERVENTION;  Surgeon: [name], MD;  Location: [place] CV LAB;  Service: Cardiovascular;  Laterality: N/A;   EYE SURGERY Bilateral ~ 2018   "laser; related to my diabetes"   JOINT REPLACEMENT     PERIPHERAL VASCULAR CATHETERIZATION     "had to have some veins unclogged cause I was in pain when I walked" (05/10/2018)   RIGHT/LEFT HEART CATH AND CORONARY ANGIOGRAPHY N/A 04/30/2018   Procedure: RIGHT/LEFT HEART CATH AND CORONARY ANGIOGRAPHY;  Surgeon: [name], MD;  Location: [place] CV LAB;  Service: Cardiovascular;  Laterality: N/A;   RIGHT/LEFT HEART CATH AND CORONARY ANGIOGRAPHY N/A 06/09/2020   Procedure: RIGHT/LEFT HEART CATH AND CORONARY ANGIOGRAPHY;  Surgeon: [name], MD;  Location: [place] CV LAB;  Service: Cardiovascular;  Laterality: N/A;   TOTAL HIP ARTHROPLASTY Left 08/19/2013   Procedure: TOTAL HIP ARTHROPLASTY;  Surgeon: Kerin Salen, MD;  Location: [place];  Service: Orthopedics;  Laterality: Left;   TUBAL LIGATION       Home Medications:  Prior to Admission medications   Medication Sig Start Date End Date Taking? Authorizing Provider  acetaminophen (TYLENOL) 500 MG tablet Take 1,000 mg by mouth every 6 (six) hours as needed for mild pain, fever or headache.   Yes [provider]  Insulin Glargine (BASAGLAR KWIKPEN) 100 UNIT/ML Inject 26 Units into the skin at bedtime.   Yes [provider]  nitroGLYCERIN (NITROSTAT) 0.4 MG SL tablet Place 1 tablet (0.4 mg total) under the tongue every 5 (five) minutes  as needed for chest pain. 01/18/21 12/24/21 Yes Velna Ochs, MD  aspirin 81 MG EC tablet Take 1 tablet (81 mg total) by mouth daily. Swallow whole. Patient not taking: Reported on 12/24/2021 11/11/20   Metzger-Cihelka, York Cerise, NP  atorvastatin (LIPITOR) 80 MG tablet Take 1 tablet by mouth daily. Patient not taking: Reported on 12/24/2021 03/23/21 06/22/21  Bensimhon, Shaune Pascal, MD  carvedilol (COREG) 12.5 MG tablet Take 1 tablet by mouth 2 times daily with a meal. Patient not taking: Reported on 12/24/2021 03/23/21 06/22/21  Bensimhon, Shaune Pascal, MD  furosemide (LASIX) 20 MG tablet Take 1 tablet by mouth as needed for fluid or edema. Patient not taking: Reported on 12/24/2021 03/23/21   Bensimhon, Shaune Pascal, MD  Insulin Glargine Solostar (LANTUS) 100 UNIT/ML Solostar Pen Inject 20 Units into the skin daily. Patient not taking: Reported on 12/24/2021 01/18/21   Velna Ochs, MD  isosorbide mononitrate (IMDUR)  120 MG 24 hr tablet Take 1 tablet by mouth daily. Patient not taking: Reported on 12/24/2021 03/23/21   Bensimhon, Shaune Pascal, MD  metFORMIN (GLUCOPHAGE) 500 MG tablet Take 2 tablets (1,000 mg total) by mouth 2 (two) times daily with a meal. Patient not taking: Reported on 12/24/2021 01/14/21 02/13/21  Idamae Schuller, MD  omeprazole (PRILOSEC) 20 MG capsule Take 1 capsule (20 mg total) by mouth daily as needed ([place]). Patient not taking: Reported on 12/24/2021 01/11/21 02/10/21  Idamae Schuller, MD  sacubitril-valsartan (ENTRESTO) 49-51 MG Take 1 tablet by mouth 2 (two) times daily. Patient not taking: Reported on 12/24/2021    [provider]  spironolactone (ALDACTONE) 25 MG tablet Take 1/2 tablet by mouth daily. Patient not taking: Reported on 12/24/2021 03/23/21 06/22/21  Bensimhon, Shaune Pascal, MD    Inpatient Medications: Scheduled Meds:  aspirin EC  81 mg Oral Daily   atorvastatin  80 mg Oral Daily   carvedilol  12.5 mg Oral BID WC   enoxaparin (LOVENOX) injection  40 mg Subcutaneous  Daily   insulin aspart  0-15 Units Subcutaneous TID WC   insulin aspart  0-5 Units Subcutaneous QHS   insulin glargine-yfgn  20 Units Subcutaneous Daily   isosorbide mononitrate  120 mg Oral Daily   Continuous Infusions:  methocarbamol (ROBAXIN) IV     PRN Meds: acetaminophen, methocarbamol (ROBAXIN) IV, ondansetron (ZOFRAN) IV  Allergies:    Allergies  Allergen Reactions   Lisinopril Cough   Plavix [Clopidogrel Bisulfate] Rash    Social History:   Social History   Socioeconomic History   Marital status: Married    Spouse name: Not on file   Number of children: Not on file   Years of education: 12,college   Highest education level: Not on file  Occupational History   Occupation: retired  Tobacco Use   Smoking status: Former    Years: 7.00    Types: Cigarettes    Quit date: 05/08/1987    Years since quitting: 34.6   Smokeless tobacco: Never   Tobacco comments:    05/10/2018 "smoked when I drank"  Vaping Use   Vaping Use: Never used  Substance and Sexual Activity   Alcohol use: Not Currently    Alcohol/week: 0.0 standard drinks of alcohol    Comment: 05/10/2018 "glass of wine on holidays"   Drug use: Not Currently   Sexual activity: Not Currently  Other Topics Concern   Not on file  Social History Narrative   Not on file   Social Determinants of Health   Financial Resource Strain: Not on file  Food Insecurity: Not on file  Transportation Needs: Not on file  Physical Activity: Not on file  Stress: Not on file  Social Connections: Not on file  Intimate Partner Violence: Not on file    Family History:    Family History  Problem Relation Age of Onset   Diabetes Mother      ROS:  Please see the history of present illness.   All other ROS reviewed and negative.     Physical Exam/Data:   Vitals:   12/24/21 1415 12/24/21 1430 12/24/21 1445 12/24/21 1500  BP: (!) 135/56 (!) 130/58 119/73 (!) 142/69  Pulse: 79 81 83 83  Resp: 20 13 19  (!) 21  Temp:       TempSrc:      SpO2: 97% 95% 96% 98%   No intake or output data in the 24 hours ending 12/24/21 1534    11/07/2020  7:00 PM 11/07/2020    4:46 PM 08/27/2020   10:12 AM  Last 3 Weights  Weight (lbs) 160 lb 11.5 oz 162 lb 14.7 oz 169 lb 6.4 oz  Weight (kg) 72.9 kg 73.9 kg 76.839 kg     There is no height or weight on file to calculate BMI.  General:  Well nourished, well developed, in no acute distress HEENT: normal Neck: no JVD Vascular: No carotid bruits; Distal pulses 2+ bilaterally Cardiac:  normal S1, S2; RRR; no murmur  Lungs:  clear to auscultation bilaterally, no wheezing, rhonchi or rales  Abd: soft, nontender, no hepatomegaly  Ext: no edema Musculoskeletal:  No deformities, BUE and BLE strength normal and equal Skin: warm and dry  Neuro:  CNs 2-12 intact, no focal abnormalities noted Psych:  Normal affect   EKG:  The EKG was personally reviewed and demonstrates:  sinus rhythm with HR 96 and known LBBB Telemetry:  Telemetry was personally reviewed and demonstrates:  sinus rhythm with HR 70s  Relevant CV Studies:  Coronary stent intervention 06/11/20: 80% eccentric mid LAD stenosis with TIMI grade III flow reduced to 0% with TIMI grade III flow using a 15 x 2.5 [place] postdilated to 2.75 mm with high-pressure [place] balloon. No complications including absence of angina and hemodynamic disturbance during balloon inflations.   RECOMMENDATIONS:   Aspirin and Brilinta for 12 months. After 12 months consider long-term P2 Y 12 inhibitor monotherapy either Plavix or reduced dose Brilinta.   Right and left heart cath 06/09/20: Severe diffuse three-vessel coronary artery disease. 40 to 50% ostial LAD, 85% mid LAD. Severe obstruction first obtuse marginal/ramus intermedius. 85% proximal to mid circumflex stenosis.  Severe diffuse disease in each of 3 obtuse marginal branches. Total occlusion of the proximal to mid RCA. LVEDP is normal.  Left ventriculography was not performed because  of renal insufficiency.   RECOMMENDATIONS:   Consider multivessel coronary bypass grafting given diabetes, relatively young age, and LV dysfunction.  Discussed with Dr. Illene Bolus.   Echo 11/08/20: 1. Left ventricular ejection fraction, by estimation, is 45 to 50%. The  left ventricle has mildly decreased function. The left ventricle  demonstrates global hypokinesis. There is moderate concentric left  ventricular hypertrophy. Left ventricular  diastolic parameters are consistent with Grade II diastolic dysfunction  (pseudonormalization). Elevated left atrial pressure. There is very mild  hypokinesis of the left ventricular, basal-mid inferior wall and  inferoseptal wall. There is profound  LBBB-related septal-lateral wall dyssynchrony that accounts for most of  the decrease in LVEF.   2. Right ventricular systolic function is normal. The right ventricular  size is normal. There is mildly elevated pulmonary artery systolic  pressure.   3. Left atrial size was mild to moderately dilated.   4. The mitral valve is degenerative. Mild mitral valve regurgitation. No  evidence of mitral stenosis. Moderate to severe mitral annular  calcification.   5. The aortic valve is tricuspid. There is moderate calcification of the  aortic valve. There is moderate thickening of the aortic valve. Aortic  valve regurgitation is mild. Mild to moderate aortic valve  sclerosis/calcification is present, without any  evidence of aortic stenosis.   6. The inferior vena cava is normal in size with greater than 50%  respiratory variability, suggesting right atrial pressure of 3 mmHg.   Laboratory Data:  High Sensitivity Troponin:   Recent Labs  Lab 12/23/21 2215 12/24/21 0044 12/24/21 0956 12/24/21 1224  TROPONINIHS 25* 47* 50* 45*     Chemistry  Recent Labs  Lab 12/23/21 2215  NA 140  K 3.7  CL 112*  CO2 23  GLUCOSE 236*  BUN 25*  CREATININE 1.88*  CALCIUM 9.1  GFRNONAA 29*  ANIONGAP 5    No  results for input(s): "PROT", "ALBUMIN", "AST", "ALT", "ALKPHOS", "BILITOT" in the last 168 hours. Lipids No results for input(s): "CHOL", "TRIG", "HDL", "LABVLDL", "LDLCALC", "CHOLHDL" in the last 168 hours.  Hematology Recent Labs  Lab 12/23/21 2215  WBC 5.4  RBC 4.07  HGB 12.0  HCT 38.0  MCV 93.4  MCH 29.5  MCHC 31.6  RDW 14.8  PLT 233   Thyroid No results for input(s): "TSH", "FREET4" in the last 168 hours.  BNP Recent Labs  Lab 12/23/21 2215  BNP 491.0*    DDimer No results for input(s): "DDIMER" in the last 168 hours.   Radiology/Studies:  DG Chest 2 View  Result Date: 12/23/2021 CLINICAL DATA:  Dyspnea EXAM: CHEST - 2 VIEW COMPARISON:  08/02/2020 FINDINGS: The lungs are symmetrically well expanded. Stable parenchymal scarring within the left mid lung zone. Discoid atelectasis within the right middle lobe. No superimposed confluent pulmonary infiltrate. No pneumothorax or pleural effusion. Cardiac size is within normal limits. Pulmonary vascularity is normal. No acute bone abnormality. IMPRESSION: No evidence of acute cardiopulmonary disease. Electronically Signed   By: Fidela Salisbury M.D.   On: 12/23/2021 22:56     Assessment and Plan:   Chest pain CAD Known CTO of RCA with left to right collaterals DES-LAD 06/2020 LCX, RI, and OM branch stenosis Hyperlipidemia with LDL less than 70 - turned down for CABG in 2022 due to recent COVID infection and poorly controlled DM2 - She underwent DES to mid LAD and recommended for long-term P2 Y12 inhibitor monotherapy after 12 months of DAPT. - chest pain likely related to stress and exertion, as she was arguing and pushing her SIL - now chest pain free - given her flat CE, EKG, and renal function, will treat medically - will restart antiplatelet therapy - 81 mg ASA - start 30 mg imdur - start 3.125 mg coreg BID - start 80 mg lipitor   Chronic systolic and diastolic heart failure Has been out of medications for several  months Previously maintained on Coreg, Imdur, 49-51 mg Entresto twice daily, 12.5 mg spironolactone, and 20 mg Lasix PRN. - restart medications as above - echo pending   CKD stage IV Unclear what her baseline is now off of medications Will need to watch BMP Hopefully can restart low dose entresto +/- spironolactone   PAD She is previously followed by [facility] with a history of subintimal angioplasty of the left femoral-popliteal artery in 2013, aortogram in 2014 with 90% left SFA stenosis and severe tibial disease along with peroneal artery disease.  Intervention on the left superficial femoral artery was not felt to be beneficial for downstream circulation, no intervention was attempted.   Symptomatic carotid artery stenosis Was seen by [facility] but has not followed up for this Noted in the setting of stroke 10/2020 Would benefit from antiplatelet therapy and statin therapy   DM Hx of CVA COPD Per primary   Risk Assessment/Risk Scores:     TIMI Risk Score for Unstable Angina or Non-ST Elevation MI:   The patient's TIMI risk score is 3, which indicates a 13% risk of all cause mortality, new or recurrent myocardial infarction or need for urgent revascularization in the next 14 days.  New York Heart Association (NYHA) Functional Class NYHA Class  II        For questions or updates, please contact [place] Please consult [email or web address] for contact info under    Signed, Ledora Bottcher, [place]  12/24/2021 3:34 PM  Agree with note by Sunday Spillers  We are asked to see this unfortunate 66 year old African-American female with ischemic cardiomyopathy.  Unfortunately her social situation is such that she is living in a car currently.  She is a patient of Dr. Olivia Mackie Turner's.  She has three-vessel disease in the LAD intervention last year as well as LV dysfunction on guideline directed optimal medical therapy.  Fortunately she has not taken her meds for the last several months.  She  has CKD 4.  She came in with angina.  Her troponins are flat.  Her EKG shows left bundle branch block unchanged.  She is currently pain-free.  Her exam is benign.  There are no plans to perform cardiac catheterization at this time.  Recommend restarting home meds.  Social service consult.  Patient can probably be discharged in the next 24 to 48 hours.  Follow-up with Dr. Radford Pax as an outpatient.  Lorretta Harp, M.D., [place], [facility], [place] [address] [place], [place]  [address]  [phone] 12/24/2021 4:27 PM

## 2021-12-24 NOTE — ED Notes (Signed)
ED charge RN notified that pt has increase in troponin from 25 to 47.

## 2021-12-25 ENCOUNTER — Observation Stay (HOSPITAL_BASED_OUTPATIENT_CLINIC_OR_DEPARTMENT_OTHER): Payer: Self-pay

## 2021-12-25 DIAGNOSIS — I5040 Unspecified combined systolic (congestive) and diastolic (congestive) heart failure: Secondary | ICD-10-CM

## 2021-12-25 DIAGNOSIS — Z87891 Personal history of nicotine dependence: Secondary | ICD-10-CM

## 2021-12-25 DIAGNOSIS — Z794 Long term (current) use of insulin: Secondary | ICD-10-CM

## 2021-12-25 DIAGNOSIS — I2510 Atherosclerotic heart disease of native coronary artery without angina pectoris: Secondary | ICD-10-CM

## 2021-12-25 DIAGNOSIS — N184 Chronic kidney disease, stage 4 (severe): Secondary | ICD-10-CM

## 2021-12-25 DIAGNOSIS — I6523 Occlusion and stenosis of bilateral carotid arteries: Secondary | ICD-10-CM

## 2021-12-25 DIAGNOSIS — Z8673 Personal history of transient ischemic attack (TIA), and cerebral infarction without residual deficits: Secondary | ICD-10-CM

## 2021-12-25 DIAGNOSIS — I130 Hypertensive heart and chronic kidney disease with heart failure and stage 1 through stage 4 chronic kidney disease, or unspecified chronic kidney disease: Secondary | ICD-10-CM

## 2021-12-25 DIAGNOSIS — R079 Chest pain, unspecified: Secondary | ICD-10-CM

## 2021-12-25 DIAGNOSIS — E1122 Type 2 diabetes mellitus with diabetic chronic kidney disease: Secondary | ICD-10-CM

## 2021-12-25 DIAGNOSIS — R072 Precordial pain: Secondary | ICD-10-CM

## 2021-12-25 DIAGNOSIS — E785 Hyperlipidemia, unspecified: Secondary | ICD-10-CM

## 2021-12-25 LAB — BASIC METABOLIC PANEL
Anion gap: 8 (ref 5–15)
BUN: 32 mg/dL — ABNORMAL HIGH (ref 8–23)
CO2: 21 mmol/L — ABNORMAL LOW (ref 22–32)
Calcium: 8.8 mg/dL — ABNORMAL LOW (ref 8.9–10.3)
Chloride: 111 mmol/L (ref 98–111)
Creatinine, Ser: 1.96 mg/dL — ABNORMAL HIGH (ref 0.44–1.00)
GFR, Estimated: 28 mL/min — ABNORMAL LOW (ref >60)
Glucose, Bld: 131 mg/dL — ABNORMAL HIGH (ref 70–99)
Potassium: 4 mmol/L (ref 3.5–5.1)
Sodium: 140 mmol/L (ref 135–145)

## 2021-12-25 LAB — ECHOCARDIOGRAM COMPLETE
Area-P 1/2: 3.28 cm2
S' Lateral: 3.8 cm

## 2021-12-25 LAB — LIPID PANEL
Cholesterol: 182 mg/dL (ref 0–200)
HDL: 48 mg/dL (ref >40)
LDL Cholesterol: 121 mg/dL — ABNORMAL HIGH (ref 0–99)
Total CHOL/HDL Ratio: 3.8 RATIO
Triglycerides: 67 mg/dL (ref <150)
VLDL: 13 mg/dL (ref 0–40)

## 2021-12-25 LAB — GLUCOSE, CAPILLARY
Glucose-Capillary: 129 mg/dL — ABNORMAL HIGH (ref 70–99)
Glucose-Capillary: 178 mg/dL — ABNORMAL HIGH (ref 70–99)
Glucose-Capillary: 171 mg/dL — ABNORMAL HIGH (ref 70–99)
Glucose-Capillary: 164 mg/dL — ABNORMAL HIGH (ref 70–99)

## 2021-12-25 LAB — MAGNESIUM: Magnesium: 2 mg/dL (ref 1.7–2.4)

## 2021-12-25 NOTE — Evaluation (Signed)
Physical Therapy Evaluation Patient Details Name: [name] MRN: [id number] DOB: 10/24/1955 Today's Date: 12/25/2021  History of Present Illness  66 y.o. female presents to [facility] hospital on 12/23/2021 with chest pain and SOB after arguing with family. PMH includes OA, CHF, CKD, COPD, HTN, PVD, DMII.  Clinical Impression  Pt presents to PT with deficits in balance and gait, however these appear to be more chronic in nature as the pt reports a progressive decline in balance over the last 1-2 years. Pt demonstrates improved stability with BUE support of rollator during this session, as well as increased gait speed. Pt continues to report that she would not like to return home with her daughter, otherwise only reporting access to her son's home in [place]. Pt will benefit from continued acute PT services as well as outpatient PT at the time of discharge to improve balance. PT also recommends the pt receive a rollator at the time of discharge to aide in reducing falls risk.       Recommendations for follow up therapy are one component of a multi-disciplinary discharge planning process, led by the attending physician.  Recommendations may be updated based on patient status, additional functional criteria and insurance authorization.  Follow Up Recommendations Outpatient PT      Assistance Recommended at Discharge Intermittent Supervision/Assistance  Patient can return home with the following  A little help with walking and/or transfers;A little help with bathing/dressing/bathroom;Assistance with cooking/housework;Assist for transportation;Help with stairs or ramp for entrance    Equipment Recommendations Rollator (4 wheels)  Recommendations for Other Services       Functional Status Assessment Patient has had a recent decline in their functional status and demonstrates the ability to make significant improvements in function in a reasonable and predictable amount of time.     Precautions /  Restrictions Precautions Precautions: Fall Restrictions Weight Bearing Restrictions: No      Mobility  Bed Mobility Overal bed mobility: Needs Assistance Bed Mobility: Supine to Sit, Sit to Supine     Supine to sit: Supervision Sit to supine: Supervision   General bed mobility comments: increased time    Transfers Overall transfer level: Needs assistance Equipment used: Rollator (4 wheels), Straight cane Transfers: Sit to/from Stand Sit to Stand: Supervision                Ambulation/Gait Ambulation/Gait assistance: Min guard, Supervision Gait Distance (Feet): 150 Feet (additional trial of 130) Assistive device: Rollator (4 wheels), Straight cane Gait Pattern/deviations: Step-through pattern Gait velocity: reduced Gait velocity interpretation: <1.8 ft/sec, indicate of risk for recurrent falls   General Gait Details: pt with slowed step-through gait with increased lateral sway when utilizing cane. Stability improves some with increased ambulation distance. 2nd trial with BUE support of rollator demonstrates significant improvement in balance and gait speed  Stairs            Wheelchair Mobility    Modified Rankin (Stroke Patients Only)       Balance Overall balance assessment: Needs assistance Sitting-balance support: No upper extremity supported, Feet supported Sitting balance-Leahy Scale: Fair Sitting balance - Comments: one posterior loss of balance when attempting to don socks   Standing balance support: Single extremity supported, Bilateral upper extremity supported, Reliant on assistive device for balance Standing balance-Leahy Scale: Poor                               Pertinent Vitals/Pain Pain Assessment Pain  Assessment: Faces Faces Pain Scale: Hurts little more Pain Location: hips Pain Descriptors / Indicators: Aching Pain Intervention(s): Monitored during session    Home Living Family/patient expects to be discharged  to:: Unsure                   Additional Comments: pt was living with her daughter prior to this admission but does not want to return to her home after argument. Pt has the option to stay with her son in [place], no other options identified at this time    Prior Function Prior Level of Function : Needs assist             Mobility Comments: modI with cane, although pt reports ~6 falls in last 6 months, with progressively worsening balance ADLs Comments: assistance for lower body dressing due to hip OA     Hand Dominance   Dominant Hand: Left    Extremity/Trunk Assessment   Upper Extremity Assessment Upper Extremity Assessment: LUE deficits/detail LUE Deficits / Details: grossly 4/5, weakness from prior CVA    Lower Extremity Assessment Lower Extremity Assessment: LLE deficits/detail LLE Deficits / Details: grossly 4/5, weakness from prior CVA    Cervical / Trunk Assessment Cervical / Trunk Assessment: Kyphotic  Communication   Communication: No difficulties  Cognition Arousal/Alertness: Awake/alert Behavior During Therapy: WFL for tasks assessed/performed Overall Cognitive Status: Within Functional Limits for tasks assessed                                          General Comments General comments (skin integrity, edema, etc.): VSS on RA    Exercises     Assessment/Plan    PT Assessment Patient needs continued PT services  PT Problem List Decreased activity tolerance;Decreased balance;Decreased mobility;Decreased knowledge of use of DME;Pain       PT Treatment Interventions DME instruction;Gait training;Functional mobility training;Therapeutic activities;Therapeutic exercise;Neuromuscular re-education;Balance training;Cognitive remediation;Patient/family education    PT Goals (Current goals can be found in the Care Plan section)  Acute Rehab PT Goals Patient Stated Goal: to reduce falls risk PT Goal Formulation: With patient Time For  Goal Achievement: 01/08/22 Potential to Achieve Goals: Good Additional Goals Additional Goal #1: Pt will demonstrate the recall of locking brakes on rollator prior to transfers without cueing    Frequency Min 3X/week     Co-evaluation               AM-PAC PT "6 Clicks" Mobility  Outcome Measure Help needed turning from your back to your side while in a flat bed without using bedrails?: A Little Help needed moving from lying on your back to sitting on the side of a flat bed without using bedrails?: A Little Help needed moving to and from a bed to a chair (including a wheelchair)?: A Little Help needed standing up from a chair using your arms (e.g., wheelchair or bedside chair)?: A Little Help needed to walk in hospital room?: A Little Help needed climbing 3-5 steps with a railing? : Total 6 Click Score: 16    End of Session   Activity Tolerance: Patient tolerated treatment well Patient left: in bed;with call bell/phone within reach Nurse Communication: Mobility status PT Visit Diagnosis: Muscle weakness (generalized) (M62.81);Unsteadiness on feet (R26.81);History of falling (Z91.81)    Time: 2703-5009 PT Time Calculation (min) (ACUTE ONLY): 40 min   Charges:   PT Evaluation $PT  Eval Low Complexity: 1 Low PT Treatments $Gait Training: 8-22 mins        Zenaida Niece, PT, DPT Acute Rehabilitation Office [place] 12/25/2021, 12:44 PM

## 2021-12-25 NOTE — Progress Notes (Signed)
Echocardiogram 2D Echocardiogram has been performed.  Oneal Deputy Aliyanna Wassmer RDCS 12/25/2021, 11:30 AM

## 2021-12-25 NOTE — Plan of Care (Signed)

## 2021-12-25 NOTE — Progress Notes (Signed)
Rounding Note    Patient Name: [name] Date of Encounter: 12/25/2021  [place] Cardiologist: Sinclair Grooms, MD   Subjective   NAEO. Echo pending. CP free this AM.  Inpatient Medications    Scheduled Meds:  aspirin EC  81 mg Oral Daily   atorvastatin  80 mg Oral Daily   carvedilol  12.5 mg Oral BID WC   enoxaparin (LOVENOX) injection  40 mg Subcutaneous Daily   insulin aspart  0-15 Units Subcutaneous TID WC   insulin aspart  0-5 Units Subcutaneous QHS   insulin glargine-yfgn  20 Units Subcutaneous Daily   isosorbide mononitrate  120 mg Oral Daily   Continuous Infusions:  methocarbamol (ROBAXIN) IV     PRN Meds: acetaminophen, methocarbamol (ROBAXIN) IV, ondansetron (ZOFRAN) IV   Vital Signs    Vitals:   12/25/21 0809 12/25/21 0938 12/25/21 0943 12/25/21 0948  BP: 123/70 134/87 (!) 140/88 (!) 179/86  Pulse: 70 72 75 82  Resp: 20 (!) 23 (!) 23 20  Temp: 98.2 F (36.8 C)     TempSrc: Oral     SpO2: 94% 96% 94% 91%    Intake/Output Summary (Last 24 hours) at 12/25/2021 1026 Last data filed at 12/25/2021 0011 Gross per 24 hour  Intake 240 ml  Output 200 ml  Net 40 ml      11/07/2020    7:00 PM 11/07/2020    4:46 PM 08/27/2020   10:12 AM  Last 3 Weights  Weight (lbs) 160 lb 11.5 oz 162 lb 14.7 oz 169 lb 6.4 oz  Weight (kg) 72.9 kg 73.9 kg 76.839 kg      Telemetry    sinus - Personally Reviewed  ECG    Personally Reviewed  Physical Exam   GEN: No acute distress.   Neck: No JVD Cardiac: RRR, no murmurs, rubs, or gallops.  Respiratory: Clear to auscultation bilaterally. GI: Soft, nontender, non-distended  MS: No edema; No deformity. Neuro:  Nonfocal  Psych: Normal affect   Labs    High Sensitivity Troponin:   Recent Labs  Lab 12/23/21 2215 12/24/21 0044 12/24/21 0956 12/24/21 1224  TROPONINIHS 25* 47* 50* 45*     Chemistry Recent Labs  Lab 12/23/21 2215 12/25/21 0732  NA 140 140  K 3.7 4.0  CL 112* 111  CO2  23 21*  GLUCOSE 236* 131*  BUN 25* 32*  CREATININE 1.88* 1.96*  CALCIUM 9.1 8.8*  GFRNONAA 29* 28*  ANIONGAP 5 8    Lipids  Recent Labs  Lab 12/25/21 0502  CHOL 182  TRIG 67  HDL 48  LDLCALC 121*  CHOLHDL 3.8    Hematology Recent Labs  Lab 12/23/21 2215  WBC 5.4  RBC 4.07  HGB 12.0  HCT 38.0  MCV 93.4  MCH 29.5  MCHC 31.6  RDW 14.8  PLT 233   Thyroid No results for input(s): "TSH", "FREET4" in the last 168 hours.  BNP Recent Labs  Lab 12/23/21 2215  BNP 491.0*    DDimer No results for input(s): "DDIMER" in the last 168 hours.   Radiology    DG Chest 2 View  Result Date: 12/23/2021 CLINICAL DATA:  Dyspnea EXAM: CHEST - 2 VIEW COMPARISON:  08/02/2020 FINDINGS: The lungs are symmetrically well expanded. Stable parenchymal scarring within the left mid lung zone. Discoid atelectasis within the right middle lobe. No superimposed confluent pulmonary infiltrate. No pneumothorax or pleural effusion. Cardiac size is within normal limits. Pulmonary vascularity is normal.  No acute bone abnormality. IMPRESSION: No evidence of acute cardiopulmonary disease. Electronically Signed   By: Fidela Salisbury M.D.   On: 12/23/2021 22:56      Assessment & Plan    65yo here with CP.  #CAD #Known CTO of RCA with L>R collaterals #HLD  - imdur - coreg - lipitor ASA  #Chronic systolic HF Had been off medications for several months at time of admission - Cr prevents ACE/ARB/ARNi - echo pending  #CKDIV  #CAS Asa and statin       For questions or updates, please contact [place] Please consult [email or web address] for contact info under        Signed, Vickie Epley, MD  12/25/2021, 10:26 AM

## 2021-12-25 NOTE — Progress Notes (Cosign Needed Addendum)
Summary: [name] is a 18 year ol per son with a past medical history of CAD s/p PCI to LAD, systolic and diastolic heart failure, LBBB, carotid artery stenosis, PAD, carotid artery stenosis, CKD IV, T2DM who presented for chest tightness in the setting of emotional stress. Thus far cardiac workup has been reassuring.   Subjective: Patient evaluated at bedside this morning. Reports chest tightness with cough this morning but subsequently resolved. Currently feeling well.States her legs are no longer swollen since she has been able to lay down. Reports good urine output with lasix last night.   We discussed how she has been doing since moving to [place], [place]. She reports moving back to [place] in February 2023 due to son having some personal issues.  She was living with her daughter until daughter lost her apartment a few months later, they were then staying with a family friend until about 1.5 months ago.  Since then she has been living out of her car.  She has also been out of her medications except insulin for months due to her financial situation.  Daughter has been looking for housing for the elderly as well as applying for apartments but has yet to find a place to live.  Patient has a son who lives in [place] who would like her to stay with her after discharge from the hospital.  Patient reports she would not want to go back to living in her car at discharge and is working with her family to find an alternative living arrangement.  Objective:  Vital signs in last 24 hours: Vitals:   12/24/21 2030 12/25/21 0010 12/25/21 0440 12/25/21 0809  BP: (!) 94/41 (!) 95/53 132/75 123/70  Pulse:   (!) 56 70  Resp:   18 20  Temp: 98.3 F (36.8 C) 98.3 F (36.8 C) 98.3 F (36.8 C) 98.2 F (36.8 C)  TempSrc:  Oral Oral Oral  SpO2:  97% 98% 94%   Constitutional: Laying in bed in no acute distress Eyes: PERRL, EOMI HENT: Moist mucous membranes  Cardiovascular: Regular rate and rhythm no murmurs or  gallops, no lower extremity edema, no JVD Respiratory: Clear to auscultation bilaterally, no wheezes or crackles.  No increased work of breathing GI: Nondistended, soft, nontender  Neurological: Is alert and oriented x4, no apparent focal deficits noted. Skin: Warm and dry.   Psychiatric: Normal mood and affect.   Assessment/Plan:  Principal Problem:   Chest pain Active Problems:   HTN (hypertension)   Chronic combined systolic and diastolic CHF, NYHA class 2 (HCC)   Diabetes mellitus with stage 4 chronic kidney disease, with long-term current use of insulin (HCC)   Dyslipidemia   DNR (do not resuscitate)   History of CVA (cerebrovascular accident)   Homelessness  Atypical chest pain CAD Hypertension  hyperlipidemia  Patient with chest pain after getting in an argument with daughter. This has now resolved. Has some chest tightness with coughing this morning but otherwise feels well. EKG without acute ischemic changes, troponin downtrended.   History of PCI to LAD. Elevated LDL 121 total cholesterol 182, trig 67. Has not been on statin due to difficult home and financial situation.Suspect this is atypical chest pain with multiple home stressors.  SBP in 90s overnight with some dizziness but negative orthostatics this morning.  Cardiology recommends outpatient follow up for chronic cardiac conditions - Continue asa 81 mg daily, imdur 120 mg daily, coreg 12.5 mg twice daily, and atorvastatin 80 mg daily  - follow  up echo - PT  - Outpatient cardiologist Dr. Radford Pax if staying in [place], will need to find new cardiologist if moving to [facility]     Systolic and diastolic heart failure TTE on 11/08/2020 with EFF 45-50% with grade II diastolic dysfunction. Previously on coreg, entresto, spiro, lasix 20 mg PRN. Received Lasix 20 mg IV yesterday, She reports good urine output, I/Os not fully recorded. Appears euvolemic today.  - Continue coreg 12.5 mg twice daily  - No ACE/ARB/ARNi/MRA due to kidney  disease -follow up echo  - monitor weights and I/Os  Type 2 diabetes mellitus Has been taking lantus 26 units daily at prior to admission. Reports metformin was stopped after she had a rash in [place]. A1c 8.8%. - SSI - Semglee 20 units daily  CKD stage IV BUN 32 and sCR 1.96 appears at baseline. - Monitor BMP  - avoid nephrotoxins   Possible COPD Patient reports she was told she had COPD after she had COVID in 2022. Endorses morning coughs. No prior PFTs is not on inhalers. Appears well on room air without wheezing.  -Outpatient follow if she continues to have significant cough   Bilateral carotid stenosis History of CVA 10/2020 Was on ASA and brillinta, reports her brillianta was stopped in [place]. Needs outpatient follow up for bilateral carotid stenosis.   Social determinates of health Patient currently living in the car. She is uninsured and cannot afford her medications. She plans to apply for medicare when she turns 66 in [place]. Discussing moving to [place] to live with her son.  -TOC consulted   Prior to Admission Living Arrangement: Without house living in car Anticipated Discharge Location: TBD Barriers to Discharge: Medical workup, home situation Dispo: Anticipated discharge in approximately 0-1 day(s).   Iona Beard, MD 12/25/2021, 8:15 AM Pager: [phone] After 5pm on weekdays and 1pm on weekends: On Call pager [phone]

## 2021-12-26 LAB — BASIC METABOLIC PANEL
Anion gap: 8 (ref 5–15)
BUN: 38 mg/dL — ABNORMAL HIGH (ref 8–23)
CO2: 21 mmol/L — ABNORMAL LOW (ref 22–32)
Calcium: 8.7 mg/dL — ABNORMAL LOW (ref 8.9–10.3)
Chloride: 110 mmol/L (ref 98–111)
Creatinine, Ser: 1.91 mg/dL — ABNORMAL HIGH (ref 0.44–1.00)
GFR, Estimated: 29 mL/min — ABNORMAL LOW (ref >60)
Glucose, Bld: 143 mg/dL — ABNORMAL HIGH (ref 70–99)
Potassium: 4.1 mmol/L (ref 3.5–5.1)
Sodium: 139 mmol/L (ref 135–145)

## 2021-12-26 LAB — GLUCOSE, CAPILLARY
Glucose-Capillary: 123 mg/dL — ABNORMAL HIGH (ref 70–99)
Glucose-Capillary: 145 mg/dL — ABNORMAL HIGH (ref 70–99)
Glucose-Capillary: 147 mg/dL — ABNORMAL HIGH (ref 70–99)
Glucose-Capillary: 203 mg/dL — ABNORMAL HIGH (ref 70–99)

## 2021-12-26 NOTE — Progress Notes (Signed)
Physical Therapy Treatment Patient Details Name: [name] MRN: [id number] DOB: 1955/05/22 Today's Date: 12/26/2021   History of Present Illness 66 y.o. female presents to [facility] hospital on 12/23/2021 with chest pain and SOB after arguing with family. PMH includes OA, CHF, CKD, COPD, HTN, PVD, DMII.    PT Comments    Patient progressing well towards PT goals. In a saddened mood upon arrival after talking to her friend who found out she has cancer. Session focused on gait training with rollator. Able to verbalize how to lock/unlock brakes for seated rest break correctly. Pt's balance improved with BUE support. Pt waiting to see where she can go when she discharges from here. Session cut short as pt's lunch arrived. Will continue to follow and progress.   Recommendations for follow up therapy are one component of a multi-disciplinary discharge planning process, led by the attending physician.  Recommendations may be updated based on patient status, additional functional criteria and insurance authorization.  Follow Up Recommendations  Outpatient PT     Assistance Recommended at Discharge Intermittent Supervision/Assistance  Patient can return home with the following A little help with walking and/or transfers;A little help with bathing/dressing/bathroom;Assistance with cooking/housework;Assist for transportation;Help with stairs or ramp for entrance   Equipment Recommendations  Rollator (4 wheels)    Recommendations for Other Services       Precautions / Restrictions Precautions Precautions: Fall Restrictions Weight Bearing Restrictions: No     Mobility  Bed Mobility Overal bed mobility: Needs Assistance Bed Mobility: Supine to Sit, Sit to Supine     Supine to sit: Supervision Sit to supine: Supervision   General bed mobility comments: HOB mostly flat, no use of rails.    Transfers Overall transfer level: Needs assistance Equipment used: None Transfers: Sit to/from  Stand Sit to Stand: Supervision           General transfer comment: Supervision for safety. Stood from [employer].    Ambulation/Gait Ambulation/Gait assistance: Supervision Gait Distance (Feet): 200 Feet Assistive device: Rollator (4 wheels) Gait Pattern/deviations: Step-through pattern, Decreased stride length, Trunk flexed Gait velocity: reduced Gait velocity interpretation: <1.8 ft/sec, indicate of risk for recurrent falls   General Gait Details: Slow, steady gait with rollator, reviewed how to lock/unlock brakes.   Stairs             Wheelchair Mobility    Modified Rankin (Stroke Patients Only)       Balance Overall balance assessment: Needs assistance Sitting-balance support: Feet supported, No upper extremity supported Sitting balance-Leahy Scale: Good     Standing balance support: During functional activity Standing balance-Leahy Scale: Fair Standing balance comment: Able to stand and donn gown behind her back in standing with Min guard, no LOB                            Cognition Arousal/Alertness: Awake/alert Behavior During Therapy: WFL for tasks assessed/performed Overall Cognitive Status: Within Functional Limits for tasks assessed                                 General Comments: Upset as she just got off the phone with a friend who got diagnosed with cancer        Exercises      General Comments General comments (skin integrity, edema, etc.): VSS on RA.      Pertinent Vitals/Pain Pain Assessment Pain Assessment:  Faces Faces Pain Scale: Hurts a little bit Pain Location: a little in chest after crying per report Pain Descriptors / Indicators: Aching Pain Intervention(s): Monitored during session    Home Living                          Prior Function            PT Goals (current goals can now be found in the care plan section) Progress towards PT goals: Progressing toward goals     Frequency    Min 3X/week      PT Plan Current plan remains appropriate    Co-evaluation              AM-PAC PT "6 Clicks" Mobility   Outcome Measure  Help needed turning from your back to your side while in a flat bed without using bedrails?: A Little Help needed moving from lying on your back to sitting on the side of a flat bed without using bedrails?: A Little Help needed moving to and from a bed to a chair (including a wheelchair)?: A Little Help needed standing up from a chair using your arms (e.g., wheelchair or bedside chair)?: A Little Help needed to walk in hospital room?: A Little Help needed climbing 3-5 steps with a railing? : A Lot 6 Click Score: 17    End of Session Equipment Utilized During Treatment: Gait belt Activity Tolerance: Patient tolerated treatment well Patient left: in bed;with call bell/phone within reach (sitting EOB) Nurse Communication: Mobility status PT Visit Diagnosis: Muscle weakness (generalized) (M62.81);Unsteadiness on feet (R26.81);History of falling (Z91.81)     Time: 4492-0100 PT Time Calculation (min) (ACUTE ONLY): 15 min  Charges:  $Gait Training: 8-22 mins                     Marisa Severin, PT, DPT Acute Rehabilitation Services Secure chat preferred Office [place] 12/26/2021, 3:16 PM

## 2021-12-26 NOTE — Progress Notes (Signed)
HD#0 Subjective:   Summary: [name] is a 66 year old female with PMH of CKD 4, COPD, HTN, HLD, T2DM presents with chest pain and shortness of breath and admitted for concern of ACS.   Overnight Events: none  [name] does not have chest pain or shortness of breath today.  She believes it started after an argument with family.  She plans to talk to her son about her living arrangements. Currently living in car so plan might be to move back to [place] with son.   Objective:  Vital signs in last 24 hours: Vitals:   12/26/21 0511 12/26/21 0840 12/26/21 1713 12/26/21 1830  BP: (!) 166/87 (!) 151/91 (!) 142/66 (!) 121/58  Pulse: 81 78 68 70  Resp: 20 15 16 16   Temp: 98.1 F (36.7 C) 98.5 F (36.9 C)  98.6 F (37 C)  TempSrc: Oral Oral  Oral  SpO2: 98% 100%  100%   Supplemental O2: Room Air SpO2: 100 %   Physical Exam:  Constitutional: alert, in no acute distress HENT: normocephalic atraumatic Neck: supple Cardiovascular: regular rate and rhythm, no LE edema, 2+ dorsalis pedis pulse bilaterally Pulmonary/Chest: normal work of breathing on room air, lungs clear to auscultation bilaterally MSK: normal bulk and tone Neurological: alert & oriented x 3 Skin: warm and dry Psych: tearful affect about living arrangements   There were no vitals filed for this visit.   Intake/Output Summary (Last 24 hours) at 12/26/2021 2006 Last data filed at 12/26/2021 1337 Gross per 24 hour  Intake --  Output 1050 ml  Net -1050 ml   Net IO Since Admission: -1,260 mL [12/26/21 2006]  Pertinent Labs:    Latest Ref Rng & Units 12/23/2021   10:15 PM 11/11/2020   12:28 AM 11/10/2020    3:36 AM  CBC  WBC 4.0 - 10.5 K/uL 5.4  6.1  6.1   Hemoglobin 12.0 - 15.0 g/dL 12.0  10.6  10.6   Hematocrit 36.0 - 46.0 % 38.0  33.0  32.5   Platelets 150 - 400 K/uL 233  229  216        Latest Ref Rng & Units 12/26/2021    7:24 AM 12/25/2021    7:32 AM 12/23/2021   10:15 PM  CMP  Glucose 70 - 99 mg/dL  143  131  236   BUN 8 - 23 mg/dL 38  32  25   Creatinine 0.44 - 1.00 mg/dL 1.91  1.96  1.88   Sodium 135 - 145 mmol/L 139  140  140   Potassium 3.5 - 5.1 mmol/L 4.1  4.0  3.7   Chloride 98 - 111 mmol/L 110  111  112   CO2 22 - 32 mmol/L 21  21  23    Calcium 8.9 - 10.3 mg/dL 8.7  8.8  9.1     Imaging: No results found.  Assessment/Plan:   Principal Problem:   Chest pain Active Problems:   HTN (hypertension)   Chronic combined systolic and diastolic CHF, NYHA class 2 (HCC)   Diabetes mellitus with stage 4 chronic kidney disease, with long-term current use of insulin (HCC)   Dyslipidemia   DNR (do not resuscitate)   History of CVA (cerebrovascular accident)   Homelessness   Patient Summary: [name] is a 66 y.o. with PMH of CKD 4, COPD, HTN, HLD, T2DM presents with chest pain and shortness of breath and admitted for chest pain evaluation.    Chest pain CAD HTN  HLD EKG did not show acute infarction. Troponin trending down. LDL 121. On imdur, carvedilol, lipitor and ASA but has not been able to refill medications due to living situation and financial circumstances. Cardiology evaluated and will plan for outpatient follow up. She has a cardiologist in [place] which she can see if she goes back there to live with son.  -discuss with TOC about medication assistance -continue imdur 120 mg daily, carvedilol 12.5 mg BID, atorvastatin 80 mg daily, ASA 81 mg daily -cardiology follow up outpatient  HFrEF Echocardiogram showed EF 35 to 40% with global hypokinesis, LVH with apical sparing.  -continue carvedilol   T2DM Fasting glucose 143 this morning. CBGs have been <180. At home she takes lantus 26 units daily. A1c 8.8%.  -SSI -semglee 20 units daily  CKD stage 4 Creatinine 1.91 which is around baseline.  Continue to monitor.  TOC Patient currently living in car. She is unable to afford her medications which is why she has been off for several weeks.  She plans to talk  to her sons about moving to either [place] or [place]. She has established care in [place].   Diet: Heart Healthy IVF: None,None VTE: Enoxaparin Code: DNR PT/OT recs:  outpatient PT , Rollator 4 wheels. TOC recs: pending  Family Update: discussed with her son, [name], who lives in [place] about updates for her living arrangements, he plans to talk to his brother in [place] about her moving back there  Dispo: Anticipated discharge to Home in 1-2 days pending safe discharge to family housing.   Angelique Blonder, DO Internal Medicine Resident PGY-1 Please contact the on call pager after 5 pm and on weekends at [phone].

## 2021-12-26 NOTE — Progress Notes (Signed)
Rounding Note    Patient Name: [name] Date of Encounter: 12/26/2021  [place] Cardiologist: Sinclair Grooms, MD   Subjective   NAEO. Awaiting dispo plan given she was living in a car prior to arrival.  Inpatient Medications    Scheduled Meds:  aspirin EC  81 mg Oral Daily   atorvastatin  80 mg Oral Daily   carvedilol  12.5 mg Oral BID WC   enoxaparin (LOVENOX) injection  40 mg Subcutaneous Daily   insulin aspart  0-15 Units Subcutaneous TID WC   insulin aspart  0-5 Units Subcutaneous QHS   insulin glargine-yfgn  20 Units Subcutaneous Daily   isosorbide mononitrate  120 mg Oral Daily   Continuous Infusions:  methocarbamol (ROBAXIN) IV     PRN Meds: acetaminophen, methocarbamol (ROBAXIN) IV, ondansetron (ZOFRAN) IV   Vital Signs    Vitals:   12/25/21 1621 12/25/21 1951 12/26/21 0511 12/26/21 0840  BP: (!) 124/56 (!) 124/56 (!) 166/87 (!) 151/91  Pulse: 70 70 81 78  Resp: (!) 27 20 20 15   Temp: 98.8 F (37.1 C) 98.5 F (36.9 C) 98.1 F (36.7 C) 98.5 F (36.9 C)  TempSrc: Oral  Oral Oral  SpO2: 94%  98% 100%    Intake/Output Summary (Last 24 hours) at 12/26/2021 1002 Last data filed at 12/26/2021 0515 Gross per 24 hour  Intake --  Output 550 ml  Net -550 ml       11/07/2020    7:00 PM 11/07/2020    4:46 PM 08/27/2020   10:12 AM  Last 3 Weights  Weight (lbs) 160 lb 11.5 oz 162 lb 14.7 oz 169 lb 6.4 oz  Weight (kg) 72.9 kg 73.9 kg 76.839 kg      Telemetry    sinus - Personally Reviewed  ECG    Personally Reviewed  Physical Exam   GEN: No acute distress.   Neck: No JVD Cardiac: RRR, no murmurs, rubs, or gallops.  Respiratory: Clear to auscultation bilaterally. GI: Soft, nontender, non-distended  MS: No edema; No deformity. Neuro:  Nonfocal  Psych: Normal affect   Labs    High Sensitivity Troponin:   Recent Labs  Lab 12/23/21 2215 12/24/21 0044 12/24/21 0956 12/24/21 1224  TROPONINIHS 25* 47* 50* 45*       Chemistry Recent Labs  Lab 12/23/21 2215 12/25/21 0732 12/26/21 0724  NA 140 140 139  K 3.7 4.0 4.1  CL 112* 111 110  CO2 23 21* 21*  GLUCOSE 236* 131* 143*  BUN 25* 32* 38*  CREATININE 1.88* 1.96* 1.91*  CALCIUM 9.1 8.8* 8.7*  MG  --  2.0  --   GFRNONAA 29* 28* 29*  ANIONGAP 5 8 8      Lipids  Recent Labs  Lab 12/25/21 0502  CHOL 182  TRIG 67  HDL 48  LDLCALC 121*  CHOLHDL 3.8     Hematology Recent Labs  Lab 12/23/21 2215  WBC 5.4  RBC 4.07  HGB 12.0  HCT 38.0  MCV 93.4  MCH 29.5  MCHC 31.6  RDW 14.8  PLT 233    Thyroid No results for input(s): "TSH", "FREET4" in the last 168 hours.  BNP Recent Labs  Lab 12/23/21 2215  BNP 491.0*     DDimer No results for input(s): "DDIMER" in the last 168 hours.   Radiology    ECHOCARDIOGRAM COMPLETE  Result Date: 12/25/2021    ECHOCARDIOGRAM REPORT   Patient Name:   [name]  Date of Exam: 12/25/2021 Medical Rec #:  [id number]      Height:       63.0 in Accession #:    [id number]     Weight:       160.7 lb Date of Birth:  1955/10/17     BSA:          1.762 m Patient Age:    66 years       BP:           179/86 mmHg Patient Gender: F              HR:           76 bpm. Exam Location:  Inpatient Procedure: 2D Echo, 3D Echo, Color Doppler, Cardiac Doppler and Strain Analysis Indications:    R07.9* Chest pain, unspecified  History:        Patient has prior history of Echocardiogram examinations, most                 recent 11/08/2020. CHF, CAD, COPD; Risk Factors:Hypertension,                 Diabetes and Dyslipidemia.  Sonographer:    Raquel Sarna Senior RDCS Referring Phys: [place] IMPRESSIONS  1. GLS globally significantly down , although mildly better in the apex. Left ventricular ejection fraction, by estimation, is 35 to 40%. The left ventricle has moderately decreased function. The left ventricle demonstrates global hypokinesis. There is moderate concentric left ventricular hypertrophy.  2. Right ventricular  systolic function is normal. The right ventricular size is normal.  3. Left atrial size was mild to moderately dilated.  4. The aortic valve is calcified. Aortic valve regurgitation is trivial. Aortic valve sclerosis/calcification is present, without any evidence of aortic stenosis. Comparison(s): No significant change from prior study. Conclusion(s)/Recommendation(s): With significant LVH, and possible apical sparing recommend w/u amyloid including PYP study, SPEP/UPEP/FLC. FINDINGS  Left Ventricle: GLS globally significantly down , although mildly better in the apex. Left ventricular ejection fraction, by estimation, is 35 to 40%. The left ventricle has moderately decreased function. The left ventricle demonstrates global hypokinesis. The left ventricular internal cavity size was normal in size. There is moderate concentric left ventricular hypertrophy. Right Ventricle: The right ventricular size is normal. Right ventricular systolic function is normal. Left Atrium: Left atrial size was mild to moderately dilated. Right Atrium: Right atrial size was normal in size. Pericardium: There is no evidence of pericardial effusion. Mitral Valve: Mild to moderate mitral annular calcification. Tricuspid Valve: Tricuspid valve regurgitation is not demonstrated. Aortic Valve: The aortic valve is calcified. Aortic valve regurgitation is trivial. Aortic valve sclerosis/calcification is present, without any evidence of aortic stenosis. Pulmonic Valve: Pulmonic valve regurgitation is not visualized. Aorta: The aortic root and ascending aorta are structurally normal, with no evidence of dilitation.  LEFT VENTRICLE PLAX 2D LVIDd:         4.50 cm   Diastology LVIDs:         3.80 cm   LV e' medial:    3.05 cm/s LV PW:         1.40 cm   LV E/e' medial:  32.3 LV IVS:        1.40 cm   LV e' lateral:   4.13 cm/s LVOT diam:     1.90 cm   LV E/e' lateral: 23.8 LV SV:         53 LV SV Index:   30 LVOT Area:  2.84 cm                            3D Volume EF:                          3D EF:        24 %                          LV EDV:       146 ml                          LV ESV:       110 ml                          LV SV:        36 ml RIGHT VENTRICLE RV S prime:     10.70 cm/s TAPSE (M-mode): 1.8 cm LEFT ATRIUM             Index        RIGHT ATRIUM           Index LA diam:        4.50 cm 2.55 cm/m   RA Area:     11.30 cm LA Vol (A2C):   85.9 ml 48.75 ml/m  RA Volume:   22.60 ml  12.83 ml/m LA Vol (A4C):   61.2 ml 34.73 ml/m LA Biplane Vol: 76.7 ml 43.53 ml/m  AORTIC VALVE LVOT Vmax:   98.40 cm/s LVOT Vmean:  75.000 cm/s LVOT VTI:    0.187 m  AORTA Ao Root diam: 2.50 cm Ao Asc diam:  2.90 cm MITRAL VALVE MV Area (PHT): 3.28 cm    SHUNTS MV Decel Time: 231 msec    Systemic VTI:  0.19 m MV E velocity: 98.50 cm/s  Systemic Diam: 1.90 cm MV A velocity: 97.00 cm/s MV E/A ratio:  1.02 Mary Scientist, physiological signed by Phineas Inches Signature Date/Time: 12/25/2021/1:42:35 PM    Final       Assessment & Plan    65yo here with CP.  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 - Cr prevents ACE/ARB/ARNi  #CKDIV  #CAS Asa and statin  Cardiology will sign off for now. Outpatient follow up will be arranged.     For questions or updates, please contact [place] Please consult [email or web address] for contact info under        Signed, Vickie Epley, MD  12/26/2021, 10:02 AM

## 2021-12-27 ENCOUNTER — Telehealth: Payer: Self-pay | Admitting: Interventional Cardiology

## 2021-12-27 DIAGNOSIS — R079 Chest pain, unspecified: Secondary | ICD-10-CM

## 2021-12-27 DIAGNOSIS — Z5902 Unsheltered homelessness: Secondary | ICD-10-CM

## 2021-12-27 LAB — BASIC METABOLIC PANEL
Anion gap: 9 (ref 5–15)
BUN: 37 mg/dL — ABNORMAL HIGH (ref 8–23)
CO2: 23 mmol/L (ref 22–32)
Calcium: 8.9 mg/dL (ref 8.9–10.3)
Chloride: 110 mmol/L (ref 98–111)
Creatinine, Ser: 1.88 mg/dL — ABNORMAL HIGH (ref 0.44–1.00)
GFR, Estimated: 29 mL/min — ABNORMAL LOW (ref >60)
Glucose, Bld: 98 mg/dL (ref 70–99)
Potassium: 4.5 mmol/L (ref 3.5–5.1)
Sodium: 142 mmol/L (ref 135–145)

## 2021-12-27 LAB — GLUCOSE, CAPILLARY
Glucose-Capillary: 95 mg/dL (ref 70–99)
Glucose-Capillary: 197 mg/dL — ABNORMAL HIGH (ref 70–99)
Glucose-Capillary: 126 mg/dL — ABNORMAL HIGH (ref 70–99)
Glucose-Capillary: 126 mg/dL — ABNORMAL HIGH (ref 70–99)

## 2021-12-27 NOTE — Progress Notes (Signed)
CSW spoke with patient to discuss her discharge plan. Patient reports she was previously living in a car with her daughter and desires not to return to that situation. Patient reports she has spoken with her brothers who are making efforts to create a safe discharge plan for the patient. Patient stated she did not want CSW to contact her brothers that the plan was for them to speak with her today with an answer. Patient states she has spoken with staff at [employer] who states the agency cannot assist with housing at this time. CSW informed patient that she is medically stable for discharge and that shelter resources will be given to her.  CSW added shelter resources to [place], MSW, LCSW Transitions of Care  Clinical Social Worker II [phone]

## 2021-12-27 NOTE — Progress Notes (Addendum)
HD#0 Subjective:   Summary: [name] is a 66 year old female with PMH of CKD 4, COPD, HTN, HLD, T2DM presents with chest pain and shortness of breath and admitted for concern of ACS.   Overnight Events:  none  Subjective: Feeling well this morning. States she is going to talk with family to figure out dispo. No complaints.   Objective:  Vital signs in last 24 hours: Blood pressure (!) 98/49, pulse 67, temperature 97.8 F (36.6 C), temperature source Oral, resp. rate 17, SpO2 94 %.  Vitals:   12/26/21 1830 12/26/21 2016 12/27/21 0545 12/27/21 1349  BP: (!) 121/58 137/64 (!) 147/77 (!) 98/49  Pulse: 70 66 67   Resp: 16 18 18 17   Temp: 98.6 F (37 C) 98.8 F (37.1 C) 97.9 F (36.6 C) 97.8 F (36.6 C)  TempSrc: Oral Oral Oral Oral  SpO2: 100% 100% 94%    Supplemental O2: Room Air SpO2: 94 %   Physical Exam:  Constitutional: alert, in no acute distress  HENT: normocephalic atraumatic Neck: supple Cardiovascular: regular rate and rhythm, no LE edema,  Pulmonary/Chest: normal work of breathing on room air, lungs clear to auscultation bilaterally MSK: normal bulk and tone Neurological: alert & oriented x 3 Skin: warm and dry Psych: mood and affect appropriate  There were no vitals filed for this visit.   Intake/Output Summary (Last 24 hours) at 12/27/2021 1753 Last data filed at 12/27/2021 9417 Gross per 24 hour  Intake 240 ml  Output 1250 ml  Net -1010 ml   Net IO Since Admission: -2,270 mL [12/27/21 1753]  Pertinent Labs:    Latest Ref Rng & Units 12/23/2021   10:15 PM 11/11/2020   12:28 AM 11/10/2020    3:36 AM  CBC  WBC 4.0 - 10.5 K/uL 5.4  6.1  6.1   Hemoglobin 12.0 - 15.0 g/dL 12.0  10.6  10.6   Hematocrit 36.0 - 46.0 % 38.0  33.0  32.5   Platelets 150 - 400 K/uL 233  229  216        Latest Ref Rng & Units 12/27/2021    8:06 AM 12/26/2021    7:24 AM 12/25/2021    7:32 AM  CMP  Glucose 70 - 99 mg/dL 98  143  131   BUN 8 - 23 mg/dL 37  38  32    Creatinine 0.44 - 1.00 mg/dL 1.88  1.91  1.96   Sodium 135 - 145 mmol/L 142  139  140   Potassium 3.5 - 5.1 mmol/L 4.5  4.1  4.0   Chloride 98 - 111 mmol/L 110  110  111   CO2 22 - 32 mmol/L 23  21  21    Calcium 8.9 - 10.3 mg/dL 8.9  8.7  8.8     Imaging: No results found.  Assessment/Plan:   Principal Problem:   Chest pain Active Problems:   HTN (hypertension)   Chronic combined systolic and diastolic CHF, NYHA class 2 (HCC)   Diabetes mellitus with stage 4 chronic kidney disease, with long-term current use of insulin (HCC)   Dyslipidemia   DNR (do not resuscitate)   History of CVA (cerebrovascular accident)   Homelessness   Patient Summary: [name] is a 66 y.o. with PMH of CKD 4, COPD, HTN, HLD, T2DM presents with chest pain and shortness of breath and admitted for chest pain evaluation.    Chest pain CAD HTN HLD EKG did not show acute infarction. Troponin trending  down. LDL 121. On imdur, carvedilol, lipitor and ASA but has not been able to refill medications due to living situation and financial circumstances. Cardiology evaluated and will plan for outpatient follow up. She has a cardiologist in [place] which she can see if she goes back there to live with son.  -discuss with TOC about medication assistance -continue imdur 120 mg daily, carvedilol 12.5 mg BID, atorvastatin 80 mg daily, ASA 81 mg daily -cardiology follow up outpatient - medically stable pending safe dispo plan. May require dispo to shelter if pt unable to contact sons.  HFrEF Echocardiogram showed EF 35 to 40% with global hypokinesis, LVH with apical sparing.  -continue carvedilol   T2DM At home she takes lantus 26 units daily. A1c 8.8%.  CBG in 120-200s range. -SSI -semglee 20 units daily  CKD stage 4 Creatinine near baseline 1.9.  Continue to monitor.  TOC Patient currently living in car. She is unable to afford her medications which is why she has been off for several weeks.  She  plans to talk to her sons about moving to either [place] or [place]. She has established care in [place].  - patient is stable for discharge pending safe dispo. She is currently homeless. Is supposed to be coordinating with sons regarding living arrangement. Declined SW assistance today. If no progress made tomorrow, anticipate discharge with resources for shelters.She will be provided with 1 month supply of medicines via TOC and instructions to follow up with PCP for further assistance.   Diet: Heart Healthy IVF: None,None VTE: Enoxaparin Code: DNR PT/OT recs:  outpatient PT , Rollator 4 wheels. TOC recs: pending  Family Update: discussed with her son, Rod, who lives in [place] about updates for her living arrangements, he plans to talk to his brother in [place] about her moving back there  Dispo: Anticipated discharge to Home in 1-2 days pending safe discharge to family housing.   Delene Ruffini, MD

## 2021-12-27 NOTE — Telephone Encounter (Signed)
TOC scheduled for 09/05 at 2:20pm with Ambrose Pancoast, NP per Bernerd Pho, [place].

## 2021-12-27 NOTE — Hospital Course (Signed)
8/28: Patient states that she is feeling well overall today. She states that her SOB has improved since yesterday. She states that she is still coughing up yellowish sputum. Patient states that she is eating and drinking well.  ---------------- 8/29: Patient states that she spoke with her sons yesterday about her living situation but that they are still discussing this with her wives. She states that she is contacting a member of her church for resources on housing. Discussed that the patient appears to be medically cleared. Discussed plan to hopefully discharge today. Discussed plan to have provide the patient with social works options for her housing. Patient agrees with the plan.

## 2021-12-27 NOTE — Progress Notes (Signed)
Mobility Specialist - Progress Note   12/27/21 1231  Mobility  Activity Ambulated with assistance in hallway  Level of Assistance Contact guard assist, steadying assist  Assistive Device Four wheel walker  Distance Ambulated (ft) 350 ft  Activity Response Tolerated well  $Mobility charge 1 Mobility   Received pt in bed having no complaints and agreeable to mobility. Pt was asymptomatic throughout ambulation and returned to room w/o fault. Left in bed w/ call bell in reach and all needs met.   Larey Seat

## 2021-12-28 ENCOUNTER — Other Ambulatory Visit (HOSPITAL_COMMUNITY): Payer: Self-pay

## 2021-12-28 LAB — GLUCOSE, CAPILLARY: Glucose-Capillary: 85 mg/dL (ref 70–99)

## 2021-12-28 MED ORDER — BASAGLAR KWIKPEN 100 UNIT/ML ~~LOC~~ SOPN
26.0000 [IU] | PEN_INJECTOR | Freq: Every day | SUBCUTANEOUS | 0 refills | Status: AC
  Filled 2021-12-28: qty 6, 23d supply, fill #0

## 2021-12-28 MED ORDER — ISOSORBIDE MONONITRATE ER 120 MG PO TB24
120.0000 mg | ORAL_TABLET | Freq: Every day | ORAL | 3 refills | Status: AC
  Filled 2021-12-28: qty 20, 20d supply, fill #0

## 2021-12-28 MED ORDER — OMEPRAZOLE 20 MG PO CPDR
20.0000 mg | DELAYED_RELEASE_CAPSULE | Freq: Every day | ORAL | 0 refills | Status: AC | PRN
  Filled 2021-12-28: qty 30, 30d supply, fill #0

## 2021-12-28 MED ORDER — ATORVASTATIN CALCIUM 80 MG PO TABS
80.0000 mg | ORAL_TABLET | Freq: Every day | ORAL | 0 refills | Status: AC
  Filled 2021-12-28: qty 30, 30d supply, fill #0

## 2021-12-28 MED ORDER — NITROGLYCERIN 0.4 MG SL SUBL
0.4000 mg | SUBLINGUAL_TABLET | SUBLINGUAL | 0 refills | Status: AC | PRN
  Filled 2021-12-28: qty 25, 7d supply, fill #0

## 2021-12-28 MED ORDER — FINGERSTIX LANCETS MISC
0 refills | Status: AC
  Filled 2021-12-28: qty 100, 25d supply, fill #0

## 2021-12-28 MED ORDER — FUROSEMIDE 20 MG PO TABS
20.0000 mg | ORAL_TABLET | ORAL | 3 refills | Status: AC | PRN
  Filled 2021-12-28: qty 30, 30d supply, fill #0

## 2021-12-28 MED ORDER — INSULIN GLARGINE SOLOSTAR 100 UNIT/ML ~~LOC~~ SOPN
20.0000 [IU] | PEN_INJECTOR | Freq: Every day | SUBCUTANEOUS | 0 refills | Status: DC
  Filled 2021-12-28: qty 6, 30d supply, fill #0

## 2021-12-28 MED ORDER — PEN NEEDLES 32G X 4 MM MISC
1.0000 [IU] | Freq: Every day | 0 refills | Status: AC
  Filled 2021-12-28: qty 100, 25d supply, fill #0

## 2021-12-28 MED ORDER — CARVEDILOL 12.5 MG PO TABS
12.5000 mg | ORAL_TABLET | Freq: Two times a day (BID) | ORAL | 0 refills | Status: AC
  Filled 2021-12-28: qty 60, 30d supply, fill #0

## 2021-12-28 MED ORDER — ASPIRIN 81 MG PO TBEC
81.0000 mg | DELAYED_RELEASE_TABLET | Freq: Every day | ORAL | 11 refills | Status: AC
  Filled 2021-12-28: qty 30, 30d supply, fill #0

## 2021-12-28 MED ORDER — METFORMIN HCL 500 MG PO TABS
1000.0000 mg | ORAL_TABLET | Freq: Two times a day (BID) | ORAL | 0 refills | Status: AC
  Filled 2021-12-28: qty 120, 30d supply, fill #0

## 2021-12-28 MED ORDER — BLOOD GLUCOSE MONITOR KIT
PACK | 0 refills | Status: AC
  Filled 2021-12-28: qty 1, 30d supply, fill #0

## 2021-12-28 MED ORDER — GLUCOSE BLOOD VI STRP
ORAL_STRIP | 0 refills | Status: AC
  Filled 2021-12-28: qty 100, 25d supply, fill #0

## 2021-12-28 NOTE — Progress Notes (Addendum)
Discharge instructions reviewed with pt.  Copy of instructions given to pt. Scripts filled by [facility] and given to pt. (Meds delivered to pt's room at 1341)  Pt also has rollator walker that was delivered to her room. Pt d/c'd with her cane also.  Pt d/c'd via wheelchair with belongings.  Taxi voucher provided by SW, and taxi being called to take pt to [facility] center, to arrive before 1500.            Escorted by hospital staff to discharge lounge while waiting for taxi to arrive.

## 2021-12-28 NOTE — TOC Transition Note (Signed)
Transition of Care [facility]) - CM/SW Discharge Note   Patient Details  Name: [name] MRN: [id number] Date of Birth: Jul 09, 1955  Transition of [facility]) CM/SW Contact:  Milas Gain, [place] Phone Number: 12/28/2021, 11:52 AM   Clinical Narrative:     Patient will DC to: [facility]  Anticipated DC date: 12/28/2021  Family notified: Patient declined  Transport by: [employer] [employer]   ?  Per MD patient ready for DC to [facility] . RN and  patient notified of DC.  [employer] transport requested for patient.  CSW signing off.         Patient Goals and CMS Choice        Discharge Placement                       Discharge Plan and Services                                     Social Determinants of Health (SDOH) Interventions     Readmission Risk Interventions    08/03/2020    3:05 PM 06/12/2020    2:37 PM  Readmission Risk Prevention Plan  Transportation Screening Complete Complete  PCP or Specialist Appt within 5-7 Days  Complete  PCP or Specialist Appt within 3-5 Days Complete   Home Care Screening  Complete  Medication Review (RN CM)  Complete  HRI or Home Care Consult Complete   Social Work Consult for [place] Planning/Counseling Complete   Palliative Care Screening Not Applicable   Medication Review Press photographer) Complete

## 2021-12-28 NOTE — Discharge Instructions (Addendum)
Dear Mrs Henshaw,  Thank you for trusting [place] with your care. We treated you in the hospital for shortness of breath. We would like for you to STOP taking the Entresto and STOP the spironolactone. Please continue taking your other medications as you have. We have scheduled a hospital follow up appointment with the [facility] clinic on Sept. 7th at 8:45AM with Dr. Chancy Milroy. Please also follow up with the heart doctors. Please ensure that you are able to be at this appointment.

## 2021-12-28 NOTE — Progress Notes (Signed)
Physical Therapy Treatment Patient Details Name: [name] MRN: [id number] DOB: 1956/03/08 Today's Date: 12/28/2021   History of Present Illness 66 y.o. female presents to [facility] hospital on 12/23/2021 with chest pain and SOB after arguing with family. PMH includes OA, CHF, CKD, COPD, HTN, PVD, DMII.    PT Comments    Patient able to demonstrate safe technique with in room ambulation and using rollator for hallway ambulation.  Negotiated stairs with 1 rail and minguard A with sideways technique.  She reports possible plans for home with daughter in [place] as sons have not reached back out, but wants to get info from Education officer, museum.  Will benefit from rollator at d/c.  PT will follow until d/c.    Recommendations for follow up therapy are one component of a multi-disciplinary discharge planning process, led by the attending physician.  Recommendations may be updated based on patient status, additional functional criteria and insurance authorization.  Follow Up Recommendations  Outpatient PT     Assistance Recommended at Discharge Intermittent Supervision/Assistance  Patient can return home with the following A little help with walking and/or transfers;A little help with bathing/dressing/bathroom;Assistance with cooking/housework;Assist for transportation;Help with stairs or ramp for entrance   Equipment Recommendations  Rollator (4 wheels)    Recommendations for Other Services       Precautions / Restrictions Precautions Precautions: Fall     Mobility  Bed Mobility Overal bed mobility: Modified Independent                  Transfers Overall transfer level: Modified independent                 General transfer comment: increased time and effort, performed x 4 (fromn EOB and toilet)    Ambulation/Gait Ambulation/Gait assistance: Supervision Gait Distance (Feet): 200 Feet Assistive device: Rollator (4 wheels) Gait Pattern/deviations: Step-through pattern,  Trendelenburg, Decreased stride length, Antalgic       General Gait Details: L hip weakness, R hip pain per pt, leans L in L stance and some antalgia on R   Stairs Stairs: Yes Stairs assistance: Min guard Stair Management: One rail Left, Step to pattern, Sideways Number of Stairs: 6 General stair comments: education/demonstration on technique prior to pt performing   Wheelchair Mobility    Modified Rankin (Stroke Patients Only)       Balance Overall balance assessment: Needs assistance   Sitting balance-Leahy Scale: Good Sitting balance - Comments: donning socks and mesh panties at EOB     Standing balance-Leahy Scale: Fair Standing balance comment: standing no UE support to wash hands after toileting                            Cognition Arousal/Alertness: Awake/alert Behavior During Therapy: WFL for tasks assessed/performed                                            Exercises      General Comments General comments (skin integrity, edema, etc.): mild dyspnea with ambulation      Pertinent Vitals/Pain Pain Assessment Pain Score: 8  Pain Location: R hip with ambulation Pain Descriptors / Indicators: Aching Pain Intervention(s): Monitored during session, Patient requesting pain meds-RN notified    Home Living  Prior Function            PT Goals (current goals can now be found in the care plan section) Progress towards PT goals: Progressing toward goals    Frequency    Min 3X/week      PT Plan Current plan remains appropriate    Co-evaluation              AM-PAC PT "6 Clicks" Mobility   Outcome Measure  Help needed turning from your back to your side while in a flat bed without using bedrails?: None Help needed moving from lying on your back to sitting on the side of a flat bed without using bedrails?: None Help needed moving to and from a bed to a chair (including a  wheelchair)?: None Help needed standing up from a chair using your arms (e.g., wheelchair or bedside chair)?: None Help needed to walk in hospital room?: None Help needed climbing 3-5 steps with a railing? : A Little 6 Click Score: 23    End of Session   Activity Tolerance: Patient tolerated treatment well Patient left: in bed;with call bell/phone within reach (sitting EOB)   PT Visit Diagnosis: Muscle weakness (generalized) (M62.81);Unsteadiness on feet (R26.81);History of falling (Z91.81)     Time: 4765-4650 PT Time Calculation (min) (ACUTE ONLY): 23 min  Charges:  $Gait Training: 8-22 mins $Therapeutic Activity: 8-22 mins                     [name], PT Acute Rehabilitation Services Office:[phone] 12/28/2021    [name] 12/28/2021, 10:08 AM

## 2021-12-28 NOTE — Care Management (Addendum)
12-28-21 1141 Case Manager ordered a rollator for the patient via [place]. DME will be delivered to the room prior to transition from the hospital. Case Manager unable to [facility] this patient. Patient has utilized [facility] during the year. [place] is trying to aide the patient with medications. No further needs identified at this time.   1211 12-28-21 Case Manager submitted outpatient Physical Therapy referral to [facility]. Office to call the patient with visit times.  No further needs from Case Manager at this time.

## 2021-12-28 NOTE — Discharge Summary (Signed)
Name: [name] MRN: [id number] DOB: Feb 13, 1956 66 y.o. PCP: Pcp, No  Date of Admission: 12/23/2021  9:24 PM Date of Discharge: 12/28/21 Attending Physician: Dr. Daryll Drown  Discharge Diagnosis: Principal Problem:   Chest pain Active Problems:   HTN (hypertension)   Chronic combined systolic and diastolic CHF, NYHA class 2 (HCC)   Diabetes mellitus with stage 4 chronic kidney disease, with long-term current use of insulin ([place])   Dyslipidemia   DNR (do not resuscitate)   History of CVA (cerebrovascular accident)   Homelessness    Discharge Medications: Allergies as of 12/28/2021       Reactions   Lisinopril Cough   Plavix [clopidogrel Bisulfate] Rash        Medication List     STOP taking these medications    Entresto 49-51 MG Generic drug: sacubitril-valsartan   spironolactone 25 MG tablet Commonly known as: ALDACTONE       TAKE these medications    Accu-Chek FastClix Lancets Misc use as directed to check blood sugar up to 4 times daily   Accu-Chek Guide test strip Generic drug: glucose blood Use to check sugar as directed up to 4 times daily.   Accu-Chek Guide w/Device Kit Use up to four times daily as directed.   acetaminophen 500 MG tablet Commonly known as: TYLENOL Take 1,000 mg by mouth every 6 (six) hours as needed for mild pain, fever or headache.   Aspirin Low Dose 81 MG tablet Generic drug: aspirin EC Take 1 tablet (81 mg total) by mouth daily. Swallow whole.   atorvastatin 80 MG tablet Commonly known as: Lipitor Take 1 tablet by mouth daily.   Basaglar KwikPen 100 UNIT/ML Inject 26 Units into the skin at bedtime. What changed: Another medication with the same name was removed. Continue taking this medication, and follow the directions you see here.   carvedilol 12.5 MG tablet Commonly known as: COREG Take 1 tablet by mouth 2 times daily with a meal.   furosemide 20 MG tablet Commonly known as: LASIX Take 1 tablet by mouth as  needed for fluid or edema.   isosorbide mononitrate 120 MG 24 hr tablet Commonly known as: IMDUR Take 1 tablet by mouth daily.   metFORMIN 500 MG tablet Commonly known as: Glucophage Take 2 tablets (1,000 mg total) by mouth 2 (two) times daily with a meal.   nitroGLYCERIN 0.4 MG SL tablet Commonly known as: NITROSTAT Place 1 tablet (0.4 mg total) under the tongue every 5 (five) minutes as needed for chest pain.   omeprazole 20 MG capsule Commonly known as: PRILOSEC Take 1 capsule (20 mg total) by mouth daily as needed ([place]).   Pentips 32G X 4 MM Misc Generic drug: Insulin Pen Needle Use as directed with insulin pen.               Durable Medical Equipment  (From admission, onward)           Start     Ordered   12/28/21 1005  For home use only DME 4 wheeled rolling walker with seat  Once       Question:  Patient needs a walker to treat with the following condition  Answer:  General weakness   12/28/21 1005            Disposition and follow-up:   [name] was discharged from [facility] in Stable condition.  At the hospital follow up visit please address:  1.  Follow-up:  a. Chest pain - due to medication non-adherence. Workup otherwise negative.  not been able to refill medications due to living situation and financial circumstances. Cardiology evaluated and will plan for outpatient follow up.  She has a cardiologist in [place] which she can see if she goes back there to live with son. Discharged to [facility] shelter in meantime.     b. HFrEF Entresto and Arlyce Harman held due to euvolemia and poor renal function. Reassess necessity of these medicaitons. Needs outpatient PT.   c. CKD 4 - Entresto and Arlyce Harman held on admission   d. DM - restarted home insulin 26 units on discharge  2.  Labs / imaging needed at time of follow-up: cbc, bmp, EKG  3.  Pending labs/ test needing follow-up: none  4.  Medication Changes  Started:  none  Stopped: entresto, spiro  Follow-up Appointments:  Follow-up Information     [name], MD Follow up.   Specialty: Cardiology Why: The office should contact you within 1-2 business days to arrange follow-up. If you do not hear from them within this time frame, please contact the office. Contact information: [address] [address] [phone]         [facility] Follow up.   Specialty: Rehabilitation Why: Office to call with visit times-if the office has not called within 3-5 business days please call the office. Contact information: [address] [phone] mc [place] Hospital Course by problem list:  CP/tightness,SOB - patient presented with chest pain/tightness after getting in argument with daughter. EKG did not show acute infarction. Troponin trending down. LDL 121. On imdur, carvedilol, lipitor and ASA but has not been able to refill medications due to living situation and financial circumstances. Cardiology evaluated and will plan for outpatient follow up. Her Delene Loll and Arlyce Harman were held given poor renal function. She remained stable without recurrence of symptoms on imdur, carvedilol, ASA and atorvastatin. Cardiology scheduled outpatient follow up. TOC brought medications to bedside prior to discharge to shelter. PT evaluated and recommended outpatient PT.  Discharge Subjective: Patient states that she spoke with her sons yesterday about her living situation but that they are still discussing this with her wives. She states that she is contacting a member of her church for resources on housing. Discussed that the patient appears to be medically cleared. Discussed plan to hopefully discharge today. Discussed plan to have provide the patient with social works options for her housing. Patient agrees with the plan.   Discharge Exam:    BP (!) 150/82 (BP Location: Left Arm)   Pulse 68   Temp 98 F (36.7 C) (Oral)   Resp 18   SpO2 96%  Constitutional: alert, in no acute distress  HENT: normocephalic atraumatic Neck: supple Cardiovascular: regular rate and rhythm, no LE edema,  Pulmonary/Chest: normal work of breathing on room air, lungs clear to auscultation bilaterally MSK: normal bulk and tone Neurological: alert & oriented x 3 Skin: warm and dry Psych: mood and affect appropriate  Pertinent Labs, Studies, and Procedures:     Latest Ref Rng & Units 12/23/2021   10:15 PM 11/11/2020   12:28 AM 11/10/2020    3:36 AM  CBC  WBC 4.0 - 10.5 K/uL 5.4  6.1  6.1   Hemoglobin 12.0 - 15.0 g/dL 12.0  10.6  10.6   Hematocrit 36.0 -  46.0 % 38.0  33.0  32.5   Platelets 150 - 400 K/uL 233  229  216        Latest Ref Rng & Units 12/27/2021    8:06 AM 12/26/2021    7:24 AM 12/25/2021    7:32 AM  CMP  Glucose 70 - 99 mg/dL 98  143  131   BUN 8 - 23 mg/dL 37  38  32   Creatinine 0.44 - 1.00 mg/dL 1.88  1.91  1.96   Sodium 135 - 145 mmol/L 142  139  140   Potassium 3.5 - 5.1 mmol/L 4.5  4.1  4.0   Chloride 98 - 111 mmol/L 110  110  111   CO2 22 - 32 mmol/L '23  21  21   ' Calcium 8.9 - 10.3 mg/dL 8.9  8.7  8.8     DG Chest 2 View  Result Date: 12/23/2021 CLINICAL DATA:  Dyspnea EXAM: CHEST - 2 VIEW COMPARISON:  08/02/2020 FINDINGS: The lungs are symmetrically well expanded. Stable parenchymal scarring within the left mid lung zone. Discoid atelectasis within the right middle lobe. No superimposed confluent pulmonary infiltrate. No pneumothorax or pleural effusion. Cardiac size is within normal limits. Pulmonary vascularity is normal. No acute bone abnormality. IMPRESSION: No evidence of acute cardiopulmonary disease. Electronically Signed   By: Fidela Salisbury M.D.   On: 12/23/2021 22:56     Discharge Instructions: Discharge Instructions     Ambulatory referral to Physical Therapy   Complete by: As directed    Outpatient  Physical Therapy- evaluation and treatment.   Call MD for:  difficulty breathing, headache or visual disturbances   Complete by: As directed    Call MD for:  extreme fatigue   Complete by: As directed    Call MD for:  hives   Complete by: As directed    Call MD for:  persistant dizziness or light-headedness   Complete by: As directed    Call MD for:  persistant nausea and vomiting   Complete by: As directed    Call MD for:  redness, tenderness, or signs of infection (pain, swelling, redness, odor or green/yellow discharge around incision site)   Complete by: As directed    Call MD for:  severe uncontrolled pain   Complete by: As directed    Call MD for:  temperature >100.4   Complete by: As directed    Diet - low sodium heart healthy   Complete by: As directed    Increase activity slowly   Complete by: As directed        Signed: Delene Ruffini, MD 12/28/2021, 4:36 PM   Pager: [phone]

## 2021-12-28 NOTE — TOC Progression Note (Signed)
Transition of Care [facility]) - Progression Note    Patient Details  Name: [name] MRN: [id number] Date of Birth: Dec 23, 1955  Transition of Care [facility]) CM/SW [place], [place] Phone Number: 12/28/2021, 10:53 AM  Clinical Narrative:      CSW spoke with patient at bedside. Patients plan is to dc to [facility] when medically ready for dc. Patient confirmed will need transportation at dc. CSW provided patients with area shelter resources. Patient accepted. Patient reports if unable to find shelter tonight, then she plans on staying with a church friend. CSW informed MD. All questions answered no further questions reported at this time.      Expected Discharge Plan and Services                                                 Social Determinants of Health (SDOH) Interventions    Readmission Risk Interventions    08/03/2020    3:05 PM 06/12/2020    2:37 PM  Readmission Risk Prevention Plan  Transportation Screening Complete Complete  PCP or Specialist Appt within 5-7 Days  Complete  PCP or Specialist Appt within 3-5 Days Complete   Home Care Screening  Complete  Medication Review (RN CM)  Complete  HRI or Home Care Consult Complete   Social Work Consult for [place] Planning/Counseling Complete   Palliative Care Screening Not Applicable   Medication Review Press photographer) Complete

## 2021-12-29 NOTE — Telephone Encounter (Signed)
**Note De-Identified Kevyn Wengert Obfuscation** Transition Care Management Unsuccessful Follow-up Telephone Call  Date of discharge and from where:  12/28/2021 from [facility]  Attempts:  1st Attempt  Reason for unsuccessful TCM follow-up call:  Left voice message

## 2021-12-30 NOTE — Telephone Encounter (Signed)
**Note De-Identified Breslin Burklow Obfuscation** Transition Care Management Unsuccessful Follow-up Telephone Call  Date of discharge and from where:  12/28/2021 from [facility]  Attempts:  2nd Attempt  Reason for unsuccessful TCM follow-up call:  Left voice message

## 2021-12-30 NOTE — Progress Notes (Deleted)
Office Visit    Patient Name: [name] Date of Encounter: 12/30/2021  Primary Care Provider:  Pcp, No Primary Cardiologist:  Sinclair Grooms, MD Primary Electrophysiologist: None  Chief Complaint    [name] is a 66 y.o. female with PMH of  hypertension, hyperlipidemia, bilateral nonobstructive carotid artery stenosis, chronic combined systolic and diastolic CHF, COPD, CKD stage IV, CAD s/p PCI of mid LAD, LBBB DM 2, CVA 11/18/2020, and PVD who presents today for hospital follow-up of chest pain.  Past Medical History    Past Medical History:  Diagnosis Date   Anemia 07/17/2013   Arthritis    "hands, shoulders, hips, legs" (05/10/2018)   Arthritis of left hip 08/18/2013   S/p total hip arthroplasty.     CHF (congestive heart failure) ([place])    2014...@ [facility]   Chronic lower back pain    CKD (chronic kidney disease) stage 3, GFR 30-59 ml/min (HCC)    COPD (chronic obstructive pulmonary disease) ([place])    COVID-19 04/13/2020   High cholesterol    Hypertension    Hypertensive emergency 08/02/2020   Incomplete uterine prolapse 11/23/2018   [facility] 02/27/20- pessary removed per patient request, considering possible surgery, observing for now   Influenza, pneumonia 06/01/2017   Mild depression 04/11/2018   04/11/2018 - PHQ-9 8, currently situational depression    PVD (peripheral vascular disease) ([place])    Type II diabetes mellitus ([place])    diagnosed 2001   Past Surgical History:  Procedure Laterality Date   ABDOMINAL ANGIOGRAM  12/28/2012   ABDOMINAL ANGIOGRAM N/A 12/29/2011   Procedure: ABDOMINAL ANGIOGRAM;  Surgeon: Conrad Star, MD;  Location: [facility] CATH LAB;  Service: Cardiovascular;  Laterality: N/A;   ABDOMINAL AORTAGRAM N/A 12/21/2012   Procedure: ABDOMINAL Maxcine Ham;  Surgeon: Elam Dutch, MD;  Location: [facility] CATH LAB;  Service: Cardiovascular;  Laterality: N/A;   CORONARY STENT INTERVENTION N/A 06/11/2020   Procedure: CORONARY STENT INTERVENTION;   Surgeon: [name], MD;  Location:  CV LAB;  Service: Cardiovascular;  Laterality: N/A;   EYE SURGERY Bilateral ~ 2018   "laser; related to my diabetes"   JOINT REPLACEMENT     PERIPHERAL VASCULAR CATHETERIZATION     "had to have some veins unclogged cause I was in pain when I walked" (05/10/2018)   RIGHT/LEFT HEART CATH AND CORONARY ANGIOGRAPHY N/A 04/30/2018   Procedure: RIGHT/LEFT HEART CATH AND CORONARY ANGIOGRAPHY;  Surgeon: [name], MD;  Location: [place] CV LAB;  Service: Cardiovascular;  Laterality: N/A;   RIGHT/LEFT HEART CATH AND CORONARY ANGIOGRAPHY N/A 06/09/2020   Procedure: RIGHT/LEFT HEART CATH AND CORONARY ANGIOGRAPHY;  Surgeon: [name], MD;  Location: [place] CV LAB;  Service: Cardiovascular;  Laterality: N/A;   TOTAL HIP ARTHROPLASTY Left 08/19/2013   Procedure: TOTAL HIP ARTHROPLASTY;  Surgeon: Kerin Salen, MD;  Location: [place];  Service: Orthopedics;  Laterality: Left;   TUBAL LIGATION      Allergies  Allergies  Allergen Reactions   Lisinopril Cough   Plavix [Clopidogrel Bisulfate] Rash    History of Present Illness    [name] is a 66 year old female with the above-mentioned past medical history who presents today for post hospital follow-up of chest pain.  [name] has been followed and seen by our group since 2013.  She had a 2D echo completed in 2015 that showed EF of 30-35%.  Repeat 2D echo in 2018 showed recovered EF of  greater than 50%.  She underwent R/LHC in 2019 that showed diffuse multivessel disease that was medically managed with future plan for CVTS referral if symptoms not controlled.  She was admitted on 06/2020 with NSTEMI and repeat cath showed severe three-vessel CAD and total occlusion of proximal mid RCA.  CVTS was consulted but felt patient was not a good surgical candidate.  She underwent PCI of the mid LAD and was started on DAPT.  2D echo was completed that showed EF of 25-30%. In 10/2020 patient suffered  right MCA CVA and was treated with tPA.  She was previously followed for right carotid stenosis by [facility].  She most recently presented to [facility] on 12/24/2021 with complaint of chest pain.  Patient is currently homeless and has been out of all of her medications for months.  EKG completed in the ED with known LBBB otherwise no acute findings. BNP of 491 with trending troponin of 47-50. 2D echo completed with EF of 35-40% secondarily to being off of medications.  She was chest pain-free and patient was discharged with instructions to follow-up with outpatient cardiology.  Since last being seen in the office patient reports***.  Patient denies chest pain, palpitations, dyspnea, PND, orthopnea, nausea, vomiting, dizziness, syncope, edema, weight gain, or early satiety.  ***Notes: Patient was recently homeless living in daughter's car -She has been noncompliant with medications due to living situation  Home Medications    Current Outpatient Medications  Medication Sig Dispense Refill   acetaminophen (TYLENOL) 500 MG tablet Take 1,000 mg by mouth every 6 (six) hours as needed for mild pain, fever or headache.     aspirin EC 81 MG tablet Take 1 tablet (81 mg total) by mouth daily. Swallow whole. 30 tablet 11   atorvastatin (LIPITOR) 80 MG tablet Take 1 tablet by mouth daily. 30 tablet 0   blood glucose meter kit and supplies KIT Use up to four times daily as directed. 1 each 0   carvedilol (COREG) 12.5 MG tablet Take 1 tablet by mouth 2 times daily with a meal. 60 tablet 0   Fingerstix Lancets MISC use as directed to check blood sugar up to 4 times daily 100 each 0   furosemide (LASIX) 20 MG tablet Take 1 tablet by mouth as needed for fluid or edema. 30 tablet 3   glucose blood test strip Use to check sugar as directed up to 4 times daily. 100 each 0   Insulin Glargine (BASAGLAR KWIKPEN) 100 UNIT/ML Inject 26 Units into the skin at bedtime. 9 mL 0   Insulin Pen Needle (PEN NEEDLES) 32G X 4 MM MISC  Use as directed with insulin pen. 100 each 0   isosorbide mononitrate (IMDUR) 120 MG 24 hr tablet Take 1 tablet by mouth daily. 90 tablet 3   metFORMIN (GLUCOPHAGE) 500 MG tablet Take 2 tablets (1,000 mg total) by mouth 2 (two) times daily with a meal. 120 tablet 0   nitroGLYCERIN (NITROSTAT) 0.4 MG SL tablet Place 1 tablet (0.4 mg total) under the tongue every 5 (five) minutes as needed for chest pain. 25 tablet 0   omeprazole (PRILOSEC) 20 MG capsule Take 1 capsule (20 mg total) by mouth daily as needed (hearburn). 30 capsule 0   No current facility-administered medications for this visit.     Review of Systems  Please see the history of present illness.    (+)*** (+)***  All other systems reviewed and are otherwise negative except as noted above.  Physical Exam  Wt Readings from Last 3 Encounters:  11/07/20 160 lb 11.5 oz (72.9 kg)  08/27/20 169 lb 6.4 oz (76.8 kg)  08/03/20 170 lb 10.2 oz (77.4 kg)   BB:[id number] were no vitals filed for this visit.,There is no height or weight on file to calculate BMI.  Constitutional:      Appearance: Healthy appearance. Not in distress.  Neck:     Vascular: JVD normal.  Pulmonary:     Effort: Pulmonary effort is normal.     Breath sounds: No wheezing. No rales. Diminished in the bases Cardiovascular:     Normal rate. Regular rhythm. Normal S1. Normal S2.      Murmurs: There is no murmur.  Edema:    Peripheral edema absent.  Abdominal:     Palpations: Abdomen is soft non tender. There is no hepatomegaly.  Skin:    General: Skin is warm and dry.  Neurological:     General: No focal deficit present.     Mental Status: Alert and oriented to person, place and time.     Cranial Nerves: Cranial nerves are intact.  EKG/LABS/Other Studies Reviewed    ECG personally reviewed by me today - ***  Risk Assessment/Calculations:   {Does this patient have ATRIAL FIBRILLATION?:[phone]}        Lab Results  Component Value Date   WBC 5.4  12/23/2021   HGB 12.0 12/23/2021   HCT 38.0 12/23/2021   MCV 93.4 12/23/2021   PLT 233 12/23/2021   Lab Results  Component Value Date   CREATININE 1.88 (H) 12/27/2021   BUN 37 (H) 12/27/2021   NA 142 12/27/2021   K 4.5 12/27/2021   CL 110 12/27/2021   CO2 23 12/27/2021   Lab Results  Component Value Date   ALT 15 11/07/2020   AST 25 11/07/2020   ALKPHOS 90 11/07/2020   BILITOT 0.5 11/07/2020   Lab Results  Component Value Date   CHOL 182 12/25/2021   HDL 48 12/25/2021   LDLCALC 121 (H) 12/25/2021   TRIG 67 12/25/2021   CHOLHDL 3.8 12/25/2021    Lab Results  Component Value Date   HGBA1C 8.8 (H) 12/24/2021    Assessment & Plan    1.  Atherosclerotic coronary artery disease: -Recent chest pain likely related to stress and current living situation -Continue Lipitor 80, carvedilol 3.125 mg twice daily, ASA 81 mg, Imdur 30 mg  2.  Combined systolic/diastolic CHF: -2D echo completed showing EF of 35-40% -Entresto and spironolactone  3.  HTN: -Blood pressure today was***   4.  PAD: -She was previously followed by [facility] with history of subintimal angioplasty of the left femoral-popliteal artery in 2013 -Continue Lipitor and ASA  5.  History of CVA: -CVA in 10/2020 -Continue ASA and Lipitor 80 mg      Disposition: Follow-up with [name] III, MD or APP in *** months {Are you ordering a CV Procedure (e.g. stress test, cath, DCCV, TEE, etc)?   Press F2        :[id number]}   Medication Adjustments/Labs and Tests Ordered: Current medicines are reviewed at length with the patient today.  Concerns regarding medicines are outlined above.   Signed, Mable Fill, Marissa Nestle, NP 12/30/2021, 10:49 AM [place]

## 2021-12-31 NOTE — Telephone Encounter (Signed)
**Note De-Identified Neelie Welshans Obfuscation** Transition Care Management Unsuccessful Follow-up Telephone Call  Date of discharge and from where:   12/28/2021 from [facility]  Attempts:  3rd Attempt  Reason for unsuccessful TCM follow-up call:  Left voice message

## 2022-01-04 ENCOUNTER — Ambulatory Visit: Payer: Self-pay | Admitting: Nurse Practitioner

## 2022-01-05 NOTE — Progress Notes (Deleted)
 CC: Hospital Follow up  HPI:  [name] is a 66 y.o. with medical history of HTN, HLD, CHF, CKDIIIb and GERD presenting [facility] for [facility] follow up.   Please see problem-based list for further details, assessments, and plans.  Past Medical History:  Diagnosis Date   Anemia 07/17/2013   Arthritis    "hands, shoulders, hips, legs" (05/10/2018)   Arthritis of left hip 08/18/2013   S/p total hip arthroplasty.     CHF (congestive heart failure) (HCC)    2014...@ [facility]   Chronic lower back pain    CKD (chronic kidney disease) stage 3, GFR 30-59 ml/min (HCC)    COPD (chronic obstructive pulmonary disease) (HCC)    COVID-19 04/13/2020   High cholesterol    Hypertension    Hypertensive emergency 08/02/2020   Incomplete uterine prolapse 11/23/2018   [facility] 02/27/20- pessary removed per patient request, considering possible surgery, observing for now   Influenza, pneumonia 06/01/2017   Mild depression 04/11/2018   04/11/2018 - PHQ-9 8, currently situational depression    PVD (peripheral vascular disease) (HCC)    Type II diabetes mellitus (HCC)    diagnosed 2001    Current Outpatient Medications (Endocrine & Metabolic):    Insulin Glargine (BASAGLAR KWIKPEN) 100 UNIT/ML, Inject 26 Units into the skin at bedtime.   metFORMIN (GLUCOPHAGE) 500 MG tablet, Take 2 tablets (1,000 mg total) by mouth 2 (two) times daily with a meal.  Current Outpatient Medications (Cardiovascular):    atorvastatin (LIPITOR) 80 MG tablet, Take 1 tablet by mouth daily.   carvedilol (COREG) 12.5 MG tablet, Take 1 tablet by mouth 2 times daily with a meal.   furosemide (LASIX) 20 MG tablet, Take 1 tablet by mouth as needed for fluid or edema.   isosorbide mononitrate (IMDUR) 120 MG 24 hr tablet, Take 1 tablet by mouth daily.   nitroGLYCERIN (NITROSTAT) 0.4 MG SL tablet, Place 1 tablet (0.4 mg total) under the tongue every 5 (five) minutes as needed for chest pain.   Current Outpatient  Medications (Analgesics):    acetaminophen (TYLENOL) 500 MG tablet, Take 1,000 mg by mouth every 6 (six) hours as needed for mild pain, fever or headache.   aspirin EC 81 MG tablet, Take 1 tablet (81 mg total) by mouth daily. Swallow whole.   Current Outpatient Medications (Other):    blood glucose meter kit and supplies KIT, Use up to four times daily as directed.   Fingerstix Lancets MISC, use as directed to check blood sugar up to 4 times daily   glucose blood test strip, Use to check sugar as directed up to 4 times daily.   Insulin Pen Needle (PEN NEEDLES) 32G X 4 MM MISC, Use as directed with insulin pen.   omeprazole (PRILOSEC) 20 MG capsule, Take 1 capsule (20 mg total) by mouth daily as needed (hearburn).  Review of Systems:  Review of system negative unless stated in the problem list or HPI.    Physical Exam:  There were no vitals filed for this visit.  Physical Exam General: NAD HENT: NCAT Lungs: CTAB, no wheeze, rhonchi or rales.  Cardiovascular: Normal heart sounds, no r/m/g, 2+ pulses in all extremities. No LE edema Abdomen: No TTP, normal bowel sounds MSK: No asymmetry or muscle atrophy.  Skin: no lesions noted on exposed skin Neuro: Alert and oriented x4. CN grossly intact Psych: Normal mood and normal affect   Assessment & Plan:   No problem-specific Assessment & Plan notes found   for this encounter.   See Encounters Tab for problem based charting.  Patient discussed with Dr. {NAMES:[id number]::"Guilloud","Hoffman","Mullen","Narendra","Vincent","Machen","Lau","Hatcher"} Alyssa Khan, [name]. [facility] Internal Medicine Residency, PGY-2    1.  Follow-up:             a. Chest pain - due to medication non-adherence. Workup otherwise negative.  not been able to refill medications due to living situation and financial circumstances. Cardiology evaluated and will plan for outpatient follow up.  She has a cardiologist in [place] which she can see  if she goes back there to live with son. [facility].                           b. HFrEF Entresto and Spiro held due to euvolemia and poor renal function. Reassess necessity of these medicaitons. Needs outpatient PT.               c. CKD 4 - Entresto and Spiro held on admission               d. DM - restarted home insulin 26 units on discharge   2.  Labs / imaging needed at time of follow-up: cbc, bmp, EKG 

## 2022-01-06 ENCOUNTER — Encounter: Payer: Self-pay | Admitting: Internal Medicine

## 2022-01-17 NOTE — Progress Notes (Deleted)
CC: follow up  HPI:  [name] is a 66 y.o. with medical history of HTN, HLD, CAD, DMII, CKDIIIa, OA presenting to [facility] for follow up.   Please see problem-based list for further details, assessments, and plans.  Past Medical History:  Diagnosis Date   Anemia 07/17/2013   Arthritis    "hands, shoulders, hips, legs" (05/10/2018)   Arthritis of left hip 08/18/2013   S/p total hip arthroplasty.     CHF (congestive heart failure) ([place])    2014...@ [facility]   Chronic lower back pain    CKD (chronic kidney disease) stage 3, GFR 30-59 ml/min (HCC)    COPD (chronic obstructive pulmonary disease) ([place])    COVID-19 04/13/2020   High cholesterol    Hypertension    Hypertensive emergency 08/02/2020   Incomplete uterine prolapse 11/23/2018   [facility] 02/27/20- pessary removed per patient request, considering possible surgery, observing for now   Influenza, pneumonia 06/01/2017   Mild depression 04/11/2018   04/11/2018 - PHQ-9 8, currently situational depression    PVD (peripheral vascular disease) ([place])    Type II diabetes mellitus ([place])    diagnosed 2001    Current Outpatient Medications (Endocrine & Metabolic):    Insulin Glargine (BASAGLAR KWIKPEN) 100 UNIT/ML, Inject 26 Units into the skin at bedtime.   metFORMIN (GLUCOPHAGE) 500 MG tablet, Take 2 tablets (1,000 mg total) by mouth 2 (two) times daily with a meal.  Current Outpatient Medications (Cardiovascular):    atorvastatin (LIPITOR) 80 MG tablet, Take 1 tablet by mouth daily.   carvedilol (COREG) 12.5 MG tablet, Take 1 tablet by mouth 2 times daily with a meal.   furosemide (LASIX) 20 MG tablet, Take 1 tablet by mouth as needed for fluid or edema.   isosorbide mononitrate (IMDUR) 120 MG 24 hr tablet, Take 1 tablet by mouth daily.   nitroGLYCERIN (NITROSTAT) 0.4 MG SL tablet, Place 1 tablet (0.4 mg total) under the tongue every 5 (five) minutes as needed for chest pain.   Current Outpatient Medications  (Analgesics):    acetaminophen (TYLENOL) 500 MG tablet, Take 1,000 mg by mouth every 6 (six) hours as needed for mild pain, fever or headache.   aspirin EC 81 MG tablet, Take 1 tablet (81 mg total) by mouth daily. Swallow whole.   Current Outpatient Medications (Other):    blood glucose meter kit and supplies KIT, Use up to four times daily as directed.   Fingerstix Lancets MISC, use as directed to check blood sugar up to 4 times daily   glucose blood test strip, Use to check sugar as directed up to 4 times daily.   Insulin Pen Needle (PEN NEEDLES) 32G X 4 MM MISC, Use as directed with insulin pen.   omeprazole (PRILOSEC) 20 MG capsule, Take 1 capsule (20 mg total) by mouth daily as needed (hearburn).  Review of Systems:  Review of system negative unless stated in the problem list or HPI.    Physical Exam:  There were no vitals filed for this visit.  Physical Exam General: NAD HENT: NCAT Lungs: CTAB, no wheeze, rhonchi or rales.  Cardiovascular: Normal heart sounds, no r/m/g, 2+ pulses in all extremities. No LE edema Abdomen: No TTP, normal bowel sounds MSK: No asymmetry or muscle atrophy.  Skin: no lesions noted on exposed skin Neuro: Alert and oriented x4. CN grossly intact Psych: Normal mood and normal affect   Assessment & Plan:   No problem-specific Assessment & Plan notes found for this  encounter.   See Encounters Tab for problem based charting.  Patient {GC/GE:[id number]::"discussed with","seen with"} Dr. {NAMES:[id number]::"Guilloud","Hoffman","Mullen","Narendra","Vincent","Machen","Lau","Hatcher"} Alyssa Schuller, MD [place]ie [place]. [facility] Internal Medicine Residency, PGY-2

## 2022-01-18 ENCOUNTER — Encounter: Payer: Self-pay | Admitting: Internal Medicine

## 2022-02-07 IMAGING — DX DG CHEST 1V PORT
1 series · 1 of 1 positions shown · non-contrast
Comparison: PA chest radiograph 06/06/2020 and earlier.

CLINICAL DATA: 64-year-old female with shortness of breath, poor
oxygenation on room air. Diaphoresis.

EXAM:
PORTABLE CHEST 1 VIEW

[chest ap]
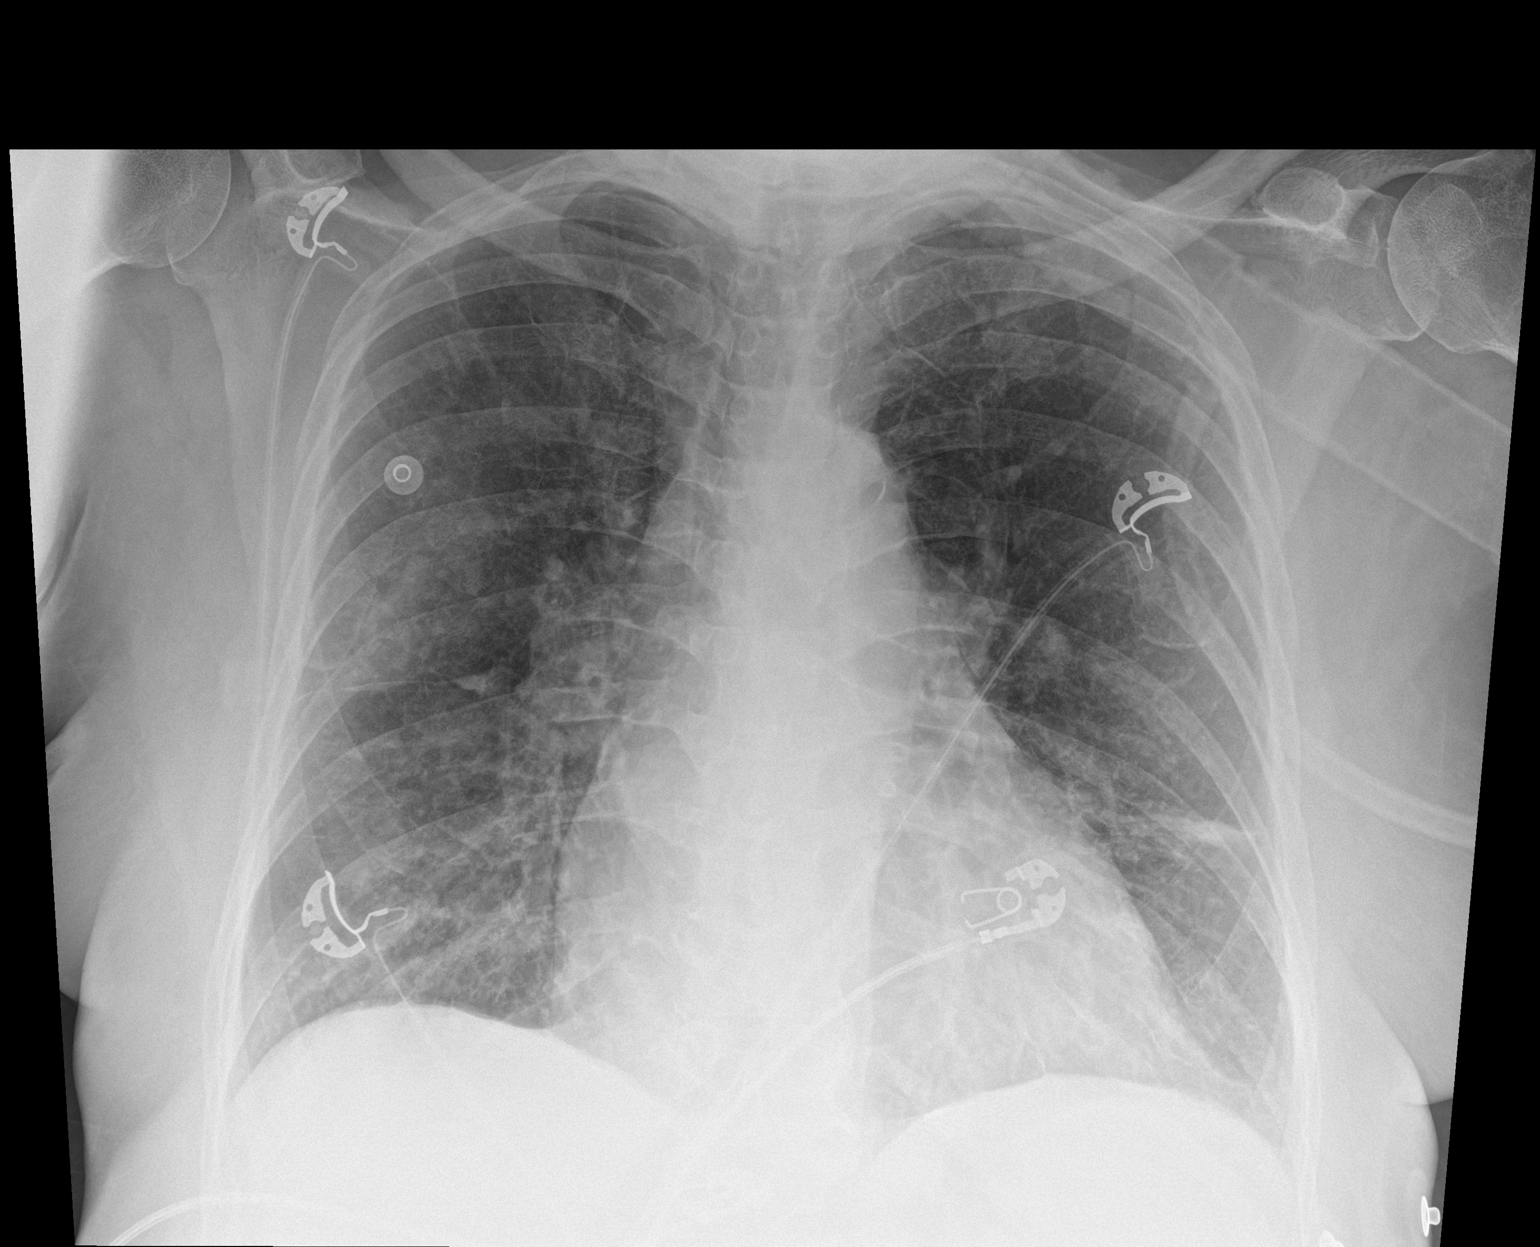

[1 of 1 positions shown; findings below may reference images not displayed]

FINDINGS: Portable AP upright view at 9762 hours. Stable lung volumes.
Mediastinal contours are stable and within normal limits.
Curvilinear scarring or atelectasis in the left mid lung is chronic
and stable. No superimposed pneumothorax. Pulmonary vascularity
appears stable and within normal limits. No pleural effusion or
acute pulmonary opacity identified. Visualized tracheal air column
is within normal limits. No acute osseous abnormality identified.
IMPRESSION: No acute cardiopulmonary abnormality on portable chest. Chronic left
mid lung scarring/atelectasis.

## 2024-02-15 ENCOUNTER — Other Ambulatory Visit (HOSPITAL_COMMUNITY): Payer: Self-pay

## 2024-06-02 DEATH — deceased
